# Patient Record
Sex: Male | Born: 1940 | Race: White | Hispanic: No | Marital: Married | Smoking: Former smoker
Health system: Southern US, Community
[De-identification: ages and names within clinical notes are randomized; demographics above are authoritative.]

## PROBLEM LIST (undated history)

## (undated) DIAGNOSIS — J3489 Other specified disorders of nose and nasal sinuses: Secondary | ICD-10-CM

## (undated) DIAGNOSIS — I639 Cerebral infarction, unspecified: Secondary | ICD-10-CM

## (undated) DIAGNOSIS — E119 Type 2 diabetes mellitus without complications: Secondary | ICD-10-CM

## (undated) DIAGNOSIS — H9190 Unspecified hearing loss, unspecified ear: Secondary | ICD-10-CM

## (undated) DIAGNOSIS — M75100 Unspecified rotator cuff tear or rupture of unspecified shoulder, not specified as traumatic: Secondary | ICD-10-CM

## (undated) DIAGNOSIS — I10 Essential (primary) hypertension: Secondary | ICD-10-CM

## (undated) DIAGNOSIS — I779 Disorder of arteries and arterioles, unspecified: Secondary | ICD-10-CM

## (undated) DIAGNOSIS — Z950 Presence of cardiac pacemaker: Secondary | ICD-10-CM

## (undated) DIAGNOSIS — D649 Anemia, unspecified: Secondary | ICD-10-CM

## (undated) DIAGNOSIS — H53453 Other localized visual field defect, bilateral: Secondary | ICD-10-CM

## (undated) DIAGNOSIS — I509 Heart failure, unspecified: Secondary | ICD-10-CM

## (undated) DIAGNOSIS — M1990 Unspecified osteoarthritis, unspecified site: Secondary | ICD-10-CM

## (undated) DIAGNOSIS — R351 Nocturia: Secondary | ICD-10-CM

## (undated) DIAGNOSIS — E785 Hyperlipidemia, unspecified: Secondary | ICD-10-CM

## (undated) DIAGNOSIS — I2510 Atherosclerotic heart disease of native coronary artery without angina pectoris: Secondary | ICD-10-CM

## (undated) DIAGNOSIS — I4821 Permanent atrial fibrillation: Secondary | ICD-10-CM

## (undated) DIAGNOSIS — E669 Obesity, unspecified: Secondary | ICD-10-CM

## (undated) DIAGNOSIS — I350 Nonrheumatic aortic (valve) stenosis: Secondary | ICD-10-CM

## (undated) DIAGNOSIS — I739 Peripheral vascular disease, unspecified: Secondary | ICD-10-CM

## (undated) DIAGNOSIS — R350 Frequency of micturition: Secondary | ICD-10-CM

## (undated) HISTORY — DX: Peripheral vascular disease, unspecified: I73.9

## (undated) HISTORY — DX: Unspecified rotator cuff tear or rupture of unspecified shoulder, not specified as traumatic: M75.100

## (undated) HISTORY — PX: LUMBAR DISC SURGERY: SHX700

## (undated) HISTORY — PX: OTHER SURGICAL HISTORY: SHX169

## (undated) HISTORY — DX: Disorder of arteries and arterioles, unspecified: I77.9

## (undated) HISTORY — DX: Cerebral infarction, unspecified: I63.9

## (undated) HISTORY — DX: Other localized visual field defect, bilateral: H53.453

## (undated) HISTORY — DX: Unspecified osteoarthritis, unspecified site: M19.90

## (undated) HISTORY — PX: CARPAL TUNNEL RELEASE: SHX101

## (undated) HISTORY — PX: BACK SURGERY: SHX140

## (undated) HISTORY — DX: Hyperlipidemia, unspecified: E78.5

## (undated) HISTORY — DX: Obesity, unspecified: E66.9

## (undated) HISTORY — DX: Unspecified hearing loss, unspecified ear: H91.90

## (undated) HISTORY — DX: Essential (primary) hypertension: I10

## (undated) HISTORY — DX: Atherosclerotic heart disease of native coronary artery without angina pectoris: I25.10

---

## 2003-03-25 HISTORY — PX: CORONARY ARTERY BYPASS GRAFT: SHX141

## 2005-11-29 ENCOUNTER — Inpatient Hospital Stay (HOSPITAL_BASED_OUTPATIENT_CLINIC_OR_DEPARTMENT_OTHER): Admission: RE | Admit: 2005-11-29 | Discharge: 2005-11-29 | Payer: Self-pay | Admitting: Cardiology

## 2005-11-29 HISTORY — PX: CARDIAC CATHETERIZATION: SHX172

## 2006-12-18 ENCOUNTER — Ambulatory Visit: Payer: Self-pay | Admitting: Vascular Surgery

## 2007-06-06 ENCOUNTER — Ambulatory Visit: Payer: Self-pay | Admitting: Vascular Surgery

## 2007-12-19 ENCOUNTER — Ambulatory Visit: Payer: Self-pay | Admitting: Vascular Surgery

## 2007-12-26 ENCOUNTER — Ambulatory Visit: Payer: Self-pay | Admitting: Vascular Surgery

## 2008-01-29 ENCOUNTER — Inpatient Hospital Stay (HOSPITAL_COMMUNITY): Admission: RE | Admit: 2008-01-29 | Discharge: 2008-01-30 | Payer: Self-pay | Admitting: Vascular Surgery

## 2008-01-29 ENCOUNTER — Ambulatory Visit: Payer: Self-pay | Admitting: Vascular Surgery

## 2008-01-29 ENCOUNTER — Encounter: Payer: Self-pay | Admitting: Vascular Surgery

## 2008-01-29 HISTORY — PX: CAROTID ENDARTERECTOMY: SUR193

## 2008-02-20 ENCOUNTER — Ambulatory Visit: Payer: Self-pay | Admitting: Vascular Surgery

## 2008-08-20 ENCOUNTER — Ambulatory Visit: Payer: Self-pay | Admitting: Vascular Surgery

## 2009-02-11 ENCOUNTER — Ambulatory Visit: Payer: Self-pay | Admitting: Vascular Surgery

## 2009-09-23 ENCOUNTER — Ambulatory Visit: Payer: Self-pay | Admitting: Vascular Surgery

## 2010-01-05 ENCOUNTER — Ambulatory Visit: Payer: Self-pay | Admitting: Cardiology

## 2010-01-24 DIAGNOSIS — M75100 Unspecified rotator cuff tear or rupture of unspecified shoulder, not specified as traumatic: Secondary | ICD-10-CM

## 2010-01-24 HISTORY — DX: Unspecified rotator cuff tear or rupture of unspecified shoulder, not specified as traumatic: M75.100

## 2010-03-18 ENCOUNTER — Other Ambulatory Visit (INDEPENDENT_AMBULATORY_CARE_PROVIDER_SITE_OTHER): Payer: Medicare Other

## 2010-03-18 ENCOUNTER — Ambulatory Visit (INDEPENDENT_AMBULATORY_CARE_PROVIDER_SITE_OTHER): Payer: Medicare Other | Admitting: Vascular Surgery

## 2010-03-18 DIAGNOSIS — Z48812 Encounter for surgical aftercare following surgery on the circulatory system: Secondary | ICD-10-CM

## 2010-03-18 DIAGNOSIS — I6529 Occlusion and stenosis of unspecified carotid artery: Secondary | ICD-10-CM

## 2010-03-19 NOTE — Assessment & Plan Note (Signed)
OFFICE VISIT  Evan [name], Evan [name] DOB:  09-03-1940                                       03/18/2010 CHART#:[id number]  The patient is a 70 year old male who returns for further followup after previous carotid endarterectomy.  His carotid was done in January of 2010.  He is currently on aspirin antiplatelet therapy.  CHRONIC MEDICAL PROBLEMS:  Also include elevated cholesterol, hypertension, diabetes and coronary artery disease.  He reports recently he has been having some polyuria and has been working on trying to get his diabetes under better control.  He denies any symptoms of TIA, amaurosis or stroke.  However, he states that sometimes when he is asked a question it takes him a few minutes to come up with an answer.  He denies any slurred speech.  PAST SURGICAL HISTORY:  Right carotid endarterectomy, coronary artery bypass grafting and back surgery.  SOCIAL HISTORY:  He is retired.  He is married.  He has 5 children.  He is a former smoker, quit in 1996.  He does not consume alcohol regularly.  FAMILY HISTORY:  His father had a heart attack.  REVIEW OF SYSTEMS:  GENERAL:  He is 6 feet 1 inch, 270 pounds. CARDIAC:  He has occasional chest pain, shortness of breath, palpitations.  MUSCULOSKELETAL:  He has multiple joint and arthritis pain. ENT:  He has had some decline in his hearing recently. URINARY:  He has some occasional polyuria and occasional frequency. Neurologic, pulmonary, vascular, hematologic, psychiatric, skin and GI review of systems are otherwise negative.  MEDICATIONS: 1. Include simvastatin 40 mg once a day. 2. Metoprolol 200 mg half tablet once a day. 3. Niaspan ER 500 mg q.h.s. 4. Quinapril 40 mg once a day. 5. Aspirin 81 mg once a day. 6. Replenix once daily. 7. Hydrochlorothiazide 12.5 once daily. 8. Claritin as needed. 9. Zicam as needed.  ALLERGIES:  He has no known drug allergies.  PHYSICAL EXAM:  Vital signs:  Blood  pressure is 146/76 in the right arm, 187/74 in the left arm, oxygen saturation is 98% on room air, heart rate 60 and regular.  HEENT:  Unremarkable.  Neck:  Has 2+ carotid pulses with a bruit on the left side.  Chest:  Clear to auscultation.  Cardiac: Regular rate and rhythm without murmur.  Abdomen:  Soft, nontender, nondistended, obese.  Musculoskeletal:  Showed no major joint deformities.  Neurologic:  Shows symmetric upper extremity and lower extremity motor strength which is 5/5.  Skin:  Has no open ulcers or rashes.  He had a carotid duplex exam performed today which shows a widely patent right carotid artery.  He has a 60%-80% left internal carotid artery. He has retrograde right vertebral artery flow.  He has antegrade left vertebral artery flow.  This suggests that he may have some element of right subclavian stenosis.  In summary, the patient has a moderate left carotid stenosis which again remains asymptomatic.  I believe we should continue to follow this with serial duplex exam.  He also has evidence of right subclavian stenosis which is also asymptomatic but probably he should always have the blood pressure measured in his left arm currently because this will be the most accurate reading.  Overall the patient is doing well.  Hopefully he will continue to not have progression of his peripheral arterial disease.  He will follow  up with [place] with a repeat carotid duplex exam in 6 months' time.    Evan [name]. Evan Matters, MD Electronically Signed  CEF/MEDQ  D:  03/18/2010  T:  03/19/2010  Job:  [id number]  cc:   Evan [name], M.D. Dr Evan [name]

## 2010-03-26 NOTE — Procedures (Unsigned)
CAROTID DUPLEX EXAM  INDICATION:  Carotid stenosis.  HISTORY: Diabetes:  No. Cardiac:  CABG. Hypertension:  Yes. Smoking:  Quit in 1996. Previous Surgery:  Right carotid endarterectomy, January 29, 2008. CV History:  Currently asymptomatic. Amaurosis Fugax No, Paresthesias No, Hemiparesis No.                                      RIGHT             LEFT Brachial systolic pressure:         125               142 Brachial Doppler waveforms:         Normal            Normal Vertebral direction of flow:        Retrograde        Antegrade DUPLEX VELOCITIES (cm/sec) CCA peak systolic                   101               101 ECA peak systolic                   118               205 ICA peak systolic                   85                236 ICA end diastolic                   30                72 PLAQUE MORPHOLOGY:                  Heterogenous      Calcific PLAQUE AMOUNT:                      Minimal           Moderate PLAQUE LOCATION:                    CCA               CCA, ICA, ECA  IMPRESSION: 1. Patent right carotid endarterectomy site with no evidence of     restenosis of the internal carotid artery. 2. Left internal carotid artery velocities suggest 60% to 79%     stenosis. 3. Retrograde right vertebral artery.  ___________________________________________ Janetta Hora Darrick Penna, MD  EM/MEDQ  D:  03/18/2010  T:  03/18/2010  Job:  [id number]

## 2010-05-10 LAB — CBC
Hemoglobin: 13.6 g/dL (ref 13.0–17.0)
MCHC: 32.6 g/dL (ref 30.0–36.0)
MCHC: 32.8 g/dL (ref 30.0–36.0)
MCV: 87.7 fL (ref 78.0–100.0)
MCV: 88.1 fL (ref 78.0–100.0)
Platelets: 178 10*3/uL (ref 150–400)
RBC: 3.84 MIL/uL — ABNORMAL LOW (ref 4.22–5.81)
RBC: 4.75 MIL/uL (ref 4.22–5.81)
WBC: 8.6 10*3/uL (ref 4.0–10.5)

## 2010-05-10 LAB — GLUCOSE, CAPILLARY
Glucose-Capillary: 141 mg/dL — ABNORMAL HIGH (ref 70–99)
Glucose-Capillary: 145 mg/dL — ABNORMAL HIGH (ref 70–99)

## 2010-05-10 LAB — BASIC METABOLIC PANEL
BUN: 17 mg/dL (ref 6–23)
CO2: 25 mEq/L (ref 19–32)
Calcium: 8.2 mg/dL — ABNORMAL LOW (ref 8.4–10.5)
Chloride: 102 mEq/L (ref 96–112)
Creatinine, Ser: 1.04 mg/dL (ref 0.4–1.5)
GFR calc Af Amer: >60 mL/min (ref >60)

## 2010-06-08 NOTE — Discharge Summary (Signed)
Evan [name], Evan [name] NO.:  [id number]   MEDICAL RECORD NO.:  [id number]          PATIENT TYPE:  INP   LOCATION:  3312                         FACILITY:  [facility]   PHYSICIAN:  Janetta Hora. Fields, MD  DATE OF BIRTH:  Jul 28, 1940   DATE OF ADMISSION:  01/29/2008  DATE OF DISCHARGE:  01/30/2008                               DISCHARGE SUMMARY   DISCHARGE DIAGNOSES:  1. Right internal carotid artery stenosis.  2. Hypertension.  3. Dyslipidemia.   PROCEDURE PERFORMED:  Right carotid endarterectomy with 10-French shunt  and Dacron patch angioplasty closure by Dr. Darrick Penna on January 29, 2008.   COMPLICATIONS:  None.   CONDITION AT DISCHARGE:  Stable improving.   DISCHARGE MEDICATIONS:  1. Simvastatin 40 mg p.o. daily.  2. Metoprolol 200 mg one-half tablet p.o. daily.  3. Niaspan ER 500 mg p.o. nightly.  4. Quinapril 40 mg p.o. daily.  5. Aspirin 81 mg p.o. daily.  6. Replenex one p.o. daily.  7. Hydrochlorothiazide 12.5 mg p.o. daily.  8. Claritin p.r.n.  9. Zicam p.r.n.  10.Zinc lozenges p.r.n.  11.He was given a prescription for Percocet 5/325 one p.o. q.4 h.      p.r.n., pain.   DISPOSITION:  He has been discharged home in stable condition with his  wounds healing well.  He has given careful instructions regarding the  care of his wounds.  He was given an appointment with Dr. Darrick Penna in the  next 2-3 weeks.  The office will make the arrangements for the visit.   BRIEF IDENTIFYING STATEMENT:  For complete details, please refer the  typed history and physical.   Briefly, this is a very pleasant 70 year old gentleman who was referred  to Dr. Darrick Penna for right internal carotid artery stenosis.  Dr. Darrick Penna  evaluated him and found him to be a suitable candidate for right carotid  endarterectomy.  He was informed of the risk and benefits of the  procedure and after careful consideration, elected to proceed with  surgery.   HOSPITAL COURSE:  Preoperative workup was  completed as an outpatient.  He was brought in through same-day surgery and underwent the  aforementioned right carotid endarterectomy.  For complete details,  please refer to the typed operative report.  The procedure was without  complications.  He was returned to the post-anesthesia care unit  extubated.  Following stabilization, he was transferred to a bed in a  surgical step-down unit.  He was observed  overnight.  The following morning, he was nonfocal.  He was desirous of  discharge.  His drainage output had diminished and negligible.  He did  have some elevated oxygen demand, which improved over the course of the  day and he was subsequently discharged home in stable condition.      Wilmon Arms, PA      Janetta Hora. Fields, MD  Electronically Signed    KEL/MEDQ  D:  01/30/2008  T:  01/30/2008  Job:  [id number]   cc:   Dr. Darrick Penna

## 2010-06-08 NOTE — Op Note (Signed)
Evan [name], Evan [name] NO.:  [id number]   MEDICAL RECORD NO.:  [id number]          PATIENT TYPE:  INP   LOCATION:  3312                         FACILITY:  [facility]   PHYSICIAN:  Janetta Hora. Fields, MD  DATE OF BIRTH:  08-12-1940   DATE OF PROCEDURE:  01/29/2008  DATE OF DISCHARGE:                               OPERATIVE REPORT   PROCEDURE:  Right carotid endarterectomy.   PREOPERATIVE DIAGNOSIS:  Right internal carotid artery stenosis.   POSTOPERATIVE DIAGNOSIS:  Right internal carotid artery stenosis.   ANESTHESIA:  General.   ASSISTANT:  Wilmon Arms, PA-C   FINDINGS:  1. Extensive atherosclerotic occlusive disease from right common      carotid artery up in the common carotid artery with 50% right      common carotid artery stenosis and greater than 80% right internal      carotid artery stenosis.  2. Dacron patch.  3. A 10-French shunt.   DESCRIPTION OF PROCEDURE:  After obtaining informed consent, the patient  was taken to the operating room.  The patient was placed in supine  position on the operating table.  After induction of general anesthesia  and endotracheal intubation, the patient's right neck and chest were  prepped and draped in usual sterile fashion.  An oblique incision was  made on the right side of the neck and carried down through subcutaneous  tissues down to the level of the platysma.  The platysma was incised  with full length of the incision.  Dissection was carried down along the  anterior border of the right sternocleidomastoid muscle.  This was  reflected laterally.  The dissection was then carried deeper down to the  level of the internal jugular vein.  This was dissected free along its  medial border and reflected laterally.  Common facial vein was  identified, dissected free circumferentially, and ligated and divided  between silk ties.  Common carotid artery was then identified and  dissected free circumferentially.  It was  slightly thickened in  character.  Dissection was carried up to the level of carotid  bifurcation.  There was thickened plaque at the bifurcation.  The  external carotid artery and superior thyroid artery was dissected free  circumferentially and vessel loops placed around these.  Dissection was  then carried up to level of the internal carotid artery.  This was  carried up to the level of hypoglossal nerve and the artery became  softer in character in this location.  Next, a vessel loop was placed  around the distal internal carotid artery.  A Rumel tourniquet was  placed around the proximal common carotid artery.  The patient was then  given 7000 units of intravenous heparin.  The patient was also given an  additional dose 5000 units of heparin during the case.  Next, the distal  internal carotid was controlled with a fine bulldog clamp.  The external  carotid artery and superior thyroid artery was controlled with vessel  loops.  Common carotid artery was occluded proximally with peripheral  DeBakey clamp.  However, at this point  there was still pulsatile flow in  the common carotid artery.  This was again palpated.  There was found to  be a plaque in the distal common carotid artery so the clamp was moved  down further on the common carotid artery after un-clamping everything  again.  Dissection was carried down below the level of the omohyoid  muscle and this was dissected free and split through its entirety.  Next, the internal carotid was then reoccluded with a fine bulldog  clamp.  The external carotid and superior thyroid arteries were  reoccluded with a vessel loop.  The common carotid artery was then  controlled with peripheral DeBakey clamp.  It was then occlusive at this  point.  Longitudinal opening was made in the common carotid artery.  There was 50% stenosis in the common carotid proximally 4 cm below the  carotid bifurcation.  The arteriotomy was extended superiorly up  through  the carotid bifurcation.  There was an 80% internal carotid artery  stenosis.  Dissection was carried through this plaque all the way up to  normal-appearing carotid artery in the distal most segment.  A 10-French  shunt was then brought up in the operative field and threaded in the  distal internal carotid artery and allowed to back bleed thoroughly.  It  was then threaded in the common carotid artery and secured with a Rumel  tourniquet.  Flow was then restored to the brain after approximately 6  minutes of ischemia time.  Next, dissection was begun in a suitable  plane in the common carotid artery.  Endarterectomy was carried down  below the carotid bifurcation for several centimeters.  A reasonable  proximal endpoint was obtained and all the exophytic plaque was removed.  The external carotid artery was endarterectomized by eversion technique.  The distal internal carotid was also endarterectomized and a good  endpoint was obtained.  Next, all loose debris was removed from the  carotid artery.  A long Dacron patch was then brought up on the  operative field.  This was then sewn on as patch angioplasty using a  running 6-0 Prolene suture.  The entire length of the patch was  approximately 7 cm to 8 cm in length.  Approximately 3 cm of this  extended up on the internal carotid artery and remainder of it was on  the common carotid artery.  Just prior to completion of anastomosis,  everything was fore bled, back bled, and thoroughly flushed.  Just  before completion, the shunt was reoccluded with a straight hemostat.  It was then pulled down out of the distal internal carotid artery and  allowed to back bleed thoroughly.  This was then pulled out of the  proximal common carotid artery and allowed to forward bleed thoroughly.  Everything was thoroughly irrigated with heparinized saline.  The  remainder of the anastomosis was then completed.  Flow was then first  restored at the  external carotid and superior thyroid arteries after  opening the common carotid artery.  Flow was then restored to the  internal carotid artery.  A few repair sutures were made near the distal  portion of the patch.  Hemostasis was then obtained.  The patient was  also given 50 mg of protamine.  Thrombin and Gelfoam was also applied.  Thrombin and Gelfoam was then all removed and the wound was hemostatic.  A 10 flat Jackson-Pratt drain was brought out through separate stab  incision and sutured to skin with nylon  suture.  The platysma was then  reapproximated using running 3-0 Vicryl suture.  The skin was closed  with 4-0 Vicryl subcuticular stitch.  The patient tolerated the  procedure well and there were no complications.  Sponge and needle  counts correct at the end of the case.  The patient was taken to  recovery room in stable condition.  Upper extremity and lower extremity  motor strength was 5/5 and symmetric at the end of the case.  The  patient's tongue was also midline.      Janetta Hora. Fields, MD  Electronically Signed     CEF/MEDQ  D:  01/29/2008  T:  01/30/2008  Job:  [id number]

## 2010-06-08 NOTE — Procedures (Signed)
CAROTID DUPLEX EXAM   INDICATION:  Follow up known carotid artery disease.   HISTORY:  Diabetes:  Yes.  Cardiac:  CABG.  Hypertension:  Yes.  Smoking:  No.  Previous Surgery:  CV History:  Amaurosis Fugax No, Paresthesias No, Hemiparesis No.                                       RIGHT             LEFT  Brachial systolic pressure:         150               150  Brachial Doppler waveforms:         Monophasic        Biphasic  Vertebral direction of flow:        Retrograde        Antegrade  DUPLEX VELOCITIES (cm/sec)  CCA peak systolic                   120               110  ECA peak systolic                   225               183  ICA peak systolic                   435               311  ICA end diastolic                   128               87  PLAQUE MORPHOLOGY:                  Calcified         Calcified  PLAQUE AMOUNT:                      Severe            Moderate-to-severe  PLAQUE LOCATION:                    ICA, ECA          ICA, ECA   IMPRESSION:  1. 80-99% stenosis noted in the right internal carotid artery.  2. 60-79% stenosis noted in the left internal carotid artery.  3. Antegrade left vertebral arteries.   ___________________________________________  Janetta Hora Fields, MD   MG/MEDQ  D:  12/19/2007  T:  12/19/2007  Job:  [id number]

## 2010-06-08 NOTE — Procedures (Signed)
CAROTID DUPLEX EXAM   INDICATION:  Followup known carotid artery disease.   HISTORY:  Diabetes:  Yes.  Cardiac:  CABG.  Hypertension:  Yes.  Smoking:  No.  Previous Surgery:  No.  CV History:  No.  Amaurosis Fugax:  No, Paresthesias:  No, Hemiparesis:  No                                       RIGHT             LEFT  Brachial systolic pressure:         140               150  Brachial Doppler waveforms:         Biphasic          Biphasic  Vertebral direction of flow:        Not well-visualized                 Antegrade  DUPLEX VELOCITIES (cm/sec)  CCA peak systolic                   115               100  ECA peak systolic                   288               162  ICA peak systolic                   328               254  ICA end diastolic                   95                70  PLAQUE MORPHOLOGY:                  Heterogenous      Heterogeneous  PLAQUE AMOUNT:                      Moderate to severe                  Moderate to severe  PLAQUE LOCATION:                    ICA and ECA       ICA and ECA   IMPRESSION:  60% to 79% stenosis noted in bilateral internal carotid  arteries.  Right is greater than left.  Antegrade bilateral vertebral  arteries.   ___________________________________________  Janetta Hora Fields, MD   MG/MEDQ  D:  12/18/2006  T:  12/19/2006  Job:  [id number]

## 2010-06-08 NOTE — Procedures (Signed)
CAROTID DUPLEX EXAM   INDICATION:  Follow up evaluation of known carotid artery disease   HISTORY:  Diabetes:  Yes  Cardiac:  CABG  Hypertension:  Yes  Smoking:  Quit 15 years  Previous Surgery:  Right carotid endarterectomy 01/29/2008 by Dr. Darrick Penna  CV History:  No  Amaurosis Fugax No, Paresthesias No, Hemiparesis No                                       RIGHT             LEFT  Brachial systolic pressure:         144               156  Brachial Doppler waveforms:         biphasic          biphasic  Vertebral direction of flow:        retrograde        antegrade  DUPLEX VELOCITIES (cm/sec)  CCA peak systolic                   107               96  ECA peak systolic                   180               130  ICA peak systolic                   77                215  ICA end diastolic                   25                74  PLAQUE MORPHOLOGY:                  none              calcified  PLAQUE AMOUNT:                      None              moderate to severe  PLAQUE LOCATION:                    none              ICA and ECA   IMPRESSION:  1. Patent right internal carotid artery with no evidence of      restenosis.  2. 60-79% left internal carotid artery stenosis.         ___________________________________________  Janetta Hora Fields, MD   AC/MEDQ  D:  08/20/2008  T:  08/21/2008  Job:  [id number]

## 2010-06-08 NOTE — Procedures (Signed)
CAROTID DUPLEX EXAM   INDICATION:  Follow-up evaluation of known carotid artery disease.   HISTORY:  Diabetes:  Yes.  Cardiac:  CABG.  Hypertension:  Yes.  Smoking:  Quit 50 years ago.  Previous Surgery:  Right carotid endarterectomy on 01/29/2008.  CV History:  Asymptomatic.                                       RIGHT             LEFT  Brachial systolic pressure:         130               155  Brachial Doppler waveforms:         Within normal limits                Within normal limits  Vertebral direction of flow:        Retrograde        Antegrade  DUPLEX VELOCITIES (cm/sec)  CCA peak systolic                   125               99  ECA peak systolic                   128               193  ICA peak systolic                   110               295  ICA end diastolic                   36                104  PLAQUE MORPHOLOGY:                  Heterogenous      Calcific  PLAQUE AMOUNT:                      Minimal           Moderate  PLAQUE LOCATION:                    CCA               ICA   IMPRESSION:  1. The right internal carotid artery appears with no restenosis status      post right carotid endarterectomy.  2. Left internal carotid artery suggestive of 60% to 79% stenosis.  3. Retrograde right vertebral artery.   ___________________________________________  Janetta Hora Fields, MD   EM/MEDQ  D:  09/23/2009  T:  09/23/2009  Job:  [id number]

## 2010-06-08 NOTE — Procedures (Signed)
CAROTID DUPLEX EXAM   INDICATION:  Follow-up evaluation of known carotid artery disease.   HISTORY:  Diabetes:  Yes.  Cardiac:  History of coronary artery bypass graft.  Hypertension:  Yes.  Smoking:  No.  Previous Surgery:  No.  CV History:  Patient reports no cerebrovascular symptoms at this time.  Previous duplex performed on 12/22/06 revealed 60-79% ICA stenosis  bilaterally.  Amaurosis Fugax No, Paresthesias No, Hemiparesis No                                       RIGHT             LEFT  Brachial systolic pressure:         118               140  Brachial Doppler waveforms:         Biphasic          Triphasic  Vertebral direction of flow:        Retrograde        Antegrade  DUPLEX VELOCITIES (cm/sec)  CCA peak systolic                   142               105  ECA peak systolic                   243               173  ICA peak systolic                   352               239  ICA end diastolic                   70                69  PLAQUE MORPHOLOGY:                  Mixed irregular   Mixed irregular  PLAQUE AMOUNT:                      Moderate-to-severe                  Moderate-to-severe  PLAQUE LOCATION:                    Proximal ICA, ECA Proximal ICA   IMPRESSION:  1. 60-79% internal carotid artery stenosis, right worse than left.  2. Right subclavian steal syndrome.  3. No significant change from previous study performed on 12/18/06.   ___________________________________________  Janetta Hora. Fields, MD   MC/MEDQ  D:  06/06/2007  T:  06/06/2007  Job:  [id number]

## 2010-06-08 NOTE — Assessment & Plan Note (Signed)
OFFICE VISIT   Evan [name], Evan [name]  DOB:  Mar 21, 1940                                       12/26/2007  CHART#:[id number]   The patient is a 70 year old male referred by Dr. [name] Swaziland for high-  grade asymptomatic carotid stenosis.  The patient denies any neurologic  symptoms.  He has had multiple carotid duplex exams in the past for  moderate carotid stenosis and has recently been developing more  progressive disease and has now fallen in the high-grade stenosis  category.  He denies any prior episodes of TIA or stroke.  His  atherosclerotic risk factors include hypertension, elevated cholesterol,  diabetes and coronary artery disease.  He previously had coronary artery  bypass grafting at [facility] in 2005 using left radial artery and bilateral  greater saphenous vein as conduit.   PAST MEDICAL HISTORY:  Otherwise is fairly unremarkable.   PAST SURGICAL HISTORY:  Is as listed above.   FAMILY HISTORY:  His father had a myocardial infarction at age 72.   SOCIAL HISTORY:  He is married.  He has five children and is a retired  tobacco former.  He is a former smoker but quit 10-12 years ago.  Unfortunately he still chews tobacco.  He is a Tax adviser of alcohol.   REVIEW OF SYSTEMS:  CONSTITUTIONAL:  He denies any recent weight loss or  gain.  He is obese.  Cardiac, pulmonary, GI, vascular, neurologic, orthopedic, psychiatric,  ENT and hematologic review of systems are all negative.  RENAL:  He has some urinary frequency and was recently started on Flomax  by Dr. Wanda Plump.   MEDICATIONS:  1. _________ complex.  2. Aspirin 81 mg once a day.  3. Niaspan ER 500 mg once a day.  4. Metoprolol ER 200 mg once a day.  5. Microzide 12.5 mg once a day.  6. Zocor 40 mg q.h.s.  7. Accupril 40 mg once a day.  8. Flomax once a day.   PHYSICAL EXAMINATION:  Vital signs:  Blood pressure is 162/74 in the  left arm and 149/73 in the right arm, heart rate is 66 and  regular.  HEENT:  Unremarkable.  Neck:  He has 2+ carotid pulses without bruit.  Chest:  Clear to auscultation.  Cardiac:  He has a 2/6 systolic murmur.  Abdomen:  Is obese, soft, nontender, nondistended.  No masses.  Vascular:  He has 2+ femoral, popliteal and dorsalis pedis pulses  bilaterally.  Neurological:  Exam shows symmetric upper extremity and  lower extremity motor strength which is 5/5.   Carotid duplex exam dated November 25 shows a high-grade right internal  carotid artery stenosis greater than 80% with a peak systolic velocity  of 435 cm/sec and end diastolic velocity of 128 cm/sec.  He has a  moderate left carotid stenosis which is 60-80%.  He has antegrade  vertebral flow bilaterally.  This has progressed from his previous  carotid duplex exam in May of 2009.  Anatomically speaking the carotid  bifurcation was at the mid hyaloid notch and there was normal carotid  past the level of stenosis.   In summary, the patient has a high-grade right internal carotid artery  stenosis and I believe he would benefit from carotid endarterectomy for  stroke prophylaxis.  Risks, benefits, possible complications and  procedure details were explained to the patient  and his wife today.  These were including but not limited to bleeding, infection, stroke risk  of 1-2%, cranial nerve injury of 5% which is usually transient in  nature, small risk of cardiac events.  We have scheduled him for his  right carotid endarterectomy during the first week of January as he  wanted to wait until after the holidays to have this done.  In the  meanwhile he will also be seen by Dr. Swaziland to review his cardiac  status to make sure that he needs no further testing prior to undergoing  carotid endarterectomy.   Janetta Hora. Fields, MD  Electronically Signed   CEF/MEDQ  D:  12/27/2007  T:  12/27/2007  Job:  [id number]   cc:   [name] M. Swaziland, M.D.  Conchita Paris

## 2010-06-08 NOTE — Procedures (Signed)
CAROTID DUPLEX EXAM   INDICATION:  Followup evaluation of known carotid artery disease.   HISTORY:  Diabetes:  Yes.  Cardiac:  CABG.  Hypertension:  Yes.  Smoking:  Quit 50 years ago.  Previous Surgery:  Right carotid endarterectomy 01/29/2008.  CV History:  No.  Amaurosis Fugax No, Paresthesias No, Hemiparesis No                                       RIGHT             LEFT  Brachial systolic pressure:         130               152  Brachial Doppler waveforms:         WNL               WNL  Vertebral direction of flow:        Retrograde        Antegrade  DUPLEX VELOCITIES (cm/sec)  CCA peak systolic                   120               121  ECA peak systolic                   172               191  ICA peak systolic                   104               235  ICA end diastolic                   34                67  PLAQUE MORPHOLOGY:                                    Heterogeneous  PLAQUE AMOUNT:                                        Moderate to severe  PLAQUE LOCATION:                                      ICA, ECA   IMPRESSION:  1. The right internal carotid artery appears with no restenosis status      post right carotid endarterectomy.  2. Left internal carotid artery suggests 60%-79% stenosis.  3. Retrograde flow in right vertebral.  4. Antegrade flow in left vertebral.   ___________________________________________  Janetta Hora. Fields, MD   CB/MEDQ  D:  02/11/2009  T:  02/11/2009  Job:  [id number]

## 2010-06-08 NOTE — Assessment & Plan Note (Signed)
OFFICE VISIT   Evan [name], Evan E.  DOB:  11-29-1940                                       02/20/2008  CHART#:[id number]   The patient returns for follow-up today.  He underwent a right carotid  endarterectomy in January 5.  He had an uneventful postoperative  recovery.  He has been home and continued to improve.  He has had no  neurologic symptoms.  He specifically denies any symptoms of TIA,  amaurosis or stroke.  His right neck incision is healing well.  He does  have some peripheral- incisional numbness but otherwise cranial nerve  testing is normal.  Tongue is midline.  Upper extremity and lower  extremity motor strength is 5/5 and symmetric bilaterally.   Blood pressure today was 159/71 in the left arm and 122/68 in the right  arm; pulse was 60 and regular.   Overall the patient seems to be recovering well from his carotid  endarterectomy.  He will continue his daily aspirin.  He will also  continue to manage his blood pressure and cholesterol for risk factor  modification and hopefully prevention of recurrent carotid disease.  He  will follow up with me and a carotid duplex exam in six months' time.   Janetta Hora. Fields, MD  Electronically Signed   CEF/MEDQ  D:  02/21/2008  T:  02/21/2008  Job:  [id number]   cc:   [name] M. Swaziland, M.D.  Conchita Paris

## 2010-06-11 NOTE — Cardiovascular Report (Signed)
[name], [name] NO.:  [id number]   MEDICAL RECORD NO.:  [id number]          PATIENT TYPE:  OIB   LOCATION:  1962                         FACILITY:  [facility]   PHYSICIAN:  [name] M. Swaziland, M.D.  DATE OF BIRTH:  12-20-1940   DATE OF PROCEDURE:  11/29/2005  DATE OF DISCHARGE:                              CARDIAC CATHETERIZATION   INDICATIONS FOR PROCEDURE:  Evan [name] is a 70 year old male with a known  history of coronary disease.  He had multiple previous catheter-based  interventions.  He subsequently underwent coronary bypass surgery in 2005,  at [facility].  A recent stress Cardiolite study showed evidence of anterolateral  ischemia.   PROCEDURE:  1. Left heart catheterization.  2. Coronary left ventricular angiography.  3. Saphenous vein graft angiography x2.  4. LIMA graft angiography.   ACCESS:  Via right femoral artery using standard Seldinger technique.   EQUIPMENT:  A 4-French 4-cm right and left Judkins catheters, 4-French  pigtail catheter, 4-French no toe catheter, 4-French IMA catheter, 4-French  arterial sheath.   CONTRAST:  Omnipaque 125 cc.   MEDICATIONS:  Local anesthesia 1% Xylocaine.   HEMODYNAMIC DATA:  1. Aortic pressures 161/69 with a mean of 103.  2. Left ventricle pressure is 161 with EDP of 20-mmHg.   ANGIOGRAPHIC DATA:  1. The left main coronary is heavily calcified.  It had only mild      irregularities less than 10%.  The left main coronary artery is very      short.  2. The left anterior descending artery is heavily calcified proximally.      It gives rise to high diagonal branch which is without significant      disease.  The LAD is diffusely disease proximally up to 80-90%.  This      is then occluded in the mid vessel.  3. The left circumflex coronary gives rise to a single marginal branch      which then bifurcates.  There is 70% stenosis in the mid circumflex.      It then continues as a small branch and AV groove.  The  first obtuse      marginal vessel has diffuse disease throughout the proximal vessel up      to 70-80%.  There is a smaller branch of the obtuse marginal vessel      which has diffuse 90% stenosis.  4. The right coronary arises and distributes normally.  It is diffusely      diseased.  There is a 90% stenosis in the mid vessel followed by a      diffuse segment of 60-7-% disease.  There appear to be stents in the      distal right coronary with moderate in-stent restenosis of 50-70%.  The      PDA and posterolateral branches fill via the vein graft.  5. There is a free radial graft to the first obtuse marginal vessel.  This      graft is widely patent with good runoff.  6. There is a saphenous vein graft that supplies the PDA.  This graft is      widely patent throughout with good runoff.  7. The left IMA graft to the LAD is widely patent with good distal runoff.   LEFT VENTRICULAR ANGIOGRAPHY:  Was performed in RAO view.  This demonstrates  normal left ventricular size and contractility with normal systolic  function.  Ejection fraction is estimated at 65%.  There is no mitral  regurgitation prolapse.   FINAL INTERPRETATION:  1. Severe three-vessel obstructive atherosclerotic coronary artery      disease.  2. All grafts were patent including saphenous vein graft to the prepped      and draped, radial artery graft to the obtuse marginal vessel and left      internal mammary artery graft to the left anterior descending artery.  3. Good left ventricular function.   PLAN:  Recommend continued medical therapy.           ______________________________  [name] M. Swaziland, M.D.     PMJ/MEDQ  D:  11/29/2005  T:  11/29/2005  Job:  [id number]

## 2010-06-11 NOTE — H&P (Signed)
NAMEALEKSEI, Evan [name] NO.:  [id number]   MEDICAL RECORD NO.:  [id number]           PATIENT TYPE:   LOCATION:                                 FACILITY:   PHYSICIAN:  [name] M. Swaziland, M.D.  DATE OF BIRTH:  05/17/1940   DATE OF ADMISSION:  11/29/2005  DATE OF DISCHARGE:                                HISTORY & PHYSICAL   HISTORY OF PRESENT ILLNESS:  Evan [name] is a 70 year old white male with  known history of coronary disease.  He was diagnosed initially in 1989 and  had multiple angioplasty performed over the years.  In March 2005 he had an  abnormal stress test and presented with increasing dyspnea.  He was told at  that time that angioplasty was not an option. He was referred for coronary  artery  bypass surgery which he had performed with Dr. Leanna Battles at [facility]. This  included three grafts with an LIMA graft to LAD and vein graft to the PDA.  The left radial artery graft of the first obtuse marginal vessel.  He had  did have to undergo repeat surgery for postoperative bleeding.  He also  developed postoperative atrial fibrillation. Since then he has resumed his  normal activities, has done very well.  He does a moderate amount of farm  work.  He does not do any regular exercise.  He states he is limited by his  activity due to pain in his legs.  He does have a history of  hypercholesterolemia and hypertension.  The patient subsequently underwent  stress Cardiolite study on 11/14/05.  This was an adenosine protocol.  This  demonstrated a moderate anterolateral defect consistent with ischemia.  He  had low normal left ventricular function with ejection fraction estimated at  51%.  There was septal hypokinesia.  Based on these findings it was  recommended he undergo cardiac catheterization to assess the adequacy of his  revascularization.   PAST MEDICAL HISTORY:  1. Coronary artery disease status post multiple coronary interventions.      Status post CABG March  2005.  2. Hyperlipidemia.  3. Obesity.  4. Hypertension.  5. Status post back surgery x2.  6. History of removal of several moles on his skin.   ALLERGIES:  He has no known allergies.   CURRENT MEDICATIONS:  1. Vytorin 10/40 mg per day.  2. Replenex once daily.  3. Quinapril 40 mg per day.  4. Toprol XL 100 mg per day.  5. Aspirin 81 mg per day.   SOCIAL HISTORY:  The patient has a 100 pack-year history of smoking.  He  quit in 1996.  He is married and has 5 children.  Denies alcohol use.  The  patient previously worked for [employer].   FAMILY HISTORY:  Father died age 63 of myocardial infarction.  Mother died  age 57 of unknown causes.  One brother died of heart attack.  One brother  died with Parkinson's disease and committed suicide.  One brother died of  cancer.  One brother died due to complications post gallbladder surgery.  He  has two sisters one of whom has a pacemaker. His review of systems is  otherwise unremarkable.  He does have known history of moderate bilateral  carotid disease.  He has had no TIA or CVA symptoms.   PHYSICAL EXAMINATION:  GENERAL:  The patient is a pleasant white male in no  distress.  VITALS:  Weight is 283. Blood pressure was 160/90, his pulse is 56 and  regular. Respirations were normal.  HEENT:  Exam is unremarkable.  NECK:  He has no JVD or bruits.  He does have bilateral carotid bruits.  He  also has bilateral subclavian bruits versus radiated murmur from the heart.  LUNGS:  Clear to auscultation and percussion.  CARDIAC:  Exam reveals regular rate and rhythm.  Normal S1-S2 with a grade 1-  2/6 systolic murmur heard best in the aortic area.  ABDOMEN:  Soft, nontender without masses or bruits.  EXTREMITIES:  Without edema.  Pulses were 2+ and symmetric.  NEUROLOGIC:  Exam is nonfocal.   LABORATORY DATA:  ECG at rest shows normal sinus rhythm, first-degree AV  block, left anterior fascicular block, a right bundle branch block.  Chest  x-  ray shows elevation of his left hemidiaphragm otherwise no active disease.  Coag's were normal.  Glucose 135, BUN 15, creatinine 1.1, sodium 140,  potassium 4.5, chloride 103, CO2 26. CBC is normal.   IMPRESSION:  1. Coronary artery disease status post CABG in March 2005.  Multiple prior      coronary interventions.  Now with abnormal Cardiolite study showing      evidence of anterolateral ischemia.  2. Hypercholesterolemia.  3. Hypertension.  4. Osteoarthritis.  5. Obesity.   PLAN:  The patient will undergo diagnostic cardiac catheterization with  further therapy pending these results.           ______________________________  [name] M. Swaziland, M.D.     PMJ/MEDQ  D:  11/28/2005  T:  11/29/2005  Job:  [id number]   cc:   Conchita Paris

## 2010-07-26 ENCOUNTER — Encounter: Payer: Self-pay | Admitting: Cardiology

## 2010-08-02 ENCOUNTER — Ambulatory Visit (INDEPENDENT_AMBULATORY_CARE_PROVIDER_SITE_OTHER): Payer: Medicare Other | Admitting: Cardiology

## 2010-08-02 ENCOUNTER — Encounter: Payer: Self-pay | Admitting: Cardiology

## 2010-08-02 DIAGNOSIS — I779 Disorder of arteries and arterioles, unspecified: Secondary | ICD-10-CM | POA: Insufficient documentation

## 2010-08-02 DIAGNOSIS — I2510 Atherosclerotic heart disease of native coronary artery without angina pectoris: Secondary | ICD-10-CM

## 2010-08-02 DIAGNOSIS — I10 Essential (primary) hypertension: Secondary | ICD-10-CM

## 2010-08-02 DIAGNOSIS — I160 Hypertensive urgency: Secondary | ICD-10-CM | POA: Insufficient documentation

## 2010-08-02 DIAGNOSIS — E669 Obesity, unspecified: Secondary | ICD-10-CM | POA: Insufficient documentation

## 2010-08-02 DIAGNOSIS — E785 Hyperlipidemia, unspecified: Secondary | ICD-10-CM

## 2010-08-02 NOTE — Assessment & Plan Note (Signed)
He remains asymptomatic at this point all medical therapy. I have encouraged him to lose weight and get more aerobic activity. I recommended he abstain from salt. He will continue on his current medications and we will follow up again in 6 months.

## 2010-08-02 NOTE — Assessment & Plan Note (Signed)
His lipids look excellent on combination of Zocor and niacin. He will continue.

## 2010-08-02 NOTE — Assessment & Plan Note (Signed)
I stressed the importance of sodium restriction to his treatment.

## 2010-08-02 NOTE — Progress Notes (Signed)
   Evan Evan [name] Date of Birth: 12-29-1940   History of Present Illness: Evan Evan [name] is seen today for followup. He states he has been doing very well. He denies any significant chest pain or shortness of breath. He admits that he doesn't get a lot of exercise. He does do some yard work for a couple of hours each day, however. He has been fairly liberal with his salt intake.  Current Outpatient Prescriptions on File Prior to Visit  Medication Sig Dispense Refill  . aspirin 81 MG tablet Take 81 mg by mouth daily.        . hydrochlorothiazide (,MICROZIDE/HYDRODIURIL,) 12.5 MG capsule Take 12.5 mg by mouth daily.        Marland Kitchen ibuprofen (ADVIL,MOTRIN) 200 MG tablet Take 200 mg by mouth every 6 (six) hours as needed.        . loratadine (CLARITIN) 10 MG tablet Take 10 mg by mouth as needed.        . metoprolol (TOPROL-XL) 100 MG 24 hr tablet Take 100 mg by mouth daily.        . niacin (NIASPAN) 500 MG CR tablet Take 500 mg by mouth at bedtime.        . quinapril (ACCUPRIL) 40 MG tablet Take 40 mg by mouth at bedtime.        . simvastatin (ZOCOR) 40 MG tablet Take 40 mg by mouth at bedtime.          No Known Allergies  Past Medical History  Diagnosis Date  . Coronary artery disease   . Hyperlipidemia   . Hypertension   . Obesity   . Carotid arterial disease   . Dyspnea     Past Surgical History  Procedure Date  . Carotid endarterectomy   . Back surgery     X2  . Mole surgery   . Coronary artery bypass graft 03/2003    X3. LIMA GRAFT TO THE LAD, SAPHENOUS VEIN GRAFT TO THE PA, AND A LEFT RADIAL GRAFT TO THEOBTUSE MARGINAL VESSEL  . Cardiac catheterization 11/29/2005    SEVERE THREE VESSEL OBSTRUCTIVE ATHERSCLEROTIC CAD. ALL GRAFTS WERE PATENT. EF 65%    History  Smoking status  . Former Smoker  . Quit date: 01/24/1994  Smokeless tobacco  . Not on file    History  Alcohol Use No    Family History  Problem Relation Age of Onset  . Heart attack Father   . Heart attack  Brother   . Parkinsonism Brother   . Cancer Brother   . Arrhythmia Brother     Review of Systems:   All other systems were reviewed and are negative.  Physical Exam: BP 116/70  Evan [name] 61  Ht 6' (1.829 m)  Wt 274 lb 9.6 oz (124.558 kg)  BMI 37.24 kg/m2 He is an obese white male in no acute distress. He is normocephalic, atraumatic. Pupils equal round react to light accommodation. Extraocular movements are full. Oropharynx is clear. Neck is without JVD, adenopathy, thyromegaly, or bruits. Lungs are clear. Cardiac exam reveals a grade 2/6 systolic ejection murmur the right upper sternal border radiating to the left sternal border. There is no diastolic murmur or S3. Abdomen is obese, soft, nontender. He has no edema. Pedal pulses are good. LABORATORY DATA: Date April 13, 2010 his A1c was 6.9%. Glucose was 164. All other chemistries and CBC were normal. Cholesterol 129, triglycerides 104, HDL 37, and LDL 70.8.  Assessment / Plan:

## 2010-08-02 NOTE — Patient Instructions (Signed)
Continue your current medications.  Avoid salt.  I will see you back in 6 months.

## 2010-09-16 ENCOUNTER — Other Ambulatory Visit (INDEPENDENT_AMBULATORY_CARE_PROVIDER_SITE_OTHER): Payer: Medicare Other

## 2010-09-16 DIAGNOSIS — Z48812 Encounter for surgical aftercare following surgery on the circulatory system: Secondary | ICD-10-CM

## 2010-09-16 DIAGNOSIS — I6529 Occlusion and stenosis of unspecified carotid artery: Secondary | ICD-10-CM

## 2010-09-24 NOTE — Procedures (Unsigned)
CAROTID DUPLEX EXAM  INDICATION:  Followup carotid stenosis.  HISTORY: Diabetes:  No Cardiac:  CABG Hypertension:  Yes Smoking:  Previous Previous Surgery:  Right carotid endarterectomy on 01/29/2008 CV History:  Asymptomatic Amaurosis Fugax No, Paresthesias No, Hemiparesis No                                      RIGHT             LEFT Brachial systolic pressure:         124               164 Brachial Doppler waveforms:         Biphasic          WNL Vertebral direction of flow:        Retrograde        Antegrade DUPLEX VELOCITIES (cm/sec) CCA peak systolic                   97                106 ECA peak systolic                   123               159 ICA peak systolic                   72                281 ICA end diastolic                   20                81 PLAQUE MORPHOLOGY:                  Heterogeneous     Calcified PLAQUE AMOUNT:                      Minimal           Moderate to severe PLAQUE LOCATION:                    CCA               CCA, ICA, ECA  IMPRESSION: 1. Bilateral common carotid artery disease present. 2. Right internal carotid artery patent, with history of     endarterectomy. 3. Left internal carotid artery stenosis in the 60% to 79% range. 4. Left external carotid artery stenosis present. 5. Left vertebral artery is retrograde with a difference in blood     pressure suggesting subclavian steal syndrome. 6. Essentially unchanged since study on 03/18/2010.  ___________________________________________ Janetta Hora. Fields, MD  SH/MEDQ  D:  09/16/2010  T:  09/16/2010  Job:  [id number]

## 2010-10-05 ENCOUNTER — Other Ambulatory Visit: Payer: Self-pay | Admitting: Lab

## 2010-10-05 DIAGNOSIS — I6529 Occlusion and stenosis of unspecified carotid artery: Secondary | ICD-10-CM

## 2010-10-05 DIAGNOSIS — Z48812 Encounter for surgical aftercare following surgery on the circulatory system: Secondary | ICD-10-CM

## 2010-10-29 LAB — COMPREHENSIVE METABOLIC PANEL
ALT: 21 U/L (ref 0–53)
AST: 28 U/L (ref 0–37)
Albumin: 4 g/dL (ref 3.5–5.2)
Alkaline Phosphatase: 60 U/L (ref 39–117)
Chloride: 100 mEq/L (ref 96–112)
GFR calc Af Amer: >60 mL/min (ref >60)
Potassium: 4.1 mEq/L (ref 3.5–5.1)
Sodium: 137 mEq/L (ref 135–145)
Total Bilirubin: 1.1 mg/dL (ref 0.3–1.2)
Total Protein: 6.7 g/dL (ref 6.0–8.3)

## 2010-10-29 LAB — CBC
HCT: 40.9 % (ref 39.0–52.0)
Platelets: 199 10*3/uL (ref 150–400)
RDW: 13.4 % (ref 11.5–15.5)
WBC: 7.5 10*3/uL (ref 4.0–10.5)

## 2010-10-29 LAB — TYPE AND SCREEN
ABO/RH(D): A POS
Antibody Screen: NEGATIVE

## 2010-10-29 LAB — URINALYSIS, ROUTINE W REFLEX MICROSCOPIC
Bilirubin Urine: NEGATIVE
Glucose, UA: 500 mg/dL — AB
Hgb urine dipstick: NEGATIVE
Specific Gravity, Urine: 1.018 (ref 1.005–1.030)
Urobilinogen, UA: 0.2 mg/dL (ref 0.0–1.0)
pH: 5.5 (ref 5.0–8.0)

## 2010-10-29 LAB — PROTIME-INR: INR: 1 (ref 0.00–1.49)

## 2011-02-01 ENCOUNTER — Ambulatory Visit (INDEPENDENT_AMBULATORY_CARE_PROVIDER_SITE_OTHER): Payer: Medicare Other | Admitting: Cardiology

## 2011-02-01 ENCOUNTER — Encounter: Payer: Self-pay | Admitting: Cardiology

## 2011-02-01 VITALS — BP 140/70 | HR 51 | Ht 72.0 in | Wt 245.6 lb

## 2011-02-01 DIAGNOSIS — I452 Bifascicular block: Secondary | ICD-10-CM | POA: Insufficient documentation

## 2011-02-01 DIAGNOSIS — I2510 Atherosclerotic heart disease of native coronary artery without angina pectoris: Secondary | ICD-10-CM

## 2011-02-01 DIAGNOSIS — I10 Essential (primary) hypertension: Secondary | ICD-10-CM

## 2011-02-01 DIAGNOSIS — E785 Hyperlipidemia, unspecified: Secondary | ICD-10-CM

## 2011-02-01 DIAGNOSIS — E119 Type 2 diabetes mellitus without complications: Secondary | ICD-10-CM | POA: Insufficient documentation

## 2011-02-01 NOTE — Progress Notes (Signed)
Domingo Pulse Date of Birth: Jun 17, 1940   History of Present Illness: Mr. Michelle is seen today for followup. He states he has been doing very well. He denies any significant chest pain or shortness of breath. Since his last visit he was diagnosed with diabetes and was started on metformin. He has been much more careful with his diet and has lost 29 pounds. He is feeling better now. He denies any palpitations. He has had no dizziness or lightheadedness.  Current Outpatient Prescriptions on File Prior to Visit  Medication Sig Dispense Refill  . aspirin 81 MG tablet Take 81 mg by mouth daily.        . hydrochlorothiazide (,MICROZIDE/HYDRODIURIL,) 12.5 MG capsule Take 12.5 mg by mouth daily.        Marland Kitchen ibuprofen (ADVIL,MOTRIN) 200 MG tablet Take 200 mg by mouth every 6 (six) hours as needed.        . loratadine (CLARITIN) 10 MG tablet Take 10 mg by mouth as needed.        . metFORMIN (GLUCOPHAGE) 500 MG tablet Take 500 mg by mouth daily.        . metoprolol (TOPROL-XL) 100 MG 24 hr tablet Take 100 mg by mouth daily.        . niacin (NIASPAN) 500 MG CR tablet Take 500 mg by mouth at bedtime.        . quinapril (ACCUPRIL) 40 MG tablet Take 40 mg by mouth at bedtime.        . simvastatin (ZOCOR) 40 MG tablet Take 40 mg by mouth at bedtime.          Not on File  Past Medical History  Diagnosis Date  . Coronary artery disease   . Hyperlipidemia   . Hypertension   . Obesity   . Carotid arterial disease   . Dyspnea   . Osteoarthritis   . Diabetes mellitus     Past Surgical History  Procedure Date  . Carotid endarterectomy   . Back surgery     X2  . Mole surgery   . Coronary artery bypass graft 03/2003    X3. LIMA GRAFT TO THE LAD, SAPHENOUS VEIN GRAFT TO THE PA, AND A LEFT RADIAL GRAFT TO THEOBTUSE MARGINAL VESSEL  . Cardiac catheterization 11/29/2005    SEVERE THREE VESSEL OBSTRUCTIVE ATHERSCLEROTIC CAD. ALL GRAFTS WERE PATENT. EF 65%    History  Smoking status  . Former Smoker    . Quit date: 01/24/1994  Smokeless tobacco  . Not on file    History  Alcohol Use No    Family History  Problem Relation Age of Onset  . Heart attack Father   . Heart attack Brother   . Parkinsonism Brother   . Cancer Brother   . Arrhythmia Brother     Review of Systems:   All other systems were reviewed and are negative.  Physical Exam: BP 140/70  Pulse 51  Ht 6' (1.829 m)  Wt 245 lb 9.6 oz (111.403 kg)  BMI 33.31 kg/m2 He is an obese white male in no acute distress. He is normocephalic, atraumatic. Pupils equal round react to light accommodation. Extraocular movements are full. Oropharynx is clear. Neck is without JVD, adenopathy, thyromegaly, or bruits. Lungs are clear. Cardiac exam reveals a grade 2/6 systolic ejection murmur the right upper sternal border radiating to the left sternal border. There is no diastolic murmur or S3. Abdomen is obese, soft, nontender. He has no edema. Pedal pulses are good.  LABORATORY DATA: ECG today demonstrates sinus bradycardia with a rate of 52 beats per minute. He has a first degree AV block with a PR interval of 318 ms. He has a right bundle branch block and left anterior fascicular block..  Assessment / Plan:

## 2011-02-01 NOTE — Assessment & Plan Note (Signed)
He has a chronic bifascicular block. He also has sinus bradycardia with a prolonged PR interval. I recommended reducing his metoprolol to 50 mg twice a day. If he were ever to develop symptoms or signs of heart block we would need to stop his beta blocker therapy.

## 2011-02-01 NOTE — Patient Instructions (Addendum)
Continue your current medications.   We will schedule you for a nuclear stress test. Don't take your metoprolol the day before or the day of your stress test.  Reduce your metoprolol to 50 mg daily.  I will see you again in 6 months.

## 2011-02-01 NOTE — Assessment & Plan Note (Signed)
His most recent lab work that I have from 04/05/2010 showed a total cholesterol 129, triglycerides 104, HDL 34, and LDL of 71. These results are acceptable he will continue on his current medications.

## 2011-02-01 NOTE — Assessment & Plan Note (Signed)
He has made a significant effort with his diet and has lost 29 pounds. Have congratulated him on his efforts. He still needs to lose more weight. Will continue with his metformin therapy.

## 2011-02-01 NOTE — Assessment & Plan Note (Signed)
He remains asymptomatic. His last cardiac catheterization in 2007 showed that all his grafts are patent. His last stress test in 2009 showed a moderate area of ischemia in the inferior lateral wall and a small area of ischemia in the anterior wall which was unchanged from 2007. We will update his stress test at this time. We will attempt to do a stress test and we'll hold his metoprolol for the procedure. In the past he had difficulty reaching his target heart rate in which case he may need a pharmacologic study.

## 2011-02-01 NOTE — Assessment & Plan Note (Signed)
Blood pressure is well controlled on current medication.

## 2011-02-07 ENCOUNTER — Ambulatory Visit (HOSPITAL_COMMUNITY): Payer: Medicare Other | Attending: Internal Medicine | Admitting: Radiology

## 2011-02-07 DIAGNOSIS — I779 Disorder of arteries and arterioles, unspecified: Secondary | ICD-10-CM | POA: Insufficient documentation

## 2011-02-07 DIAGNOSIS — E119 Type 2 diabetes mellitus without complications: Secondary | ICD-10-CM | POA: Insufficient documentation

## 2011-02-07 DIAGNOSIS — R5381 Other malaise: Secondary | ICD-10-CM | POA: Insufficient documentation

## 2011-02-07 DIAGNOSIS — E669 Obesity, unspecified: Secondary | ICD-10-CM | POA: Insufficient documentation

## 2011-02-07 DIAGNOSIS — I4510 Unspecified right bundle-branch block: Secondary | ICD-10-CM | POA: Insufficient documentation

## 2011-02-07 DIAGNOSIS — Z951 Presence of aortocoronary bypass graft: Secondary | ICD-10-CM | POA: Insufficient documentation

## 2011-02-07 DIAGNOSIS — I2510 Atherosclerotic heart disease of native coronary artery without angina pectoris: Secondary | ICD-10-CM | POA: Insufficient documentation

## 2011-02-07 DIAGNOSIS — Z87891 Personal history of nicotine dependence: Secondary | ICD-10-CM | POA: Insufficient documentation

## 2011-02-07 DIAGNOSIS — I10 Essential (primary) hypertension: Secondary | ICD-10-CM | POA: Insufficient documentation

## 2011-02-07 DIAGNOSIS — Z8249 Family history of ischemic heart disease and other diseases of the circulatory system: Secondary | ICD-10-CM | POA: Insufficient documentation

## 2011-02-07 DIAGNOSIS — R000 Tachycardia, unspecified: Secondary | ICD-10-CM | POA: Insufficient documentation

## 2011-02-07 DIAGNOSIS — E785 Hyperlipidemia, unspecified: Secondary | ICD-10-CM | POA: Insufficient documentation

## 2011-02-07 MED ORDER — TECHNETIUM TC 99M TETROFOSMIN IV KIT
33.0000 | PACK | Freq: Once | INTRAVENOUS | Status: AC | PRN
  Administered 2011-02-07: 33 via INTRAVENOUS

## 2011-02-07 MED ORDER — TECHNETIUM TC 99M TETROFOSMIN IV KIT
11.0000 | PACK | Freq: Once | INTRAVENOUS | Status: AC | PRN
  Administered 2011-02-07: 11 via INTRAVENOUS

## 2011-02-07 NOTE — Progress Notes (Signed)
[facility] SITE 3 NUCLEAR MED [address] [place] [address] [phone]  Cardiology Nuclear Med Study  Evan [name] is a 71 y.o. male [id number] 23-Apr-1940   Nuclear Med Background Indication for Stress Test:  Evaluation for Ischemia and Graft Patency History:'05 CABG, '07 Heart Catheterization:Patent grafts, EF=65%; '09 Myocardial Perfusion Study:Moderate infero-lateral and anterior ischemia, EF=58% Cardiac Risk Factors: Carotid Disease, Family History - CAD, History of Smoking, Hypertension, Lipids, NIDDM, Obesity and RBBB  Symptoms:  Fatigue and Rapid HR   Nuclear Pre-Procedure Caffeine/Decaff Intake:  None NPO After: 8:00pm   Lungs:  Clear. IV 0.9% NS with Angio Cath:  20g  IV Site: R Hand  IV Started by:  Cathlyn Parsons, RN  Chest Size (in):  50 Cup Size: n/a  Height: 6' (1.829 m)  Weight:  239 lb (108.41 kg)  BMI:  Body mass index is 32.41 kg/(m^2). Tech Comments:  Toprol held x 48 hours    Nuclear Med Study 1 or 2 day study: 1 day  Stress Test Type:  Stress  Reading MD: Dietrich Pates, MD  Order Authorizing Provider:  [name] Swaziland, MD  Resting Radionuclide: Technetium 43m Tetrofosmin  Resting Radionuclide Dose: 11.0 mCi   Stress Radionuclide:  Technetium 71m Tetrofosmin  Stress Radionuclide Dose: 33.0 mCi           Stress Protocol Rest HR: 65 Stress HR: 150  Rest BP: Sitting 182/82  Standing 179/82 Stress BP: 245/94  Exercise Time (min): 5:01 METS: 7.0   Predicted Max HR: 151 bpm % Max HR: 99.34 bpm Rate Pressure Product: 18841   Dose of Adenosine (mg):  n/a Dose of Lexiscan: n/a mg  Dose of Atropine (mg): n/a Dose of Dobutamine: n/a mcg/kg/min (at max HR)  Stress Test Technologist: Smiley Houseman, CMA-N  Nuclear Technologist:  Domenic Polite, CNMT     Rest Procedure:  Myocardial perfusion imaging was performed at rest 45 minutes following the intravenous administration of Technetium 59m Tetrofosmin.  Rest ECG:No Acute  changes.  Stress Procedure:  The patient exercised for 5:01 on the treadmill utilizing the Bruce protocol.  The patient stopped due to fatigue and denied any chest pain.  There were no diagnostic ST-T wave changes.  There were occasional PAC's, PVC's and one 3-beat run of V-tach.    He had a hypertensive response to exercise with a BP of >245/94, immediately post exercise.  Technetium 3m Tetrofosmin was injected at peak exercise and myocardial perfusion imaging was performed after a brief delay.  Stress ECG: No ST changes to suggest ischemia.  Occasional couplet post exercise.  QPS Raw Data Images:  Images were motion corrected.  Soft tissue (diaphragm, bowel activity) underlie heart. Stress Images:  Minimal thinning in the inferolateral base.  Otherwise normal perfusion. Rest Images:  Minimal improvement from the stress images does not appear to represent significant ischemia. Subtraction (SDS):  Mild inferolateral ischemia by quantitation. Transient Ischemic Dilatation (Normal <1.22):  1.00 Lung/Heart Ratio (Normal <0.45):  0.27  Quantitative Gated Spect Images QGS EDV:  121 ml QGS ESV:  65 ml QGS cine images:  Visual estimate suggest LVEF is greater.  Overall normal wall thickening. QGS EF: 46%  Impression Exercise Capacity:  Fair exercise capacity. BP Response:  Hypertensive blood pressure response. Clinical Symptoms:  No chest pain. ECG Impression:  Occasional couplet post exercise.  No ST changes to suggest ischemia. Comparison with Prior Nuclear Study  Reported anterior ainferolateral ischemia do not appear to be present.  Overall Impression:  Basal inferolateral thinning.  No significant ischemia or scar.  LVEF 46%.  Visual estimate LVEF appears higher. Dietrich Pates

## 2011-04-13 ENCOUNTER — Encounter: Payer: Self-pay | Admitting: Vascular Surgery

## 2011-04-14 ENCOUNTER — Ambulatory Visit (INDEPENDENT_AMBULATORY_CARE_PROVIDER_SITE_OTHER): Payer: Medicare Other | Admitting: Neurosurgery

## 2011-04-14 ENCOUNTER — Other Ambulatory Visit (INDEPENDENT_AMBULATORY_CARE_PROVIDER_SITE_OTHER): Payer: Medicare Other | Admitting: *Deleted

## 2011-04-14 ENCOUNTER — Encounter: Payer: Self-pay | Admitting: Neurosurgery

## 2011-04-14 VITALS — BP 144/70 | HR 55 | Resp 18 | Ht 72.0 in | Wt 235.0 lb

## 2011-04-14 DIAGNOSIS — M25519 Pain in unspecified shoulder: Secondary | ICD-10-CM | POA: Insufficient documentation

## 2011-04-14 DIAGNOSIS — I6529 Occlusion and stenosis of unspecified carotid artery: Secondary | ICD-10-CM

## 2011-04-14 DIAGNOSIS — Z48812 Encounter for surgical aftercare following surgery on the circulatory system: Secondary | ICD-10-CM

## 2011-04-14 NOTE — Progress Notes (Signed)
VASCULAR AND VEIN SURGERY CAROTID FOLLOW-UP  Date of Surgery: Jan 2010 Surgeon: Darrick Penna  HPI: KHY PITRE is a 71 y.o. male right handed who has known carotid disease. Patient is doing well. Pt. has had surgical intervention of right CEA .  Patient has Negative history of TIA or stroke symptom.  The patient denies amaurosis fugax or monocular blindness.  The patient  denies facial drooping.   Pt. denies headache Pt. denies hemiplegia.  The patient denies receptive or expressive aphasia.  Pt. denies weakness in BUE/BLE  Pt. States that the symptoms have unchanged  Non-Invasive Vascular Imaging CAROTID DUPLEX 04/14/2011  Right ICA 20 - 39 % stenosis Left ICA 60 - 79 % stenosis  These findings are unchanged on the right and slightly worse on the left from previous exam  Physical Exam: Filed Vitals:   04/14/11 1054 04/14/11 1055  BP: 111/65 144/70  Pulse: 53 55  Resp: 18   Height: 6' (1.829 m)   Weight: 235 lb (106.595 kg)   SpO2: 98%     Pt is A&O x 3 Gait is normal Negative Right carotid bruit/s, mild bruit on the left Neuro Exam: Speech is normal Negative weakness BUE/BLE Positive, Negative tongue deviation Negative facial droop  Plan: Follow-up in 6 months with Carotid Duplex scan and will be seen in my clinic for further recommendations.  Pt was given information regarding stroke symptoms and prevention X.  Lauree Chandler ANP C.  Clinic MD: Darrick Penna

## 2011-04-25 NOTE — Procedures (Unsigned)
CAROTID DUPLEX EXAM  INDICATION:  Followup carotid stenosis.  HISTORY: Diabetes:  No. Cardiac:  CABG. Hypertension:  Yes. Smoking:  Previous. Previous Surgery:  Right carotid endarterectomy 01/29/2008. CV History:  Asymptomatic. Amaurosis Fugax No, Paresthesias No, Hemiparesis No.                                      RIGHT             LEFT Brachial systolic pressure:         112               140 Brachial Doppler waveforms:         Monophasic        Triphasic Vertebral direction of flow:        Retrograde        Antegrade DUPLEX VELOCITIES (cm/sec) CCA peak systolic                   131               154 ECA peak systolic                   131               213 ICA peak systolic                   83                325 ICA end diastolic                   26                95 PLAQUE MORPHOLOGY:                  Mixed             Mixed PLAQUE AMOUNT:                      Minimal           Severe PLAQUE LOCATION:                    CCA               CCA, ICA and ECA  IMPRESSION: 1. Patent right carotid endarterectomy site with no evidence of     restenosis. 2. Known right retrograde vertebral artery flow. 3. 60-79% left ICA stenosis, upper end of range. 4. Left ECA stenosis. 5. Increase velocity of the left ICA since prior study of 09/16/2010.  ___________________________________________ Janetta Hora. Darrick Penna, M.D.  SS/MEDQ  D:  04/14/2011  T:  04/14/2011  Job:  [id number]

## 2011-10-19 ENCOUNTER — Encounter: Payer: Self-pay | Admitting: Vascular Surgery

## 2011-10-20 ENCOUNTER — Encounter: Payer: Self-pay | Admitting: Vascular Surgery

## 2011-10-20 ENCOUNTER — Ambulatory Visit (INDEPENDENT_AMBULATORY_CARE_PROVIDER_SITE_OTHER): Payer: Medicare Other | Admitting: Vascular Surgery

## 2011-10-20 ENCOUNTER — Other Ambulatory Visit (INDEPENDENT_AMBULATORY_CARE_PROVIDER_SITE_OTHER): Payer: Medicare Other | Admitting: *Deleted

## 2011-10-20 VITALS — BP 186/80 | HR 51 | Resp 16 | Ht 72.0 in | Wt 255.0 lb

## 2011-10-20 DIAGNOSIS — Z48812 Encounter for surgical aftercare following surgery on the circulatory system: Secondary | ICD-10-CM

## 2011-10-20 DIAGNOSIS — I6529 Occlusion and stenosis of unspecified carotid artery: Secondary | ICD-10-CM

## 2011-10-20 NOTE — Progress Notes (Signed)
Patient is a 71 year old male who previously underwent right carotid endarterectomy in January 2010. We have been following him for a moderate left carotid stenosis. He denies any symptoms of TIA amaurosis or stroke. He overall is staying very active. Other chronic medical problems include coronary artery disease, hyperlipidemia, hypertension, diabetes all of which are currently stable.  Past Medical History  Diagnosis Date  . Coronary artery disease   . Hyperlipidemia   . Hypertension   . Obesity   . Carotid arterial disease   . Dyspnea   . Diabetes mellitus   . Torn rotator cuff 2012     Right arm  . Osteoarthritis     Left wrist   Review of systems: He denies shortness of breath. He denies chest pain.  Physical exam:  Filed Vitals:   10/20/11 0947 10/20/11 0949  BP: 150/78 186/80  Pulse: 51 51  Resp: 16   Height: 6' (1.829 m)   Weight: 255 lb (115.667 kg)   SpO2: 96%    Chest: Clear to auscultation  Neck: No carotid bruit  Cardiac: Regular rate and rhythm without murmur  Abdomen: Soft nontender nondistended obese  Neuro: Symmetric upper extremity and lower extremity motor strength which is 5 over 5    Data: Patient had a carotid duplex exam today which I reviewed and interpreted. This showed a patent right carotid endarterectomy 60-80% left internal carotid artery stenosis which is unchanged from March. There was a 20 mm blood pressure gradient with the left being higher than the right.   Assessment: Stable left moderate carotid stenosis, asymptomatic; no recurrent stenosis right internal carotid artery; hypertension today with left arm pressure higher than right suggesting some mild right subclavian stenosis. He does not seem to have symptoms from this.  Plan: Followup carotid duplex scan in 6 months. Continue aspirin therapy. I told him that he needs to have his blood pressure measured in his left arm always since this is higher and the right arm probably is not  accurate. He understands that his blood pressure was high today and he says that he has not been he been correctly recently and attributed it to this. He will followup on his blood pressure with his primary care physician.  Fabienne Bruns, MD [facility] Office: [phone] Pager: [phone]

## 2011-11-07 LAB — IFOBT (OCCULT BLOOD): IFOBT: NEGATIVE

## 2012-02-22 ENCOUNTER — Ambulatory Visit: Payer: Medicare Other | Admitting: Cardiology

## 2012-02-22 ENCOUNTER — Encounter: Payer: Self-pay | Admitting: Cardiology

## 2012-02-22 ENCOUNTER — Ambulatory Visit (INDEPENDENT_AMBULATORY_CARE_PROVIDER_SITE_OTHER): Payer: Medicare Other | Admitting: Cardiology

## 2012-02-22 VITALS — BP 106/64 | HR 61 | Ht 72.0 in | Wt 264.0 lb

## 2012-02-22 DIAGNOSIS — I452 Bifascicular block: Secondary | ICD-10-CM

## 2012-02-22 DIAGNOSIS — I2510 Atherosclerotic heart disease of native coronary artery without angina pectoris: Secondary | ICD-10-CM

## 2012-02-22 DIAGNOSIS — E785 Hyperlipidemia, unspecified: Secondary | ICD-10-CM

## 2012-02-22 DIAGNOSIS — I10 Essential (primary) hypertension: Secondary | ICD-10-CM

## 2012-02-22 DIAGNOSIS — I779 Disorder of arteries and arterioles, unspecified: Secondary | ICD-10-CM

## 2012-02-22 NOTE — Progress Notes (Signed)
Evan [name] Date of Birth: 1940/02/09   History of Present Illness: Evan [name] is seen today for followup. He has a history of coronary disease and is status post coronary bypass surgery in 2005. He had a cardiac catheterization 2007 which demonstrated his grafts were patent. He had a nuclear stress test in January of last year which demonstrated no significant perfusion abnormalities. His ejection fraction was estimated 46% but visually it appeared normal. He does note occasional chest tightness. 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 If he doesn't eat first he doesn't get chest tightness. Since his last visit his HCTZ was increased for management of hypertension. He admits that he loves sweets and has been indiscreet with his diet. He has gained 18 pounds over the past year. He did have carotid Doppler studies in September which demonstrated a widely patent right carotid artery. He had a 60-79% stenosis of the left ICA.  Current Outpatient Prescriptions on File Prior to Visit  Medication Sig Dispense Refill  . aspirin 81 MG tablet Take 81 mg by mouth daily.        Marland Kitchen ibuprofen (ADVIL,MOTRIN) 200 MG tablet Take 200 mg by mouth every 6 (six) hours as needed.        . metFORMIN (GLUCOPHAGE) 500 MG tablet Take 500 mg by mouth daily.        . metoprolol (TOPROL-XL) 100 MG 24 hr tablet Take 50 mg by mouth daily.       . niacin (NIASPAN) 500 MG CR tablet Take 500 mg by mouth at bedtime.        . quinapril (ACCUPRIL) 40 MG tablet Take 40 mg by mouth at bedtime.        . simvastatin (ZOCOR) 40 MG tablet Take 40 mg by mouth at bedtime.        . Soft Lens Products (OPTI-FREE REPLENISH) SOLN by Does not apply route. Pt takes three tabs daily for knees        No Known Allergies  Past Medical History  Diagnosis Date  . Coronary artery disease   . Hyperlipidemia   . Hypertension   . Obesity   . Carotid arterial disease   . Dyspnea   . Diabetes  mellitus   . Torn rotator cuff 2012     Right arm  . Osteoarthritis     Left wrist    Past Surgical History  Procedure Date  . Carotid endarterectomy   . Back surgery     X2  . Mole surgery   . Coronary artery bypass graft 03/2003    X3. LIMA GRAFT TO THE LAD, SAPHENOUS VEIN GRAFT TO THE PA, AND A LEFT RADIAL GRAFT TO THEOBTUSE MARGINAL VESSEL  . Cardiac catheterization 11/29/2005    SEVERE THREE VESSEL OBSTRUCTIVE ATHERSCLEROTIC CAD. ALL GRAFTS WERE PATENT. EF 65%    History  Smoking status  . Former Smoker  . Quit date: 01/24/1994  Smokeless tobacco  . Not on file    History  Alcohol Use No    Family History  Problem Relation Age of Onset  . Heart attack Father   . Heart attack Brother   . Parkinsonism Brother   . Cancer Brother   . Arrhythmia Brother     Review of Systems:   All other systems were reviewed and are negative.  Physical Exam: BP 106/64  [name] 61  Ht 6' (1.829 m)  Wt 264 lb (119.75 kg)  BMI 35.80 kg/m2 He is an obese white male in no acute distress. He is normocephalic, atraumatic. Pupils equal round react to light accommodation. Extraocular movements are full. Oropharynx is clear. Neck is without JVD, adenopathy, thyromegaly, or bruits. Lungs are clear. Cardiac exam reveals a grade 2/6 systolic ejection murmur the right upper sternal border radiating to the left sternal border. There is no diastolic murmur or S3. Abdomen is obese, soft, nontender. He has no edema. Pedal pulses are good. LABORATORY DATA: ECG today demonstrates sinus bradycardia with a rate of 61 beats per minute. He has a first degree AV block with a PR interval of 320 ms. He has a right bundle branch block and left anterior fascicular block..  Cardiology Nuclear Med Study  Evan [name] is a 72 y.o. male  [id number]  March 30, 1940  Nuclear Med Background  Indication for Stress Test: Evaluation for Ischemia and Graft Patency  History:'05 CABG, '07 Heart Catheterization:Patent  grafts, EF=65%; '09 Myocardial Perfusion Study:Moderate infero-lateral and anterior ischemia, EF=58%  Cardiac Risk Factors: Carotid Disease, Family History - CAD, History of Smoking, Hypertension, Lipids, NIDDM, Obesity and RBBB  Symptoms: Fatigue and Rapid HR  Nuclear Pre-Procedure  Caffeine/Decaff Intake: None  NPO After: 8:00pm   Lungs: Clear.  IV 0.9% NS with Angio Cath: 20g   IV Site: R Hand  IV Started by: Cathlyn Parsons, RN   Chest Size (in): 50  Cup Size: n/a   Height: 6' (1.829 m)  Weight: 239 lb (108.41 kg)   BMI: Body mass index is 32.41 kg/(m^2).  Tech Comments: Toprol held x 48 hours   Nuclear Med Study  1 or 2 day study: 1 day  Stress Test Type: Stress   Reading MD: Dietrich Pates, MD  Order Authorizing Provider: [name] Swaziland, MD   Resting Radionuclide: Technetium 79m Tetrofosmin  Resting Radionuclide Dose: 11.0 mCi   Stress Radionuclide: Technetium 8m Tetrofosmin  Stress Radionuclide Dose: 33.0 mCi   Stress Protocol  Rest HR: 65  Stress HR: 150   Rest BP: Sitting 182/82 Standing 179/82  Stress BP: 245/94   Exercise Time (min): 5:01  METS: 7.0   Predicted Max HR: 151 bpm  % Max HR: 99.34 bpm  Rate Pressure Product: 78295  Dose of Adenosine (mg): n/a  Dose of Lexiscan: n/a mg   Dose of Atropine (mg): n/a  Dose of Dobutamine: n/a mcg/kg/min (at max HR)   Stress Test Technologist: Smiley Houseman, CMA-N  Nuclear Technologist: Domenic Polite, CNMT   Rest Procedure: Myocardial perfusion imaging was performed at rest 45 minutes following the intravenous administration of Technetium 80m Tetrofosmin.  Rest ECG:No Acute changes.  Stress Procedure: The patient exercised for 5:01 on the treadmill utilizing the Bruce protocol. The patient stopped due to fatigue and denied any chest pain. There were no diagnostic ST-T wave changes. There were occasional PAC's, PVC's and one 3-beat run of V-tach. He had a hypertensive response to exercise with a BP of >245/94, immediately post exercise.  Technetium 2m Tetrofosmin was injected at peak exercise and myocardial perfusion imaging was performed after a brief delay.  Stress ECG: No ST changes to suggest ischemia. Occasional couplet post exercise.  QPS  Raw Data Images: Images were motion corrected. Soft tissue (diaphragm, bowel activity) underlie heart.  Stress Images: Minimal thinning in the inferolateral base. Otherwise normal perfusion.  Rest Images: Minimal improvement from the stress images does not appear to represent significant ischemia.  Subtraction (SDS): Mild inferolateral ischemia by quantitation.  Transient Ischemic Dilatation (Normal <  1.22): 1.00  Lung/Heart Ratio (Normal <0.45): 0.27  Quantitative Gated Spect Images  QGS EDV: 121 ml  QGS ESV: 65 ml  QGS cine images: Visual estimate suggest LVEF is greater. Overall normal wall thickening.  QGS EF: 46%  Impression  Exercise Capacity: Fair exercise capacity.  BP Response: Hypertensive blood pressure response.  Clinical Symptoms: No chest pain.  ECG Impression: Occasional couplet post exercise. No ST changes to suggest ischemia.  Comparison with Prior Nuclear Study Reported anterior ainferolateral ischemia do not appear to be present.  Overall Impression: Basal inferolateral thinning. No significant ischemia or scar. LVEF 46%. Visual estimate LVEF appears higher.  Dietrich Pates  Assessment / Plan: 1. Coronary disease status post CABG in 2005. He has stable class I anginal symptoms. His Myoview study last year showed no significant ischemia. We'll continue with his medical therapy including aspirin, metoprolol, and ACE inhibitor. Continue statin therapy.  2. Hyperlipidemia, on simvastatin and niacin. I have requested his most recent lab work from his primary care.  3. Diabetes mellitus type 2. I stressed the importance of avoiding sweets and losing weight. He will continue metformin.  4. Hypertension well controlled today.  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 We'll continue his current dose of metoprolol. If he develops any increase AV block we may need to stop his metoprolol completely.  6. Carotid arterial disease test was recommended endarterectomy.

## 2012-02-22 NOTE — Patient Instructions (Signed)
You need to lose weight and cut out the sweets.  Stay as active as you can.  Stay on your current medication  I will see you again in 6 months.

## 2012-04-19 ENCOUNTER — Ambulatory Visit: Payer: Medicare Other | Admitting: Vascular Surgery

## 2012-04-19 ENCOUNTER — Other Ambulatory Visit: Payer: Medicare Other

## 2012-05-16 ENCOUNTER — Encounter: Payer: Self-pay | Admitting: Vascular Surgery

## 2012-05-17 ENCOUNTER — Other Ambulatory Visit (INDEPENDENT_AMBULATORY_CARE_PROVIDER_SITE_OTHER): Payer: Medicare Other | Admitting: Vascular Surgery

## 2012-05-17 ENCOUNTER — Telehealth: Payer: Self-pay

## 2012-05-17 ENCOUNTER — Ambulatory Visit (INDEPENDENT_AMBULATORY_CARE_PROVIDER_SITE_OTHER): Payer: Medicare Other | Admitting: Vascular Surgery

## 2012-05-17 ENCOUNTER — Other Ambulatory Visit: Payer: Self-pay

## 2012-05-17 DIAGNOSIS — I6529 Occlusion and stenosis of unspecified carotid artery: Secondary | ICD-10-CM

## 2012-05-17 DIAGNOSIS — Z48812 Encounter for surgical aftercare following surgery on the circulatory system: Secondary | ICD-10-CM

## 2012-05-17 NOTE — Telephone Encounter (Signed)
See note regarding cardiac clearance for Left CEA per Dr. [name] Swaziland.

## 2012-05-17 NOTE — Progress Notes (Signed)
[facility] [place] HISTORY AND PHYSICAL   History of Present Illness:  Patient is a 72 y.o. 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  He previously underwent right carotid endarterectomy January 2010. He has done well since then. He returns for routine carotid followup today. He denies any symptoms of TIA amaurosis or stroke. He does complain intermittently of some left calf pain when walking long distances. This resolves within a few minutes rest or ibuprofen. He denies rest pain in the feet. He has been getting dyspnea with exertion. He also states he gets sometimes short of breath after eating a large meal and then working. Other medical problems include coronary artery disease, hypertension, hyperlipidemia, obesity, diabetes, arthritis all of which are currently stable. He is on once daily aspirin. He is also on a statin.  Past Medical History  Diagnosis Date  . Coronary artery disease   . Hyperlipidemia   . Hypertension   . Obesity   . Carotid arterial disease   . Dyspnea   . Diabetes mellitus   . Torn rotator cuff 2012     Right arm  . Osteoarthritis     Left wrist    Past Surgical History  Procedure Laterality Date  . Carotid endarterectomy    . Back surgery      X2  . Mole surgery    . Coronary artery bypass graft  03/2003    X3. LIMA GRAFT TO THE LAD, SAPHENOUS VEIN GRAFT TO THE PA, AND A LEFT RADIAL GRAFT TO THEOBTUSE MARGINAL VESSEL  . Cardiac catheterization  11/29/2005    SEVERE THREE VESSEL OBSTRUCTIVE ATHERSCLEROTIC CAD. ALL GRAFTS WERE PATENT. EF 65%     Social History History  Substance Use Topics  . Smoking status: Former Smoker    Quit date: 01/24/1994  . Smokeless tobacco: Not on file  . Alcohol Use: No    Family History Family History  Problem Relation Age of Onset  . Heart attack Father   . Heart attack Brother   . Parkinsonism Brother   . Cancer Brother   . Arrhythmia Brother      Allergies  No Known Allergies   Current Outpatient Prescriptions  Medication Sig Dispense Refill  . aspirin 81 MG tablet Take 81 mg by mouth daily.        . hydrochlorothiazide (HYDRODIURIL) 25 MG tablet Take 25 mg by mouth daily.      Marland Kitchen ibuprofen (ADVIL,MOTRIN) 200 MG tablet Take 200 mg by mouth every 6 (six) hours as needed.        . metFORMIN (GLUCOPHAGE) 500 MG tablet Take 500 mg by mouth 2 (two) times daily with a meal.       . metoprolol (TOPROL-XL) 100 MG 24 hr tablet Take 50 mg by mouth daily.       . niacin (NIASPAN) 500 MG CR tablet Take 500 mg by mouth at bedtime.        . quinapril (ACCUPRIL) 40 MG tablet Take 40 mg by mouth at bedtime.        . simvastatin (ZOCOR) 40 MG tablet Take 40 mg by mouth at bedtime.        . Soft Lens Products (OPTI-FREE REPLENISH) SOLN by Does not apply route. Pt takes three tabs daily for knees       No current facility-administered medications for this visit.    ROS:   General:  No weight loss, Fever, chills  HEENT: No recent  headaches, no nasal bleeding, no visual changes, no sore throat  Neurologic: No dizziness, blackouts, seizures. No recent symptoms of stroke or mini- stroke. No recent episodes of slurred speech, or temporary blindness.  Cardiac: No recent episodes of chest pain/pressure, no shortness of breath at rest.  No shortness of breath with exertion.  Denies history of atrial fibrillation or irregular heartbeat  Vascular: No history of rest pain in feet.  No history of claudication.  No history of non-healing ulcer, No history of DVT   Pulmonary: No home oxygen, no productive cough, no hemoptysis,  No asthma or wheezing  Musculoskeletal:  [ ]  Arthritis, [ ]  Low back pain,  [ ]  Joint pain  Hematologic:No history of hypercoagulable state.  No history of easy bleeding.  No history of anemia  Gastrointestinal: No hematochezia or melena,  No gastroesophageal reflux, no trouble swallowing  Urinary: [ ]  chronic Kidney  disease, [ ]  on HD - [ ]  MWF or [ ]  TTHS, [ ]  Burning with urination, [ ]  Frequent urination, [ ]  Difficulty urinating;   Skin: No rashes  Psychological: No history of anxiety,  No history of depression   Physical Examination  Filed Vitals:   05/17/12 1151  BP: 170/60  Pulse: 55  Resp: 18  Height: 6' (1.829 m)  Weight: 267 lb (121.11 kg)  SpO2: 98%    Body mass index is 36.2 kg/(m^2).  General:  Alert and oriented, no acute distress HEENT: Normal Neck: Left-sided carotid bruit Pulmonary: Clear to auscultation bilaterally Cardiac: Regular Rate and Rhythm without murmur Abdomen: Soft, non-tender, non-distended, no mass Skin: No rash Extremity Pulses:  2+ radial, brachial, femoral, posterior tibial pulses bilaterally Musculoskeletal: No deformity or edema  Neurologic: Upper and lower extremity motor 5/5 and symmetric  DATA: Carotid duplex was performed in our office today. I reviewed and interpreted this study. This shows a high-grade left internal carotid artery stenosis with peak systolic velocity 377 cm/s and diastolic 127 also noted was a high carotid bifurcation. He was also noted to have a 50 millimeter gradient with his left arm pressure being significantly higher than his right 158 compared to 110 on the right.  For VQI Use Only   PRE-ADM LIVING: Home  AMB STATUS: Ambulatory  CAD Sx: dyspnea on exertion  PRIOR CHF: None  STRESS TEST: [ ]  No, [ ]  Normal, [ ]  + ischemia, [ ]  + MI, [ ]  Both , pending   ASSESSMENT: High-grade asymptomatic left internal carotid artery stenosis. The patient would benefit from left carotid endarterectomy for stroke prophylaxis. 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   PLAN:  We will schedule the patient to be evaluated by Dr. [name] Swaziland for preoperative cardiac risk stratification. He will continue his aspirin and cholesterol lowering agents. He is tentatively scheduled for left carotid endarterectomy on  May 5 pending the outcome of his cardiac evaluation. Risks benefits possible complications and procedure details were described to the patient and his wife today. These include but are not limited to bleeding infection stroke risk 1-2% cranial nerve injury 10-15%. I also discussed with the patient today he does have a high carotid bifurcation which may make some neuropraxia more likely.  Fabienne Bruns, MD [facility] [place] Office: [phone] Pager: [phone]

## 2012-05-17 NOTE — Telephone Encounter (Signed)
-----   Message ----- From: [name] M Swaziland, MD Sent: 05/17/2012 12:50 PM  To: Sherren Kerns, MD   I just saw him in January. His cardiac work up is up to date. I think you can proceed safely. Theron Arista   ----- Message ----- From: Sherren Kerns, MD Sent: 05/17/2012 12:35 PM  To: [name] M Swaziland, MD   Theron Arista, He is scheduled for left CEA on May 5. Our office was going to have you see him for cardiac risk stratification but if you think he needs no further testing we will just proceed. Thanks Leonette Most

## 2012-05-23 ENCOUNTER — Ambulatory Visit: Payer: Medicare Other | Admitting: Physician Assistant

## 2012-05-23 ENCOUNTER — Encounter (HOSPITAL_COMMUNITY): Payer: Self-pay | Admitting: Pharmacist

## 2012-05-23 NOTE — Pre-Procedure Instructions (Signed)
Evan [name]  05/23/2012   Your procedure is scheduled on:  Mon, May 5 @ 7:30 AM  Report to [facility] at 5:30 AM.  Call this number if you have problems the morning of surgery: [phone]   Remember:   Do not eat food or drink liquids after midnight.   Take these medicines the morning of surgery with A SIP OF WATER: Metoprolol(Toprol)               Stop taking your Ibuprofen.No Goody's,BC's,Aleve,Fish Oil,or any Herbal Medications   Do not wear jewelry  Do not wear lotions, powders, or colognes. You may wear deodorant.  Men may shave face and neck.  Do not bring valuables to the hospital.  Contacts, dentures or bridgework may not be worn into surgery.  Leave suitcase in the car. After surgery it may be brought to your room.  For patients admitted to the hospital, checkout time is 11:00 AM the day of  discharge.   Patients discharged the day of surgery will not be allowed to drive  home.    Special Instructions: Shower using CHG 2 nights before surgery and the night before surgery.  If you shower the day of surgery use CHG.  Use special wash - you have one bottle of CHG for all showers.  You should use approximately 1/3 of the bottle for each shower.   Please read over the following fact sheets that you were given: Pain Booklet, Coughing and Deep Breathing, Blood Transfusion Information, MRSA Information and Surgical Site Infection Prevention

## 2012-05-24 ENCOUNTER — Encounter (HOSPITAL_COMMUNITY)
Admission: RE | Admit: 2012-05-24 | Discharge: 2012-05-24 | Disposition: A | Discharge status: Home / Self Care | Payer: Medicare Other | Source: Ambulatory Visit | Attending: Vascular Surgery | Admitting: Vascular Surgery

## 2012-05-24 ENCOUNTER — Ambulatory Visit (HOSPITAL_COMMUNITY)
Admission: RE | Admit: 2012-05-24 | Discharge: 2012-05-24 | Disposition: A | Discharge status: Home / Self Care | Payer: Medicare Other | Source: Ambulatory Visit | Attending: Anesthesiology | Admitting: Anesthesiology

## 2012-05-24 ENCOUNTER — Encounter (HOSPITAL_COMMUNITY): Payer: Self-pay

## 2012-05-24 DIAGNOSIS — Z01818 Encounter for other preprocedural examination: Secondary | ICD-10-CM | POA: Insufficient documentation

## 2012-05-24 DIAGNOSIS — E669 Obesity, unspecified: Secondary | ICD-10-CM | POA: Insufficient documentation

## 2012-05-24 DIAGNOSIS — H9190 Unspecified hearing loss, unspecified ear: Secondary | ICD-10-CM | POA: Insufficient documentation

## 2012-05-24 DIAGNOSIS — E119 Type 2 diabetes mellitus without complications: Secondary | ICD-10-CM | POA: Insufficient documentation

## 2012-05-24 DIAGNOSIS — Z87891 Personal history of nicotine dependence: Secondary | ICD-10-CM | POA: Insufficient documentation

## 2012-05-24 DIAGNOSIS — M1990 Unspecified osteoarthritis, unspecified site: Secondary | ICD-10-CM | POA: Insufficient documentation

## 2012-05-24 DIAGNOSIS — Z01812 Encounter for preprocedural laboratory examination: Secondary | ICD-10-CM | POA: Insufficient documentation

## 2012-05-24 DIAGNOSIS — Z01810 Encounter for preprocedural cardiovascular examination: Secondary | ICD-10-CM | POA: Insufficient documentation

## 2012-05-24 DIAGNOSIS — I2510 Atherosclerotic heart disease of native coronary artery without angina pectoris: Secondary | ICD-10-CM | POA: Insufficient documentation

## 2012-05-24 DIAGNOSIS — Z951 Presence of aortocoronary bypass graft: Secondary | ICD-10-CM | POA: Insufficient documentation

## 2012-05-24 DIAGNOSIS — I10 Essential (primary) hypertension: Secondary | ICD-10-CM | POA: Insufficient documentation

## 2012-05-24 HISTORY — DX: Unspecified hearing loss, unspecified ear: H91.90

## 2012-05-24 LAB — CBC
MCH: 29.3 pg (ref 26.0–34.0)
Platelets: 160 10*3/uL (ref 150–400)
RBC: 4.44 MIL/uL (ref 4.22–5.81)
RDW: 13 % (ref 11.5–15.5)
WBC: 6.9 10*3/uL (ref 4.0–10.5)

## 2012-05-24 LAB — COMPREHENSIVE METABOLIC PANEL
ALT: 19 U/L (ref 0–53)
AST: 25 U/L (ref 0–37)
Albumin: 4.2 g/dL (ref 3.5–5.2)
CO2: 26 mEq/L (ref 19–32)
Calcium: 9.6 mg/dL (ref 8.4–10.5)
Creatinine, Ser: 0.91 mg/dL (ref 0.50–1.35)
GFR calc non Af Amer: 84 mL/min — ABNORMAL LOW (ref >90)
Sodium: 140 mEq/L (ref 135–145)

## 2012-05-24 LAB — URINALYSIS, ROUTINE W REFLEX MICROSCOPIC
Glucose, UA: 250 mg/dL — AB
Ketones, ur: NEGATIVE mg/dL
Leukocytes, UA: NEGATIVE
Nitrite: NEGATIVE
Protein, ur: NEGATIVE mg/dL
Urobilinogen, UA: 0.2 mg/dL (ref 0.0–1.0)

## 2012-05-24 LAB — SURGICAL PCR SCREEN
MRSA, PCR: NEGATIVE
Staphylococcus aureus: NEGATIVE

## 2012-05-24 LAB — PROTIME-INR: INR: 0.96 (ref 0.00–1.49)

## 2012-05-24 NOTE — Progress Notes (Signed)
PCP is Dr. Clydie Braun at [facility] in [place]. Cardiologist is Dr. [name] Swaziland at [facility]. Encounters in [facility].

## 2012-05-24 NOTE — Pre-Procedure Instructions (Signed)
GIFFORD BALLON  05/24/2012   Your procedure is scheduled on:  Mon, May 5 @ 7:30 AM  Report to [facility] at 5:30 AM.  Call this number if you have problems the morning of surgery: [phone]   Remember:   Do not eat food or drink liquids after midnight.   Take these medicines the morning of surgery with A SIP OF WATER: Metoprolol(Toprol)               Stop taking your Ibuprofen.No Goody's,BC's,Aleve,Fish Oil,or any Herbal Medications  Do not take any diabetic medications the morning of surgery    Do not wear jewelry  Do not wear lotions, powders, or colognes. You may wear deodorant.  Men may shave face and neck.  Do not bring valuables to the hospital.  Contacts, dentures or bridgework may not be worn into surgery.  Leave suitcase in the car. After surgery it may be brought to your room.  For patients admitted to the hospital, checkout time is 11:00 AM the day of discharge.   Patients discharged the day of surgery will not be allowed to drive home.    Special Instructions: Shower using CHG 2 nights before surgery and the night before surgery.  If you shower the day of surgery use CHG.  Use special wash - you have one bottle of CHG for all showers.  You should use approximately 1/3 of the bottle for each shower.   Please read over the following fact sheets that you were given: Pain Booklet, Coughing and Deep Breathing, Blood Transfusion Information, MRSA Information and Surgical Site Infection Prevention

## 2012-05-25 LAB — TYPE AND SCREEN
ABO/RH(D): A POS
Antibody Screen: NEGATIVE

## 2012-05-25 NOTE — Progress Notes (Signed)
Anesthesia Chart Review:  Patient is a 72 year old male scheduled for left CEA on 05/28/12 by Dr. Darrick Penna.  History includes former smoker, CAD s/p CABG '05 at [facility] (LIMA to LAD, free radial artery to OM1, SVG to PDA), obesity, HTN, DM2, OA, hard of hearing, prior back surgery, carotid artery stenosis s/p right CEA '10.  PCP is Dr. Clydie Braun at [place].  Cardiologist is Dr. [name] Swaziland, who is aware of planned procedure.  By telephone encounter from 05/17/12, "... I think you can proceed safely."  EKG on 02/22/12 SR with first degree AVB, right BBB, LAFB.  Nuclear stress test on 1//15/13 showed: Basal inferolateral thinning. No significant ischemia or scar. LVEF 46%. Visual estimate LVEF appears higher.  Cardiac cath on 11/29/05 showed patent bypass grafts, good Lv function.  Preoperative CXR and labs noted.  If no acute change in his status then anticipate that he can proceed as planned.  Velna Ochs [facility]Anesthesiology Phone [phone] 05/25/2012 9:43 AM

## 2012-05-25 NOTE — Progress Notes (Signed)
05/25/12 0950  OBSTRUCTIVE SLEEP APNEA  Score 4 or greater  Results sent to PCP  This patient screened at risk for sleep apnea using the STOP Bang assessment tool during a pre-surgical visit. A score of 4 or greater is at risk for sleep apnea.

## 2012-05-27 MED ORDER — DEXTROSE 5 % IV SOLN
1.5000 g | INTRAVENOUS | Status: AC
  Administered 2012-05-28: 1.5 g via INTRAVENOUS
  Filled 2012-05-27: qty 1.5

## 2012-05-28 ENCOUNTER — Telehealth: Payer: Self-pay | Admitting: *Deleted

## 2012-05-28 ENCOUNTER — Ambulatory Visit (HOSPITAL_COMMUNITY): Payer: Medicare Other | Admitting: Anesthesiology

## 2012-05-28 ENCOUNTER — Telehealth: Payer: Self-pay | Admitting: Vascular Surgery

## 2012-05-28 ENCOUNTER — Encounter (HOSPITAL_COMMUNITY): Payer: Self-pay | Admitting: *Deleted

## 2012-05-28 ENCOUNTER — Encounter (HOSPITAL_COMMUNITY): Payer: Self-pay | Admitting: Vascular Surgery

## 2012-05-28 ENCOUNTER — Inpatient Hospital Stay (HOSPITAL_COMMUNITY)
Admission: RE | Admit: 2012-05-28 | Discharge: 2012-05-29 | DRG: 039 | Disposition: A | Discharge status: Home / Self Care | Payer: Medicare Other | Source: Ambulatory Visit | Attending: Vascular Surgery | Admitting: Vascular Surgery

## 2012-05-28 ENCOUNTER — Encounter (HOSPITAL_COMMUNITY)
Admission: RE | Disposition: A | Discharge status: Home / Self Care | Payer: Self-pay | Source: Ambulatory Visit | Attending: Vascular Surgery

## 2012-05-28 DIAGNOSIS — Z6835 Body mass index (BMI) 35.0-35.9, adult: Secondary | ICD-10-CM

## 2012-05-28 DIAGNOSIS — H9190 Unspecified hearing loss, unspecified ear: Secondary | ICD-10-CM | POA: Diagnosis present

## 2012-05-28 DIAGNOSIS — Z87891 Personal history of nicotine dependence: Secondary | ICD-10-CM

## 2012-05-28 DIAGNOSIS — Z7982 Long term (current) use of aspirin: Secondary | ICD-10-CM

## 2012-05-28 DIAGNOSIS — I6529 Occlusion and stenosis of unspecified carotid artery: Secondary | ICD-10-CM

## 2012-05-28 DIAGNOSIS — M19039 Primary osteoarthritis, unspecified wrist: Secondary | ICD-10-CM | POA: Diagnosis present

## 2012-05-28 DIAGNOSIS — Z79899 Other long term (current) drug therapy: Secondary | ICD-10-CM

## 2012-05-28 DIAGNOSIS — E785 Hyperlipidemia, unspecified: Secondary | ICD-10-CM | POA: Diagnosis present

## 2012-05-28 DIAGNOSIS — E119 Type 2 diabetes mellitus without complications: Secondary | ICD-10-CM | POA: Diagnosis present

## 2012-05-28 DIAGNOSIS — I10 Essential (primary) hypertension: Secondary | ICD-10-CM | POA: Diagnosis present

## 2012-05-28 DIAGNOSIS — Z951 Presence of aortocoronary bypass graft: Secondary | ICD-10-CM

## 2012-05-28 DIAGNOSIS — Z8249 Family history of ischemic heart disease and other diseases of the circulatory system: Secondary | ICD-10-CM

## 2012-05-28 DIAGNOSIS — I452 Bifascicular block: Secondary | ICD-10-CM | POA: Diagnosis present

## 2012-05-28 DIAGNOSIS — I2510 Atherosclerotic heart disease of native coronary artery without angina pectoris: Secondary | ICD-10-CM | POA: Diagnosis present

## 2012-05-28 DIAGNOSIS — E669 Obesity, unspecified: Secondary | ICD-10-CM | POA: Diagnosis present

## 2012-05-28 HISTORY — PX: ENDARTERECTOMY: SHX5162

## 2012-05-28 HISTORY — PX: PATCH ANGIOPLASTY: SHX6230

## 2012-05-28 LAB — GLUCOSE, CAPILLARY
Glucose-Capillary: 141 mg/dL — ABNORMAL HIGH (ref 70–99)
Glucose-Capillary: 153 mg/dL — ABNORMAL HIGH (ref 70–99)
Glucose-Capillary: 182 mg/dL — ABNORMAL HIGH (ref 70–99)

## 2012-05-28 SURGERY — ENDARTERECTOMY, CAROTID
Anesthesia: General | Site: Neck | Laterality: Left | Wound class: Clean

## 2012-05-28 MED ORDER — ZOLPIDEM TARTRATE 5 MG PO TABS: 5.0000 mg | ORAL_TABLET | Freq: Every evening | ORAL | Status: DC | PRN

## 2012-05-28 MED ORDER — LIDOCAINE HCL (CARDIAC) 20 MG/ML IV SOLN
INTRAVENOUS | Status: DC | PRN
  Administered 2012-05-28: 70 mg via INTRAVENOUS

## 2012-05-28 MED ORDER — NEOSTIGMINE METHYLSULFATE 1 MG/ML IJ SOLN
INTRAMUSCULAR | Status: DC | PRN
  Administered 2012-05-28: 3 mg via INTRAVENOUS

## 2012-05-28 MED ORDER — LABETALOL HCL 5 MG/ML IV SOLN
INTRAVENOUS | Status: DC | PRN
  Administered 2012-05-28 (×4): 2.5 mg via INTRAVENOUS
  Administered 2012-05-28 (×2): 5 mg via INTRAVENOUS
  Administered 2012-05-28 (×2): 2.5 mg via INTRAVENOUS
  Administered 2012-05-28: 5 mg via INTRAVENOUS

## 2012-05-28 MED ORDER — CEFUROXIME SODIUM 1.5 G IJ SOLR
1.5000 g | Freq: Two times a day (BID) | INTRAMUSCULAR | Status: AC
  Administered 2012-05-28 – 2012-05-29 (×2): 1.5 g via INTRAVENOUS
  Filled 2012-05-28 (×2): qty 1.5

## 2012-05-28 MED ORDER — HEPARIN SODIUM (PORCINE) 1000 UNIT/ML IJ SOLN
INTRAMUSCULAR | Status: DC | PRN
  Administered 2012-05-28: 10000 [IU] via INTRAVENOUS
  Administered 2012-05-28: 5000 [IU] via INTRAVENOUS

## 2012-05-28 MED ORDER — CEFUROXIME SODIUM 1.5 G IJ SOLR
INTRAMUSCULAR | Status: AC
  Filled 2012-05-28: qty 1.5

## 2012-05-28 MED ORDER — HYDROMORPHONE HCL PF 1 MG/ML IJ SOLN
0.2500 mg | INTRAMUSCULAR | Status: DC | PRN
  Administered 2012-05-28: 0.5 mg via INTRAVENOUS

## 2012-05-28 MED ORDER — DEXTROSE 5 % IV SOLN
INTRAVENOUS | Status: AC
  Filled 2012-05-28: qty 50

## 2012-05-28 MED ORDER — ENOXAPARIN SODIUM 30 MG/0.3ML ~~LOC~~ SOLN: 30.0000 mg | SUBCUTANEOUS | Status: DC

## 2012-05-28 MED ORDER — OXYCODONE HCL 5 MG PO TABS
5.0000 mg | ORAL_TABLET | ORAL | Status: DC | PRN
  Administered 2012-05-28: 5 mg via ORAL
  Administered 2012-05-28: 10 mg via ORAL
  Filled 2012-05-28: qty 1

## 2012-05-28 MED ORDER — DOCUSATE SODIUM 100 MG PO CAPS
100.0000 mg | ORAL_CAPSULE | Freq: Every day | ORAL | Status: DC
  Filled 2012-05-28: qty 1

## 2012-05-28 MED ORDER — OXYCODONE HCL 5 MG PO TABS: 5.0000 mg | ORAL_TABLET | Freq: Four times a day (QID) | ORAL | Status: DC | PRN

## 2012-05-28 MED ORDER — HYDROMORPHONE HCL PF 1 MG/ML IJ SOLN
INTRAMUSCULAR | Status: AC
  Filled 2012-05-28: qty 1

## 2012-05-28 MED ORDER — HYDRALAZINE HCL 20 MG/ML IJ SOLN
10.0000 mg | INTRAMUSCULAR | Status: DC | PRN
  Administered 2012-05-28: 10 mg via INTRAVENOUS
  Filled 2012-05-28: qty 1

## 2012-05-28 MED ORDER — LACTATED RINGERS IV SOLN
INTRAVENOUS | Status: DC | PRN
  Administered 2012-05-28 (×3): via INTRAVENOUS

## 2012-05-28 MED ORDER — MORPHINE SULFATE 2 MG/ML IJ SOLN
2.0000 mg | INTRAMUSCULAR | Status: DC | PRN
  Administered 2012-05-28 (×2): 2 mg via INTRAVENOUS
  Filled 2012-05-28: qty 2
  Filled 2012-05-28: qty 1

## 2012-05-28 MED ORDER — GLYCOPYRROLATE 0.2 MG/ML IJ SOLN
INTRAMUSCULAR | Status: DC | PRN
  Administered 2012-05-28: 0.4 mg via INTRAVENOUS

## 2012-05-28 MED ORDER — THROMBIN 20000 UNITS EX SOLR
CUTANEOUS | Status: AC
  Filled 2012-05-28: qty 20000

## 2012-05-28 MED ORDER — DOPAMINE-DEXTROSE 3.2-5 MG/ML-% IV SOLN: 3.0000 ug/kg/min | INTRAVENOUS | Status: DC

## 2012-05-28 MED ORDER — METOPROLOL SUCCINATE ER 50 MG PO TB24
50.0000 mg | ORAL_TABLET | Freq: Every morning | ORAL | Status: DC
  Filled 2012-05-28: qty 1

## 2012-05-28 MED ORDER — ASPIRIN EC 81 MG PO TBEC
81.0000 mg | DELAYED_RELEASE_TABLET | Freq: Every morning | ORAL | Status: DC
Start: 2012-05-29 — End: 2012-05-29
  Filled 2012-05-28: qty 1

## 2012-05-28 MED ORDER — METFORMIN HCL 500 MG PO TABS
500.0000 mg | ORAL_TABLET | Freq: Every day | ORAL | Status: DC
  Administered 2012-05-29: 500 mg via ORAL
  Filled 2012-05-28 (×2): qty 1

## 2012-05-28 MED ORDER — ACETAMINOPHEN 650 MG RE SUPP: 325.0000 mg | RECTAL | Status: DC | PRN

## 2012-05-28 MED ORDER — LIDOCAINE HCL (PF) 1 % IJ SOLN
INTRAMUSCULAR | Status: AC
  Filled 2012-05-28: qty 30

## 2012-05-28 MED ORDER — INSULIN ASPART 100 UNIT/ML ~~LOC~~ SOLN
0.0000 [IU] | Freq: Three times a day (TID) | SUBCUTANEOUS | Status: DC
  Administered 2012-05-28 – 2012-05-29 (×2): 3 [IU] via SUBCUTANEOUS

## 2012-05-28 MED ORDER — LABETALOL HCL 5 MG/ML IV SOLN: 10.0000 mg | INTRAVENOUS | Status: DC | PRN

## 2012-05-28 MED ORDER — HYDROCHLOROTHIAZIDE 25 MG PO TABS
25.0000 mg | ORAL_TABLET | Freq: Every morning | ORAL | Status: DC
  Administered 2012-05-28: 25 mg via ORAL
  Filled 2012-05-28 (×2): qty 1

## 2012-05-28 MED ORDER — SIMVASTATIN 40 MG PO TABS
40.0000 mg | ORAL_TABLET | Freq: Every day | ORAL | Status: DC
  Administered 2012-05-28: 40 mg via ORAL
  Filled 2012-05-28 (×2): qty 1

## 2012-05-28 MED ORDER — PHENOL 1.4 % MT LIQD: 1.0000 | OROMUCOSAL | Status: DC | PRN

## 2012-05-28 MED ORDER — SODIUM CHLORIDE 0.9 % IV SOLN: 500.0000 mL | Freq: Once | INTRAVENOUS | Status: AC | PRN

## 2012-05-28 MED ORDER — SODIUM CHLORIDE 0.9 % IV SOLN
10.0000 mg | INTRAVENOUS | Status: DC | PRN
  Administered 2012-05-28: 40 ug/min via INTRAVENOUS

## 2012-05-28 MED ORDER — HYDRALAZINE HCL 20 MG/ML IJ SOLN
INTRAMUSCULAR | Status: AC
  Administered 2012-05-29: 10 mg via INTRAVENOUS
  Filled 2012-05-28: qty 1

## 2012-05-28 MED ORDER — OXYCODONE HCL 5 MG PO TABS
ORAL_TABLET | ORAL | Status: AC
  Filled 2012-05-28: qty 2

## 2012-05-28 MED ORDER — SODIUM CHLORIDE 0.9 % IV SOLN
INTRAVENOUS | Status: DC
  Administered 2012-05-28: 17:00:00 via INTRAVENOUS

## 2012-05-28 MED ORDER — DEXTROSE 5 % IV SOLN
INTRAVENOUS | Status: DC | PRN
  Administered 2012-05-28: 08:00:00 via INTRAVENOUS

## 2012-05-28 MED ORDER — ROCURONIUM BROMIDE 100 MG/10ML IV SOLN
INTRAVENOUS | Status: DC | PRN
  Administered 2012-05-28: 20 mg via INTRAVENOUS
  Administered 2012-05-28: 50 mg via INTRAVENOUS

## 2012-05-28 MED ORDER — METOPROLOL TARTRATE 1 MG/ML IV SOLN: 2.0000 mg | INTRAVENOUS | Status: DC | PRN

## 2012-05-28 MED ORDER — POTASSIUM CHLORIDE CRYS ER 20 MEQ PO TBCR: 20.0000 meq | EXTENDED_RELEASE_TABLET | Freq: Once | ORAL | Status: AC | PRN

## 2012-05-28 MED ORDER — NIACIN ER (ANTIHYPERLIPIDEMIC) 500 MG PO TBCR
500.0000 mg | EXTENDED_RELEASE_TABLET | Freq: Every day | ORAL | Status: DC
Start: 2012-05-28 — End: 2012-05-29
  Administered 2012-05-28: 500 mg via ORAL
  Filled 2012-05-28 (×2): qty 1

## 2012-05-28 MED ORDER — FENTANYL CITRATE 0.05 MG/ML IJ SOLN
INTRAMUSCULAR | Status: DC | PRN
  Administered 2012-05-28: 100 ug via INTRAVENOUS
  Administered 2012-05-28 (×3): 50 ug via INTRAVENOUS

## 2012-05-28 MED ORDER — ACETAMINOPHEN 325 MG PO TABS
325.0000 mg | ORAL_TABLET | ORAL | Status: DC | PRN
  Administered 2012-05-28: 325 mg via ORAL
  Filled 2012-05-28: qty 1

## 2012-05-28 MED ORDER — SODIUM CHLORIDE 0.9 % IR SOLN
Status: DC | PRN
  Administered 2012-05-28: 08:00:00

## 2012-05-28 MED ORDER — ONDANSETRON HCL 4 MG/2ML IJ SOLN
INTRAMUSCULAR | Status: DC | PRN
  Administered 2012-05-28: 4 mg via INTRAVENOUS

## 2012-05-28 MED ORDER — ARTIFICIAL TEARS OP OINT
TOPICAL_OINTMENT | OPHTHALMIC | Status: DC | PRN
  Administered 2012-05-28: 1 via OPHTHALMIC

## 2012-05-28 MED ORDER — SODIUM CHLORIDE 0.9 % IV SOLN: INTRAVENOUS | Status: DC

## 2012-05-28 MED ORDER — ALUM & MAG HYDROXIDE-SIMETH 200-200-20 MG/5ML PO SUSP
15.0000 mL | ORAL | Status: DC | PRN
  Administered 2012-05-29: 30 mL via ORAL

## 2012-05-28 MED ORDER — ONDANSETRON HCL 4 MG/2ML IJ SOLN: 4.0000 mg | Freq: Once | INTRAMUSCULAR | Status: DC | PRN

## 2012-05-28 MED ORDER — QUINAPRIL HCL 10 MG PO TABS
40.0000 mg | ORAL_TABLET | Freq: Every day | ORAL | Status: DC
  Administered 2012-05-28: 40 mg via ORAL
  Filled 2012-05-28 (×2): qty 4

## 2012-05-28 MED ORDER — PROPOFOL 10 MG/ML IV BOLUS
INTRAVENOUS | Status: DC | PRN
  Administered 2012-05-28: 20 mg via INTRAVENOUS
  Administered 2012-05-28: 90 mg via INTRAVENOUS
  Administered 2012-05-28: 20 mg via INTRAVENOUS
  Administered 2012-05-28: 200 mg via INTRAVENOUS
  Administered 2012-05-28: 30 mg via INTRAVENOUS
  Administered 2012-05-28 (×2): 20 mg via INTRAVENOUS

## 2012-05-28 MED ORDER — ONDANSETRON HCL 4 MG/2ML IJ SOLN
4.0000 mg | Freq: Four times a day (QID) | INTRAMUSCULAR | Status: DC | PRN
  Administered 2012-05-29: 4 mg via INTRAVENOUS
  Filled 2012-05-28: qty 2

## 2012-05-28 MED ORDER — PROTAMINE SULFATE 10 MG/ML IV SOLN
INTRAVENOUS | Status: DC | PRN
  Administered 2012-05-28 (×7): 10 mg via INTRAVENOUS

## 2012-05-28 MED ORDER — GUAIFENESIN-DM 100-10 MG/5ML PO SYRP: 15.0000 mL | ORAL_SOLUTION | ORAL | Status: DC | PRN

## 2012-05-28 MED ORDER — 0.9 % SODIUM CHLORIDE (POUR BTL) OPTIME
TOPICAL | Status: DC | PRN
  Administered 2012-05-28: 2000 mL

## 2012-05-28 MED ORDER — THROMBIN 20000 UNITS EX SOLR
CUTANEOUS | Status: DC | PRN
  Administered 2012-05-28: 11:00:00 via TOPICAL

## 2012-05-28 MED ORDER — PANTOPRAZOLE SODIUM 40 MG PO TBEC
40.0000 mg | DELAYED_RELEASE_TABLET | Freq: Every day | ORAL | Status: DC
  Administered 2012-05-28: 40 mg via ORAL
  Filled 2012-05-28 (×2): qty 1

## 2012-05-28 SURGICAL SUPPLY — 53 items
CANISTER SUCTION 2500CC (MISCELLANEOUS) ×2 IMPLANT
CANNULA VESSEL W/WING WO/VALVE (CANNULA) ×2 IMPLANT
CATH ROBINSON RED A/P 18FR (CATHETERS) ×2 IMPLANT
CLIP TI MEDIUM 24 (CLIP) ×2 IMPLANT
CLIP TI WIDE RED SMALL 24 (CLIP) ×2 IMPLANT
CLOTH BEACON ORANGE TIMEOUT ST (SAFETY) ×2 IMPLANT
COVER SURGICAL LIGHT HANDLE (MISCELLANEOUS) ×2 IMPLANT
CRADLE DONUT ADULT HEAD (MISCELLANEOUS) ×2 IMPLANT
DECANTER SPIKE VIAL GLASS SM (MISCELLANEOUS) IMPLANT
DERMABOND ADVANCED (GAUZE/BANDAGES/DRESSINGS) ×1
DERMABOND ADVANCED .7 DNX12 (GAUZE/BANDAGES/DRESSINGS) ×1 IMPLANT
DRAIN HEMOVAC 1/8 X 5 (WOUND CARE) IMPLANT
DRAPE WARM FLUID 44X44 (DRAPE) ×2 IMPLANT
ELECT REM PT RETURN 9FT ADLT (ELECTROSURGICAL) ×2
ELECTRODE REM PT RTRN 9FT ADLT (ELECTROSURGICAL) ×1 IMPLANT
EVACUATOR SILICONE 100CC (DRAIN) IMPLANT
GEL ULTRASOUND 20GR AQUASONIC (MISCELLANEOUS) IMPLANT
GLOVE BIO SURGEON STRL SZ7.5 (GLOVE) ×2 IMPLANT
GLOVE BIOGEL M 6.5 STRL (GLOVE) ×4 IMPLANT
GLOVE BIOGEL PI IND STRL 6.5 (GLOVE) ×4 IMPLANT
GLOVE BIOGEL PI IND STRL 7.5 (GLOVE) ×2 IMPLANT
GLOVE BIOGEL PI INDICATOR 6.5 (GLOVE) ×4
GLOVE BIOGEL PI INDICATOR 7.5 (GLOVE) ×2
GLOVE SURG SS PI 7.5 STRL IVOR (GLOVE) ×4 IMPLANT
GOWN PREVENTION PLUS XLARGE (GOWN DISPOSABLE) ×2 IMPLANT
GOWN STRL NON-REIN LRG LVL3 (GOWN DISPOSABLE) ×8 IMPLANT
KIT BASIN OR (CUSTOM PROCEDURE TRAY) ×2 IMPLANT
KIT ROOM TURNOVER OR (KITS) ×2 IMPLANT
LOOP VESSEL MINI RED (MISCELLANEOUS) IMPLANT
NEEDLE HYPO 25GX1X1/2 BEV (NEEDLE) IMPLANT
NS IRRIG 1000ML POUR BTL (IV SOLUTION) ×4 IMPLANT
PACK CAROTID (CUSTOM PROCEDURE TRAY) ×2 IMPLANT
PAD ARMBOARD 7.5X6 YLW CONV (MISCELLANEOUS) ×4 IMPLANT
PATCH HEMASHIELD 8X75 (Vascular Products) ×2 IMPLANT
SHUNT CAROTID BYPASS 10 (VASCULAR PRODUCTS) ×2 IMPLANT
SHUNT CAROTID BYPASS 12FRX15.5 (VASCULAR PRODUCTS) IMPLANT
SPECIMEN JAR SMALL (MISCELLANEOUS) IMPLANT
SPONGE SURGIFOAM ABS GEL 100 (HEMOSTASIS) IMPLANT
SUT ETHILON 3 0 PS 1 (SUTURE) IMPLANT
SUT PROLENE 6 0 CC (SUTURE) ×4 IMPLANT
SUT PROLENE 7 0 BV 1 (SUTURE) IMPLANT
SUT SILK 3 0 (SUTURE) ×1
SUT SILK 3 0 TIES 17X18 (SUTURE)
SUT SILK 3-0 18XBRD TIE 12 (SUTURE) ×1 IMPLANT
SUT SILK 3-0 18XBRD TIE BLK (SUTURE) IMPLANT
SUT VIC AB 3-0 SH 27 (SUTURE) ×2
SUT VIC AB 3-0 SH 27X BRD (SUTURE) ×1 IMPLANT
SUT VICRYL 4-0 PS2 18IN ABS (SUTURE) ×2 IMPLANT
SYR CONTROL 10ML LL (SYRINGE) IMPLANT
TOWEL OR 17X24 6PK STRL BLUE (TOWEL DISPOSABLE) ×4 IMPLANT
TOWEL OR 17X26 10 PK STRL BLUE (TOWEL DISPOSABLE) ×2 IMPLANT
TRAY FOLEY CATH 14FRSI W/METER (CATHETERS) ×2 IMPLANT
WATER STERILE IRR 1000ML POUR (IV SOLUTION) ×2 IMPLANT

## 2012-05-28 NOTE — Transfer of Care (Signed)
Immediate Anesthesia Transfer of Care Note  Patient: Evan [name]  Procedure(s) Performed: Procedure(s): ENDARTERECTOMY CAROTID (Left) WITH dACRON PATCH ANGIOPLASTY (Left)  Patient Location: PACU  Anesthesia Type:General  Level of Consciousness: awake, alert , pateint uncooperative and confused  Airway & Oxygen Therapy: Patient Spontanous Breathing and Patient connected to nasal cannula oxygen  Post-op Assessment: Report given to PACU RN, Post -op Vital signs reviewed and stable, Patient moving all extremities and focused on needing to "pee"  Post vital signs: Reviewed and stable  Complications: No apparent anesthesia complications

## 2012-05-28 NOTE — Anesthesia Preprocedure Evaluation (Addendum)
Anesthesia Evaluation  Patient identified by MRN, date of birth, ID band Patient awake    Reviewed: Allergy & Precautions, H&P , NPO status , Patient's Chart, lab work & pertinent test results, reviewed documented beta blocker date and time   Airway Mallampati: I TM Distance: >3 FB Neck ROM: Full    Dental  (+) Edentulous Lower, Edentulous Upper and Dental Advisory Given   Pulmonary          Cardiovascular hypertension, Pt. on home beta blockers + CAD, + CABG and + Peripheral Vascular Disease + dysrhythmias     Neuro/Psych    GI/Hepatic   Endo/Other  diabetes, Well Controlled, Type 2  Renal/GU      Musculoskeletal   Abdominal   Peds  Hematology   Anesthesia Other Findings   Reproductive/Obstetrics                          Anesthesia Physical Anesthesia Plan  ASA: III  Anesthesia Plan: General   Post-op Pain Management:    Induction: Intravenous  Airway Management Planned: Oral ETT  Additional Equipment: Arterial line  Intra-op Plan:   Post-operative Plan: Extubation in OR  Informed Consent: I have reviewed the patients History and Physical, chart, labs and discussed the procedure including the risks, benefits and alternatives for the proposed anesthesia with the patient or authorized representative who has indicated his/her understanding and acceptance.     Plan Discussed with: CRNA, Anesthesiologist and Surgeon  Anesthesia Plan Comments:         Anesthesia Quick Evaluation

## 2012-05-28 NOTE — Op Note (Signed)
Procedure Left carotid endarterectomy Preoperative diagnosis: High-grade asymptomatic left internal carotid artery stenosis  Postoperative diagnosis: Same  Anesthesia General  Asst.: Doreatha Massed, PAC  Operative findings: #1 greater than 80% left internal carotid artery stenosis with calcified plaque          #2 10 French shunt            #3 Dacron patch          #4 high carotid bifurcation  Operative details: After obtaining informed consent, the patient was taken to the operating room. The patient was placed in a supine position on the operating room table. After induction of general anesthesia and endotracheal intubation a Foley catheter was placed. Next the patient's entire neck and chest was prepped and draped in the usual sterile fashion. An oblique incision was made on the left aspect of the patient's neck anterior to the border the left sternocleidomastoid muscle. The incision was carried into the subcutaneous tissues and through the platysma. The sternocleidomastoid muscle was identified and reflected laterally. The omohyoid muscle was identified and this was divided with cautery. The common carotid artery was then found at the base of the incision this was dissected free circumferentially. The vagus nerve was identified and protected. Dissection was then carried up to the level carotid bifurcation. The Ansa cervicalis was identified and traced up to its insertion into the hypoglossal nerve.  The ansa was divided at this level to assist with exposure.  The hyperglossal nerve was identified and protected.  A vessel loop was placed around this for some retraction as the bifurcation was fairly high in a patient with a short neck.  A portion of the posterior belly of the digastric as well as the occipital branch of the external carotid were divided to provide additional exposure.   The internal carotid artery was dissected free circumferentially just below the level of the hypoglossal nerve and  it was soft in character at this location and above any palpable disease. A vessel loop was placed around this. Next the external carotid and superior thyroid arteries were dissected free circumferentially and vessel loops were placed around these. The patient was given 10000 units of intravenous heparin. After 2 minutes of circulation time and raising the mean arterial pressure to 90 mm mercury, the distal internal carotid artery was controlled with small bulldog clamp. The external carotid and superior thyroid arteries were controlled with vessel loops. The common carotid artery was controlled with a peripheral DeBakey clamp. A longitudinal opening was made in the common carotid artery just below the bifurcation. The arteriotomy was extended distally up into the internal carotid with Potts scissors. There was a large calcified plaque with greater than 80% stenosis at the carotid bifurcation. The arteriotomy was extended past the level of stenosis. Next a 10 [place] shunt was brought in the operative field was threaded into the distal internal carotid artery and allowed to backbleed thoroughly. There was brisk backbleeding from the internal carotid artery around the shunt requiring a distal Javid shunt clamp.  This shunt was then threaded into the common carotid artery and secured with a Rummel tourniquet. The proximal common carotid was fairly calcified and required a proximal shunt clamp to keep blood from leaking around the shunt.  The shunt was inspected and found to be free of air. Flow was then restored to the brain after approximately 6 minutes of ischemia time.   Attention was then turned to the common carotid artery once again. A suitable endarterectomy  plane was obtained and endarterectomy was begun in the common carotid artery and a good proximal endpoint was obtained. An eversion endarterectomy was performed on the external carotid artery and a good endpoint was obtained. The plaque was then elevated in  the internal carotid artery and a nice feathered distal endpoint was also obtained. The plaque was passed off the table. All loose debris was then removed from the carotid bed and everything was thoroughly irrigated with heparinized saline. A Dacron patch was then brought on to the operative field and this was sewn on as a patch angioplasty using a running 6-0 Prolene suture. Prior to completion of the anastomosis the shunt was reoccluded and brought down out of the distal internal carotid artery. The internal carotid artery was thoroughly backbled. This was then controlled again with small bulldog clamp. The shunt was then removed from the proximal common carotid artery and this is again secured with a peripheral DeBakey clamp after forward bleeding. The external carotid was also thoroughly backbled.  The remainder of the patch was completed and the anastomosis was secured. Flow was then restored first retrograde from the external carotid into the carotid bed then antegrade from the common carotid to the external carotid artery and after approximately 5 cardiac cycles to the internal carotid artery. Doppler was used to evaluate the external/internal and common carotid arteries and these all had good Doppler flow. Hemostasis was obtained with an additional repair stitch at the toe of the patch on the common carotid artery as well as thrombin and gelfoam. The patient had been given an additional 5000 units of heparin and this was reversed with 70 mg of protamine.  At conclusion of the case the thrombin and gelfoam were removed from the neck incision.  The platysma muscle was reapproximated using a running 3-0 Vicryl suture. The skin was closed with 4 0 Vicryl subcuticular stitch.  The patient was awakened in the operating room and was moving upper and lower extremities symmetrically and following commands.  The patient was stable on arrival to the PACU.  Fabienne Bruns, MD [facility]  [place] Office: [phone] Pager: [phone]

## 2012-05-28 NOTE — H&P (View-Only) (Signed)
[facility] [place] HISTORY AND PHYSICAL   History of Present Illness:  Patient is a 72 y.o. 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  He previously underwent right carotid endarterectomy January 2010. He has done well since then. He returns for routine carotid followup today. He denies any symptoms of TIA amaurosis or stroke. He does complain intermittently of some left calf pain when walking long distances. This resolves within a few minutes rest or ibuprofen. He denies rest pain in the feet. He has been getting dyspnea with exertion. He also states he gets sometimes short of breath after eating a large meal and then working. Other medical problems include coronary artery disease, hypertension, hyperlipidemia, obesity, diabetes, arthritis all of which are currently stable. He is on once daily aspirin. He is also on a statin.  Past Medical History  Diagnosis Date  . Coronary artery disease   . Hyperlipidemia   . Hypertension   . Obesity   . Carotid arterial disease   . Dyspnea   . Diabetes mellitus   . Torn rotator cuff 2012     Right arm  . Osteoarthritis     Left wrist    Past Surgical History  Procedure Laterality Date  . Carotid endarterectomy    . Back surgery      X2  . Mole surgery    . Coronary artery bypass graft  03/2003    X3. LIMA GRAFT TO THE LAD, SAPHENOUS VEIN GRAFT TO THE PA, AND A LEFT RADIAL GRAFT TO THEOBTUSE MARGINAL VESSEL  . Cardiac catheterization  11/29/2005    SEVERE THREE VESSEL OBSTRUCTIVE ATHERSCLEROTIC CAD. ALL GRAFTS WERE PATENT. EF 65%     Social History History  Substance Use Topics  . Smoking status: Former Smoker    Quit date: 01/24/1994  . Smokeless tobacco: Not on file  . Alcohol Use: No    Family History Family History  Problem Relation Age of Onset  . Heart attack Father   . Heart attack Brother   . Parkinsonism Brother   . Cancer Brother   . Arrhythmia Brother      Allergies  No Known Allergies   Current Outpatient Prescriptions  Medication Sig Dispense Refill  . aspirin 81 MG tablet Take 81 mg by mouth daily.        . hydrochlorothiazide (HYDRODIURIL) 25 MG tablet Take 25 mg by mouth daily.      . ibuprofen (ADVIL,MOTRIN) 200 MG tablet Take 200 mg by mouth every 6 (six) hours as needed.        . metFORMIN (GLUCOPHAGE) 500 MG tablet Take 500 mg by mouth 2 (two) times daily with a meal.       . metoprolol (TOPROL-XL) 100 MG 24 hr tablet Take 50 mg by mouth daily.       . niacin (NIASPAN) 500 MG CR tablet Take 500 mg by mouth at bedtime.        . quinapril (ACCUPRIL) 40 MG tablet Take 40 mg by mouth at bedtime.        . simvastatin (ZOCOR) 40 MG tablet Take 40 mg by mouth at bedtime.        . Soft Lens Products (OPTI-FREE REPLENISH) SOLN by Does not apply route. Pt takes three tabs daily for knees       No current facility-administered medications for this visit.    ROS:   General:  No weight loss, Fever, chills  HEENT: No recent   headaches, no nasal bleeding, no visual changes, no sore throat  Neurologic: No dizziness, blackouts, seizures. No recent symptoms of stroke or mini- stroke. No recent episodes of slurred speech, or temporary blindness.  Cardiac: No recent episodes of chest pain/pressure, no shortness of breath at rest.  No shortness of breath with exertion.  Denies history of atrial fibrillation or irregular heartbeat  Vascular: No history of rest pain in feet.  No history of claudication.  No history of non-healing ulcer, No history of DVT   Pulmonary: No home oxygen, no productive cough, no hemoptysis,  No asthma or wheezing  Musculoskeletal:  [ ] Arthritis, [ ] Low back pain,  [ ] Joint pain  Hematologic:No history of hypercoagulable state.  No history of easy bleeding.  No history of anemia  Gastrointestinal: No hematochezia or melena,  No gastroesophageal reflux, no trouble swallowing  Urinary: [ ] chronic Kidney  disease, [ ] on HD - [ ] MWF or [ ] TTHS, [ ] Burning with urination, [ ] Frequent urination, [ ] Difficulty urinating;   Skin: No rashes  Psychological: No history of anxiety,  No history of depression   Physical Examination  Filed Vitals:   05/17/12 1151  BP: 170/60  Pulse: 55  Resp: 18  Height: 6' (1.829 m)  Weight: 267 lb (121.11 kg)  SpO2: 98%    Body mass index is 36.2 kg/(m^2).  General:  Alert and oriented, no acute distress HEENT: Normal Neck: Left-sided carotid bruit Pulmonary: Clear to auscultation bilaterally Cardiac: Regular Rate and Rhythm without murmur Abdomen: Soft, non-tender, non-distended, no mass Skin: No rash Extremity Pulses:  2+ radial, brachial, femoral, posterior tibial pulses bilaterally Musculoskeletal: No deformity or edema  Neurologic: Upper and lower extremity motor 5/5 and symmetric  DATA: Carotid duplex was performed in our office today. I reviewed and interpreted this study. This shows a high-grade left internal carotid artery stenosis with peak systolic velocity 377 cm/s and diastolic 127 also noted was a high carotid bifurcation. He was also noted to have a 50 millimeter gradient with his left arm pressure being significantly higher than his right 158 compared to 110 on the right.  For VQI Use Only   PRE-ADM LIVING: Home  AMB STATUS: Ambulatory  CAD Sx: dyspnea on exertion  PRIOR CHF: None  STRESS TEST: [ ] No, [ ] Normal, [ ] + ischemia, [ ] + MI, [ ] Both , pending   ASSESSMENT: High-grade asymptomatic left internal carotid artery stenosis. The patient would benefit from left carotid endarterectomy for stroke prophylaxis. 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   PLAN:  We will schedule the patient to be evaluated by Dr. [name] Jordan for preoperative cardiac risk stratification. He will continue his aspirin and cholesterol lowering agents. He is tentatively scheduled for left carotid endarterectomy on  May 5 pending the outcome of his cardiac evaluation. Risks benefits possible complications and procedure details were described to the patient and his wife today. These include but are not limited to bleeding infection stroke risk 1-2% cranial nerve injury 10-15%. I also discussed with the patient today he does have a high carotid bifurcation which may make some neuropraxia more likely.  Linnie Mcglocklin, MD [facility] [place] Office: [phone] Pager: [phone]  

## 2012-05-28 NOTE — Anesthesia Postprocedure Evaluation (Signed)
  Anesthesia Post-op Note  Patient: Evan [name]  Procedure(s) Performed: Procedure(s): ENDARTERECTOMY CAROTID (Left) WITH dACRON PATCH ANGIOPLASTY (Left)  Patient Location: PACU  Anesthesia Type:General  Level of Consciousness: awake, oriented and patient cooperative  Airway and Oxygen Therapy: Patient Spontanous Breathing  Post-op Pain: mild  Post-op Assessment: Post-op Vital signs reviewed, Patient's Cardiovascular Status Stable, Respiratory Function Stable, Patent Airway, No signs of Nausea or vomiting and Pain level controlled  Post-op Vital Signs: stable  Complications: No apparent anesthesia complications

## 2012-05-28 NOTE — Telephone Encounter (Signed)
Returned call to wife, she had called office on Saturday and talked to the answering service 05/26/12 asking if patient was to discontinue his daily aspirin before scheduled surgery 05/28/12. I received the  answering service list on Monday morning and called the number which was the wife's cell #.  Patient was already in surgery as stated by wife.

## 2012-05-28 NOTE — Anesthesia Procedure Notes (Signed)
Procedure Name: Intubation Date/Time: 05/28/2012 7:44 AM Performed by: Marni Griffon Pre-anesthesia Checklist: Patient identified, Emergency Drugs available, Suction available and Patient being monitored Patient Re-evaluated:Patient Re-evaluated prior to inductionOxygen Delivery Method: Circle system utilized Preoxygenation: Pre-oxygenation with 100% oxygen Intubation Type: IV induction Ventilation: Mask ventilation without difficulty Laryngoscope Size: Mac and 4 Grade View: Grade I Tube type: Oral Tube size: 7.5 mm Number of attempts: 1 Placement Confirmation: ETT inserted through vocal cords under direct vision,  breath sounds checked- equal and bilateral and positive ETCO2 Secured at: 22 (cm at gums) cm Tube secured with: Tape Dental Injury: Teeth and Oropharynx as per pre-operative assessment

## 2012-05-28 NOTE — Interval H&P Note (Signed)
History and Physical Interval Note:  05/28/2012 7:29 AM  Domingo Pulse  has presented today for surgery, with the diagnosis of Left Internal Carotid Artery Stenosis   The various methods of treatment have been discussed with the patient and family. After consideration of risks, benefits and other options for treatment, the patient has consented to  Procedure(s): ENDARTERECTOMY CAROTID (Left) as a surgical intervention .  The patient's history has been reviewed, patient examined, no change in status, stable for surgery.  I have reviewed the patient's chart and labs.  Questions were answered to the patient's satisfaction.     Jerrie Schussler E

## 2012-05-28 NOTE — Telephone Encounter (Addendum)
Message copied by Rosalyn Charters on Mon May 28, 2012  4:01 PM ------      Message from: [place], [place] K      Created: Mon May 28, 2012  1:13 PM      Regarding: schedule                   ----- Message -----         From: Dara Lords, PA-C         Sent: 05/28/2012  11:20 AM           To: Sharee Pimple, CMA            S/p left CEA 05/28/12.  F/u with Dr. Darrick Penna in 2 weeks.            Thanks,      Samantha ------ notified pt. of fu appt. on 06-14-12 9:15 with dr.  Darrick Penna

## 2012-05-28 NOTE — Progress Notes (Signed)
Pt arrived from PACU, arousable, neuro intact, VSS. Will continue to monitor.

## 2012-05-28 NOTE — Progress Notes (Signed)
Utilization review completed.  

## 2012-05-28 NOTE — Preoperative (Signed)
Beta Blockers   Reason not to administer Beta Blockers:Toprol 0400 today

## 2012-05-29 ENCOUNTER — Telehealth: Payer: Self-pay | Admitting: Vascular Surgery

## 2012-05-29 LAB — BASIC METABOLIC PANEL
Calcium: 8.6 mg/dL (ref 8.4–10.5)
GFR calc Af Amer: >90 mL/min (ref >90)
GFR calc non Af Amer: 82 mL/min — ABNORMAL LOW (ref >90)
Potassium: 3.8 mEq/L (ref 3.5–5.1)
Sodium: 137 mEq/L (ref 135–145)

## 2012-05-29 LAB — CBC
Hemoglobin: 11.8 g/dL — ABNORMAL LOW (ref 13.0–17.0)
MCH: 29.5 pg (ref 26.0–34.0)
MCHC: 35.1 g/dL (ref 30.0–36.0)
Platelets: 165 10*3/uL (ref 150–400)
RDW: 13.1 % (ref 11.5–15.5)

## 2012-05-29 LAB — HEMOGLOBIN A1C
Hgb A1c MFr Bld: 6.2 % — ABNORMAL HIGH (ref <5.7)
Mean Plasma Glucose: 131 mg/dL — ABNORMAL HIGH (ref <117)

## 2012-05-29 NOTE — Progress Notes (Signed)
05/29/12 0017  Vitals  BP ! 174/67 mmHg (10 mg hydralizine given per md order)

## 2012-05-29 NOTE — Progress Notes (Signed)
Pt to be d/c home this am, VSS, voided. Reviewed instructions w/Pt and family, they indicated understanding.

## 2012-05-29 NOTE — Progress Notes (Addendum)
[facility] Progress Note  05/29/2012 7:31 AM POD 1  Subjective:  Pt states his throat is sore-feels like theres a flap in there. No trouble swallowing liquids.  Afebrile 150's-170s systolic HR 60's-80's regular 78% 2LO2NC  Filed Vitals:   05/29/12 0708  BP:   Pulse:   Temp: 98.7 F (37.1 C)  Resp:      Physical Exam: Neuro:  In tact. Incision:  C/d/i with some edema, but soft around incision.    CBC    Component Value Date/Time   WBC 10.8* 05/29/2012 0427   RBC 4.00* 05/29/2012 0427   HGB 11.8* 05/29/2012 0427   HCT 33.6* 05/29/2012 0427   PLT 165 05/29/2012 0427   MCV 84.0 05/29/2012 0427   MCH 29.5 05/29/2012 0427   MCHC 35.1 05/29/2012 0427   RDW 13.1 05/29/2012 0427    BMET    Component Value Date/Time   NA 137 05/29/2012 0427   K 3.8 05/29/2012 0427   CL 100 05/29/2012 0427   CO2 27 05/29/2012 0427   GLUCOSE 207* 05/29/2012 0427   BUN 12 05/29/2012 0427   CREATININE 0.97 05/29/2012 0427   CALCIUM 8.6 05/29/2012 0427   GFRNONAA 82* 05/29/2012 0427   GFRAA >90 05/29/2012 0427     Intake/Output Summary (Last 24 hours) at 05/29/12 0731 Last data filed at 05/29/12 4696  Gross per 24 hour  Intake   3170 ml  Output   2410 ml  Net    760 ml      Assessment/Plan:  This is a 72 y.o. male who is s/p High-grade asymptomatic left internal carotid artery stenosis   1 Day Post-Op  -pt is doing well this am. -pt neuro exam is in tact -pt has ambulated -Pt has not voided-will need to void before discharge. -pt has not had BM since Sunday-refuses dulcalox this am-states he will get something at the store when filling Rx.  Doreatha Massed, PA-C [facility] [phone]  Voice slightly muffled.  No hematoma.  Tongue midline.  No dysphagia. Neuro UE/LE 5/5 motor D/c home  Fabienne Bruns, MD [facility] [place] Office: [phone] Pager: [phone]

## 2012-05-29 NOTE — Discharge Summary (Signed)
[facility] Discharge Summary  Evan [name] 06/28/1940 72 y.o. male  [id number]  Admission Date: 05/28/2012  Discharge Date: 05/29/12  Physician: Sherren Kerns, MD  Admission Diagnosis: Left Internal Carotid Artery Stenosis    HPI:   This is a 72 y.o. 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 He previously underwent right carotid endarterectomy January 2010. He has done well since then. He returns for routine carotid followup today. He denies any symptoms of TIA amaurosis or stroke. He does complain intermittently of some left calf pain when walking long distances. This resolves within a few minutes rest or ibuprofen. He denies rest pain in the feet. He has been getting dyspnea with exertion. He also states he gets sometimes short of breath after eating a large meal and then working. Other medical problems include coronary artery disease, hypertension, hyperlipidemia, obesity, diabetes, arthritis all of which are currently stable. He is on once daily aspirin. He is also on a statin.   Hospital Course:  The patient was admitted to the hospital and taken to the operating room on 05/28/2012 and underwent left carotid endarterectomy.  The pt tolerated the procedure well and was transported to the PACU in good condition.   By POD 1, the pt neuro status is in tact.  His voice is slightly muffled, there is no hematoma, tongue is midline and there is no dysphagia.  He is discharged home.  The remainder of the hospital course consisted of increasing mobilization and increasing intake of solids without difficulty.    Recent Labs  05/29/12 0427  NA 137  K 3.8  CL 100  CO2 27  GLUCOSE 207*  BUN 12  CALCIUM 8.6    Recent Labs  05/29/12 0427  WBC 10.8*  HGB 11.8*  HCT 33.6*  PLT 165   No results found for this basename: INR,  in the last 72 hours  Discharge Instructions:   The patient is discharged to home with extensive instructions on  wound care and progressive ambulation.  They are instructed not to drive or perform any heavy lifting until returning to see the physician in his office.  Discharge Orders   Future Appointments Provider Department Dept Phone   06/14/2012 9:15 AM Sherren Kerns, MD [facility] [phone]   Future Orders Complete By Expires     CAROTID Sugery: Call MD for difficulty swallowing or speaking; weakness in arms or legs that is a new symtom; severe headache.  If you have increased swelling in the neck and/or  are having difficulty breathing, CALL 911  As directed     Call MD for:  redness, tenderness, or signs of infection (pain, swelling, bleeding, redness, odor or green/yellow discharge around incision site)  As directed     Call MD for:  severe or increased pain, loss or decreased feeling  in affected limb(s)  As directed     Call MD for:  temperature >100.5  As directed     Discharge wound care:  As directed     Comments:      Shower daily with soap and water starting 05/29/12    Driving Restrictions  As directed     Comments:      No driving for 2 weeks    Lifting restrictions  As directed     Comments:      No lifting for 4 weeks    Nursing communication  As directed     Scheduling  Instructions:      Please give paper Rx to patient at discharge.  It has been placed on the chart.    Resume previous diet  As directed        Discharge Diagnosis:  Left Internal Carotid Artery Stenosis   Secondary Diagnosis: Patient Active Problem List   Diagnosis Date Noted  . Occlusion and stenosis of carotid artery without mention of cerebral infarction 04/14/2011  . Shoulder pain 04/14/2011  . 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 Diabetes mellitus 02/01/2011  . Coronary artery disease   . Hyperlipidemia   . Hypertension   . Obesity   . Carotid arterial disease    Past Medical History  Diagnosis Date  . Coronary  artery disease   . Hyperlipidemia   . Hypertension   . Obesity   . Carotid arterial disease   . Diabetes mellitus   . Torn rotator cuff 2012     Right arm  . Osteoarthritis     Left wrist  . Heart murmur   . Dyspnea     with exertion  . HOH (hard of hearing)   . Frequency of urination   . Bruises easily       Medication List    TAKE these medications       aspirin EC 81 MG tablet  Take 81 mg by mouth every morning.     hydrochlorothiazide 25 MG tablet  Commonly known as:  HYDRODIURIL  Take 25 mg by mouth every morning.     ibuprofen 200 MG tablet  Commonly known as:  ADVIL,MOTRIN  Take 200-400 mg by mouth every 8 (eight) hours as needed for pain.     metFORMIN 500 MG tablet  Commonly known as:  GLUCOPHAGE  Take 500 mg by mouth daily with breakfast.     metoprolol succinate 50 MG 24 hr tablet  Commonly known as:  TOPROL-XL  Take 50 mg by mouth every morning. Take with or immediately following a meal.     niacin 500 MG CR tablet  Commonly known as:  NIASPAN  Take 500 mg by mouth at bedtime.     OVER THE COUNTER MEDICATION  Take 3 tablets by mouth daily with supper. Replenex for joint strength     oxyCODONE 5 MG immediate release tablet  Commonly known as:  ROXICODONE  Take 1 tablet (5 mg total) by mouth every 6 (six) hours as needed for pain.     quinapril 40 MG tablet  Commonly known as:  ACCUPRIL  Take 40 mg by mouth daily.     simvastatin 40 MG tablet  Commonly known as:  ZOCOR  Take 40 mg by mouth at bedtime.        Roxicodone #30 No Refill  Disposition: home   Patient's condition: is Good  Follow up: 1. Dr.  Darrick Penna in 2 weeks.   Doreatha Massed, PA-C [facility] [phone]  --- For [facility] use --- Instructions: Press F2 to tab through selections.  Delete question if not applicable.   Modified Rankin score at D/C (0-6): 0  IV medication needed for:  1. Hypertension: No 2. Hypotension: No  Post-op  Complications: No  1. Post-op CVA or TIA: No  If yes: Event classification (right eye, left eye, right cortical, left cortical, verterobasilar, other): n/a  If yes: Timing of event (intra-op, <6 hrs post-op, >=6 hrs post-op, unknown): n/a  2. CN injury: No  If yes:  CN n/a injuried   3. Myocardial infarction: No  If yes: Dx by (EKG or clinical, Troponin): n/a  4.  CHF: No  5.  Dysrhythmia (new): No  6. Wound infection: No  7. Reperfusion symptoms: No  8. Return to OR: No  If yes: return to OR for (bleeding, neurologic, other CEA incision, other): n/a  Discharge medications: Statin use:  Yes If No: [ ]  For Medical reasons, [ ]  Non-compliant, [ ]  Not-indicated ASA use:  Yes  If No: [ ]  For Medical reasons, [ ]  Non-compliant, [ ]  Not-indicated Beta blocker use:  Yes If No: [ ]  For Medical reasons, [ ]  Non-compliant, [ ]  Not-indicated ACE-Inhibitor use:  Yes If No: [ ]  For Medical reasons, [ ]  Non-compliant, [ ]  Not-indicated P2Y12 Antagonist use: no, [ ]  Plavix, [ ]  Plasugrel, [ ]  Ticlopinine, [ ]  Ticagrelor, [ ]  Other, [ ]  No for medical reason, [ ]  Non-compliant, [ ]  Not-indicated Anti-coagulant use:  no, [ ]  Warfarin, [ ]  Rivaroxaban, [ ]  Dabigatran, [ ]  Other, [ ]  No for medical reason, [ ]  Non-compliant, [ x] Not-indicated

## 2012-05-29 NOTE — Telephone Encounter (Addendum)
Message copied by Rosalyn Charters on Tue May 29, 2012 11:12 AM ------      Message from: Melene Plan      Created: Tue May 29, 2012  9:30 AM                   ----- Message -----         From: Sherren Kerns, MD         Sent: 05/29/2012   8:07 AM           To: Melene Plan, RN, [facility]            Pt going out of town soon.  Can you please schedule his post op visit for May 15            Fabienne Bruns, MD      [facility] [place]      Office: [phone]      Pager: [phone]       ------  notified pt. of change in fu appt. to 06-07-12 11:45

## 2012-05-30 ENCOUNTER — Encounter (HOSPITAL_COMMUNITY): Payer: Self-pay | Admitting: Vascular Surgery

## 2012-05-30 ENCOUNTER — Telehealth: Payer: Self-pay | Admitting: *Deleted

## 2012-05-30 NOTE — Telephone Encounter (Signed)
Wife called re: pain meds are not lasting at night and could pt take a 325mg  Tylenol intermittently.( S/P left carotid endarterectomy on 05-28-12 by Dr. Darrick Penna). I instructed her on the correct amounts to take in order to avoid kidney/liver problems. She says he will try to take the Tylenol during the day and the oxycodone at night. Also, patient is having severe flushing and "feels like his skin is burning" when he takes the Niacin. I told her to stop this medicine and discuss alternate with Dr. Darrick Penna at next weeks appt. (06-07-12) Wife states that patient has been afebrile and has no drainage or erythema around the incision. He is eating and swallowing with no difficulty, speech is clear.

## 2012-06-04 ENCOUNTER — Encounter: Payer: Self-pay | Admitting: Surgery

## 2012-06-04 ENCOUNTER — Ambulatory Visit (INDEPENDENT_AMBULATORY_CARE_PROVIDER_SITE_OTHER): Payer: Medicare Other | Admitting: Surgery

## 2012-06-04 DIAGNOSIS — I6529 Occlusion and stenosis of unspecified carotid artery: Secondary | ICD-10-CM

## 2012-06-04 NOTE — Progress Notes (Signed)
The patient is seen as an add on today. He is recently status post left carotid endarterectomy by Dr. Darrick Penna. He has been concerned because there was a small amount of bloody drainage on his pillow. In addition, this side is more so than when the other side was done, and there is more swelling. He reports no fevers or chills. He reports no neurologic changes.  On examination there is some fullness to the left neck however this is not very impressive. His incision is healing nicely. I see no evidence of infection.  I have reassured the patient that he is healing nicely. He will keep his regular scheduled appointment with Dr. Darrick Penna. I also told him that there is no role for antibiotics and the situation.

## 2012-06-06 ENCOUNTER — Encounter: Payer: Self-pay | Admitting: Vascular Surgery

## 2012-06-07 ENCOUNTER — Encounter: Payer: Self-pay | Admitting: Vascular Surgery

## 2012-06-07 ENCOUNTER — Ambulatory Visit (INDEPENDENT_AMBULATORY_CARE_PROVIDER_SITE_OTHER): Payer: Medicare Other | Admitting: Vascular Surgery

## 2012-06-07 DIAGNOSIS — Z48812 Encounter for surgical aftercare following surgery on the circulatory system: Secondary | ICD-10-CM

## 2012-06-07 DIAGNOSIS — I6529 Occlusion and stenosis of unspecified carotid artery: Secondary | ICD-10-CM

## 2012-06-07 NOTE — Progress Notes (Signed)
[facility] [place] HISTORY AND PHYSICAL    History of Present Illness:  Patient is a 72 y.o. year old male who presents for post-operative follow-up after left carotid endarterectomy.  Denies headaches, numbness, tingling or other neuro deficits.  No swallowing problems.  No incisional drainage.  He did have some swelling in the left side of his neck which is improving.   Physical Examination  Filed Vitals:   06/07/12 1146  BP: 154/75  Pulse: 63  Resp:     Body mass index is 35.12 kg/(m^2).  General:  Alert and oriented, no acute distress Neck: No bruit or JVD, healing left neck incision with some mild edema no focal hematoma Skin: No rash Neurologic: Upper and lower extremity motor 5/5 and symmetric  ASSESSMENT: Doing well status post left carotid endarterectomy   PLAN:  He will continue his aspirin and statin. He will followup in 6 months time with repeat carotid duplex.   Fabienne Bruns, MD [facility] [place] Office: [phone] Pager: [phone]

## 2012-06-07 NOTE — Addendum Note (Signed)
Addended by: Sharee Pimple on: 06/07/2012 02:20 PM   Modules accepted: Orders

## 2012-06-14 ENCOUNTER — Ambulatory Visit: Payer: Medicare Other | Admitting: Vascular Surgery

## 2012-08-20 ENCOUNTER — Ambulatory Visit (INDEPENDENT_AMBULATORY_CARE_PROVIDER_SITE_OTHER): Payer: Medicare Other | Admitting: Cardiology

## 2012-08-20 ENCOUNTER — Encounter: Payer: Self-pay | Admitting: Cardiology

## 2012-08-20 VITALS — BP 130/72 | HR 62 | Ht 72.0 in | Wt 261.8 lb

## 2012-08-20 DIAGNOSIS — E785 Hyperlipidemia, unspecified: Secondary | ICD-10-CM

## 2012-08-20 DIAGNOSIS — I452 Bifascicular block: Secondary | ICD-10-CM

## 2012-08-20 DIAGNOSIS — I2510 Atherosclerotic heart disease of native coronary artery without angina pectoris: Secondary | ICD-10-CM

## 2012-08-20 DIAGNOSIS — I10 Essential (primary) hypertension: Secondary | ICD-10-CM

## 2012-08-20 NOTE — Patient Instructions (Signed)
Stop taking Niacin  Continue your other therapy  We will schedule you for dopplers to check the circulation in your legs.

## 2012-08-20 NOTE — Progress Notes (Signed)
Evan Evan [name] Date of Birth: 02-09-1940   History of Present Illness: Evan Evan [name] is seen today for followup. He has a history of coronary disease and is status post coronary bypass surgery in 2005. He had a cardiac catheterization 2007 which demonstrated his grafts were patent. He had a nuclear stress test in January of 2013 which demonstrated no significant perfusion abnormalities. His ejection fraction was estimated 46% but visually it appeared normal. He underwent a left carotid endarterectomy in May. His postoperative course was unremarkable. He does complain of severe flushing on niacin associated with night sweats. He complains of a pain in his right leg extending from his groin down the lateral side of his leg to his foot. He complains that his right leg stays colder than the left. He has lost 3 pounds. He denies any chest discomfort. He does get some lower midabdominal discomfort if he walks after eating.  Current Outpatient Prescriptions on File Prior to Visit  Medication Sig Dispense Refill  . aspirin EC 81 MG tablet Take 81 mg by mouth every morning.      . hydrochlorothiazide (HYDRODIURIL) 25 MG tablet Take 25 mg by mouth every morning.       Marland Kitchen ibuprofen (ADVIL,MOTRIN) 200 MG tablet Take 200-400 mg by mouth every 8 (eight) hours as needed for pain.       . metFORMIN (GLUCOPHAGE) 500 MG tablet Take 500 mg by mouth daily with breakfast.       . metoprolol succinate (TOPROL-XL) 50 MG 24 hr tablet Take 50 mg by mouth every morning. Take with or immediately following a meal.      . OVER THE COUNTER MEDICATION Take 3 tablets by mouth daily with supper. Replenex for joint strength      . quinapril (ACCUPRIL) 40 MG tablet Take 40 mg by mouth daily.       . simvastatin (ZOCOR) 40 MG tablet Take 40 mg by mouth at bedtime.        No current facility-administered medications on file prior to visit.    Allergies  Allergen Reactions  . Niacin And Related Rash    Red face and burns.     Past Medical History  Diagnosis Date  . Coronary artery disease   . Hyperlipidemia   . Hypertension   . Obesity   . Carotid arterial disease   . Diabetes mellitus   . Torn rotator cuff 2012     Right arm  . Osteoarthritis     Left wrist  . Heart murmur   . Dyspnea     with exertion  . HOH (hard of hearing)   . Frequency of urination   . Bruises easily     Past Surgical History  Procedure Laterality Date  . Carotid endarterectomy    . Mole surgery    . Coronary artery bypass graft  03/2003    X3. LIMA GRAFT TO THE LAD, SAPHENOUS VEIN GRAFT TO THE PA, AND A LEFT RADIAL GRAFT TO THEOBTUSE MARGINAL VESSEL  . Cardiac catheterization  11/29/2005    SEVERE THREE VESSEL OBSTRUCTIVE ATHERSCLEROTIC CAD. ALL GRAFTS WERE PATENT. EF 65%  . Back surgery      X2  . Endarterectomy Left 05/28/2012    Procedure: ENDARTERECTOMY CAROTID;  Surgeon: Sherren Kerns, MD;  Location: [facility] OR;  Service: Vascular;  Laterality: Left;  . Patch angioplasty Left 05/28/2012    Procedure: WITH dACRON PATCH ANGIOPLASTY;  Surgeon: Sherren Kerns, MD;  Location: [facility] OR;  Service: Vascular;  Laterality: Left;    History  Smoking status  . Former Smoker  . Quit date: 01/24/1994  Smokeless tobacco  . Not on file    History  Alcohol Use No    Family History  Problem Relation Age of Onset  . Heart attack Father   . Heart attack Brother   . Parkinsonism Brother   . Cancer Brother   . Arrhythmia Brother     Review of Systems:   All other systems were reviewed and are negative.  Physical Exam: BP 130/72  Evan [name] 63  Ht 6' (1.829 m)  Wt 261 lb 12.8 oz (118.752 kg)  BMI 35.5 kg/m2  SpO2 96% He is an obese white male in no acute distress. He is normocephalic, atraumatic. Pupils equal round react to light accommodation. Extraocular movements are full. Oropharynx is clear. Neck is without JVD, adenopathy, thyromegaly, or bruits. Lungs are clear. Cardiac exam reveals a grade 2/6 systolic ejection  murmur the right upper sternal border radiating to the left sternal border. There is no diastolic murmur or S3. Abdomen is obese, soft, nontender. He has no masses or bruits. He has no edema. Femoral and pedal pulses are good.  LABORATORY DATA:    Assessment / Plan: 1. Coronary disease status post CABG in 2005. He has stable class I anginal symptoms. His Myoview study 2013 showed no significant ischemia. We'll continue with his medical therapy including aspirin, metoprolol, and ACE inhibitor. Continue statin therapy.  2. Hyperlipidemia, on simvastatin and niacin. Given the recent data on niacin I recommended he discontinue this medication.  3. Diabetes mellitus type 2. I am encouraged with his 3 pound weight loss. He will continue metformin.  4. Hypertension well controlled today.  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 We'll continue his current dose of metoprolol. If he develops any increase AV block we may need to stop his metoprolol completely.  6. Carotid arterial disease status post left endarterectomy.  7. Right leg pain. Based on his exam I think it is unlikely this is related to arterial insufficiency but we will obtain arterial Dopplers.

## 2012-09-12 ENCOUNTER — Encounter (INDEPENDENT_AMBULATORY_CARE_PROVIDER_SITE_OTHER): Payer: Medicare Other

## 2012-09-12 DIAGNOSIS — I452 Bifascicular block: Secondary | ICD-10-CM

## 2012-09-12 DIAGNOSIS — I739 Peripheral vascular disease, unspecified: Secondary | ICD-10-CM

## 2012-09-12 DIAGNOSIS — I10 Essential (primary) hypertension: Secondary | ICD-10-CM

## 2012-09-12 DIAGNOSIS — I2510 Atherosclerotic heart disease of native coronary artery without angina pectoris: Secondary | ICD-10-CM

## 2012-09-12 DIAGNOSIS — E785 Hyperlipidemia, unspecified: Secondary | ICD-10-CM

## 2012-11-27 ENCOUNTER — Encounter: Payer: Self-pay | Admitting: Cardiology

## 2012-12-13 ENCOUNTER — Ambulatory Visit: Payer: Medicare Other | Admitting: Family

## 2012-12-13 ENCOUNTER — Other Ambulatory Visit: Payer: Medicare Other

## 2012-12-13 ENCOUNTER — Other Ambulatory Visit (HOSPITAL_COMMUNITY): Payer: Medicare Other

## 2012-12-13 ENCOUNTER — Ambulatory Visit: Payer: Medicare Other | Admitting: Vascular Surgery

## 2013-01-09 ENCOUNTER — Encounter: Payer: Self-pay | Admitting: Family

## 2013-01-10 ENCOUNTER — Ambulatory Visit (HOSPITAL_COMMUNITY)
Admission: RE | Admit: 2013-01-10 | Discharge: 2013-01-10 | Disposition: A | Discharge status: Home / Self Care | Payer: Medicare Other | Source: Ambulatory Visit | Attending: Family | Admitting: Family

## 2013-01-10 ENCOUNTER — Encounter (INDEPENDENT_AMBULATORY_CARE_PROVIDER_SITE_OTHER): Payer: Self-pay

## 2013-01-10 ENCOUNTER — Encounter: Payer: Self-pay | Admitting: Family

## 2013-01-10 ENCOUNTER — Ambulatory Visit (INDEPENDENT_AMBULATORY_CARE_PROVIDER_SITE_OTHER): Payer: Medicare Other | Admitting: Family

## 2013-01-10 DIAGNOSIS — I6529 Occlusion and stenosis of unspecified carotid artery: Secondary | ICD-10-CM | POA: Insufficient documentation

## 2013-01-10 DIAGNOSIS — Z48812 Encounter for surgical aftercare following surgery on the circulatory system: Secondary | ICD-10-CM | POA: Insufficient documentation

## 2013-01-10 NOTE — Progress Notes (Signed)
Established Carotid Patient  History of Present Illness  Evan [name] is a 72 y.o. male patient of Dr. Darrick Penna who is status post left carotid endarterectomy on 05/28/12 and right CEA on 01/29/08. He returns today for follow up and carotid artery Duplex.  Patient has Negative history of TIA or stroke symptom.  The patient denies amaurosis fugax or monocular blindness.  The patient  denies facial drooping.  Pt. denies hemiplegia.  The patient denies receptive or expressive aphasia.  Pt. denies extremity weakness. He denies steal symptoms in either arm. He denies claudication symptoms in LE's, denies non-healing wounds.  States since the left CEA he "slobbers" on the right side of his mouth.   Patient denies New Medical or Surgical History.  Pt Diabetic: Yes, borderline Pt smoker: former smoker, quit in 1994  Pt meds include: Statin : No: he had to stop taking due to myalgias Betablocker: Yes ASA: Yes Other anticoagulants/antiplatelets: no   Past Medical History  Diagnosis Date  . Coronary artery disease   . Hyperlipidemia   . Hypertension   . Obesity   . Carotid arterial disease   . Diabetes mellitus   . Torn rotator cuff 2012     Right arm  . Osteoarthritis     Left wrist  . Heart murmur   . Dyspnea     with exertion  . HOH (hard of hearing)   . Frequency of urination   . Bruises easily     Social History History  Substance Use Topics  . Smoking status: Former Smoker    Quit date: 01/24/1994  . Smokeless tobacco: Not on file  . Alcohol Use: No    Family History Family History  Problem Relation Age of Onset  . Heart attack Father   . Heart attack Brother   . Parkinsonism Brother   . Cancer Brother   . Arrhythmia Brother     Surgical History Past Surgical History  Procedure Laterality Date  . Carotid endarterectomy    . Mole surgery    . Coronary artery bypass graft  03/2003    X3. LIMA GRAFT TO THE LAD, SAPHENOUS VEIN GRAFT TO THE PA, AND A LEFT  RADIAL GRAFT TO THEOBTUSE MARGINAL VESSEL  . Cardiac catheterization  11/29/2005    SEVERE THREE VESSEL OBSTRUCTIVE ATHERSCLEROTIC CAD. ALL GRAFTS WERE PATENT. EF 65%  . Back surgery      X2  . Endarterectomy Left 05/28/2012    Procedure: ENDARTERECTOMY CAROTID;  Surgeon: Sherren Kerns, MD;  Location: [facility] OR;  Service: Vascular;  Laterality: Left;  . Patch angioplasty Left 05/28/2012    Procedure: WITH dACRON PATCH ANGIOPLASTY;  Surgeon: Sherren Kerns, MD;  Location: [facility] OR;  Service: Vascular;  Laterality: Left;    Allergies  Allergen Reactions  . Niacin And Related Rash    Red face and burns.    Current Outpatient Prescriptions  Medication Sig Dispense Refill  . aspirin EC 81 MG tablet Take 81 mg by mouth every morning.      . hydrochlorothiazide (HYDRODIURIL) 25 MG tablet Take 25 mg by mouth every morning.       Marland Kitchen ibuprofen (ADVIL,MOTRIN) 200 MG tablet Take 200-400 mg by mouth every 8 (eight) hours as needed for pain.       . metFORMIN (GLUCOPHAGE) 500 MG tablet Take 500 mg by mouth daily with breakfast.       . metoprolol succinate (TOPROL-XL) 50 MG 24 hr tablet Take 50 mg by mouth every  morning. Take with or immediately following a meal.      . OVER THE COUNTER MEDICATION Take 3 tablets by mouth daily with supper. Replenex for joint strength      . quinapril (ACCUPRIL) 40 MG tablet Take 40 mg by mouth daily.       . simvastatin (ZOCOR) 40 MG tablet Take 40 mg by mouth at bedtime.        No current facility-administered medications for this visit.    Review of Systems : See HPI for pertinent positives and negatives.  Physical Examination  Filed Vitals:   01/10/13 1003  BP: 147/73  Pulse: 50  Resp: 14   Filed Weights   01/10/13 1003  Weight: 267 lb (121.11 kg)    General: WDWN male in NAD GAIT: normal Eyes: PERRLA Pulmonary:  CTAB, Negative  Rales, Negative rhonchi, & Negative wheezing.  Cardiac: regular Rhythm with occasional premature beats ,  Positive  Murmurs.  VASCULAR EXAM Carotid Bruits Left Right   Transmitted cardiac murmur transmitted cardiac murmur     Radial pulses: left is not palpable, right is 2+. Left brachial pulse is 3+.                                                                                                                            LE Pulses LEFT RIGHT       POPLITEAL  not palpable   not palpable    Gastrointestinal: soft, nontender, BS WNL, no r/g,  negative masses, large abdomen (central adiposity).  Musculoskeletal: Negative muscle atrophy/wasting. M/S 5/5 throughout, Extremities without ischemic changes.  Neurologic: A&O X 3; Appropriate Affect ; SENSATION ;normal;  Speech is normal CN 2-12 intact except is hard of hearing, Pain and light touch intact in extremities, Motor exam as listed above.   Non-Invasive Vascular Imaging CAROTID DUPLEX 01/10/2013   Patent bilateral ICA's. Right retrograde vertebral flow, left is antegrade.   Assessment: Evan [name] is a 72 y.o. male who presents with asymptomatic patent bilateral ICA's, status post bilateral CEA's. His most prominent risk factor for atherosclerosis is his obesity; a few strategies for weight loss discussed.  Plan: Follow-up in 6 months with Carotid Duplex scan.   I discussed in depth with the patient the nature of atherosclerosis, and emphasized the importance of maximal medical management including strict control of blood pressure, blood glucose, and lipid levels, obtaining regular exercise, and continued cessation of smoking.  The patient is aware that without maximal medical management the underlying atherosclerotic disease process will progress, limiting the benefit of any interventions. The patient was given information about stroke prevention and what symptoms should prompt the patient to seek immediate medical care. Thank you for allowing [place] to participate in this patient's care.  Charisse March, RN, MSN, FNP-C [facility] [place] Office: [phone]  Clinic Physician: Darrick Penna  01/10/2013 9:37 AM

## 2013-01-10 NOTE — Patient Instructions (Addendum)
Stroke Prevention Some medical conditions and behaviors are associated with an increased chance of having a stroke. You may prevent a stroke by making healthy choices and managing medical conditions. Reduce your risk of having a stroke by:  Staying physically active. Get at least 30 minutes of activity on most or all days.  Not smoking. It may also be helpful to avoid exposure to secondhand smoke.  Limiting alcohol use. Moderate alcohol use is considered to be:  No more than 2 drinks per day for men.  No more than 1 drink per day for nonpregnant women.  Eating healthy foods.  Include 5 or more servings of fruits and vegetables a day.  Certain diets may be prescribed to address high blood pressure, high cholesterol, diabetes, or obesity.  Managing your cholesterol levels.  A low-saturated fat, low-trans fat, low-cholesterol, and high-fiber diet may control cholesterol levels.  Take any prescribed medicines to control cholesterol as directed by your caregiver.  Managing your diabetes.  A controlled-carbohydrate, controlled-sugar diet is recommended to manage diabetes.  Take any prescribed medicines to control diabetes as directed by your caregiver.  Controlling your high blood pressure (hypertension).  A low-salt (sodium), low-saturated fat, low-trans fat, and low-cholesterol diet is recommended to manage high blood pressure.  Take any prescribed medicines to control hypertension as directed by your caregiver.  Maintaining a healthy weight.  A reduced-calorie, low-sodium, low-saturated fat, low-trans fat, low-cholesterol diet is recommended to manage weight.  Stopping drug abuse.  Avoiding birth control pills.  Talk to your caregiver about the risks of taking birth control pills if you are over 35 years old, smoke, get migraines, or have ever had a blood clot.  Getting evaluated for sleep disorders (sleep apnea).  Talk to your caregiver about getting a sleep evaluation  if you snore a lot or have excessive sleepiness.  Taking medicines as directed by your caregiver.  For some people, aspirin or blood thinners (anticoagulants) are helpful in reducing the risk of forming abnormal blood clots that can lead to stroke. If you have the irregular heart rhythm of atrial fibrillation, you should be on a blood thinner unless there is a good reason you cannot take them.  Understand all your medicine instructions. SEEK IMMEDIATE MEDICAL CARE IF:   You have sudden weakness or numbness of the face, arm, or leg, especially on one side of the body.  You have sudden confusion.  You have trouble speaking (aphasia) or understanding.  You have sudden trouble seeing in one or both eyes.  You have sudden trouble walking.  You have dizziness.  You have a loss of balance or coordination.  You have a sudden, severe headache with no known cause.  You have new chest pain or an irregular heartbeat. Any of these symptoms may represent a serious problem that is an emergency. Do not wait to see if the symptoms will go away. Get medical help right away. Call your local emergency services (911 in [place] Do not drive yourself to the hospital. Document Released: 02/18/2004 Document Revised: 04/04/2011 Document Reviewed: 07/13/2012 [facility] Patient Information 2014 [place], [place].   Obesity Obesity is defined as having too much total body fat and a body mass index (BMI) of 30 or more. BMI is an estimate of body fat and is calculated from your height and weight. Obesity happens when you consume more calories than you can burn by exercising or performing daily physical tasks. Prolonged obesity can cause major illnesses or emergencies, such as:   A  stroke.  Heart disease.  Diabetes.  Cancer.  Arthritis.  High blood pressure (hypertension).  High cholesterol.  Sleep apnea.  Erectile dysfunction.  Infertility problems. CAUSES   Regularly eating unhealthy  foods.  Physical inactivity.  Certain disorders, such as an underactive thyroid (hypothyroidism), Cushing's syndrome, and polycystic ovarian syndrome.  Certain medicines, such as steroids, some depression medicines, and antipsychotics.  Genetics.  Lack of sleep. DIAGNOSIS  A caregiver can diagnose obesity after calculating your BMI. Obesity will be diagnosed if your BMI is 30 or higher.  There are other methods of measuring obesity levels. Some other methods include measuring your skin fold thickness, your waist circumference, and comparing your hip circumference to your waist circumference. TREATMENT  A healthy treatment program includes some or all of the following:  Long-term dietary changes.  Exercise and physical activity.  Behavioral and lifestyle changes.  Medicine only under the supervision of your caregiver. Medicines may help, but only if they are used with diet and exercise programs. An unhealthy treatment program includes:  Fasting.  Fad diets.  Supplements and drugs. These choices do not succeed in long-term weight control.  HOME CARE INSTRUCTIONS   Exercise and perform physical activity as directed by your caregiver. To increase physical activity, try the following:  Use stairs instead of elevators.  Park farther away from store entrances.  Garden, bike, or walk instead of watching television or using the computer.  Eat healthy, low-calorie foods and drinks on a regular basis. Eat more fruits and vegetables. Use low-calorie cookbooks or take healthy cooking classes.  Limit fast food, sweets, and processed snack foods.  Eat smaller portions.  Keep a daily journal of everything you eat. There are many free websites to help you with this. It may be helpful to measure your foods so you can determine if you are eating the correct portion sizes.  Avoid drinking alcohol. Drink more water and drinks without calories.  Take vitamins and supplements only as  recommended by your caregiver.  Weight-loss support groups, Optometrist, counselors, and stress reduction education can also be very helpful. SEEK IMMEDIATE MEDICAL CARE IF:  You have chest pain or tightness.  You have trouble breathing or feel short of breath.  You have weakness or leg numbness.  You feel confused or have trouble talking.  You have sudden changes in your vision. MAKE SURE YOU:  Understand these instructions.  Will watch your condition.  Will get help right away if you are not doing well or get worse. Document Released: 02/18/2004 Document Revised: 07/12/2011 Document Reviewed: 02/16/2011 [facility] Patient Information 2014 [place], [place].

## 2013-02-20 ENCOUNTER — Ambulatory Visit (INDEPENDENT_AMBULATORY_CARE_PROVIDER_SITE_OTHER): Payer: Medicare Other | Admitting: Cardiology

## 2013-02-20 ENCOUNTER — Encounter: Payer: Self-pay | Admitting: Cardiology

## 2013-02-20 VITALS — BP 140/70 | HR 53 | Ht 72.0 in | Wt 267.8 lb

## 2013-02-20 DIAGNOSIS — I4891 Unspecified atrial fibrillation: Secondary | ICD-10-CM

## 2013-02-20 DIAGNOSIS — I10 Essential (primary) hypertension: Secondary | ICD-10-CM

## 2013-02-20 DIAGNOSIS — E785 Hyperlipidemia, unspecified: Secondary | ICD-10-CM

## 2013-02-20 DIAGNOSIS — I2510 Atherosclerotic heart disease of native coronary artery without angina pectoris: Secondary | ICD-10-CM

## 2013-02-20 LAB — CBC WITH DIFFERENTIAL/PLATELET
BASOS ABS: 0 10*3/uL (ref 0.0–0.1)
Basophils Relative: 0.4 % (ref 0.0–3.0)
EOS PCT: 1.7 % (ref 0.0–5.0)
Eosinophils Absolute: 0.1 10*3/uL (ref 0.0–0.7)
HCT: 41.3 % (ref 39.0–52.0)
Hemoglobin: 13.3 g/dL (ref 13.0–17.0)
Lymphocytes Relative: 29.7 % (ref 12.0–46.0)
Lymphs Abs: 1.9 10*3/uL (ref 0.7–4.0)
MCHC: 32.1 g/dL (ref 30.0–36.0)
MCV: 92.5 fl (ref 78.0–100.0)
MONO ABS: 0.6 10*3/uL (ref 0.1–1.0)
MONOS PCT: 9.2 % (ref 3.0–12.0)
NEUTROS PCT: 59 % (ref 43.0–77.0)
Neutro Abs: 3.8 10*3/uL (ref 1.4–7.7)
PLATELETS: 179 10*3/uL (ref 150.0–400.0)
RBC: 4.46 Mil/uL (ref 4.22–5.81)
RDW: 14.2 % (ref 11.5–14.6)
WBC: 6.4 10*3/uL (ref 4.5–10.5)

## 2013-02-20 LAB — HEPATIC FUNCTION PANEL
ALBUMIN: 4 g/dL (ref 3.5–5.2)
ALT: 21 U/L (ref 0–53)
AST: 25 U/L (ref 0–37)
Alkaline Phosphatase: 64 U/L (ref 39–117)
Bilirubin, Direct: 0 mg/dL (ref 0.0–0.3)
Total Bilirubin: 0.6 mg/dL (ref 0.3–1.2)
Total Protein: 6.9 g/dL (ref 6.0–8.3)

## 2013-02-20 LAB — BASIC METABOLIC PANEL
BUN: 17 mg/dL (ref 6–23)
CALCIUM: 9.2 mg/dL (ref 8.4–10.5)
CO2: 27 mEq/L (ref 19–32)
Chloride: 102 mEq/L (ref 96–112)
Creatinine, Ser: 1.2 mg/dL (ref 0.4–1.5)
GFR: 65.19 mL/min (ref >60.00)
Glucose, Bld: 116 mg/dL — ABNORMAL HIGH (ref 70–99)
Potassium: 4.2 mEq/L (ref 3.5–5.1)
SODIUM: 136 meq/L (ref 135–145)

## 2013-02-20 LAB — LIPID PANEL
Cholesterol: 176 mg/dL (ref 0–200)
HDL: 38.6 mg/dL — AB (ref >39.00)
LDL Cholesterol: 116 mg/dL — ABNORMAL HIGH (ref 0–99)
TRIGLYCERIDES: 105 mg/dL (ref 0.0–149.0)
Total CHOL/HDL Ratio: 5
VLDL: 21 mg/dL (ref 0.0–40.0)

## 2013-02-20 MED ORDER — RIVAROXABAN 20 MG PO TABS: 20.0000 mg | ORAL_TABLET | Freq: Every day | ORAL | Status: DC

## 2013-02-20 MED ORDER — METOPROLOL SUCCINATE ER 25 MG PO TB24: 25.0000 mg | ORAL_TABLET | Freq: Every day | ORAL | Status: DC

## 2013-02-20 NOTE — Patient Instructions (Signed)
Stop ASA  Start Xarelto 20 mg daily  Reduce Toprol XL to 25 mg daily  We will check blood work today and schedule you for an Echocardiogram  I will see you in one month.

## 2013-02-20 NOTE — Progress Notes (Signed)
Evan [name] Date of Birth: 1940-10-01   History of Present Illness: Evan [name] is seen today for followup. He has a history of coronary disease and is status post coronary bypass surgery in 2005. He had a cardiac catheterization 2007 which demonstrated his grafts were patent. He had a nuclear stress test in January of 2013 which demonstrated no significant perfusion abnormalities. His ejection fraction was estimated 46% but visually it appeared normal. He underwent a left carotid endarterectomy in May 2014. He is intolerant to statins due to severe myalgias and weakness. Also intolerant of niacin. Since stopping statin has noted a marked improvement in his strength and ability to walk. Does note occ. Palpitations mostly at night.  Current Outpatient Prescriptions on File Prior to Visit  Medication Sig Dispense Refill  . aspirin EC 81 MG tablet Take 81 mg by mouth every morning.      . hydrochlorothiazide (HYDRODIURIL) 25 MG tablet Take 25 mg by mouth every morning.       Marland Kitchen ibuprofen (ADVIL,MOTRIN) 200 MG tablet Take 200-400 mg by mouth every 8 (eight) hours as needed for pain.       . metFORMIN (GLUCOPHAGE) 500 MG tablet Take 500 mg by mouth daily with breakfast.       . OVER THE COUNTER MEDICATION Take 3 tablets by mouth daily with supper. Replenex for joint strength      . quinapril (ACCUPRIL) 40 MG tablet Take 40 mg by mouth daily.        No current facility-administered medications on file prior to visit.    Allergies  Allergen Reactions  . Niacin And Related Rash    Red face and burns.    Past Medical History  Diagnosis Date  . Coronary artery disease   . Hyperlipidemia   . Hypertension   . Obesity   . Carotid arterial disease   . Diabetes mellitus   . Torn rotator cuff 2012     Right arm  . Osteoarthritis     Left wrist  . Heart murmur   . Dyspnea     with exertion  . HOH (hard of hearing)   . Frequency of urination   . Bruises easily     Past Surgical History   Procedure Laterality Date  . Mole surgery    . Coronary artery bypass graft  03/2003    X3. LIMA GRAFT TO THE LAD, SAPHENOUS VEIN GRAFT TO THE PA, AND A LEFT RADIAL GRAFT TO THEOBTUSE MARGINAL VESSEL  . Cardiac catheterization  11/29/2005    SEVERE THREE VESSEL OBSTRUCTIVE ATHERSCLEROTIC CAD. ALL GRAFTS WERE PATENT. EF 65%  . Back surgery      X2  . Endarterectomy Left 05/28/2012    Procedure: ENDARTERECTOMY CAROTID;  Surgeon: Elam Dutch, MD;  Location: [place];  Service: Vascular;  Laterality: Left;  . Patch angioplasty Left 05/28/2012    Procedure: WITH dACRON PATCH ANGIOPLASTY;  Surgeon: Elam Dutch, MD;  Location: [facility] OR;  Service: Vascular;  Laterality: Left;  . Carotid endarterectomy Left 05-28-12    cea  . Carotid endarterectomy Right 01-29-08    cea    History  Smoking status  . Former Smoker  . Quit date: 01/24/1994  Smokeless tobacco  . Former User    History  Alcohol Use No    Family History  Problem Relation Age of Onset  . Heart attack Father   . Heart attack Brother   . Parkinsonism Brother   . Cancer Brother   .  Arrhythmia Brother     Review of Systems:  He complains of mid umbilical pain occ with exertion. No chest pain. No pain after eating. All other systems were reviewed and are negative.  Physical Exam: BP 140/70  Pulse 53  Ht 6' (1.829 m)  Wt 267 lb 12.8 oz (121.473 kg)  BMI 36.31 kg/m2 He is an obese white male in no acute distress. He is normocephalic, atraumatic. Pupils equal round react to light accommodation. Extraocular movements are full. Oropharynx is clear. Neck is without JVD, adenopathy, thyromegaly, or bruits. Lungs are clear. Cardiac exam reveals an IRR and grade 2/6 systolic ejection murmur the right upper sternal border radiating to the left sternal border. There is no diastolic murmur or S3. Abdomen is obese, soft, nontender. He has no masses or bruits. He has no edema. Femoral and pedal pulses are good.  LABORATORY DATA: Ecg  today shows Atrial fibrillation with rate 53 bpm. RBBB. Afib is new compared to 1/14.   Assessment / Plan: 1. Coronary disease status post CABG in 2005. He has stable class I anginal symptoms. His Myoview study 2013 showed no significant ischemia. We'll continue with his medical therapy including aspirin, metoprolol, and ACE inhibitor. Intolerant to statins.  2. Hyperlipidemia, off all therapy now. Will check fasting lab work today. May want to consider Zetia.  3. Diabetes mellitus type 2. He will continue metformin.  4. Hypertension well controlled today.  5. History of Trifascicular block. Patient is asymptomatic.   6. Carotid arterial disease status post left endarterectomy.  7. Atrial fibrillation- new onset. He has a high [place] score. Will start anticoagulation with Xarelto 20 mg daily. Stop ASA. Avoid NSAIDs. Will update Echo. Given slow rate will reduce metoprolol to 25 mg daily. Will follow up in one month and decide whether to consider DCCV.

## 2013-02-21 ENCOUNTER — Telehealth: Payer: Self-pay | Admitting: Cardiology

## 2013-02-21 NOTE — Telephone Encounter (Signed)
New problem   Pt's wife want to know if pt can take cold medicine and if so what kind. Please call pt's wife

## 2013-02-21 NOTE — Telephone Encounter (Signed)
Returned call to patient's wife she stated patient woke up with a runny nose wanting to know what he could take.Advised ok to take plain zyrtec or any plain antihistamine,no decongestant.Advised to see PCP if needed.

## 2013-02-25 ENCOUNTER — Other Ambulatory Visit: Payer: Self-pay

## 2013-02-25 ENCOUNTER — Telehealth: Payer: Self-pay | Admitting: Cardiology

## 2013-02-25 MED ORDER — EZETIMIBE 10 MG PO TABS: 10.0000 mg | ORAL_TABLET | Freq: Every day | ORAL | Status: DC

## 2013-02-25 NOTE — Telephone Encounter (Signed)
Returned call to patient's wife she stated husband has had 3 nose bleeds this morning and 1 nose bleed yesterday.Stated he has a cold.Stated he started on Xarelto 02/20/13 and is afraid to take anymore.Spoke to [place] he advised nose bleeds could be coming from his cold and not to blow his nose hard.Advised may hold Xarelto 2 to 3 days and then restart.Advised to call if continues to have nose bleeds.

## 2013-02-25 NOTE — Telephone Encounter (Signed)
Patient's wife is calling back she is concerned about husbands nose bleed. Please call and advise.

## 2013-02-25 NOTE — Telephone Encounter (Signed)
New Problem:  Pt's wife states her husband has had a nose bleed this morning and she would like to speak to a nurse about it. Pt is on xarelto. Mrs. Hornig would also like to speak to the nurse about lab results.

## 2013-02-27 ENCOUNTER — Ambulatory Visit (HOSPITAL_COMMUNITY): Payer: Medicare Other | Attending: Cardiology | Admitting: Radiology

## 2013-02-27 ENCOUNTER — Encounter: Payer: Self-pay | Admitting: Cardiology

## 2013-02-27 DIAGNOSIS — I2510 Atherosclerotic heart disease of native coronary artery without angina pectoris: Secondary | ICD-10-CM | POA: Insufficient documentation

## 2013-02-27 DIAGNOSIS — I10 Essential (primary) hypertension: Secondary | ICD-10-CM | POA: Insufficient documentation

## 2013-02-27 DIAGNOSIS — E785 Hyperlipidemia, unspecified: Secondary | ICD-10-CM | POA: Insufficient documentation

## 2013-02-27 DIAGNOSIS — I359 Nonrheumatic aortic valve disorder, unspecified: Secondary | ICD-10-CM | POA: Insufficient documentation

## 2013-02-27 DIAGNOSIS — I059 Rheumatic mitral valve disease, unspecified: Secondary | ICD-10-CM | POA: Insufficient documentation

## 2013-02-27 DIAGNOSIS — I4891 Unspecified atrial fibrillation: Secondary | ICD-10-CM | POA: Insufficient documentation

## 2013-02-27 DIAGNOSIS — Z951 Presence of aortocoronary bypass graft: Secondary | ICD-10-CM | POA: Insufficient documentation

## 2013-02-27 NOTE — Progress Notes (Signed)
Echocardiogram performed.  

## 2013-03-20 ENCOUNTER — Encounter: Payer: Self-pay | Admitting: Cardiology

## 2013-03-20 ENCOUNTER — Ambulatory Visit (INDEPENDENT_AMBULATORY_CARE_PROVIDER_SITE_OTHER): Payer: Medicare Other | Admitting: Cardiology

## 2013-03-20 VITALS — BP 158/74 | HR 56 | Ht 72.0 in | Wt 260.0 lb

## 2013-03-20 DIAGNOSIS — E785 Hyperlipidemia, unspecified: Secondary | ICD-10-CM

## 2013-03-20 DIAGNOSIS — I2510 Atherosclerotic heart disease of native coronary artery without angina pectoris: Secondary | ICD-10-CM

## 2013-03-20 DIAGNOSIS — I10 Essential (primary) hypertension: Secondary | ICD-10-CM

## 2013-03-20 DIAGNOSIS — I4891 Unspecified atrial fibrillation: Secondary | ICD-10-CM

## 2013-03-20 NOTE — Patient Instructions (Signed)
Stop taking Toprol ( metoprolol )  Continue your other therapy  I will see you in 3 months.

## 2013-03-20 NOTE — Progress Notes (Signed)
Evan [name] Date of Birth: 07/23/1940   History of Present Illness: Evan [name] is seen today for followup of newly diagnosed atrial fibrillation. He has a history of coronary disease and is status post coronary bypass surgery in 2005. He had a cardiac catheterization 2007 which demonstrated his grafts were patent. He had a nuclear stress test in January of 2013 which demonstrated no significant perfusion abnormalities. His ejection fraction was estimated 46% but visually it appeared normal. He underwent a left carotid endarterectomy in May 2014. He is intolerant to statins due to severe myalgias and weakness.  Since stopping statin has noted a marked improvement in his strength and ability to walk. Does note occ.On his visit in January he was noted to be in Atrial fibrillation. He is minimally symptomatic. Due to his slow HR metoprolol dose was reduced He was started on Xarelto for anticoagulation. He did develop a nosebleed after a cold and held Xarelto for 2 days but then resumed. He is feeling well today.  Current Outpatient Prescriptions on File Prior to Visit  Medication Sig Dispense Refill  . ezetimibe (ZETIA) 10 MG tablet Take 1 tablet (10 mg total) by mouth daily.  30 tablet  6  . hydrochlorothiazide (HYDRODIURIL) 25 MG tablet Take 25 mg by mouth every morning.       . metFORMIN (GLUCOPHAGE) 500 MG tablet Take 500 mg by mouth daily with breakfast.       . OVER THE COUNTER MEDICATION Take 3 tablets by mouth daily with supper. Replenex for joint strength      . quinapril (ACCUPRIL) 40 MG tablet Take 40 mg by mouth daily.       . Rivaroxaban (XARELTO) 20 MG TABS tablet Take 1 tablet (20 mg total) by mouth daily with supper.  30 tablet  6   No current facility-administered medications on file prior to visit.    Allergies  Allergen Reactions  . Niacin And Related Rash    Red face and burns.    Past Medical History  Diagnosis Date  . Coronary artery disease   . Hyperlipidemia   .  Hypertension   . Obesity   . Carotid arterial disease   . Diabetes mellitus   . Torn rotator cuff 2012     Right arm  . Osteoarthritis     Left wrist  . Heart murmur   . Dyspnea     with exertion  . HOH (hard of hearing)   . Frequency of urination   . Bruises easily     Past Surgical History  Procedure Laterality Date  . Mole surgery    . Coronary artery bypass graft  03/2003    X3. LIMA GRAFT TO THE LAD, SAPHENOUS VEIN GRAFT TO THE PA, AND A LEFT RADIAL GRAFT TO THEOBTUSE MARGINAL VESSEL  . Cardiac catheterization  11/29/2005    SEVERE THREE VESSEL OBSTRUCTIVE ATHERSCLEROTIC CAD. ALL GRAFTS WERE PATENT. EF 65%  . Back surgery      X2  . Endarterectomy Left 05/28/2012    Procedure: ENDARTERECTOMY CAROTID;  Surgeon: Elam Dutch, MD;  Location: [place];  Service: Vascular;  Laterality: Left;  . Patch angioplasty Left 05/28/2012    Procedure: WITH dACRON PATCH ANGIOPLASTY;  Surgeon: Elam Dutch, MD;  Location: [facility] OR;  Service: Vascular;  Laterality: Left;  . Carotid endarterectomy Left 05-28-12    cea  . Carotid endarterectomy Right 01-29-08    cea    History  Smoking status  .  Former Smoker  . Quit date: 01/24/1994  Smokeless tobacco  . Former User    History  Alcohol Use No    Family History  Problem Relation Age of Onset  . Heart attack Father   . Heart attack Brother   . Parkinsonism Brother   . Cancer Brother   . Arrhythmia Brother     Review of Systems:  He complains of mid umbilical pain occ with exertion. No chest pain. No pain after eating. All other systems were reviewed and are negative.  Physical Exam: BP 158/74  Pulse 56  Ht 6' (1.829 m)  Wt 260 lb (117.935 kg)  BMI 35.25 kg/m2 He is an obese white male in no acute distress. He is normocephalic, atraumatic. Pupils equal round react to light accommodation. Extraocular movements are full. Oropharynx is clear. Neck is without JVD, adenopathy, thyromegaly, or bruits. Lungs are clear. Cardiac exam  reveals an IRR and grade 2/6 systolic ejection murmur the right upper sternal border radiating to the left sternal border. There is no diastolic murmur or S3. Abdomen is obese, soft, nontender. He has no masses or bruits. He has no edema. Femoral and pedal pulses are good.  LABORATORY DATA: Echo:Study Conclusions  - Left ventricle: Systolic function was normal. The estimated ejection fraction was in the range of 50% to 55%. Regional wall motion abnormalities cannot be excluded. - Aortic valve: Cusp separation was reduced. There was moderate stenosis. Mean gradient: 85mm Hg (S). - Mitral valve: Moderately calcified annulus. - Left atrium: The atrium was moderately dilated.     Assessment / Plan: 1. Coronary disease status post CABG in 2005. He has stable class I anginal symptoms. His Myoview study 2013 showed no significant ischemia. We'll continue with his medical therapy including aspirin, metoprolol, and ACE inhibitor. Intolerant to statins.  2. Hyperlipidemia, intolerant to statins. Now on Zetia.  3. Diabetes mellitus type 2. He will continue metformin.  4. Hypertension well controlled today.  5. History of Trifascicular block. Patient is asymptomatic.   6. Carotid arterial disease status post left endarterectomy.  7. Atrial fibrillation- new onset. He has a high [place] score. Continue Xarelto 20 mg daily. Avoid NSAIDs. Will update Echo. With continued slow rate will stop metoprolol completely. Since he has no significant symptoms will treat with rate control and anticoagulation. With LA enlargement I don't think he would be able to maintain NSR without antiarrhythmic drug therapy and even so would have an increase risk of recurrence.

## 2013-03-25 ENCOUNTER — Telehealth: Payer: Self-pay | Admitting: Cardiology

## 2013-03-25 ENCOUNTER — Telehealth: Payer: Self-pay | Admitting: *Deleted

## 2013-03-25 NOTE — Telephone Encounter (Signed)
New message    Cannot get xarelto medication.  [facility] needs an authorization from the ins company.  He has a few pills left

## 2013-03-25 NOTE — Telephone Encounter (Signed)
[employer] approves xarelto 20 mg tablet  for 1 year from today, PA # [id number] ID # [id number] [facility] in [place] notified, they are the ones that initiated PA

## 2013-06-14 ENCOUNTER — Encounter (INDEPENDENT_AMBULATORY_CARE_PROVIDER_SITE_OTHER): Payer: Self-pay

## 2013-06-14 ENCOUNTER — Encounter: Payer: Self-pay | Admitting: Cardiology

## 2013-06-14 ENCOUNTER — Ambulatory Visit (INDEPENDENT_AMBULATORY_CARE_PROVIDER_SITE_OTHER): Payer: Medicare Other | Admitting: Cardiology

## 2013-06-14 VITALS — BP 154/56 | HR 52 | Ht 72.0 in | Wt 260.4 lb

## 2013-06-14 DIAGNOSIS — I4891 Unspecified atrial fibrillation: Secondary | ICD-10-CM

## 2013-06-14 DIAGNOSIS — I2510 Atherosclerotic heart disease of native coronary artery without angina pectoris: Secondary | ICD-10-CM

## 2013-06-14 DIAGNOSIS — I10 Essential (primary) hypertension: Secondary | ICD-10-CM

## 2013-06-14 DIAGNOSIS — E785 Hyperlipidemia, unspecified: Secondary | ICD-10-CM

## 2013-06-14 DIAGNOSIS — I452 Bifascicular block: Secondary | ICD-10-CM

## 2013-06-14 NOTE — Patient Instructions (Signed)
Continue your current therapy  I will see you in 6 months.   

## 2013-06-14 NOTE — Progress Notes (Signed)
Evan [name] Date of Birth: Jun 10, 1940   History of Present Illness: Evan [name] is seen today for followup of newly diagnosed atrial fibrillation. He has a history of coronary disease and is status post coronary bypass surgery in 2005. He had a cardiac catheterization 2007 which demonstrated his grafts were patent. He had a nuclear stress test in January of 2013 which demonstrated no significant perfusion abnormalities. His ejection fraction was estimated 46% but visually it appeared normal. He underwent a left carotid endarterectomy in May 2014. He is intolerant to statins due to severe myalgias and weakness.  He has permanent Atrial fibrillation. He is minimally symptomatic. Due to his slow HR metoprolol dose was stopped. He is on Xarelto for anticoagulation. He reports he is doing well. No chest pain. He does get SOB when he walks too fast or goes up hills. 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 He was found to be B12 deficient and since this was repleted his energy level has improved.  Current Outpatient Prescriptions on File Prior to Visit  Medication Sig Dispense Refill  . ezetimibe (ZETIA) 10 MG tablet Take 1 tablet (10 mg total) by mouth daily.  30 tablet  6  . hydrochlorothiazide (HYDRODIURIL) 25 MG tablet Take 25 mg by mouth every morning.       . metFORMIN (GLUCOPHAGE) 500 MG tablet Take 500 mg by mouth daily with breakfast.       . OVER THE COUNTER MEDICATION Take 3 tablets by mouth daily with supper. Replenex for joint strength      . quinapril (ACCUPRIL) 40 MG tablet Take 40 mg by mouth daily.       . Rivaroxaban (XARELTO) 20 MG TABS tablet Take 1 tablet (20 mg total) by mouth daily with supper.  30 tablet  6   No current facility-administered medications on file prior to visit.    Allergies  Allergen Reactions  . Niacin And Related Rash    Red face and burns.    Past Medical History  Diagnosis Date  . Coronary artery disease   . Hyperlipidemia    . Hypertension   . Obesity   . Carotid arterial disease   . Diabetes mellitus   . Torn rotator cuff 2012     Right arm  . Osteoarthritis     Left wrist  . Heart murmur   . Dyspnea     with exertion  . HOH (hard of hearing)   . Frequency of urination   . Bruises easily     Past Surgical History  Procedure Laterality Date  . Mole surgery    . Coronary artery bypass graft  03/2003    X3. LIMA GRAFT TO THE LAD, SAPHENOUS VEIN GRAFT TO THE PA, AND A LEFT RADIAL GRAFT TO THEOBTUSE MARGINAL VESSEL  . Cardiac catheterization  11/29/2005    SEVERE THREE VESSEL OBSTRUCTIVE ATHERSCLEROTIC CAD. ALL GRAFTS WERE PATENT. EF 65%  . Back surgery      X2  . Endarterectomy Left 05/28/2012    Procedure: ENDARTERECTOMY CAROTID;  Surgeon: Elam Dutch, MD;  Location: [place];  Service: Vascular;  Laterality: Left;  . Patch angioplasty Left 05/28/2012    Procedure: WITH dACRON PATCH ANGIOPLASTY;  Surgeon: Elam Dutch, MD;  Location: [facility] OR;  Service: Vascular;  Laterality: Left;  . Carotid endarterectomy Left 05-28-12    cea  . Carotid endarterectomy Right 01-29-08    cea    History  Smoking status  . Former Smoker  . Quit date: 01/24/1994  Smokeless tobacco  . Former User    History  Alcohol Use No    Family History  Problem Relation Age of Onset  . Heart attack Father   . Heart attack Brother   . Parkinsonism Brother   . Cancer Brother   . Arrhythmia Brother     Review of Systems: As noted in HPI. All other systems were reviewed and are negative.  Physical Exam: BP 154/56  Pulse 52  Ht 6' (1.829 m)  Wt 260 lb 6.4 oz (118.117 kg)  BMI 35.31 kg/m2 He is an obese white male in no acute distress. He is normocephalic, atraumatic. Pupils equal round react to light accommodation. Extraocular movements are full. Oropharynx is clear. Neck is without JVD, adenopathy, thyromegaly, or bruits. Lungs are clear. Cardiac exam reveals an IRR and grade 2/6 systolic ejection murmur the right  upper sternal border radiating to the left sternal border. There is no diastolic murmur or S3. Abdomen is obese, soft, nontender. He has no masses or bruits. He has no edema. Femoral and pedal pulses are good.  LABORATORY DATA: Ecg: 05/08/13; AFib, RBBB, LAFB. Rate 60 bpm.  Assessment / Plan: 1. Coronary disease status post CABG in 2005. He has stable class I anginal symptoms. His Myoview study 2013 showed no significant ischemia. We'll continue with his medical therapy including aspirin, metoprolol, and ACE inhibitor. Intolerant to statins.  2. Hyperlipidemia, intolerant to statins. Now on Zetia.  3. Diabetes mellitus type 2. He will continue metformin.  4. Hypertension well controlled today.  5. History of Trifascicular block. Patient is asymptomatic.   6. Carotid arterial disease status post left endarterectomy.  7. Atrial fibrillation- now permanent. He has a high [place] score. Continue Xarelto 20 mg daily. Avoid NSAIDs.  On no rate slowing medication due to bradycardia. Mild dyspnea c/w chronotropic incompetence. Will observe for now. If symptoms worsen he may need a pacemaker.

## 2013-09-12 ENCOUNTER — Other Ambulatory Visit: Payer: Self-pay | Admitting: *Deleted

## 2013-09-12 MED ORDER — EZETIMIBE 10 MG PO TABS: 10.0000 mg | ORAL_TABLET | Freq: Every day | ORAL | Status: DC

## 2013-10-17 ENCOUNTER — Telehealth: Payer: Self-pay | Admitting: Cardiology

## 2013-10-17 NOTE — Telephone Encounter (Signed)
Closed encounter °

## 2013-10-22 ENCOUNTER — Encounter: Payer: Self-pay | Admitting: Cardiology

## 2013-10-22 ENCOUNTER — Ambulatory Visit (INDEPENDENT_AMBULATORY_CARE_PROVIDER_SITE_OTHER): Payer: Medicare Other | Admitting: Cardiology

## 2013-10-22 VITALS — BP 144/74 | HR 54 | Ht 72.0 in | Wt 267.0 lb

## 2013-10-22 DIAGNOSIS — E785 Hyperlipidemia, unspecified: Secondary | ICD-10-CM

## 2013-10-22 DIAGNOSIS — I25119 Atherosclerotic heart disease of native coronary artery with unspecified angina pectoris: Secondary | ICD-10-CM

## 2013-10-22 DIAGNOSIS — I452 Bifascicular block: Secondary | ICD-10-CM

## 2013-10-22 DIAGNOSIS — I10 Essential (primary) hypertension: Secondary | ICD-10-CM

## 2013-10-22 DIAGNOSIS — I482 Chronic atrial fibrillation, unspecified: Secondary | ICD-10-CM

## 2013-10-22 DIAGNOSIS — I2510 Atherosclerotic heart disease of native coronary artery without angina pectoris: Secondary | ICD-10-CM

## 2013-10-22 DIAGNOSIS — I209 Angina pectoris, unspecified: Secondary | ICD-10-CM

## 2013-10-22 DIAGNOSIS — I4891 Unspecified atrial fibrillation: Secondary | ICD-10-CM

## 2013-10-22 NOTE — Patient Instructions (Signed)
Continue your current therapy  I will see you in 6 months.   

## 2013-10-24 NOTE — Progress Notes (Signed)
Evan [name] Date of Birth: 1940-12-10   History of Present Illness: Mr. Rindfleisch is seen today for followup. He has a history of coronary disease and is status post coronary bypass surgery in 2005. He had a cardiac catheterization 2007 which demonstrated his grafts were patent. He had a nuclear stress test in January of 2013 which demonstrated no significant perfusion abnormalities. His ejection fraction was estimated 46% but visually it appeared normal. He underwent a left carotid endarterectomy in May 2014. He is intolerant to statins due to severe myalgias and weakness.  He has permanent Atrial fibrillation. He is minimally symptomatic. Due to his slow HR metoprolol dose was stopped. He is on Xarelto for anticoagulation. He reports he is doing well. No chest pain. He does get SOB if he goes too fast. This has not changed.  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 He does report some episodes of hypoglycemia.   Current Outpatient Prescriptions on File Prior to Visit  Medication Sig Dispense Refill  . acetaminophen (TYLENOL) 500 MG tablet Take 500 mg by mouth every 6 (six) hours as needed.      . ezetimibe (ZETIA) 10 MG tablet Take 1 tablet (10 mg total) by mouth daily.  30 tablet  2  . hydrochlorothiazide (HYDRODIURIL) 25 MG tablet Take 25 mg by mouth every morning.       . metFORMIN (GLUCOPHAGE) 500 MG tablet Take 500 mg by mouth daily with breakfast.       . OVER THE COUNTER MEDICATION Take 3 tablets by mouth daily with supper. Replenex for joint strength      . quinapril (ACCUPRIL) 40 MG tablet Take 40 mg by mouth daily.       . Rivaroxaban (XARELTO) 20 MG TABS tablet Take 1 tablet (20 mg total) by mouth daily with supper.  30 tablet  6   No current facility-administered medications on file prior to visit.    Allergies  Allergen Reactions  . Niacin And Related Rash    Red face and burns.    Past Medical History  Diagnosis Date  . Coronary artery disease    . Hyperlipidemia   . Hypertension   . Obesity   . Carotid arterial disease   . Diabetes mellitus   . Torn rotator cuff 2012     Right arm  . Osteoarthritis     Left wrist  . Heart murmur   . Dyspnea     with exertion  . HOH (hard of hearing)   . Frequency of urination   . Bruises easily     Past Surgical History  Procedure Laterality Date  . Mole surgery    . Coronary artery bypass graft  03/2003    X3. LIMA GRAFT TO THE LAD, SAPHENOUS VEIN GRAFT TO THE PA, AND A LEFT RADIAL GRAFT TO THEOBTUSE MARGINAL VESSEL  . Cardiac catheterization  11/29/2005    SEVERE THREE VESSEL OBSTRUCTIVE ATHERSCLEROTIC CAD. ALL GRAFTS WERE PATENT. EF 65%  . Back surgery      X2  . Endarterectomy Left 05/28/2012    Procedure: ENDARTERECTOMY CAROTID;  Surgeon: Elam Dutch, MD;  Location: [place];  Service: Vascular;  Laterality: Left;  . Patch angioplasty Left 05/28/2012    Procedure: WITH dACRON PATCH ANGIOPLASTY;  Surgeon: Elam Dutch, MD;  Location: [facility] OR;  Service: Vascular;  Laterality: Left;  . Carotid endarterectomy Left 05-28-12    cea  . Carotid endarterectomy Right  01-29-08    cea    History  Smoking status  . Former Smoker  . Quit date: 01/24/1994  Smokeless tobacco  . Former User    History  Alcohol Use No    Family History  Problem Relation Age of Onset  . Heart attack Father   . Heart attack Brother   . Parkinsonism Brother   . Cancer Brother   . Arrhythmia Brother     Review of Systems: As noted in HPI. All other systems were reviewed and are negative.  Physical Exam: BP 144/74  Pulse 54  Ht 6' (1.829 m)  Wt 267 lb (121.11 kg)  BMI 36.20 kg/m2 He is an obese white male in no acute distress. He is normocephalic, atraumatic. Pupils equal round react to light accommodation. Extraocular movements are full. Oropharynx is clear. Neck is without JVD, adenopathy, thyromegaly, or bruits. Lungs are clear. Cardiac exam reveals an IRR and grade 2/6 systolic ejection  murmur the right upper sternal border radiating to the left sternal border. There is no diastolic murmur or S3. Abdomen is obese, soft, nontender. He has no masses or bruits. He has no edema. Femoral and pedal pulses are good.  LABORATORY DATA:   Assessment / Plan: 1. Coronary disease status post CABG in 2005. He has stable class I anginal symptoms. His Myoview study 2013 showed no significant ischemia. We'll continue with his medical therapy including aspirin, metoprolol, and ACE inhibitor. Intolerant to statins.  2. Hyperlipidemia, intolerant to statins. Now on Zetia.  3. Diabetes mellitus type 2. He will continue metformin. Discuss hypoglycemia with his primary care.  4. Hypertension fair control today.  5. History of Trifascicular block. Patient is asymptomatic. On no rate slowing meds.  6. Carotid arterial disease status post left endarterectomy.  7. Atrial fibrillation- now permanent.  Continue Xarelto 20 mg daily. Avoid NSAIDs.  On no rate slowing medication due to bradycardia. Mild dyspnea c/w chronotropic incompetence. Will observe for now.

## 2013-12-20 ENCOUNTER — Telehealth: Payer: Self-pay | Admitting: Cardiology

## 2013-12-20 NOTE — Telephone Encounter (Deleted)
Error

## 2013-12-20 NOTE — Telephone Encounter (Signed)
Calling for Xarelto Samples. Given 6 bottles, Xarelto 20mg , once daily by mouth

## 2014-01-10 ENCOUNTER — Encounter: Payer: Self-pay | Admitting: Family

## 2014-01-10 ENCOUNTER — Other Ambulatory Visit: Payer: Self-pay

## 2014-01-10 MED ORDER — EZETIMIBE 10 MG PO TABS: 10.0000 mg | ORAL_TABLET | Freq: Every day | ORAL | Status: DC

## 2014-01-13 ENCOUNTER — Ambulatory Visit (INDEPENDENT_AMBULATORY_CARE_PROVIDER_SITE_OTHER): Payer: Medicare Other | Admitting: Family

## 2014-01-13 ENCOUNTER — Ambulatory Visit (HOSPITAL_COMMUNITY)
Admission: RE | Admit: 2014-01-13 | Discharge: 2014-01-13 | Disposition: A | Discharge status: Home / Self Care | Payer: Medicare Other | Source: Ambulatory Visit | Attending: Family | Admitting: Family

## 2014-01-13 ENCOUNTER — Encounter: Payer: Self-pay | Admitting: Family

## 2014-01-13 VITALS — BP 117/70 | HR 61 | Resp 16 | Ht 72.0 in | Wt 263.0 lb

## 2014-01-13 DIAGNOSIS — Z9889 Other specified postprocedural states: Secondary | ICD-10-CM

## 2014-01-13 DIAGNOSIS — I6523 Occlusion and stenosis of bilateral carotid arteries: Secondary | ICD-10-CM | POA: Diagnosis present

## 2014-01-13 DIAGNOSIS — Z48812 Encounter for surgical aftercare following surgery on the circulatory system: Secondary | ICD-10-CM

## 2014-01-13 DIAGNOSIS — I2510 Atherosclerotic heart disease of native coronary artery without angina pectoris: Secondary | ICD-10-CM

## 2014-01-13 NOTE — Patient Instructions (Signed)
Stroke Prevention Some medical conditions and behaviors are associated with an increased chance of having a stroke. You may prevent a stroke by making healthy choices and managing medical conditions. HOW CAN I REDUCE MY RISK OF HAVING A STROKE?   Stay physically active. Get at least 30 minutes of activity on most or all days.  Do not smoke. It may also be helpful to avoid exposure to secondhand smoke.  Limit alcohol use. Moderate alcohol use is considered to be:  No more than 2 drinks per day for men.  No more than 1 drink per day for nonpregnant women.  Eat healthy foods. This involves:  Eating 5 or more servings of fruits and vegetables a day.  Making dietary changes that address high blood pressure (hypertension), high cholesterol, diabetes, or obesity.  Manage your cholesterol levels.  Making food choices that are high in fiber and low in saturated fat, trans fat, and cholesterol may control cholesterol levels.  Take any prescribed medicines to control cholesterol as directed by your health care provider.  Manage your diabetes.  Controlling your carbohydrate and sugar intake is recommended to manage diabetes.  Take any prescribed medicines to control diabetes as directed by your health care provider.  Control your hypertension.  Making food choices that are low in salt (sodium), saturated fat, trans fat, and cholesterol is recommended to manage hypertension.  Take any prescribed medicines to control hypertension as directed by your health care provider.  Maintain a healthy weight.  Reducing calorie intake and making food choices that are low in sodium, saturated fat, trans fat, and cholesterol are recommended to manage weight.  Stop drug abuse.  Avoid taking birth control pills.  Talk to your health care provider about the risks of taking birth control pills if you are over 35 years old, smoke, get migraines, or have ever had a blood clot.  Get evaluated for sleep  disorders (sleep apnea).  Talk to your health care provider about getting a sleep evaluation if you snore a lot or have excessive sleepiness.  Take medicines only as directed by your health care provider.  For some people, aspirin or blood thinners (anticoagulants) are helpful in reducing the risk of forming abnormal blood clots that can lead to stroke. If you have the irregular heart rhythm of atrial fibrillation, you should be on a blood thinner unless there is a good reason you cannot take them.  Understand all your medicine instructions.  Make sure that other conditions (such as anemia or atherosclerosis) are addressed. SEEK IMMEDIATE MEDICAL CARE IF:   You have sudden weakness or numbness of the face, arm, or leg, especially on one side of the body.  Your face or eyelid droops to one side.  You have sudden confusion.  You have trouble speaking (aphasia) or understanding.  You have sudden trouble seeing in one or both eyes.  You have sudden trouble walking.  You have dizziness.  You have a loss of balance or coordination.  You have a sudden, severe headache with no known cause.  You have new chest pain or an irregular heartbeat. Any of these symptoms may represent a serious problem that is an emergency. Do not wait to see if the symptoms will go away. Get medical help at once. Call your local emergency services (911 in [place] Do not drive yourself to the hospital. Document Released: 02/18/2004 Document Revised: 05/27/2013 Document Reviewed: 07/13/2012 [employer] Patient Information 2015 [employer]. This information is not intended to replace advice given   to you by your health care provider. Make sure you discuss any questions you have with your health care provider.  

## 2014-01-13 NOTE — Progress Notes (Signed)
Established Carotid Patient   History of Present Illness  Evan [name] is a 73 y.o. male patient of Dr. Oneida Alar who is status post left carotid endarterectomy on 05/28/12 and right CEA on 01/29/08. He returns today for follow up and carotid artery Duplex.  Patient has Negative history of TIA or stroke symptom. The patient denies amaurosis fugax or monocular blindness. The patient denies facial drooping. Pt. denies hemiplegia. The patient denies receptive or expressive aphasia. Pt. denies extremity weakness. He denies steal symptoms in either arm. He denies claudication symptoms in LE's, denies non-healing wounds. ABI's requested by Dr. [name] and performed August 2014 reviewed, were normal.  Patient reports [place] or Surgical History: new arrhythmia, started on Xaralto by Dr. [name].  Pt Diabetic: Yes,A1C May 2015 was 6.4, in control Pt smoker: former smoker, quit in 1994  Pt meds include: Statin : No: he had to stop taking due to myalgias Betablocker: Yes ASA: no, stopped after Jennye Moccasin started Other anticoagulants/antiplatelets: Jennye Moccasin  Past Medical History  Diagnosis Date  . Coronary artery disease   . Hyperlipidemia   . Hypertension   . Obesity   . Carotid arterial disease   . Diabetes mellitus   . Torn rotator cuff 2012     Right arm  . Osteoarthritis     Left wrist  . Heart murmur   . Dyspnea     with exertion  . HOH (hard of hearing)   . Frequency of urination   . Bruises easily     Social History History  Substance Use Topics  . Smoking status: Former Smoker    Quit date: 01/24/1994  . Smokeless tobacco: Former Systems developer  . Alcohol Use: No    Family History Family History  Problem Relation Age of Onset  . Heart attack Father   . Heart attack Brother   . Parkinsonism Brother   . Cancer Brother   . Arrhythmia Brother     Surgical History Past Surgical History  Procedure Laterality Date  . Mole surgery    . Coronary artery bypass graft   03/2003    X3. LIMA GRAFT TO THE LAD, SAPHENOUS VEIN GRAFT TO THE PA, AND A LEFT RADIAL GRAFT TO THEOBTUSE MARGINAL VESSEL  . Cardiac catheterization  11/29/2005    SEVERE THREE VESSEL OBSTRUCTIVE ATHERSCLEROTIC CAD. ALL GRAFTS WERE PATENT. EF 65%  . Back surgery      X2  . Endarterectomy Left 05/28/2012    Procedure: ENDARTERECTOMY CAROTID;  Surgeon: Elam Dutch, MD;  Location: [place];  Service: Vascular;  Laterality: Left;  . Patch angioplasty Left 05/28/2012    Procedure: WITH dACRON PATCH ANGIOPLASTY;  Surgeon: Elam Dutch, MD;  Location: [facility] OR;  Service: Vascular;  Laterality: Left;  . Carotid endarterectomy Left 05-28-12    cea  . Carotid endarterectomy Right 01-29-08    cea    Allergies  Allergen Reactions  . Niacin And Related Rash    Red face and burns.    Current Outpatient Prescriptions  Medication Sig Dispense Refill  . acetaminophen (TYLENOL) 500 MG tablet Take 500 mg by mouth every 6 (six) hours as needed.    . ezetimibe (ZETIA) 10 MG tablet Take 1 tablet (10 mg total) by mouth daily. 30 tablet 2  . hydrochlorothiazide (HYDRODIURIL) 25 MG tablet Take 25 mg by mouth every morning.     . loratadine (CLARITIN) 10 MG tablet Take by mouth as needed.    . metFORMIN (GLUCOPHAGE) 500 MG tablet Take  500 mg by mouth daily with breakfast.     . OVER THE COUNTER MEDICATION Take 3 tablets by mouth daily with supper. Replenex for joint strength    . quinapril (ACCUPRIL) 40 MG tablet Take 40 mg by mouth daily.     . Rivaroxaban (XARELTO) 20 MG TABS tablet Take 1 tablet (20 mg total) by mouth daily with supper. 30 tablet 6  . vitamin B-12 (CYANOCOBALAMIN) 1000 MCG tablet Take 1,000 mcg by mouth daily.     No current facility-administered medications for this visit.    Review of Systems : See HPI for pertinent positives and negatives.  Physical Examination  Filed Vitals:   01/13/14 1202 01/13/14 1206  BP: 122/67 117/70  Pulse: 58 61  Resp:  16  Height:  6' (1.829 m)   Weight:  263 lb (119.296 kg)  SpO2:  95%   Body mass index is 35.66 kg/(m^2).  General: WDWN obese male in NAD GAIT: normal Eyes: PERRLA Pulmonary: CTAB, Negative Rales, Negative rhonchi, & Negative wheezing.  Cardiac: Irregular Rhythm, positive murmur  VASCULAR EXAM Carotid Bruits Left Right   Transmitted cardiac murmur transmitted cardiac murmur    Radial pulses: left is not palpable, right is 2+. Left brachial pulse is 3+.      LE Pulses LEFT RIGHT   POPLITEAL not palpable  not palpable    Gastrointestinal: soft, nontender, BS WNL, no r/g, no palpable masses, large abdomen (central adiposity).  Musculoskeletal: Negative muscle atrophy/wasting. M/S 5/5 throughout, Extremities without ischemic changes.  Neurologic: A&O X 3; Appropriate Affect ; SENSATION ;normal;  Speech is normal CN 2-12 intact except is hard of hearing, Pain and light touch intact in extremities, Motor exam as listed above.   Non-Invasive Vascular Imaging CAROTID DUPLEX 01/13/2014   CEREBROVASCULAR DUPLEX EVALUATION    INDICATION: Carotid artery stenosis    PREVIOUS INTERVENTION(S): Right carotid endarterectomy 01/29/2008;Left carotid endarterectomy 05/28/2012    DUPLEX EXAM:     RIGHT  LEFT  Peak Systolic Velocities (cm/s) End Diastolic Velocities (cm/s) Plaque LOCATION Peak Systolic Velocities (cm/s) End Diastolic Velocities (cm/s) Plaque  177 15 HT CCA PROXIMAL 77 8   91 17  CCA MID 110 14   87 13  CCA DISTAL 142 18 HT  107 15  ECA 68 8   58 19  ICA PROXIMAL 100 19   84 22  ICA MID 94 24   105 35  ICA DISTAL 97 26     carotid endarterectomy ICA / CCA Ratio (PSV) Carotid endarterectomy  To-Fro Vertebral Flow Antegrade  841 Brachial Systolic Pressure (mmHg) 660  Biphasic Brachial Artery Waveforms triphasic    Plaque Morphology:  HM  = Homogeneous, HT = Heterogeneous, CP = Calcific Plaque, SP = Smooth Plaque, IP = Irregular Plaque  ADDITIONAL FINDINGS:     IMPRESSION: Right internal carotid artery is patent with history of carotid endarterectomy, no hyperplasia or hemodynamically significant plaque visualized. Left common carotid artery disease present of less than 50%. Left internal carotid artery is patent with history of carotid endarterectomy, no hyperplasia or hemodynamically significant plaque present. Abnormal blood pressure gradient present with to-fro flow present involving the right vertebral artery which is consistent with known right subclavian steal syndrome.    Compared to the previous exam:  Unchanged since study on 01/10/2013.      Assessment: Evan [name] is a 73 y.o. male who is status post left carotid endarterectomy on 05/28/12 and right CEA on 01/29/08. He presents with asymptomatic patent  bilateral ICA's with history of carotid endarterectomies, left common carotid artery disease present of less than 50%.  Abnormal blood pressure gradient present with to-fro flow present involving the right vertebral artery which is consistent with known right subclavian steal syndrome; however, he has no steal symptoms in either upper extremity. The  ICA stenosis is  Unchanged from previous exam on 01/10/2013.  Plan: Follow-up in 1 year with Carotid Duplex.   I discussed in depth with the patient the nature of atherosclerosis, and emphasized the importance of maximal medical management including strict control of blood pressure, blood glucose, and lipid levels, obtaining regular exercise, and continued cessation of smoking.  The patient is aware that without maximal medical management the underlying atherosclerotic disease process will progress, limiting the benefit of any interventions. The patient was given information about stroke prevention and what symptoms should prompt the patient to seek immediate medical  care. Thank you for allowing [place] to participate in this patient's care.  Clemon Chambers, RN, MSN, FNP-C [facility] Office: [phone]  Clinic Physician: Trula Slade  01/13/2014 12:08 PM    VASCULAR QUALITY INITIATIVE FOLLOW UP DATA:  Current smoker: [  ] yes  [ X ] no  Living status: [ X ]  Home  [  ] Nursing home  [  ] Homeless    MEDS:  ASA [  ] yes  Valu.Nieves  ] no- Valu.Nieves ] medical reason  [  ] non compliant  STATIN  [  ] yes  [ X ] no- [ X ] medical reason  [  ] non compliant  Beta blocker [ X ] yes  [  ] no- [  ] medical reason  [  ] non compliant  ACE inhibitor Valu.Nieves  ] yes  [  ] no- [  ] medical reason  [  ] non compliant  P2Y12 Antagonist [ X ] none  [  ] clopidogrel-Plavix  [  ] ticlopidine-Ticlid   [  ] prasugrel-Effient  [  ] ticagrelor- Brilinta    Anticoagulant [  ] None  [  ] warfarin  Valu.Nieves ] rivaroxaban-Xarelto [  ] dabigatran- Pradaxa  Neurologic event since D/C:  Valu.Nieves  ] no  [  ] yes: [  ] eye event  [  ] cortical event  [  ] VB event  [  ] non specific event  [  ] right  [  ] left  [  ] TIA  [  ] stroke  Date:   Modified Rankin Score: 0

## 2014-01-14 NOTE — Addendum Note (Signed)
Addended by: Dorthula Rue L on: 01/14/2014 04:22 PM   Modules accepted: Orders

## 2014-02-25 ENCOUNTER — Other Ambulatory Visit: Payer: Self-pay | Admitting: *Deleted

## 2014-02-25 MED ORDER — RIVAROXABAN 20 MG PO TABS: 20.0000 mg | ORAL_TABLET | Freq: Every day | ORAL | Status: DC

## 2014-04-17 ENCOUNTER — Ambulatory Visit (INDEPENDENT_AMBULATORY_CARE_PROVIDER_SITE_OTHER): Payer: Medicare Other | Admitting: Cardiology

## 2014-04-17 ENCOUNTER — Encounter: Payer: Self-pay | Admitting: Cardiology

## 2014-04-17 VITALS — BP 160/82 | HR 52 | Ht 72.0 in | Wt 259.5 lb

## 2014-04-17 DIAGNOSIS — I739 Peripheral vascular disease, unspecified: Secondary | ICD-10-CM

## 2014-04-17 DIAGNOSIS — I779 Disorder of arteries and arterioles, unspecified: Secondary | ICD-10-CM | POA: Diagnosis not present

## 2014-04-17 DIAGNOSIS — I482 Chronic atrial fibrillation, unspecified: Secondary | ICD-10-CM

## 2014-04-17 DIAGNOSIS — I10 Essential (primary) hypertension: Secondary | ICD-10-CM | POA: Diagnosis not present

## 2014-04-17 DIAGNOSIS — I25119 Atherosclerotic heart disease of native coronary artery with unspecified angina pectoris: Secondary | ICD-10-CM

## 2014-04-17 DIAGNOSIS — I452 Bifascicular block: Secondary | ICD-10-CM

## 2014-04-17 DIAGNOSIS — E785 Hyperlipidemia, unspecified: Secondary | ICD-10-CM

## 2014-04-17 NOTE — Progress Notes (Signed)
Evan [name] Date of Birth: Jan 23, 1941   History of Present Illness: Evan [name] is seen today for followup CAD. He has a history of coronary disease and is status post coronary bypass surgery in 2005. He had a cardiac catheterization 2007 which demonstrated his grafts were patent. He had a nuclear stress test in January of 2013 which demonstrated no significant perfusion abnormalities. His ejection fraction was estimated 46% but visually it appeared normal. He underwent a left carotid endarterectomy in May 2014. He is intolerant to statins due to severe myalgias and weakness.  He has permanent Atrial fibrillation. He is minimally symptomatic. Due to his slow HR metoprolol dose was stopped. He is on Xarelto for anticoagulation. On follow up today he reports he gets occasional pain in his mid abdomen that is associated with SOB. He has a lot of aches and pains in his joints. He does note more swelling in his feet when he eats salt. Sugars typically run 130. He had carotid dopplers in December that were good. He does complain of pain in his legs with walking but prior arterial dopplers were normal.   Current Outpatient Prescriptions on File Prior to Visit  Medication Sig Dispense Refill  . acetaminophen (TYLENOL) 500 MG tablet Take 500 mg by mouth every 6 (six) hours as needed.    . ezetimibe (ZETIA) 10 MG tablet Take 1 tablet (10 mg total) by mouth daily. 30 tablet 2  . hydrochlorothiazide (HYDRODIURIL) 25 MG tablet Take 25 mg by mouth every morning.     . loratadine (CLARITIN) 10 MG tablet Take by mouth as needed.    . metFORMIN (GLUCOPHAGE) 500 MG tablet Take 500 mg by mouth 2 (two) times daily with a meal.     . OVER THE COUNTER MEDICATION Take 3 tablets by mouth daily with supper. Replenex for joint strength    . quinapril (ACCUPRIL) 40 MG tablet Take 40 mg by mouth daily.     . rivaroxaban (XARELTO) 20 MG TABS tablet Take 1 tablet (20 mg total) by mouth daily with supper. 30 tablet 6  .  vitamin B-12 (CYANOCOBALAMIN) 1000 MCG tablet Take 1,000 mcg by mouth daily.     No current facility-administered medications on file prior to visit.    Allergies  Allergen Reactions  . Niacin And Related Rash    Red face and burns.    Past Medical History  Diagnosis Date  . Coronary artery disease   . Hyperlipidemia   . Hypertension   . Obesity   . Carotid arterial disease   . Diabetes mellitus   . Torn rotator cuff 2012     Right arm  . Osteoarthritis     Left wrist  . Heart murmur   . Dyspnea     with exertion  . HOH (hard of hearing)   . Frequency of urination   . Bruises easily     Past Surgical History  Procedure Laterality Date  . Mole surgery    . Coronary artery bypass graft  03/2003    X3. LIMA GRAFT TO THE LAD, SAPHENOUS VEIN GRAFT TO THE PA, AND A LEFT RADIAL GRAFT TO THEOBTUSE MARGINAL VESSEL  . Cardiac catheterization  11/29/2005    SEVERE THREE VESSEL OBSTRUCTIVE ATHERSCLEROTIC CAD. ALL GRAFTS WERE PATENT. EF 65%  . Back surgery      X2  . Endarterectomy Left 05/28/2012    Procedure: ENDARTERECTOMY CAROTID;  Surgeon: Elam Dutch, MD;  Location: [place];  Service: Vascular;  Laterality: Left;  . Patch angioplasty Left 05/28/2012    Procedure: WITH dACRON PATCH ANGIOPLASTY;  Surgeon: Elam Dutch, MD;  Location: [facility] OR;  Service: Vascular;  Laterality: Left;  . Carotid endarterectomy Left 05-28-12    cea  . Carotid endarterectomy Right 01-29-08    cea    History  Smoking status  . Former Smoker  . Quit date: 01/24/1994  Smokeless tobacco  . Former User    History  Alcohol Use No    Family History  Problem Relation Age of Onset  . Heart attack Father   . Heart attack Brother   . Parkinsonism Brother   . Cancer Brother   . Arrhythmia Brother     Review of Systems: As noted in HPI. All other systems were reviewed and are negative.  Physical Exam: BP 160/82 mmHg  Pulse 52  Ht 6' (1.829 m)  Wt 259 lb 8 oz (117.708 kg)  BMI 35.19  kg/m2 He is an obese white male in no acute distress. He is normocephalic, atraumatic. Pupils equal round react to light accommodation. Extraocular movements are full. Oropharynx is clear. Neck is without JVD, adenopathy, thyromegaly, or bruits. Lungs reveal scattered wheezes. Cardiac exam reveals an IRR and grade 2/6 systolic ejection murmur the right upper sternal border radiating to the left sternal border. There is no diastolic murmur or S3. Abdomen is obese, soft, nontender. He has no masses or bruits. He has no edema. Femoral and pedal pulses are good.  LABORATORY DATA: Ecg today shows afib with slow ventricular response. Rate 52. RBBB and LAFB.   Assessment / Plan: 1. Coronary disease status post CABG in 2005. He has stable class I anginal symptoms. His Myoview study 2013 showed no significant ischemia. We'll continue with his medical therapy including aspirin, metoprolol, and ACE inhibitor. Intolerant to statins.  2. Hyperlipidemia, intolerant to statins. Now on Zetia. Will request copy of most recent labs at [facility]. If lipid panel not done we will request.   3. Diabetes mellitus type 2. He will continue metformin. Managed by primary care.   4. Hypertension   5. History of Trifascicular block. Patient is asymptomatic. On no rate slowing meds.  6. Carotid arterial disease status post left endarterectomy.  7. Atrial fibrillation- now permanent with slow rate. No symptoms of fatigue, dizziness or syncope. No indication for pacemaker at this time. Will monitor.   Continue Xarelto 20 mg daily. Avoid NSAIDs.  On no rate slowing medication due to bradycardia.

## 2014-04-17 NOTE — Patient Instructions (Signed)
You need to lose weight and avoid sodium  We will get a copy of your last lab work.  I will see you in 6 months.

## 2014-05-06 ENCOUNTER — Other Ambulatory Visit: Payer: Self-pay

## 2014-05-06 MED ORDER — EZETIMIBE 10 MG PO TABS: 10.0000 mg | ORAL_TABLET | Freq: Every day | ORAL | Status: DC

## 2014-05-12 ENCOUNTER — Encounter: Payer: Self-pay | Admitting: Cardiology

## 2014-06-02 ENCOUNTER — Telehealth: Payer: Self-pay | Admitting: Cardiology

## 2014-06-02 NOTE — Telephone Encounter (Signed)
Calling to see if Evan [name] can get some samples of Xarelto or Zeita . Please call    Thanks

## 2014-06-02 NOTE — Telephone Encounter (Signed)
Pt. Called and informed that we did not have any samples

## 2014-06-13 ENCOUNTER — Telehealth: Payer: Self-pay | Admitting: Cardiology

## 2014-06-13 ENCOUNTER — Other Ambulatory Visit: Payer: Self-pay | Admitting: Cardiology

## 2014-06-13 NOTE — Telephone Encounter (Signed)
Returned call to patient's wife.Office out of zetia and xarelto 20 mg samples.

## 2014-06-13 NOTE — Telephone Encounter (Signed)
Pt's wife called in requesting some samples of Zetia and Xarelto. Please call back  Thanks

## 2014-06-13 NOTE — Telephone Encounter (Signed)
error 

## 2014-07-04 ENCOUNTER — Encounter: Payer: Self-pay | Admitting: Cardiology

## 2014-07-15 ENCOUNTER — Telehealth: Payer: Self-pay

## 2014-07-15 NOTE — Telephone Encounter (Signed)
Received a call from patient's wife xarelto 20 mg samples left at front desk of [facility] office.

## 2014-08-13 ENCOUNTER — Telehealth: Payer: Self-pay | Admitting: Cardiology

## 2014-08-13 NOTE — Telephone Encounter (Signed)
Patient calling the office for samples of medication:   1.  What medication and dosage are you requesting samples for? Zetia and Xarelto  2.  Are you currently out of this medication?about 2 pills-Xarelto  And Zetia-2 pills left 3. Are you requesting samples to get you through until a mail order prescription arrives?no

## 2014-08-14 NOTE — Telephone Encounter (Signed)
No samples on hand at for either medication at this time. Informed patient. Offered refill. Offered to call [employer] to check - she will call - number given to patient.  Routing note.

## 2014-08-14 NOTE — Telephone Encounter (Signed)
Left message on patient's home phone, that we have no Zetia or Xarelto in stock.

## 2014-08-14 NOTE — Telephone Encounter (Signed)
Follow Up        Pt's wife calling to find out if we have samples of Zorelto. Please call back and advise.

## 2014-08-22 ENCOUNTER — Telehealth: Payer: Self-pay | Admitting: Cardiology

## 2014-08-22 NOTE — Telephone Encounter (Signed)
Patient calling the office for samples of medication:   1.  What medication and dosage are you requesting samples for? Xarelto and Zetia-Please call today if possible  2.  Are you currently out of this medication? 2 days left  3. Are you requesting samples to get you through until a mail order prescription arrives?no

## 2014-08-22 NOTE — Telephone Encounter (Signed)
Medication samples have been provided to the patient.  Drug name: xarelto 67  Qty: 25  LOT: [id number]  Exp.Date: 11/18  Samples left at front desk for patient pick-up. Patient notified. Advised to give a few more days notice when he needs samples, should we be out of stock.  Informed him we have not had zetia samples for months - will be generic later in the year.  Patient states he has enough medication to get to Monday, when he will pick samples up.   Sheral Apley M 3:44 PM 08/22/2014

## 2014-09-19 ENCOUNTER — Telehealth: Payer: Self-pay | Admitting: Cardiology

## 2014-09-19 ENCOUNTER — Telehealth: Payer: Self-pay

## 2014-09-19 MED ORDER — EZETIMIBE 10 MG PO TABS: 10.0000 mg | ORAL_TABLET | Freq: Every day | ORAL | Status: DC

## 2014-09-19 MED ORDER — RIVAROXABAN 20 MG PO TABS: 20.0000 mg | ORAL_TABLET | Freq: Every day | ORAL | Status: DC

## 2014-09-19 NOTE — Telephone Encounter (Signed)
Advised we are out of [place] samples at the [facility] office location - recommended to check w/ [facility] (will route this msg to their refill pool as well).  Also sent in refill to local pharmacy per caller's request.

## 2014-09-19 NOTE — Telephone Encounter (Signed)
Samples of Xarelto 20mg  4 bottles provided per patient request. i mentioned Patient Assistance program, but patient's wife states he doesn't qualify because she still works.

## 2014-09-19 NOTE — Telephone Encounter (Signed)
Samples provided. 4 bottles only.

## 2014-09-19 NOTE — Telephone Encounter (Signed)
Mrs. Mcnatt states her husband needs samples of Zetia and Xarelto.  He is almost out of medication.

## 2014-09-19 NOTE — Telephone Encounter (Signed)
Mrs. Medlen states her husband needs samples of Zetia and Xarelto.  He is almost out of medication.

## 2014-10-24 ENCOUNTER — Encounter: Payer: Self-pay | Admitting: Cardiology

## 2014-10-24 ENCOUNTER — Ambulatory Visit (INDEPENDENT_AMBULATORY_CARE_PROVIDER_SITE_OTHER): Payer: Medicare Other | Admitting: Cardiology

## 2014-10-24 ENCOUNTER — Other Ambulatory Visit: Payer: Self-pay

## 2014-10-24 VITALS — BP 170/80 | HR 62 | Ht 72.0 in | Wt 270.2 lb

## 2014-10-24 DIAGNOSIS — I482 Chronic atrial fibrillation, unspecified: Secondary | ICD-10-CM

## 2014-10-24 DIAGNOSIS — I779 Disorder of arteries and arterioles, unspecified: Secondary | ICD-10-CM

## 2014-10-24 DIAGNOSIS — E785 Hyperlipidemia, unspecified: Secondary | ICD-10-CM | POA: Diagnosis not present

## 2014-10-24 DIAGNOSIS — Z23 Encounter for immunization: Secondary | ICD-10-CM

## 2014-10-24 DIAGNOSIS — I25119 Atherosclerotic heart disease of native coronary artery with unspecified angina pectoris: Secondary | ICD-10-CM | POA: Diagnosis not present

## 2014-10-24 DIAGNOSIS — I10 Essential (primary) hypertension: Secondary | ICD-10-CM

## 2014-10-24 DIAGNOSIS — R351 Nocturia: Secondary | ICD-10-CM

## 2014-10-24 DIAGNOSIS — I739 Peripheral vascular disease, unspecified: Secondary | ICD-10-CM

## 2014-10-24 MED ORDER — TAMSULOSIN HCL 0.4 MG PO CAPS: 0.4000 mg | ORAL_CAPSULE | Freq: Every day | ORAL | Status: DC

## 2014-10-24 NOTE — Telephone Encounter (Signed)
Samples of Xarelto 20mg  3 bottles provided to patient.

## 2014-10-24 NOTE — Progress Notes (Signed)
Evan [name] Date of Birth: 03/08/1940   History of Present Illness: Evan [name] is seen today for followup CAD. He has a history of coronary disease and is status post coronary bypass surgery in 2005. He had a cardiac catheterization 2007 which demonstrated his grafts were patent. He had a nuclear stress test in January of 2013 which demonstrated no significant perfusion abnormalities. His ejection fraction was estimated 46% but visually it appeared normal. He underwent a left carotid endarterectomy in May 2014. He is intolerant to statins due to severe myalgias and weakness.  He has permanent Atrial fibrillation. He is minimally symptomatic. Due to his slow HR metoprolol dose was stopped. He is on Xarelto for anticoagulation.  On follow up today he reports he has some fatigue and does get SOB with exertion. He has gained 11 lbs and admits that his diet has been poor. He does note more swelling in his feet when he eats salt. He also complains of frequent nocturia and incomplete bladder emptying.   Current Outpatient Prescriptions on File Prior to Visit  Medication Sig Dispense Refill  . acetaminophen (TYLENOL) 500 MG tablet Take 500 mg by mouth every 6 (six) hours as needed.    . ezetimibe (ZETIA) 10 MG tablet Take 1 tablet (10 mg total) by mouth daily. 30 tablet 6  . hydrochlorothiazide (HYDRODIURIL) 25 MG tablet Take 25 mg by mouth every morning.     . loratadine (CLARITIN) 10 MG tablet Take by mouth as needed.    . metFORMIN (GLUCOPHAGE) 500 MG tablet Take 500 mg by mouth 2 (two) times daily with a meal.     . OVER THE COUNTER MEDICATION Take 3 tablets by mouth daily with supper. Replenex for joint strength    . quinapril (ACCUPRIL) 40 MG tablet Take 40 mg by mouth daily.     . rivaroxaban (XARELTO) 20 MG TABS tablet Take 1 tablet (20 mg total) by mouth daily with supper. 30 tablet 6  . vitamin B-12 (CYANOCOBALAMIN) 1000 MCG tablet Take 1,000 mcg by mouth daily.     No current  facility-administered medications on file prior to visit.    Allergies  Allergen Reactions  . Niacin And Related Rash    Red face and burns.    Past Medical History  Diagnosis Date  . Coronary artery disease   . Hyperlipidemia   . Hypertension   . Obesity   . Carotid arterial disease   . Diabetes mellitus   . Torn rotator cuff 2012     Right arm  . Osteoarthritis     Left wrist  . Heart murmur   . Dyspnea     with exertion  . HOH (hard of hearing)   . Frequency of urination   . Bruises easily     Past Surgical History  Procedure Laterality Date  . Mole surgery    . Coronary artery bypass graft  03/2003    X3. LIMA GRAFT TO THE LAD, SAPHENOUS VEIN GRAFT TO THE PA, AND A LEFT RADIAL GRAFT TO THEOBTUSE MARGINAL VESSEL  . Cardiac catheterization  11/29/2005    SEVERE THREE VESSEL OBSTRUCTIVE ATHERSCLEROTIC CAD. ALL GRAFTS WERE PATENT. EF 65%  . Back surgery      X2  . Endarterectomy Left 05/28/2012    Procedure: ENDARTERECTOMY CAROTID;  Surgeon: Elam Dutch, MD;  Location: [facility];  Service: Vascular;  Laterality: Left;  . Patch angioplasty Left 05/28/2012    Procedure: WITH dACRON PATCH ANGIOPLASTY;  Surgeon:  Elam Dutch, MD;  Location[facility] OR;  Service: Vascular;  Laterality: Left;  . Carotid endarterectomy Left 05-28-12    cea  . Carotid endarterectomy Right 01-29-08    cea    History  Smoking status  . Former Smoker  . Quit date: 01/24/1994  Smokeless tobacco  . Former User    History  Alcohol Use No    Family History  Problem Relation Age of Onset  . Heart attack Father   . Heart attack Brother   . Parkinsonism Brother   . Cancer Brother   . Arrhythmia Brother     Review of Systems: As noted in HPI. All other systems were reviewed and are negative.  Physical Exam: BP 170/80 mmHg  Pulse 62  Ht 6' (1.829 m)  Wt 122.562 kg (270 lb 3.2 oz)  BMI 36.64 kg/m2 He is an obese white male in no acute distress. He is normocephalic, atraumatic. Pupils  equal round react to light accommodation. Extraocular movements are full. Oropharynx is clear. Neck is without JVD, adenopathy, thyromegaly, or bruits. Lungs reveal scattered wheezes. Cardiac exam reveals an IRR and grade 2/6 systolic ejection murmur the right upper sternal border radiating to the left sternal border. There is no diastolic murmur or S3. Abdomen is obese, soft, nontender. He has no masses or bruits. He has no edema. Femoral and pedal pulses are good.  LABORATORY DATA: Lab Results  Component Value Date   WBC 6.4 02/20/2013   HGB 13.3 02/20/2013   HCT 41.3 02/20/2013   PLT 179.0 02/20/2013   GLUCOSE 116* 02/20/2013   CHOL 176 02/20/2013   TRIG 105.0 02/20/2013   HDL 38.60* 02/20/2013   LDLCALC 116* 02/20/2013   ALT 21 02/20/2013   AST 25 02/20/2013   NA 136 02/20/2013   K 4.2 02/20/2013   CL 102 02/20/2013   CREATININE 1.2 02/20/2013   BUN 17 02/20/2013   CO2 27 02/20/2013   INR 0.96 05/24/2012   HGBA1C 6.2* 05/29/2012     Assessment / Plan: 1. Coronary disease status post CABG in 2005. He has stable class I-2 anginal symptoms. His Myoview study 2013 showed no significant ischemia. We'll continue with his medical therapy including aspirin, metoprolol, and ACE inhibitor. Intolerant to statins.  2. Hyperlipidemia, intolerant to statins. Now on Zetia.   3. Diabetes mellitus type 2. He will continue metformin. Managed by primary care.   4. Hypertension - poorly controlled today. Will work on lifestyle modification with weight loss and sodium restriction. If BP remains high will consider additional therapy but need to avoid rate slowing meds.   5. History of Trifascicular block. Patient is asymptomatic. On no rate slowing meds.  6. Carotid arterial disease status post left endarterectomy. Carotid dopplers in Dec 2015 showed less than 50% stenosis. 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  7. Atrial fibrillation- now permanent with slow rate. No symptoms of  dizziness  or syncope. No indication for pacemaker at this time. Will monitor.   Continue Xarelto 20 mg daily. Avoid NSAIDs.  On no rate slowing medication due to bradycardia.   8. Nocturia most likely due to BPH- will give a trial of Flomax 0.4 mg daily.

## 2014-10-24 NOTE — Patient Instructions (Signed)
Start Flomax 0.4 mg daily at supper.  Continue your current medication  You need to lose weight and restrict your salt intake.  I will see you in 6-8 months.

## 2014-10-29 ENCOUNTER — Telehealth: Payer: Self-pay

## 2014-10-29 NOTE — Telephone Encounter (Signed)
Returned call to patient's wife no answer.LMTC. 

## 2014-10-30 ENCOUNTER — Telehealth: Payer: Self-pay

## 2014-10-30 NOTE — Telephone Encounter (Signed)
Received a call from patient's wife requesting xarelto 20 mg samples.Samples left at front desk for pick up.

## 2014-11-06 ENCOUNTER — Other Ambulatory Visit: Payer: Self-pay

## 2014-12-01 ENCOUNTER — Telehealth: Payer: Self-pay | Admitting: Cardiology

## 2014-12-01 NOTE — Telephone Encounter (Signed)
Returned call to patient office out of xarelto 20 mg samples.

## 2014-12-01 NOTE — Telephone Encounter (Signed)
Please call her,she did not give any details.

## 2014-12-02 NOTE — Telephone Encounter (Signed)
Returned call to patient.Xarelto 20 mg samples will be left at [employer] office front desk.

## 2014-12-23 ENCOUNTER — Telehealth: Payer: Self-pay

## 2014-12-23 NOTE — Telephone Encounter (Signed)
Received a call from patient's wife.She stated husband has a cold with a productive cough.Stated he saw PCP yesterday and he started taking prednisone taper and antibiotics.Stated she wanted to make sure ok to take prednisone.Advised ok to take prednisone as prescribed.

## 2015-01-09 ENCOUNTER — Encounter: Payer: Self-pay | Admitting: Family

## 2015-01-12 ENCOUNTER — Telehealth: Payer: Self-pay | Admitting: Cardiology

## 2015-01-12 NOTE — Telephone Encounter (Signed)
Returned call to patient.Samples of Xarelto 20 mg left at [employer] office front desk.

## 2015-01-12 NOTE — Telephone Encounter (Signed)
Please call,concerning his blood thinner.

## 2015-01-14 ENCOUNTER — Encounter: Payer: Self-pay | Admitting: Family

## 2015-01-15 ENCOUNTER — Encounter (HOSPITAL_COMMUNITY): Payer: Medicare Other

## 2015-01-15 ENCOUNTER — Ambulatory Visit: Payer: Medicare Other | Admitting: Family

## 2015-01-20 ENCOUNTER — Other Ambulatory Visit: Payer: Self-pay | Admitting: Family

## 2015-01-20 ENCOUNTER — Encounter: Payer: Self-pay | Admitting: Family

## 2015-01-20 ENCOUNTER — Ambulatory Visit (INDEPENDENT_AMBULATORY_CARE_PROVIDER_SITE_OTHER): Payer: Medicare Other | Admitting: Family

## 2015-01-20 ENCOUNTER — Ambulatory Visit (HOSPITAL_COMMUNITY)
Admission: RE | Admit: 2015-01-20 | Discharge: 2015-01-20 | Disposition: A | Discharge status: Home / Self Care | Payer: Medicare Other | Source: Ambulatory Visit | Attending: Family | Admitting: Family

## 2015-01-20 VITALS — BP 127/69 | HR 64 | Temp 97.6°F | Resp 16 | Ht 72.0 in | Wt 264.0 lb

## 2015-01-20 DIAGNOSIS — I6523 Occlusion and stenosis of bilateral carotid arteries: Secondary | ICD-10-CM

## 2015-01-20 DIAGNOSIS — I10 Essential (primary) hypertension: Secondary | ICD-10-CM | POA: Insufficient documentation

## 2015-01-20 DIAGNOSIS — E785 Hyperlipidemia, unspecified: Secondary | ICD-10-CM | POA: Insufficient documentation

## 2015-01-20 DIAGNOSIS — Z48812 Encounter for surgical aftercare following surgery on the circulatory system: Secondary | ICD-10-CM

## 2015-01-20 DIAGNOSIS — E119 Type 2 diabetes mellitus without complications: Secondary | ICD-10-CM | POA: Diagnosis not present

## 2015-01-20 DIAGNOSIS — Z9889 Other specified postprocedural states: Secondary | ICD-10-CM | POA: Diagnosis not present

## 2015-01-20 NOTE — Patient Instructions (Signed)
Stroke Prevention Some medical conditions and behaviors are associated with an increased chance of having a stroke. You may prevent a stroke by making healthy choices and managing medical conditions. HOW CAN I REDUCE MY RISK OF HAVING A STROKE?   Stay physically active. Get at least 30 minutes of activity on most or all days.  Do not smoke. It may also be helpful to avoid exposure to secondhand smoke.  Limit alcohol use. Moderate alcohol use is considered to be:  No more than 2 drinks per day for men.  No more than 1 drink per day for nonpregnant women.  Eat healthy foods. This involves:  Eating 5 or more servings of fruits and vegetables a day.  Making dietary changes that address high blood pressure (hypertension), high cholesterol, diabetes, or obesity.  Manage your cholesterol levels.  Making food choices that are high in fiber and low in saturated fat, trans fat, and cholesterol may control cholesterol levels.  Take any prescribed medicines to control cholesterol as directed by your health care provider.  Manage your diabetes.  Controlling your carbohydrate and sugar intake is recommended to manage diabetes.  Take any prescribed medicines to control diabetes as directed by your health care provider.  Control your hypertension.  Making food choices that are low in salt (sodium), saturated fat, trans fat, and cholesterol is recommended to manage hypertension.  Ask your health care provider if you need treatment to lower your blood pressure. Take any prescribed medicines to control hypertension as directed by your health care provider.  If you are 18-39 years of age, have your blood pressure checked every 3-5 years. If you are 40 years of age or older, have your blood pressure checked every year.  Maintain a healthy weight.  Reducing calorie intake and making food choices that are low in sodium, saturated fat, trans fat, and cholesterol are recommended to manage  weight.  Stop drug abuse.  Avoid taking birth control pills.  Talk to your health care provider about the risks of taking birth control pills if you are over 35 years old, smoke, get migraines, or have ever had a blood clot.  Get evaluated for sleep disorders (sleep apnea).  Talk to your health care provider about getting a sleep evaluation if you snore a lot or have excessive sleepiness.  Take medicines only as directed by your health care provider.  For some people, aspirin or blood thinners (anticoagulants) are helpful in reducing the risk of forming abnormal blood clots that can lead to stroke. If you have the irregular heart rhythm of atrial fibrillation, you should be on a blood thinner unless there is a good reason you cannot take them.  Understand all your medicine instructions.  Make sure that other conditions (such as anemia or atherosclerosis) are addressed. SEEK IMMEDIATE MEDICAL CARE IF:   You have sudden weakness or numbness of the face, arm, or leg, especially on one side of the body.  Your face or eyelid droops to one side.  You have sudden confusion.  You have trouble speaking (aphasia) or understanding.  You have sudden trouble seeing in one or both eyes.  You have sudden trouble walking.  You have dizziness.  You have a loss of balance or coordination.  You have a sudden, severe headache with no known cause.  You have new chest pain or an irregular heartbeat. Any of these symptoms may represent a serious problem that is an emergency. Do not wait to see if the symptoms will   go away. Get medical help at once. Call your local emergency services (911 in [place] Do not drive yourself to the hospital.   This information is not intended to replace advice given to you by your health care provider. Make sure you discuss any questions you have with your health care provider.   Document Released: 02/18/2004 Document Revised: 01/31/2014 Document Reviewed:  07/13/2012 Elsevier Interactive Patient Education 2016 Elsevier Inc.  

## 2015-01-20 NOTE — Addendum Note (Signed)
Addended by: Dorthula Rue L on: 01/20/2015 02:07 PM   Modules accepted: Orders

## 2015-01-20 NOTE — Progress Notes (Signed)
Chief Complaint: Extracranial Carotid Artery Stenosis   History of Present Illness  Evan [name] is a 74 y.o. male patient of Dr. Oneida Alar who is status post left carotid endarterectomy on 05/28/12 and right CEA on 01/29/08. He returns today for follow up and carotid artery Duplex.  The patient denies any symptoms of stroke or TIA.Specifically he denies a hx of amaurosis fugax or monocular blindness. The patient denies facial drooping. Pt. denies hemiplegia. The patient denies receptive or expressive aphasia. Pt. denies extremity weakness. He denies steal symptoms in either arm. He denies claudication symptoms in LE's, denies non-healing wounds. ABI's requested by Dr. [name] and performed August 2014 reviewed, were normal.  Patient reports [place] or Surgical History: none new arrhythmia, started on Xaralto by Dr. [name].   Pt Diabetic: Yes, A1C June 2016 was 6.6, in control (review of records) Pt smoker: former smoker, quit in 1994  Pt meds include: Statin : No: he had to stop taking due to myalgias; he thinks this is what caused his left leg weakness; is not worsening since he stopped the statin. Betablocker: no, stopped due to bradycardia ASA: no, stopped after Jennye Moccasin started Other anticoagulants/antiplatelets: Jennye Moccasin    Past Medical History  Diagnosis Date  . Coronary artery disease   . Hyperlipidemia   . Hypertension   . Obesity   . Carotid arterial disease ([place])   . Diabetes mellitus   . Torn rotator cuff 2012     Right arm  . Osteoarthritis     Left wrist  . Heart murmur   . Dyspnea     with exertion  . HOH (hard of hearing)   . Frequency of urination   . Bruises easily     Social History Social History  Substance Use Topics  . Smoking status: Former Smoker    Quit date: 01/24/1994  . Smokeless tobacco: Former Systems developer  . Alcohol Use: No    Family History Family History  Problem Relation Age of Onset  . Heart attack Father   . Heart attack  Brother   . Parkinsonism Brother   . Cancer Brother   . Arrhythmia Brother     Surgical History Past Surgical History  Procedure Laterality Date  . Mole surgery    . Coronary artery bypass graft  03/2003    X3. LIMA GRAFT TO THE LAD, SAPHENOUS VEIN GRAFT TO THE PA, AND A LEFT RADIAL GRAFT TO THEOBTUSE MARGINAL VESSEL  . Cardiac catheterization  11/29/2005    SEVERE THREE VESSEL OBSTRUCTIVE ATHERSCLEROTIC CAD. ALL GRAFTS WERE PATENT. EF 65%  . Back surgery      X2  . Endarterectomy Left 05/28/2012    Procedure: ENDARTERECTOMY CAROTID;  Surgeon: Elam Dutch, MD;  Location: [facility];  Service: Vascular;  Laterality: Left;  . Patch angioplasty Left 05/28/2012    Procedure: WITH dACRON PATCH ANGIOPLASTY;  Surgeon: Elam Dutch, MD;  Location: [facility] OR;  Service: Vascular;  Laterality: Left;  . Carotid endarterectomy Left 05-28-12    cea  . Carotid endarterectomy Right 01-29-08    cea    Allergies  Allergen Reactions  . Niacin And Related Rash    Red face and burns.    Current Outpatient Prescriptions  Medication Sig Dispense Refill  . acetaminophen (TYLENOL) 500 MG tablet Take 500 mg by mouth every 6 (six) hours as needed.    . ezetimibe (ZETIA) 10 MG tablet Take 1 tablet (10 mg total) by mouth daily. 30 tablet 6  .  hydrochlorothiazide (HYDRODIURIL) 25 MG tablet Take 25 mg by mouth every morning.     . loratadine (CLARITIN) 10 MG tablet Take by mouth as needed.    . metFORMIN (GLUCOPHAGE) 500 MG tablet Take 500 mg by mouth 2 (two) times daily with a meal.     . OVER THE COUNTER MEDICATION Take 3 tablets by mouth daily with supper. Replenex for joint strength    . quinapril (ACCUPRIL) 40 MG tablet Take 40 mg by mouth daily.     . rivaroxaban (XARELTO) 20 MG TABS tablet Take 1 tablet (20 mg total) by mouth daily with supper. 30 tablet 6  . tamsulosin (FLOMAX) 0.4 MG CAPS capsule Take 1 capsule (0.4 mg total) by mouth daily after supper. 30 capsule 11  . vitamin B-12 (CYANOCOBALAMIN)  1000 MCG tablet Take 1,000 mcg by mouth daily.     No current facility-administered medications for this visit.    Review of Systems : See HPI for pertinent positives and negatives.  Physical Examination  Filed Vitals:   01/20/15 1106 01/20/15 1107  BP: 132/68 127/69  Pulse: 60 64  Temp:  97.6 F (36.4 C)  TempSrc:  Oral  Resp:  16  Height:  6' (1.829 m)  Weight:  264 lb (119.75 kg)  SpO2:  96%   Body mass index is 35.8 kg/(m^2).  General: WDWN obese male in NAD GAIT: normal Eyes: PERRLA Pulmonary: CTAB, no rales, rhonchi, or wheezing.  Cardiac: Irregular Rhythm, no detected murmur  VASCULAR EXAM Carotid Bruits Left Right   negative positive    Radial pulses: left is faintly palpable, right is 2+. Left brachial pulse is 3+.      LE Pulses LEFT RIGHT   POPLITEAL not palpable  not palpable    Gastrointestinal: soft, nontender, BS WNL, no r/g, no palpable masses, large abdomen (central adiposity).  Musculoskeletal: Negative muscle atrophy/wasting. M/S 5/5 throughout, Extremities without ischemic changes.  Neurologic: A&O X 3; Appropriate Affect, Speech is normal CN 2-12 intact except is hard of hearing, Pain and light touch intact in extremities, Motor exam as listed above.         Non-Invasive Vascular Imaging CAROTID DUPLEX 01/20/2015   CEREBROVASCULAR DUPLEX EVALUATION    INDICATION:     PREVIOUS INTERVENTION(S): Right carotid endarterectomy 01/29/2008; Left carotid endarterectomy 05/28/2012    DUPLEX EXAM:     RIGHT  LEFT  Peak Systolic Velocities (cm/s) End Diastolic Velocities (cm/s) Plaque LOCATION Peak Systolic Velocities (cm/s) End Diastolic Velocities (cm/s) Plaque  170 20  CCA PROXIMAL 117 14   105 13 HM CCA MID 95 18 HT  106 15  CCA DISTAL 186 25   108 9  ECA 96 11   69 15   ICA PROXIMAL 119 31   90 24  ICA MID 112 27   76 21  ICA DISTAL 80 26     Carotid endarterectomy ICA / CCA Ratio (PSV) Carotid endarterectomy  Bidirectional Vertebral Flow Antegrade  0000000 Brachial Systolic Pressure (mmHg) 123456  Biphasic Brachial Artery Waveforms Biphasic    Plaque Morphology:  HM = Homogeneous, HT = Heterogeneous, CP = Calcific Plaque, SP = Smooth Plaque, IP = Irregular Plaque     ADDITIONAL FINDINGS: Right subclavian artery PSV130cm/sec; Left subclavian artery PSV182cm/sec    IMPRESSION: Bilateral internal carotid arteries are patent with history of carotid endarterectomy, without hemodynamic changes present. Left mid common carotid artery disease present of less than 50%. Blood pressure gradient present with abnormal right vertebral artery flow  suggestive of subclavian steal syndrome.    Compared to the previous exam:  Essentially unchanged since previous study on 01/13/2014.      Assessment: Evan [name] is a 74 y.o. male who is status post left carotid endarterectomy on 05/28/12 and right CEA on 01/29/08. He has no hx of stroke or TIA. He has no steal sx's in either hand or arm.  ABI's in 2014 were normal.  Today's carotid duplex suggests no restenosis in the bilateral ICA's, both CEA sites. Essentially unchanged since previous study on 01/13/2014.   Fortunately his DM remains in good control and he stopped smoking in 1994. He takes Chief Strategy Officer for atrial fib.   Plan: Follow-up in 1 year with Carotid Duplex scan.   I discussed in depth with the patient the nature of atherosclerosis, and emphasized the importance of maximal medical management including strict control of blood pressure, blood glucose, and lipid levels, obtaining regular exercise, and continued cessation of smoking.  The patient is aware that without maximal medical management the underlying atherosclerotic disease process will progress, limiting the benefit of any interventions. The patient was  given information about stroke prevention and what symptoms should prompt the patient to seek immediate medical care. Thank you for allowing [place] to participate in this patient's care.  Clemon Chambers, RN, MSN, FNP-C [facility] Office: [phone]  Clinic Physician: Kellie Simmering  01/20/2015 11:22 AM

## 2015-01-23 ENCOUNTER — Other Ambulatory Visit: Payer: Self-pay | Admitting: Cardiology

## 2015-01-23 MED ORDER — TAMSULOSIN HCL 0.4 MG PO CAPS: 0.4000 mg | ORAL_CAPSULE | Freq: Every day | ORAL | Status: DC

## 2015-01-23 NOTE — Telephone Encounter (Signed)
Spoke to pt wife, informed her that Mr. Hardiman Rx was sent in to the pharmacy.

## 2015-01-23 NOTE — Telephone Encounter (Signed)
°*  STAT* If patient is at the pharmacy, call can be transferred to refill team.   1. Which medications need to be refilled? (please list name of each medication and dose if known)Tamsulosin HCL 2. Which pharmacy/location (including street and city if local pharmacy) is medication to be sent to? [facility]-[phone]  3. Do they need a 30 day or 90 day supply? 30 and refills

## 2015-02-03 ENCOUNTER — Telehealth: Payer: Self-pay | Admitting: Cardiology

## 2015-02-03 NOTE — Telephone Encounter (Signed)
Medication Samples have been provided to the patient.  Drug name: Xarelto  Qty: # 28 (4 bottles 7 each)  LOT: [id number]  Exp.Date: 08-19  The patient has been instructed regarding the correct time, dose, and frequency of taking this medication, including desired effects and most common side effects.   Left in bag  at front desk with patient name and DOB.  [name] 4:52 PM 02/03/2015

## 2015-02-03 NOTE — Telephone Encounter (Signed)
Patient calling the office for samples of medication:   1.  What medication and dosage are you requesting samples for? Xarelto  2.  Are you currently out of this medication?  2 left   

## 2015-02-11 MED ORDER — BENAZEPRIL HCL 40 MG PO TABS: 40.0000 mg | ORAL_TABLET | Freq: Every day | ORAL | Status: DC

## 2015-02-11 NOTE — Telephone Encounter (Signed)
Patient's wife walked in office.Patient's insurance no longer covers generic zetia.[facility] called patient's ID # [id number].Exception started to approve generic zetia.Reference # [id number].Will be notified by fax in 24 hrs if approved.Also insurance will no longer cover quinapril.Dr.Jordan advised he can take benazepril 40 mg daily which is covered.Advised to finish quinapril then start benazepril.

## 2015-02-13 ENCOUNTER — Telehealth: Payer: Self-pay

## 2015-02-13 NOTE — Telephone Encounter (Signed)
Received fax from [employer] notice of denial for generic zetia.

## 2015-02-19 ENCOUNTER — Telehealth: Payer: Self-pay | Admitting: Cardiology

## 2015-02-19 MED ORDER — EZETIMIBE 10 MG PO TABS: 10.0000 mg | ORAL_TABLET | Freq: Every day | ORAL | Status: DC

## 2015-02-19 NOTE — Telephone Encounter (Signed)
New message    Patient calling wife calling - company will not pay for genetic Zetia.  Wants to discuss

## 2015-02-19 NOTE — Telephone Encounter (Signed)
Returned call to patient's wife she stated insurance will cover brand name zetia.Refill sent to pharmacy.

## 2015-03-02 ENCOUNTER — Telehealth: Payer: Self-pay | Admitting: Cardiology

## 2015-03-02 NOTE — Telephone Encounter (Signed)
Will forward to [facility] LPN with Dr [name]

## 2015-03-02 NOTE — Telephone Encounter (Signed)
Returned call to patient's wife.She stated PCP called in Lisinopril after Benazepril was called in.Stated he will take Benazepril.She will notify PCP.Xarelto 20 mg samples left at [employer] office front desk.

## 2015-03-02 NOTE — Telephone Encounter (Signed)
New message    Wife calling   Two medication was called in Benazepril 40 mg  & Lisinopril  40 mg Which medication should he take.    Patient calling the office for samples of medication:   1.  What medication and dosage are you requesting samples for? xarelto 20 mg   2.  Are you currently out of this medication? Yes

## 2015-03-17 ENCOUNTER — Telehealth: Payer: Self-pay | Admitting: Cardiology

## 2015-03-17 NOTE — Telephone Encounter (Signed)
Returned call to patient's wife.She stated for the last 3 days husband's feet have been swollen.No sob.No weight gain.Stated he is on a low salt diet.Stated she would like him to see Dr.Jordan before 04/15/15.Appointment scheduled with Dr.Jordan 03/20/15 at 8:30 am.Message sent to [place] for review.

## 2015-03-17 NOTE — Telephone Encounter (Signed)
New message      Pt c/o swelling: STAT is pt has developed SOB within 24 hours  1. How long have you been experiencing swelling? 3-4 days 2. Where is the swelling located?  Both feet 3.  Are you currently taking a "fluid pill"? yes 4.  Are you currently SOB?  no  5.  Have you traveled recently? no

## 2015-03-17 NOTE — Telephone Encounter (Signed)
Not sure what is causing swelling. No history of CHF. Will assess at office visit.   [name] [name] MD, [facility]

## 2015-03-20 ENCOUNTER — Encounter: Payer: Self-pay | Admitting: Cardiology

## 2015-03-20 ENCOUNTER — Ambulatory Visit (INDEPENDENT_AMBULATORY_CARE_PROVIDER_SITE_OTHER): Payer: Medicare Other | Admitting: Cardiology

## 2015-03-20 ENCOUNTER — Ambulatory Visit: Payer: Medicare Other | Admitting: Cardiology

## 2015-03-20 ENCOUNTER — Ambulatory Visit (INDEPENDENT_AMBULATORY_CARE_PROVIDER_SITE_OTHER): Payer: Medicare Other

## 2015-03-20 VITALS — BP 132/64 | HR 54 | Ht 72.0 in | Wt 266.0 lb

## 2015-03-20 DIAGNOSIS — I482 Chronic atrial fibrillation, unspecified: Secondary | ICD-10-CM

## 2015-03-20 DIAGNOSIS — R001 Bradycardia, unspecified: Secondary | ICD-10-CM | POA: Insufficient documentation

## 2015-03-20 DIAGNOSIS — I25119 Atherosclerotic heart disease of native coronary artery with unspecified angina pectoris: Secondary | ICD-10-CM | POA: Diagnosis not present

## 2015-03-20 DIAGNOSIS — E785 Hyperlipidemia, unspecified: Secondary | ICD-10-CM

## 2015-03-20 DIAGNOSIS — I10 Essential (primary) hypertension: Secondary | ICD-10-CM

## 2015-03-20 DIAGNOSIS — R600 Localized edema: Secondary | ICD-10-CM

## 2015-03-20 NOTE — Addendum Note (Signed)
Addended by: Kathyrn Lass on: 03/20/2015 10:33 AM   Modules accepted: Orders

## 2015-03-20 NOTE — Progress Notes (Signed)
Evan [name] Date of Birth: 1940/05/07   History of Present Illness: [name] is seen today for evaluation of edema. He has a history of coronary disease and is status post coronary bypass surgery in 2005. He had a cardiac catheterization 2007 which demonstrated his grafts were patent. He had a nuclear stress test in January of 2013 which demonstrated no significant perfusion abnormalities. His ejection fraction was estimated 46% but visually it appeared normal. He underwent a left carotid endarterectomy in May 2014. He is intolerant to statins due to severe myalgias and weakness.  He has permanent Atrial fibrillation for the past 2 years. Due to his slow HR metoprolol dose was stopped. He is on Xarelto for anticoagulation. Recently he has noted more swelling in his feet and legs. He states he doesn't add salt to his food but still eats high sodium foods such as snack foods, [employer], and canned beans. He has also been sitting up more at night with feet dangling due to pain in left shoulder/arm. No dyspnea at rest. He does note he gets fatigued and SOB easily with activity and can't do as much as he used to .   Current Outpatient Prescriptions on File Prior to Visit  Medication Sig Dispense Refill  . benazepril (LOTENSIN) 40 MG tablet Take 1 tablet (40 mg total) by mouth daily. 30 tablet 6  . ezetimibe (ZETIA) 10 MG tablet Take 1 tablet (10 mg total) by mouth daily. 30 tablet 6  . hydrochlorothiazide (HYDRODIURIL) 25 MG tablet Take 25 mg by mouth every morning.     . loratadine (CLARITIN) 10 MG tablet Take by mouth as needed.    . metFORMIN (GLUCOPHAGE) 500 MG tablet Take 500 mg by mouth 2 (two) times daily with a meal.     . OVER THE COUNTER MEDICATION Take 3 tablets by mouth daily with supper. Replenex for joint strength    . rivaroxaban (XARELTO) 20 MG TABS tablet Take 1 tablet (20 mg total) by mouth daily with supper. 30 tablet 6  . tamsulosin (FLOMAX) 0.4 MG CAPS capsule Take 1 capsule  (0.4 mg total) by mouth daily after supper. 30 capsule 4  . vitamin B-12 (CYANOCOBALAMIN) 1000 MCG tablet Take 1,000 mcg by mouth daily.     No current facility-administered medications on file prior to visit.    Allergies  Allergen Reactions  . Niacin And Related Rash    Red face and burns.    Past Medical History  Diagnosis Date  . Coronary artery disease   . Hyperlipidemia   . Hypertension   . Obesity   . Carotid arterial disease ([place])   . Diabetes mellitus   . Torn rotator cuff 2012     Right arm  . Osteoarthritis     Left wrist  . Heart murmur   . Dyspnea     with exertion  . HOH (hard of hearing)   . Frequency of urination   . Bruises easily     Past Surgical History  Procedure Laterality Date  . Mole surgery    . Coronary artery bypass graft  03/2003    X3. LIMA GRAFT TO THE LAD, SAPHENOUS VEIN GRAFT TO THE PA, AND A LEFT RADIAL GRAFT TO THEOBTUSE MARGINAL VESSEL  . Cardiac catheterization  11/29/2005    SEVERE THREE VESSEL OBSTRUCTIVE ATHERSCLEROTIC CAD. ALL GRAFTS WERE PATENT. EF 65%  . Back surgery      X2  . Endarterectomy Left 05/28/2012    Procedure: ENDARTERECTOMY  CAROTID;  Surgeon: Elam Dutch, MD;  Location: [facility];  Service: Vascular;  Laterality: Left;  . Patch angioplasty Left 05/28/2012    Procedure: WITH dACRON PATCH ANGIOPLASTY;  Surgeon: Elam Dutch, MD;  Location: [facility] OR;  Service: Vascular;  Laterality: Left;  . Carotid endarterectomy Left 05-28-12    cea  . Carotid endarterectomy Right 01-29-08    cea    History  Smoking status  . Former Smoker  . Quit date: 01/24/1994  Smokeless tobacco  . Former User    History  Alcohol Use No    Family History  Problem Relation Age of Onset  . Heart attack Father   . Heart attack Brother   . Parkinsonism Brother   . Cancer Brother   . Arrhythmia Brother     Review of Systems: As noted in HPI. All other systems were reviewed and are negative.  Physical Exam: BP 132/64 mmHg  Pulse  54  Ht 6' (1.829 m)  Wt 120.657 kg (266 lb)  BMI 36.07 kg/m2 He is an obese white male in no acute distress. He is normocephalic, atraumatic. Pupils equal round react to light accommodation. Extraocular movements are full. Oropharynx is clear. Neck is without JVD, adenopathy, thyromegaly, or bruits. Lungs reveal scattered wheezes. Cardiac exam reveals an IRR and grade 2/6 systolic ejection murmur the right upper sternal border radiating to the left sternal border. There is no diastolic murmur or S3. Abdomen is obese, soft, nontender. He has no masses or bruits. He has no edema. Femoral and pedal pulses are good.  LABORATORY DATA: Lab Results  Component Value Date   WBC 6.4 02/20/2013   HGB 13.3 02/20/2013   HCT 41.3 02/20/2013   PLT 179.0 02/20/2013   GLUCOSE 116* 02/20/2013   CHOL 176 02/20/2013   TRIG 105.0 02/20/2013   HDL 38.60* 02/20/2013   LDLCALC 116* 02/20/2013   ALT 21 02/20/2013   AST 25 02/20/2013   NA 136 02/20/2013   K 4.2 02/20/2013   CL 102 02/20/2013   CREATININE 1.2 02/20/2013   BUN 17 02/20/2013   CO2 27 02/20/2013   INR 0.96 05/24/2012   HGBA1C 6.2* 05/29/2012   Ecg today shows AFib with slow ventricular response. Rate 54. LAFB and RBBB.  Assessment / Plan: 1. Coronary disease status post CABG in 2005. He has stable class I-2 anginal symptoms. His Myoview study 2013 showed no significant ischemia. We'll continue with his medical therapy including aspirin, metoprolol, and ACE inhibitor. Intolerant to statins.  2. Hyperlipidemia, intolerant to statins. Now on Zetia.   3. Diabetes mellitus type 2. He will continue metformin. Managed by primary care.   4. Hypertension - well controlled.   5. History of Trifascicular block.  On no rate slowing meds. Now with AFib and slow ventricular response. I am concerned that he has symptoms related to his bradycardia with chronotropic incompetence. Will have him wear a 24 hour Holter to evaluate further. May need to consider  for a pacemaker.   6. Carotid arterial disease status post left endarterectomy. Carotid dopplers in Dec 2015 showed less than 50% stenosis. 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  7. Atrial fibrillation- now permanent with slow rate.  Continue Xarelto 20 mg daily. Avoid NSAIDs.  On no rate slowing medication due to bradycardia.   8. Edema. None seen on exam today. I think this is mostly related to salt intake and keeping feet down more at night. Recommend sodium restriction and elevation of feet. If  problem persists consider support hose.

## 2015-03-20 NOTE — Patient Instructions (Signed)
We will have you wear a monitor to see how your heart rate does.'

## 2015-03-26 ENCOUNTER — Telehealth: Payer: Self-pay

## 2015-03-26 NOTE — Telephone Encounter (Signed)
Dr.Jordan reviewed monitor.Advised continue same medications.Keep previous appointment 04/15/15.

## 2015-03-26 NOTE — Telephone Encounter (Signed)
Call from [place] pt completed 24 Holter Monitor during time she had 54 pauses with the longest being 3.3 seconds on day 2. Pt report being faxed for MD review.

## 2015-03-30 ENCOUNTER — Telehealth: Payer: Self-pay | Admitting: Cardiology

## 2015-03-30 NOTE — Telephone Encounter (Signed)
He also wants to know if he needs to keep his appointment on 04-15-15?

## 2015-03-30 NOTE — Telephone Encounter (Signed)
Returned call to patient's wife.Monitor results still in process.Advised to keep appointment with Dr.Jordan 04/15/15 at 3:30 pm.

## 2015-03-30 NOTE — Telephone Encounter (Signed)
Pt would like his monitor results please. °

## 2015-04-10 ENCOUNTER — Telehealth: Payer: Self-pay | Admitting: Cardiology

## 2015-04-10 MED ORDER — RIVAROXABAN 20 MG PO TABS: 20.0000 mg | ORAL_TABLET | Freq: Every day | ORAL | Status: DC

## 2015-04-10 NOTE — Telephone Encounter (Signed)
Patient calling the office for samples of medication:   1.  What medication and dosage are you requesting samples for? Xarelto-wife says pt is on his way up here right now  2.  Are you currently out of this medication?no

## 2015-04-10 NOTE — Telephone Encounter (Signed)
Samples at desk for patient.

## 2015-04-15 ENCOUNTER — Ambulatory Visit: Payer: Medicare Other | Admitting: Cardiology

## 2015-04-22 ENCOUNTER — Telehealth: Payer: Self-pay | Admitting: Cardiology

## 2015-04-22 NOTE — Telephone Encounter (Signed)
Pt's wife called in for 2 reasons,   (1) She says that yesterday the pt scrapped his leg pretty bad and she says that he is borderline diabetic and she is concerned about what he should do to aid his wound.    (2)Patient calling the office for samples of medication:   1.  What medication and dosage are you requesting samples for? Xarelto 20mg   2.  Are you currently out of this medication? Yes

## 2015-04-22 NOTE — Telephone Encounter (Signed)
Spoke with pt he was splitting wood and it kept hitting him in leg but he is fine. Pt advised that if it is open wound to keep it clean and if it is really bad to call his PCP to have it evaluated. Pt verbalized understanding with no additional questions.  Pt also advised that samples are left up from for pick-up.

## 2015-06-08 ENCOUNTER — Telehealth: Payer: Self-pay | Admitting: Cardiology

## 2015-06-08 NOTE — Telephone Encounter (Signed)
Spoke with wife, ok per DPR.  Asked Erasmo Downer, PharmD, to review pt med list and if pt could take imodium. She advised this would be ok. Pt's wife verbalized understanding.

## 2015-06-08 NOTE — Telephone Encounter (Signed)
New message      Pt is out of town and has diarrhea.  Can he take imodium?

## 2015-06-23 ENCOUNTER — Telehealth: Payer: Self-pay | Admitting: Cardiology

## 2015-06-23 NOTE — Telephone Encounter (Signed)
Returned call to patient's wife Xarelto 20 mg samples left at [employer] [facility] [employer] office front desk.Follow up appointment scheduled with Dr.Jordan 10/22/15 at 9:15 am.

## 2015-06-23 NOTE — Telephone Encounter (Signed)
Patient calling the office for samples of medication:   1.  What medication and dosage are you requesting samples for? Xarelto  2.  Are you currently out of this medication? 3 pills left   

## 2015-07-23 ENCOUNTER — Telehealth: Payer: Self-pay | Admitting: Cardiology

## 2015-07-23 NOTE — Telephone Encounter (Signed)
Patient calling the office for samples of medication:   1.  What medication and dosage are you requesting samples for? Xarelto 2. Are you currently out of this medication? No,just a few left

## 2015-07-24 NOTE — Telephone Encounter (Signed)
Returned call to patient, pt having a bad connection with home pt unable to hear.Left message on wife's cell phone xarelto 20 mg samples left at front desk of [facility] office.

## 2015-07-25 DIAGNOSIS — I639 Cerebral infarction, unspecified: Secondary | ICD-10-CM | POA: Insufficient documentation

## 2015-07-25 HISTORY — DX: Cerebral infarction, unspecified: I63.9

## 2015-08-13 ENCOUNTER — Telehealth: Payer: Self-pay | Admitting: Cardiology

## 2015-08-13 NOTE — Telephone Encounter (Signed)
Returned call to patient's wife.She stated she wanted Dr.Jordan to know husband woke up this past Monday with pressure behind eyes,clear sinus drainage,chills,no fever.Stated he has been taking claritin and just switched to zrytec.Stated he continues to feel bad this morning.No cough.No sob.No chest pain.Advised to see PCP or go to a Urgent Care.

## 2015-08-13 NOTE — Telephone Encounter (Signed)
Please call,pt is not feeling well.

## 2015-08-24 ENCOUNTER — Emergency Department (HOSPITAL_COMMUNITY): Payer: Medicare Other

## 2015-08-24 ENCOUNTER — Inpatient Hospital Stay (HOSPITAL_COMMUNITY)
Admission: EM | Admit: 2015-08-24 | Discharge: 2015-08-26 | DRG: 066 | Disposition: A | Discharge status: Home / Self Care | Payer: Medicare Other | Attending: Internal Medicine | Admitting: Internal Medicine

## 2015-08-24 ENCOUNTER — Encounter (HOSPITAL_COMMUNITY): Payer: Self-pay | Admitting: Emergency Medicine

## 2015-08-24 DIAGNOSIS — E119 Type 2 diabetes mellitus without complications: Secondary | ICD-10-CM

## 2015-08-24 DIAGNOSIS — I160 Hypertensive urgency: Secondary | ICD-10-CM | POA: Diagnosis present

## 2015-08-24 DIAGNOSIS — R197 Diarrhea, unspecified: Secondary | ICD-10-CM | POA: Diagnosis present

## 2015-08-24 DIAGNOSIS — E785 Hyperlipidemia, unspecified: Secondary | ICD-10-CM | POA: Diagnosis present

## 2015-08-24 DIAGNOSIS — I63431 Cerebral infarction due to embolism of right posterior cerebral artery: Principal | ICD-10-CM

## 2015-08-24 DIAGNOSIS — I4891 Unspecified atrial fibrillation: Secondary | ICD-10-CM | POA: Diagnosis present

## 2015-08-24 DIAGNOSIS — I639 Cerebral infarction, unspecified: Secondary | ICD-10-CM

## 2015-08-24 DIAGNOSIS — I2510 Atherosclerotic heart disease of native coronary artery without angina pectoris: Secondary | ICD-10-CM | POA: Diagnosis present

## 2015-08-24 DIAGNOSIS — I482 Chronic atrial fibrillation: Secondary | ICD-10-CM | POA: Diagnosis not present

## 2015-08-24 DIAGNOSIS — N400 Enlarged prostate without lower urinary tract symptoms: Secondary | ICD-10-CM | POA: Diagnosis present

## 2015-08-24 DIAGNOSIS — H9190 Unspecified hearing loss, unspecified ear: Secondary | ICD-10-CM | POA: Diagnosis present

## 2015-08-24 DIAGNOSIS — M1990 Unspecified osteoarthritis, unspecified site: Secondary | ICD-10-CM | POA: Diagnosis present

## 2015-08-24 DIAGNOSIS — I63531 Cerebral infarction due to unspecified occlusion or stenosis of right posterior cerebral artery: Secondary | ICD-10-CM | POA: Diagnosis present

## 2015-08-24 DIAGNOSIS — Z6834 Body mass index (BMI) 34.0-34.9, adult: Secondary | ICD-10-CM

## 2015-08-24 DIAGNOSIS — E669 Obesity, unspecified: Secondary | ICD-10-CM | POA: Diagnosis present

## 2015-08-24 DIAGNOSIS — I10 Essential (primary) hypertension: Secondary | ICD-10-CM | POA: Diagnosis present

## 2015-08-24 DIAGNOSIS — Z951 Presence of aortocoronary bypass graft: Secondary | ICD-10-CM

## 2015-08-24 DIAGNOSIS — I739 Peripheral vascular disease, unspecified: Secondary | ICD-10-CM

## 2015-08-24 DIAGNOSIS — Z7901 Long term (current) use of anticoagulants: Secondary | ICD-10-CM

## 2015-08-24 DIAGNOSIS — Z87891 Personal history of nicotine dependence: Secondary | ICD-10-CM

## 2015-08-24 DIAGNOSIS — H538 Other visual disturbances: Secondary | ICD-10-CM | POA: Diagnosis not present

## 2015-08-24 DIAGNOSIS — I779 Disorder of arteries and arterioles, unspecified: Secondary | ICD-10-CM | POA: Diagnosis present

## 2015-08-24 LAB — CBC WITH DIFFERENTIAL/PLATELET
BASOS ABS: 0.1 10*3/uL (ref 0.0–0.1)
BASOS PCT: 1 %
EOS PCT: 3 %
Eosinophils Absolute: 0.2 10*3/uL (ref 0.0–0.7)
HEMATOCRIT: 43.8 % (ref 39.0–52.0)
HEMOGLOBIN: 14.5 g/dL (ref 13.0–17.0)
LYMPHS PCT: 29 %
Lymphs Abs: 2 10*3/uL (ref 0.7–4.0)
MCH: 31.5 pg (ref 26.0–34.0)
MCHC: 33.1 g/dL (ref 30.0–36.0)
MCV: 95.2 fL (ref 78.0–100.0)
MONOS PCT: 6 %
Monocytes Absolute: 0.4 10*3/uL (ref 0.1–1.0)
NEUTROS ABS: 4.1 10*3/uL (ref 1.7–7.7)
Neutrophils Relative %: 61 %
Platelets: 203 10*3/uL (ref 150–400)
RBC: 4.6 MIL/uL (ref 4.22–5.81)
RDW: 13.6 % (ref 11.5–15.5)
WBC: 6.8 10*3/uL (ref 4.0–10.5)

## 2015-08-24 LAB — COMPREHENSIVE METABOLIC PANEL
ALBUMIN: 4.3 g/dL (ref 3.5–5.0)
ALK PHOS: 76 U/L (ref 38–126)
ALT: 24 U/L (ref 17–63)
AST: 27 U/L (ref 15–41)
Anion gap: 10 (ref 5–15)
BUN: 16 mg/dL (ref 6–20)
CALCIUM: 9.8 mg/dL (ref 8.9–10.3)
CO2: 23 mmol/L (ref 22–32)
CREATININE: 1.05 mg/dL (ref 0.61–1.24)
Chloride: 105 mmol/L (ref 101–111)
GFR calc Af Amer: >60 mL/min (ref >60)
GFR calc non Af Amer: >60 mL/min (ref >60)
GLUCOSE: 122 mg/dL — AB (ref 65–99)
Potassium: 4 mmol/L (ref 3.5–5.1)
SODIUM: 138 mmol/L (ref 135–145)
Total Bilirubin: 0.4 mg/dL (ref 0.3–1.2)
Total Protein: 7.2 g/dL (ref 6.5–8.1)

## 2015-08-24 LAB — I-STAT TROPONIN, ED: Troponin i, poc: 0 ng/mL (ref 0.00–0.08)

## 2015-08-24 LAB — PROTIME-INR
INR: 1.2
Prothrombin Time: 15.3 seconds — ABNORMAL HIGH (ref 11.4–15.2)

## 2015-08-24 LAB — CBG MONITORING, ED
Glucose-Capillary: 128 mg/dL — ABNORMAL HIGH (ref 65–99)
Glucose-Capillary: 100 mg/dL — ABNORMAL HIGH (ref 65–99)

## 2015-08-24 LAB — GLUCOSE, CAPILLARY: Glucose-Capillary: 131 mg/dL — ABNORMAL HIGH (ref 65–99)

## 2015-08-24 MED ORDER — VITAMIN B-12 1000 MCG PO TABS
1000.0000 ug | ORAL_TABLET | Freq: Every day | ORAL | Status: DC
  Administered 2015-08-25 – 2015-08-26 (×2): 1000 ug via ORAL
  Filled 2015-08-24 (×2): qty 1

## 2015-08-24 MED ORDER — LABETALOL HCL 5 MG/ML IV SOLN
10.0000 mg | INTRAVENOUS | Status: DC | PRN
  Administered 2015-08-24: 10 mg via INTRAVENOUS
  Filled 2015-08-24: qty 4

## 2015-08-24 MED ORDER — ACETAMINOPHEN 325 MG PO TABS: 650.0000 mg | ORAL_TABLET | Freq: Four times a day (QID) | ORAL | Status: DC | PRN

## 2015-08-24 MED ORDER — SENNOSIDES-DOCUSATE SODIUM 8.6-50 MG PO TABS
1.0000 | ORAL_TABLET | Freq: Every evening | ORAL | Status: DC | PRN
  Filled 2015-08-24: qty 1

## 2015-08-24 MED ORDER — LABETALOL HCL 5 MG/ML IV SOLN: 10.0000 mg | INTRAVENOUS | Status: DC | PRN

## 2015-08-24 MED ORDER — INSULIN ASPART 100 UNIT/ML ~~LOC~~ SOLN
0.0000 [IU] | Freq: Every day | SUBCUTANEOUS | Status: DC
Start: 2015-08-24 — End: 2015-08-26

## 2015-08-24 MED ORDER — BENAZEPRIL HCL 10 MG PO TABS
40.0000 mg | ORAL_TABLET | Freq: Every day | ORAL | Status: DC
  Administered 2015-08-25 – 2015-08-26 (×2): 40 mg via ORAL
  Filled 2015-08-24 (×2): qty 4

## 2015-08-24 MED ORDER — HYDROCORTISONE 1 % EX CREA: TOPICAL_CREAM | Freq: Every day | CUTANEOUS | Status: DC | PRN

## 2015-08-24 MED ORDER — EZETIMIBE 10 MG PO TABS
10.0000 mg | ORAL_TABLET | Freq: Every evening | ORAL | Status: DC
  Administered 2015-08-24 – 2015-08-25 (×2): 10 mg via ORAL
  Filled 2015-08-24 (×3): qty 1

## 2015-08-24 MED ORDER — HYDROCHLOROTHIAZIDE 25 MG PO TABS
25.0000 mg | ORAL_TABLET | Freq: Every morning | ORAL | Status: DC
  Administered 2015-08-25 – 2015-08-26 (×2): 25 mg via ORAL
  Filled 2015-08-24 (×2): qty 1

## 2015-08-24 MED ORDER — SODIUM CHLORIDE 0.9 % IV SOLN
INTRAVENOUS | Status: DC
  Administered 2015-08-24: 1000 mL via INTRAVENOUS

## 2015-08-24 MED ORDER — LABETALOL HCL 5 MG/ML IV SOLN
10.0000 mg | Freq: Once | INTRAVENOUS | Status: AC
  Administered 2015-08-24: 10 mg via INTRAVENOUS
  Filled 2015-08-24: qty 4

## 2015-08-24 MED ORDER — INSULIN ASPART 100 UNIT/ML ~~LOC~~ SOLN
0.0000 [IU] | Freq: Three times a day (TID) | SUBCUTANEOUS | Status: DC
Start: 2015-08-25 — End: 2015-08-26
  Administered 2015-08-25 – 2015-08-26 (×3): 2 [IU] via SUBCUTANEOUS

## 2015-08-24 MED ORDER — TAMSULOSIN HCL 0.4 MG PO CAPS
0.4000 mg | ORAL_CAPSULE | Freq: Every day | ORAL | Status: DC
  Administered 2015-08-25: 0.4 mg via ORAL
  Filled 2015-08-24 (×2): qty 1

## 2015-08-24 MED ORDER — STROKE: EARLY STAGES OF RECOVERY BOOK
Freq: Once | Status: DC
  Filled 2015-08-24: qty 1

## 2015-08-24 MED ORDER — NICARDIPINE HCL IN NACL 20-0.86 MG/200ML-% IV SOLN: 3.0000 mg/h | Freq: Once | INTRAVENOUS | Status: DC

## 2015-08-24 MED ORDER — GADOBENATE DIMEGLUMINE 529 MG/ML IV SOLN
20.0000 mL | Freq: Once | INTRAVENOUS | Status: AC | PRN
  Administered 2015-08-24: 20 mL via INTRAVENOUS

## 2015-08-24 MED ORDER — CYANOCOBALAMIN ER 1000 MCG PO TBCR: 1.0000 | EXTENDED_RELEASE_TABLET | Freq: Every day | ORAL | Status: DC

## 2015-08-24 NOTE — ED Triage Notes (Signed)
Per pt wife- pt has had two weeks of sinus issues with blurred vision and sinus issues. Pt was seen by MD and was treated with penicillin and mucinex. Pt eyes were also having drainage. Pt also reports he had a headache two weeks ago when this started. No headache currently. Today pt was seen by his eye doctor who told him to come to the ED to rule out stroke. Pt also reports diarrhea. Pt reports he thought it could have been his diabetes causing the visual issues, so he started taking his metformin again yesterday without informing his PCP. Pt reports cbg was 119 earlier today.

## 2015-08-24 NOTE — Consult Note (Signed)
Neurology Consultation Reason for Consult: Hemorrhagic infarct Referring Physician: Rudean Haskell  CC: Vision problems  History is obtained from: Patient, wife  HPI: Evan [name] is a 75 y.o. male reports that he began having problems with his vision 2 weeks ago. States that he had a headache at that time. He went to see an eye doctor today he was concerned that he may have had a stroke and therefore referred him to the emergency department. In the ER, he had a head CT which demonstrates hemorrhagic infarction.  He does take rivaroxaban, but his last dose was more than 24 hours ago.  LKW: 2 weeks ago tpa given?: no, out of window, hemorrhagic conversion   ROS: A 14 point ROS was performed and is negative except as noted in the HPI.   Past Medical History:  Diagnosis Date  . Bruises easily   . 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 Coronary artery disease   . Diabetes mellitus   . Dyspnea    with exertion  . Frequency of urination   . Heart murmur   . HOH (hard of hearing)   . Hyperlipidemia   . Hypertension   . Obesity   . Osteoarthritis    Left wrist  . Torn rotator cuff 2012    Right arm     Family History  Problem Relation Age of Onset  . Heart attack Father   . Heart attack Brother   . Parkinsonism Brother   . Cancer Brother   . Arrhythmia Brother      Social History:  reports that he quit smoking about 21 years ago. He has quit using smokeless tobacco. He reports that he does not drink alcohol or use drugs.   Exam: Current vital signs: BP (!) 201/80   Pulse 65   Temp 97.5 F (36.4 C)   Resp 12   Ht 6' (1.829 m)   Wt 116.6 kg (257 lb)   SpO2 96%   BMI 34.86 kg/m  Vital signs in last 24 hours: Temp:  [97.5 F (36.4 C)] 97.5 F (36.4 C) (07/31 1736) Pulse Rate:  [43-67] 65 (07/31 1945) Resp:  [12-19] 12 (07/31 1945) BP: (176-215)/(70-90) 201/80 (07/31 1945) SpO2:  [96 %-100 %] 96 % (07/31 1945) Weight:  [116.6 kg (257 lb)] 116.6 kg (257 lb) (07/31  1736)   Physical Exam  Constitutional: Appears well-developed and well-nourished.  Psych: Affect appropriate to situation Eyes: No scleral injection HENT: No OP obstrucion Head: Normocephalic.  Cardiovascular: Normal rate and regular rhythm.  Respiratory: Effort normal and breath sounds normal to anterior ascultation GI: Soft.  No distension. There is no tenderness.  Skin: WDI  Neuro: Mental Status: Patient is awake, alert, oriented to person, place, month, and situation. He is unable to give year Patient is able to give a clear and coherent history. No signs of aphasia or neglect Cranial Nerves: II: Visual Fields are full. He has a left upper visual field cut Pupils are equal, round, and reactive to light.  He does not extinguish to double simultaneous stimulation to visual stimuli of the lower fields III,IV, VI: EOMI without ptosis or diploplia.  V: Facial sensation is symmetric to temperature VII: Facial movement is symmetric.  VIII: hearing is intact to voice X: Uvula elevates symmetrically XI: Shoulder shrug is symmetric. XII: tongue is midline without atrophy or fasciculations.  Motor: Tone is normal. Bulk is normal. 5/5 strength was present in all four extremities.  Sensory: Sensation is symmetric to  light touch and temperature in the arms and legs. He does not have any extinction to double simultaneous stimulation Cerebellar: FNF intact bilaterally  I have reviewed labs in epic and the results pertinent to this consultation are: CMP-unremarkable  I have reviewed the images obtained: MRI brain-hemorrhagic infarction on the right  Impression: 75 year old male who likely had an ischemic stroke 2 weeks ago with subsequent hemorrhagic inversion due to anticoagulation. Now that we are this far out, he will need blood pressure control and I would try to obtain a goal of less than [id number] systolic.   Recommendations: 1. HgbA1c, fasting lipid panel 2. CT angiogram of the head  and neck 3. Frequent neuro checks 4. Echocardiogram 5. Blood pressure control with goal systolic less than 99991111 6. Prophylactic therapy-none for now, pending repeat CT tomorrow 7. Risk factor modification 8. Telemetry monitoring 9. PT consult, OT consult, Speech consult 10. please page stroke NP  Or  PA  Or MD  from 8am -4 pm starting 8/01 as this patient will be followed by the stroke team at this point.   You can look them up on [email or web address]      Roland Rack, MD [facility] [phone]  If 7pm- 7am, please page neurology on call as listed in [place].

## 2015-08-24 NOTE — ED Provider Notes (Signed)
Evan [name] DEPT Provider Note   CSN: [id number] Arrival date & time: 08/24/15  1724  First Provider Contact:  First MD Initiated Contact with Patient 08/24/15 1825        History   Chief Complaint Chief Complaint  Patient presents with  . Stroke Symptoms  . Blurred Vision    HPI Evan [name] is a 75 y.o. male.  The history is provided by the patient. No language interpreter was used.   Evan [name] is a 75 y.o. male who presents to the Emergency Department complaining of blurred vision. He was sent to the emergency department for evaluation of stroke after seeing his eye doctor. He reports difficulty seeing since Monday and a light headache on Tuesday. He has no other complaints. He does take a blood thinner but he is not sure why. Symptoms are moderate and constant. He denies any numbness, weakness, chest pain or shortness of breath, nausea, vomiting.  Past Medical History:  Diagnosis Date  . Bruises easily   . 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 Coronary artery disease   . Diabetes mellitus   . Dyspnea    with exertion  . Frequency of urination   . Heart murmur   . HOH (hard of hearing)   . Hyperlipidemia   . Hypertension   . Obesity   . Osteoarthritis    Left wrist  . Torn rotator cuff 2012    Right arm    Patient Active Problem List   Diagnosis Date Noted  . CVA (cerebral infarction) 08/24/2015  . Diarrhea 08/24/2015  . Edema of leg 03/20/2015  . Bradycardia 03/20/2015  . Nocturia 10/24/2014  . Atrial fibrillation ([place]) 02/20/2013  . Aftercare following surgery of the circulatory system, [place] 06/07/2012  . Occlusion and stenosis of carotid artery without mention of cerebral infarction 04/14/2011  . Shoulder pain 04/14/2011  . 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 Diabetes mellitus ([place]) 02/01/2011  . Coronary artery disease   . Hyperlipidemia   . Hypertensive urgency   . Obesity   . Carotid arterial  disease ([place])     Past Surgical History:  Procedure Laterality Date  . BACK SURGERY     X2  . CARDIAC CATHETERIZATION  11/29/2005   SEVERE THREE VESSEL OBSTRUCTIVE ATHERSCLEROTIC CAD. ALL GRAFTS WERE PATENT. EF 65%  . CAROTID ENDARTERECTOMY Left 05-28-12   cea  . CAROTID ENDARTERECTOMY Right 01-29-08   cea  . CORONARY ARTERY BYPASS GRAFT  03/2003   X3. LIMA GRAFT TO THE LAD, SAPHENOUS VEIN GRAFT TO THE PA, AND A LEFT RADIAL GRAFT TO THEOBTUSE MARGINAL VESSEL  . ENDARTERECTOMY Left 05/28/2012   Procedure: ENDARTERECTOMY CAROTID;  Surgeon: Elam Dutch, MD;  Location: [facility];  Service: Vascular;  Laterality: Left;  . MOLE SURGERY    . PATCH ANGIOPLASTY Left 05/28/2012   Procedure: WITH dACRON PATCH ANGIOPLASTY;  Surgeon: Elam Dutch, MD;  Location: [facility] OR;  Service: Vascular;  Laterality: Left;       Home Medications    Prior to Admission medications   Medication Sig Start Date End Date Taking? Authorizing Provider  acetaminophen (TYLENOL) 500 MG tablet Take 500-1,000 mg by mouth every 6 (six) hours as needed for mild pain or moderate pain.   Yes Historical Provider, MD  BAYER CONTOUR NEXT TEST test strip As directed 07/06/15  Yes Historical Provider, MD  benazepril (LOTENSIN) 40 MG tablet Take 1 tablet (40 mg total) by mouth  daily. 02/11/15  Yes [name] M [name], MD  Cyanocobalamin (RA VITAMIN B-12 TR) 1000 MCG TBCR Take 1 tablet by mouth daily.   Yes Historical Provider, MD  ezetimibe (ZETIA) 10 MG tablet Take 1 tablet (10 mg total) by mouth daily. Patient taking differently: Take 10 mg by mouth every evening.  02/19/15  Yes [name] M [name], MD  hydrochlorothiazide (HYDRODIURIL) 25 MG tablet Take 25 mg by mouth every morning.    Yes Historical Provider, MD  hydrocortisone 2.5 % cream Apply topically. As directed 06/16/15 06/15/16 Yes Historical Provider, MD  LANCETS ULTRA FINE MISC As directed 07/06/15 07/05/16 Yes Historical Provider, MD  Dell Replenex tablets: Take  1 tablet by mouth every morning (joint health)   Yes Historical Provider, MD  rivaroxaban (XARELTO) 20 MG TABS tablet Take 1 tablet (20 mg total) by mouth daily with supper. 04/10/15  Yes [name] M [name], MD  tamsulosin (FLOMAX) 0.4 MG CAPS capsule Take 1 capsule (0.4 mg total) by mouth daily after supper. 01/23/15  Yes [name] M [name], MD  vitamin B-12 (CYANOCOBALAMIN) 1000 MCG tablet Take 1,000 mcg by mouth daily.    Historical Provider, MD    Family History Family History  Problem Relation Age of Onset  . Heart attack Father   . Heart attack Brother   . Parkinsonism Brother   . Cancer Brother   . Arrhythmia Brother     Social History Social History  Substance Use Topics  . Smoking status: Former Smoker    Quit date: 01/24/1994  . Smokeless tobacco: Former Systems developer  . Alcohol use No     Allergies   Niacin and related   Review of Systems Review of Systems  All other systems reviewed and are negative.    Physical Exam Updated Vital Signs BP 174/92   Pulse (!) 57   Temp 97.7 F (36.5 C)   Resp 18   Ht 6' (1.829 m)   Wt 257 lb (116.6 kg)   SpO2 96%   BMI 34.86 kg/m   Physical Exam  Constitutional: He appears well-developed and well-nourished.  HENT:  Head: Normocephalic and atraumatic.  Eyes: EOM are normal. Pupils are equal, round, and reactive to light.  Cardiovascular: Normal rate and regular rhythm.   No murmur heard. Pulmonary/Chest: Effort normal and breath sounds normal. No respiratory distress.  Abdominal: Soft. There is no tenderness. There is no rebound and no guarding.  Musculoskeletal: He exhibits no edema or tenderness.  Neurological: He is alert.  Disoriented to time. No facial asymmetry. Sensation to light touch intact in all 4 extremities. 5 out of 5 strength in all 4 extremities.  Skin: Skin is warm and dry.  Psychiatric: He has a normal mood and affect. His behavior is normal.  Nursing note and vitals reviewed.    ED Treatments / Results    Labs (all labs ordered are listed, but only abnormal results are displayed) Labs Reviewed  COMPREHENSIVE METABOLIC PANEL - Abnormal; Notable for the following:       Result Value   Glucose, Bld 122 (*)    All other components within normal limits  PROTIME-INR - Abnormal; Notable for the following:    Prothrombin Time 15.3 (*)    All other components within normal limits  GLUCOSE, CAPILLARY - Abnormal; Notable for the following:    Glucose-Capillary 131 (*)    All other components within normal limits  CBG MONITORING, ED - Abnormal; Notable for the following:    Glucose-Capillary  128 (*)    All other components within normal limits  CBG MONITORING, ED - Abnormal; Notable for the following:    Glucose-Capillary 100 (*)    All other components within normal limits  URINE CULTURE  CBC WITH DIFFERENTIAL/PLATELET  URINALYSIS, ROUTINE W REFLEX MICROSCOPIC (NOT AT [facility])  HEMOGLOBIN A1C  LIPID PANEL  I-STAT TROPOININ, ED    EKG  EKG Interpretation  Date/Time:  Monday August 24 2015 17:33:30 EDT Ventricular Rate:  62 PR Interval:    QRS Duration: 172 QT Interval:  448 QTC Calculation: [id number] R Axis:   -90 Text Interpretation  Atrial fibrillation Right bundle branch block Left anterior fascicular block  Bifascicular block  Abnormal ECG Confirmed by Hazle Coca [id number]) on 08/24/2015 6:34:46 PM       Radiology Ct Head Wo Contrast  Result Date: 08/24/2015 CLINICAL DATA:  Stroke symptoms.  Blurry vision. EXAM: CT HEAD WITHOUT CONTRAST TECHNIQUE: Contiguous axial images were obtained from the base of the skull through the vertex without intravenous contrast. COMPARISON:  None. FINDINGS: Ill-defined hypodensity in the right periventricular temporal occipital lobe with internal ill-defined hemorrhagic components measuring 3.2 x 1.5 x 1.6 cm. Edema and hemorrhage about the right tentorium without midline shift. There is mass effect on the occipital horn of the right lateral ventricle without  definite intraventricular hemorrhage component. No hydrocephalus, ventricles are normal for age. Basilar cisterns are patent. There is atherosclerosis of skullbase vasculature. The calvarium is intact. Included paranasal sinuses are well aerated. Scattered opacification of lower left mastoid air cells. Small calcified sebaceous cyst in the left frontal scalp. IMPRESSION: Hemorrhage with surrounding edema versus hemorrhagic mass involving the right occipital lobe. This causes mass effect on the right lateral ventricle but no midline shift. Recommend MRI without and with contrast for further characterization. Critical Value/emergent results were called by telephone at the time of interpretation on 08/24/2015 at 6:26 pm to Dr. Quintella Reichert , who verbally acknowledged these results. Electronically Signed   By: Jeb Levering M.D.   On: 08/24/2015 18:29   Mr Brain W Or Wo Contrast  Result Date: 08/24/2015 CLINICAL DATA:  Headaches and blurred vision over the last 2 weeks. Abnormal eye exam and head CT. EXAM: MRI HEAD WITHOUT AND WITH CONTRAST TECHNIQUE: Multiplanar, multiecho pulse sequences of the brain and surrounding structures were obtained without and with intravenous contrast. CONTRAST:  32mL MULTIHANCE GADOBENATE DIMEGLUMINE 529 MG/ML IV SOLN COMPARISON:  Head CT earlier same day. FINDINGS: There is subacute infarction in the right PCA territory affecting the posteromedial temporal lobe and occipital lobe, with considerable hemorrhagic transformation. Minor involvement of the lateral thalamus. This is consistent with the clinical history of 2 weeks duration of symptoms. No other acute infarction. The brainstem is normal. The cerebellum is normal. Cerebral hemispheres otherwise show mild chronic small-vessel ischemic change of the deep and subcortical white matter. No mass lesion. Mild edema in the region of the infarction with mild mass effect but no midline shift. No extra-axial collection. After contrast  administration, there is gyriform enhancement in the region of infarction, also consistent with the subacute time frame. No hydrocephalus. No extra-axial fluid collection. No evidence of neoplastic mass lesion. Pituitary gland is normal. Sinuses are clear. IMPRESSION: Subacute infarction in the right PCA territory with hemorrhagic transformation and mild edema. Findings are consistent with the clinical time frame of 2 weeks ago. Electronically Signed   By: Nelson Chimes M.D.   On: 08/24/2015 21:00    Procedures Procedures (including critical care  time)  Medications Ordered in ED Medications  labetalol (NORMODYNE,TRANDATE) injection 10 mg (10 mg Intravenous Given 08/24/15 2352)  acetaminophen (TYLENOL) tablet 650 mg (not administered)  hydrocortisone cream 1 % (not administered)  ezetimibe (ZETIA) tablet 10 mg (10 mg Oral Given 08/24/15 2356)  benazepril (LOTENSIN) tablet 40 mg (not administered)  tamsulosin (FLOMAX) capsule 0.4 mg (not administered)  vitamin B-12 (CYANOCOBALAMIN) tablet 1,000 mcg (not administered)  hydrochlorothiazide (HYDRODIURIL) tablet 25 mg (not administered)  insulin aspart (novoLOG) injection 0-9 Units (not administered)  insulin aspart (novoLOG) injection 0-5 Units (not administered)   stroke: mapping our early stages of recovery book (not administered)  0.9 %  sodium chloride infusion (1,000 mLs Intravenous New Bag/Given 08/24/15 2357)  senna-docusate (Senokot-S) tablet 1 tablet (not administered)  gadobenate dimeglumine (MULTIHANCE) injection 20 mL (20 mLs Intravenous Contrast Given 08/24/15 2053)  labetalol (NORMODYNE,TRANDATE) injection 10 mg (10 mg Intravenous Given 08/24/15 2142)     Initial Impression / Assessment and Plan / ED Course  I have reviewed the triage vital signs and the nursing notes.  Pertinent labs & imaging results that were available during my care of the patient were reviewed by me and considered in my medical decision making (see chart for  details).  Clinical Course   Pt with hx/o afib here with vision changes sent by Optho for eval of possible CVA.  CT scan with occipital hemorrhage.  MRI c/w infarct with hemorrhagic transformation.  Nicardipine drip ordered but not given due to patient being in the CT scanner.  On return from MRI pt with improved BP, given IV labetalol for hypertension.  Neurology consulted regarding CVA.  Hospitalist consulted for admission.     Final Clinical Impressions(s) / ED Diagnoses   Final diagnoses:  Stroke (cerebrum) ([place])  Stroke (cerebrum) [facility])    New Prescriptions Current Discharge Medication List       Quintella Reichert, MD 08/25/15 [phone]

## 2015-08-24 NOTE — ED Notes (Signed)
Attempted report 

## 2015-08-24 NOTE — ED Notes (Signed)
Pt returned from MRI  Dr. Leonel Ramsay at bedside.  Pt remains alert and oriented x's 3.  Skin warm and dry.  Wife at bedside.

## 2015-08-24 NOTE — ED Notes (Signed)
Dr. Porfirio Mylar in to assess pt.

## 2015-08-24 NOTE — H&P (Addendum)
History and Physical    Evan [name] [id number] DOB: 1941-01-06 DOA: 08/24/2015  Referring MD/NP/PA:   PCP: Leonel Ramsay, MD   Patient coming from:  The patient is coming from home.  At baseline, pt is independent for most of ADL.   Chief Complaint: Blurry vision  HPI: Evan [name] is a 75 y.o. male with medical history significant of CAD, s/p of CABG, carotid artery stenosis, s/p of left endarterectomy 2014, poor hearing, A fib on Xarelto, hypertension, hyperlipidemia, diabetes mellitus, who presents with blurry vision.  Patient reports that he has been having bilateral blurry vision and sinus issue in the past 2 weeks. He was treated with penicillin and Mucinex by PCP for sinus issue. He feels better, but continues to have blurry vision. Also has HA. He does not have hearing loss, unilateral weakness, numbness or tingling sensations in extremities. He went to see an eye doctor today and was sent to ED due to concerning for stroke.   Patient denies chest pain, shortness of breath, cough, fever or chills. Patient states that he used to take metformin for diabetes, but stopped it a long time ago due to GI upset and diarrhea. He states that he found his blood pressure was elevated recently, therefore started taking metformin yesterday. He developed diarrhea after started taking metformin. He has 2 or 3 loose bowel movements each day. He does not have nausea, vomiting or abdominal pain. Patient states that he has intermittent dysuria, burning on urination and urinary frequency recently.  ED Course: pt was found to have hypertensive urgency with Bp 215/90, which improved to 188/81 after 1 dose of labetalol 10 mg IV. Negative troponin, INR 1.20, WBC 6.8, temperature normal, bradycardia, electrolytes and renal function okay, pending UA and urine culture. CT demonstrates hemorrhagic infarction. MRI of brain showed subacute infarction in the right PCA territory with hemorrhagic  transformation and mild edema. Pt is placed on SDU for obs. Neurology, Dr. Leonel Ramsay was consulted.  Review of Systems:   General: no fevers, chills, no changes in body weight, has fatigue HEENT: no blurry vision, hearing changes or sore throat Pulm: no dyspnea, coughing, wheezing CV: no chest pain, no palpitations Abd: no nausea, vomiting, abdominal pain, has diarrhea, no nstipation GU: has dysuria, burning on urination, increased urinary frequency, no hematuria  Ext: no leg edema Neuro: no unilateral weakness, numbness, or tingling, or hearing loss. Has blurry vision. Skin: no rash MSK: No muscle spasm, no deformity, no limitation of range of movement in spin Heme: No easy bruising.  Travel history: No recent long distant travel.  Allergy:  Allergies  Allergen Reactions  . Niacin And Related Rash    Red face and burns.    Past Medical History:  Diagnosis Date  . Bruises easily   . Carotid arterial disease ([place])   . Coronary artery disease   . Diabetes mellitus   . Dyspnea    with exertion  . Frequency of urination   . Heart murmur   . HOH (hard of hearing)   . Hyperlipidemia   . Hypertension   . Obesity   . Osteoarthritis    Left wrist  . Torn rotator cuff 2012    Right arm    Past Surgical History:  Procedure Laterality Date  . BACK SURGERY     X2  . CARDIAC CATHETERIZATION  11/29/2005   SEVERE THREE VESSEL OBSTRUCTIVE ATHERSCLEROTIC CAD. ALL GRAFTS WERE PATENT. EF 65%  . CAROTID ENDARTERECTOMY Left 05-28-12  cea  . CAROTID ENDARTERECTOMY Right 01-29-08   cea  . CORONARY ARTERY BYPASS GRAFT  03/2003   X3. LIMA GRAFT TO THE LAD, SAPHENOUS VEIN GRAFT TO THE PA, AND A LEFT RADIAL GRAFT TO THEOBTUSE MARGINAL VESSEL  . ENDARTERECTOMY Left 05/28/2012   Procedure: ENDARTERECTOMY CAROTID;  Surgeon: Elam Dutch, MD;  Location: [facility];  Service: Vascular;  Laterality: Left;  . MOLE SURGERY    . PATCH ANGIOPLASTY Left 05/28/2012   Procedure: WITH dACRON PATCH  ANGIOPLASTY;  Surgeon: Elam Dutch, MD;  Location[facility];  Service: Vascular;  Laterality: Left;    Social History:  reports that he quit smoking about 21 years ago. He has quit using smokeless tobacco. He reports that he does not drink alcohol or use drugs.  Family History:  Family History  Problem Relation Age of Onset  . Heart attack Father   . Heart attack Brother   . Parkinsonism Brother   . Cancer Brother   . Arrhythmia Brother      Prior to Admission medications   Medication Sig Start Date End Date Taking? Authorizing Provider  acetaminophen (TYLENOL) 650 MG CR tablet Take 650 mg by mouth every 8 (eight) hours as needed for pain.    Historical Provider, MD  benazepril (LOTENSIN) 40 MG tablet Take 1 tablet (40 mg total) by mouth daily. 02/11/15   [name] M [name], MD  ezetimibe (ZETIA) 10 MG tablet Take 1 tablet (10 mg total) by mouth daily. 02/19/15   [name] M [name], MD  hydrochlorothiazide (HYDRODIURIL) 25 MG tablet Take 25 mg by mouth every morning.     Historical Provider, MD  loratadine (CLARITIN) 10 MG tablet Take by mouth as needed.    Historical Provider, MD  metFORMIN (GLUCOPHAGE) 500 MG tablet Take 500 mg by mouth 2 (two) times daily with a meal.     Historical Provider, MD  OVER THE COUNTER MEDICATION Take 3 tablets by mouth daily with supper. Replenex for joint strength    Historical Provider, MD  rivaroxaban (XARELTO) 20 MG TABS tablet Take 1 tablet (20 mg total) by mouth daily with supper. 04/10/15   [name] M [name], MD  tamsulosin (FLOMAX) 0.4 MG CAPS capsule Take 1 capsule (0.4 mg total) by mouth daily after supper. 01/23/15   [name] M [name], MD  vitamin B-12 (CYANOCOBALAMIN) 1000 MCG tablet Take 1,000 mcg by mouth daily.    Historical Provider, MD    Physical Exam: Vitals:   08/24/15 2200 08/24/15 2201 08/24/15 2215 08/24/15 2230  BP: 173/75  191/75 174/92  Pulse: 64  (!) 55 (!) 57  Resp: 18  18 18   Temp:  97.7 F (36.5 C)    SpO2: 97%  95% 96%  Weight:       Height:       General: Not in acute distress HEENT:       Eyes: PERRL, EOMI, no scleral icterus.       ENT: No discharge from the ears and nose, no pharynx injection, no tonsillar enlargement.        Neck: No JVD, no bruit, no mass felt. Heme: No neck lymph node enlargement. Cardiac: S1/S2, RRR, No murmurs, No gallops or rubs. Pulm: No rales, wheezing, rhonchi or rubs. Abd: Soft, nondistended, nontender, no rebound pain, no organomegaly, BS present. GU: No hematuria Ext: No pitting leg edema bilaterally. 2+DP/PT pulse bilaterally. Musculoskeletal: No joint deformities, No joint redness or warmth, no limitation of ROM in spin. Skin: No rashes.  Neuro: Alert, oriented X3, cranial nerves II-XII grossly intact, moves all extremities normally. Muscle strength 5/5 in all extremities, sensation to light touch intact. Brachial reflex 2+ bilaterally. Negative Babinski's sign.  Psych: Patient is not psychotic, no suicidal or hemocidal ideation.  Labs on Admission: I have personally reviewed following labs and imaging studies  CBC:  Recent Labs Lab 08/24/15 1740  WBC 6.8  NEUTROABS 4.1  HGB 14.5  HCT 43.8  MCV 95.2  PLT 123456   Basic Metabolic Panel:  Recent Labs Lab 08/24/15 1740  NA 138  K 4.0  CL 105  CO2 23  GLUCOSE 122*  BUN 16  CREATININE 1.05  CALCIUM 9.8   GFR: Estimated Creatinine Clearance: 81.4 mL/min (by C-G formula based on SCr of 1.05 mg/dL). Liver Function Tests:  Recent Labs Lab 08/24/15 1740  AST 27  ALT 24  ALKPHOS 76  BILITOT 0.4  PROT 7.2  ALBUMIN 4.3   No results for input(s): LIPASE, AMYLASE in the last 168 hours. No results for input(s): AMMONIA in the last 168 hours. Coagulation Profile:  Recent Labs Lab 08/24/15 1844  INR 1.20   Cardiac Enzymes: No results for input(s): CKTOTAL, CKMB, CKMBINDEX, TROPONINI in the last 168 hours. BNP (last 3 results) No results for input(s): PROBNP in the last 8760 hours. HbA1C: No results for  input(s): HGBA1C in the last 72 hours. CBG:  Recent Labs Lab 08/24/15 1739 08/24/15 1922  GLUCAP 128* 100*   Lipid Profile: No results for input(s): CHOL, HDL, LDLCALC, TRIG, CHOLHDL, LDLDIRECT in the last 72 hours. Thyroid Function Tests: No results for input(s): TSH, T4TOTAL, FREET4, T3FREE, THYROIDAB in the last 72 hours. Anemia Panel: No results for input(s): VITAMINB12, FOLATE, FERRITIN, TIBC, IRON, RETICCTPCT in the last 72 hours. Urine analysis:    Component Value Date/Time   COLORURINE YELLOW 05/24/2012 [place] 05/24/2012 1535   LABSPEC 1.016 05/24/2012 1535   PHURINE 5.0 05/24/2012 1535   GLUCOSEU 250 (A) 05/24/2012 1535   HGBUR NEGATIVE 05/24/2012 1535   BILIRUBINUR NEGATIVE 05/24/2012 1535   KETONESUR NEGATIVE 05/24/2012 1535   PROTEINUR NEGATIVE 05/24/2012 1535   UROBILINOGEN 0.2 05/24/2012 1535   NITRITE NEGATIVE 05/24/2012 1535   LEUKOCYTESUR NEGATIVE 05/24/2012 1535   Sepsis Labs: @LABRCNTIP (procalcitonin:4,lacticidven:4) )No results found for this or any previous visit (from the past 240 hour(s)).   Radiological Exams on Admission: Ct Head Wo Contrast  Result Date: 08/24/2015 CLINICAL DATA:  Stroke symptoms.  Blurry vision. EXAM: CT HEAD WITHOUT CONTRAST TECHNIQUE: Contiguous axial images were obtained from the base of the skull through the vertex without intravenous contrast. COMPARISON:  None. FINDINGS: Ill-defined hypodensity in the right periventricular temporal occipital lobe with internal ill-defined hemorrhagic components measuring 3.2 x 1.5 x 1.6 cm. Edema and hemorrhage about the right tentorium without midline shift. There is mass effect on the occipital horn of the right lateral ventricle without definite intraventricular hemorrhage component. No hydrocephalus, ventricles are normal for age. Basilar cisterns are patent. There is atherosclerosis of skullbase vasculature. The calvarium is intact. Included paranasal sinuses are well  aerated. Scattered opacification of lower left mastoid air cells. Small calcified sebaceous cyst in the left frontal scalp. IMPRESSION: Hemorrhage with surrounding edema versus hemorrhagic mass involving the right occipital lobe. This causes mass effect on the right lateral ventricle but no midline shift. Recommend MRI without and with contrast for further characterization. Critical Value/emergent results were called by telephone at the time of interpretation on 08/24/2015 at 6:26 pm to Dr.  ELIZABETH REES , who verbally acknowledged these results. Electronically Signed   By: Jeb Levering M.D.   On: 08/24/2015 18:29   Mr Brain W Or Wo Contrast  Result Date: 08/24/2015 CLINICAL DATA:  Headaches and blurred vision over the last 2 weeks. Abnormal eye exam and head CT. EXAM: MRI HEAD WITHOUT AND WITH CONTRAST TECHNIQUE: Multiplanar, multiecho pulse sequences of the brain and surrounding structures were obtained without and with intravenous contrast. CONTRAST:  40mL MULTIHANCE GADOBENATE DIMEGLUMINE 529 MG/ML IV SOLN COMPARISON:  Head CT earlier same day. FINDINGS: There is subacute infarction in the right PCA territory affecting the posteromedial temporal lobe and occipital lobe, with considerable hemorrhagic transformation. Minor involvement of the lateral thalamus. This is consistent with the clinical history of 2 weeks duration of symptoms. No other acute infarction. The brainstem is normal. The cerebellum is normal. Cerebral hemispheres otherwise show mild chronic small-vessel ischemic change of the deep and subcortical white matter. No mass lesion. Mild edema in the region of the infarction with mild mass effect but no midline shift. No extra-axial collection. After contrast administration, there is gyriform enhancement in the region of infarction, also consistent with the subacute time frame. No hydrocephalus. No extra-axial fluid collection. No evidence of neoplastic mass lesion. Pituitary gland is normal.  Sinuses are clear. IMPRESSION: Subacute infarction in the right PCA territory with hemorrhagic transformation and mild edema. Findings are consistent with the clinical time frame of 2 weeks ago. Electronically Signed   By: Nelson Chimes M.D.   On: 08/24/2015 21:00     EKG: Independently reviewed. Atrial fibrillation, QTC 454, bifascicular block   Assessment/Plan Principal Problem:   CVA (cerebral infarction) Active Problems:   Coronary artery disease   Hyperlipidemia   Hypertensive urgency   Obesity   Carotid arterial disease (H[facility])   Diabetes mellitus ([facility])   Atrial fibrillation ([facility])   Diarrhea   CVA (cerebral infarction): CT demonstrates hemorrhagic infarction. MRI of brain showed subacute infarction in the right PCA territory with hemorrhagic transformation and mild edema. Currently patient is hemodynamically stable. Mental status is normal. Neurology, Dr. Leonel Ramsay was consulted.  -Will place pt on SDU for obs - No tPA due to hemorrhagic transformation and out of window of treatment -Highly appreciate Dr. Cecil Cobbs consultation, with follow-up recommendations as follows:   1. HgbA1c, fasting lipid panel  2. CT angiogram of the head and neck  3. Frequent neuro checks  4. Echocardiogram  5. Blood pressure control with goal systolic less than [id number], prn labetalol IV  6. Prophylactic therapy-none for now, pending repeat CT tomorrow  7. Risk factor modification  8. Telemetry monitoring  9. PT consult, OT consult, Speech consult  CAD: s/p of CABG. No CP. -Continue Zetia  Hypertensive urgency: Bp 215/90, which improved to 188/81 after 1 dose of labetalol 10 mg IV. -continue home HCTZ, Lotensin  BPH: stable. 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 Patient just restarted Metformin, but seems to have caused his diarrhea.    -SSI -Check A1c -May need to switch to another oral med if A1c is not at goal.  Atrial Fibrillation: CHA2DS2-VASc Score is 6, needs oral anticoagulation. Patient was on Xarelto at home. Heart rate is well controlled, mild bradycardia currently. No on node blockers at home. -hold Xarelto due to hemorrhagic transformation of ischemic stroke -Telemetry  monitoring  Diarrhea:  Started after retaking metformin, most likely due to metformin GI side effects. -Hold metformin. If continues to have diarrhea, will check C. difficile pcr   DVT ppx: SCD Code Status: Full code Family Communication: Yes, patient's wife at bed side Disposition Plan:  Anticipate discharge back to previous home environment Consults called: Neurology, Dr. Leonel Ramsay  Admission status: SDU/obs  Date of Service 08/24/2015    Ivor Costa [facility] Pager [phone]  If 7PM-7AM, please contact night-coverage [email or web address] Password [facility] 08/24/2015, 10:38 PM

## 2015-08-24 NOTE — ED Notes (Signed)
Pt remains in MRI at this time  

## 2015-08-24 NOTE — ED Notes (Signed)
Pt remains in MRI  At this time

## 2015-08-25 ENCOUNTER — Encounter (HOSPITAL_COMMUNITY): Payer: Self-pay | Admitting: *Deleted

## 2015-08-25 ENCOUNTER — Observation Stay (HOSPITAL_BASED_OUTPATIENT_CLINIC_OR_DEPARTMENT_OTHER): Payer: Medicare Other

## 2015-08-25 ENCOUNTER — Observation Stay (HOSPITAL_COMMUNITY): Payer: Medicare Other

## 2015-08-25 DIAGNOSIS — I10 Essential (primary) hypertension: Secondary | ICD-10-CM | POA: Diagnosis present

## 2015-08-25 DIAGNOSIS — I2510 Atherosclerotic heart disease of native coronary artery without angina pectoris: Secondary | ICD-10-CM | POA: Diagnosis present

## 2015-08-25 DIAGNOSIS — Z951 Presence of aortocoronary bypass graft: Secondary | ICD-10-CM | POA: Diagnosis not present

## 2015-08-25 DIAGNOSIS — I63431 Cerebral infarction due to embolism of right posterior cerebral artery: Secondary | ICD-10-CM | POA: Diagnosis present

## 2015-08-25 DIAGNOSIS — E785 Hyperlipidemia, unspecified: Secondary | ICD-10-CM | POA: Diagnosis present

## 2015-08-25 DIAGNOSIS — Z7901 Long term (current) use of anticoagulants: Secondary | ICD-10-CM | POA: Diagnosis not present

## 2015-08-25 DIAGNOSIS — I482 Chronic atrial fibrillation: Secondary | ICD-10-CM | POA: Diagnosis not present

## 2015-08-25 DIAGNOSIS — E119 Type 2 diabetes mellitus without complications: Secondary | ICD-10-CM | POA: Diagnosis present

## 2015-08-25 DIAGNOSIS — N400 Enlarged prostate without lower urinary tract symptoms: Secondary | ICD-10-CM | POA: Diagnosis present

## 2015-08-25 DIAGNOSIS — I63531 Cerebral infarction due to unspecified occlusion or stenosis of right posterior cerebral artery: Secondary | ICD-10-CM

## 2015-08-25 DIAGNOSIS — I6789 Other cerebrovascular disease: Secondary | ICD-10-CM | POA: Diagnosis not present

## 2015-08-25 DIAGNOSIS — Z87891 Personal history of nicotine dependence: Secondary | ICD-10-CM | POA: Diagnosis not present

## 2015-08-25 DIAGNOSIS — R197 Diarrhea, unspecified: Secondary | ICD-10-CM | POA: Diagnosis not present

## 2015-08-25 DIAGNOSIS — I160 Hypertensive urgency: Secondary | ICD-10-CM

## 2015-08-25 DIAGNOSIS — H9190 Unspecified hearing loss, unspecified ear: Secondary | ICD-10-CM | POA: Diagnosis present

## 2015-08-25 DIAGNOSIS — I4891 Unspecified atrial fibrillation: Secondary | ICD-10-CM | POA: Diagnosis present

## 2015-08-25 DIAGNOSIS — E669 Obesity, unspecified: Secondary | ICD-10-CM | POA: Diagnosis present

## 2015-08-25 DIAGNOSIS — E118 Type 2 diabetes mellitus with unspecified complications: Secondary | ICD-10-CM | POA: Diagnosis not present

## 2015-08-25 DIAGNOSIS — H538 Other visual disturbances: Secondary | ICD-10-CM | POA: Diagnosis present

## 2015-08-25 DIAGNOSIS — Z6834 Body mass index (BMI) 34.0-34.9, adult: Secondary | ICD-10-CM | POA: Diagnosis not present

## 2015-08-25 DIAGNOSIS — M1990 Unspecified osteoarthritis, unspecified site: Secondary | ICD-10-CM | POA: Diagnosis present

## 2015-08-25 LAB — ECHOCARDIOGRAM COMPLETE
HEIGHTINCHES: 72 in
Weight: 3880.1 oz

## 2015-08-25 LAB — GLUCOSE, CAPILLARY
GLUCOSE-CAPILLARY: 117 mg/dL — AB (ref 65–99)
GLUCOSE-CAPILLARY: 151 mg/dL — AB (ref 65–99)
Glucose-Capillary: 153 mg/dL — ABNORMAL HIGH (ref 65–99)
Glucose-Capillary: 183 mg/dL — ABNORMAL HIGH (ref 65–99)

## 2015-08-25 LAB — MRSA PCR SCREENING: MRSA by PCR: NEGATIVE

## 2015-08-25 LAB — LIPID PANEL
Cholesterol: 149 mg/dL (ref 0–200)
HDL: 39 mg/dL — ABNORMAL LOW (ref >40)
LDL CALC: 95 mg/dL (ref 0–99)
TRIGLYCERIDES: 73 mg/dL (ref <150)
Total CHOL/HDL Ratio: 3.8 RATIO
VLDL: 15 mg/dL (ref 0–40)

## 2015-08-25 MED ORDER — AMLODIPINE BESYLATE 10 MG PO TABS
10.0000 mg | ORAL_TABLET | Freq: Every day | ORAL | Status: DC
  Administered 2015-08-25 – 2015-08-26 (×2): 10 mg via ORAL
  Filled 2015-08-25 (×2): qty 1

## 2015-08-25 MED ORDER — SIMVASTATIN 20 MG PO TABS
10.0000 mg | ORAL_TABLET | Freq: Every day | ORAL | Status: DC
  Administered 2015-08-25: 10 mg via ORAL
  Filled 2015-08-25: qty 1

## 2015-08-25 MED ORDER — ASPIRIN EC 325 MG PO TBEC
325.0000 mg | DELAYED_RELEASE_TABLET | Freq: Every day | ORAL | Status: DC
  Administered 2015-08-26: 325 mg via ORAL
  Filled 2015-08-25: qty 1

## 2015-08-25 MED ORDER — HYDRALAZINE HCL 20 MG/ML IJ SOLN: 5.0000 mg | INTRAMUSCULAR | Status: DC | PRN

## 2015-08-25 MED ORDER — IOPAMIDOL (ISOVUE-370) INJECTION 76%
INTRAVENOUS | Status: AC
  Administered 2015-08-25: 80 mL
  Filled 2015-08-25: qty 100

## 2015-08-25 MED ORDER — ASPIRIN EC 81 MG PO TBEC
81.0000 mg | DELAYED_RELEASE_TABLET | Freq: Every day | ORAL | Status: DC
  Administered 2015-08-25: 81 mg via ORAL
  Filled 2015-08-25: qty 1

## 2015-08-25 NOTE — Progress Notes (Signed)
OT Cancellation Note  Patient Details Name: Evan [name] MRN: [id number] DOB: 1941-01-24   Cancelled Treatment:    Reason Eval/Treat Not Completed: Patient at procedure or test/ unavailable. Pt in CT imaging. Will check back on pt later today for eval.  Almon Register [id number] 08/25/2015, 8:13 AM

## 2015-08-25 NOTE — Evaluation (Signed)
Occupational Therapy Evaluation Patient Details Name: Evan [name] MRN: [id number] DOB: Oct 20, 1940 Today's Date: 08/25/2015    History of Present Illness 75 y.o. male with medical history significant of CAD, s/p of CABG, carotid artery stenosis, s/p of left endarterectomy 2014, poor hearing, A fib on Xarelto, hypertension, hyperlipidemia, diabetes mellitus, who presents with blurry vision x 2 weeks. 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 Minor involvement of the lateral thalamus   Clinical Impression   This 75 yo male admitted and underwent above presents to acute OT with deficits below (see OT problem list) thus affecting his PLOF of up until 2 weeks ago of being totally independent including driving. He will benefit from acute OT with follow up OT as an outpatient.    Follow Up Recommendations  Outpatient OT;Other (comment) ([place] if they have OP OT that deals with visual field cuts (if not then will need [facility] outpatient OT))    Equipment Recommendations  None recommended by OT       Precautions / Restrictions Precautions Precautions: Fall Precaution Comments: Pt is mildly unsteady on his feet which is intensified by his need to frequently attempt to urinate and multiple lines Restrictions Weight Bearing Restrictions: No      Mobility Bed Mobility Overal bed mobility: Independent             General bed mobility comments: Pt OOB in the recliner chair.  Transfers Overall transfer level: Independent Equipment used: None                Balance Overall balance assessment: Independent (for sitting and static standing) Sitting-balance support: Feet supported;No upper extremity supported Sitting balance-Leahy Scale: Good     Standing balance support: No upper extremity supported Standing balance-Leahy Scale: Good                       ADL                                         General ADL Comments: S only due to left up field cut bilaterally. Made pt aware that he should not drive, that he needs to see an eye doctor that does a full visual field assessment and then take this to his primary care doctor to see if he clears him to drive.      Vision Vision Assessment?: Yes Eye Alignment: Within Functional Limits Ocular Range of Motion: Within Functional Limits Alignment/Gaze Preference: Within Defined Limits Tracking/Visual Pursuits: Decreased smoothness of eye movement to LEFT superior field Saccades: Additional eye shifts occurred during testing Visual Fields: Left superior homonymous quadranopsia Additional Comments: Decreased scanning in environment (left and right)          Pertinent Vitals/Pain Pain Assessment:  (some discomfort from a full bladder)     Hand Dominance Right   Extremity/Trunk Assessment Upper Extremity Assessment Upper Extremity Assessment: Defer to OT evaluation     Cervical / Trunk Assessment Cervical / Trunk Assessment: Normal   Communication Communication Communication: HOH   Cognition Arousal/Alertness: Awake/alert Behavior During Therapy: WFL for tasks assessed/performed Overall Cognitive Status: Within Functional Limits for tasks assessed  Home Living Family/patient expects to be discharged to:: Private residence Living Arrangements: Spouse/significant other Available Help at Discharge: Family;Available 24 hours/day Type of Home: House Home Access: Other (comment) (per daughter they avoid the stairs)           Bathroom Shower/Tub: Tub/shower unit;Curtain Shower/tub characteristics: Curtain       Home Equipment: Radio producer - single point (per daughter it is a walking "stick")          Prior Functioning/Environment Level of Independence: Independent             OT Diagnosis:  Disturbance of vision   OT Problem List: Impaired vision/perception   OT Treatment/Interventions:      OT Goals(Current goals can be found in the care plan section) Acute Rehab OT Goals Patient Stated Goal: to be able to go on vacation in [place] this weekend with family OT Goal Formulation: With patient/family Time For Goal Achievement: 09/01/15 Potential to Achieve Goals: Good  OT Frequency:                End of Session Equipment Utilized During Treatment:  (none) Nurse Communication: Other (comment) (pt having frequency and urgency with urination)  Activity Tolerance: Patient tolerated treatment well Patient left: in chair;with call bell/phone within reach;with family/visitor present   Time: 1110-1150 OT Time Calculation (min): 40 min Charges:  OT General Charges $OT Visit: 1 Procedure OT Evaluation $OT Eval Moderate Complexity: 1 Procedure OT Treatments $Self Care/Home Management : 8-22 mins $Therapeutic Activity: 8-22 mins G-Codes: OT G-codes **NOT FOR INPATIENT CLASS** Functional Assessment Tool Used: Clinical observation Functional Limitation: Self care Self Care Current Status [id number]): At least 1 percent but less than 20 percent impaired, limited or restricted Self Care Goal Status [id number]): At least 1 percent but less than 20 percent impaired, limited or restricted  Almon Register [id number] 08/25/2015, 1:38 PM

## 2015-08-25 NOTE — Progress Notes (Signed)
Echocardiogram 2D Echocardiogram has been performed.  Evan [name] 08/25/2015, 11:03 AM

## 2015-08-25 NOTE — Progress Notes (Signed)
PROGRESS NOTE    Evan [name]  [id number] DOB: [birth year] DOA: 08/24/2015 PCP: Leonel Ramsay, MD    Brief Narrative:  Patient is a 75 year old gentleman history of diabetes type 2, atrial fibrillation on chronic anticoagulation, hyperlipidemia, coronary artery disease status post CABG, carotid arterial disease status post endarterectomy presented from ophthalmologist office with blurry vision and consents for an acute CVA. MRI head consistent with an acute CVA with hemorrhagic conversion. Patient also noted to be in hypertensive urgency. Neurology following. Stroke workup underway.   Assessment & Plan:   Principal Problem:   CVA (cerebral infarction) Active Problems:   Coronary artery disease   Hyperlipidemia   Hypertensive urgency   Obesity   Carotid arterial disease (HCC)   Diabetes mellitus (HCC)   Atrial fibrillation ([place])   Diarrhea  #1 acute ischemic stroke with subsequent hemorrhagic conversion MRI head. Hemorrhagic conversion likely secondary to anticoagulation. Patient also noted to be in hypertensive urgency on admission blood pressure improved with goal systolic blood pressure for now less than 180. Patient states blurred vision improving. Stroke workup underway. CT angiogram head and neck done today 08/25/2015, no significant intracranial anterior circulation finding. Right vertebral artery occluded at the origin but reconstituted in the mid cervical region by collaterals with antegrade flow beyond that. Dominant left vertebral artery patent with 50% origin stenosis. Major posterior circulation branch vessels are patent including a right PCA but not as many branches visible on the right as the left consistent with PCA branch vessel embolic infarction. 2-D echo pending. Fasting lipid panel with a LDL of 95. Goal LDL less than 70. Will start a statin. Continue Zetia. Hemoglobin A1c pending. PT/OT/ST pending. No prophylactic anticoagulation due to hemorrhagic  conversion. Neurology following.  #2 hypertensive urgency Improved. Continue current regimen of benazepril, HCTZ.  #3 hyperlipidemia LDL of 95. Goal LDL less than 70. Continue Zetia. We'll status statin.  #4 diabetes mellitus 2 Hemoglobin A1c pending. Hemoglobin A1c was 6.2 on 06/08/2012. CBGs have ranged from 100-153. Continue sliding scale insulin. On discharge will recommend placing patient on glipizide is that of metformin secondary to GI side effects patient has endorsed.  #5 atrial fibrillation CHA2DS2VASC 6 Currently rate controlled. Patient was on Xarelto for anticoagulation prior to admission however due to hemorrhagic conversion Xarelto on hold. Will defer resumption of anticoagulation to neurology.  #6 diarrhea Likely GI side effects from metformin. Metformin on hold. On discharge may consider placing patient on another oral hypoglycemic such as glipizide.  #7 BPH UA with cultures and sensitivities pending. Continue Flomax.  #8 coronary artery disease status post CABG Patient currently asymptomatic. Continue Zetia, benazepril, HCTZ.     DVT prophylaxis: SCDs Code Status: Full Family Communication: Updated patient and family at bedside. Disposition Plan: Home pending stroke evaluation. Hopefully tomorrow.   Consultants:   Neurology: Dr. Leonel Ramsay 08/24/2015  Procedures:   CT head 08/24/2015  CT angiogram head and neck 08/25/2015  MRI brain 08/24/2015  Antimicrobials:   None   Subjective: Patient states blurry vision improved. No chest pain. No shortness of breath. Patient denies any further diarrhea. Patient complaining of gas. Patient asking when he can go home.  Objective: Vitals:   08/25/15 0400 08/25/15 0433 08/25/15 0500 08/25/15 0600  BP: 138/64 138/64 139/73 (!) 151/77  Pulse: (!) 58 (!) 53 (!) 57 (!) 57  Resp: 19 18 17 19   Temp:  98.3 F (36.8 C)    TempSrc:  Oral    SpO2: 93% 96% 95% 93%  Weight:  Height:        Intake/Output  Summary (Last 24 hours) at 08/25/15 0909 Last data filed at 08/25/15 0600  Gross per 24 hour  Intake           528.75 ml  Output              400 ml  Net           128.75 ml   Filed Weights   08/24/15 1736 08/24/15 2317  Weight: 116.6 kg (257 lb) 110 kg (242 lb 8.1 oz)    Examination:  General exam: Appears calm and comfortable  Respiratory system: Clear to auscultation. Respiratory effort normal. Cardiovascular system: S1 & S2 heard, RRR. No JVD, murmurs, rubs, gallops or clicks. No pedal edema. Gastrointestinal system: Abdomen is nondistended, soft and nontender. No organomegaly or masses felt. Normal bowel sounds heard. Central nervous system: Alert and oriented. No focal neurological deficits. Extremities: Symmetric 5 x 5 power. Skin: No rashes, lesions or ulcers Psychiatry: Judgement and insight appear normal. Mood & affect appropriate.     Data Reviewed: I have personally reviewed following labs and imaging studies  CBC:  Recent Labs Lab 08/24/15 1740  WBC 6.8  NEUTROABS 4.1  HGB 14.5  HCT 43.8  MCV 95.2  PLT 123456   Basic Metabolic Panel:  Recent Labs Lab 08/24/15 1740  NA 138  K 4.0  CL 105  CO2 23  GLUCOSE 122*  BUN 16  CREATININE 1.05  CALCIUM 9.8   GFR: Estimated Creatinine Clearance: 79.1 mL/min (by C-G formula based on SCr of 1.05 mg/dL). Liver Function Tests:  Recent Labs Lab 08/24/15 1740  AST 27  ALT 24  ALKPHOS 76  BILITOT 0.4  PROT 7.2  ALBUMIN 4.3   No results for input(s): LIPASE, AMYLASE in the last 168 hours. No results for input(s): AMMONIA in the last 168 hours. Coagulation Profile:  Recent Labs Lab 08/24/15 1844  INR 1.20   Cardiac Enzymes: No results for input(s): CKTOTAL, CKMB, CKMBINDEX, TROPONINI in the last 168 hours. BNP (last 3 results) No results for input(s): PROBNP in the last 8760 hours. HbA1C: No results for input(s): HGBA1C in the last 72 hours. CBG:  Recent Labs Lab 08/24/15 1739 08/24/15 1922  08/24/15 2329 08/25/15 0740  GLUCAP 128* 100* 131* 153*   Lipid Profile:  Recent Labs  08/25/15 0423  CHOL 149  HDL 39*  LDLCALC 95  TRIG 73  CHOLHDL 3.8   Thyroid Function Tests: No results for input(s): TSH, T4TOTAL, FREET4, T3FREE, THYROIDAB in the last 72 hours. Anemia Panel: No results for input(s): VITAMINB12, FOLATE, FERRITIN, TIBC, IRON, RETICCTPCT in the last 72 hours. Sepsis Labs: No results for input(s): PROCALCITON, LATICACIDVEN in the last 168 hours.  Recent Results (from the past 240 hour(s))  MRSA PCR Screening     Status: None   Collection Time: 08/25/15  1:19 AM  Result Value Ref Range Status   MRSA by PCR NEGATIVE NEGATIVE Final    Comment:        The GeneXpert MRSA Assay (FDA approved for NASAL specimens only), is one component of a comprehensive MRSA colonization surveillance program. It is not intended to diagnose MRSA infection nor to guide or monitor treatment for MRSA infections.          Radiology Studies: Ct Angio Head W Or Wo Contrast  Result Date: 08/25/2015 CLINICAL DATA:  Hemorrhagic subacute infarction in the right PCA territory. EXAM: CT ANGIOGRAPHY HEAD AND NECK TECHNIQUE: Multidetector  CT imaging of the head and neck was performed using the standard protocol during bolus administration of intravenous contrast. Multiplanar CT image reconstructions and MIPs were obtained to evaluate the vascular anatomy. Carotid stenosis measurements (when applicable) are obtained utilizing NASCET criteria, using the distal internal carotid diameter as the denominator. CONTRAST:  80 cc Isovue 370 COMPARISON:  CT and MRI yesterday FINDINGS: CTA NECK Aortic arch: Aortic atherosclerosis.  No aneurysm or dissection. Right carotid system: Right common carotid artery shows atherosclerotic plaque but is widely patent to the bifurcation region. There is atherosclerotic plaque at the ICA bulb region with minimal diameter of 5.5 mm. Compared to a more distal  cervical ICA diameter of 5 mm, there is no stenosis. Upper cervical ICA is tortuous. Left carotid system: Common carotid artery shows atherosclerotic plaque. There is narrowing 1.5 cm proximal to the bifurcation with a minimal diameter of 5 mm. Compared to an expected diameter of 9 mm, this indicates a 40% common carotid stenosis. There is atherosclerotic disease beyond that stenosis. There has been previous carotid endarterectomy with a widely patent carotid bifurcation region. Proximal ICA shows atherosclerosis but no stenosis. Upper cervical ICA is tortuous but widely patent. Vertebral arteries:The left vertebral artery is dominant. There is 50% stenosis at the left vertebral artery origin. Beyond that, the vessel shows some atherosclerotic plaque but no flow-limiting stenosis through the cervical region. The right vertebral artery is occluded proximally and reconstitutes in the midcervical region by cervical collaterals. This is a small vessel that does show antegrade flow beyond that point. Skeleton: Ordinary cervical spondylosis. Other neck: No mass or lymphadenopathy.  Lung apices are clear. CTA HEAD Anterior circulation: Both internal carotid arteries are patent through the siphon region. There is atherosclerotic calcification throughout both carotid siphon regions with stenosis in that segment estimated at 30-40% bilaterally. Supraclinoid internal carotid arteries are widely patent. Anterior and middle cerebral vessels are patent bilaterally without proximal stenosis, aneurysm or vascular malformation. Posterior circulation: Both vertebral arteries are patent at the foramen magnum level with the left being dominant. There is atherosclerotic calcification but no stenosis greater than 20%. Both vessels give supply to the basilar. No basilar stenosis. Posterior circulation branch vessels are all patent proximally, but there are some missing branch vessels of the right PCA compared to the left consistent with  embolic occlusion. Venous sinuses: Patent and normal Anatomic variants: None significant Delayed phase: Enhancement in the subacute right PCA infarction region as also demonstrated at MRI. IMPRESSION: Aortic atherosclerosis. Atherosclerosis diffusely. Atherosclerosis at the right carotid bifurcation but no stenosis. Atherosclerosis of the left common carotid artery with stenosis 1.5 cm proximal to the bifurcation of 40%. Previous carotid endarterectomy on the left is widely patent. Atherosclerosis of both carotid siphon regions with stenosis estimated at 30-40% in that region bilaterally. No significant intracranial anterior circulation finding. 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 Dominant left vertebral artery is patent, but with 50% origin stenosis. Major posterior circulation branch vessels are patent, including the right PCA, but there are not as many branches visible on the right as the left, consistent with PCA branch vessel embolic infarction. Electronically Signed   By: Nelson Chimes M.D.   On: 08/25/2015 08:45   Ct Head Wo Contrast  Result Date: 08/24/2015 CLINICAL DATA:  Stroke symptoms.  Blurry vision. EXAM: CT HEAD WITHOUT CONTRAST TECHNIQUE: Contiguous axial images were obtained from the base of the skull through the vertex  without intravenous contrast. COMPARISON:  None. FINDINGS: Ill-defined hypodensity in the right periventricular temporal occipital lobe with internal ill-defined hemorrhagic components measuring 3.2 x 1.5 x 1.6 cm. Edema and hemorrhage about the right tentorium without midline shift. There is mass effect on the occipital horn of the right lateral ventricle without definite intraventricular hemorrhage component. No hydrocephalus, ventricles are normal for age. Basilar cisterns are patent. There is atherosclerosis of skullbase vasculature. The calvarium is intact. Included paranasal  sinuses are well aerated. Scattered opacification of lower left mastoid air cells. Small calcified sebaceous cyst in the left frontal scalp. IMPRESSION: Hemorrhage with surrounding edema versus hemorrhagic mass involving the right occipital lobe. This causes mass effect on the right lateral ventricle but no midline shift. Recommend MRI without and with contrast for further characterization. Critical Value/emergent results were called by telephone at the time of interpretation on 08/24/2015 at 6:26 pm to Dr. Quintella Reichert , who verbally acknowledged these results. Electronically Signed   By: Jeb Levering M.D.   On: 08/24/2015 18:29   Ct Angio Neck W Or Wo Contrast  Result Date: 08/25/2015 CLINICAL DATA:  Hemorrhagic subacute infarction in the right PCA territory. EXAM: CT ANGIOGRAPHY HEAD AND NECK TECHNIQUE: Multidetector CT imaging of the head and neck was performed using the standard protocol during bolus administration of intravenous contrast. Multiplanar CT image reconstructions and MIPs were obtained to evaluate the vascular anatomy. Carotid stenosis measurements (when applicable) are obtained utilizing NASCET criteria, using the distal internal carotid diameter as the denominator. CONTRAST:  80 cc Isovue 370 COMPARISON:  CT and MRI yesterday FINDINGS: CTA NECK Aortic arch: Aortic atherosclerosis.  No aneurysm or dissection. Right carotid system: Right common carotid artery shows atherosclerotic plaque but is widely patent to the bifurcation region. There is atherosclerotic plaque at the ICA bulb region with minimal diameter of 5.5 mm. Compared to a more distal cervical ICA diameter of 5 mm, there is no stenosis. Upper cervical ICA is tortuous. Left carotid system: Common carotid artery shows atherosclerotic plaque. There is narrowing 1.5 cm proximal to the bifurcation with a minimal diameter of 5 mm. Compared to an expected diameter of 9 mm, this indicates a 40% common carotid stenosis. There is  atherosclerotic disease beyond that stenosis. There has been previous carotid endarterectomy with a widely patent carotid bifurcation region. Proximal ICA shows atherosclerosis but no stenosis. Upper cervical ICA is tortuous but widely patent. Vertebral arteries:The left vertebral artery is dominant. There is 50% stenosis at the left vertebral artery origin. Beyond that, the vessel shows some atherosclerotic plaque but no flow-limiting stenosis through the cervical region. The right vertebral artery is occluded proximally and reconstitutes in the midcervical region by cervical collaterals. This is a small vessel that does show antegrade flow beyond that point. Skeleton: Ordinary cervical spondylosis. Other neck: No mass or lymphadenopathy.  Lung apices are clear. CTA HEAD Anterior circulation: Both internal carotid arteries are patent through the siphon region. There is atherosclerotic calcification throughout both carotid siphon regions with stenosis in that segment estimated at 30-40% bilaterally. Supraclinoid internal carotid arteries are widely patent. Anterior and middle cerebral vessels are patent bilaterally without proximal stenosis, aneurysm or vascular malformation. Posterior circulation: Both vertebral arteries are patent at the foramen magnum level with the left being dominant. There is atherosclerotic calcification but no stenosis greater than 20%. Both vessels give supply to the basilar. No basilar stenosis. Posterior circulation branch vessels are all patent proximally, but there are some missing branch vessels of the right PCA  compared to the left consistent with embolic occlusion. Venous sinuses: Patent and normal Anatomic variants: None significant Delayed phase: Enhancement in the subacute right PCA infarction region as also demonstrated at MRI. IMPRESSION: Aortic atherosclerosis. Atherosclerosis diffusely. Atherosclerosis at the right carotid bifurcation but no stenosis. Atherosclerosis of the  left common carotid artery with stenosis 1.5 cm proximal to the bifurcation of 40%. Previous carotid endarterectomy on the left is widely patent. Atherosclerosis of both carotid siphon regions with stenosis estimated at 30-40% in that region bilaterally. No significant intracranial anterior circulation finding. 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 Dominant left vertebral artery is patent, but with 50% origin stenosis. Major posterior circulation branch vessels are patent, including the right PCA, but there are not as many branches visible on the right as the left, consistent with PCA branch vessel embolic infarction. Electronically Signed   By: Nelson Chimes M.D.   On: 08/25/2015 08:45   Mr Brain W Or Wo Contrast  Result Date: 08/24/2015 CLINICAL DATA:  Headaches and blurred vision over the last 2 weeks. Abnormal eye exam and head CT. EXAM: MRI HEAD WITHOUT AND WITH CONTRAST TECHNIQUE: Multiplanar, multiecho pulse sequences of the brain and surrounding structures were obtained without and with intravenous contrast. CONTRAST:  42mL MULTIHANCE GADOBENATE DIMEGLUMINE 529 MG/ML IV SOLN COMPARISON:  Head CT earlier same day. FINDINGS: There is subacute infarction in the right PCA territory affecting the posteromedial temporal lobe and occipital lobe, with considerable hemorrhagic transformation. Minor involvement of the lateral thalamus. This is consistent with the clinical history of 2 weeks duration of symptoms. No other acute infarction. The brainstem is normal. The cerebellum is normal. Cerebral hemispheres otherwise show mild chronic small-vessel ischemic change of the deep and subcortical white matter. No mass lesion. Mild edema in the region of the infarction with mild mass effect but no midline shift. No extra-axial collection. After contrast administration, there is gyriform enhancement in the region of  infarction, also consistent with the subacute time frame. No hydrocephalus. No extra-axial fluid collection. No evidence of neoplastic mass lesion. Pituitary gland is normal. Sinuses are clear. IMPRESSION: Subacute infarction in the right PCA territory with hemorrhagic transformation and mild edema. Findings are consistent with the clinical time frame of 2 weeks ago. Electronically Signed   By: Nelson Chimes M.D.   On: 08/24/2015 21:00        Scheduled Meds: .  stroke: mapping our early stages of recovery book   Does not apply Once  . benazepril  40 mg Oral Daily  . ezetimibe  10 mg Oral QPM  . hydrochlorothiazide  25 mg Oral q morning - 10a  . insulin aspart  0-5 Units Subcutaneous QHS  . insulin aspart  0-9 Units Subcutaneous TID WC  . tamsulosin  0.4 mg Oral QPC supper  . vitamin B-12  1,000 mcg Oral Daily   Continuous Infusions: . sodium chloride 1,000 mL (08/24/15 2357)     LOS: 0 days    Time spent: 59 mins    THOMPSON,DANIEL, MD [facility] Pager [phone]  If 7PM-7AM, please contact night-coverage [email or web address] Password [email or web address] 08/25/2015, 9:09 AM

## 2015-08-25 NOTE — Progress Notes (Addendum)
STROKE TEAM PROGRESS NOTE   HISTORY OF PRESENT ILLNESS (per record) Evan [name] is a 75 y.o. male reports that began having problems with his vision 2 weeks ago. States that he had a headache at that time. He went to see an eye doctor today he was concerned that he may have had a stroke and therefore referred him to the emergency department. In the ER, he had a head CT which demonstrates hemorrhagic infarction. He does take rivaroxaban, but his last dose was more than 24 hours ago. He was LKW: 2 weeks ago. Patient was not administered IV t-PA secondary to out of window, hemorrhagic conversion. He was admitted for further evaluation and treatment.   SUBJECTIVE (INTERVAL HISTORY) His family are at the bedside.  He is lying in the bed. he states he takes his xarelto every day - been on it over 1 year. Possibly missed one time, but not over twice. Overall he feels his condition is gradually improving. He reports recent stress due to sickness in his family. I explained diagnosis and planned treatment to pt, wife, daughter and family.    OBJECTIVE Temp:  [97.5 F (36.4 C)-98.3 F (36.8 C)] 98.3 F (36.8 C) (08/01 0433) Pulse Rate:  [43-73] 57 (08/01 0600) Cardiac Rhythm: Atrial fibrillation (08/01 0726) Resp:  [12-20] 19 (08/01 0600) BP: (138-215)/(56-104) 151/77 (08/01 0600) SpO2:  [93 %-100 %] 93 % (08/01 0600) Weight:  [110 kg (242 lb 8.1 oz)-116.6 kg (257 lb)] 110 kg (242 lb 8.1 oz) (07/31 2317)  CBC:  Recent Labs Lab 08/24/15 1740  WBC 6.8  NEUTROABS 4.1  HGB 14.5  HCT 43.8  MCV 95.2  PLT 123456    Basic Metabolic Panel:  Recent Labs Lab 08/24/15 1740  NA 138  K 4.0  CL 105  CO2 23  GLUCOSE 122*  BUN 16  CREATININE 1.05  CALCIUM 9.8    Lipid Panel:    Component Value Date/Time   CHOL 149 08/25/2015 0423   TRIG 73 08/25/2015 0423   HDL 39 (L) 08/25/2015 0423   CHOLHDL 3.8 08/25/2015 0423   VLDL 15 08/25/2015 0423   LDLCALC 95 08/25/2015 0423   HgbA1c:  Lab  Results  Component Value Date   HGBA1C 6.2 (H) 05/29/2012   Urine Drug Screen: No results found for: LABOPIA, COCAINSCRNUR, LABBENZ, AMPHETMU, THCU, LABBARB    IMAGING I have personally reviewed the radiological images below and agree with the radiology interpretations.  Ct Head Wo Contrast 08/24/2015 Hemorrhage with surrounding edema versus hemorrhagic mass involving the right occipital lobe. This causes mass effect on the right lateral ventricle but no midline shift.   Ct Angio Head & Neck W Or Wo Contrast 08/25/2015 Aortic atherosclerosis. Atherosclerosis diffusely. Atherosclerosis at the right carotid bifurcation but no stenosis. Atherosclerosis of the left common carotid artery with stenosis 1.5 cm proximal to the bifurcation of 40%. Previous carotid endarterectomy on the left is widely patent. Atherosclerosis of both carotid siphon regions with stenosis estimated at 30-40% in that region bilaterally. No significant intracranial anterior circulation finding. 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 Dominant left vertebral artery is patent, but with 50% origin stenosis. Major posterior circulation branch vessels are patent, including the right PCA, but there are not as many branches visible on the right as the left, consistent with PCA branch vessel embolic infarction.   Mr Jeri Cos Or Wo Contrast 08/24/2015 Subacute infarction in the  right PCA territory with hemorrhagic transformation and mild edema. Findings are consistent with the clinical time frame of 2 weeks ago.   TTE - pending   PHYSICAL EXAM  Temp:  [97.5 F (36.4 C)-98.3 F (36.8 C)] 98.3 F (36.8 C) (08/01 0433) Pulse Rate:  [43-73] 57 (08/01 0600) Resp:  [12-20] 19 (08/01 0600) BP: (138-215)/(56-104) 151/77 (08/01 0600) SpO2:  [93 %-100 %] 93 % (08/01 0600) Weight:  [242 lb 8.1 oz (110 kg)-257 lb (116.6 kg)] 242 lb 8.1 oz (110  kg) (07/31 2317)  General - Well nourished, well developed, in no apparent distress.  Ophthalmologic - Fundi not visualized due to eye movement.  Cardiovascular - irregularly irregular heart rate and rhythm.  Mental Status -  Level of arousal and orientation to time, place, and person were intact. Language including expression, naming, repetition, comprehension was assessed and found intact. Fund of Knowledge was assessed and was intact.  Cranial Nerves II - XII - II - left upper quadrantanopia  III, IV, VI - Extraocular movements intact. V - Facial sensation intact bilaterally. VII - Facial movement intact bilaterally. VIII - Hearing & vestibular intact bilaterally. X - Palate elevates symmetrically. XI - Chin turning & shoulder shrug intact bilaterally. XII - Tongue protrusion intact.  Motor Strength - The patient's strength was normal in all extremities and pronator drift was absent.  Bulk was normal and fasciculations were absent.   Motor Tone - Muscle tone was assessed at the neck and appendages and was normal.  Reflexes - The patient's reflexes were 1+ in all extremities and he had no pathological reflexes.  Sensory - Light touch, temperature/pinprick were assessed and were symmetrical.    Coordination - The patient had normal movements in the hands and feet with no ataxia or dysmetria.  Tremor was absent.  Gait and Station - deferred.   ASSESSMENT/PLAN [name] is a 75 y.o. male with history of CAD, s/p of CABG, carotid artery stenosis, s/p of left endarterectomy 2014, poor hearing, A fib on Xarelto, hypertension, hyperlipidemia, diabetes mellitus presenting with blurry vision. He did not receive IV t-PA due to out of the window, hemorrhagic converson.   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 L CEA patent. Bil 30-40% stenosis at ICA origin. R VA occluded at origin. L VA w/ 50% stenosis at origin.   2D Echo  pending   LDL 95  HgbA1c pending  SCDs ordered for VTE prophylaxis  Diet heart healthy/carb modified Room service appropriate? Yes; Fluid consistency: Thin  Xarelto (rivaroxaban) daily prior to admission, now on hold given hemorrhagic transformation. Recommend change to Eliquis (apixaban) daily bid. For now, start aspirin daily with change to eliquis in 1 week  Ongoing aggressive stroke risk factor management  Therapy recommendations:  pending   Disposition:  Pending. Anticipate return home  [facility] for transfer to the floor from stroke standpoint  Plan follow up with Dr. Chucky May for family vacation this week in [place], [place] from stroke standpoint  Atrial Fibrillation  Home anticoagulation:  Xarelto (rivaroxaban) daily   Start aspirin 325 mg daily at this time  Plan change to eliquis in about 1 week  Carotid stenosis  S/p left CEA in 2014  CTA head and neck showed L CEA patent. Bil 30-40% stenosis at ICA  origin.  Has follow up with [facility] for monitoring   Hypertensive Urgency  BP 215/90 on presentation in setting of neurologic symptoms  BP still elevated, 206/85 during the night  On labetalol and hydralazine PRN  BP goal < 160 due to hemorrhagic transformation  Monitor BP at home daily, document  On lotensin, HCTZ, add amlodipine  Hyperlipidemia  Home meds:  No statin, on zetia 10 mg daily, resumed in hospital  Intolerant to atorvastatin and simvastatin due to myalgias in his leg  LDL 95, goal < 70  Continue zetia at discharge  Diabetes  HgbA1c pending, goal < 7.0  CBG controlled  Other Stroke Risk Factors  Former smoker  Obesity, Body mass index is 32.89 kg/m., recommend weight loss, diet and exercise as appropriate   Coronary artery disease s/p CABG - follows with cardiology  Other Active  Problems  BPH  Diarrhea, felt to be secondary to metformin  Hospital day # 0  Radene Journey [facility] [place] for Pager information 08/25/2015 9:43 AM   I, the attending vascular neurologist, have personally obtained a history, examined the patient, evaluated laboratory data, individually viewed imaging studies and agree with radiology interpretations. I obtained additional history from pt's wife and daughter at bedside. Together with the NP/PA, we formulated the assessment and plan of care which reflects our mutual decision.  I have made any additions or clarifications directly to the above note and agree with the findings and plan as currently documented.   Right PCA infarct with hemorrhagic transformation, about 47 weeks old. Has afib on Xarelto which is on hold due to hemorrhage. Feel safe to restart AC in 3 weeks post hemorrhage. Recommend to start on eliquis in one week, bridging with ASA 325mg . Control BP, goal < 160.  Rosalin Hawking, MD PhD Stroke Neurology 08/25/2015 12:12 PM       To contact Stroke Continuity provider, please refer to [email or web address] After hours, contact General Neurology

## 2015-08-25 NOTE — Progress Notes (Signed)
SLP Cancellation Note  Patient Details Name: Evan [name] MRN: [id number] DOB: 1940/06/10   Cancelled treatment:       Reason Eval/Treat Not Completed: SLP screened, no needs identified, will sign off. Orders received for swallow evaluation. Upon chart review, note that he has passed the RN stroke swallow screen and has been initiated on regular textures and thin liquids. Per RN, pt ate a [employer] biscuit and a tomato this morning with no overt signs of difficulty. Will defer swallow evaluation at this time. Please re-order SLP if needed.   Germain Osgood 08/25/2015, 9:06 AM  Germain Osgood, M.A. CCC-SLP [phone]

## 2015-08-25 NOTE — Evaluation (Signed)
Physical Therapy Evaluation/Discharge Patient Details Name: Evan [name] MRN: [id number] DOB: 1940/05/04 Today's Date: 08/25/2015   History of Present Illness  75 y.o. male with medical history significant of CAD, s/p of CABG, carotid artery stenosis, s/p of left endarterectomy 2014, poor hearing, A fib on Xarelto, hypertension, hyperlipidemia, diabetes mellitus, who presents with blurry vision x 2 weeks. 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 Minor involvement of the lateral thalamus  Clinical Impression  Pt was able to get up and walk around the RN unit with only mild balance deficits.  This may be close to his baseline as he and his daughter report he uses a walking "stick" at home.  DGI was 20/24 and even with some small staggers in the hall he did not need external assist to correct.  PT to sign off as he has no acute or f/u PT needs at this time.    Follow Up Recommendations No PT follow up    Equipment Recommendations  None recommended by PT    Recommendations for Other Services   NA    Precautions / Restrictions Precautions Precautions: Fall Precaution Comments: Pt is mildly unsteady on his feet which is intensified by his need to frequently attempt to urinate and multiple lines Restrictions Weight Bearing Restrictions: No      Mobility  Bed Mobility              General bed mobility comments: Pt OOB in the recliner chair.  Transfers Overall transfer level: Independent Equipment used: None             General transfer comment: Sit to stand tansitions controlled with hands from low recliner chair.   Ambulation/Gait Ambulation/Gait assistance: Supervision Ambulation Distance (Feet): 500 Feet Assistive device: None Gait Pattern/deviations: Step-through pattern;Staggering left;Staggering right Gait velocity: decreased   General  Gait Details: very mildly staggering gait pattern while multi tasking.  Pt did not need external physical assist to correct.   Stairs Stairs: Yes Stairs assistance: Supervision Stair Management: One rail Right;Step to pattern;Forwards Number of Stairs: 3 General stair comments: Pt must have rail and he always leads with his right foot (not due to pain or weakness on the left, just out of habit).  Safe with railing and step to pattern.      Modified Rankin (Stroke Patients Only) Modified Rankin (Stroke Patients Only) Pre-Morbid Rankin Score: No symptoms Modified Rankin: Moderate disability     Balance  Sitting-balance support: Feet supported;No upper extremity supported Sitting balance-Leahy Scale: Good     Standing balance support: No upper extremity supported Standing balance-Leahy Scale: Good                   Standardized Balance Assessment Standardized Balance Assessment : Dynamic Gait Index   Dynamic Gait Index Level Surface: Normal Change in Gait Speed: Normal Gait with Horizontal Head Turns: Mild Impairment Gait with Vertical Head Turns: Normal Gait and Pivot Turn: Mild Impairment Step Over Obstacle: Mild Impairment Step Around Obstacles: Normal Steps: Mild Impairment Total Score: 20       Pertinent Vitals/Pain Pain Assessment:  (some discomfort from a full bladder)    Home Living Family/patient expects to be discharged to:: Private residence Living Arrangements: Spouse/significant other Available Help at Discharge: Family;Available 24 hours/day Type of Home: House Home Access: Other (comment) (per daughter they avoid the stairs)       Home Equipment: Kasandra Knudsen -  single point (per daughter it is a walking "stick")      Prior Function Level of Independence: Independent               Hand Dominance   Dominant Hand: Right    Extremity/Trunk Assessment   Upper Extremity Assessment: Defer to OT evaluation           Lower Extremity  Assessment: Overall WFL for tasks assessed (5/5 seated MMT, heel to shin WNL, no sensory deficits report)      Cervical / Trunk Assessment: Normal  Communication   Communication: HOH  Cognition Arousal/Alertness: Awake/alert Behavior During Therapy: WFL for tasks assessed/performed Overall Cognitive Status: Within Functional Limits for tasks assessed                      General Comments      Exercises        Assessment/Plan    PT Assessment Patent does not need any further PT services  PT Diagnosis Difficulty walking;Other (comment) (vision changes)   PT Problem List    PT Treatment Interventions     PT Goals (Current goals can be found in the Care Plan section) Acute Rehab PT Goals Patient Stated Goal: to be able to go on vacation in [place] this weekend with family PT Goal Formulation: All assessment and education complete, DC therapy     End of Session Equipment Utilized During Treatment: Gait belt Activity Tolerance: Patient tolerated treatment well Patient left: in chair;with call bell/phone within reach;with chair alarm set;with family/visitor present Nurse Communication: Mobility status    Functional Assessment Tool Used: assist level Functional Limitation: Mobility: Walking and moving around Mobility: Walking and Moving Around Current Status [phone]): At least 1 percent but less than 20 percent impaired, limited or restricted Mobility: Walking and Moving Around Goal Status [phone]): At least 1 percent but less than 20 percent impaired, limited or restricted Mobility: Walking and Moving Around Discharge Status [phone]): At least 1 percent but less than 20 percent impaired, limited or restricted    Time: 1203-1229 PT Time Calculation (min) (ACUTE ONLY): 26 min   Charges:   PT Evaluation $PT Eval Moderate Complexity: 1 Procedure PT Treatments $Gait Training: 8-22 mins   PT G Codes:   PT G-Codes **NOT FOR INPATIENT CLASS** Functional Assessment  Tool Used: assist level Functional Limitation: Mobility: Walking and moving around Mobility: Walking and Moving Around Current Status [id number]): At least 1 percent but less than 20 percent impaired, limited or restricted Mobility: Walking and Moving Around Goal Status [phone]): At least 1 percent but less than 20 percent impaired, limited or restricted Mobility: Walking and Moving Around Discharge Status [phone]): At least 1 percent but less than 20 percent impaired, limited or restricted    Annalaya Wile B. Dorie Ohms, PT, DPT [phone]   08/25/2015, 1:40 PM

## 2015-08-25 NOTE — Progress Notes (Signed)
Patient reports eating a [employer] egg biscuit and most of a tomato without difficulty.  Patient to bathroom x 2 this morning feeling like he needs to have a BM, but only passing gas.

## 2015-08-26 ENCOUNTER — Telehealth: Payer: Self-pay | Admitting: Cardiology

## 2015-08-26 LAB — CBC
HEMATOCRIT: 42.1 % (ref 39.0–52.0)
Hemoglobin: 13.5 g/dL (ref 13.0–17.0)
MCH: 30 pg (ref 26.0–34.0)
MCHC: 32.1 g/dL (ref 30.0–36.0)
MCV: 93.6 fL (ref 78.0–100.0)
PLATELETS: 192 10*3/uL (ref 150–400)
RBC: 4.5 MIL/uL (ref 4.22–5.81)
RDW: 13.5 % (ref 11.5–15.5)
WBC: 8.4 10*3/uL (ref 4.0–10.5)

## 2015-08-26 LAB — BASIC METABOLIC PANEL
Anion gap: 9 (ref 5–15)
BUN: 12 mg/dL (ref 6–20)
CALCIUM: 9.3 mg/dL (ref 8.9–10.3)
CO2: 27 mmol/L (ref 22–32)
CREATININE: 1.03 mg/dL (ref 0.61–1.24)
Chloride: 102 mmol/L (ref 101–111)
Glucose, Bld: 140 mg/dL — ABNORMAL HIGH (ref 65–99)
Potassium: 3.5 mmol/L (ref 3.5–5.1)
Sodium: 138 mmol/L (ref 135–145)

## 2015-08-26 LAB — HEMOGLOBIN A1C
Hgb A1c MFr Bld: 6.7 % — ABNORMAL HIGH (ref 4.8–5.6)
Mean Plasma Glucose: 146 mg/dL

## 2015-08-26 LAB — GLUCOSE, CAPILLARY: Glucose-Capillary: 156 mg/dL — ABNORMAL HIGH (ref 65–99)

## 2015-08-26 MED ORDER — APIXABAN 5 MG PO TABS: 5.0000 mg | ORAL_TABLET | Freq: Two times a day (BID) | ORAL | 0 refills | Status: DC

## 2015-08-26 MED ORDER — ROSUVASTATIN CALCIUM 20 MG PO TABS: 20.0000 mg | ORAL_TABLET | Freq: Every day | ORAL | 0 refills | Status: DC

## 2015-08-26 MED ORDER — ASPIRIN 325 MG PO TBEC: 325.0000 mg | DELAYED_RELEASE_TABLET | Freq: Every day | ORAL | 0 refills | Status: AC

## 2015-08-26 NOTE — Discharge Summary (Signed)
Physician Discharge Summary  RAYNARD BIEGER [id number] DOB: Aug 11, 1940 DOA: 08/24/2015  PCP: Leonel Ramsay, MD  Admit date: 08/24/2015 Discharge date: 08/26/2015  Recommendations for Outpatient Follow-up:  1. Prescribed rosuvastatin on discharge. Patient reports stated intolerance but has not tried rosuvastatin before. 2. Patient will continue aspirin for 7 days on discharge through 09/02/2015 and then apixaban 08/25/2015 and on.  Discharge Diagnoses:  Principal Problem:   CVA (cerebral infarction) Active Problems:   Coronary artery disease   Hyperlipidemia   Hypertensive urgency   Obesity   Carotid arterial disease (HCC)   Diabetes mellitus ([place])   Atrial fibrillation ([place])   Diarrhea    Discharge Condition: stable   Diet recommendation: as tolerated   History of present illness:   Per brief narrative 08/25/2015  "75 year old gentleman history of diabetes type 2, atrial fibrillation on chronic anticoagulation, hyperlipidemia, coronary artery disease status post CABG, carotid arterial disease status post endarterectomy presented from ophthalmologist office with blurry vision and consents for an acute CVA. MRI head consistent with an acute CVA with hemorrhagic conversion."   Hospital Course:  Stroke in the right PCA infarct embolic secondary to known atrial fibrillation with resultant field cut on Left upper quadrantanopia - MRI  Subacute R PCA infarct with hemorrhagic transformation  - CTA head & Neck  Diffuse atherosclerosis. L CEA patent. Bil 30-40% stenosis at ICA origin. R VA occluded at origin. L VA w/ 50% stenosis at origin.  2D Echo  .EF 50-55%. No source of embolus - LDL 95 - HgbA1c 6.7 - SCDs ordered for VTE prophylaxis - Aspirin for 7 days and eliquis after that per neurology   Atrial Fibrillation - CHADS vasc score 5 - Aspirin 325 through 8/9 and then apixaban 5 mg BID  - Rate controlled   Carotid stenosis - S/p left CEA in 2014 - CTA head and  neck showed L CEA patent. Bil 30-40% stenosis at ICA origin.                  Hypertensive Urgency - BP 215/90 on presentation in setting of neurologic symptoms - BP 124/70 this am - Continue home meds   Hyperlipidemia - Patient apparently intolerant to statin but has not tried rosuvastatin before so we'll try this but if patient does not tolerate that then he can just continue his home medication zetia - LDL 95, goal < 70  Diabetes mellitus, controlled without complications  - [id number] 6.7, goal < 7.0 - Diet controlled    Signed:  Leisa Lenz, MD  [facility] 08/26/2015, 10:48 AM  Pager #: [phone]  Time spent in minutes: less than 30 minutes  Discharge Exam: Vitals:   08/26/15 0700 08/26/15 0716  BP:  124/70  Pulse:  62  Resp:  18  Temp: 98.6 F (37 C)    Vitals:   08/25/15 2357 08/26/15 0245 08/26/15 0700 08/26/15 0716  BP: (!) 133/55 (!) 147/70  124/70  Pulse: 66 64  62  Resp: 19 (!) 22  18  Temp: 98.5 F (36.9 C) 97.9 F (36.6 C) 98.6 F (37 C)   TempSrc: Oral Oral Oral   SpO2: 96% 93%  96%  Weight:      Height:        General: Pt is alert, follows commands appropriately, not in acute distress Cardiovascular: Regular rate and rhythm, S1/S2 +, no murmurs Respiratory: Clear to auscultation bilaterally, no wheezing, no crackles, no rhonchi Abdominal: Soft, non tender, non distended, bowel sounds +,  no guarding Extremities: no edema, no cyanosis, pulses palpable bilaterally DP and PT Neuro: Grossly nonfocal  Discharge Instructions  Discharge Instructions    Ambulatory referral to Neurology    Complete by:  As directed   Requesting an appointment in 2 months with Dr. Erlinda Hong       Medication List    STOP taking these medications   BAYER CONTOUR NEXT TEST test strip Generic drug:  glucose blood   rivaroxaban 20 MG Tabs tablet Commonly known as:  XARELTO     TAKE these medications   acetaminophen 500 MG tablet Commonly known as:   TYLENOL Take 500-1,000 mg by mouth every 6 (six) hours as needed for mild pain or moderate pain.   apixaban 5 MG Tabs tablet Commonly known as:  ELIQUIS Take 1 tablet (5 mg total) by mouth 2 (two) times daily. Start taking on:  09/03/2015   aspirin 325 MG EC tablet Take 1 tablet (325 mg total) by mouth daily.   benazepril 40 MG tablet Commonly known as:  LOTENSIN Take 1 tablet (40 mg total) by mouth daily.   ezetimibe 10 MG tablet Commonly known as:  ZETIA Take 1 tablet (10 mg total) by mouth daily. What changed:  when to take this   hydrochlorothiazide 25 MG tablet Commonly known as:  HYDRODIURIL Take 25 mg by mouth every morning.   hydrocortisone 2.5 % cream Apply topically. As directed   LANCETS ULTRA FINE Misc As directed   OVER THE COUNTER MEDICATION Replenex tablets: Take 1 tablet by mouth every morning (joint health)   rosuvastatin 20 MG tablet Commonly known as:  CRESTOR Take 1 tablet (20 mg total) by mouth daily.   tamsulosin 0.4 MG Caps capsule Commonly known as:  FLOMAX Take 1 capsule (0.4 mg total) by mouth daily after supper.   RA VITAMIN B-12 TR 1000 MCG Tbcr Generic drug:  Cyanocobalamin Take 1 tablet by mouth daily.   vitamin B-12 1000 MCG tablet Commonly known as:  CYANOCOBALAMIN Take 1,000 mcg by mouth daily.      Follow-up Information    Xu,Jindong, MD Follow up in 2 month(s).   Specialty:  Neurology Why:  [facility], office will call you with appt date and time Contact information: [place] [id number] [phone]        Leonel Ramsay, MD. Schedule an appointment as soon as possible for a visit in 1 week(s).   Specialty:  Infectious Diseases Contact information: [place] [address] [phone]            The results of significant diagnostics from this hospitalization (including imaging, microbiology, ancillary and laboratory) are listed below for reference.     Significant Diagnostic Studies: Ct Angio Head W Or Wo Contrast  Result Date: 08/25/2015 CLINICAL DATA:  Hemorrhagic subacute infarction in the right PCA territory. EXAM: CT ANGIOGRAPHY HEAD AND NECK TECHNIQUE: Multidetector CT imaging of the head and neck was performed using the standard protocol during bolus administration of intravenous contrast. Multiplanar CT image reconstructions and MIPs were obtained to evaluate the vascular anatomy. Carotid stenosis measurements (when applicable) are obtained utilizing NASCET criteria, using the distal internal carotid diameter as the denominator. CONTRAST:  80 cc Isovue 370 COMPARISON:  CT and MRI yesterday FINDINGS: CTA NECK Aortic arch: Aortic atherosclerosis.  No aneurysm or dissection. Right carotid system: Right common carotid artery shows atherosclerotic plaque but is widely patent to the bifurcation region. There is atherosclerotic plaque at  the ICA bulb region with minimal diameter of 5.5 mm. Compared to a more distal cervical ICA diameter of 5 mm, there is no stenosis. Upper cervical ICA is tortuous. Left carotid system: Common carotid artery shows atherosclerotic plaque. There is narrowing 1.5 cm proximal to the bifurcation with a minimal diameter of 5 mm. Compared to an expected diameter of 9 mm, this indicates a 40% common carotid stenosis. There is atherosclerotic disease beyond that stenosis. There has been previous carotid endarterectomy with a widely patent carotid bifurcation region. Proximal ICA shows atherosclerosis but no stenosis. Upper cervical ICA is tortuous but widely patent. Vertebral arteries:The left vertebral artery is dominant. There is 50% stenosis at the left vertebral artery origin. Beyond that, the vessel shows some atherosclerotic plaque but no flow-limiting stenosis through the cervical region. The right vertebral artery is occluded proximally and reconstitutes in the midcervical region by cervical collaterals. This is a small  vessel that does show antegrade flow beyond that point. Skeleton: Ordinary cervical spondylosis. Other neck: No mass or lymphadenopathy.  Lung apices are clear. CTA HEAD Anterior circulation: Both internal carotid arteries are patent through the siphon region. There is atherosclerotic calcification throughout both carotid siphon regions with stenosis in that segment estimated at 30-40% bilaterally. Supraclinoid internal carotid arteries are widely patent. Anterior and middle cerebral vessels are patent bilaterally without proximal stenosis, aneurysm or vascular malformation. Posterior circulation: Both vertebral arteries are patent at the foramen magnum level with the left being dominant. There is atherosclerotic calcification but no stenosis greater than 20%. Both vessels give supply to the basilar. No basilar stenosis. Posterior circulation branch vessels are all patent proximally, but there are some missing branch vessels of the right PCA compared to the left consistent with embolic occlusion. Venous sinuses: Patent and normal Anatomic variants: None significant Delayed phase: Enhancement in the subacute right PCA infarction region as also demonstrated at MRI. IMPRESSION: Aortic atherosclerosis. Atherosclerosis diffusely. Atherosclerosis at the right carotid bifurcation but no stenosis. Atherosclerosis of the left common carotid artery with stenosis 1.5 cm proximal to the bifurcation of 40%. Previous carotid endarterectomy on the left is widely patent. Atherosclerosis of both carotid siphon regions with stenosis estimated at 30-40% in that region bilaterally. No significant intracranial anterior circulation finding. 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 Dominant left vertebral artery is patent, but with 50% origin stenosis. Major posterior circulation branch vessels are patent, including the right PCA, but there  are not as many branches visible on the right as the left, consistent with PCA branch vessel embolic infarction. Electronically Signed   By: Nelson Chimes M.D.   On: 08/25/2015 08:45   Ct Head Wo Contrast  Result Date: 08/24/2015 CLINICAL DATA:  Stroke symptoms.  Blurry vision. EXAM: CT HEAD WITHOUT CONTRAST TECHNIQUE: Contiguous axial images were obtained from the base of the skull through the vertex without intravenous contrast. COMPARISON:  None. FINDINGS: Ill-defined hypodensity in the right periventricular temporal occipital lobe with internal ill-defined hemorrhagic components measuring 3.2 x 1.5 x 1.6 cm. Edema and hemorrhage about the right tentorium without midline shift. There is mass effect on the occipital horn of the right lateral ventricle without definite intraventricular hemorrhage component. No hydrocephalus, ventricles are normal for age. Basilar cisterns are patent. There is atherosclerosis of skullbase vasculature. The calvarium is intact. Included paranasal sinuses are well aerated. Scattered opacification of lower left mastoid air cells. Small calcified sebaceous cyst in the left  frontal scalp. IMPRESSION: Hemorrhage with surrounding edema versus hemorrhagic mass involving the right occipital lobe. This causes mass effect on the right lateral ventricle but no midline shift. Recommend MRI without and with contrast for further characterization. Critical Value/emergent results were called by telephone at the time of interpretation on 08/24/2015 at 6:26 pm to Dr. Quintella Reichert , who verbally acknowledged these results. Electronically Signed   By: Jeb Levering M.D.   On: 08/24/2015 18:29   Ct Angio Neck W Or Wo Contrast  Result Date: 08/25/2015 CLINICAL DATA:  Hemorrhagic subacute infarction in the right PCA territory. EXAM: CT ANGIOGRAPHY HEAD AND NECK TECHNIQUE: Multidetector CT imaging of the head and neck was performed using the standard protocol during bolus administration of  intravenous contrast. Multiplanar CT image reconstructions and MIPs were obtained to evaluate the vascular anatomy. Carotid stenosis measurements (when applicable) are obtained utilizing NASCET criteria, using the distal internal carotid diameter as the denominator. CONTRAST:  80 cc Isovue 370 COMPARISON:  CT and MRI yesterday FINDINGS: CTA NECK Aortic arch: Aortic atherosclerosis.  No aneurysm or dissection. Right carotid system: Right common carotid artery shows atherosclerotic plaque but is widely patent to the bifurcation region. There is atherosclerotic plaque at the ICA bulb region with minimal diameter of 5.5 mm. Compared to a more distal cervical ICA diameter of 5 mm, there is no stenosis. Upper cervical ICA is tortuous. Left carotid system: Common carotid artery shows atherosclerotic plaque. There is narrowing 1.5 cm proximal to the bifurcation with a minimal diameter of 5 mm. Compared to an expected diameter of 9 mm, this indicates a 40% common carotid stenosis. There is atherosclerotic disease beyond that stenosis. There has been previous carotid endarterectomy with a widely patent carotid bifurcation region. Proximal ICA shows atherosclerosis but no stenosis. Upper cervical ICA is tortuous but widely patent. Vertebral arteries:The left vertebral artery is dominant. There is 50% stenosis at the left vertebral artery origin. Beyond that, the vessel shows some atherosclerotic plaque but no flow-limiting stenosis through the cervical region. The right vertebral artery is occluded proximally and reconstitutes in the midcervical region by cervical collaterals. This is a small vessel that does show antegrade flow beyond that point. Skeleton: Ordinary cervical spondylosis. Other neck: No mass or lymphadenopathy.  Lung apices are clear. CTA HEAD Anterior circulation: Both internal carotid arteries are patent through the siphon region. There is atherosclerotic calcification throughout both carotid siphon regions  with stenosis in that segment estimated at 30-40% bilaterally. Supraclinoid internal carotid arteries are widely patent. Anterior and middle cerebral vessels are patent bilaterally without proximal stenosis, aneurysm or vascular malformation. Posterior circulation: Both vertebral arteries are patent at the foramen magnum level with the left being dominant. There is atherosclerotic calcification but no stenosis greater than 20%. Both vessels give supply to the basilar. No basilar stenosis. Posterior circulation branch vessels are all patent proximally, but there are some missing branch vessels of the right PCA compared to the left consistent with embolic occlusion. Venous sinuses: Patent and normal Anatomic variants: None significant Delayed phase: Enhancement in the subacute right PCA infarction region as also demonstrated at MRI. IMPRESSION: Aortic atherosclerosis. Atherosclerosis diffusely. Atherosclerosis at the right carotid bifurcation but no stenosis. Atherosclerosis of the left common carotid artery with stenosis 1.5 cm proximal to the bifurcation of 40%. Previous carotid endarterectomy on the left is widely patent. Atherosclerosis of both carotid siphon regions with stenosis estimated at 30-40% in that region bilaterally. No significant intracranial anterior circulation finding. Right vertebral artery occluded at  the origin but reconstituted in the midcervical region by collaterals with antegrade flow beyond that. Dominant left vertebral artery is patent, but with 50% origin stenosis. Major posterior circulation branch vessels are patent, including the right PCA, but there are not as many branches visible on the right as the left, consistent with PCA branch vessel embolic infarction. Electronically Signed   By: Nelson Chimes M.D.   On: 08/25/2015 08:45   Mr Brain W Or Wo Contrast  Result Date: 08/24/2015 CLINICAL DATA:  Headaches and blurred vision over the last 2 weeks. Abnormal eye exam and head CT.  EXAM: MRI HEAD WITHOUT AND WITH CONTRAST TECHNIQUE: Multiplanar, multiecho pulse sequences of the brain and surrounding structures were obtained without and with intravenous contrast. CONTRAST:  48mL MULTIHANCE GADOBENATE DIMEGLUMINE 529 MG/ML IV SOLN COMPARISON:  Head CT earlier same day. FINDINGS: There is subacute infarction in the right PCA territory affecting the posteromedial temporal lobe and occipital lobe, with considerable hemorrhagic transformation. Minor involvement of the lateral thalamus. This is consistent with the clinical history of 2 weeks duration of symptoms. No other acute infarction. The brainstem is normal. The cerebellum is normal. Cerebral hemispheres otherwise show mild chronic small-vessel ischemic change of the deep and subcortical white matter. No mass lesion. Mild edema in the region of the infarction with mild mass effect but no midline shift. No extra-axial collection. After contrast administration, there is gyriform enhancement in the region of infarction, also consistent with the subacute time frame. No hydrocephalus. No extra-axial fluid collection. No evidence of neoplastic mass lesion. Pituitary gland is normal. Sinuses are clear. IMPRESSION: Subacute infarction in the right PCA territory with hemorrhagic transformation and mild edema. Findings are consistent with the clinical time frame of 2 weeks ago. Electronically Signed   By: Nelson Chimes M.D.   On: 08/24/2015 21:00    Microbiology: Recent Results (from the past 240 hour(s))  MRSA PCR Screening     Status: None   Collection Time: 08/25/15  1:19 AM  Result Value Ref Range Status   MRSA by PCR NEGATIVE NEGATIVE Final     Labs: Basic Metabolic Panel:  Recent Labs Lab 08/24/15 1740 08/26/15 0410  NA 138 138  K 4.0 3.5  CL 105 102  CO2 23 27  GLUCOSE 122* 140*  BUN 16 12  CREATININE 1.05 1.03  CALCIUM 9.8 9.3   Liver Function Tests:  Recent Labs Lab 08/24/15 1740  AST 27  ALT 24  ALKPHOS 76   BILITOT 0.4  PROT 7.2  ALBUMIN 4.3   No results for input(s): LIPASE, AMYLASE in the last 168 hours. No results for input(s): AMMONIA in the last 168 hours. CBC:  Recent Labs Lab 08/24/15 1740 08/26/15 0410  WBC 6.8 8.4  NEUTROABS 4.1  --   HGB 14.5 13.5  HCT 43.8 42.1  MCV 95.2 93.6  PLT 203 192   Cardiac Enzymes: No results for input(s): CKTOTAL, CKMB, CKMBINDEX, TROPONINI in the last 168 hours. BNP: BNP (last 3 results) No results for input(s): BNP in the last 8760 hours.  ProBNP (last 3 results) No results for input(s): PROBNP in the last 8760 hours.  CBG:  Recent Labs Lab 08/25/15 0740 08/25/15 1153 08/25/15 1718 08/25/15 2106 08/26/15 0804  GLUCAP 153* 183* 117* 151* 156*

## 2015-08-26 NOTE — Telephone Encounter (Signed)
New message      Pt is in the hosp now.  He had a stroke.  Talk to the nurse about his blood thinner---hosp doctor want to change his blood thinner.  Wife want to make sure Dr [name] is ok with this change.

## 2015-08-26 NOTE — Telephone Encounter (Signed)
I am aware and saw patient in hospital and talked to wife.  [name] [name] MD, [facility]

## 2015-08-26 NOTE — Discharge Instructions (Signed)
Rosuvastatin Tablets What is this medicine? ROSUVASTATIN (roe SOO va sta tin) is known as a HMG-CoA reductase inhibitor or 'statin'. It lowers cholesterol and triglycerides in the blood. This drug may also reduce the risk of heart attack, stroke, or other health problems in patients with risk factors for heart disease. Diet and lifestyle changes are often used with this drug. This medicine may be used for other purposes; ask your health care provider or pharmacist if you have questions. What should I tell my health care provider before I take this medicine? They need to know if you have any of these conditions: -frequently drink alcoholic beverages -kidney disease -liver disease -muscle aches or weakness -other medical condition -an unusual or allergic reaction to rosuvastatin, other medicines, foods, dyes, or preservatives -pregnant or trying to get pregnant -breast-feeding How should I use this medicine? Take this medicine by mouth with a glass of water. Follow the directions on the prescription label. Do not cut, crush or chew this medicine. You can take this medicine with or without food. Take your doses at regular intervals. Do not take your medicine more often than directed. Talk to your pediatrician regarding the use of this medicine in children. While this drug may be prescribed for children as young as 26 years old for selected conditions, precautions do apply. Overdosage: If you think you have taken too much of this medicine contact a poison control center or emergency room at once. NOTE: This medicine is only for you. Do not share this medicine with others. What if I miss a dose? If you miss a dose, take it as soon as you can. Do not take 2 doses within 12 hours of each other. If there are less than 12 hours until your next dose, take only that dose. Do not take double or extra doses. What may interact with this medicine? Do not take this medicine with any of the following  medications: -herbal medicines like red yeast rice This medicine may also interact with the following medications: -alcohol -antacids containing aluminum hydroxide or magnesium hydroxide -cyclosporine -other medicines for high cholesterol -some medicines for HIV infection -warfarin This list may not describe all possible interactions. Give your health care provider a list of all the medicines, herbs, non-prescription drugs, or dietary supplements you use. Also tell them if you smoke, drink alcohol, or use illegal drugs. Some items may interact with your medicine. What should I watch for while using this medicine? Visit your doctor or health care professional for regular check-ups. You may need regular tests to make sure your liver is working properly. Tell your doctor or health care professional right away if you get any unexplained muscle pain, tenderness, or weakness, especially if you also have a fever and tiredness. Your doctor or health care professional may tell you to stop taking this medicine if you develop muscle problems. If your muscle problems do not go away after stopping this medicine, contact your health care professional. This medicine may affect blood sugar levels. If you have diabetes, check with your doctor or health care professional before you change your diet or the dose of your diabetic medicine. Avoid taking antacids containing aluminum, calcium or magnesium within 2 hours of taking this medicine. This drug is only part of a total heart-health program. Your doctor or a dietician can suggest a low-cholesterol and low-fat diet to help. Avoid alcohol and smoking, and keep a proper exercise schedule. Do not use this drug if you are pregnant or breast-feeding.  Serious side effects to an unborn child or to an infant are possible. Talk to your doctor or pharmacist for more information. What side effects may I notice from receiving this medicine? Side effects that you should report  to your doctor or health care professional as soon as possible: -allergic reactions like skin rash, itching or hives, swelling of the face, lips, or tongue -dark urine -fever -joint pain -muscle cramps, pain -redness, blistering, peeling or loosening of the skin, including inside the mouth -trouble passing urine or change in the amount of urine -unusually weak or tired -yellowing of the eyes or skin Side effects that usually do not require medical attention (report to your doctor or health care professional if they continue or are bothersome): -constipation -heartburn -nausea -stomach gas, pain, upset This list may not describe all possible side effects. Call your doctor for medical advice about side effects. You may report side effects to FDA at 1-800-FDA-1088. Where should I keep my medicine? Keep out of the reach of children. Store at room temperature between 20 and 25 degrees C (68 and 77 degrees F). Keep container tightly closed (protect from moisture). Throw away any unused medicine after the expiration date. NOTE: This sheet is a summary. It may not cover all possible information. If you have questions about this medicine, talk to your doctor, pharmacist, or health care provider.    2016, Elsevier/Gold Standard. (2014-06-26 13:33:08) Apixaban oral tablets What is this medicine? APIXABAN (a PIX a ban) is an anticoagulant (blood thinner). It is used to lower the chance of stroke in people with a medical condition called atrial fibrillation. It is also used to treat or prevent blood clots in the lungs or in the veins. This medicine may be used for other purposes; ask your health care provider or pharmacist if you have questions. What should I tell my health care provider before I take this medicine? They need to know if you have any of these conditions: -bleeding disorders -bleeding in the brain -blood in your stools (black or tarry stools) or if you have blood in your  vomit -history of stomach bleeding -kidney disease -liver disease -mechanical heart valve -an unusual or allergic reaction to apixaban, other medicines, foods, dyes, or preservatives -pregnant or trying to get pregnant -breast-feeding How should I use this medicine? Take this medicine by mouth with a glass of water. Follow the directions on the prescription label. You can take it with or without food. If it upsets your stomach, take it with food. Take your medicine at regular intervals. Do not take it more often than directed. Do not stop taking except on your doctor's advice. Stopping this medicine may increase your risk of a blot clot. Be sure to refill your prescription before you run out of medicine. Talk to your pediatrician regarding the use of this medicine in children. Special care may be needed. Overdosage: If you think you have taken too much of this medicine contact a poison control center or emergency room at once. NOTE: This medicine is only for you. Do not share this medicine with others. What if I miss a dose? If you miss a dose, take it as soon as you can. If it is almost time for your next dose, take only that dose. Do not take double or extra doses. What may interact with this medicine? This medicine may interact with the following: -aspirin and aspirin-like medicines -certain medicines for fungal infections like ketoconazole and itraconazole -certain medicines for seizures like  carbamazepine and phenytoin -certain medicines that treat or prevent blood clots like warfarin, enoxaparin, and dalteparin -clarithromycin -NSAIDs, medicines for pain and inflammation, like ibuprofen or naproxen -rifampin -ritonavir -St. John's wort This list may not describe all possible interactions. Give your health care provider a list of all the medicines, herbs, non-prescription drugs, or dietary supplements you use. Also tell them if you smoke, drink alcohol, or use illegal drugs. Some items  may interact with your medicine. What should I watch for while using this medicine? Notify your doctor or health care professional and seek emergency treatment if you develop breathing problems; changes in vision; chest pain; severe, sudden headache; pain, swelling, warmth in the leg; trouble speaking; sudden numbness or weakness of the face, arm, or leg. These can be signs that your condition has gotten worse. If you are going to have surgery, tell your doctor or health care professional that you are taking this medicine. Tell your health care professional that you use this medicine before you have a spinal or epidural procedure. Sometimes people who take this medicine have bleeding problems around the spine when they have a spinal or epidural procedure. This bleeding is very rare. If you have a spinal or epidural procedure while on this medicine, call your health care professional immediately if you have back pain, numbness or tingling (especially in your legs and feet), muscle weakness, paralysis, or loss of bladder or bowel control. Avoid sports and activities that might cause injury while you are using this medicine. Severe falls or injuries can cause unseen bleeding. Be careful when using sharp tools or knives. Consider using an Copy. Take special care brushing or flossing your teeth. Report any injuries, bruising, or red spots on the skin to your doctor or health care professional. What side effects may I notice from receiving this medicine? Side effects that you should report to your doctor or health care professional as soon as possible: -allergic reactions like skin rash, itching or hives, swelling of the face, lips, or tongue -signs and symptoms of bleeding such as bloody or black, tarry stools; red or dark-brown urine; spitting up blood or brown material that looks like coffee grounds; red spots on the skin; unusual bruising or bleeding from the eye, gums, or nose This list may not  describe all possible side effects. Call your doctor for medical advice about side effects. You may report side effects to FDA at 1-800-FDA-1088. Where should I keep my medicine? Keep out of the reach of children. Store at room temperature between 20 and 25 degrees C (68 and 77 degrees F). Throw away any unused medicine after the expiration date. NOTE: This sheet is a summary. It may not cover all possible information. If you have questions about this medicine, talk to your doctor, pharmacist, or health care provider.    2016, Elsevier/Gold Standard. (2012-09-14 11:59:24)

## 2015-08-26 NOTE — Care Management Note (Addendum)
Case Management Note  Patient Details  Name: ATRAYU BRICK MRN: [id number] Date of Birth: 1940/12/24  Subjective/Objective:   Patient is for dc to home today, will start eliquis on   8/10 , NCM informed Dr. Erlinda Hong and Dr. Charlies Silvers Eliquis needs prior auth at [id number].  Co pay for Eliquis is $20 if he goes to [facility] which is preferred,if uses nonpreferred pharmacy cost will be $38. Received call from Dr. Charlies Silvers, she states that she called in pre auth for eliquis and auth number is [id number].                 Action/Plan:   Expected Discharge Date:  08/27/15               Expected Discharge Plan:  Home/Self Care  In-House Referral:     Discharge planning Services  CM Consult  Post Acute Care Choice:    Choice offered to:     DME Arranged:    DME Agency:     HH Arranged:    HH Agency:     Status of Service:  Completed, signed off  If discussed at [employer] of Stay Meetings, dates discussed:    Additional Comments:  Zenon Mayo, RN 08/26/2015, 12:04 PM

## 2015-08-26 NOTE — Progress Notes (Signed)
Occupational Therapy Treatment Patient Details Name: Evan [name] MRN: [id number] DOB: 1940-07-11 Today's Date: 08/26/2015    History of present illness 75 y.o. male with medical history significant of CAD, s/p of CABG, carotid artery stenosis, s/p of left endarterectomy 2014, poor hearing, A fib on Xarelto, hypertension, hyperlipidemia, diabetes mellitus, who presents with blurry vision x 2 weeks. 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 Minor involvement of the lateral thalamus   OT comments  This 75 yo male admitted and found to have above presents to acute OT with goal met in this environment with pt self compensating for field cuts. Wife and pt are aware of his left superior field cuts bilaterally and that pt appears to be compensating well for said deficits. They are both aware that I have advised pt not to drive (pt states that he does not feel good about driving in town, but feels that he has enough common sense to be able [place]--I explained to him that I do not agree, that he needs to cleared by MD to drive). I have recommend that he have a full visual field assessment by eye doctor--but to wait at least a couple of weeks before doing this due to pt feels like his vision has gotten some better and with his CVA being hemorrhagic his vision may get better. At this time acute OT is signing off with follow recommended as OPOT.  Follow Up Recommendations  Outpatient OT;Other (comment) (Pt prefers [facility]--he needs a script to take with him)    Equipment Recommendations  None recommended by OT       Precautions / Restrictions Precautions Precautions: Fall Precaution Comments: mildly unsteady on feet--uses a walking stick at home (which we did not use here) Restrictions Weight Bearing Restrictions: No        Mobility Bed Mobility               General bed mobility comments: Pt sitting EOB upon my arrival  Transfers Overall transfer level: Independent                              Vision                 Additional Comments: Pt continues with left superior homonymous quandranopsia. Worked with him on scanning skills tabletop and up on wall. In sitting he compensated well on his own with turning paper, leaning more to the left and using his finger to keep up with where he was (there was increased time noted over normal for each task, but he was able to do them). In standing he tended to self compensate as well (turning body and head more to the left to see items in his upper left visual field). He has noted he sees better when there is more light and I acknowledge that this would help him compensate as well as making sure that items that he is having trouble seeing be contrasted more with other items--he reports that he has a clock at home that is difficult to see due to colors and he compensates for this by shining a light on it and he then can see it better. Only in the line bisection task did he have an erratic scanning pattern; however he was able to bisect each line  in the middle with only missing on line (this was on his right not left).          Cognition   Behavior During Therapy: WFL for tasks assessed/performed Overall Cognitive Status: Within Functional Limits for tasks assessed                                    Pertinent Vitals/ Pain       Pain Assessment: No/denies pain            Progress Toward Goals  OT Goals(current goals can now be found in the care plan section)  Progress towards OT goals: Goals met/education completed, patient discharged from [facility] Discharge plan remains appropriate       End of Session Equipment Utilized During Treatment:  (none)   Activity Tolerance Patient tolerated treatment well   Patient Left  with family/visitor present (sitting EOB as he was when I entered)   Nurse Communication  (Pt's wife wanted to know what MD saw him this morning--RN was not aware and not note in chart that I saw at time that I was treating him)        Time: 6484-7207 OT Time Calculation (min): 65 min  Charges: OT General Charges $OT Visit: 1 Procedure OT Treatments $Therapeutic Activity: 53-67 mins  Almon Register [phone] 08/26/2015, 10:38 AM

## 2015-08-26 NOTE — Telephone Encounter (Signed)
Left message for wife, will make dr [name] aware. According to the notes in the chart, neurology is wanting to change pt from xarelto to eliquis.

## 2015-08-26 NOTE — Progress Notes (Signed)
STROKE TEAM PROGRESS NOTE   SUBJECTIVE (INTERVAL HISTORY) He is at the side of the bed, eating breakfast. Wife also present. She ran into Dr. [name] in the cafe and he came to visit pt this am. Dr. [name] agrees with current plan for anticoagulation. Patient had trouble urinating yesterday - BP went up with catheter insertion yesterday. Improved today.   OBJECTIVE Temp:  [97.9 F (36.6 C)-98.6 F (37 C)] 98.6 F (37 C) (08/02 0700) Pulse Rate:  [58-94] 62 (08/02 0716) Cardiac Rhythm: Atrial fibrillation (08/02 0716) Resp:  [17-22] 18 (08/02 0716) BP: (124-225)/(25-97) 124/70 (08/02 0716) SpO2:  [93 %-96 %] 96 % (08/02 0716)  CBC:   Recent Labs Lab 08/24/15 1740 08/26/15 0410  WBC 6.8 8.4  NEUTROABS 4.1  --   HGB 14.5 13.5  HCT 43.8 42.1  MCV 95.2 93.6  PLT 203 [id number]    Basic Metabolic Panel:   Recent Labs Lab 08/24/15 1740 08/26/15 0410  NA 138 138  K 4.0 3.5  CL 105 102  CO2 23 27  GLUCOSE 122* 140*  BUN 16 12  CREATININE 1.05 1.03  CALCIUM 9.8 9.3    Lipid Panel:     Component Value Date/Time   CHOL 149 08/25/2015 0423   TRIG 73 08/25/2015 0423   HDL 39 (L) 08/25/2015 0423   CHOLHDL 3.8 08/25/2015 0423   VLDL 15 08/25/2015 0423   LDLCALC 95 08/25/2015 0423   HgbA1c:  Lab Results  Component Value Date   HGBA1C 6.7 (H) 08/25/2015   Urine Drug Screen: No results found for: LABOPIA, COCAINSCRNUR, LABBENZ, AMPHETMU, THCU, LABBARB    IMAGING Dr. Erlinda Hong has personally reviewed the radiological images below and agree with the radiology interpretations.  Ct Head Wo Contrast 08/24/2015 Hemorrhage with surrounding edema versus hemorrhagic mass involving the right occipital lobe. This causes mass effect on the right lateral ventricle but no midline shift.   Ct Angio Head & Neck W Or Wo Contrast 08/25/2015 Aortic atherosclerosis. Atherosclerosis diffusely. Atherosclerosis at the right carotid bifurcation but no stenosis. Atherosclerosis of the left common carotid  artery with stenosis 1.5 cm proximal to the bifurcation of 40%. Previous carotid endarterectomy on the left is widely patent. Atherosclerosis of both carotid siphon regions with stenosis estimated at 30-40% in that region bilaterally. No significant intracranial anterior circulation finding. 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 Dominant left vertebral artery is patent, but with 50% origin stenosis. Major posterior circulation branch vessels are patent, including the right PCA, but there are not as many branches visible on the right as the left, consistent with PCA branch vessel embolic infarction.   Mr Jeri Cos Or Wo Contrast 08/24/2015 Subacute infarction in the right PCA territory with hemorrhagic transformation and mild edema. Findings are consistent with the clinical time frame of 2 weeks ago.   TTE  - Left ventricle: The cavity size was normal. Wall thickness was increased in a pattern of severe LVH. Systolic function was normal. The estimated ejection fraction was in the range of 50% to 55%. Wall motion was normal; there were no regional wall motion abnormalities. The study is not technically sufficient to allow evaluation of LV diastolic function. - Aortic valve: Moderately calcified. Moderate stenosis. Trivial regurgitation. Mean gradient (S): 26 mm Hg. Peak gradient (S): 50 mm Hg. Valve area (VTI): 0.93 cm^2. Valve area (Vmax): 1.01 cm^2. Valve area (Vmean): 0.96 cm^2. - Mitral valve: Calcified annulus. Mildly  thickened leaflets . There was trivial regurgitation. - Left atrium: Severely dilated. - Systemic veins: The IVC measures >2.1 cm, but collapses >50%, suggesting an elevated RA pressure of 8 mmHg. Impressions:   Compared to a prior study in 2015, there is now moderate to severe aortic stenosis - mean gradient has increased from 21 to 26 mmHg. A-fib is present in this study.   PHYSICAL  EXAM Temp:  [97.9 F (36.6 C)-98.6 F (37 C)] 98.6 F (37 C) (08/02 0700) Pulse Rate:  [58-94] 62 (08/02 0716) Resp:  [17-22] 18 (08/02 0716) BP: (124-225)/(25-97) 124/70 (08/02 0716) SpO2:  [93 %-96 %] 96 % (08/02 0716)  General - Well nourished, well developed, in no apparent distress.  Ophthalmologic - Fundi not visualized due to eye movement.  Cardiovascular - irregularly irregular heart rate and rhythm.  Mental Status -  Level of arousal and orientation to time, place, and person were intact. Language including expression, naming, repetition, comprehension was assessed and found intact. Fund of Knowledge was assessed and was intact.  Cranial Nerves II - XII - II - left upper quadrantanopia  III, IV, VI - Extraocular movements intact. V - Facial sensation intact bilaterally. VII - Facial movement intact bilaterally. VIII - Hearing & vestibular intact bilaterally. X - Palate elevates symmetrically. XI - Chin turning & shoulder shrug intact bilaterally. XII - Tongue protrusion intact.  Motor Strength - The patient's strength was normal in all extremities and pronator drift was absent.  Bulk was normal and fasciculations were absent.   Motor Tone - Muscle tone was assessed at the neck and appendages and was normal.  Reflexes - The patient's reflexes were 1+ in all extremities and he had no pathological reflexes.  Sensory - Light touch, temperature/pinprick were assessed and were symmetrical.    Coordination - The patient had normal movements in the hands and feet with no ataxia or dysmetria.  Tremor was absent.  Gait and Station - deferred.   ASSESSMENT/PLAN Evan [name] is a 75 y.o. male with history of CAD, s/p of CABG, carotid artery stenosis, s/p of left endarterectomy 2014, poor hearing, A fib on Xarelto, hypertension, hyperlipidemia, diabetes mellitus presenting with blurry vision. He did not receive IV t-PA due to out of the window, hemorrhagic converson.    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 L CEA patent. Bil 30-40% stenosis at ICA origin. R VA occluded at origin. L VA w/ 50% stenosis at origin.   2D Echo  .EF 50-55%. No source of embolus   LDL 95  HgbA1c 6.7  SCDs ordered for VTE prophylaxis Diet heart healthy/carb modified Room service appropriate? Yes; Fluid consistency: Thin  Xarelto (rivaroxaban) daily prior to admission, now on hold given hemorrhagic transformation. Recommend change to Eliquis (apixaban) daily bid. For now, start aspirin daily 325 with change to eliquis in 1 week.  Ongoing aggressive stroke risk factor management  Therapy recommendations:  No PT, OP OT   Disposition:   return home  Advised no driving for now given visual deficit  Plan follow up with Dr. Chucky May for family vacation this week in [place], [place] from stroke standpoint  Atrial Fibrillation  Home anticoagulation:  Xarelto (rivaroxaban) daily   Start aspirin 325 mg daily at this time  Plan change to eliquis in about  1 week  Dr. Erlinda Hong discussed with Dr. [name] who is ok with eliquis  Carotid stenosis  S/p left CEA in 2014  CTA head and neck showed L CEA patent. Bil 30-40% stenosis at ICA origin.  Has follow up with [facility] for monitoring   Hypertensive Urgency  BP 215/90 on presentation in setting of neurologic symptoms  BP stable today  On labetalol and hydralazine PRN  BP goal < 160 due to hemorrhagic transformation  Monitor BP at home daily, document  On lotensin, HCTZ, add amlodipine  Hyperlipidemia  Home meds:  No statin, on zetia 10 mg daily, resumed in hospital  Intolerant to atorvastatin and simvastatin due to myalgias in his leg  LDL 95, goal < 70  Continue zetia at discharge  Diabetes  HgbA1c 6.7, goal < 7.0  CBG  controlled  Other Stroke Risk Factors  Former smoker  Obesity, Body mass index is 32.89 kg/m., recommend weight loss, diet and exercise as appropriate   Coronary artery disease s/p CABG - follows with cardiology  Other Active Problems  BPH  Diarrhea, felt to be secondary to metformin  Hospital day # 1  Neurology will sign off. Please call with questions. Pt will follow up with Dr. Erlinda Hong at [facility] in about 2 months. Thanks for the consult.  Rosalin Hawking, MD PhD Stroke Neurology 08/27/2015 8:15 AM    To contact Stroke Continuity provider, please refer to [email or web address] After hours, contact General Neurology

## 2015-09-21 ENCOUNTER — Other Ambulatory Visit: Payer: Self-pay | Admitting: Cardiology

## 2015-09-29 ENCOUNTER — Other Ambulatory Visit: Payer: Self-pay | Admitting: Cardiology

## 2015-10-01 ENCOUNTER — Other Ambulatory Visit: Payer: Self-pay | Admitting: Cardiology

## 2015-10-07 ENCOUNTER — Encounter: Payer: Self-pay | Admitting: Cardiology

## 2015-10-14 ENCOUNTER — Encounter: Payer: Self-pay | Admitting: Neurology

## 2015-10-14 ENCOUNTER — Ambulatory Visit (INDEPENDENT_AMBULATORY_CARE_PROVIDER_SITE_OTHER): Payer: Medicare Other | Admitting: Neurology

## 2015-10-14 VITALS — BP 142/63 | HR 54 | Ht 72.0 in | Wt 257.6 lb

## 2015-10-14 DIAGNOSIS — E1159 Type 2 diabetes mellitus with other circulatory complications: Secondary | ICD-10-CM

## 2015-10-14 DIAGNOSIS — I10 Essential (primary) hypertension: Secondary | ICD-10-CM

## 2015-10-14 DIAGNOSIS — I639 Cerebral infarction, unspecified: Secondary | ICD-10-CM

## 2015-10-14 DIAGNOSIS — I63531 Cerebral infarction due to unspecified occlusion or stenosis of right posterior cerebral artery: Secondary | ICD-10-CM | POA: Diagnosis not present

## 2015-10-14 DIAGNOSIS — E785 Hyperlipidemia, unspecified: Secondary | ICD-10-CM

## 2015-10-14 DIAGNOSIS — I480 Paroxysmal atrial fibrillation: Secondary | ICD-10-CM

## 2015-10-14 DIAGNOSIS — E669 Obesity, unspecified: Secondary | ICD-10-CM

## 2015-10-14 DIAGNOSIS — I779 Disorder of arteries and arterioles, unspecified: Secondary | ICD-10-CM | POA: Diagnosis not present

## 2015-10-14 DIAGNOSIS — I739 Peripheral vascular disease, unspecified: Secondary | ICD-10-CM

## 2015-10-14 NOTE — Patient Instructions (Addendum)
-   continue eliquis and crestor for stroke prevention - follow up with Dr. [name] as scheduled. - Follow up with your primary care physician for stroke risk factor modification. Recommend maintain blood pressure goal <130/80, diabetes with hemoglobin A1c goal below 7.0% and lipids with LDL cholesterol goal below 70 mg/dL.  - check BP and glucose at home - recommend to follow up with eye doctor for glasses and visual field monitoring. - recommend no driving at this time and follow up with eye doctor - consider sleep study in the future.  - healthy diet and regular exercise - follow up in 3 months

## 2015-10-15 DIAGNOSIS — I10 Essential (primary) hypertension: Secondary | ICD-10-CM | POA: Insufficient documentation

## 2015-10-15 NOTE — Progress Notes (Signed)
STROKE NEUROLOGY FOLLOW UP NOTE  NAME: Evan [name] DOB: July 11, 1940  REASON FOR VISIT: stroke follow up HISTORY FROM: Patient and wife and chart  Today we had the pleasure of seeing Evan [name] in follow-up at our Neurology Clinic. Pt was accompanied by wife and daughter.   History Summary [name] is a 75 y.o. male with history of CAD, s/p of CABG, carotid artery stenosis, s/p of left CEA in 2014, poor hearing, A fibon Xarelto, hypertension, hyperlipidemia, diabetes mellitus was admitted on 08/24/2015 for blurry vision. 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 MRI showed subacute right PCA infarct with hemorrhagic transformation. CTA head and neck showed diffuse atherosclerosis without significant ICA stenosis. EF 50-55%. LDL 95 and A1c 6.7. He was started on aspirin initially due to hemorrhagic transformation, and then switched to Eliquis for stroke prevention.  Dr. [name], his cardiologist, also agreed with the plan. He has intolerance to Lipitor and Zocor in the past, he was continued on Zetia on discharge.   Interval History During the interval time, the patient has been do well. 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 Complaining of blurry vision which can be corrected by glasses. He is yet to see a ophthalmologist. Blood pressure at home 140-160, today in clinic 142/63. He is going to see Dr. [name] and PCP next week. He has symptoms and signs of OSA, however declined sleep study at this time. Started on Crestor 20 mg by PCP, so far tolerating well. Continued on eliquis without side effect.   REVIEW OF SYSTEMS: Full 14 system review of systems performed and notable only for those listed below and in HPI above, all others are negative:  Constitutional:  Excessive sweating Cardiovascular:  Ear/Nose/Throat:  Hearing loss Skin: Moles Eyes:  Loss of vision, blurry vision Respiratory:   Gastroitestinal:  Constipation and diarrhea Genitourinary: Frequency of  urination Hematology/Lymphatic:  Bruise/bleed easily Endocrine:  Musculoskeletal:  Joint pain, aching muscles, walking difficulty, neck stiffness Allergy/Immunology:   Neurological:   Psychiatric: Agitation, confusion, depression Sleep: Insomnia, daytime sleepiness  The following represents the patient's updated allergies and side effects list: Allergies  Allergen Reactions  . Niacin And Related Rash    Red face and burns.  . Metformin And Related Diarrhea  . Statins     Myalgias, weakness    The neurologically relevant items on the patient's problem list were reviewed on today's visit.  Neurologic Examination  A problem focused neurological exam (12 or more points of the single system neurologic examination, vital signs counts as 1 point, cranial nerves count for 8 points) was performed.  Blood pressure (!) 142/63, pulse (!) 54, height 6' (1.829 m), weight 257 lb 9.6 oz (116.8 kg).  General - Well nourished, well developed, in no apparent distress.  Ophthalmologic - Fundi not visualized due to eye movement.  Cardiovascular - Regular rate and rhythm with no murmur, not in A. fib.  Mental Status -  Level of arousal and orientation to time, place, and person were intact. Language including expression, naming, repetition, comprehension was assessed and found intact. Fund of Knowledge was assessed and was intact.  Cranial Nerves II - XII - II -  left upper quadrantanopia. III, IV, VI - Extraocular movements intact. V - Facial sensation intact bilaterally. VII - Facial movement intact bilaterally. VIII - Hearing & vestibular intact bilaterally. X - Palate elevates symmetrically. XI - Chin turning & shoulder shrug intact bilaterally. XII - Tongue protrusion intact.  Motor Strength - The  patient's strength was normal in all extremities and pronator drift was absent.  Bulk was normal and fasciculations were absent.   Motor Tone - Muscle tone was assessed at the neck and  appendages and was normal.  Reflexes - The patient's reflexes were 1+ in all extremities and he had no pathological reflexes.  Sensory - Light touch, temperature/pinprick were assessed and were normal.    Coordination - The patient had normal movements in the hands with no ataxia or dysmetria.  Tremor was absent.  Gait and Station - The patient's transfers, posture, gait, station, and turns were observed as normal.   Functional score  mRS = 2   0 - No symptoms.   1 - No significant disability. Able to carry out all usual activities, despite some symptoms.   2 - Slight disability. Able to look after own affairs without assistance, but unable to carry out all previous activities.   3 - Moderate disability. Requires some help, but able to walk unassisted.   4 - Moderately severe disability. Unable to attend to own bodily needs without assistance, and unable to walk unassisted.   5 - Severe disability. Requires constant nursing care and attention, bedridden, incontinent.   6 - Dead.   NIH Stroke Scale   Level Of Consciousness 0=Alert; keenly responsive 1=Not alert, but arousable by minor stimulation 2=Not alert, requires repeated stimulation 3=Responds only with reflex movements 0  LOC Questions to Month and Age 75=Answers both questions correctly 1=Answers one question correctly 2=Answers neither question correctly 0  LOC Commands      -Open/Close eyes     -Open/close grip 0=Performs both tasks correctly 1=Performs one task correctly 2=Performs neighter task correctly 0  Best Gaze 0=Normal 1=Partial gaze palsy 2=Forced deviation, or total gaze paresis 0  Visual 0=No visual loss 1=Partial hemianopia 2=Complete hemianopia 3=Bilateral hemianopia (blind including cortical blindness) 1  Facial Palsy 0=Normal symmetrical movement 1=Minor paralysis (asymmetry) 2=Partial paralysis (lower face) 3=Complete paralysis (upper and lower face) 0  Motor  0=No drift, limb holds  posture for full 10 seconds 1=Drift, limb holds posture, no drift to bed 2=Some antigravity effort, cannot maintain posture, drifts to bed 3=No effort against gravity, limb falls 4=No movement Right Arm 0     Leg 0    Left Arm 0     Leg 0  Limb Ataxia 0=Absent 1=Present in one limb 2=Present in two limbs 0  Sensory 0=Normal 1=Mild to moderate sensory loss 2=Severe to total sensory loss 0  Best Language 0=No aphasia, normal 1=Mild to moderate aphasia 2=Mute, global aphasia 3=Mute, global aphasia 0  Dysarthria 0=Normal 1=Mild to moderate 2=Severe, unintelligible or mute/anarthric 0  Extinction/Neglect 0=No abnormality 1=Extinction to bilateral simultaneous stimulation 2=Profound neglect 0  Total   1     Data reviewed: I personally reviewed the images and agree with the radiology interpretations.  Ct Head Wo Contrast 08/24/2015 Hemorrhage with surrounding edema versus hemorrhagic mass involving the right occipital lobe. This causes mass effect on the right lateral ventricle but no midline shift.   Ct Angio Head & Neck W Or Wo Contrast 08/25/2015 Aortic atherosclerosis. Atherosclerosis diffusely. Atherosclerosis at the right carotid bifurcation but no stenosis. Atherosclerosis of the left common carotid artery with stenosis 1.5 cm proximal to the bifurcation of 40%. Previous carotid endarterectomy on the left is widely patent. Atherosclerosis of both carotid siphon regions with stenosis estimated at 30-40% in that region bilaterally. No significant intracranial anterior circulation finding. 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 Dominant left vertebral artery is patent, but with 50% origin stenosis. Major posterior circulation branch vessels are patent, including the right PCA, but there are not as many branches visible on the right as the left, consistent with PCA branch vessel embolic  infarction.   Mr Jeri Cos Or Wo Contrast 08/24/2015 Subacute infarction in the right PCA territory with hemorrhagic transformation and mild edema. Findings are consistent with the clinical time frame of 2 weeks ago.   TTE  - Left ventricle: The cavity size was normal. Wall thickness wasincreased in a pattern of severe LVH. Systolic function wasnormal. The estimated ejection fraction was in the range of 50%to 55%. Wall motion was normal; there were no regional wallmotion abnormalities. The study is not technically sufficient toallow evaluation of LV diastolic function. - Aortic valve: Moderately calcified. Moderate stenosis. Trivialregurgitation. Mean gradient (S): 26 mm Hg. Peak gradient (S): 80mm Hg. Valve area (VTI): 0.93 cm^2. Valve area (Vmax): 1.01 cm^2.Valve area (Vmean): 0.96 cm^2. - Mitral valve: Calcified annulus. Mildly thickened leaflets . There was trivial regurgitation. - Left atrium: Severely dilated. - Systemic veins: The IVC measures >2.1 cm, but collapses >50%,suggesting an elevated RA pressure of 8 mmHg. Impressions:   Compared to a prior study in 2015, there is now moderate tosevere aortic stenosis - mean gradient has increased from 21 to26 mmHg. A-fib is present in this study.  Component     Latest Ref Rng & Units 08/25/2015  Cholesterol     0 - 200 mg/dL 149  Triglycerides     <150 mg/dL 73  HDL Cholesterol     >40 mg/dL 39 (L)  Total CHOL/HDL Ratio     RATIO 3.8  VLDL     0 - 40 mg/dL 15  LDL (calc)     0 - 99 mg/dL 95  Hemoglobin A1C     4.8 - 5.6 % 6.7 (H)  Mean Plasma Glucose     mg/dL 146    Assessment: As you may recall, he is a 75 y.o. Caucasian male with PMH of yesCAD, s/p of CABG, carotid artery stenosis, s/p of left CEA in 2014, poor hearing, A fibon Xarelto, hypertension, hyperlipidemia, diabetes mellitus was admitted on 08/24/2015 for blurry vision. 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 MRI showed subacute right PCA infarct with  hemorrhagic transformation. CTA head and neck showed diffuse atherosclerosis without significant ICA stenosis. EF 50-55%. LDL 95 and A1c 6.7. He was started on aspirin initially due to hemorrhagic transformation, and then switched to Eliquis for stroke prevention. He has symptoms and signs of OSA, however declined sleep study at this time. Started on Crestor 20 mg by PCP, so far tolerating well.   Plan:  - continue eliquis and crestor for stroke prevention - follow up with Dr. [name] as scheduled. - Follow up with your primary care physician for stroke risk factor modification. Recommend maintain blood pressure goal <130/80, diabetes with hemoglobin A1c goal below 7.0% and lipids with LDL cholesterol goal below 70 mg/dL.  - check BP and glucose at home - recommend to follow up with ophthalmology for glasses and visual field monitoring. - recommend no driving at this time and follow up with ophthalmology - consider sleep study in the future.  - healthy diet and regular exercise - follow up in 3 months  I spent more than 25 minutes of face to face time with the patient. Greater than 50%  of time was spent in counseling and coordination of care. We discussed OSA evaluation, follow up with eye doctor and compliant with medication.   No orders of the defined types were placed in this encounter.   No orders of the defined types were placed in this encounter.   Patient Instructions  - continue eliquis and crestor for stroke prevention - follow up with Dr. [name] as scheduled. - Follow up with your primary care physician for stroke risk factor modification. Recommend maintain blood pressure goal <130/80, diabetes with hemoglobin A1c goal below 7.0% and lipids with LDL cholesterol goal below 70 mg/dL.  - check BP and glucose at home - recommend to follow up with eye doctor for glasses and visual field monitoring. - recommend no driving at this time and follow up with eye doctor - consider sleep  study in the future.  - healthy diet and regular exercise - follow up in 3 months    Rosalin Hawking, MD PhD [facility] [address], [place], [place] [address] [phone]

## 2015-10-21 DIAGNOSIS — E119 Type 2 diabetes mellitus without complications: Secondary | ICD-10-CM | POA: Insufficient documentation

## 2015-10-21 NOTE — Progress Notes (Signed)
Evan [name] Date of Birth: 1940-03-14   History of Present Illness: Evan [name] is seen today for evaluation of edema. He has a history of coronary disease and is status post coronary bypass surgery in 2005. He had a cardiac catheterization 2007 which demonstrated his grafts were patent. He had a nuclear stress test in January of 2013 which demonstrated no significant perfusion abnormalities. His ejection fraction was estimated 46% but visually it appeared normal. He underwent a left carotid endarterectomy in May 2014. He is intolerant to statins due to severe myalgias and weakness.  He has permanent Atrial fibrillation for the past 2 years. Due to his slow HR metoprolol dose was stopped.  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 His anticoagulation was held and he was treated with ASA before later starting on Eliquis. (was on Xarelto before). His wife notes his BP has been running [id number] systolic. Was prescribed amlodipine 2.5 mg yesterday by Dr. Ola Spurr as well as glimiperide for DM with A1c of 7.3%. He is now on crestor for cholesterol. He does note some joint aches.   It was recommended that he have a sleep study following his CVA to rule out sleep apnea.  Current Outpatient Prescriptions on File Prior to Visit  Medication Sig Dispense Refill  . acetaminophen (TYLENOL) 500 MG tablet Take 500-1,000 mg by mouth every 6 (six) hours as needed for mild pain or moderate pain.    Marland Kitchen apixaban (ELIQUIS) 5 MG TABS tablet Take 1 tablet (5 mg total) by mouth 2 (two) times daily. 60 tablet 0  . benazepril (LOTENSIN) 40 MG tablet TAKE 1 TABLET(40 MG) BY MOUTH DAILY 30 tablet 1  . Cyanocobalamin (RA VITAMIN B-12 TR) 1000 MCG TBCR Take 2 tablets by mouth daily.     . hydrochlorothiazide (HYDRODIURIL) 25 MG tablet Take 25 mg by mouth every morning.     . hydrocortisone 2.5 % cream Apply topically. As directed    . LANCETS ULTRA FINE  MISC As directed    . OVER THE COUNTER MEDICATION Replenex tablets: Take 3 tablet by mouth every morning (joint health)    . rosuvastatin (CRESTOR) 20 MG tablet Take 1 tablet (20 mg total) by mouth daily. 30 tablet 0  . tamsulosin (FLOMAX) 0.4 MG CAPS capsule TAKE 1 CAPSULE BY MOUTH ONCE DAILY 90 capsule 3  . vitamin B-12 (CYANOCOBALAMIN) 1000 MCG tablet Take 1,000 mcg by mouth daily.    Marland Kitchen ZETIA 10 MG tablet Take 1 tablet (10 mg total) by mouth daily. 30 tablet 11   No current facility-administered medications on file prior to visit.     Allergies  Allergen Reactions  . Niacin And Related Rash    Red face and burns.  . Metformin And Related Diarrhea  . Statins     Myalgias, weakness    Past Medical History:  Diagnosis Date  . Bruises easily   . Carotid arterial disease ([place])   . Coronary artery disease   . Diabetes mellitus   . Dyspnea    with exertion  . Frequency of urination   . Heart murmur   . HOH (hard of hearing)   . Hyperlipidemia   . Hypertension   . Obesity   . Osteoarthritis    Left wrist  . Stroke ([place])   . Torn rotator cuff 2012    Right arm    Past Surgical History:  Procedure Laterality Date  .  BACK SURGERY     X2  . CARDIAC CATHETERIZATION  11/29/2005   SEVERE THREE VESSEL OBSTRUCTIVE ATHERSCLEROTIC CAD. ALL GRAFTS WERE PATENT. EF 65%  . CAROTID ENDARTERECTOMY Left 05-28-12   cea  . CAROTID ENDARTERECTOMY Right 01-29-08   cea  . CORONARY ARTERY BYPASS GRAFT  03/2003   X3. LIMA GRAFT TO THE LAD, SAPHENOUS VEIN GRAFT TO THE PA, AND A LEFT RADIAL GRAFT TO THEOBTUSE MARGINAL VESSEL  . ENDARTERECTOMY Left 05/28/2012   Procedure: ENDARTERECTOMY CAROTID;  Surgeon: Elam Dutch, MD;  Location: [facility];  Service: Vascular;  Laterality: Left;  . MOLE SURGERY    . PATCH ANGIOPLASTY Left 05/28/2012   Procedure: WITH dACRON PATCH ANGIOPLASTY;  Surgeon: Elam Dutch, MD;  Location: [facility];  Service: Vascular;  Laterality: Left;    History  Smoking Status  .  Former Smoker  . Quit date: 01/24/1994  Smokeless Tobacco  . Former User    History  Alcohol Use No    Family History  Problem Relation Age of Onset  . Heart attack Father   . Heart attack Brother   . Parkinsonism Brother   . Cancer Brother   . Arrhythmia Brother     Review of Systems: As noted in HPI. All other systems were reviewed and are negative.  Physical Exam: BP 139/66   Pulse (!) 52   Ht 6' (1.829 m)   Wt 258 lb 12.8 oz (117.4 kg)   SpO2 95%   BMI 35.10 kg/m  He is an obese white male in no acute distress. He is normocephalic, atraumatic. Pupils equal round react to light accommodation. Extraocular movements are full. Oropharynx is clear. Neck is without JVD, adenopathy, thyromegaly, or bruits. Lungs reveal scattered wheezes. Cardiac exam reveals an IRR and grade 2/6 systolic ejection murmur the right upper sternal border radiating to the left sternal border. There is no diastolic murmur or S3. Abdomen is obese, soft, nontender. He has no masses or bruits. He has no edema. Femoral and pedal pulses are good.  LABORATORY DATA: Lab Results  Component Value Date   WBC 8.4 08/26/2015   HGB 13.5 08/26/2015   HCT 42.1 08/26/2015   PLT 192 08/26/2015   GLUCOSE 140 (H) 08/26/2015   CHOL 149 08/25/2015   TRIG 73 08/25/2015   HDL 39 (L) 08/25/2015   LDLCALC 95 08/25/2015   ALT 24 08/24/2015   AST 27 08/24/2015   NA 138 08/26/2015   K 3.5 08/26/2015   CL 102 08/26/2015   CREATININE 1.03 08/26/2015   BUN 12 08/26/2015   CO2 27 08/26/2015   INR 1.20 08/24/2015   HGBA1C 6.7 (H) 08/25/2015   Ecg today shows AFib with slow ventricular response. Rate 54. LAFB and RBBB.  Echo 08/25/15: Study Conclusions  - Left ventricle: The cavity size was normal. Wall thickness was   increased in a pattern of severe LVH. Systolic function was   normal. The estimated ejection fraction was in the range of 50%   to 55%. Wall motion was normal; there were no regional wall   motion  abnormalities. The study is not technically sufficient to   allow evaluation of LV diastolic function. - Aortic valve: Moderately calcified. Moderate stenosis. Trivial   regurgitation. Mean gradient (S): 26 mm Hg. Peak gradient (S): 50   mm Hg. Valve area (VTI): 0.93 cm^2. Valve area (Vmax): 1.01 cm^2.   Valve area (Vmean): 0.96 cm^2. - Mitral valve: Calcified annulus. Mildly thickened leaflets .  There was trivial regurgitation. - Left atrium: Severely dilated. - Systemic veins: The IVC measures >2.1 cm, but collapses >50%,   suggesting an elevated RA pressure of 8 mmHg.  Impressions:  - Compared to a prior study in 2015, there is now moderate to   severe aortic stenosis - mean gradient has increased from 21 to   26 mmHg. A-fib is present in this study.  Assessment / Plan: 1. Coronary disease status post CABG in 2005. He has stable class I-2 anginal symptoms. His Myoview study 2013 showed no significant ischemia. We'll continue with his medical therapy including aspirin, metoprolol, and ACE inhibitor. Now on statin therapy.   2. Hyperlipidemia, intolerant to statins in the past. Now on Zetia and Crestor. Will repeat labs in 2 months. Monitor for worsening arthralgias.  3. Diabetes mellitus type 2. He will continue Amaryl.  Managed by primary care.   4. Hypertension - agree with addition of low dose amlodipine.   5. History of Trifascicular block.  On no rate slowing meds. Now with AFib and slow ventricular response. Holter monitor in Feb 2017 showed AFib with controlled rate. Some bradycardia while sleeping. Longest pause 3.3 seconds. No clear indication for pacemaker.   6. Carotid arterial disease status post left endarterectomy. Carotid dopplers in Dec 2015 showed less than 50% stenosis. 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 CTA of head and neck in August showed patent LCEA and occluded right vertebral.   7. Atrial fibrillation- now permanent with slow rate.  Continue Eliquis  5 mg bid. Avoid NSAIDs.  On no rate slowing medication due to bradycardia.   8. Edema. None seen on exam today. I think this is mostly related to salt intake and keeping feet down more at night. Recommend sodium restriction and elevation of feet.  9. 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 Now on Eliquis.   10. ? OSA. Will arrange for overnight oximetry.

## 2015-10-22 ENCOUNTER — Encounter: Payer: Self-pay | Admitting: Cardiology

## 2015-10-22 ENCOUNTER — Ambulatory Visit (INDEPENDENT_AMBULATORY_CARE_PROVIDER_SITE_OTHER): Payer: Medicare Other | Admitting: Cardiology

## 2015-10-22 VITALS — BP 139/66 | HR 52 | Ht 72.0 in | Wt 258.8 lb

## 2015-10-22 DIAGNOSIS — I739 Peripheral vascular disease, unspecified: Secondary | ICD-10-CM

## 2015-10-22 DIAGNOSIS — I639 Cerebral infarction, unspecified: Secondary | ICD-10-CM

## 2015-10-22 DIAGNOSIS — I25709 Atherosclerosis of coronary artery bypass graft(s), unspecified, with unspecified angina pectoris: Secondary | ICD-10-CM | POA: Diagnosis not present

## 2015-10-22 DIAGNOSIS — E785 Hyperlipidemia, unspecified: Secondary | ICD-10-CM

## 2015-10-22 DIAGNOSIS — I480 Paroxysmal atrial fibrillation: Secondary | ICD-10-CM

## 2015-10-22 DIAGNOSIS — I209 Angina pectoris, unspecified: Secondary | ICD-10-CM

## 2015-10-22 DIAGNOSIS — I452 Bifascicular block: Secondary | ICD-10-CM

## 2015-10-22 DIAGNOSIS — I779 Disorder of arteries and arterioles, unspecified: Secondary | ICD-10-CM

## 2015-10-22 DIAGNOSIS — I10 Essential (primary) hypertension: Secondary | ICD-10-CM

## 2015-10-22 DIAGNOSIS — I63531 Cerebral infarction due to unspecified occlusion or stenosis of right posterior cerebral artery: Secondary | ICD-10-CM

## 2015-10-22 NOTE — Addendum Note (Signed)
Addended by: Kathyrn Lass on: 10/22/2015 01:11 PM   Modules accepted: Orders

## 2015-10-22 NOTE — Patient Instructions (Addendum)
Continue your current therapy  We will check overnight oximetry  Go ahead and start amlodipine for BP  We will check blood work in 2 months.

## 2015-11-04 ENCOUNTER — Telehealth: Payer: Self-pay | Admitting: Cardiology

## 2015-11-04 DIAGNOSIS — E785 Hyperlipidemia, unspecified: Secondary | ICD-10-CM

## 2015-11-04 NOTE — Telephone Encounter (Signed)
Returned call to patient's wife.Advised I will mail lab orders to have fasting lab ( bmet,lipid,hepatic panels ) in 11/2015.Our scheduler already faxed order to [facility] to have overnight oximetry.She will call [facility] to find out when it will be done.

## 2015-11-04 NOTE — Telephone Encounter (Signed)
Pt' wife calling regarding home health care not getting in touch with patient yet, also wants to know when he is to get lab work? pls call wife Jinny Sanders

## 2015-11-06 ENCOUNTER — Telehealth: Payer: Self-pay | Admitting: Cardiology

## 2015-11-06 NOTE — Telephone Encounter (Signed)
[facility] on referral for the patient.  They have received the referral and will be calling the patient.

## 2015-11-12 ENCOUNTER — Telehealth: Payer: Self-pay | Admitting: Cardiology

## 2015-11-12 NOTE — Telephone Encounter (Signed)
Spoke to patient's wife she stated they still have not heard from [facility] about over night oximetry.[facility] called they will deliver overnight oximetry this week.

## 2015-11-12 NOTE — Telephone Encounter (Signed)
New message      Calling to let the nurse know the home health care people did not come today to bring something to monitor patient's oxygen level overnight

## 2015-11-12 NOTE — Telephone Encounter (Signed)
Returned call to patient's wife.Spoke to [employer].She advised drivers are still out making deliveries.Advised if patient is not at home they will leave overnight oximetry on front door with directions attached.

## 2015-11-16 ENCOUNTER — Telehealth: Payer: Self-pay | Admitting: Cardiology

## 2015-11-16 DIAGNOSIS — I25709 Atherosclerosis of coronary artery bypass graft(s), unspecified, with unspecified angina pectoris: Secondary | ICD-10-CM

## 2015-11-16 NOTE — Telephone Encounter (Signed)
New message ° ° ° °Pt verbalized that she is returning call for rn  °

## 2015-11-16 NOTE — Telephone Encounter (Signed)
Returned call to patient's wife.She stated husband wore over night oximetry this past Fri night 11/02/15.Advised I will call back after Dr.Jordan reviews report.

## 2015-11-17 ENCOUNTER — Telehealth: Payer: Self-pay | Admitting: Cardiology

## 2015-11-17 NOTE — Telephone Encounter (Signed)
Returned call to [place] with [facility].She needs a order for DME home O2.for the over night oximetry.They will send report to [place].

## 2015-11-17 NOTE — Telephone Encounter (Signed)
See previous 11/17/15 telephone message.

## 2015-11-17 NOTE — Telephone Encounter (Signed)
She wants to know if Dr [name] would put an addendum to 10-22-15 note? He need to say pt does not have OSA,ordering oxygen for CAD and Dyspnea on exertion.

## 2015-11-17 NOTE — Telephone Encounter (Signed)
Returned call to Evan [name] with [employer].She stated overnight oximetry done 11/13/15 revealed O2 desaturation at night.O2 sat dropped to 89 over 1 hour.Pt does require O2 2L/min at night.She request Dr.Jordan dictate a addendum stating pt does not have OSA and requires O2 at night for sob and cad so medicare will approve O2. Report given to medical records to be scanned in pts chart.Message sent to [place].

## 2015-11-17 NOTE — Telephone Encounter (Signed)
New Message  Mandy call from [facility] call requesting to speak with RN. Leafy Ro states she would like to get an order for pt to receive night time oxygen. Leafy Ro states she is on epic if it would be easier to just out the order in and she can receive it. Please call back to discuss

## 2015-11-19 ENCOUNTER — Other Ambulatory Visit: Payer: Self-pay

## 2015-11-19 DIAGNOSIS — R7981 Abnormal blood-gas level: Secondary | ICD-10-CM

## 2015-11-19 NOTE — Telephone Encounter (Signed)
Want to discuss the sleep study that her husband need to do.  Also wan to know if she can go with him we he have the study.

## 2015-11-19 NOTE — Telephone Encounter (Signed)
Returned call to patient's wife.She stated husband has decided to have sleep study.Stated he wants her to stay with him.Advised I will send message to our scheduler and she can ask if you can stay when she schedules sleep study.Advised scheduler will call back with sleep study appointment.

## 2015-11-20 ENCOUNTER — Ambulatory Visit (HOSPITAL_BASED_OUTPATIENT_CLINIC_OR_DEPARTMENT_OTHER): Payer: Medicare Other | Attending: Cardiology | Admitting: Cardiovascular Disease

## 2015-11-20 VITALS — Ht 72.0 in | Wt 250.0 lb

## 2015-11-20 DIAGNOSIS — I4891 Unspecified atrial fibrillation: Secondary | ICD-10-CM | POA: Insufficient documentation

## 2015-11-20 DIAGNOSIS — E669 Obesity, unspecified: Secondary | ICD-10-CM | POA: Insufficient documentation

## 2015-11-20 DIAGNOSIS — Z7901 Long term (current) use of anticoagulants: Secondary | ICD-10-CM | POA: Insufficient documentation

## 2015-11-20 DIAGNOSIS — G4733 Obstructive sleep apnea (adult) (pediatric): Secondary | ICD-10-CM | POA: Diagnosis not present

## 2015-11-20 DIAGNOSIS — G4730 Sleep apnea, unspecified: Secondary | ICD-10-CM | POA: Diagnosis not present

## 2015-11-20 DIAGNOSIS — Z79899 Other long term (current) drug therapy: Secondary | ICD-10-CM | POA: Diagnosis not present

## 2015-11-20 DIAGNOSIS — Z7984 Long term (current) use of oral hypoglycemic drugs: Secondary | ICD-10-CM | POA: Insufficient documentation

## 2015-11-20 DIAGNOSIS — G4710 Hypersomnia, unspecified: Secondary | ICD-10-CM | POA: Insufficient documentation

## 2015-11-20 DIAGNOSIS — Z6836 Body mass index (BMI) 36.0-36.9, adult: Secondary | ICD-10-CM | POA: Insufficient documentation

## 2015-11-20 DIAGNOSIS — R7981 Abnormal blood-gas level: Secondary | ICD-10-CM

## 2015-11-20 DIAGNOSIS — G4761 Periodic limb movement disorder: Secondary | ICD-10-CM | POA: Diagnosis not present

## 2015-11-20 DIAGNOSIS — G4719 Other hypersomnia: Secondary | ICD-10-CM

## 2015-11-25 ENCOUNTER — Telehealth: Payer: Self-pay | Admitting: Cardiology

## 2015-11-25 NOTE — Telephone Encounter (Signed)
Returned call, advised results still in process. She voiced understanding & thanks, aware we will follow up when study results complete.

## 2015-11-25 NOTE — Telephone Encounter (Signed)
New message    Pt wife calling for rn to go over sleep study results that was done on friday

## 2015-11-27 NOTE — Procedures (Signed)
Patient Name: Evan [name], Evan [name] Date: 11/20/2015 Gender: Male D.O.B: 02-11-1940 Age (years): 75 Referring Provider: [name] M [name] Height (inches): 19 Interpreting Physician: Shelva Majestic MD, ABSM Weight (lbs): 250 RPSGT: Baxter Flattery BMI: 36 MRN: [id number] Neck Size: 18.00  CLINICAL INFORMATION Sleep Study Type: NPSG Indication for sleep study: Fatigue, Obesity, OSA, Snoring, Witnessed Apneas Epworth Sleepiness Score: 19 consistent with severe daytime sleepiness  SLEEP STUDY TECHNIQUE As per the AASM Manual for the Scoring of Sleep and Associated Events v2.3 (April 2016) with a hypopnea requiring 4% desaturations. The channels recorded and monitored were frontal, central and occipital EEG, electrooculogram (EOG), submentalis EMG (chin), nasal and oral airflow, thoracic and abdominal wall motion, anterior tibialis EMG, snore microphone, electrocardiogram, and pulse oximetry.  MEDICATIONS acetaminophen (TYLENOL) 500 MG tablet amLODipine (NORVASC) 2.5 MG tablet apixaban (ELIQUIS) 5 MG TABS tablet benazepril (LOTENSIN) 40 MG tablet Cyanocobalamin (RA VITAMIN B-12 TR) 1000 MCG TBCR glimepiride (AMARYL) 1 MG tablet hydrochlorothiazide (HYDRODIURIL) 25 MG tablet hydrocortisone 2.5 % cream LANCETS ULTRA FINE MISC OVER THE COUNTER MEDICATION rosuvastatin (CRESTOR) 20 MG tablet tamsulosin (FLOMAX) 0.4 MG CAPS capsule vitamin B-12 (CYANOCOBALAMIN) 1000 MCG tablet ZETIA 10 MG tablet  SLEEP ARCHITECTURE The study was initiated at 10:14:38 PM and ended at 4:59:09 AM. Sleep onset time was 44.7 minutes and the sleep efficiency was 30.2%. The total sleep time was 122.0 minutes.  Wake after sleep onset (WASO) was 237.5 minutes. Stage REM latency was N/A minutes. The patient spent 13.52% of the night in stage N1 sleep, 86.48% in stage N2 sleep, 0.00% in stage N3 and 0.00% in REM. Alpha intrusion was absent. Supine sleep was 0.00%.  RESPIRATORY PARAMETERS The overall  apnea/hypopnea index (AHI) was 4.9 per hour. There were 1 total apneas, including 1 obstructive, 0 central and 0 mixed apneas. There were 9 hypopneas and 9 RERAs. The AHI during Stage REM sleep was N/A per hour. AHI while supine was N/A per hour. The mean oxygen saturation was 91.88%. The minimum SpO2 during sleep was 88.00%. Moderate snoring was noted during this study.  CARDIAC DATA The 2 lead EKG demonstrated sinus rhythm. The mean heart rate was 52.09 beats per minute. Other EKG findings include: None.  LEG MOVEMENT DATA The total PLMS were 41 with a resulting PLMS index of 20.16. Associated arousal with leg movement index was 0.5 .  IMPRESSIONS - Increased Upper Airway Resistance/Borderline OSA with AHI 4.9/h; however, there was absence of both REM and supine sleep and markedly reduced sleep efficiency at 30.2% - No significant central sleep apnea occurred during this study (CAI = 0.0/h). - Mild oxygen desaturation to a nadir of 88.00%. - The patient snored with Moderate snoring volume. - Underlying atrial fibrillation and paced rhythm - Mild periodic limb movements of sleep occurred during the study. No significant associated arousals.  DIAGNOSIS - Increased Upper Airway Resistance/Borderline sleep apnea - Excessive Daytime Sleepiness - Periodic Limb Movement Disorder  RECOMMENDATIONS - Due to the poor sleep efficiency (30.2%) and absence of both supine and REM sleep, although significant sleep apnea was not detected in this symptomatic patient with cardiovascular comorbidities, the true severity of sleep disordered breathing is likely underestimated on this study.   - Efforts should be made to optimize nasal and oropharyngeal patency. - If patient continues to be symptomatic, would recommend a follow-up study with a sleep aid prior to the study to improve sleep efficiency and improved diagnostic potential. - At present consider alternatives to snoring. -  Avoid alcohol,  sedatives  and other CNS depressants that may worsen sleep apnea and disrupt normal sleep architecture. - Sleep hygiene should be reviewed to assess factors that may improve sleep quality. - Weight management (BMI 36) and regular exercise should be initiated or continued if appropriate.  [Electronically signed] 11/27/2015 06:23 PM  Shelva Majestic MD, [facility], ABSM Diplomate, American Board of Sleep Medicine   NPI: [id number]  [place] PH: [phone]   FX: [phone] [place]

## 2015-12-01 ENCOUNTER — Encounter (HOSPITAL_BASED_OUTPATIENT_CLINIC_OR_DEPARTMENT_OTHER): Payer: Medicare Other

## 2015-12-02 ENCOUNTER — Other Ambulatory Visit: Payer: Self-pay | Admitting: Cardiology

## 2015-12-02 MED ORDER — BENAZEPRIL HCL 40 MG PO TABS: 40.0000 mg | ORAL_TABLET | Freq: Every day | ORAL | 0 refills | Status: DC

## 2015-12-02 NOTE — Telephone Encounter (Signed)
°*  STAT* If patient is at the pharmacy, call can be transferred to refill team.   1. Which medications need to be refilled? (please list name of each medication and dose if known) Pt is out of town-please call [facility] in [place] Benazepril  2. Which pharmacy/location (including street and city if local pharmacy) is medication to be sent to?[facility] in [place] 3. Do they need a 30 day or 90 day supply? [place]

## 2015-12-02 NOTE — Telephone Encounter (Signed)
Rx has been sent to the pharmacy electronically. Benazepril 40 mg #30 0 refills

## 2015-12-03 ENCOUNTER — Other Ambulatory Visit: Payer: Self-pay

## 2015-12-03 MED ORDER — BENAZEPRIL HCL 40 MG PO TABS: 40.0000 mg | ORAL_TABLET | Freq: Every day | ORAL | 3 refills | Status: DC

## 2015-12-10 ENCOUNTER — Telehealth: Payer: Self-pay | Admitting: Cardiology

## 2015-12-10 NOTE — Telephone Encounter (Signed)
New Message  Pt voiced wanting nurse to return her call.  Please f/u

## 2015-12-10 NOTE — Telephone Encounter (Signed)
Medication Samples have been provided to the patient.  Drug name: Eliquis 5mg Qty: 56LOT: [id number].Date: 2/20  The patient has been instructed regarding the correct time, dose, and frequency of taking this medication, including desired effects and most common side effects.

## 2015-12-12 LAB — BASIC METABOLIC PANEL
BUN: 22 mg/dL (ref 7–25)
CALCIUM: 9 mg/dL (ref 8.6–10.3)
CO2: 27 mmol/L (ref 20–31)
CREATININE: 0.96 mg/dL (ref 0.70–1.18)
Chloride: 104 mmol/L (ref 98–110)
GLUCOSE: 146 mg/dL — AB (ref 65–99)
Potassium: 4.5 mmol/L (ref 3.5–5.3)
Sodium: 140 mmol/L (ref 135–146)

## 2015-12-12 LAB — HEPATIC FUNCTION PANEL
ALBUMIN: 4.1 g/dL (ref 3.6–5.1)
ALT: 19 U/L (ref 9–46)
AST: 22 U/L (ref 10–35)
Alkaline Phosphatase: 79 U/L (ref 40–115)
BILIRUBIN TOTAL: 1.1 mg/dL (ref 0.2–1.2)
Bilirubin, Direct: 0.3 mg/dL — ABNORMAL HIGH (ref <=0.2)
Indirect Bilirubin: 0.8 mg/dL (ref 0.2–1.2)
TOTAL PROTEIN: 6.4 g/dL (ref 6.1–8.1)

## 2015-12-12 LAB — LIPID PANEL
CHOL/HDL RATIO: 2.3 ratio (ref <5.0)
Cholesterol: 100 mg/dL (ref <200)
HDL: 44 mg/dL (ref >40)
LDL Cholesterol: 44 mg/dL (ref <100)
TRIGLYCERIDES: 59 mg/dL (ref <150)
VLDL: 12 mg/dL (ref <30)

## 2015-12-30 ENCOUNTER — Encounter: Payer: Self-pay | Admitting: Family

## 2016-01-05 ENCOUNTER — Telehealth: Payer: Self-pay | Admitting: Internal Medicine

## 2016-01-05 NOTE — Telephone Encounter (Signed)
Pt took Rosuvastatin 20 mg x 2 (40 mg) instead of 20 mg qhs. Wife concerned. Provided reassurance.   Allyson Sabal, MD

## 2016-01-08 ENCOUNTER — Telehealth: Payer: Self-pay | Admitting: Cardiology

## 2016-01-08 ENCOUNTER — Encounter: Payer: Self-pay | Admitting: Family

## 2016-01-08 ENCOUNTER — Ambulatory Visit: Payer: Medicare Other | Admitting: Family

## 2016-01-08 ENCOUNTER — Encounter (HOSPITAL_COMMUNITY): Payer: Medicare Other

## 2016-01-08 NOTE — Telephone Encounter (Signed)
Patient calling the office for samples of medication:   1.  What medication and dosage are you requesting samples for? ELIQUIS 5MG  2.  Are you currently out of this medication?  He das two days left

## 2016-01-08 NOTE — Telephone Encounter (Signed)
Left message for pt that we do not have samples of eliquis and to try back next week

## 2016-01-11 ENCOUNTER — Encounter: Payer: Self-pay | Admitting: Family

## 2016-01-11 ENCOUNTER — Telehealth: Payer: Self-pay | Admitting: Cardiology

## 2016-01-11 ENCOUNTER — Ambulatory Visit (INDEPENDENT_AMBULATORY_CARE_PROVIDER_SITE_OTHER): Payer: Medicare Other | Admitting: Family

## 2016-01-11 ENCOUNTER — Ambulatory Visit (HOSPITAL_COMMUNITY)
Admission: RE | Admit: 2016-01-11 | Discharge: 2016-01-11 | Disposition: A | Discharge status: Home / Self Care | Payer: Medicare Other | Source: Ambulatory Visit | Attending: Family | Admitting: Family

## 2016-01-11 VITALS — BP 126/53 | HR 56 | Temp 97.3°F | Resp 18 | Ht 72.0 in | Wt 267.7 lb

## 2016-01-11 DIAGNOSIS — Z48812 Encounter for surgical aftercare following surgery on the circulatory system: Secondary | ICD-10-CM | POA: Diagnosis not present

## 2016-01-11 DIAGNOSIS — I6523 Occlusion and stenosis of bilateral carotid arteries: Secondary | ICD-10-CM

## 2016-01-11 DIAGNOSIS — I639 Cerebral infarction, unspecified: Secondary | ICD-10-CM

## 2016-01-11 DIAGNOSIS — I6522 Occlusion and stenosis of left carotid artery: Secondary | ICD-10-CM | POA: Diagnosis not present

## 2016-01-11 DIAGNOSIS — Z9889 Other specified postprocedural states: Secondary | ICD-10-CM | POA: Insufficient documentation

## 2016-01-11 NOTE — Telephone Encounter (Signed)
Patient's wife notified that no samples are available. She states they can get script from the pharmacy.

## 2016-01-11 NOTE — Telephone Encounter (Signed)
°  New Prob   Patient calling the office for samples of medication:   1.  What medication and dosage are you requesting samples for? Eliquis 5 mg  2.  Are you currently out of this medication? No. Has 1 more tab left.

## 2016-01-11 NOTE — Patient Instructions (Signed)
Stroke Prevention Some medical conditions and behaviors are associated with an increased chance of having a stroke. You may prevent a stroke by making healthy choices and managing medical conditions. How can I reduce my risk of having a stroke?  Stay physically active. Get at least 30 minutes of activity on most or all days.  Do not smoke. It may also be helpful to avoid exposure to secondhand smoke.  Limit alcohol use. Moderate alcohol use is considered to be:  No more than 2 drinks per day for men.  No more than 1 drink per day for nonpregnant women.  Eat healthy foods. This involves:  Eating 5 or more servings of fruits and vegetables a day.  Making dietary changes that address high blood pressure (hypertension), high cholesterol, diabetes, or obesity.  Manage your cholesterol levels.  Making food choices that are high in fiber and low in saturated fat, trans fat, and cholesterol may control cholesterol levels.  Take any prescribed medicines to control cholesterol as directed by your health care provider.  Manage your diabetes.  Controlling your carbohydrate and sugar intake is recommended to manage diabetes.  Take any prescribed medicines to control diabetes as directed by your health care provider.  Control your hypertension.  Making food choices that are low in salt (sodium), saturated fat, trans fat, and cholesterol is recommended to manage hypertension.  Ask your health care provider if you need treatment to lower your blood pressure. Take any prescribed medicines to control hypertension as directed by your health care provider.  If you are 18-39 years of age, have your blood pressure checked every 3-5 years. If you are 40 years of age or older, have your blood pressure checked every year.  Maintain a healthy weight.  Reducing calorie intake and making food choices that are low in sodium, saturated fat, trans fat, and cholesterol are recommended to manage  weight.  Stop drug abuse.  Avoid taking birth control pills.  Talk to your health care provider about the risks of taking birth control pills if you are over 35 years old, smoke, get migraines, or have ever had a blood clot.  Get evaluated for sleep disorders (sleep apnea).  Talk to your health care provider about getting a sleep evaluation if you snore a lot or have excessive sleepiness.  Take medicines only as directed by your health care provider.  For some people, aspirin or blood thinners (anticoagulants) are helpful in reducing the risk of forming abnormal blood clots that can lead to stroke. If you have the irregular heart rhythm of atrial fibrillation, you should be on a blood thinner unless there is a good reason you cannot take them.  Understand all your medicine instructions.  Make sure that other conditions (such as anemia or atherosclerosis) are addressed. Get help right away if:  You have sudden weakness or numbness of the face, arm, or leg, especially on one side of the body.  Your face or eyelid droops to one side.  You have sudden confusion.  You have trouble speaking (aphasia) or understanding.  You have sudden trouble seeing in one or both eyes.  You have sudden trouble walking.  You have dizziness.  You have a loss of balance or coordination.  You have a sudden, severe headache with no known cause.  You have new chest pain or an irregular heartbeat. Any of these symptoms may represent a serious problem that is an emergency. Do not wait to see if the symptoms will go away.   Get medical help at once. Call your local emergency services (911 in [place] Do not drive yourself to the hospital. This information is not intended to replace advice given to you by your health care provider. Make sure you discuss any questions you have with your health care provider. Document Released: 02/18/2004 Document Revised: 06/18/2015 Document Reviewed: 07/13/2012 Elsevier  Interactive Patient Education  2017 [employer].  

## 2016-01-11 NOTE — Progress Notes (Signed)
Chief Complaint: Follow up Extracranial Carotid Artery Stenosis   History of Present Illness  Evan [name] is a 75 y.o. male patient of Dr. Oneida Alar who is status post left carotid endarterectomy on 05/28/12 and right CEA on 01/29/08. He returns today for follow up and carotid artery Duplex.  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 His Xarleto for atrial fib was changed to Eliquis at that time according to pt's wife. He reports " I sometimes slobber out of the right side of my mouth", and has loss of bilateral peripheral vision since this stroke. He has no hemiparesis, no speech difficulties. He remains hard of hearing.   He denies steal symptoms in either arm. He denies claudication symptoms in LE's, denies non-healing wounds.  Wife states that his walking is limited by his dyspnea.   ABI's requested by Dr. [name] and performed August 2014 reviewed, were normal.    Pt Diabetic: Yes, A1C June 2016 was 6.6, in control (review of records) Pt smoker: former smoker, quit in 1994  Pt meds include: Statin: Crestor started August 2017, had myalgias with previous statins  Betablocker: no, stopped due to bradycardia ASA: no Other anticoagulants/antiplatelets: Xarelto changed to Eliquis after the hemorrhagic stroke on 08-24-15   Past Medical History:  Diagnosis Date  . Bruises easily   . Carotid arterial disease ([place])   . Coronary artery disease   . Diabetes mellitus   . Dyspnea    with exertion  . Frequency of urination   . Heart murmur   . HOH (hard of hearing)   . Hyperlipidemia   . Hypertension   . Obesity   . Osteoarthritis    Left wrist  . Peripheral vision loss, bilateral   . Stroke ([place]) 07/2015  . Torn rotator cuff 2012    Right arm    Social History Social History  Substance Use Topics  . Smoking status: Former Smoker    Quit date: 01/24/1994  . Smokeless tobacco: Former Systems developer  . Alcohol use No     Family History Family History  Problem Relation Age of Onset  . Heart attack Father   . Heart attack Brother   . Parkinsonism Brother   . Cancer Brother   . Arrhythmia Brother     Surgical History Past Surgical History:  Procedure Laterality Date  . BACK SURGERY     X2  . CARDIAC CATHETERIZATION  11/29/2005   SEVERE THREE VESSEL OBSTRUCTIVE ATHERSCLEROTIC CAD. ALL GRAFTS WERE PATENT. EF 65%  . CAROTID ENDARTERECTOMY Left 05-28-12   cea  . CAROTID ENDARTERECTOMY Right 01-29-08   cea  . CORONARY ARTERY BYPASS GRAFT  03/2003   X3. LIMA GRAFT TO THE LAD, SAPHENOUS VEIN GRAFT TO THE PA, AND A LEFT RADIAL GRAFT TO THEOBTUSE MARGINAL VESSEL  . ENDARTERECTOMY Left 05/28/2012   Procedure: ENDARTERECTOMY CAROTID;  Surgeon: Elam Dutch, MD;  Location: [facility];  Service: Vascular;  Laterality: Left;  . MOLE SURGERY    . PATCH ANGIOPLASTY Left 05/28/2012   Procedure: WITH dACRON PATCH ANGIOPLASTY;  Surgeon: Elam Dutch, MD;  Location: [facility];  Service: Vascular;  Laterality: Left;    Allergies  Allergen Reactions  . Niacin And Related Rash    Red face and burns.  . Metformin And Related Diarrhea  . Statins     Myalgias, weakness    Current Outpatient Prescriptions  Medication Sig Dispense Refill  . acetaminophen (  TYLENOL) 500 MG tablet Take 500-1,000 mg by mouth every 6 (six) hours as needed for mild pain or moderate pain.    Marland Kitchen amLODipine (NORVASC) 2.5 MG tablet Take 1 tablet by mouth daily.    Marland Kitchen apixaban (ELIQUIS) 5 MG TABS tablet Take 1 tablet (5 mg total) by mouth 2 (two) times daily. 60 tablet 0  . benazepril (LOTENSIN) 40 MG tablet Take 1 tablet (40 mg total) by mouth daily. 90 tablet 3  . Cyanocobalamin (RA VITAMIN B-12 TR) 1000 MCG TBCR Take 2 tablets by mouth daily.     Marland Kitchen glimepiride (AMARYL) 1 MG tablet Take 1 mg by mouth daily. Takes 2 mg tablet once daily.    . hydrochlorothiazide (HYDRODIURIL) 25 MG tablet Take 25 mg by mouth every morning.     . hydrocortisone 2.5  % cream Apply topically. As directed    . LANCETS ULTRA FINE MISC As directed    . OVER THE COUNTER MEDICATION Replenex tablets: Take 3 tablet by mouth every morning (joint health)    . rosuvastatin (CRESTOR) 20 MG tablet Take 1 tablet (20 mg total) by mouth daily. 30 tablet 0  . tamsulosin (FLOMAX) 0.4 MG CAPS capsule TAKE 1 CAPSULE BY MOUTH ONCE DAILY 90 capsule 3  . ZETIA 10 MG tablet Take 1 tablet (10 mg total) by mouth daily. 30 tablet 11  . vitamin B-12 (CYANOCOBALAMIN) 1000 MCG tablet Take 1,000 mcg by mouth daily.     No current facility-administered medications for this visit.     Review of Systems : See HPI for pertinent positives and negatives.  Physical Examination  Vitals:   01/11/16 1131 01/11/16 1136  BP: (!) 147/63 (!) 126/53  Pulse: (!) 56   Resp: 18   Temp: 97.3 F (36.3 C)   TempSrc: Oral   SpO2: 95%   Weight: 267 lb 11.2 oz (121.4 kg)   Height: 6' (1.829 m)    Body mass index is 36.31 kg/m.  General: WDWN obese male in NAD GAIT: normal Eyes: PERRLA Pulmonary: CTAB with distant breath sounds, respirations at rest are non labored, no rales, rhonchi, or wheezing.  Cardiac: Irregular rhythm with bradycardia, no detected murmur  VASCULAR EXAM Carotid Bruits Left Right   positive positive    Radial pulses: left is 1+ palpable, right is 2+. Left brachial pulse is 3+ palpable.      LE Pulses LEFT RIGHT   POPLITEAL not palpable  not palpable    Gastrointestinal: soft, nontender, BS WNL, no r/g, no palpable masses, large abdomen (central adiposity).  Musculoskeletal: No muscle atrophy/wasting. M/S 5/5 throughout, Extremities without ischemic changes.  Neurologic: A&O X 3; Appropriate Affect, Speech is normal, CN 2-12 intact except is hard of hearing and has mild right facial droop  only with smiling, Pain and light touch intact in extremities, Motor exam as listed above.    Assessment: Evan [name] is a 75 y.o. male who is status post left carotid endarterectomy on 05/28/12 and right CEA on 01/29/08. He has no hx of stroke or TIA until July of 2017 when he had a hemorrhagic stroke, he has loss of some bilateral peripheral vision from this. He has no steal sx's in either hand or arm.  ABI's in 2014 were normal.  Fortunately his DM remains in fairly good control according to his wife, and he stopped smoking in 1994. He takes Eliquis for atrial fib.  DATA  MRI OF BRAIN WITH CONTRAST, 08-24-15: Subacute infarction in the right PCA  territory with hemorrhagic transformation and mild edema. Findings are consistent with the clinical time frame of 2 weeks ago.  Today's carotid duplex suggests no restenosis in the bilateral ICA's (both CEA sites). Left mid CCA suggests <50% stenosis.  Right vertebral artery flow is bidirectional, right subclavian artery waveforms are biphasic. Left vertebral artery flow is antegrade, left subclavian artery waveforms are biphasic.   Essentially unchanged since previous study on 01/20/2015.   Plan: Follow-up in 1 year with Carotid Duplex scan.   I discussed in depth with the patient the nature of atherosclerosis, and emphasized the importance of maximal medical management including strict control of blood pressure, blood glucose, and lipid levels, obtaining regular exercise, and continued cessation of smoking.  The patient is aware that without maximal medical management the underlying atherosclerotic disease process will progress, limiting the benefit of any interventions. The patient was given information about stroke prevention and what symptoms should prompt the patient to seek immediate medical care. Thank you for allowing [place] to participate in this patient's care.  Clemon Chambers, RN, MSN, FNP-C [facility] Office: [phone]  Clinic Physician: Oneida Alar  01/11/16 11:40 AM

## 2016-01-12 NOTE — Addendum Note (Signed)
Addended by: Lianne Cure A on: 01/12/2016 02:12 PM   Modules accepted: Orders

## 2016-01-13 ENCOUNTER — Telehealth: Payer: Self-pay

## 2016-01-13 NOTE — Telephone Encounter (Signed)
Spoke to patient's wife Dr.Jordan reviewed 11/20/15 sleep study.Sleep study normal, no sleep apnea detected.

## 2016-01-18 NOTE — Progress Notes (Signed)
[name] Date of Birth: [birth year]   History of Present Illness: [name] is seen today for follow up CAD and Afib. He has a history of coronary disease and is status post coronary bypass surgery in 2005. He had a cardiac catheterization 2007 which demonstrated his grafts were patent. He had a nuclear stress test in January of 2013 which demonstrated no significant perfusion abnormalities. His ejection fraction was estimated 46% but visually it appeared normal. He underwent a left carotid endarterectomy in May 2014. He is intolerant to statins due to severe myalgias and weakness.  He has permanent Atrial fibrillation . Due to his slow HR metoprolol dose was stopped.  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 His anticoagulation was held and he was treated with ASA before later starting on Eliquis. (was on Xarelto before). His wife notes his BP has been running [id number] systolic. His BP still fluctuates but has been doing pretty good. When seen 10 days ago it was [id number] systolic. Reports sugar running about 134. glimiperide increased to 2 mg. He has returned to driving. Still has mild visual field cut.   It was recommended that he have a sleep study following his CVA to rule out sleep apnea. Overnight oximetry showed drop in sat to <89 for one hour. A formal sleep study was performed in October. It showed only borderline sleep apnea with minimal desaturation. Sleep efficiency was poor so sleep apnea may have been underestimated.   Current Outpatient Prescriptions on File Prior to Visit  Medication Sig Dispense Refill  . acetaminophen (TYLENOL) 500 MG tablet Take 500-1,000 mg by mouth every 6 (six) hours as needed for mild pain or moderate pain.    Marland Kitchen amLODipine (NORVASC) 2.5 MG tablet Take 1 tablet by mouth daily.    Marland Kitchen apixaban (ELIQUIS) 5 MG TABS tablet Take 1 tablet (5 mg total) by mouth 2 (two) times daily. 60 tablet 0  .  benazepril (LOTENSIN) 40 MG tablet Take 1 tablet (40 mg total) by mouth daily. 90 tablet 3  . Cyanocobalamin (RA VITAMIN B-12 TR) 1000 MCG TBCR Take 2 tablets by mouth daily.     Marland Kitchen glimepiride (AMARYL) 1 MG tablet Take 2 mg by mouth daily. Takes 2 mg tablet once daily.    . hydrochlorothiazide (HYDRODIURIL) 25 MG tablet Take 25 mg by mouth every morning.     . hydrocortisone 2.5 % cream Apply topically. As directed    . LANCETS ULTRA FINE MISC As directed    . OVER THE COUNTER MEDICATION Replenex tablets: Take 3 tablet by mouth every morning (joint health)    . rosuvastatin (CRESTOR) 20 MG tablet Take 1 tablet (20 mg total) by mouth daily. 30 tablet 0  . tamsulosin (FLOMAX) 0.4 MG CAPS capsule TAKE 1 CAPSULE BY MOUTH ONCE DAILY 90 capsule 3  . ZETIA 10 MG tablet Take 1 tablet (10 mg total) by mouth daily. 30 tablet 11   No current facility-administered medications on file prior to visit.     Allergies  Allergen Reactions  . Niacin And Related Rash    Red face and burns.  . Metformin And Related Diarrhea  . Statins     Myalgias, weakness    Past Medical History:  Diagnosis Date  . Bruises easily   . Carotid arterial disease ([place])   . Coronary artery disease   . Diabetes mellitus   . Dyspnea  with exertion  . Frequency of urination   . Heart murmur   . HOH (hard of hearing)   . Hyperlipidemia   . Hypertension   . Obesity   . Osteoarthritis    Left wrist  . Peripheral vision loss, bilateral   . Stroke ([place]) 07/2015  . Torn rotator cuff 2012    Right arm    Past Surgical History:  Procedure Laterality Date  . BACK SURGERY     X2  . CARDIAC CATHETERIZATION  11/29/2005   SEVERE THREE VESSEL OBSTRUCTIVE ATHERSCLEROTIC CAD. ALL GRAFTS WERE PATENT. EF 65%  . CAROTID ENDARTERECTOMY Left 05-28-12   cea  . CAROTID ENDARTERECTOMY Right 01-29-08   cea  . CORONARY ARTERY BYPASS GRAFT  03/2003   X3. LIMA GRAFT TO THE LAD, SAPHENOUS VEIN GRAFT TO THE PA, AND A LEFT RADIAL GRAFT TO  THEOBTUSE MARGINAL VESSEL  . ENDARTERECTOMY Left 05/28/2012   Procedure: ENDARTERECTOMY CAROTID;  Surgeon: Elam Dutch, MD;  Location: [facility];  Service: Vascular;  Laterality: Left;  . MOLE SURGERY    . PATCH ANGIOPLASTY Left 05/28/2012   Procedure: WITH dACRON PATCH ANGIOPLASTY;  Surgeon: Elam Dutch, MD;  Location: [facility];  Service: Vascular;  Laterality: Left;    History  Smoking Status  . Former Smoker  . Quit date: 01/24/1994  Smokeless Tobacco  . Former User    History  Alcohol Use No    Family History  Problem Relation Age of Onset  . Heart attack Father   . Heart attack Brother   . Parkinsonism Brother   . Cancer Brother   . Arrhythmia Brother     Review of Systems: As noted in HPI. All other systems were reviewed and are negative.  Physical Exam: BP (!) 158/60   Pulse (!) 54   Ht 6' (1.829 m)   Wt 265 lb (120.2 kg)   BMI 35.94 kg/m  He is an obese white male in no acute distress. He is normocephalic, atraumatic. Pupils equal round react to light accommodation. Extraocular movements are full. Oropharynx is clear. Neck is without JVD, adenopathy, thyromegaly, or bruits. Lungs reveal scattered wheezes. Cardiac exam reveals an IRR and grade 2/6 systolic ejection murmur the right upper sternal border radiating to the left sternal border. There is no diastolic murmur or S3. Abdomen is obese, soft, nontender. He has no masses or bruits. He has no edema. Femoral and pedal pulses are good.  LABORATORY DATA: Lab Results  Component Value Date   WBC 8.4 08/26/2015   HGB 13.5 08/26/2015   HCT 42.1 08/26/2015   PLT 192 08/26/2015   GLUCOSE 146 (H) 12/11/2015   CHOL 100 12/11/2015   TRIG 59 12/11/2015   HDL 44 12/11/2015   LDLCALC 44 12/11/2015   ALT 19 12/11/2015   AST 22 12/11/2015   NA 140 12/11/2015   K 4.5 12/11/2015   CL 104 12/11/2015   CREATININE 0.96 12/11/2015   BUN 22 12/11/2015   CO2 27 12/11/2015   INR 1.20 08/24/2015   HGBA1C 6.7 (H) 08/25/2015     Echo 08/25/15: Study Conclusions  - Left ventricle: The cavity size was normal. Wall thickness was   increased in a pattern of severe LVH. Systolic function was   normal. The estimated ejection fraction was in the range of 50%   to 55%. Wall motion was normal; there were no regional wall   motion abnormalities. The study is not technically sufficient to   allow evaluation of  LV diastolic function. - Aortic valve: Moderately calcified. Moderate stenosis. Trivial   regurgitation. Mean gradient (S): 26 mm Hg. Peak gradient (S): 50   mm Hg. Valve area (VTI): 0.93 cm^2. Valve area (Vmax): 1.01 cm^2.   Valve area (Vmean): 0.96 cm^2. - Mitral valve: Calcified annulus. Mildly thickened leaflets .   There was trivial regurgitation. - Left atrium: Severely dilated. - Systemic veins: The IVC measures >2.1 cm, but collapses >50%,   suggesting an elevated RA pressure of 8 mmHg.  Impressions:  - Compared to a prior study in 2015, there is now moderate to   severe aortic stenosis - mean gradient has increased from 21 to   26 mmHg. A-fib is present in this study.  Assessment / Plan: 1. Coronary disease status post CABG in 2005. He has stable class I anginal symptoms. His Myoview study 2013 showed no significant ischemia. We'll continue with his medical therapy including metoprolol, and ACE inhibitor. Now on statin therapy.   2. Hyperlipidemia, intolerant to statins in the past. Now on Zetia and Crestor. Excellent response. Has only minor arthralgias.  3. Diabetes mellitus type 2. On Amaryl.  Managed by primary care.   4. Hypertension - fair control. Follow trend.   5. History of Trifascicular block.  On no rate slowing meds. Now with AFib and slow ventricular response. Holter monitor in Feb 2017 showed AFib with controlled rate. Some bradycardia while sleeping. Longest pause 3.3 seconds. No clear indication for pacemaker.   6. Carotid arterial disease status post left endarterectomy.  Carotid dopplers one week ago showed patency. No change.  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 CTA of head and neck in August showed patent LCEA and occluded right vertebral.   7. Atrial fibrillation- now permanent with slow rate.  Continue Eliquis 5 mg bid. Avoid NSAIDs.  On no rate slowing medication due to bradycardia.   8.  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 Now on Eliquis.   9. No significant OSA seen on sleep study.   Follow up in 6 months.

## 2016-01-21 ENCOUNTER — Ambulatory Visit: Payer: Medicare Other | Admitting: Cardiology

## 2016-01-21 ENCOUNTER — Ambulatory Visit: Payer: Medicare Other | Admitting: Family

## 2016-01-21 ENCOUNTER — Encounter (HOSPITAL_COMMUNITY): Payer: Medicare Other

## 2016-01-22 ENCOUNTER — Ambulatory Visit (INDEPENDENT_AMBULATORY_CARE_PROVIDER_SITE_OTHER): Payer: Medicare Other | Admitting: Cardiology

## 2016-01-22 ENCOUNTER — Encounter: Payer: Self-pay | Admitting: Cardiology

## 2016-01-22 VITALS — BP 158/60 | HR 54 | Ht 72.0 in | Wt 265.0 lb

## 2016-01-22 DIAGNOSIS — I481 Persistent atrial fibrillation: Secondary | ICD-10-CM

## 2016-01-22 DIAGNOSIS — E785 Hyperlipidemia, unspecified: Secondary | ICD-10-CM | POA: Diagnosis not present

## 2016-01-22 DIAGNOSIS — I452 Bifascicular block: Secondary | ICD-10-CM

## 2016-01-22 DIAGNOSIS — I10 Essential (primary) hypertension: Secondary | ICD-10-CM

## 2016-01-22 DIAGNOSIS — I639 Cerebral infarction, unspecified: Secondary | ICD-10-CM | POA: Diagnosis not present

## 2016-01-22 DIAGNOSIS — I25709 Atherosclerosis of coronary artery bypass graft(s), unspecified, with unspecified angina pectoris: Secondary | ICD-10-CM | POA: Diagnosis not present

## 2016-01-22 DIAGNOSIS — I4819 Other persistent atrial fibrillation: Secondary | ICD-10-CM

## 2016-01-22 DIAGNOSIS — I209 Angina pectoris, unspecified: Secondary | ICD-10-CM | POA: Diagnosis not present

## 2016-01-22 NOTE — Patient Instructions (Signed)
Continue your current therapy  I will see you in 6 months.   

## 2016-02-02 ENCOUNTER — Ambulatory Visit: Payer: PRIVATE HEALTH INSURANCE | Admitting: Neurology

## 2016-02-08 ENCOUNTER — Telehealth: Payer: Self-pay | Admitting: Cardiology

## 2016-02-08 MED ORDER — EZETIMIBE 10 MG PO TABS: 10.0000 mg | ORAL_TABLET | Freq: Every day | ORAL | 11 refills | Status: DC

## 2016-02-08 NOTE — Telephone Encounter (Signed)
New message      Pt c/o medication issue:  1. Name of Medication: zetia 2. How are you currently taking this medication (dosage and times per day)? 10mg  3. Are you having a reaction (difficulty breathing--STAT)?  4. What is your medication issue?  presc is written for brand name.  Can pt go to the the generic?  Patient calling the office for samples of medication:   1.  What medication and dosage are you requesting samples for?  eliquis 5mg   2.  Are you currently out of this medication? Almost out

## 2016-02-08 NOTE — Telephone Encounter (Signed)
Spoke w caller, advised unable to provide Eliquis samples due to not being stocked - advised to check again if they wish at a later date. Zetia changed from DAW to permitting generic fill after discussed w pharmD. Caller aware. Sent to their preferred pharmacy.

## 2016-03-11 ENCOUNTER — Telehealth: Payer: Self-pay | Admitting: Cardiology

## 2016-03-11 NOTE — Telephone Encounter (Signed)
Spoke with pt wife, aware no samples available.

## 2016-03-11 NOTE — Telephone Encounter (Signed)
New Message: ° ° ° ° ° ° °Patient calling the office for samples of medication: ° ° °1.  What medication and dosage are you requesting samples for?apixaban (ELIQUIS) 5 MG TABS tablet ° °2.  Are you currently out of this medication? No ° ° °

## 2016-03-15 ENCOUNTER — Telehealth: Payer: Self-pay | Admitting: Cardiology

## 2016-03-15 NOTE — Telephone Encounter (Signed)
Medication samples have been provided to the patient.  Drug name: Eliquis 5mg   Qty: 21 LOT: [id number]  Exp.Date: 3/20  Samples left at front desk for patient pick-up. Patient notified.  Evan [name] 2:34 PM 03/15/2016

## 2016-03-15 NOTE — Telephone Encounter (Signed)
Patient calling the office for samples of medication:   1.  What medication and dosage are you requesting samples for? eliquis 5mg   2.  Are you currently out of this medication? 2days left

## 2016-03-23 ENCOUNTER — Telehealth: Payer: Self-pay | Admitting: Cardiology

## 2016-03-23 NOTE — Telephone Encounter (Signed)
New Message  Pt wife call requesting to speak with RN. Pt wife states pt hit his elbow and now it has swollen to the size of a lemon. Pt wife would like to know if the eliquis has anything to do with the swelling being so big. Please call back to discuss

## 2016-03-23 NOTE — Telephone Encounter (Signed)
Spoke w wife of patient, notes patient has an observable egg or lemon sized "knot" on elbow which popped up yesterday. No obvious bruising, states knot is painful, she wonders if he got it while chopping wood or hit it yesterday.  Advised to see PCP or ortho, to see if this is explainable as bursitis or other joint concern. Advised Eliquis probably not a factor in this case, but monitor for unusual bruising or increased swelling. Wife voiced understanding and thanks.

## 2016-04-06 ENCOUNTER — Telehealth: Payer: Self-pay | Admitting: Cardiology

## 2016-04-06 NOTE — Telephone Encounter (Signed)
New message   Patient calling the office for samples of medication:   1.  What medication and dosage are you requesting samples for?apixaban (ELIQUIS) 5 MG TABS tablet  2.  Are you currently out of this medication? Will be out tomorrow

## 2016-04-07 NOTE — Telephone Encounter (Signed)
Patient aware samples are at the front desk for pick up  

## 2016-05-11 ENCOUNTER — Ambulatory Visit: Payer: Medicare Other | Admitting: Neurology

## 2016-07-24 NOTE — Progress Notes (Signed)
Evan [name] Date of Birth: [birth year]   History of Present Illness: Evan [name] is seen today for follow up CAD and Afib. He has a history of coronary disease and is status post coronary bypass surgery in 2005. He had a cardiac catheterization 2007 which demonstrated his grafts were patent. He had a nuclear stress test in January of 2013 which demonstrated no significant perfusion abnormalities. His ejection fraction was estimated 46% but visually it appeared normal. He underwent a left carotid endarterectomy in May 2014. He is intolerant to statins due to severe myalgias and weakness.  He has permanent Atrial fibrillation . Due to his slow HR metoprolol dose was stopped.   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 His anticoagulation was held and he was treated with ASA before later starting on Eliquis. (was on Xarelto before).  It was recommended that he have a sleep study following his CVA to rule out sleep apnea. Overnight oximetry showed drop in sat to <89 for one hour. A formal sleep study was performed in October. It showed only borderline sleep apnea with minimal desaturation. Sleep efficiency was poor so sleep apnea may have been underestimated.   He was seen recently by GI for evaluation of heme positive stool. Colonoscopy recommended but patient initially refused. Hgb 11.7- down from 13.3. No obvious bleeding.   He denies any chest pain, dyspnea, edema, dizziness, or palpitations. Energy level OK.  Current Outpatient Prescriptions on File Prior to Visit  Medication Sig Dispense Refill  . acetaminophen (TYLENOL) 500 MG tablet Take 500-1,000 mg by mouth every 6 (six) hours as needed for mild pain or moderate pain.    Marland Kitchen amLODipine (NORVASC) 2.5 MG tablet Take 1 tablet by mouth daily.    Marland Kitchen apixaban (ELIQUIS) 5 MG TABS tablet Take 1 tablet (5 mg total) by mouth 2 (two) times daily. 60 tablet 0  . benazepril  (LOTENSIN) 40 MG tablet Take 1 tablet (40 mg total) by mouth daily. 90 tablet 3  . Cyanocobalamin (RA VITAMIN B-12 TR) 1000 MCG TBCR Take 1 tablet by mouth daily.     Marland Kitchen ezetimibe (ZETIA) 10 MG tablet Take 1 tablet (10 mg total) by mouth daily. 30 tablet 11  . hydrochlorothiazide (HYDRODIURIL) 25 MG tablet Take 25 mg by mouth every morning.     Marland Kitchen OVER THE COUNTER MEDICATION Replenex tablets: Take 3 tablet by mouth every morning (joint health)    . rosuvastatin (CRESTOR) 20 MG tablet Take 1 tablet (20 mg total) by mouth daily. 30 tablet 0  . tamsulosin (FLOMAX) 0.4 MG CAPS capsule TAKE 1 CAPSULE BY MOUTH ONCE DAILY 90 capsule 3   No current facility-administered medications on file prior to visit.     Allergies  Allergen Reactions  . Niacin And Related Rash    Red face and burns.  . Metformin And Related Diarrhea  . Statins     Myalgias, weakness    Past Medical History:  Diagnosis Date  . Bruises easily   . Carotid arterial disease ([place])   . Coronary artery disease   . Diabetes mellitus   . Dyspnea    with exertion  . Frequency of urination   . Heart murmur   . HOH (hard of hearing)   . Hyperlipidemia   . Hypertension   . Obesity   . Osteoarthritis    Left wrist  . Peripheral vision loss, bilateral   .  Stroke ([place]) 07/2015  . Torn rotator cuff 2012    Right arm    Past Surgical History:  Procedure Laterality Date  . BACK SURGERY     X2  . CARDIAC CATHETERIZATION  11/29/2005   SEVERE THREE VESSEL OBSTRUCTIVE ATHERSCLEROTIC CAD. ALL GRAFTS WERE PATENT. EF 65%  . CAROTID ENDARTERECTOMY Left 05-28-12   cea  . CAROTID ENDARTERECTOMY Right 01-29-08   cea  . CORONARY ARTERY BYPASS GRAFT  03/2003   X3. LIMA GRAFT TO THE LAD, SAPHENOUS VEIN GRAFT TO THE PA, AND A LEFT RADIAL GRAFT TO THEOBTUSE MARGINAL VESSEL  . ENDARTERECTOMY Left 05/28/2012   Procedure: ENDARTERECTOMY CAROTID;  Surgeon: Elam Dutch, MD;  Location: [facility];  Service: Vascular;  Laterality: Left;  . MOLE  SURGERY    . PATCH ANGIOPLASTY Left 05/28/2012   Procedure: WITH dACRON PATCH ANGIOPLASTY;  Surgeon: Elam Dutch, MD;  Location: [facility];  Service: Vascular;  Laterality: Left;    History  Smoking Status  . Former Smoker  . Quit date: 01/24/1994  Smokeless Tobacco  . Former User    History  Alcohol Use No    Family History  Problem Relation Age of Onset  . Heart attack Father   . Heart attack Brother   . Parkinsonism Brother   . Cancer Brother   . Arrhythmia Brother     Review of Systems: As noted in HPI. All other systems were reviewed and are negative.  Physical Exam: BP (!) 168/66   Pulse (!) 48   Ht 6' (1.829 m)   Wt 271 lb (122.9 kg)   BMI 36.75 kg/m  He is an obese white male in no acute distress. He is normocephalic, atraumatic. Pupils equal round react to light accommodation. Extraocular movements are full. Oropharynx is clear. Neck is without JVD, adenopathy, thyromegaly, or bruits. Lungs reveal scattered wheezes. Cardiac exam reveals an IRR and grade 2/6 systolic ejection murmur the right upper sternal border radiating to the left sternal border. There is no diastolic murmur or S3. Abdomen is obese, soft, nontender. He has no masses or bruits. He has no edema. Femoral and pedal pulses are good. Some bruising noted on forearms.  LABORATORY DATA: Lab Results  Component Value Date   WBC 8.4 08/26/2015   HGB 13.5 08/26/2015   HCT 42.1 08/26/2015   PLT 192 08/26/2015   GLUCOSE 146 (H) 12/11/2015   CHOL 100 12/11/2015   TRIG 59 12/11/2015   HDL 44 12/11/2015   LDLCALC 44 12/11/2015   ALT 19 12/11/2015   AST 22 12/11/2015   NA 140 12/11/2015   K 4.5 12/11/2015   CL 104 12/11/2015   CREATININE 0.96 12/11/2015   BUN 22 12/11/2015   CO2 27 12/11/2015   INR 1.20 08/24/2015   HGBA1C 6.7 (H) 08/25/2015   Ecg today shows Afib rate 48. LAFB, RBBB. I have personally reviewed and interpreted this study.   Echo 08/25/15: Study Conclusions  - Left ventricle: The  cavity size was normal. Wall thickness was   increased in a pattern of severe LVH. Systolic function was   normal. The estimated ejection fraction was in the range of 50%   to 55%. Wall motion was normal; there were no regional wall   motion abnormalities. The study is not technically sufficient to   allow evaluation of LV diastolic function. - Aortic valve: Moderately calcified. Moderate stenosis. Trivial   regurgitation. Mean gradient (S): 26 mm Hg. Peak gradient (S): 50   mm  Hg. Valve area (VTI): 0.93 cm^2. Valve area (Vmax): 1.01 cm^2.   Valve area (Vmean): 0.96 cm^2. - Mitral valve: Calcified annulus. Mildly thickened leaflets .   There was trivial regurgitation. - Left atrium: Severely dilated. - Systemic veins: The IVC measures >2.1 cm, but collapses >50%,   suggesting an elevated RA pressure of 8 mmHg.  Impressions:  - Compared to a prior study in 2015, there is now moderate to   severe aortic stenosis - mean gradient has increased from 21 to   26 mmHg. A-fib is present in this study.  Assessment / Plan: 1. Coronary disease status post CABG in 2005. He has stable class I anginal symptoms. His Myoview study 2013 showed no significant ischemia. We'll continue with his medical therapy including metoprolol, and ACE inhibitor,  statin therapy.   2. Hyperlipidemia, intolerant to statins in the past. Now on Zetia and Crestor. Excellent response. Has only minor arthralgias.  3. Diabetes mellitus type 2.  Managed by primary care.   4. Hypertension - wife reports BP at home has been good. Follow trend.   5. 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  On no rate slowing meds. Now with AFib and slow ventricular response. Holter monitor in Feb 2017 showed AFib with controlled rate. Some bradycardia while sleeping. Longest pause 3.3 seconds. No clear indication for pacemaker.   6. Carotid arterial disease status post left endarterectomy.  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 CTA of head and neck in August 2017 showed patent LCEA and occluded right vertebral.   7. Atrial fibrillation- now permanent with slow rate.  Continue Eliquis 5 mg bid. Avoid NSAIDs.  On no rate slowing medication due to bradycardia.   8.  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 Now on Eliquis.   9. No significant OSA seen on sleep study.   10. Heme positive stool with mild anemia. Discussed recommendations for colonoscopy. I think this is important since he is on chronic anticoagulation so bleeding risk is higher. I have encouraged him to proceed with colonoscopy and he is more agreeable to proceed. He will contact GI. Will need to hold Eliquis 48 hours prior to procedure- no bridging.   Follow up in 6 months.

## 2016-07-26 ENCOUNTER — Encounter: Payer: Self-pay | Admitting: Cardiology

## 2016-07-26 ENCOUNTER — Ambulatory Visit (INDEPENDENT_AMBULATORY_CARE_PROVIDER_SITE_OTHER): Payer: Medicare Other | Admitting: Cardiology

## 2016-07-26 VITALS — BP 168/66 | HR 48 | Ht 72.0 in | Wt 271.0 lb

## 2016-07-26 DIAGNOSIS — I6523 Occlusion and stenosis of bilateral carotid arteries: Secondary | ICD-10-CM | POA: Diagnosis not present

## 2016-07-26 DIAGNOSIS — I209 Angina pectoris, unspecified: Secondary | ICD-10-CM | POA: Diagnosis not present

## 2016-07-26 DIAGNOSIS — I481 Persistent atrial fibrillation: Secondary | ICD-10-CM

## 2016-07-26 DIAGNOSIS — I25709 Atherosclerosis of coronary artery bypass graft(s), unspecified, with unspecified angina pectoris: Secondary | ICD-10-CM

## 2016-07-26 DIAGNOSIS — I452 Bifascicular block: Secondary | ICD-10-CM

## 2016-07-26 DIAGNOSIS — I4819 Other persistent atrial fibrillation: Secondary | ICD-10-CM

## 2016-07-26 DIAGNOSIS — R195 Other fecal abnormalities: Secondary | ICD-10-CM | POA: Diagnosis not present

## 2016-07-26 NOTE — Patient Instructions (Addendum)
You are OK to have a colonoscopy from my standpoint. You would need to hold Eliquis for 48 hours prior  Continue your other therapy  I will see you in 6 months

## 2016-08-02 ENCOUNTER — Ambulatory Visit (INDEPENDENT_AMBULATORY_CARE_PROVIDER_SITE_OTHER): Payer: Medicare Other | Admitting: Neurology

## 2016-08-02 ENCOUNTER — Encounter: Payer: Self-pay | Admitting: Neurology

## 2016-08-02 VITALS — BP 145/67 | HR 64 | Wt 269.4 lb

## 2016-08-02 DIAGNOSIS — E1159 Type 2 diabetes mellitus with other circulatory complications: Secondary | ICD-10-CM

## 2016-08-02 DIAGNOSIS — I6523 Occlusion and stenosis of bilateral carotid arteries: Secondary | ICD-10-CM | POA: Diagnosis not present

## 2016-08-02 DIAGNOSIS — I480 Paroxysmal atrial fibrillation: Secondary | ICD-10-CM | POA: Diagnosis not present

## 2016-08-02 DIAGNOSIS — I10 Essential (primary) hypertension: Secondary | ICD-10-CM

## 2016-08-02 DIAGNOSIS — E785 Hyperlipidemia, unspecified: Secondary | ICD-10-CM | POA: Diagnosis not present

## 2016-08-02 DIAGNOSIS — I63531 Cerebral infarction due to unspecified occlusion or stenosis of right posterior cerebral artery: Secondary | ICD-10-CM

## 2016-08-02 NOTE — Patient Instructions (Addendum)
-   continue eliquis and crestor for stroke prevention - follow up with Dr. [name] as scheduled. - Follow up with your primary care physician for stroke risk factor modification. Recommend maintain blood pressure goal <130/80, diabetes with hemoglobin A1c goal below 7.0% and lipids with LDL cholesterol goal below 70 mg/dL.  - check BP and glucose at home - drive carefully.  - healthy diet and regular exercise - follow up as needed.

## 2016-08-02 NOTE — Progress Notes (Signed)
STROKE NEUROLOGY FOLLOW UP NOTE  NAME: Evan [name] DOB: 1940/08/19  REASON FOR VISIT: stroke follow up HISTORY FROM: Patient and wife and chart  Today we had the pleasure of seeing Evan [name] in follow-up at our Neurology Clinic. Pt was accompanied by wife and daughter.   History Summary Evan [name] is a 76 y.o. male with history of CAD, s/p of CABG, carotid artery stenosis, s/p of left CEA in 2014, poor hearing, A fibon Xarelto, hypertension, hyperlipidemia, diabetes mellitus was admitted on 08/24/2015 for blurry vision. 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 MRI showed subacute right PCA infarct with hemorrhagic transformation. CTA head and neck showed diffuse atherosclerosis without significant ICA stenosis. EF 50-55%. LDL 95 and A1c 6.7. He was started on aspirin initially due to hemorrhagic transformation, and then switched to Eliquis for stroke prevention.  Dr. [name], his cardiologist, also agreed with the plan. He has intolerance to Lipitor and Zocor in the past, he was continued on Zetia on discharge.   10/13/16 follow up - the patient has been do well. 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 Complaining of blurry vision which can be corrected by glasses. He is yet to see a ophthalmologist. Blood pressure at home 140-160, today in clinic 142/63. He is going to see Dr. [name] and PCP next week. He has symptoms and signs of OSA, however declined sleep study at this time. Started on Crestor 20 mg by PCP, so far tolerating well. Continued on eliquis without side effect.   Interval History During the interval time, pt has been doing well. Still on eliquis and crestor. BP stable at home 140/60. Glucose at home 150s and recent A1C 6.6. Today in clinic BP 105/67. No recurrent stroke like symptoms. Recently found to have anemia, and PCP has referred him to GI for colonoscopy. 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   REVIEW OF SYSTEMS: Full 14 system review of systems performed and notable only for those listed below and in HPI above, all others are negative:  Constitutional:   Cardiovascular: leg swelling Ear/Nose/Throat:  Hearing loss Skin: Moles Eyes:  Eye redness, light sensitivity, loss of vision Respiratory:  SOB Gastroitestinal:  Rectal bleeding? Genitourinary: Frequency of urination Hematology/Lymphatic:  Bruise/bleed easily, anemia Endocrine: heat intolerance Musculoskeletal:  Joint swelling, aching muscles, walking difficulty Allergy/Immunology:   Neurological:  Memory loss, weakness Psychiatric: confusion Sleep: frequent waking, daytime sleepiness  The following represents the patient's updated allergies and side effects list: Allergies  Allergen Reactions  . Niacin And Related Rash    Red face and burns.  . Metformin And Related Diarrhea  . Statins     Myalgias, weakness    The neurologically relevant items on the patient's problem list were reviewed on today's visit.  Neurologic Examination  A problem focused neurological exam (12 or more points of the single system neurologic examination, vital signs counts as 1 point, cranial nerves count for 8 points) was performed.  Blood pressure (!) 145/67, pulse 64, weight 269 lb 6.4 oz (122.2 kg).  General - Well nourished, well developed, in no apparent distress.  Ophthalmologic - Fundi not visualized due to eye movement.  Cardiovascular - Regular rate and rhythm with no murmur, not in A. fib.  Mental Status -  Level of arousal and orientation to time, place, and person were intact. Language including expression, naming, repetition, comprehension was assessed and found intact. Fund  of Knowledge was assessed and was intact.  Cranial Nerves II - XII - II -  left upper quadrantanopia. III, IV, VI - Extraocular movements intact. V - Facial sensation intact bilaterally. VII - Facial movement intact  bilaterally. VIII - Hearing & vestibular intact bilaterally. X - Palate elevates symmetrically. XI - Chin turning & shoulder shrug intact bilaterally. XII - Tongue protrusion intact.  Motor Strength - The patient's strength was normal in all extremities and pronator drift was absent.  Bulk was normal and fasciculations were absent.   Motor Tone - Muscle tone was assessed at the neck and appendages and was normal.  Reflexes - The patient's reflexes were 1+ in all extremities and he had no pathological reflexes.  Sensory - Light touch, temperature/pinprick were assessed and were normal.    Coordination - The patient had normal movements in the hands with no ataxia or dysmetria.  Tremor was absent.  Gait and Station - The patient's transfers, posture, gait, station, and turns were observed as normal.   Data reviewed: I personally reviewed the images and agree with the radiology interpretations.  Ct Head Wo Contrast 08/24/2015 Hemorrhage with surrounding edema versus hemorrhagic mass involving the right occipital lobe. This causes mass effect on the right lateral ventricle but no midline shift.   Ct Angio Head & Neck W Or Wo Contrast 08/25/2015 Aortic atherosclerosis. Atherosclerosis diffusely. Atherosclerosis at the right carotid bifurcation but no stenosis. Atherosclerosis of the left common carotid artery with stenosis 1.5 cm proximal to the bifurcation of 40%. Previous carotid endarterectomy on the left is widely patent. Atherosclerosis of both carotid siphon regions with stenosis estimated at 30-40% in that region bilaterally. No significant intracranial anterior circulation finding. 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 Dominant left vertebral artery is patent, but with 50% origin stenosis. Major posterior circulation branch vessels are patent, including the right PCA, but there are not as  many branches visible on the right as the left, consistent with PCA branch vessel embolic infarction.   Mr Jeri Cos Or Wo Contrast 08/24/2015 Subacute infarction in the right PCA territory with hemorrhagic transformation and mild edema. Findings are consistent with the clinical time frame of 2 weeks ago.   TTE  - Left ventricle: The cavity size was normal. Wall thickness wasincreased in a pattern of severe LVH. Systolic function wasnormal. The estimated ejection fraction was in the range of 50%to 55%. Wall motion was normal; there were no regional wallmotion abnormalities. The study is not technically sufficient toallow evaluation of LV diastolic function. - Aortic valve: Moderately calcified. Moderate stenosis. Trivialregurgitation. Mean gradient (S): 26 mm Hg. Peak gradient (S): 19mm Hg. Valve area (VTI): 0.93 cm^2. Valve area (Vmax): 1.01 cm^2.Valve area (Vmean): 0.96 cm^2. - Mitral valve: Calcified annulus. Mildly thickened leaflets . There was trivial regurgitation. - Left atrium: Severely dilated. - Systemic veins: The IVC measures >2.1 cm, but collapses >50%,suggesting an elevated RA pressure of 8 mmHg. Impressions:   Compared to a prior study in 2015, there is now moderate tosevere aortic stenosis - mean gradient has increased from 21 to26 mmHg. A-fib is present in this study.  Component     Latest Ref Rng & Units 08/25/2015  Cholesterol     0 - 200 mg/dL 149  Triglycerides     <150 mg/dL 73  HDL Cholesterol     >40 mg/dL 39 (L)  Total CHOL/HDL Ratio  RATIO 3.8  VLDL     0 - 40 mg/dL 15  LDL (calc)     0 - 99 mg/dL 95  Hemoglobin A1C     4.8 - 5.6 % 6.7 (H)  Mean Plasma Glucose     mg/dL 146    Assessment: As you may recall, he is a 76 y.o. Caucasian male with PMH of CAD, s/p of CABG, carotid artery stenosis, s/p of left CEA in 2014, poor hearing, A fibon Xarelto, hypertension, hyperlipidemia, diabetes mellitus was admitted on 08/24/2015 for blurry vision. He  was found to have left upper quadrantanopia. MRI showed subacute right PCA infarct with hemorrhagic transformation. CTA head and neck showed diffuse atherosclerosis without significant ICA stenosis. EF 50-55%. LDL 95 and A1c 6.7. He was started on aspirin initially due to hemorrhagic transformation, and then switched to Eliquis for stroke prevention. He has symptoms and signs of OSA, however declined sleep study at this time. Started on Crestor 20 mg by PCP, so far tolerating well. 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 Recently found to have anemia, and PCP has referred him to GI for colonoscopy.   Plan:  - continue eliquis and crestor for stroke prevention - follow up with Dr. [name] for PAF. - follow up with GI for anemia evaluation - Follow up with your primary care physician for stroke risk factor modification. Recommend maintain blood pressure goal <130/80, diabetes with hemoglobin A1c goal below 7.0% and lipids with LDL cholesterol goal below 70 mg/dL.  - check BP and glucose at home - drive carefully.  - healthy diet and regular exercise - follow up as needed.   I spent more than 25 minutes of face to face time with the patient. Greater than 50% of time was spent in counseling and coordination of care. We discussed driving precautions, follow up with GI and cardiology.   No orders of the defined types were placed in this encounter.   No orders of the defined types were placed in this encounter.   Patient Instructions  - continue eliquis and crestor for stroke prevention - follow up with Dr. [name] as scheduled. - Follow up with your primary care physician for stroke risk factor modification. Recommend maintain blood pressure goal <130/80, diabetes with hemoglobin A1c goal below 7.0% and lipids with LDL cholesterol goal below 70 mg/dL.  - check BP and glucose at home - drive carefully.  - healthy  diet and regular exercise - follow up as needed.    Rosalin Hawking, MD PhD [facility] [address], [place], [place] [address] [phone]

## 2016-10-14 ENCOUNTER — Telehealth: Payer: Self-pay | Admitting: Cardiology

## 2016-10-14 NOTE — Telephone Encounter (Signed)
Returned call to wife she stated husband has been sob for the past week.Stated no chest pain.Swelling in lower legs,but no worse.Appointment scheduled with Almyra Deforest PA 10/19/16 at 11:00 am.Stated he does not want appointment,but she will call me back to cancel if he want come in.

## 2016-10-14 NOTE — Telephone Encounter (Signed)
Wife would like for you to call her please.

## 2016-10-14 NOTE — Telephone Encounter (Signed)
Follow up     Pt wife is calling back for [facility]. She said that pt is not coming in.

## 2016-10-19 ENCOUNTER — Ambulatory Visit: Payer: Medicare Other | Admitting: Physician Assistant

## 2016-11-17 ENCOUNTER — Telehealth: Payer: Self-pay | Admitting: Cardiology

## 2016-11-17 NOTE — Telephone Encounter (Signed)
Patient calling the office for samples of medication:   1.  What medication and dosage are you requesting samples for? Eliquis  2.  Are you currently out of this medication?  In the donut hole   

## 2016-11-17 NOTE — Telephone Encounter (Signed)
Returned call to patient's wife.Eliquis 5 mg samples left a [facility] office front desk.

## 2016-12-12 ENCOUNTER — Encounter: Admission: RE | Payer: Self-pay | Source: Ambulatory Visit

## 2016-12-12 ENCOUNTER — Ambulatory Visit: Admission: RE | Admit: 2016-12-12 | Payer: Medicare Other | Source: Ambulatory Visit | Admitting: Gastroenterology

## 2016-12-12 SURGERY — COLONOSCOPY WITH PROPOFOL
Anesthesia: General

## 2016-12-21 ENCOUNTER — Telehealth: Payer: Self-pay | Admitting: Cardiology

## 2016-12-21 ENCOUNTER — Other Ambulatory Visit: Payer: Self-pay | Admitting: *Deleted

## 2016-12-21 NOTE — Telephone Encounter (Signed)
°  New Prob  Patient calling the office for samples of medication:   1.  What medication and dosage are you requesting samples for? Eliquis 5 mg  2.  Are you currently out of this medication? He will be out in a few days

## 2016-12-22 ENCOUNTER — Ambulatory Visit: Payer: Medicare Other | Admitting: Cardiology

## 2017-01-10 ENCOUNTER — Encounter: Payer: Self-pay | Admitting: Family

## 2017-01-10 ENCOUNTER — Ambulatory Visit (HOSPITAL_COMMUNITY)
Admission: RE | Admit: 2017-01-10 | Discharge: 2017-01-10 | Disposition: A | Discharge status: Home / Self Care | Payer: Medicare Other | Source: Ambulatory Visit | Attending: Family | Admitting: Family

## 2017-01-10 ENCOUNTER — Ambulatory Visit (INDEPENDENT_AMBULATORY_CARE_PROVIDER_SITE_OTHER): Payer: Medicare Other | Admitting: Family

## 2017-01-10 VITALS — BP 141/67 | HR 45 | Temp 98.1°F | Resp 17 | Wt 270.4 lb

## 2017-01-10 DIAGNOSIS — Z9889 Other specified postprocedural states: Secondary | ICD-10-CM | POA: Diagnosis present

## 2017-01-10 DIAGNOSIS — I6523 Occlusion and stenosis of bilateral carotid arteries: Secondary | ICD-10-CM | POA: Diagnosis present

## 2017-01-10 LAB — VAS US CAROTID
LEFT VERTEBRAL DIAS: 23 cm/s
LICADDIAS: -25 cm/s
LICAPDIAS: -22 cm/s
LICAPSYS: -90 cm/s
Left CCA dist dias: -19 cm/s
Left CCA dist sys: -97 cm/s
Left CCA prox dias: 18 cm/s
Left CCA prox sys: 107 cm/s
Left ICA dist sys: -100 cm/s
RIGHT CCA MID DIAS: 13 cm/s
RIGHT ECA DIAS: -10 cm/s
RIGHT VERTEBRAL DIAS: -9 cm/s
Right CCA prox dias: 18 cm/s
Right CCA prox sys: 177 cm/s
Right cca dist sys: -130 cm/s

## 2017-01-10 NOTE — Patient Instructions (Signed)

## 2017-01-10 NOTE — Progress Notes (Signed)
Chief Complaint: Follow up Extracranial Carotid Artery Stenosis   History of Present Illness  Evan [name] is a 76 y.o. male who is status post left carotid endarterectomy on 05/28/12 and right CEA on 01/29/08 by Dr. Oneida Alar. He returns today for follow up and carotid artery Duplex.  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 His Xarleto for atrial fib was changed to Eliquis at that time according to pt's wife. He reports " I sometimes slobber out of the right side of my mouth", and has loss of bilateral peripheral vision since this stroke. He has no hemiparesis, no speech difficulties. He remains hard of hearing.   He denies steal symptoms in either arm. He denies claudication symptoms with walking, denies non-healing wounds.  Wife states that his walking is limited by his dyspnea.   ABI's requested by Dr. [name] and performed August 2014 reviewed, were normal. Wife states pt may need a pacemaker.   Wife states he needs a colonoscopy as part of his evaluation of his anemia, but pt is putting this off.    Pt Diabetic: Yes, A1C  was 6.7 on 08-25-15, in control (review of records) Pt smoker: former smoker, quit in 1994  Pt meds include: Statin: Crestor started August 2017, had myalgias with previous statins  Betablocker: no, stopped due to bradycardia ASA: no Other anticoagulants/antiplatelets: Xarelto changed to Eliquis after the hemorrhagic stroke on 08-24-15   Past Medical History:  Diagnosis Date  . Bruises easily   . Carotid arterial disease ([place])   . Coronary artery disease   . Diabetes mellitus   . Dyspnea    with exertion  . Frequency of urination   . Heart murmur   . HOH (hard of hearing)   . Hyperlipidemia   . Hypertension   . Obesity   . Osteoarthritis    Left wrist  . Peripheral vision loss, bilateral   . Stroke ([place]) 07/2015  . Torn rotator cuff 2012    Right arm    Social History Social  History   Tobacco Use  . Smoking status: Former Smoker    Last attempt to quit: 01/24/1994    Years since quitting: 22.9  . Smokeless tobacco: Former Network engineer Use Topics  . Alcohol use: No  . Drug use: No    Family History Family History  Problem Relation Age of Onset  . Heart attack Father   . Heart attack Brother   . Parkinsonism Brother   . Cancer Brother   . Arrhythmia Brother     Surgical History Past Surgical History:  Procedure Laterality Date  . BACK SURGERY     X2  . CARDIAC CATHETERIZATION  11/29/2005   SEVERE THREE VESSEL OBSTRUCTIVE ATHERSCLEROTIC CAD. ALL GRAFTS WERE PATENT. EF 65%  . CAROTID ENDARTERECTOMY Left 05-28-12   cea  . CAROTID ENDARTERECTOMY Right 01-29-08   cea  . CORONARY ARTERY BYPASS GRAFT  03/2003   X3. LIMA GRAFT TO THE LAD, SAPHENOUS VEIN GRAFT TO THE PA, AND A LEFT RADIAL GRAFT TO THEOBTUSE MARGINAL VESSEL  . ENDARTERECTOMY Left 05/28/2012   Procedure: ENDARTERECTOMY CAROTID;  Surgeon: Elam Dutch, MD;  Location: [facility];  Service: Vascular;  Laterality: Left;  . MOLE SURGERY    . PATCH ANGIOPLASTY Left 05/28/2012   Procedure: WITH dACRON PATCH ANGIOPLASTY;  Surgeon: Elam Dutch, MD;  Location: [facility];  Service: Vascular;  Laterality: Left;  Allergies  Allergen Reactions  . Niacin And Related Rash    Red face and burns.  . Metformin And Related Diarrhea  . Statins     Myalgias, weakness    Current Outpatient Medications  Medication Sig Dispense Refill  . acetaminophen (TYLENOL) 500 MG tablet Take 650 mg by mouth every 6 (six) hours as needed for mild pain or moderate pain.     Marland Kitchen apixaban (ELIQUIS) 5 MG TABS tablet Take 1 tablet (5 mg total) by mouth 2 (two) times daily. 60 tablet 0  . benazepril (LOTENSIN) 40 MG tablet Take 1 tablet (40 mg total) by mouth daily. 90 tablet 3  . Cyanocobalamin (RA VITAMIN B-12 TR) 1000 MCG TBCR Take 1 tablet by mouth daily.     Marland Kitchen ezetimibe (ZETIA) 10 MG tablet Take 1 tablet (10 mg total) by  mouth daily. 30 tablet 11  . glimepiride (AMARYL) 2 MG tablet Take 2 mg by mouth daily with breakfast.    . hydrochlorothiazide (HYDRODIURIL) 25 MG tablet Take 25 mg by mouth every morning.     Marland Kitchen OVER THE COUNTER MEDICATION Replenex tablets: Take 3 tablet by mouth every morning (joint health)    . rosuvastatin (CRESTOR) 20 MG tablet Take 1 tablet (20 mg total) by mouth daily. 30 tablet 0  . tamsulosin (FLOMAX) 0.4 MG CAPS capsule TAKE 1 CAPSULE BY MOUTH ONCE DAILY 90 capsule 3   No current facility-administered medications for this visit.     Review of Systems : See HPI for pertinent positives and negatives.  Physical Examination  Vitals:   01/10/17 1110 01/10/17 1115  BP: (!) 154/74 (!) 141/67  Pulse: (!) 45   Resp: 17   Temp: 98.1 F (36.7 C)   TempSrc: Oral   SpO2: 97%   Weight: 270 lb 6.4 oz (122.7 kg)    Body mass index is 36.67 kg/m.  General: WDWN obese male in NAD GAIT:normal Eyes: PERRLA Pulmonary: CTAB with distant breath sounds, respirations at rest are non labored, no rales, rhonchi, or wheezing.  Cardiac: Irregular rhythm with bradycardia, + murmur  VASCULAR EXAM Carotid Bruits Left Right   transmitted cardiac murmur transmitted cardiac murmur    Radial pulses: left is 1+ palpable, right is 2+. Left brachial pulse is 3+ palpable.      LE Pulses LEFT RIGHT   POPLITEAL not palpable  not palpable   Bilateral PT pedal pulses are 2+palpable.   Gastrointestinal: soft, nontender, BS WNL, no r/g, no palpable masses, large abdomen (central adiposity).  Musculoskeletal: No muscle atrophy/wasting. M/S 5/5 throughout, Extremities without ischemic changes.  Skin: No rash, no cellulitis, no ulcers noted.   Neurologic: A&O X 3; appropriate affect,speech is loud, CN 2-12 intact except is hard of  hearing, Pain and light touch intact in extremities, Motor exam as listed above     Assessment: Evan [name] is a 76 y.o. male who status post left carotid endarterectomy on 05/28/12 and right CEA on 01/29/08.  He has no hx of stroke or TIA until July of 2017 when he had a hemorrhagic stroke, he has loss of some bilateral peripheral vision from this. He has no steal sx's in either hand or arm.  ABI's in 2014 were normal.  Fortunately his DM remains in fairly good control according to his wife, and he stopped smoking in 1994. He takes Eliquis for atrial fib.   DATA Carotid Duplex (01/10/17): Bilateral CEA sites with no restenosis.  Proximal right CCA velocities are elevated (177  cm/s) with stenotic subclavian velocities.  Left distal CCA velocities suggest <50% stenosis.  No significant change compared to the exam on 01-11-16.   MRI OF BRAIN WITH CONTRAST, 08-24-15: Subacute infarction in the right PCA territory with hemorrhagic transformation and mild edema. Findings are consistent with the clinical time frame of 2 weeks ago.    Plan: Follow-up in 18 months with Carotid Duplex scan.   I discussed in depth with the patient the nature of atherosclerosis, and emphasized the importance of maximal medical management including strict control of blood pressure, blood glucose, and lipid levels, obtaining regular exercise, and continued cessation of smoking.  The patient is aware that without maximal medical management the underlying atherosclerotic disease process will progress, limiting the benefit of any interventions. The patient was given information about stroke prevention and what symptoms should prompt the patient to seek immediate medical care. Thank you for allowing [place] to participate in this patient's care.  Clemon Chambers, RN, MSN, FNP-C [facility] Office: [phone]  Clinic Physician: Early  01/10/17 11:41 AM

## 2017-01-18 DIAGNOSIS — R011 Cardiac murmur, unspecified: Secondary | ICD-10-CM | POA: Insufficient documentation

## 2017-01-18 DIAGNOSIS — M1990 Unspecified osteoarthritis, unspecified site: Secondary | ICD-10-CM | POA: Insufficient documentation

## 2017-01-18 DIAGNOSIS — I10 Essential (primary) hypertension: Secondary | ICD-10-CM | POA: Insufficient documentation

## 2017-01-18 DIAGNOSIS — R238 Other skin changes: Secondary | ICD-10-CM | POA: Insufficient documentation

## 2017-01-18 DIAGNOSIS — H53453 Other localized visual field defect, bilateral: Secondary | ICD-10-CM | POA: Insufficient documentation

## 2017-01-18 DIAGNOSIS — R233 Spontaneous ecchymoses: Secondary | ICD-10-CM | POA: Insufficient documentation

## 2017-01-18 DIAGNOSIS — R0600 Dyspnea, unspecified: Secondary | ICD-10-CM | POA: Insufficient documentation

## 2017-01-18 DIAGNOSIS — H9190 Unspecified hearing loss, unspecified ear: Secondary | ICD-10-CM | POA: Insufficient documentation

## 2017-01-18 DIAGNOSIS — R350 Frequency of micturition: Secondary | ICD-10-CM | POA: Insufficient documentation

## 2017-02-02 NOTE — Progress Notes (Signed)
Evan [name] Date of Birth: April 23, 1940   History of Present Illness: Evan [name] is seen today for follow up CAD and Afib. He has a history of coronary disease and is status post coronary bypass surgery in 2005. He had a cardiac catheterization 2007 which demonstrated his grafts were patent. He had a nuclear stress test in January of 2013 which demonstrated no significant perfusion abnormalities. His ejection fraction was estimated 46% but visually it appeared normal. He underwent a left carotid endarterectomy in May 2014. He was intolerant to statins due to severe myalgias and weakness.  He has permanent Atrial fibrillation . Due to his slow HR metoprolol dose was stopped.   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 His anticoagulation was held and he was treated with ASA before later starting on Eliquis. (was on Xarelto before).  It was recommended that he have a sleep study following his CVA to rule out sleep apnea. Overnight oximetry showed drop in sat to <89 for one hour. A formal sleep study was performed in October. It showed only borderline sleep apnea with minimal desaturation. Sleep efficiency was poor so sleep apnea may have been underestimated.   He was seen  by GI for evaluation of heme positive stool. Colonoscopy recommended but patient  refused. Hgb 11.7- down from 13.3. No obvious bleeding.   He denies any chest pain,  edema, dizziness, or palpitations. He does complain of more SOB with exertion particularly in the morning. Feels energy level OK. Sometimes wakes at night with pain and a knot in his biceps area- can affect both sides.   Current Outpatient Medications on File Prior to Visit  Medication Sig Dispense Refill  . acetaminophen (TYLENOL) 500 MG tablet Take 650 mg by mouth every 6 (six) hours as needed for mild pain or moderate pain.     Marland Kitchen apixaban (ELIQUIS) 5 MG TABS tablet Take 1 tablet (5 mg total)  by mouth 2 (two) times daily. 60 tablet 0  . benazepril (LOTENSIN) 40 MG tablet Take 1 tablet (40 mg total) by mouth daily. 90 tablet 3  . Cyanocobalamin (RA VITAMIN B-12 TR) 1000 MCG TBCR Take 1 tablet by mouth daily.     . fluticasone (FLONASE) 50 MCG/ACT nasal spray Place 2 sprays into both nostrils daily.    Marland Kitchen glimepiride (AMARYL) 2 MG tablet Take 2 mg by mouth daily with breakfast.    . hydrochlorothiazide (HYDRODIURIL) 25 MG tablet Take 25 mg by mouth every morning.     Marland Kitchen OVER THE COUNTER MEDICATION Replenex tablets: Take 3 tablet by mouth every morning (joint health)    . rosuvastatin (CRESTOR) 20 MG tablet Take 1 tablet (20 mg total) by mouth daily. 30 tablet 0  . tamsulosin (FLOMAX) 0.4 MG CAPS capsule TAKE 1 CAPSULE BY MOUTH ONCE DAILY 90 capsule 3   No current facility-administered medications on file prior to visit.     Allergies  Allergen Reactions  . Niacin And Related Rash    Red face and burns.  . Metformin And Related Diarrhea  . Statins     Myalgias, weakness    Past Medical History:  Diagnosis Date  . Bruises easily   . Carotid arterial disease ([place])   . Coronary artery disease   . Diabetes mellitus   . Dyspnea    with exertion  . Frequency of urination   . Heart murmur   . HOH (hard  of hearing)   . Hyperlipidemia   . Hypertension   . Obesity   . Osteoarthritis    Left wrist  . Peripheral vision loss, bilateral   . Stroke ([place]) 07/2015  . Torn rotator cuff 2012    Right arm    Past Surgical History:  Procedure Laterality Date  . BACK SURGERY     X2  . CARDIAC CATHETERIZATION  11/29/2005   SEVERE THREE VESSEL OBSTRUCTIVE ATHERSCLEROTIC CAD. ALL GRAFTS WERE PATENT. EF 65%  . CAROTID ENDARTERECTOMY Left 05-28-12   cea  . CAROTID ENDARTERECTOMY Right 01-29-08   cea  . CORONARY ARTERY BYPASS GRAFT  03/2003   X3. LIMA GRAFT TO THE LAD, SAPHENOUS VEIN GRAFT TO THE PA, AND A LEFT RADIAL GRAFT TO THEOBTUSE MARGINAL VESSEL  . ENDARTERECTOMY Left 05/28/2012    Procedure: ENDARTERECTOMY CAROTID;  Surgeon: Elam Dutch, MD;  Location: [facility];  Service: Vascular;  Laterality: Left;  . MOLE SURGERY    . PATCH ANGIOPLASTY Left 05/28/2012   Procedure: WITH dACRON PATCH ANGIOPLASTY;  Surgeon: Elam Dutch, MD;  Location: [facility] OR;  Service: Vascular;  Laterality: Left;    Social History   Tobacco Use  Smoking Status Former Smoker  . Last attempt to quit: 01/24/1994  . Years since quitting: 23.0  Smokeless Tobacco Former Systems developer    Social History   Substance and Sexual Activity  Alcohol Use No    Family History  Problem Relation Age of Onset  . Heart attack Father   . Heart attack Brother   . Parkinsonism Brother   . Cancer Brother   . Arrhythmia Brother     Review of Systems: As noted in HPI. All other systems were reviewed and are negative.  Physical Exam: BP (!) 146/67   Pulse (!) 56   Ht 6' (1.829 m)   Wt 267 lb 12.8 oz (121.5 kg)   SpO2 95%   BMI 36.32 kg/m  GENERAL:  Well appearing, obese WM in NAD HEENT:  PERRL, EOMI, sclera are clear. Oropharynx is clear. NECK:  No jugular venous distention, carotid upstroke brisk and symmetric, no bruits, no thyromegaly or adenopathy LUNGS:  Clear to auscultation bilaterally CHEST:  Unremarkable HEART:  IRRR,  PMI not displaced or sustained,S1 and S2 within normal limits, no S3, no S4: no clicks, no rubs, gr 2/6 harsh systolic murmur RUSB>> LSB ABD:  Soft, nontender. BS +, no masses or bruits. No hepatomegaly, no splenomegaly EXT:  2 + pulses throughout, no edema, no cyanosis no clubbing SKIN:  Warm and dry.  No rashes NEURO:  Alert and oriented x 3. Cranial nerves II through XII intact. PSYCH:  Cognitively intact    LABORATORY DATA: Lab Results  Component Value Date   WBC 8.4 08/26/2015   HGB 13.5 08/26/2015   HCT 42.1 08/26/2015   PLT 192 08/26/2015   GLUCOSE 146 (H) 12/11/2015   CHOL 100 12/11/2015   TRIG 59 12/11/2015   HDL 44 12/11/2015   LDLCALC 44 12/11/2015   ALT 19  12/11/2015   AST 22 12/11/2015   NA 140 12/11/2015   K 4.5 12/11/2015   CL 104 12/11/2015   CREATININE 0.96 12/11/2015   BUN 22 12/11/2015   CO2 27 12/11/2015   INR 1.20 08/24/2015   HGBA1C 6.7 (H) 08/25/2015   Labs dated 10/19/16: glucose 122, otherwise CMET normal.  Dated 10/19/16: Hgb 11.5. A1c 7.2 Dated 06/03/16: cholesterol 97, triglycerides 52, HDL 40, LDL 47   Echo 08/25/15: Study  Conclusions  - Left ventricle: The cavity size was normal. Wall thickness was   increased in a pattern of severe LVH. Systolic function was   normal. The estimated ejection fraction was in the range of 50%   to 55%. Wall motion was normal; there were no regional wall   motion abnormalities. The study is not technically sufficient to   allow evaluation of LV diastolic function. - Aortic valve: Moderately calcified. Moderate stenosis. Trivial   regurgitation. Mean gradient (S): 26 mm Hg. Peak gradient (S): 50   mm Hg. Valve area (VTI): 0.93 cm^2. Valve area (Vmax): 1.01 cm^2.   Valve area (Vmean): 0.96 cm^2. - Mitral valve: Calcified annulus. Mildly thickened leaflets .   There was trivial regurgitation. - Left atrium: Severely dilated. - Systemic veins: The IVC measures >2.1 cm, but collapses >50%,   suggesting an elevated RA pressure of 8 mmHg.  Impressions:  - Compared to a prior study in 2015, there is now moderate to   severe aortic stenosis - mean gradient has increased from 21 to   26 mmHg. A-fib is present in this study.  Assessment / Plan: 1. Coronary disease status post CABG in 2005. He has stable class I anginal symptoms. No recent ischemia evaluation.  We'll continue with his medical therapy including metoprolol, and ACE inhibitor, statin therapy.   2. Hyperlipidemia, intolerant to statins in the past. Now on Zetia and Crestor.  Has only minor arthralgias. Will update labs today.  3. Diabetes mellitus type 2.  Managed by primary care.   4. Hypertension - BP OK.  Follow.  5.  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  On no rate slowing meds. Now with AFib and slow ventricular response. Holter monitor in Feb 2017 showed AFib with controlled rate. Some bradycardia while sleeping. Longest pause 3.3 seconds. I have recommended repeating 24 hours holter monitor.   6. Carotid arterial disease status post left endarterectomy.  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 CTA of head and neck in August 2017 showed patent LCEA and occluded right vertebral. Recent carotid dopplers in December 2018 showed patent CEA sites.   7. Atrial fibrillation- now permanent with slow rate.  Continue Eliquis 5 mg bid. Avoid NSAIDs.  On no rate slowing medication due to bradycardia.   8.  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 Now on Eliquis.   9. Mild OSA seen on sleep study.   10. Heme positive stool with mild anemia. Will update CBC today. If Hgb has dropped further he will definitely need colonoscopy.  Follow up in 6 months.

## 2017-02-07 ENCOUNTER — Encounter: Payer: Self-pay | Admitting: Cardiology

## 2017-02-07 ENCOUNTER — Ambulatory Visit (INDEPENDENT_AMBULATORY_CARE_PROVIDER_SITE_OTHER): Payer: Medicare Other | Admitting: Cardiology

## 2017-02-07 VITALS — BP 146/67 | HR 56 | Ht 72.0 in | Wt 267.8 lb

## 2017-02-07 DIAGNOSIS — I453 Trifascicular block: Secondary | ICD-10-CM | POA: Diagnosis not present

## 2017-02-07 DIAGNOSIS — I209 Angina pectoris, unspecified: Secondary | ICD-10-CM

## 2017-02-07 DIAGNOSIS — I481 Persistent atrial fibrillation: Secondary | ICD-10-CM

## 2017-02-07 DIAGNOSIS — I25709 Atherosclerosis of coronary artery bypass graft(s), unspecified, with unspecified angina pectoris: Secondary | ICD-10-CM

## 2017-02-07 DIAGNOSIS — E785 Hyperlipidemia, unspecified: Secondary | ICD-10-CM | POA: Diagnosis not present

## 2017-02-07 DIAGNOSIS — I6523 Occlusion and stenosis of bilateral carotid arteries: Secondary | ICD-10-CM | POA: Diagnosis not present

## 2017-02-07 DIAGNOSIS — I4819 Other persistent atrial fibrillation: Secondary | ICD-10-CM

## 2017-02-07 MED ORDER — EZETIMIBE 10 MG PO TABS: 10.0000 mg | ORAL_TABLET | Freq: Every day | ORAL | 11 refills | Status: DC

## 2017-02-07 NOTE — Patient Instructions (Signed)
We will check blood work today  We will have you wear a monitor for 24 hours.   Continue your current therapy

## 2017-02-08 LAB — CBC WITH DIFFERENTIAL/PLATELET
Basophils Absolute: 0 10*3/uL (ref 0.0–0.2)
Basos: 0 %
EOS (ABSOLUTE): 0.1 10*3/uL (ref 0.0–0.4)
Eos: 2 %
Hematocrit: 36.1 % — ABNORMAL LOW (ref 37.5–51.0)
Hemoglobin: 12.2 g/dL — ABNORMAL LOW (ref 13.0–17.7)
IMMATURE GRANULOCYTES: 0 %
Immature Grans (Abs): 0 10*3/uL (ref 0.0–0.1)
LYMPHS ABS: 1.4 10*3/uL (ref 0.7–3.1)
Lymphs: 28 %
MCH: 31.7 pg (ref 26.6–33.0)
MCHC: 33.8 g/dL (ref 31.5–35.7)
MCV: 94 fL (ref 79–97)
MONOS ABS: 0.3 10*3/uL (ref 0.1–0.9)
Monocytes: 7 %
NEUTROS ABS: 3.2 10*3/uL (ref 1.4–7.0)
NEUTROS PCT: 63 %
PLATELETS: 225 10*3/uL (ref 150–379)
RBC: 3.85 x10E6/uL — ABNORMAL LOW (ref 4.14–5.80)
RDW: 15.2 % (ref 12.3–15.4)
WBC: 5.2 10*3/uL (ref 3.4–10.8)

## 2017-02-08 LAB — HEPATIC FUNCTION PANEL
ALBUMIN: 4.7 g/dL (ref 3.5–4.8)
ALK PHOS: 96 IU/L (ref 39–117)
ALT: 22 IU/L (ref 0–44)
AST: 22 IU/L (ref 0–40)
BILIRUBIN TOTAL: 0.6 mg/dL (ref 0.0–1.2)
Bilirubin, Direct: 0.22 mg/dL (ref 0.00–0.40)
TOTAL PROTEIN: 7.4 g/dL (ref 6.0–8.5)

## 2017-02-08 LAB — LIPID PANEL W/O CHOL/HDL RATIO
Cholesterol, Total: 116 mg/dL (ref 100–199)
HDL: 48 mg/dL (ref >39)
LDL Calculated: 52 mg/dL (ref 0–99)
Triglycerides: 81 mg/dL (ref 0–149)
VLDL Cholesterol Cal: 16 mg/dL (ref 5–40)

## 2017-02-08 LAB — BASIC METABOLIC PANEL
BUN / CREAT RATIO: 17 (ref 10–24)
BUN: 20 mg/dL (ref 8–27)
CHLORIDE: 103 mmol/L (ref 96–106)
CO2: 21 mmol/L (ref 20–29)
Calcium: 9.3 mg/dL (ref 8.6–10.2)
Creatinine, Ser: 1.21 mg/dL (ref 0.76–1.27)
GFR calc non Af Amer: 58 mL/min/{1.73_m2} — ABNORMAL LOW (ref >59)
GFR, EST AFRICAN AMERICAN: 67 mL/min/{1.73_m2} (ref >59)
Glucose: 117 mg/dL — ABNORMAL HIGH (ref 65–99)
POTASSIUM: 4.8 mmol/L (ref 3.5–5.2)
SODIUM: 142 mmol/L (ref 134–144)

## 2017-02-08 LAB — TSH: TSH: 0.855 u[IU]/mL (ref 0.450–4.500)

## 2017-02-09 ENCOUNTER — Telehealth: Payer: Self-pay | Admitting: Cardiology

## 2017-02-09 NOTE — Telephone Encounter (Signed)
Mrs. Fangman is returning a call about Mr. Streater lab results . Please call

## 2017-02-09 NOTE — Telephone Encounter (Signed)
Advised wife, ok per DPR  

## 2017-02-09 NOTE — Telephone Encounter (Signed)
-----   Message from [name] M [name], MD sent at 02/08/2017  7:16 AM EST ----- Hgb has improved from 11.5>>12.2. Chemistries, TSH, and lipids all look great.  [name] [name] MD, [facility]

## 2017-02-15 ENCOUNTER — Ambulatory Visit (INDEPENDENT_AMBULATORY_CARE_PROVIDER_SITE_OTHER): Payer: Medicare Other

## 2017-02-15 DIAGNOSIS — E785 Hyperlipidemia, unspecified: Secondary | ICD-10-CM | POA: Diagnosis not present

## 2017-02-15 DIAGNOSIS — I6523 Occlusion and stenosis of bilateral carotid arteries: Secondary | ICD-10-CM | POA: Diagnosis not present

## 2017-02-15 DIAGNOSIS — I481 Persistent atrial fibrillation: Secondary | ICD-10-CM

## 2017-02-15 DIAGNOSIS — I453 Trifascicular block: Secondary | ICD-10-CM

## 2017-02-15 DIAGNOSIS — I25709 Atherosclerosis of coronary artery bypass graft(s), unspecified, with unspecified angina pectoris: Secondary | ICD-10-CM

## 2017-02-15 DIAGNOSIS — I4819 Other persistent atrial fibrillation: Secondary | ICD-10-CM

## 2017-02-16 ENCOUNTER — Telehealth: Payer: Self-pay | Admitting: Cardiology

## 2017-02-16 NOTE — Telephone Encounter (Signed)
Received monitor report which revealed 5 sec pause this morning at 12:04 am.Monitor report was shown to DOD Dr.Croitoru, he advised patient does meet the criteria for a pacemaker.Wife was called she stated patient has been feeling weak,does not feel like doing anything.He has not blacked out.Stated he gets up several times during night.Advised Dr.Jordan will review monitor in morning and I call back with his recommendations.Advised if patient feels bad tonight go on to ED.

## 2017-02-16 NOTE — Telephone Encounter (Signed)
New message  Evan [name] from [place] verbalized that she is calling for the RN  To give a critical report for pt

## 2017-02-16 NOTE — Telephone Encounter (Signed)
Received a call from [place] with [facility] calling to report patient's monitor revealed a 5 sec pause.She will fax report for Dr.Jordan to review.

## 2017-02-17 NOTE — Telephone Encounter (Signed)
Returned call to patient's wife.Dr.Jordan's recommendations given.Advised EP scheduler will call today with appointment.

## 2017-02-17 NOTE — Telephone Encounter (Signed)
Holter shows Afib with slow ventricular response. Longest pause 5.08 seconds. This is worse compared to 2017. He clearly needs a pacemaker. Recommend referral to EP ASAP.  Vipul Cafarelli [name] MD, [facility]

## 2017-02-21 ENCOUNTER — Ambulatory Visit (INDEPENDENT_AMBULATORY_CARE_PROVIDER_SITE_OTHER): Payer: Medicare Other | Admitting: Cardiology

## 2017-02-21 ENCOUNTER — Encounter: Payer: Self-pay | Admitting: Cardiology

## 2017-02-21 VITALS — BP 158/76 | HR 50 | Ht 72.0 in | Wt 268.0 lb

## 2017-02-21 DIAGNOSIS — I4819 Other persistent atrial fibrillation: Secondary | ICD-10-CM

## 2017-02-21 DIAGNOSIS — I10 Essential (primary) hypertension: Secondary | ICD-10-CM

## 2017-02-21 DIAGNOSIS — I25810 Atherosclerosis of coronary artery bypass graft(s) without angina pectoris: Secondary | ICD-10-CM

## 2017-02-21 DIAGNOSIS — I481 Persistent atrial fibrillation: Secondary | ICD-10-CM

## 2017-02-21 DIAGNOSIS — I350 Nonrheumatic aortic (valve) stenosis: Secondary | ICD-10-CM | POA: Diagnosis not present

## 2017-02-21 DIAGNOSIS — I482 Chronic atrial fibrillation, unspecified: Secondary | ICD-10-CM

## 2017-02-21 NOTE — Patient Instructions (Signed)
Medication Instructions:  Your physician recommends that you continue on your current medications as directed. Please refer to the Current Medication list given to you today.  * If you need a refill on your cardiac medications before your next appointment, please call your pharmacy. *  Labwork: None ordered  Testing/Procedures: Your physician has requested that you have an echocardiogram. Echocardiography is a painless test that uses sound waves to create images of your heart. It provides your doctor with information about the size and shape of your heart and how well your heart's chambers and valves are working. This procedure takes approximately one hour. There are no restrictions for this procedure.  Follow-Up: Follow up to be determined once echocardiogram has been completed and reviewed.  Thank you for choosing [facility]!!   Trinidad Curet, RN [phone]

## 2017-02-21 NOTE — Progress Notes (Signed)
Electrophysiology Office Note   Date:  02/21/2017   ID:  Sanjay, Broadfoot 12/22/1940, MRN [id number]  PCP:  Leonel Ramsay, MD  Cardiologist:  [name] Primary Electrophysiologist:  Constance Haw, MD    Chief Complaint  Patient presents with  . Advice Only    Persistent Afib/Pauses on monitor     History of Present Illness: PAXTYN BOYAR is a 77 y.o. male who is being seen today for the evaluation of atrial fibrillation at the request of [name] [name]. Presenting today for electrophysiology evaluation.  Is a history of coronary artery disease and atrial fibrillation.  He is status post coronary artery bypass in 2005.  He had a cath in 2007 with patent grafts.  He underwent left carotid endarterectomy May 2014.  He has been intolerant of statins due to myalgias and weakness.  He has permanent atrial fibrillation.  His heart rate has been slow in the past and thus metoprolol has been stopped.    Today, he denies symptoms of palpitations, chest pain, orthopnea, PND, lower extremity edema, claudication, dizziness, presyncope, syncope, bleeding, or neurologic sequela. The patient is tolerating medications without difficulties.  Symptoms are of dyspnea with exertion.  He can walk across a parking lot and gets quite short of breath.  He does not have symptoms of PND or orthopnea.   Past Medical History:  Diagnosis Date  . Bruises easily   . Carotid arterial disease ([place])   . Coronary artery disease   . Diabetes mellitus   . Dyspnea    with exertion  . Frequency of urination   . Heart murmur   . HOH (hard of hearing)   . Hyperlipidemia   . Hypertension   . Obesity   . Osteoarthritis    Left wrist  . Peripheral vision loss, bilateral   . Stroke ([place]) 07/2015  . Torn rotator cuff 2012    Right arm   Past Surgical History:  Procedure Laterality Date  . BACK SURGERY     X2  . CARDIAC CATHETERIZATION  11/29/2005   SEVERE THREE VESSEL OBSTRUCTIVE ATHERSCLEROTIC CAD.  ALL GRAFTS WERE PATENT. EF 65%  . CAROTID ENDARTERECTOMY Left 05-28-12   cea  . CAROTID ENDARTERECTOMY Right 01-29-08   cea  . CORONARY ARTERY BYPASS GRAFT  03/2003   X3. LIMA GRAFT TO THE LAD, SAPHENOUS VEIN GRAFT TO THE PA, AND A LEFT RADIAL GRAFT TO THEOBTUSE MARGINAL VESSEL  . ENDARTERECTOMY Left 05/28/2012   Procedure: ENDARTERECTOMY CAROTID;  Surgeon: Elam Dutch, MD;  Location: [place];  Service: Vascular;  Laterality: Left;  . MOLE SURGERY    . PATCH ANGIOPLASTY Left 05/28/2012   Procedure: WITH dACRON PATCH ANGIOPLASTY;  Surgeon: Elam Dutch, MD;  Location: [facility] OR;  Service: Vascular;  Laterality: Left;     Current Outpatient Medications  Medication Sig Dispense Refill  . acetaminophen (TYLENOL) 500 MG tablet Take 650 mg by mouth every 6 (six) hours as needed for mild pain or moderate pain.     Marland Kitchen apixaban (ELIQUIS) 5 MG TABS tablet Take 1 tablet (5 mg total) by mouth 2 (two) times daily. 60 tablet 0  . benazepril (LOTENSIN) 40 MG tablet Take 1 tablet (40 mg total) by mouth daily. 90 tablet 3  . Cyanocobalamin (RA VITAMIN B-12 TR) 1000 MCG TBCR Take 1 tablet by mouth daily.     Marland Kitchen ezetimibe (ZETIA) 10 MG tablet Take 1 tablet (10 mg total) by mouth daily. 30 tablet 11  .  fluticasone (FLONASE) 50 MCG/ACT nasal spray Place 2 sprays into both nostrils daily.    Marland Kitchen glimepiride (AMARYL) 2 MG tablet Take 2 mg by mouth daily with breakfast.    . hydrochlorothiazide (HYDRODIURIL) 25 MG tablet Take 25 mg by mouth every morning.     Marland Kitchen OVER THE COUNTER MEDICATION Replenex tablets: Take 3 tablet by mouth every morning (joint health)    . rosuvastatin (CRESTOR) 20 MG tablet Take 1 tablet (20 mg total) by mouth daily. 30 tablet 0  . tamsulosin (FLOMAX) 0.4 MG CAPS capsule TAKE 1 CAPSULE BY MOUTH ONCE DAILY 90 capsule 3   No current facility-administered medications for this visit.     Allergies:   Niacin and related; Metformin and related; and Statins   Social History:  The patient  reports  that he quit smoking about 23 years ago. He has quit using smokeless tobacco. He reports that he does not drink alcohol or use drugs.   Family History:  The patient's family history includes Arrhythmia in his brother; Cancer in his brother; Heart attack in his brother and father; Parkinsonism in his brother.    ROS:  Please see the history of present illness.   Otherwise, review of systems is positive for fatigue, leg swelling, leg pain, palpitations, hearing loss, visual disturbance, shortness of breath, anxiety, muscle pain, balance problems, dizziness, easy bruising.   All other systems are reviewed and negative.    PHYSICAL EXAM: VS:  BP (!) 158/76   Pulse (!) 50   Ht 6' (1.829 m)   Wt 268 lb (121.6 kg)   BMI 36.35 kg/m  , BMI Body mass index is 36.35 kg/m. GEN: Well nourished, well developed, in no acute distress  HEENT: normal  Neck: no JVD, carotid bruits, or masses Cardiac: iRRR; 2/6 systolic murmur at the base, no rubs, or gallops,no edema  Respiratory:  clear to auscultation bilaterally, normal work of breathing GI: soft, nontender, nondistended, + BS MS: no deformity or atrophy  Skin: warm and dry Neuro:  Strength and sensation are intact Psych: euthymic mood, full affect  EKG:  EKG is ordered today. Personal review of the ekg ordered shows fibrillation, right bundle branch block, left anterior fascicular block, rate 50  Recent Labs: 02/07/2017: ALT 22; BUN 20; Creatinine, Ser 1.21; Hemoglobin 12.2; Platelets 225; Potassium 4.8; Sodium 142; TSH 0.855    Lipid Panel     Component Value Date/Time   CHOL 116 02/07/2017 1516   TRIG 81 02/07/2017 1516   HDL 48 02/07/2017 1516   CHOLHDL 2.3 12/11/2015 0854   VLDL 12 12/11/2015 0854   LDLCALC 52 02/07/2017 1516     Wt Readings from Last 3 Encounters:  02/21/17 268 lb (121.6 kg)  02/07/17 267 lb 12.8 oz (121.5 kg)  01/10/17 270 lb 6.4 oz (122.7 kg)      Other studies Reviewed: Additional studies/ records that  were reviewed today include: TTE 08/25/15  Review of the above records today demonstrates:  - Left ventricle: The cavity size was normal. Wall thickness was   increased in a pattern of severe LVH. Systolic function was   normal. The estimated ejection fraction was in the range of 50%   to 55%. Wall motion was normal; there were no regional wall   motion abnormalities. The study is not technically sufficient to   allow evaluation of LV diastolic function. - Aortic valve: Moderately calcified. Moderate stenosis. Trivial   regurgitation. Mean gradient (S): 26 mm Hg. Peak gradient (S):  50   mm Hg. Valve area (VTI): 0.93 cm^2. Valve area (Vmax): 1.01 cm^2.   Valve area (Vmean): 0.96 cm^2. - Mitral valve: Calcified annulus. Mildly thickened leaflets .   There was trivial regurgitation. - Left atrium: Severely dilated. - Systemic veins: The IVC measures >2.1 cm, but collapses >50%,   suggesting an elevated RA pressure of 8 mmHg.  Holter 02/20/17 - personally reviewed  Atrial fibrillation with slow ventricular response  Frequent pauses- longest 5.08 seconds  Occasional PVCs  ASSESSMENT AND PLAN:  1.  Atrial fibrillation, permanent: Currently on Eliquis without rate controlling medications due to bradycardia.  I would imagine that he Valicia Rief need a pacemaker in the near future due to his significant conduction system disease and slow heart rates.  Risks and benefits were discussed.  Risks include bleeding, tamponade, infection, and pneumothorax.  The patient understands these risks and has agreed to the procedure.  Prior to pacemaker implantation, due to his shortness of breath, we Ginny Loomer get a repeat echocardiogram.  2.  Coronary artery disease: Status post CABG 2005.  Has class I angina.  3.  Moderate to severe aortic stenosis: Per echocardiogram in 2017.  He is having significant shortness of breath.  This is possibly due to aortic stenosis, which has not been evaluated in the last 2 years.  We  Jayliah Benett repeat a transthoracic echo prior to possible pacemaker implantation.  4.  Hypertension: Pressure is elevated today.  Has been closer to normal in the past.  We Dymond Spreen not adjust medications at this time prior to echocardiogram.  5.  CVA with hemorrhagic transformation: Currently on Eliquis  Current medicines are reviewed at length with the patient today.   The patient does not have concerns regarding his medicines.  The following changes were made today:  none  Labs/ tests ordered today include:  Orders Placed This Encounter  Procedures  . EKG 12-Lead  . ECHOCARDIOGRAM COMPLETE     Disposition:   FU with Siham Bucaro 3 months  Signed, Adalynne Steffensmeier Meredith Leeds, MD  02/21/2017 2:15 PM     [address] [place] [address] [phone] (office) [phone] (fax)

## 2017-03-01 ENCOUNTER — Ambulatory Visit (HOSPITAL_COMMUNITY): Payer: Medicare Other | Attending: Cardiology

## 2017-03-01 ENCOUNTER — Other Ambulatory Visit: Payer: Self-pay

## 2017-03-01 DIAGNOSIS — E785 Hyperlipidemia, unspecified: Secondary | ICD-10-CM | POA: Diagnosis not present

## 2017-03-01 DIAGNOSIS — E669 Obesity, unspecified: Secondary | ICD-10-CM | POA: Insufficient documentation

## 2017-03-01 DIAGNOSIS — I2510 Atherosclerotic heart disease of native coronary artery without angina pectoris: Secondary | ICD-10-CM | POA: Insufficient documentation

## 2017-03-01 DIAGNOSIS — I10 Essential (primary) hypertension: Secondary | ICD-10-CM | POA: Diagnosis not present

## 2017-03-01 DIAGNOSIS — Z6836 Body mass index (BMI) 36.0-36.9, adult: Secondary | ICD-10-CM | POA: Diagnosis not present

## 2017-03-01 DIAGNOSIS — E119 Type 2 diabetes mellitus without complications: Secondary | ICD-10-CM | POA: Diagnosis not present

## 2017-03-01 DIAGNOSIS — I482 Chronic atrial fibrillation, unspecified: Secondary | ICD-10-CM

## 2017-03-01 DIAGNOSIS — I350 Nonrheumatic aortic (valve) stenosis: Secondary | ICD-10-CM | POA: Diagnosis present

## 2017-03-01 DIAGNOSIS — Z8673 Personal history of transient ischemic attack (TIA), and cerebral infarction without residual deficits: Secondary | ICD-10-CM | POA: Diagnosis not present

## 2017-03-01 DIAGNOSIS — R0600 Dyspnea, unspecified: Secondary | ICD-10-CM | POA: Diagnosis not present

## 2017-03-01 MED ORDER — PERFLUTREN LIPID MICROSPHERE
1.0000 mL | INTRAVENOUS | Status: AC | PRN
  Administered 2017-03-01: 2 mL via INTRAVENOUS

## 2017-03-03 ENCOUNTER — Telehealth: Payer: Self-pay | Admitting: Cardiology

## 2017-03-03 ENCOUNTER — Encounter: Payer: Self-pay | Admitting: Physician Assistant

## 2017-03-03 NOTE — Telephone Encounter (Signed)
Echo completed on 03/01/17. Has been read. Pending MD review.   Routed to [facility] LPN

## 2017-03-03 NOTE — Telephone Encounter (Signed)
New Message    *STAT* If patient is at the pharmacy, call can be transferred to refill team.   1. Which medications need to be refilled? (please list name of each medication and dose if known) hydrochlorothiazide (HYDRODIURIL) 25 MG tablet 2. Which pharmacy/location (including street and city if local pharmacy) is medication to be sent to? [facility] [address] [facility] in [place]  3. Do they need a 30 day or 90 day supply? [place]

## 2017-03-03 NOTE — Telephone Encounter (Signed)
[place] patients wife is calling on behalf of spouse. Mak is hard of hearing so please speak with wife. She is inquiring about his echocardiogram results.

## 2017-03-03 NOTE — Telephone Encounter (Signed)
Returned call to patient's wife advised Echo results not available at present.Advised I will call with results when available.

## 2017-03-06 ENCOUNTER — Ambulatory Visit (INDEPENDENT_AMBULATORY_CARE_PROVIDER_SITE_OTHER): Payer: Medicare Other | Admitting: Cardiovascular Disease

## 2017-03-06 ENCOUNTER — Encounter: Payer: Self-pay | Admitting: Cardiovascular Disease

## 2017-03-06 ENCOUNTER — Other Ambulatory Visit: Payer: Self-pay | Admitting: *Deleted

## 2017-03-06 VITALS — BP 142/78 | HR 60 | Ht 72.0 in | Wt 273.2 lb

## 2017-03-06 DIAGNOSIS — I350 Nonrheumatic aortic (valve) stenosis: Secondary | ICD-10-CM

## 2017-03-06 DIAGNOSIS — I6523 Occlusion and stenosis of bilateral carotid arteries: Secondary | ICD-10-CM

## 2017-03-06 LAB — BASIC METABOLIC PANEL
BUN/Creatinine Ratio: 15 (ref 10–24)
BUN: 19 mg/dL (ref 8–27)
CHLORIDE: 102 mmol/L (ref 96–106)
CO2: 22 mmol/L (ref 20–29)
CREATININE: 1.24 mg/dL (ref 0.76–1.27)
Calcium: 9.4 mg/dL (ref 8.6–10.2)
GFR calc Af Amer: 65 mL/min/{1.73_m2} (ref >59)
GFR calc non Af Amer: 57 mL/min/{1.73_m2} — ABNORMAL LOW (ref >59)
GLUCOSE: 180 mg/dL — AB (ref 65–99)
POTASSIUM: 4.7 mmol/L (ref 3.5–5.2)
SODIUM: 137 mmol/L (ref 134–144)

## 2017-03-06 LAB — CBC
Hematocrit: 35.9 % — ABNORMAL LOW (ref 37.5–51.0)
Hemoglobin: 11.8 g/dL — ABNORMAL LOW (ref 13.0–17.7)
MCH: 31.2 pg (ref 26.6–33.0)
MCHC: 32.9 g/dL (ref 31.5–35.7)
MCV: 95 fL (ref 79–97)
PLATELETS: 194 10*3/uL (ref 150–379)
RBC: 3.78 x10E6/uL — AB (ref 4.14–5.80)
RDW: 14.9 % (ref 12.3–15.4)
WBC: 6.1 10*3/uL (ref 3.4–10.8)

## 2017-03-06 LAB — PROTIME-INR
INR: 1.1 (ref 0.8–1.2)
PROTHROMBIN TIME: 11.5 s (ref 9.1–12.0)

## 2017-03-06 MED ORDER — HYDROCHLOROTHIAZIDE 25 MG PO TABS: 25.0000 mg | ORAL_TABLET | Freq: Every morning | ORAL | 3 refills | Status: DC

## 2017-03-06 MED ORDER — HYDROCHLOROTHIAZIDE 25 MG PO TABS: 25.0000 mg | ORAL_TABLET | Freq: Every morning | ORAL | 1 refills | Status: DC

## 2017-03-06 NOTE — Addendum Note (Signed)
Addended by: Waylan Rocher on: 03/06/2017 11:02 AM   Modules accepted: Orders

## 2017-03-06 NOTE — Patient Instructions (Signed)
Medication Instructions:  Your physician recommends that you continue on your current medications as directed. Please refer to the Current Medication list given to you today.  Labwork: Your physician recommends that you have lab work today: BMP, CBC and PT/INR  Testing/Procedures: Your physician has requested that you have a cardiac catheterization. Cardiac catheterization is used to diagnose and/or treat various heart conditions. Doctors may recommend this procedure for a number of different reasons. The most common reason is to evaluate chest pain. Chest pain can be a symptom of coronary artery disease (CAD), and cardiac catheterization can show whether plaque is narrowing or blocking your heart's arteries. This procedure is also used to evaluate the valves, as well as measure the blood flow and oxygen levels in different parts of your heart. For further information please visit [email or web address]. Please follow instruction sheet, as given.  Your physician has recommended that you have a pacemaker inserted. A pacemaker is a small device that is placed under the skin of your chest or abdomen to help control abnormal heart rhythms. This device uses electrical pulses to prompt the heart to beat at a normal rate. Pacemakers are used to treat heart rhythms that are too slow. Wire (leads) are attached to the pacemaker that goes into the chambers of you heart. This is done in the hospital and usually requires and overnight stay. Please see the instruction sheet given to you today for more information.  Follow-Up: We will contact you by phone with additional appointments.   Any Other Special Instructions Will Be Listed Below (If Applicable).     If you need a refill on your cardiac medications before your next appointment, please call your pharmacy.

## 2017-03-06 NOTE — H&P (View-Only) (Signed)
Cardiology Office Note Date:  03/06/2017   ID:  Evan [name], DOB 1940-03-28, MRN [id number]  PCP:  Leonel Ramsay, MD  Cardiologist:  [name] [name], MD Electrophysiologist: Dr Curt Bears  Chief Complaint  Patient presents with  . Shortness of Breath   History of Present Illness: Evan [name] is a 77 y.o. male who presents for evaluation of severe aortic stenosis.  The patient has a history of coronary artery disease and under multivessel CABG in 2005.  He has permanent atrial fibrillation.  He has a history of stroke in 2017 with hemorrhagic transformation but has tolerated apixaban.  He also has a history of GI bleeding with heme positive stool.  His hemoglobin was only mildly reduced at 11.7 and the patient had GI evaluation but refused colonoscopy.  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  He has developed slow atrial fibrillation and has recently undergone EP evaluation by Dr. Curt Bears who is tentatively planning on permanent pacemaker implantation.  The patient also has carotid disease with a history of carotid endarterectomy and right subclavian stenosis which is been followed medically.  Over the last 6-12 months he has noticed progressive shortness of breath.  He denies chest pain or pressure, lightheadedness, or syncope.  He is short of breath with getting dressed.  He is short of breath with walking to feed his horses.  States he is not able to keep up with his wife at her walking pace over the last several months.  All of the symptoms are progressive over recent months.  Patient also has some limitation from right knee pain and bilateral upper arm pain that he relates to arthritis.   Past Medical History:  Diagnosis Date  . Carotid arterial disease ([place])   . Coronary artery disease   . Diabetes mellitus   . HOH (hard of hearing)   . Hyperlipidemia   . Hypertension   . Obesity   . Osteoarthritis    Left wrist    . Peripheral vision loss, bilateral   . Stroke ([place]) 07/2015  . Torn rotator cuff 2012    Right arm    Past Surgical History:  Procedure Laterality Date  . BACK SURGERY     X2  . CARDIAC CATHETERIZATION  11/29/2005   SEVERE THREE VESSEL OBSTRUCTIVE ATHERSCLEROTIC CAD. ALL GRAFTS WERE PATENT. EF 65%  . CAROTID ENDARTERECTOMY Left 05-28-12   cea  . CAROTID ENDARTERECTOMY Right 01-29-08   cea  . CORONARY ARTERY BYPASS GRAFT  03/2003   X3. LIMA GRAFT TO THE LAD, SAPHENOUS VEIN GRAFT TO THE PA, AND A LEFT RADIAL GRAFT TO THEOBTUSE MARGINAL VESSEL  . ENDARTERECTOMY Left 05/28/2012   Procedure: ENDARTERECTOMY CAROTID;  Surgeon: Elam Dutch, MD;  Location: [place];  Service: Vascular;  Laterality: Left;  . MOLE SURGERY    . PATCH ANGIOPLASTY Left 05/28/2012   Procedure: WITH dACRON PATCH ANGIOPLASTY;  Surgeon: Elam Dutch, MD;  [facility]: [facility] OR;  Service: Vascular;  Laterality: Left;    Current Outpatient Medications  Medication Sig Dispense Refill  . acetaminophen (TYLENOL) 500 MG tablet Take 650 mg by mouth every 6 (six) hours as needed for mild pain or moderate pain.     Marland Kitchen apixaban (ELIQUIS) 5 MG TABS tablet Take 1 tablet (5 mg total) by mouth 2 (two) times daily. 60 tablet 0  . benazepril (LOTENSIN) 40 MG tablet Take 1 tablet (40 mg total) by mouth daily. 90 tablet  3  . Cyanocobalamin (RA VITAMIN B-12 TR) 1000 MCG TBCR Take 1 tablet by mouth daily.     Marland Kitchen ezetimibe (ZETIA) 10 MG tablet Take 1 tablet (10 mg total) by mouth daily. 30 tablet 11  . fluticasone (FLONASE) 50 MCG/ACT nasal spray Place 2 sprays into both nostrils daily.    Marland Kitchen glimepiride (AMARYL) 2 MG tablet Take 2 mg by mouth daily with breakfast.    . hydrochlorothiazide (HYDRODIURIL) 25 MG tablet Take 25 mg by mouth every morning.     Marland Kitchen OVER THE COUNTER MEDICATION Replenex tablets: Take 3 tablet by mouth every morning (joint health)    . rosuvastatin (CRESTOR) 20 MG tablet Take 1 tablet (20 mg total) by mouth daily. 30  tablet 0  . tamsulosin (FLOMAX) 0.4 MG CAPS capsule TAKE 1 CAPSULE BY MOUTH ONCE DAILY 90 capsule 3   No current facility-administered medications for this visit.     Allergies:   Niacin and related; Metformin and related; and Statins   Social History:  The patient  reports that he quit smoking about 23 years ago. He has quit using smokeless tobacco. He reports that he does not drink alcohol or use drugs.   Family History:  The patient's family history includes Arrhythmia in his brother; Cancer in his brother; Heart attack in his brother and father; Parkinsonism in his brother.   ROS:  Please see the history of present illness.  Otherwise, review of systems is positive for arm pain, right knee pain, shortness of breath, hearing loss, depression, easy bruising, fatigue, anxiety, joint swelling, balance problems.  All other systems are reviewed and negative.   PHYSICAL EXAM: VS:  BP (!) 142/78   Pulse 60   Ht 6' (1.829 m)   Wt 273 lb 3.2 oz (123.9 kg)   BMI 37.05 kg/m  , BMI Body mass index is 37.05 kg/m. GEN: Well nourished, well developed, pleasant obese male in no acute distress  HEENT: normal  Neck: no JVD, no masses. BL carotid bruits Cardiac: irregular with 3/6 harsh late-peaking systolic murmur at the RUSB, no diastolic murmur, A2 is audible  Respiratory:  clear to auscultation bilaterally, normal work of breathing GI: soft, nontender, nondistended, + BS, obese MS: no deformity or atrophy  Ext: 1+ BL pretibial edema. The left radial is absent. Left ulnar pulse is 2+ Skin: warm and dry, no rash Neuro:  Strength and sensation are intact Psych: euthymic mood, full affect  EKG:  EKG is not ordered today.  Recent Labs: 02/07/2017: ALT 22; BUN 20; Creatinine, Ser 1.21; Hemoglobin 12.2; Platelets 225; Potassium 4.8; Sodium 142; TSH 0.855   Lipid Panel     Component Value Date/Time   CHOL 116 02/07/2017 1516   TRIG 81 02/07/2017 1516   HDL 48 02/07/2017 1516   CHOLHDL 2.3  12/11/2015 0854   VLDL 12 12/11/2015 0854   LDLCALC 52 02/07/2017 1516      Wt Readings from Last 3 Encounters:  03/06/17 273 lb 3.2 oz (123.9 kg)  02/21/17 268 lb (121.6 kg)  02/07/17 267 lb 12.8 oz (121.5 kg)     Cardiac Studies Reviewed: 2D Echo: Study Conclusions  - Left ventricle: The cavity size was normal. Wall thickness was   increased in a pattern of severe LVH. Systolic function was   normal. The estimated ejection fraction was in the range of 55%   to 60%. Indeterminant diastolic function (atrial fibrillation).   Wall motion was normal; there were no regional wall motion  abnormalities. - Aortic valve: Probably trileaflet; severely calcified leaflets.   There was severe stenosis. Mean gradient (S): 38 mm Hg. Valve   area (VTI): 0.64 cm^2. - Mitral valve: Moderately calcified annulus. There was trivial   regurgitation. - Left atrium: The atrium was severely dilated. - Right ventricle: The cavity size was normal. Systolic function   was mildly to moderately reduced. - Right atrium: The atrium was mildly dilated. - Pulmonary arteries: No complete TR doppler jet so unable to   estimate PA systolic pressure. - Systemic veins: IVC measured 2.3 cm with < 50% respirophasic   variation, suggesting RA pressure 15 mmHg.  Impressions:  - The patient was in atrial fibrillation. Normal LV size with   severe LV hypertrophy. EF 55-60%. Normal RV size with mild to   moderately decreased systolic function. Aortic stenosis appeared   severe. Mean gradient was not quite to severe range but visually   and by calculated AVA, there was severe aortic stenosis.  STS Risk Calculator: Procedure: Isolated AVR  Risk of Mortality: 3.269%  Renal Failure: 4.595%  Permanent Stroke: 2.221%  Prolonged Ventilation: 11.815%  DSW Infection: 0.274%  Reoperation: 3.125%  Morbidity or Mortality: 18.377%  Short Length of Stay: 28.968%  Long Length of Stay: 7.522%   ASSESSMENT AND  PLAN: 1. Severe, symptomatic aortic stenosis, Stage D1 2. Persistent atrial fibrillation with slow ventricular response 3. Chronic diastolic heart failure, NYHA 2-3 symptoms 4. CAD s/p CABG without angina 5. Obesity BMI 35-40  The patient's 2D echo is personally reviewed.  This demonstrates concentric LVH with low normal LV systolic function LVEF estimated at 55%.  The patient's aortic valve is severely calcified.  There is severely restricted leaflet mobility.  There appears to be mild aortic insufficiency.  The peak systolic velocity is 952 cm/s and the mean transaortic gradient is 38 mmHg.  The aortic valve dimensionless index is 0.2 and the stroke volume index is 25 indicating low stroke volume which can underestimate valve gradients. I suspect his progressive dyspnea is multifactorial and related to worsening aortic stenosis, atrial fibrillation, and obesity/deconditioning. His aortic stenosis has progressed and is now in the severe range, with a Class I indication for AVR. I have reviewed the natural history of aortic stenosis with the patient and his wife who is present today. We have discussed the limitations of medical therapy and the poor prognosis associated with symptomatic aortic stenosis. We have reviewed potential treatment options, including palliative medical therapy, conventional surgical aortic valve replacement, and transcatheter aortic valve replacement. We discussed treatment options in the context of the patient's specific comorbid medical conditions. Considering his STS PROM > 3% based on comorbid medical conditions of prior CABG, atrial fibrillation, and prior stroke, TAVR is a reasonable treatment option. He understands the need to undergo R/L heart catheterization as well as CTA studies prior to TAVR. In addition, with advanced conduction disease and plans for PPM placement, it would be appropriate to perform PPM placement prior to TAVR to avoid post-procedural complication and  urgent PPM placement post-operatively. Will try to coordinate his diagnostic R/L heart cath and PPM placement so that he only has to interrupt anticoagulation once in the context of atrial fibrillation and prior stroke.   I have reviewed the risks, indications, and alternatives to cardiac catheterization, possible angioplasty, and stenting with the patient. Risks include but are not limited to bleeding, infection, vascular injury, stroke, myocardial infection, arrhythmia, kidney injury, radiation-related injury in the case of prolonged fluoroscopy use, emergency cardiac  surgery, and death. The patient understands the risks of serious complication is 1-2 in 1216 with diagnostic cardiac cath and 1-2% or less with angioplasty/stenting. He's had left radial grafting and will need to undergo cath via a femoral approach with a patent LIMA graft.  Current medicines are reviewed with the patient today.  The patient does not have concerns regarding medicines.  Labs/ tests ordered today include:  No orders of the defined types were placed in this encounter.   Disposition:   FU pending test results  Signed, Sherren Mocha, MD  03/06/2017 8:49 AM    [facility] [place], [place], [place]  [address] Phone: [phone]; Fax: [phone]

## 2017-03-06 NOTE — Progress Notes (Signed)
Cardiology Office Note Date:  03/06/2017   ID:  Evan [name], DOB [birth year], MRN [id number]  PCP:  Leonel Ramsay, MD  Cardiologist:  [name] [name], MD Electrophysiologist: Dr Curt Bears  Chief Complaint  Patient presents with  . Shortness of Breath   History of Present Illness: Evan [name] is a 77 y.o. male who presents for evaluation of severe aortic stenosis.  The patient has a history of coronary artery disease and under multivessel CABG in 2005.  He has permanent atrial fibrillation.  He has a history of stroke in 2017 with hemorrhagic transformation but has tolerated apixaban.  He also has a history of GI bleeding with heme positive stool.  His hemoglobin was only mildly reduced at 11.7 and the patient had GI evaluation but refused colonoscopy.  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  He has developed slow atrial fibrillation and has recently undergone EP evaluation by Dr. Curt Bears who is tentatively planning on permanent pacemaker implantation.  The patient also has carotid disease with a history of carotid endarterectomy and right subclavian stenosis which is been followed medically.  Over the last 6-12 months he has noticed progressive shortness of breath.  He denies chest pain or pressure, lightheadedness, or syncope.  He is short of breath with getting dressed.  He is short of breath with walking to feed his horses.  States he is not able to keep up with his wife at her walking pace over the last several months.  All of the symptoms are progressive over recent months.  Patient also has some limitation from right knee pain and bilateral upper arm pain that he relates to arthritis.   Past Medical History:  Diagnosis Date  . Carotid arterial disease ([place])   . Coronary artery disease   . Diabetes mellitus   . HOH (hard of hearing)   . Hyperlipidemia   . Hypertension   . Obesity   . Osteoarthritis    Left wrist    . Peripheral vision loss, bilateral   . Stroke ([place]) 07/2015  . Torn rotator cuff 2012    Right arm    Past Surgical History:  Procedure Laterality Date  . BACK SURGERY     X2  . CARDIAC CATHETERIZATION  11/29/2005   SEVERE THREE VESSEL OBSTRUCTIVE ATHERSCLEROTIC CAD. ALL GRAFTS WERE PATENT. EF 65%  . CAROTID ENDARTERECTOMY Left 05-28-12   cea  . CAROTID ENDARTERECTOMY Right 01-29-08   cea  . CORONARY ARTERY BYPASS GRAFT  03/2003   X3. LIMA GRAFT TO THE LAD, SAPHENOUS VEIN GRAFT TO THE PA, AND A LEFT RADIAL GRAFT TO THEOBTUSE MARGINAL VESSEL  . ENDARTERECTOMY Left 05/28/2012   Procedure: ENDARTERECTOMY CAROTID;  Surgeon: Elam Dutch, MD;  Location: [facility];  Service: Vascular;  Laterality: Left;  . MOLE SURGERY    . PATCH ANGIOPLASTY Left 05/28/2012   Procedure: WITH dACRON PATCH ANGIOPLASTY;  Surgeon: Elam Dutch, MD;  Location: [facility];  Service: Vascular;  Laterality: Left;    Current Outpatient Medications  Medication Sig Dispense Refill  . acetaminophen (TYLENOL) 500 MG tablet Take 650 mg by mouth every 6 (six) hours as needed for mild pain or moderate pain.     Marland Kitchen apixaban (ELIQUIS) 5 MG TABS tablet Take 1 tablet (5 mg total) by mouth 2 (two) times daily. 60 tablet 0  . benazepril (LOTENSIN) 40 MG tablet Take 1 tablet (40 mg total) by mouth daily. 90 tablet  3  . Cyanocobalamin (RA VITAMIN B-12 TR) 1000 MCG TBCR Take 1 tablet by mouth daily.     Marland Kitchen ezetimibe (ZETIA) 10 MG tablet Take 1 tablet (10 mg total) by mouth daily. 30 tablet 11  . fluticasone (FLONASE) 50 MCG/ACT nasal spray Place 2 sprays into both nostrils daily.    Marland Kitchen glimepiride (AMARYL) 2 MG tablet Take 2 mg by mouth daily with breakfast.    . hydrochlorothiazide (HYDRODIURIL) 25 MG tablet Take 25 mg by mouth every morning.     Marland Kitchen OVER THE COUNTER MEDICATION Replenex tablets: Take 3 tablet by mouth every morning (joint health)    . rosuvastatin (CRESTOR) 20 MG tablet Take 1 tablet (20 mg total) by mouth daily. 30  tablet 0  . tamsulosin (FLOMAX) 0.4 MG CAPS capsule TAKE 1 CAPSULE BY MOUTH ONCE DAILY 90 capsule 3   No current facility-administered medications for this visit.     Allergies:   Niacin and related; Metformin and related; and Statins   Social History:  The patient  reports that he quit smoking about 23 years ago. He has quit using smokeless tobacco. He reports that he does not drink alcohol or use drugs.   Family History:  The patient's family history includes Arrhythmia in his brother; Cancer in his brother; Heart attack in his brother and father; Parkinsonism in his brother.   ROS:  Please see the history of present illness.  Otherwise, review of systems is positive for arm pain, right knee pain, shortness of breath, hearing loss, depression, easy bruising, fatigue, anxiety, joint swelling, balance problems.  All other systems are reviewed and negative.   PHYSICAL EXAM: VS:  BP (!) 142/78   Pulse 60   Ht 6' (1.829 m)   Wt 273 lb 3.2 oz (123.9 kg)   BMI 37.05 kg/m  , BMI Body mass index is 37.05 kg/m. GEN: Well nourished, well developed, pleasant obese male in no acute distress  HEENT: normal  Neck: no JVD, no masses. BL carotid bruits Cardiac: irregular with 3/6 harsh late-peaking systolic murmur at the RUSB, no diastolic murmur, A2 is audible  Respiratory:  clear to auscultation bilaterally, normal work of breathing GI: soft, nontender, nondistended, + BS, obese MS: no deformity or atrophy  Ext: 1+ BL pretibial edema. The left radial is absent. Left ulnar pulse is 2+ Skin: warm and dry, no rash Neuro:  Strength and sensation are intact Psych: euthymic mood, full affect  EKG:  EKG is not ordered today.  Recent Labs: 02/07/2017: ALT 22; BUN 20; Creatinine, Ser 1.21; Hemoglobin 12.2; Platelets 225; Potassium 4.8; Sodium 142; TSH 0.855   Lipid Panel     Component Value Date/Time   CHOL 116 02/07/2017 1516   TRIG 81 02/07/2017 1516   HDL 48 02/07/2017 1516   CHOLHDL 2.3  12/11/2015 0854   VLDL 12 12/11/2015 0854   LDLCALC 52 02/07/2017 1516      Wt Readings from Last 3 Encounters:  03/06/17 273 lb 3.2 oz (123.9 kg)  02/21/17 268 lb (121.6 kg)  02/07/17 267 lb 12.8 oz (121.5 kg)     Cardiac Studies Reviewed: 2D Echo: Study Conclusions  - Left ventricle: The cavity size was normal. Wall thickness was   increased in a pattern of severe LVH. Systolic function was   normal. The estimated ejection fraction was in the range of 55%   to 60%. Indeterminant diastolic function (atrial fibrillation).   Wall motion was normal; there were no regional wall motion  abnormalities. - Aortic valve: Probably trileaflet; severely calcified leaflets.   There was severe stenosis. Mean gradient (S): 38 mm Hg. Valve   area (VTI): 0.64 cm^2. - Mitral valve: Moderately calcified annulus. There was trivial   regurgitation. - Left atrium: The atrium was severely dilated. - Right ventricle: The cavity size was normal. Systolic function   was mildly to moderately reduced. - Right atrium: The atrium was mildly dilated. - Pulmonary arteries: No complete TR doppler jet so unable to   estimate PA systolic pressure. - Systemic veins: IVC measured 2.3 cm with < 50% respirophasic   variation, suggesting RA pressure 15 mmHg.  Impressions:  - The patient was in atrial fibrillation. Normal LV size with   severe LV hypertrophy. EF 55-60%. Normal RV size with mild to   moderately decreased systolic function. Aortic stenosis appeared   severe. Mean gradient was not quite to severe range but visually   and by calculated AVA, there was severe aortic stenosis.  STS Risk Calculator: Procedure: Isolated AVR  Risk of Mortality: 3.269%  Renal Failure: 4.595%  Permanent Stroke: 2.221%  Prolonged Ventilation: 11.815%  DSW Infection: 0.274%  Reoperation: 3.125%  Morbidity or Mortality: 18.377%  Short Length of Stay: 28.968%  Long Length of Stay: 7.522%   ASSESSMENT AND  PLAN: 1. Severe, symptomatic aortic stenosis, Stage D1 2. Persistent atrial fibrillation with slow ventricular response 3. Chronic diastolic heart failure, NYHA 2-3 symptoms 4. CAD s/p CABG without angina 5. Obesity BMI 35-40  The patient's 2D echo is personally reviewed.  This demonstrates concentric LVH with low normal LV systolic function LVEF estimated at 55%.  The patient's aortic valve is severely calcified.  There is severely restricted leaflet mobility.  There appears to be mild aortic insufficiency.  The peak systolic velocity is 329 cm/s and the mean transaortic gradient is 38 mmHg.  The aortic valve dimensionless index is 0.2 and the stroke volume index is 25 indicating low stroke volume which can underestimate valve gradients. I suspect his progressive dyspnea is multifactorial and related to worsening aortic stenosis, atrial fibrillation, and obesity/deconditioning. His aortic stenosis has progressed and is now in the severe range, with a Class I indication for AVR. I have reviewed the natural history of aortic stenosis with the patient and his wife who is present today. We have discussed the limitations of medical therapy and the poor prognosis associated with symptomatic aortic stenosis. We have reviewed potential treatment options, including palliative medical therapy, conventional surgical aortic valve replacement, and transcatheter aortic valve replacement. We discussed treatment options in the context of the patient's specific comorbid medical conditions. Considering his STS PROM > 3% based on comorbid medical conditions of prior CABG, atrial fibrillation, and prior stroke, TAVR is a reasonable treatment option. He understands the need to undergo R/L heart catheterization as well as CTA studies prior to TAVR. In addition, with advanced conduction disease and plans for PPM placement, it would be appropriate to perform PPM placement prior to TAVR to avoid post-procedural complication and  urgent PPM placement post-operatively. Will try to coordinate his diagnostic R/L heart cath and PPM placement so that he only has to interrupt anticoagulation once in the context of atrial fibrillation and prior stroke.   I have reviewed the risks, indications, and alternatives to cardiac catheterization, possible angioplasty, and stenting with the patient. Risks include but are not limited to bleeding, infection, vascular injury, stroke, myocardial infection, arrhythmia, kidney injury, radiation-related injury in the case of prolonged fluoroscopy use, emergency cardiac  surgery, and death. The patient understands the risks of serious complication is 1-2 in 3976 with diagnostic cardiac cath and 1-2% or less with angioplasty/stenting. He's had left radial grafting and will need to undergo cath via a femoral approach with a patent LIMA graft.  Current medicines are reviewed with the patient today.  The patient does not have concerns regarding medicines.  Labs/ tests ordered today include:  No orders of the defined types were placed in this encounter.   Disposition:   FU pending test results  Signed, Sherren Mocha, MD  03/06/2017 8:49 AM    [facility] [address], [place], [place]  [address] Phone: [phone]; Fax: [phone]

## 2017-03-07 ENCOUNTER — Other Ambulatory Visit: Payer: Self-pay

## 2017-03-07 DIAGNOSIS — I350 Nonrheumatic aortic (valve) stenosis: Secondary | ICD-10-CM

## 2017-03-07 DIAGNOSIS — R001 Bradycardia, unspecified: Secondary | ICD-10-CM

## 2017-03-13 ENCOUNTER — Other Ambulatory Visit: Payer: Self-pay

## 2017-03-13 ENCOUNTER — Telehealth: Payer: Self-pay | Admitting: Cardiovascular Disease

## 2017-03-13 DIAGNOSIS — I350 Nonrheumatic aortic (valve) stenosis: Secondary | ICD-10-CM

## 2017-03-13 NOTE — Telephone Encounter (Signed)
Records rec from [facility]. Faxed to [employer] [facility] [employer] @ [phone].

## 2017-03-13 NOTE — Progress Notes (Signed)
Pre TAVR orders placed.

## 2017-03-16 ENCOUNTER — Other Ambulatory Visit: Payer: Self-pay | Admitting: Cardiology

## 2017-03-16 DIAGNOSIS — I4821 Permanent atrial fibrillation: Secondary | ICD-10-CM

## 2017-03-20 ENCOUNTER — Other Ambulatory Visit: Payer: Medicare Other | Admitting: *Deleted

## 2017-03-20 DIAGNOSIS — R001 Bradycardia, unspecified: Secondary | ICD-10-CM

## 2017-03-20 DIAGNOSIS — I350 Nonrheumatic aortic (valve) stenosis: Secondary | ICD-10-CM

## 2017-03-20 LAB — CBC
Hematocrit: 35.5 % — ABNORMAL LOW (ref 37.5–51.0)
Hemoglobin: 12 g/dL — ABNORMAL LOW (ref 13.0–17.7)
MCH: 31.3 pg (ref 26.6–33.0)
MCHC: 33.8 g/dL (ref 31.5–35.7)
MCV: 93 fL (ref 79–97)
PLATELETS: 195 10*3/uL (ref 150–379)
RBC: 3.83 x10E6/uL — AB (ref 4.14–5.80)
RDW: 14.6 % (ref 12.3–15.4)
WBC: 5.2 10*3/uL (ref 3.4–10.8)

## 2017-03-20 LAB — BASIC METABOLIC PANEL
BUN / CREAT RATIO: 15 (ref 10–24)
BUN: 20 mg/dL (ref 8–27)
CHLORIDE: 100 mmol/L (ref 96–106)
CO2: 22 mmol/L (ref 20–29)
Calcium: 9.5 mg/dL (ref 8.6–10.2)
Creatinine, Ser: 1.32 mg/dL — ABNORMAL HIGH (ref 0.76–1.27)
GFR calc Af Amer: 61 mL/min/{1.73_m2} (ref >59)
GFR calc non Af Amer: 52 mL/min/{1.73_m2} — ABNORMAL LOW (ref >59)
GLUCOSE: 193 mg/dL — AB (ref 65–99)
POTASSIUM: 4.6 mmol/L (ref 3.5–5.2)
SODIUM: 137 mmol/L (ref 134–144)

## 2017-03-20 LAB — PROTIME-INR
INR: 1.1 (ref 0.8–1.2)
Prothrombin Time: 11.8 s (ref 9.1–12.0)

## 2017-03-21 ENCOUNTER — Telehealth: Payer: Self-pay | Admitting: *Deleted

## 2017-03-21 NOTE — Telephone Encounter (Signed)
Pt contacted pre-catheterization scheduled at [facility] for: Thursday February 29, 2019 7:30 AM Verified arrival time and place: [place] Entr[facility] A/[place] at: 5:30 AM  Nothing to eat or drink after midnight Wednesday night. Verified allergies listed in Epic  Hold: No Eliquis 03/21/17, 03/22/17, until after cath 03/23/17. No glimepiride AM 03/23/17 No HCTZ AM 03/23/17  Except hold medications AM meds can be  taken pre-cath with sip of water including: ASA 81 mg AM of cath  Confirmed patient has responsible person to drive home post procedure and observe patient for 24 hours: yes   NOTE: COPIED FROM DR COOPER'S 03/06/17 office note: He's had left radial grafting and will need to undergo cath via a femoral approach with a patent LIMA graft.

## 2017-03-23 ENCOUNTER — Encounter (HOSPITAL_COMMUNITY): Payer: Self-pay | Admitting: Nurse Practitioner

## 2017-03-23 ENCOUNTER — Encounter (HOSPITAL_COMMUNITY)
Admission: RE | Disposition: A | Discharge status: Home / Self Care | Payer: Self-pay | Source: Ambulatory Visit | Attending: Cardiology

## 2017-03-23 ENCOUNTER — Ambulatory Visit (HOSPITAL_COMMUNITY)
Admission: RE | Admit: 2017-03-23 | Discharge: 2017-03-24 | Disposition: A | Discharge status: Home / Self Care | Payer: Medicare Other | Source: Ambulatory Visit | Attending: Cardiology | Admitting: Cardiology

## 2017-03-23 ENCOUNTER — Other Ambulatory Visit: Payer: Self-pay

## 2017-03-23 DIAGNOSIS — Z7984 Long term (current) use of oral hypoglycemic drugs: Secondary | ICD-10-CM | POA: Diagnosis not present

## 2017-03-23 DIAGNOSIS — Z7951 Long term (current) use of inhaled steroids: Secondary | ICD-10-CM | POA: Insufficient documentation

## 2017-03-23 DIAGNOSIS — I110 Hypertensive heart disease with heart failure: Secondary | ICD-10-CM | POA: Diagnosis not present

## 2017-03-23 DIAGNOSIS — I453 Trifascicular block: Secondary | ICD-10-CM | POA: Diagnosis not present

## 2017-03-23 DIAGNOSIS — I4821 Permanent atrial fibrillation: Secondary | ICD-10-CM | POA: Diagnosis present

## 2017-03-23 DIAGNOSIS — Z950 Presence of cardiac pacemaker: Secondary | ICD-10-CM

## 2017-03-23 DIAGNOSIS — M1990 Unspecified osteoarthritis, unspecified site: Secondary | ICD-10-CM | POA: Insufficient documentation

## 2017-03-23 DIAGNOSIS — E119 Type 2 diabetes mellitus without complications: Secondary | ICD-10-CM | POA: Diagnosis not present

## 2017-03-23 DIAGNOSIS — I5032 Chronic diastolic (congestive) heart failure: Secondary | ICD-10-CM | POA: Diagnosis not present

## 2017-03-23 DIAGNOSIS — I2584 Coronary atherosclerosis due to calcified coronary lesion: Secondary | ICD-10-CM | POA: Insufficient documentation

## 2017-03-23 DIAGNOSIS — E669 Obesity, unspecified: Secondary | ICD-10-CM | POA: Diagnosis not present

## 2017-03-23 DIAGNOSIS — I350 Nonrheumatic aortic (valve) stenosis: Secondary | ICD-10-CM | POA: Diagnosis not present

## 2017-03-23 DIAGNOSIS — I482 Chronic atrial fibrillation: Secondary | ICD-10-CM | POA: Insufficient documentation

## 2017-03-23 DIAGNOSIS — Z8249 Family history of ischemic heart disease and other diseases of the circulatory system: Secondary | ICD-10-CM | POA: Insufficient documentation

## 2017-03-23 DIAGNOSIS — Z87891 Personal history of nicotine dependence: Secondary | ICD-10-CM | POA: Insufficient documentation

## 2017-03-23 DIAGNOSIS — I272 Pulmonary hypertension, unspecified: Secondary | ICD-10-CM | POA: Diagnosis not present

## 2017-03-23 DIAGNOSIS — Z6837 Body mass index (BMI) 37.0-37.9, adult: Secondary | ICD-10-CM | POA: Diagnosis not present

## 2017-03-23 DIAGNOSIS — Z951 Presence of aortocoronary bypass graft: Secondary | ICD-10-CM | POA: Diagnosis not present

## 2017-03-23 DIAGNOSIS — Z8673 Personal history of transient ischemic attack (TIA), and cerebral infarction without residual deficits: Secondary | ICD-10-CM | POA: Insufficient documentation

## 2017-03-23 DIAGNOSIS — Z7901 Long term (current) use of anticoagulants: Secondary | ICD-10-CM | POA: Insufficient documentation

## 2017-03-23 DIAGNOSIS — I4891 Unspecified atrial fibrillation: Secondary | ICD-10-CM | POA: Diagnosis not present

## 2017-03-23 DIAGNOSIS — I2582 Chronic total occlusion of coronary artery: Secondary | ICD-10-CM | POA: Insufficient documentation

## 2017-03-23 DIAGNOSIS — I2510 Atherosclerotic heart disease of native coronary artery without angina pectoris: Secondary | ICD-10-CM | POA: Insufficient documentation

## 2017-03-23 DIAGNOSIS — E785 Hyperlipidemia, unspecified: Secondary | ICD-10-CM | POA: Insufficient documentation

## 2017-03-23 DIAGNOSIS — Z95818 Presence of other cardiac implants and grafts: Secondary | ICD-10-CM

## 2017-03-23 HISTORY — PX: PACEMAKER IMPLANT: EP1218

## 2017-03-23 HISTORY — DX: Nocturia: R35.1

## 2017-03-23 HISTORY — DX: Anemia, unspecified: D64.9

## 2017-03-23 HISTORY — DX: Presence of cardiac pacemaker: Z95.0

## 2017-03-23 HISTORY — DX: Heart failure, unspecified: I50.9

## 2017-03-23 HISTORY — DX: Type 2 diabetes mellitus without complications: E11.9

## 2017-03-23 HISTORY — DX: Frequency of micturition: R35.0

## 2017-03-23 HISTORY — DX: Nonrheumatic aortic (valve) stenosis: I35.0

## 2017-03-23 HISTORY — DX: Permanent atrial fibrillation: I48.21

## 2017-03-23 HISTORY — PX: CARDIAC CATHETERIZATION: SHX172

## 2017-03-23 HISTORY — PX: INSERT / REPLACE / REMOVE PACEMAKER: SUR710

## 2017-03-23 HISTORY — DX: Other specified disorders of nose and nasal sinuses: J34.89

## 2017-03-23 HISTORY — PX: RIGHT/LEFT HEART CATH AND CORONARY/GRAFT ANGIOGRAPHY: CATH118267

## 2017-03-23 LAB — POCT I-STAT 3, VENOUS BLOOD GAS (G3P V)
ACID-BASE DEFICIT: 1 mmol/L (ref 0.0–2.0)
Bicarbonate: 24.7 mmol/L (ref 20.0–28.0)
O2 SAT: 69 %
TCO2: 26 mmol/L (ref 22–32)
pCO2, Ven: 46.7 mmHg (ref 44.0–60.0)
pH, Ven: 7.332 (ref 7.250–7.430)
pO2, Ven: 39 mmHg (ref 32.0–45.0)

## 2017-03-23 LAB — GLUCOSE, CAPILLARY
GLUCOSE-CAPILLARY: 145 mg/dL — AB (ref 65–99)
GLUCOSE-CAPILLARY: 147 mg/dL — AB (ref 65–99)
GLUCOSE-CAPILLARY: 191 mg/dL — AB (ref 65–99)
GLUCOSE-CAPILLARY: 147 mg/dL — AB (ref 65–99)

## 2017-03-23 LAB — POCT I-STAT 3, ART BLOOD GAS (G3+)
Acid-base deficit: 2 mmol/L (ref 0.0–2.0)
Bicarbonate: 23.7 mmol/L (ref 20.0–28.0)
O2 Saturation: 92 %
PH ART: 7.33 — AB (ref 7.350–7.450)
TCO2: 25 mmol/L (ref 22–32)
pCO2 arterial: 45 mmHg (ref 32.0–48.0)
pO2, Arterial: 67 mmHg — ABNORMAL LOW (ref 83.0–108.0)

## 2017-03-23 LAB — SURGICAL PCR SCREEN
MRSA, PCR: NEGATIVE
Staphylococcus aureus: NEGATIVE

## 2017-03-23 SURGERY — PACEMAKER IMPLANT
Anesthesia: LOCAL

## 2017-03-23 SURGERY — RIGHT/LEFT HEART CATH AND CORONARY/GRAFT ANGIOGRAPHY
Anesthesia: LOCAL

## 2017-03-23 MED ORDER — HEPARIN (PORCINE) IN NACL 2-0.9 UNIT/ML-% IJ SOLN
INTRAMUSCULAR | Status: AC
  Filled 2017-03-23: qty 500

## 2017-03-23 MED ORDER — SODIUM CHLORIDE 0.9 % IV SOLN: 250.0000 mL | INTRAVENOUS | Status: DC | PRN

## 2017-03-23 MED ORDER — HYDRALAZINE HCL 20 MG/ML IJ SOLN
INTRAMUSCULAR | Status: DC | PRN
  Administered 2017-03-23: 10 mg via INTRAVENOUS

## 2017-03-23 MED ORDER — FENTANYL CITRATE (PF) 100 MCG/2ML IJ SOLN
INTRAMUSCULAR | Status: DC | PRN
  Administered 2017-03-23: 25 ug via INTRAVENOUS

## 2017-03-23 MED ORDER — HEPARIN (PORCINE) IN NACL 2-0.9 UNIT/ML-% IJ SOLN
INTRAMUSCULAR | Status: AC
  Filled 2017-03-23: qty 1000

## 2017-03-23 MED ORDER — SODIUM CHLORIDE 0.9% FLUSH: 3.0000 mL | INTRAVENOUS | Status: DC | PRN

## 2017-03-23 MED ORDER — IOPAMIDOL (ISOVUE-370) INJECTION 76%
INTRAVENOUS | Status: DC | PRN
  Administered 2017-03-23: 110 mL via INTRA_ARTERIAL

## 2017-03-23 MED ORDER — IOPAMIDOL (ISOVUE-370) INJECTION 76%
INTRAVENOUS | Status: AC
  Filled 2017-03-23: qty 100

## 2017-03-23 MED ORDER — ONDANSETRON HCL 4 MG/2ML IJ SOLN: 4.0000 mg | Freq: Four times a day (QID) | INTRAMUSCULAR | Status: DC | PRN

## 2017-03-23 MED ORDER — FLUTICASONE PROPIONATE 50 MCG/ACT NA SUSP
2.0000 | Freq: Every day | NASAL | Status: DC
  Administered 2017-03-23 – 2017-03-24 (×2): 2 via NASAL
  Filled 2017-03-23: qty 16

## 2017-03-23 MED ORDER — HEPARIN (PORCINE) IN NACL 2-0.9 UNIT/ML-% IJ SOLN
INTRAMUSCULAR | Status: AC | PRN
  Administered 2017-03-23: 500 mL

## 2017-03-23 MED ORDER — ROSUVASTATIN CALCIUM 20 MG PO TABS
20.0000 mg | ORAL_TABLET | Freq: Every evening | ORAL | Status: DC
  Administered 2017-03-23: 20 mg via ORAL
  Filled 2017-03-23: qty 1

## 2017-03-23 MED ORDER — LIDOCAINE HCL (PF) 1 % IJ SOLN
INTRAMUSCULAR | Status: DC | PRN
  Administered 2017-03-23: 25 mL

## 2017-03-23 MED ORDER — HYDROCHLOROTHIAZIDE 25 MG PO TABS
25.0000 mg | ORAL_TABLET | Freq: Every morning | ORAL | Status: DC
  Administered 2017-03-23 – 2017-03-24 (×2): 25 mg via ORAL
  Filled 2017-03-23 (×2): qty 1

## 2017-03-23 MED ORDER — VITAMIN B-12 1000 MCG PO TABS
1000.0000 ug | ORAL_TABLET | Freq: Every day | ORAL | Status: DC
  Administered 2017-03-23 – 2017-03-24 (×2): 1000 ug via ORAL
  Filled 2017-03-23 (×2): qty 1

## 2017-03-23 MED ORDER — FENTANYL CITRATE (PF) 100 MCG/2ML IJ SOLN
INTRAMUSCULAR | Status: AC
  Filled 2017-03-23: qty 2

## 2017-03-23 MED ORDER — SODIUM CHLORIDE 0.9% FLUSH: 3.0000 mL | Freq: Two times a day (BID) | INTRAVENOUS | Status: DC

## 2017-03-23 MED ORDER — SODIUM CHLORIDE 0.9 % IV SOLN: INTRAVENOUS | Status: DC

## 2017-03-23 MED ORDER — ACETAMINOPHEN 325 MG PO TABS
650.0000 mg | ORAL_TABLET | ORAL | Status: DC | PRN
  Administered 2017-03-23 – 2017-03-24 (×3): 650 mg via ORAL
  Filled 2017-03-23 (×3): qty 2

## 2017-03-23 MED ORDER — GLIMEPIRIDE 1 MG PO TABS
2.0000 mg | ORAL_TABLET | Freq: Every day | ORAL | Status: DC
  Administered 2017-03-24: 2 mg via ORAL
  Filled 2017-03-23: qty 2

## 2017-03-23 MED ORDER — HEPARIN (PORCINE) IN NACL 2-0.9 UNIT/ML-% IJ SOLN
INTRAMUSCULAR | Status: AC | PRN
  Administered 2017-03-23: 500 mL
  Administered 2017-03-23: 500 mL via INTRA_ARTERIAL

## 2017-03-23 MED ORDER — MIDAZOLAM HCL 5 MG/5ML IJ SOLN
INTRAMUSCULAR | Status: AC
  Filled 2017-03-23: qty 5

## 2017-03-23 MED ORDER — SODIUM CHLORIDE 0.9 % IR SOLN: 80.0000 mg | Status: DC

## 2017-03-23 MED ORDER — MIDAZOLAM HCL 5 MG/5ML IJ SOLN
INTRAMUSCULAR | Status: DC | PRN
  Administered 2017-03-23 (×2): 1 mg via INTRAVENOUS

## 2017-03-23 MED ORDER — IOPAMIDOL (ISOVUE-370) INJECTION 76%
INTRAVENOUS | Status: AC
  Filled 2017-03-23: qty 150

## 2017-03-23 MED ORDER — TAMSULOSIN HCL 0.4 MG PO CAPS
0.4000 mg | ORAL_CAPSULE | Freq: Every day | ORAL | Status: DC
  Administered 2017-03-23 – 2017-03-24 (×2): 0.4 mg via ORAL
  Filled 2017-03-23 (×2): qty 1

## 2017-03-23 MED ORDER — FENTANYL CITRATE (PF) 100 MCG/2ML IJ SOLN
INTRAMUSCULAR | Status: DC | PRN
  Administered 2017-03-23 (×2): 25 ug via INTRAVENOUS

## 2017-03-23 MED ORDER — LIDOCAINE HCL (PF) 1 % IJ SOLN
INTRAMUSCULAR | Status: AC
  Filled 2017-03-23: qty 60

## 2017-03-23 MED ORDER — ASPIRIN 81 MG PO CHEW: 81.0000 mg | CHEWABLE_TABLET | ORAL | Status: DC

## 2017-03-23 MED ORDER — AMLODIPINE BESYLATE 2.5 MG PO TABS
2.5000 mg | ORAL_TABLET | Freq: Every day | ORAL | Status: DC
  Administered 2017-03-24: 2.5 mg via ORAL
  Filled 2017-03-23 (×2): qty 1

## 2017-03-23 MED ORDER — APIXABAN 5 MG PO TABS
5.0000 mg | ORAL_TABLET | Freq: Two times a day (BID) | ORAL | Status: DC
  Administered 2017-03-23 – 2017-03-24 (×3): 5 mg via ORAL
  Filled 2017-03-23 (×3): qty 1

## 2017-03-23 MED ORDER — SODIUM CHLORIDE 0.9% FLUSH
3.0000 mL | Freq: Two times a day (BID) | INTRAVENOUS | Status: DC
  Administered 2017-03-23 (×2): 3 mL via INTRAVENOUS

## 2017-03-23 MED ORDER — MIDAZOLAM HCL 2 MG/2ML IJ SOLN
INTRAMUSCULAR | Status: DC | PRN
  Administered 2017-03-23: 2 mg via INTRAVENOUS

## 2017-03-23 MED ORDER — MIDAZOLAM HCL 2 MG/2ML IJ SOLN
INTRAMUSCULAR | Status: AC
  Filled 2017-03-23: qty 2

## 2017-03-23 MED ORDER — SODIUM CHLORIDE 0.9 % WEIGHT BASED INFUSION: 1.0000 mL/kg/h | INTRAVENOUS | Status: DC

## 2017-03-23 MED ORDER — SODIUM CHLORIDE 0.9 % IR SOLN
Status: AC
  Filled 2017-03-23: qty 2

## 2017-03-23 MED ORDER — ACETAMINOPHEN 325 MG PO TABS: 325.0000 mg | ORAL_TABLET | ORAL | Status: DC | PRN

## 2017-03-23 MED ORDER — CEFAZOLIN SODIUM-DEXTROSE 1-4 GM/50ML-% IV SOLN
1.0000 g | Freq: Four times a day (QID) | INTRAVENOUS | Status: AC
  Administered 2017-03-23 – 2017-03-24 (×3): 1 g via INTRAVENOUS
  Filled 2017-03-23 (×3): qty 50

## 2017-03-23 MED ORDER — LIDOCAINE HCL (PF) 1 % IJ SOLN
INTRAMUSCULAR | Status: DC | PRN
  Administered 2017-03-23: 45 mL via SUBCUTANEOUS

## 2017-03-23 MED ORDER — SODIUM CHLORIDE 0.9 % WEIGHT BASED INFUSION
3.0000 mL/kg/h | INTRAVENOUS | Status: DC
  Administered 2017-03-23: 3 mL/kg/h via INTRAVENOUS

## 2017-03-23 MED ORDER — DEXTROSE 5 % IV SOLN
3.0000 g | INTRAVENOUS | Status: AC
  Administered 2017-03-23: 3 g via INTRAVENOUS
  Filled 2017-03-23: qty 3

## 2017-03-23 MED ORDER — BENAZEPRIL HCL 10 MG PO TABS
40.0000 mg | ORAL_TABLET | Freq: Every day | ORAL | Status: DC
  Administered 2017-03-24: 40 mg via ORAL
  Filled 2017-03-23 (×2): qty 4

## 2017-03-23 MED ORDER — MUPIROCIN 2 % EX OINT
TOPICAL_OINTMENT | CUTANEOUS | Status: AC
  Administered 2017-03-23: 1
  Filled 2017-03-23: qty 22

## 2017-03-23 SURGICAL SUPPLY — 7 items
CABLE SURGICAL S-101-97-12 (CABLE) ×2
HOVERMATT SINGLE USE (MISCELLANEOUS) ×2
LEAD TENDRIL MRI 58CM LPA1200M (Lead) ×2 IMPLANT
PACEMAKER ASSURITY SR-SF (Pacemaker) ×2 IMPLANT
PAD DEFIB LIFELINK (PAD) ×2
SHEATH CLASSIC 8F (SHEATH) ×2
TRAY PACEMAKER INSERTION (PACKS) ×2

## 2017-03-23 SURGICAL SUPPLY — 18 items
CATH EXPO 5F MPA-1 (CATHETERS) ×2 IMPLANT
CATH INFINITI 5 FR AL2 (CATHETERS) ×2 IMPLANT
CATH INFINITI 5 FR IM (CATHETERS) ×2 IMPLANT
CATH INFINITI 5FR AL1 (CATHETERS) ×2 IMPLANT
CATH INFINITI 5FR JL5 (CATHETERS) ×2 IMPLANT
CATH INFINITI 5FR MULTPACK ANG (CATHETERS) ×2 IMPLANT
CATH SWAN GANZ 7F STRAIGHT (CATHETERS) ×2 IMPLANT
COVER PRB 48X5XTLSCP FOLD TPE (BAG) ×1 IMPLANT
COVER PROBE 5X48 (BAG) ×1
KIT HEART LEFT (KITS) ×2 IMPLANT
PACK CARDIAC CATHETERIZATION (CUSTOM PROCEDURE TRAY) ×2 IMPLANT
PINNACLE LONG 5F 25CM (SHEATH) ×2
SHEATH INTROD PINNACLE 5F 25CM (SHEATH) ×1 IMPLANT
SHEATH PINNACLE 5F 10CM (SHEATH) ×2 IMPLANT
SHEATH PINNACLE 7F 10CM (SHEATH) ×2 IMPLANT
TRANSDUCER W/STOPCOCK (MISCELLANEOUS) ×2 IMPLANT
WIRE EMERALD 3MM-J .035X150CM (WIRE) ×2 IMPLANT
WIRE EMERALD ST .035X260CM (WIRE) ×2 IMPLANT

## 2017-03-23 NOTE — H&P (Signed)
Evan [name] has presented today for surgery, with the diagnosis of atrial fibrillation with slow rates.  The various methods of treatment have been discussed with the patient and family. After consideration of risks, benefits and other options for treatment, the patient has consented to  Procedure(s): pacemaker as a surgical intervention .  Risks include but not limited to bleeding, tamponade, infection, pneumothorax, among others. The patient's history has been reviewed, patient examined, no change in status, stable for surgery.  I have reviewed the patient's chart and labs.  Questions were answered to the patient's satisfaction.    Corrin Hingle Curt Bears, MD 03/23/2017 10:59 AM

## 2017-03-23 NOTE — Progress Notes (Incomplete)
Site area: Right groin a 5 french 23cm arterial and a 7 french venous sheath was removed  Site Prior to Removal:  Level 0  Pressure Applied For 30 MINUTES    Bedrest Beginning at 920am  Manual:   Yes.    Patient Status During Pull:  stable  Post Pull Groin Site:  Level 0  Post Pull Instructions Given:  Yes.    Post Pull Pulses Present:  Yes.    Dressing Applied:  Yes.    Comments:  VS remain stable during sheath pull

## 2017-03-23 NOTE — Discharge Summary (Addendum)
ELECTROPHYSIOLOGY PROCEDURE DISCHARGE SUMMARY    Patient ID: Evan [name],  MRN: [id number], DOB/AGE: [birth year] 77 y.o.  Admit date: 03/23/2017 Discharge date: 03/24/2017  Primary Care Physician: Leonel Ramsay, MD Primary Cardiologist: [name] Structural Heart: Burt Knack Electrophysiologist: Curt Bears  Primary Discharge Diagnosis:  Symptomatic bradycardia status post pacemaker implantation this admission Severe AS s/p L/RHC this admission  Secondary Discharge Diagnosis:  1.  Carotid artery disease 2.  CAD 3.  Diabetes 4.  Permanent atrial fibrillation 5.  Prior CVA 6.  HTN  Allergies  Allergen Reactions  . Niacin And Related Rash and Other (See Comments)    Red face and burns.  . Metformin And Related Diarrhea  . Statins Other (See Comments)    Myalgias, weakness     Procedures This Admission:  1.  Cardiac catheterization on 03/23/17 by Dr Burt Knack demonstrated severe native three-vessel coronary artery disease with total occlusion of the RCA, severe left main stenosis, total occlusion of the LAD with severe calcification, and severe stenosis of the circumflex and its branch vessels; status post aortocoronary bypass surgery with continued patency of the LIMA to LAD, continued patency of the free radial graft to OM with tight stenosis at the distal anastomosis involving a small caliber vessel, and continued patency of the saphenous vein graft to PDA without any significant disease; severe aortic valve stenosis with a mean gradient of 40 mmHg.  The calculated valve area is 1.74 cm likely related to high cardiac output; moderate pulmonary hypertension with a PA pressure of 72/25 with a mean of 38  2.  Implantation of a STJ single chamber PPM on 03/23/17 by Dr Curt Bears.  The patient received a STJ model number Assurity PPM with model number [id number] right ventricular lead. There were no immediate post procedure complications. 3.  CXR on 2/29/19 demonstrated no pneumothorax status  post device implantation.   Brief HPI: Evan [name] is a 77 y.o. male was referred to electrophysiology in the outpatient setting for consideration of PPM implantation.  Past medical history includes symptomatic bradycardia, permanent AF, and severe aortic stenosis.  The patient has had symptomatic bradycardia without reversible causes identified.  Risks, benefits, and alternatives to PPM implantation were reviewed with the patient who wished to proceed.   Hospital Course:  The patient was admitted and underwent diagnostic catheterization followed by implantation of a STJ single chamber PPM with details as outlined above.  He  was monitored on telemetry overnight which demonstrated AF with V pacing.  Left chest was without hematoma or ecchymosis.  The device was interrogated and found to be functioning normally.  CXR was obtained and demonstrated no pneumothorax status post device implantation.  Wound care, arm mobility, and restrictions were reviewed with the patient.  The patient was examined and considered stable for discharge to home.    Physical Exam: Vitals:   03/23/17 1317 03/23/17 1334 03/23/17 2031 03/24/17 0500  BP:  (!) 139/59 135/80 (!) 153/66  Pulse: (!) 0  (!) 55 (!) 58  Resp: (!) 0  20 18  Temp:  98.1 F (36.7 C) 98.8 F (37.1 C) (!) 97.5 F (36.4 C)  TempSrc:  Oral Oral Oral  SpO2: (!) 0% 96% 94% 95%  Weight:  261 lb 8 oz (118.6 kg)  258 lb 6.4 oz (117.2 kg)  Height:  6' (1.829 m)      GEN- The patient is well appearing, alert and oriented x 3 today.   HEENT: normocephalic, atraumatic; sclera clear, conjunctiva  pink; hearing intact; oropharynx clear; neck supple  Lungs- Clear to ausculation bilaterally, normal work of breathing.  No wheezes, rales, rhonchi Heart- Regular rate and rhythm (paced) GI- soft, non-tender, non-distended, bowel sounds present  Extremities- no clubbing, cyanosis, or edema  MS- no significant deformity or atrophy Skin- warm and dry, no rash or  lesion, left chest without hematoma/ecchymosis Psych- euthymic mood, full affect Neuro- strength and sensation are intact   Labs:   Lab Results  Component Value Date   WBC 5.2 03/20/2017   HGB 12.0 (L) 03/20/2017   HCT 35.5 (L) 03/20/2017   MCV 93 03/20/2017   PLT 195 03/20/2017    Recent Labs  Lab 03/20/17 0929  NA 137  K 4.6  CL 100  CO2 22  BUN 20  CREATININE 1.32*  CALCIUM 9.5  GLUCOSE 193*    Discharge Medications:  Allergies as of 03/24/2017      Reactions   Niacin And Related Rash, Other (See Comments)   Red face and burns.   Metformin And Related Diarrhea   Statins Other (See Comments)   Myalgias, weakness      Medication List    TAKE these medications   acetaminophen 650 MG CR tablet Commonly known as:  TYLENOL Take 1,300 mg by mouth every 8 (eight) hours as needed for pain.   amLODipine 2.5 MG tablet Commonly known as:  NORVASC Take 2.5 mg by mouth daily.   apixaban 5 MG Tabs tablet Commonly known as:  ELIQUIS Take 1 tablet (5 mg total) by mouth 2 (two) times daily.   benazepril 40 MG tablet Commonly known as:  LOTENSIN Take 1 tablet (40 mg total) by mouth daily.   ezetimibe 10 MG tablet Commonly known as:  ZETIA Take 1 tablet (10 mg total) by mouth daily. What changed:  when to take this   fluticasone 50 MCG/ACT nasal spray Commonly known as:  FLONASE Place 2 sprays into both nostrils daily.   glimepiride 2 MG tablet Commonly known as:  AMARYL Take 2 mg by mouth daily with breakfast.   hydrochlorothiazide 25 MG tablet Commonly known as:  HYDRODIURIL Take 1 tablet (25 mg total) by mouth every morning.   OVER THE COUNTER MEDICATION Replenex tablets: Take 3 tablet by mouth every morning (joint health)   RA VITAMIN B-12 TR 1000 MCG Tbcr Generic drug:  Cyanocobalamin Take 1,000 mcg by mouth daily.   REFRESH OP Place 2 drops into both eyes daily as needed (for dry eyes).   rosuvastatin 20 MG tablet Commonly known as:   CRESTOR Take 1 tablet (20 mg total) by mouth daily. What changed:  when to take this   tamsulosin 0.4 MG Caps capsule Commonly known as:  FLOMAX TAKE 1 CAPSULE BY MOUTH ONCE DAILY What changed:    how much to take  how to take this  when to take this       Disposition:  Discharge Instructions    Diet - low sodium heart healthy   Complete by:  As directed    Increase activity slowly   Complete by:  As directed      Follow-up Information    [facility] Follow up on 04/03/2017.   Specialty:  Cardiology Why:  at [facility] information: [address], [place]       Constance Haw, MD Follow up on 06/20/2017.   Specialty:  Cardiology Why:  at 2:30PM  Contact  information: [address] [place] [address] [phone]           Duration of Discharge Encounter: Greater than 30 minutes including physician time.  Signed, Chanetta Marshall, NP 03/24/2017 7:00 AM  I have seen and examined this patient with Chanetta Marshall.  Agree with above, note added to reflect my findings.  On exam, iRRR, no murmurs, lungs clear.  Patient admitted to the hospital for pacemaker implant for atrial fibrillation with slow rates.  Also had a left heart catheterization yesterday which showed stable but severe coronary disease.  We Zayd Bonet plan for further workup for aortic valve replacement.  Plan for discharge today with follow-up in device clinic.  Leidy Massar M. Kylynn Street MD 03/24/2017 9:40 AM

## 2017-03-23 NOTE — Discharge Instructions (Addendum)
Supplemental Discharge Instructions for  Pacemaker/Defibrillator Patients  Activity No heavy lifting or vigorous activity with your left/right arm for 6 to 8 weeks.  Do not raise your left/right arm above your head for one week.  Gradually raise your affected arm as drawn below.           __          03/26/17                      03/27/17                    03/28/17                    03/29/17  NO DRIVING    WOUND CARE - Keep the wound area clean and dry.  Do not get this area wet for one week. No showers for one week; you may shower on  03/29/17   . - The tape/steri-strips on your wound will fall off; do not pull them off.  No bandage is needed on the site.  DO  NOT apply any creams, oils, or ointments to the wound area. - If you notice any drainage or discharge from the wound, any swelling or bruising at the site, or you develop a fever > 101? F after you are discharged home, call the office at once.  Special Instructions - You are still able to use cellular telephones; use the ear opposite the side where you have your pacemaker/defibrillator.  Avoid carrying your cellular phone near your device. - When traveling through airports, show security personnel your identification card to avoid being screened in the metal detectors.  Ask the security personnel to use the hand wand. - Avoid arc welding equipment, MRI testing (magnetic resonance imaging), TENS units (transcutaneous nerve stimulators).  Call the office for questions about other devices. - Avoid electrical appliances that are in poor condition or are not properly grounded. - Microwave ovens are safe to be near or to operate.    ========================================================================================  Information on my medicine - ELIQUIS (apixaban)  Why was Eliquis prescribed for you? Eliquis was prescribed for you to reduce the risk of a blood clot forming that can cause a stroke if you have a medical condition  called atrial fibrillation (a type of irregular heartbeat).  What do You need to know about Eliquis ? Take your Eliquis TWICE DAILY - one tablet in the morning and one tablet in the evening with or without food. If you have difficulty swallowing the tablet whole please discuss with your pharmacist how to take the medication safely.  Take Eliquis exactly as prescribed by your doctor and DO NOT stop taking Eliquis without talking to the doctor who prescribed the medication.  Stopping may increase your risk of developing a stroke.  Refill your prescription before you run out.  After discharge, you should have regular check-up appointments with your healthcare provider that is prescribing your Eliquis.  In the future your dose may need to be changed if your kidney function or weight changes by a significant amount or as you get older.  What do you do if you miss a dose? If you miss a dose, take it as soon as you remember on the same day and resume taking twice daily.  Do not take more than one dose of ELIQUIS at the same time to make up a missed dose.  Important Safety Information A possible  side effect of Eliquis is bleeding. You should call your healthcare provider right away if you experience any of the following: ? Bleeding from an injury or your nose that does not stop. ? Unusual colored urine (red or dark brown) or unusual colored stools (red or black). ? Unusual bruising for unknown reasons. ? A serious fall or if you hit your head (even if there is no bleeding).  Some medicines may interact with Eliquis and might increase your risk of bleeding or clotting while on Eliquis. To help avoid this, consult your healthcare provider or pharmacist prior to using any new prescription or non-prescription medications, including herbals, vitamins, non-steroidal anti-inflammatory drugs (NSAIDs) and supplements.  This website has more information on Eliquis (apixaban):  http://www.eliquis.com/eliquis/home

## 2017-03-23 NOTE — Interval H&P Note (Signed)
History and Physical Interval Note:  03/23/2017 7:12 AM  Evan [name]  has presented today for surgery, with the diagnosis of Aortic Stenosis  The various methods of treatment have been discussed with the patient and family. After consideration of risks, benefits and other options for treatment, the patient has consented to  Procedure(s): RIGHT/LEFT HEART CATH AND CORONARY/GRAFT ANGIOGRAPHY (N/A) as a surgical intervention .  The patient's history has been reviewed, patient examined, no change in status, stable for surgery.  I have reviewed the patient's chart and labs.  Questions were answered to the patient's satisfaction.     Sherren Mocha

## 2017-03-24 ENCOUNTER — Ambulatory Visit (HOSPITAL_COMMUNITY): Payer: Medicare Other

## 2017-03-24 ENCOUNTER — Encounter (HOSPITAL_COMMUNITY): Payer: Self-pay | Admitting: Cardiology

## 2017-03-24 DIAGNOSIS — I482 Chronic atrial fibrillation: Secondary | ICD-10-CM | POA: Diagnosis not present

## 2017-03-24 DIAGNOSIS — I350 Nonrheumatic aortic (valve) stenosis: Secondary | ICD-10-CM | POA: Diagnosis not present

## 2017-03-24 DIAGNOSIS — I2510 Atherosclerotic heart disease of native coronary artery without angina pectoris: Secondary | ICD-10-CM | POA: Diagnosis not present

## 2017-03-24 DIAGNOSIS — I453 Trifascicular block: Secondary | ICD-10-CM | POA: Diagnosis not present

## 2017-03-24 DIAGNOSIS — I2584 Coronary atherosclerosis due to calcified coronary lesion: Secondary | ICD-10-CM | POA: Diagnosis not present

## 2017-03-24 DIAGNOSIS — I2582 Chronic total occlusion of coronary artery: Secondary | ICD-10-CM | POA: Diagnosis not present

## 2017-03-24 LAB — GLUCOSE, CAPILLARY: Glucose-Capillary: 166 mg/dL — ABNORMAL HIGH (ref 65–99)

## 2017-03-24 MED FILL — Heparin Sodium (Porcine) 2 Unit/ML in Sodium Chloride 0.9%: INTRAMUSCULAR | Qty: 1000 | Status: AC

## 2017-03-24 MED FILL — Gentamicin Sulfate Inj 40 MG/ML: INTRAMUSCULAR | Qty: 80 | Status: AC

## 2017-03-24 NOTE — Progress Notes (Signed)
Patient and wife received discharge information and acknowledged understanding of it. Patient received femoral site care education. Patient IV was removed.

## 2017-03-29 ENCOUNTER — Telehealth: Payer: Self-pay | Admitting: Cardiology

## 2017-03-29 MED ORDER — APIXABAN 5 MG PO TABS: 5.0000 mg | ORAL_TABLET | Freq: Two times a day (BID) | ORAL | 0 refills | Status: DC

## 2017-03-29 NOTE — Telephone Encounter (Signed)
New message ° °Patient calling the office for samples of medication: ° ° °1.  What medication and dosage are you requesting samples for? Eliquis ° °2.  Are you currently out of this medication? no ° ° °

## 2017-03-29 NOTE — Telephone Encounter (Signed)
SPOKE TO PATIENT . SAMPLES AVAILABLE FOR PICK

## 2017-03-30 ENCOUNTER — Ambulatory Visit (HOSPITAL_COMMUNITY): Payer: Medicare Other

## 2017-03-30 ENCOUNTER — Ambulatory Visit (HOSPITAL_COMMUNITY)
Admission: RE | Admit: 2017-03-30 | Discharge: 2017-03-30 | Disposition: A | Discharge status: Home / Self Care | Payer: Medicare Other | Source: Ambulatory Visit | Attending: Cardiovascular Disease | Admitting: Cardiovascular Disease

## 2017-03-30 DIAGNOSIS — I350 Nonrheumatic aortic (valve) stenosis: Secondary | ICD-10-CM | POA: Insufficient documentation

## 2017-03-30 DIAGNOSIS — I2510 Atherosclerotic heart disease of native coronary artery without angina pectoris: Secondary | ICD-10-CM | POA: Diagnosis not present

## 2017-03-30 DIAGNOSIS — I700 Atherosclerosis of aorta: Secondary | ICD-10-CM | POA: Diagnosis not present

## 2017-03-30 DIAGNOSIS — J988 Other specified respiratory disorders: Secondary | ICD-10-CM | POA: Diagnosis not present

## 2017-03-30 LAB — PULMONARY FUNCTION TEST
DL/VA % PRED: 60 %
DL/VA: 2.87 ml/min/mmHg/L
DLCO UNC: 14.47 ml/min/mmHg
DLCO unc % pred: 41 %
FEF 25-75 POST: 3.04 L/s
FEF 25-75 Pre: 1.29 L/sec
FEF2575-%Change-Post: 134 %
FEF2575-%PRED-POST: 128 %
FEF2575-%Pred-Pre: 54 %
FEV1-%Change-Post: 22 %
FEV1-%PRED-PRE: 51 %
FEV1-%Pred-Post: 63 %
FEV1-Post: 2.09 L
FEV1-Pre: 1.7 L
FEV1FVC-%Change-Post: 6 %
FEV1FVC-%PRED-PRE: 104 %
FEV6-%Change-Post: 17 %
FEV6-%Pred-Post: 60 %
FEV6-%Pred-Pre: 51 %
FEV6-Post: 2.58 L
FEV6-Pre: 2.19 L
FEV6FVC-%PRED-POST: 106 %
FEV6FVC-%Pred-Pre: 106 %
FVC-%Change-Post: 14 %
FVC-%PRED-POST: 56 %
FVC-%PRED-PRE: 49 %
FVC-POST: 2.58 L
FVC-PRE: 2.25 L
PRE FEV1/FVC RATIO: 76 %
Post FEV1/FVC ratio: 81 %
Post FEV6/FVC ratio: 100 %
Pre FEV6/FVC Ratio: 100 %
RV % pred: 238 %
RV: 6.4 L
TLC % pred: 126 %
TLC: 9.45 L

## 2017-03-30 MED ORDER — ALBUTEROL SULFATE (2.5 MG/3ML) 0.083% IN NEBU
2.5000 mg | INHALATION_SOLUTION | Freq: Once | RESPIRATORY_TRACT | Status: AC
  Administered 2017-03-30: 2.5 mg via RESPIRATORY_TRACT

## 2017-03-30 MED ORDER — IOPAMIDOL (ISOVUE-370) INJECTION 76%
INTRAVENOUS | Status: AC
  Administered 2017-03-30: 100 mL
  Filled 2017-03-30: qty 100

## 2017-04-03 ENCOUNTER — Ambulatory Visit (INDEPENDENT_AMBULATORY_CARE_PROVIDER_SITE_OTHER): Payer: Medicare Other | Admitting: *Deleted

## 2017-04-03 DIAGNOSIS — R001 Bradycardia, unspecified: Secondary | ICD-10-CM

## 2017-04-03 LAB — CUP PACEART INCLINIC DEVICE CHECK
Battery Voltage: 3.05 V
Brady Statistic RV Percent Paced: 92 %
Date Time Interrogation Session: 20190311142106
Implantable Lead Implant Date: 20190228
Implantable Lead Location: 753860
Lead Channel Pacing Threshold Amplitude: 0.75 V
Lead Channel Pacing Threshold Amplitude: 0.75 V
Lead Channel Pacing Threshold Pulse Width: 0.5 ms
Lead Channel Sensing Intrinsic Amplitude: 12 mV
Lead Channel Setting Pacing Amplitude: 0.875
MDC IDC MSMT BATTERY REMAINING LONGEVITY: 135 mo
MDC IDC MSMT LEADCHNL RV IMPEDANCE VALUE: 587.5 Ohm
MDC IDC MSMT LEADCHNL RV PACING THRESHOLD PULSEWIDTH: 0.5 ms
MDC IDC PG IMPLANT DT: 20190228
MDC IDC SET LEADCHNL RV PACING PULSEWIDTH: 0.5 ms
MDC IDC SET LEADCHNL RV SENSING SENSITIVITY: 2 mV
Pulse Gen Model: 1272
Pulse Gen Serial Number: 8971140

## 2017-04-03 NOTE — Progress Notes (Signed)
Wound check appointment. Steri-strips removed. Wound without redness or edema. Incision edges approximated, wound well healed. Normal device function. Threshold, sensing, and impedance consistent with implant measurements. Device programmed at with auto capture programmed on. Histogram distribution appropriate for patient and level of activity. No high ventricular rates noted. Patient educated about wound care, arm mobility, lifting restrictions. ROV 06/20/17

## 2017-04-10 ENCOUNTER — Institutional Professional Consult (permissible substitution) (INDEPENDENT_AMBULATORY_CARE_PROVIDER_SITE_OTHER): Payer: Medicare Other | Admitting: Surgery

## 2017-04-10 ENCOUNTER — Other Ambulatory Visit: Payer: Self-pay

## 2017-04-10 ENCOUNTER — Ambulatory Visit: Payer: Medicare Other | Attending: Cardiovascular Disease | Admitting: Physical Therapy

## 2017-04-10 ENCOUNTER — Encounter: Payer: Self-pay | Admitting: Surgery

## 2017-04-10 ENCOUNTER — Encounter: Payer: Self-pay | Admitting: Physical Therapy

## 2017-04-10 VITALS — BP 148/71 | HR 63 | Resp 20 | Ht 72.0 in | Wt 268.0 lb

## 2017-04-10 DIAGNOSIS — R2689 Other abnormalities of gait and mobility: Secondary | ICD-10-CM | POA: Insufficient documentation

## 2017-04-10 DIAGNOSIS — I6523 Occlusion and stenosis of bilateral carotid arteries: Secondary | ICD-10-CM | POA: Diagnosis not present

## 2017-04-10 DIAGNOSIS — R293 Abnormal posture: Secondary | ICD-10-CM | POA: Diagnosis present

## 2017-04-10 DIAGNOSIS — I350 Nonrheumatic aortic (valve) stenosis: Secondary | ICD-10-CM | POA: Diagnosis not present

## 2017-04-10 NOTE — Progress Notes (Signed)
Patient ID: Evan [name], male   DOB: 06/27/1940, 77 y.o.   MRN: [id number]  [name] CONSULTATION REPORT  Referring Provider is [name], [name] M, MD PCP is Leonel Ramsay, MD  Chief Complaint  Patient presents with  . Aortic Stenosis    1st TAVR consultation    HPI:  The patient is a 77 year old gentleman with a history of hypertension, hyperlipidemia, type 2 diabetes, permanent atrial fibrillation with a slow ventricular rate status post recent permanent pacemaker, carotid artery disease status post left carotid endarterectomy, history of stroke in 2017 with hemorrhagic transformation, history of GI bleeding, history of coronary artery disease status post coronary artery bypass graft surgery x3 at [facility] in 2005, and aortic stenosis.  He reports a 6-32-month history of progressive shortness of breath and exertional fatigue.  This was occurring with low level activity like getting dressed and walking short distances.  He had a 24-hour Holter monitor performed on 02/15/2017 which showed atrial fibrillation with a slow ventricular response and frequent pauses up to 5.08 seconds.  An echocardiogram on 03/01/2017 showed severe aortic stenosis with a mean gradient of 38 mmHg and a valve area of 0.64 cm.  The dimensionless index was 0.19.  Left ventricular ejection fraction was 55-60%.  Cardiac catheterization on 03/23/2017 showed severe native three-vessel coronary disease with total occlusion of the right coronary artery, severe left main coronary stenosis, and total occlusion of the LAD.  There was severe stenosis of the left circumflex and its branch vessels.  He had continued patency of the left internal mammary graft to the LAD, continued patency of the free radial artery graft to the obtuse marginal branch with a tight stenosis at the distal anastomosis involving a small caliber vessel.  There is continued patency of  the saphenous vein graft to the posterior descending artery.  The mean aortic valve gradient was 40 mmHg.  PA pressure was markedly elevated at 72/25 with a mean of 38.  He was evaluated for conduction disease with atrial fibrillation with a slow ventricular response and trifascicular block with a heart rate that he said was in the 40s-50s.  He underwent permanent dual-chamber pacemaker implantation by Dr. Curt Bears on 03/24/2017.  He said that he noticed significant improvement in his shortness of breath after pacemaker implantation and his heart rate has been consistently in the 60s.  He is here with his wife today who said that she feels like he has better since pacemaker implantation but is still limited by exertional shortness of breath and fatigue.  He denies any chest pain or pressure.  He denies dizziness and syncope.  He denies orthopnea and PND.  He has had mild edema in his lower legs and ankles.  Past Medical History:  Diagnosis Date  . Anemia   . Carotid arterial disease ([place])   . CHF (congestive heart failure) ([place])   . Coronary artery disease   . HOH (hard of hearing)   . Hyperlipidemia   . Hypertension   . Nocturia   . Obesity   . Osteoarthritis    "BUE; BLE; right knee" (03/23/2017)  . Peripheral vision loss, bilateral    S/P 07/2015  . Permanent atrial fibrillation ([place])   . 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 Severe aortic stenosis   . Sinus drainage   . Stroke [facility]) 07/2015   "peripheral vision still bad out of left eye" (03/23/2017)  . Torn rotator  cuff 2012   Right arm  . Type II diabetes mellitus ([place])   . Urinary frequency     Past Surgical History:  Procedure Laterality Date  . BACK SURGERY    . CARDIAC CATHETERIZATION  11/29/2005   SEVERE THREE VESSEL OBSTRUCTIVE ATHERSCLEROTIC CAD. ALL GRAFTS WERE PATENT. EF 65%  . CARDIAC CATHETERIZATION  03/23/2017  . CAROTID ENDARTERECTOMY Right 01-29-08   cea  . CARPAL TUNNEL RELEASE Right   . CORONARY  ARTERY BYPASS GRAFT  03/2003   X3. LIMA GRAFT TO THE LAD, SAPHENOUS VEIN GRAFT TO THE PA, AND A LEFT RADIAL GRAFT TO THEOBTUSE MARGINAL VESSEL  . ENDARTERECTOMY Left 05/28/2012   Procedure: ENDARTERECTOMY CAROTID;  Surgeon: Elam Dutch, MD;  Location: [place];  Service: Vascular;  Laterality: Left;  . INSERT / REPLACE / REMOVE PACEMAKER  03/23/2017  . [place]; 1984  . MOLE SURGERY     "? face/arms"  . PACEMAKER IMPLANT N/A 03/23/2017   Procedure: PACEMAKER IMPLANT;  Surgeon: Constance Haw, MD;  Location: [place] [facility];  Service: Cardiovascular;  Laterality: N/A;  . PATCH ANGIOPLASTY Left 05/28/2012   Procedure: WITH dACRON PATCH ANGIOPLASTY;  Surgeon: Elam Dutch, MD;  Location: [place];  Service: Vascular;  Laterality: Left;  . RIGHT/LEFT HEART CATH AND CORONARY/GRAFT ANGIOGRAPHY N/A 03/23/2017   Procedure: RIGHT/LEFT HEART CATH AND CORONARY/GRAFT ANGIOGRAPHY;  Surgeon: Sherren Mocha, MD;  Location: [place] [facility];  Service: Cardiovascular;  Laterality: N/A;    Family History  Problem Relation Age of Onset  . Heart attack Father   . Heart attack Brother   . Parkinsonism Brother   . Cancer Brother   . Arrhythmia Brother     Social History   Socioeconomic History  . Marital status: Married    Spouse name: Not on file  . Number of children: 5  . Years of education: Not on file  . Highest education level: Not on file  Social Needs  . Financial resource strain: Not on file  . Food insecurity - worry: Not on file  . Food insecurity - inability: Not on file  . Transportation needs - medical: Not on file  . Transportation needs - non-medical: Not on file  Occupational History  . Occupation: retired    Fish farm manager: RETIRED  Tobacco Use  . Smoking status: Former Smoker    Packs/day: 2.00    Years: 39.00    Pack years: 78.00    Types: Cigarettes    Last attempt to quit: 01/24/1994    Years since quitting: 23.2  . Smokeless tobacco: Never Used    Substance and Sexual Activity  . Alcohol use: No  . Drug use: No  . Sexual activity: No  Other Topics Concern  . Not on file  Social History Narrative  . Not on file    Current Outpatient Medications  Medication Sig Dispense Refill  . acetaminophen (TYLENOL) 650 MG CR tablet Take 1,300 mg by mouth every 8 (eight) hours as needed for pain.    Marland Kitchen amLODipine (NORVASC) 2.5 MG tablet Take 2.5 mg by mouth daily.    Marland Kitchen apixaban (ELIQUIS) 5 MG TABS tablet Take 1 tablet (5 mg total) by mouth 2 (two) times daily. 28 tablet 0  . benazepril (LOTENSIN) 40 MG tablet Take 1 tablet (40 mg total) by mouth daily. 90 tablet 3  . Cyanocobalamin (RA VITAMIN B-12 TR) 1000 MCG TBCR Take 1,000 mcg by mouth daily.     Marland Kitchen  ezetimibe (ZETIA) 10 MG tablet Take 1 tablet (10 mg total) by mouth daily. (Patient taking differently: Take 10 mg by mouth at bedtime. ) 30 tablet 11  . fluticasone (FLONASE) 50 MCG/ACT nasal spray Place 2 sprays into both nostrils daily.    Marland Kitchen glimepiride (AMARYL) 2 MG tablet Take 2 mg by mouth daily with breakfast.    . hydrochlorothiazide (HYDRODIURIL) 25 MG tablet Take 1 tablet (25 mg total) by mouth every morning. 90 tablet 1  . OVER THE COUNTER MEDICATION Replenex tablets: Take 3 tablet by mouth every morning (joint health)    . Polyvinyl Alcohol-Povidone (REFRESH OP) Place 2 drops into both eyes daily as needed (for dry eyes).     . rosuvastatin (CRESTOR) 20 MG tablet Take 1 tablet (20 mg total) by mouth daily. (Patient taking differently: Take 20 mg by mouth every evening. ) 30 tablet 0  . tamsulosin (FLOMAX) 0.4 MG CAPS capsule TAKE 1 CAPSULE BY MOUTH ONCE DAILY (Patient taking differently: TAKE 0.4 MG BY MOUTH ONCE DAILY IN THE EVENING) 90 capsule 3   No current facility-administered medications for this visit.     Allergies  Allergen Reactions  . Niacin And Related Rash and Other (See Comments)    Red face and burns.  . Metformin And Related Diarrhea  . Statins Other (See  Comments)    Myalgias, weakness      Review of Systems:   General:  normal appetite, decreased energy, no weight gain, no weight loss, no fever  Cardiac:  no chest pain with exertion, no chest pain at rest, + SOB with mild exertion, no resting SOB, no PND, no orthopnea, no palpitations, + arrhythmia, + atrial fibrillation, + LE edema, no dizzy spells, no syncope  Respiratory:  exertional shortness of breath, no home oxygen, no productive cough, no dry cough, no bronchitis, no wheezing, no hemoptysis, no asthma, no pain with inspiration or cough, no sleep apnea, no CPAP at night  GI:   no difficulty swallowing, no reflux, no frequent heartburn, no hiatal hernia, no abdominal pain, no constipation, no diarrhea, no hematochezia, no hematemesis, no melena  GU:   no dysuria,  + frequency, no urinary tract infection, no hematuria, no enlarged prostate, no kidney stones, no kidney disease  Vascular:  + pain suggestive of claudication, no pain in feet, no leg cramps, no varicose veins, no DVT, no non-healing foot ulcer  Neuro:   + stroke, no TIA's, no seizures, no headaches, no temporary blindness one eye,  no slurred speech, no peripheral neuropathy, no chronic pain, no instability of gait, no memory/cognitive dysfunction  Musculoskeletal: + arthritis, + joint swelling, + myalgias, no difficulty walking, normal mobility   Skin:   no rash, no itching, no skin infections, no pressure sores or ulcerations  Psych:   no anxiety, + depression, no nervousness, no unusual recent stress  Eyes:   no blurry vision, no floaters, + recent vision changes, + wears glasses or contacts  ENT:   + hearing loss, no loose or painful teeth, + dentures  Hematologic:  + easy bruising, no abnormal bleeding, no clotting disorder, no frequent epistaxis  Endocrine:  + diabetes, does  check CBG's at home      Physical Exam:   BP (!) 148/71 (BP Location: Left Arm, Patient Position: Sitting, Cuff Size: Large)   Pulse 63    Resp 20   Ht 6' (1.829 m)   Wt 268 lb (121.6 kg)   SpO2 93% Comment: RA  BMI 36.35 kg/m   General:  Obese but  well-appearing  HEENT:  Unremarkable, NCAT, PERLA, EOMI, oropharynx clear  Neck:   no JVD, no bruits, no adenopathy or thyromegaly  Chest:   clear to auscultation, symmetrical breath sounds, no wheezes, no rhonchi   CV:   RRR, grade III/VI crescendo/decrescendo murmur heard best at RSB,  no diastolic murmur  Abdomen:  soft, non-tender, no masses or organomegaly  Extremities:  warm, well-perfused, pulses diminished in feet, mild LE edema  Rectal/GU  Deferred  Neuro:   Grossly non-focal and symmetrical throughout  Skin:   Clean and dry, no rashes, no breakdown   Diagnostic Tests:       [facility]                        [address], [place] [address]                            [phone]  ------------------------------------------------------------------- Transthoracic Echocardiography  Patient:    Hjalmar, Ballengee MR #:       [id number] Study Date: 03/01/2017 Gender:     M Age:        29 Height:     182.9 cm Weight:     121.6 kg BSA:        2.53 m^2 Pt. Status: Room:   SONOGRAPHER  Diamond Nickel  ATTENDING    Loralie Champagne, M.D.  PERFORMING   [facility]  ORDERING     Will Camnitz, MD  [place], MD  cc:  ------------------------------------------------------------------- LV EF: 55% -   60%  ------------------------------------------------------------------- Indications:      I48.2 Chronic atrial fibrillation. I35.0 Non-rheumatic aortic valve stenosis.  ------------------------------------------------------------------- History:   PMH:  Obesity.  Dyspnea and murmur.  Coronary artery disease.  Stroke.  Risk factors:  Hypertension. Diabetes mellitus. Dyslipidemia.  ------------------------------------------------------------------- Study Conclusions  - Left ventricle: The  cavity size was normal. Wall thickness was   increased in a pattern of severe LVH. Systolic function was   normal. The estimated ejection fraction was in the range of 55%   to 60%. Indeterminant diastolic function (atrial fibrillation).   Wall motion was normal; there were no regional wall motion   abnormalities. - Aortic valve: Probably trileaflet; severely calcified leaflets.   There was severe stenosis. Mean gradient (S): 38 mm Hg. Valve   area (VTI): 0.64 cm^2. - Mitral valve: Moderately calcified annulus. There was trivial   regurgitation. - Left atrium: The atrium was severely dilated. - Right ventricle: The cavity size was normal. Systolic function   was mildly to moderately reduced. - Right atrium: The atrium was mildly dilated. - Pulmonary arteries: No complete TR doppler jet so unable to   estimate PA systolic pressure. - Systemic veins: IVC measured 2.3 cm with < 50% respirophasic   variation, suggesting RA pressure 15 mmHg.  Impressions:  - The patient was in atrial fibrillation. Normal LV size with   severe LV hypertrophy. EF 55-60%. Normal RV size with mild to   moderately decreased systolic function. Aortic stenosis appeared   severe. Mean gradient was not quite to severe range but visually   and by calculated AVA, there was severe aortic stenosis.  ------------------------------------------------------------------- Study data:  Comparison was made to the study of 08/25/2015.  Study status:  Routine.  Procedure:  The patient reported no pain pre or post test. Transthoracic echocardiography. Image quality was adequate. The study was technically difficult, as a result of poor sound wave transmission and body habitus. Intravenous contrast (Definity) was administered.  Study completion:  There were no complications.          Transthoracic echocardiography.  M-mode, complete 2D, spectral Doppler, and color Doppler.  Birthdate: Patient birthdate: 1940-10-28.  Age:   Patient is 77 yr old.  Sex: Gender: male.    BMI: 36.3 kg/m^2.  Blood pressure:     174/78 Patient status:  Outpatient.  Study date:  Study date: 03/01/2017. Study time: 10:50 AM.  Location:  [facility]  -------------------------------------------------------------------  ------------------------------------------------------------------- Left ventricle:  The cavity size was normal. Wall thickness was increased in a pattern of severe LVH. Systolic function was normal. The estimated ejection fraction was in the range of 55% to 60%. Indeterminant diastolic function (atrial fibrillation). Wall motion was normal; there were no regional wall motion abnormalities.  ------------------------------------------------------------------- Aortic valve:   Probably trileaflet; severely calcified leaflets. Doppler:   There was severe stenosis.   There was no regurgitation.    VTI ratio of LVOT to aortic valve: 0.2. Valve area (VTI): 0.64 cm^2. Indexed valve area (VTI): 0.25 cm^2/m^2. Peak velocity ratio of LVOT to aortic valve: 0.19. Valve area (Vmax): 0.6 cm^2. Indexed valve area (Vmax): 0.24 cm^2/m^2. Mean velocity ratio of LVOT to aortic valve: 0.2. Valve area (Vmean): 0.63 cm^2. Indexed valve area (Vmean): 0.25 cm^2/m^2.    Mean gradient (S): 38 mm Hg. Peak gradient (S): 73 mm Hg.  ------------------------------------------------------------------- Aorta:  Aortic root: The aortic root was normal in size. Ascending aorta: The ascending aorta was normal in size.  ------------------------------------------------------------------- Mitral valve:   Moderately calcified annulus.  Doppler:   There was no evidence for stenosis.   There was trivial regurgitation.  ------------------------------------------------------------------- Left atrium:  The atrium was severely dilated.  ------------------------------------------------------------------- Right ventricle:  The cavity size was  normal. Systolic function was mildly to moderately reduced.  ------------------------------------------------------------------- Pulmonic valve:    Structurally normal valve.   Cusp separation was normal.  Doppler:  Transvalvular velocity was within the normal range. There was no regurgitation.  ------------------------------------------------------------------- Tricuspid valve:   Doppler:  There was no significant regurgitation.  ------------------------------------------------------------------- Pulmonary artery:   No complete TR doppler jet so unable to estimate PA systolic pressure.  ------------------------------------------------------------------- Right atrium:  The atrium was mildly dilated.  ------------------------------------------------------------------- Pericardium:  There was no pericardial effusion.  ------------------------------------------------------------------- Systemic veins:  IVC measured 2.3 cm with < 50% respirophasic variation, suggesting RA pressure 15 mmHg.  ------------------------------------------------------------------- Measurements   Left ventricle                           Value          Reference  LV ID, ED, PLAX chordal                  45    mm       43 - 52  LV ID, ES, PLAX chordal                  34    mm       23 - 38  LV fx shortening, PLAX chordal   (L)     24    %        >=  29  LV PW thickness, ED                      17    mm       ----------  IVS/LV PW ratio, ED                      1.24           <=1.3  Stroke volume, 2D                        64    ml       ----------  Stroke volume/bsa, 2D                    25    ml/m^2   ----------    Ventricular septum                       Value          Reference  IVS thickness, ED                        21    mm       ----------    LVOT                                     Value          Reference  LVOT ID, S                               20    mm       ----------  LVOT area                                 3.14  cm^2     ----------  LVOT peak velocity, S                    81.5  cm/s     ----------  LVOT mean velocity, S                    54.8  cm/s     ----------  LVOT VTI, S                              20.3  cm       ----------    Aortic valve                             Value          Reference  Aortic valve peak velocity, S            427   cm/s     ----------  Aortic valve mean velocity, S            271   cm/s     ----------  Aortic valve VTI, S                      99.7  cm       ----------  Aortic mean gradient, S                  35    mm Hg    ----------  Aortic peak gradient, S                  73    mm Hg    ----------  VTI ratio, LVOT/AV                       0.2            ----------  Aortic valve area, VTI                   0.64  cm^2     ----------  Aortic valve area/bsa, VTI               0.25  cm^2/m^2 ----------  Velocity ratio, peak, LVOT/AV            0.19           ----------  Aortic valve area, peak velocity         0.6   cm^2     ----------  Aortic valve area/bsa, peak              0.24  cm^2/m^2 ----------  velocity  Velocity ratio, mean, LVOT/AV            0.2            ----------  Aortic valve area, mean velocity         0.63  cm^2     ----------  Aortic valve area/bsa, mean              0.25  cm^2/m^2 ----------  velocity    Aorta                                    Value          Reference  Aortic root ID, ED                       34    mm       ----------    Left atrium                              Value          Reference  LA ID, A-P, ES                           51    mm       ----------  LA ID/bsa, A-P                           2.02  cm/m^2   <=2.2  LA volume, S                             133   ml       ----------  LA volume/bsa, S                         52.6  ml/m^2   ----------  LA volume, ES,  1-p A4C                   144   ml       ----------  LA volume/bsa, ES, 1-p A4C               56.9  ml/m^2   ----------  LA  volume, ES, 1-p A2C                   119   ml       ----------  LA volume/bsa, ES, 1-p A2C               47.1  ml/m^2   ----------    Right atrium                             Value          Reference  RA ID, S-I, ES, A4C              (H)     56.6  mm       34 - 49  RA area, ES, A4C                 (H)     21.3  cm^2     8.3 - 19.5  RA volume, ES, A/L                       68.5  ml       ----------  RA volume/bsa, ES, A/L                   27.1  ml/m^2   ----------  Legend: (L)  and  (H)  mark values outside specified reference range.  ------------------------------------------------------------------- Prepared and Electronically Authenticated by  Loralie Champagne, M.D. 2019-02-06T15:51:49  Physicians   Panel Physicians Referring Physician Case Authorizing Physician  Sherren Mocha, MD (Primary)    Procedures   RIGHT/LEFT HEART CATH AND CORONARY/GRAFT ANGIOGRAPHY  Conclusion     Mid RCA lesion is 100% stenosed.  Ost LM to Dist LM lesion is 70% stenosed.  Prox Cx lesion is 90% stenosed.  Ost 2nd Mrg lesion is 100% stenosed.  Prox LAD-1 lesion is 90% stenosed.  Prox LAD-2 lesion is 100% stenosed.  LIMA and is normal in caliber.  The graft exhibits no disease.  SVG graft was visualized by angiography and is normal in caliber.  Left radial artery graft was visualized by angiography.   1.  Severe native three-vessel coronary artery disease with total occlusion of the RCA, severe left main stenosis, total occlusion of the LAD with severe calcification, and severe stenosis of the circumflex and its branch vessels.  2.  Status post aortocoronary bypass surgery with continued patency of the LIMA to LAD, continued patency of the free radial graft to OM with tight stenosis at the distal anastomosis involving a small caliber vessel, and continued patency of the saphenous vein graft to PDA without any significant disease.  3.  Severe aortic valve stenosis with a mean  gradient of 40 mmHg.  The calculated valve area is 1.74 cm likely related to high cardiac output  4.  Moderate pulmonary hypertension with a PA pressure of 72/25 with a mean of 38   Recommendations: The patient will undergo permanent pacemaker placement for symptomatic atrial fibrillation with slow ventricular rate.  He will continue to undergo evaluation  by the multidisciplinary heart team for consideration of treatment options for his severe aortic stenosis.   Indications   Severe aortic stenosis [I35.0 (ICD-10-CM)]  Procedural Details/Technique   Technical Details INDICATION: Severe aortic stenosis, chronic diastolic CHF  PROCEDURAL DETAILS: The right groin was prepped, draped, and anesthetized with 1% lidocaine. Using the modified Seldinger technique a 5 French sheath was placed in the right femoral artery and a 7 French sheath was placed in the right femoral vein. A Swan-Ganz catheter was used for the right heart catheterization. Standard protocol was followed for recording of right heart pressures and sampling of oxygen saturations. The Swan-Ganz catheter would not advance into the pulmonary capillary wedge position. Fick cardiac output was calculated. Standard Judkins catheters were used for selective coronary angiography. It was difficult to manipulate catheters because of iliac tortuosity. The 11 cm sheath was changed out for a 25 cm sheath. For the native coronaries, a JR4 and JL 5 catheter were used. For the bypass grafts, a JR4 was used for the free radial graft, a LIMA catheter was used for the LIMA graft, and a multipurpose catheter was used for the saphenous vein to PDA graft. An AL-1 catheter and straight tip wire were used to cross the aortic valve and recorded pullback gradient. There were no immediate procedural complications. The patient was transferred to the post catheterization recovery area for further monitoring.    Estimated blood loss <50 mL.  During this  procedure the patient was administered the following to achieve and maintain moderate conscious sedation: Versed 2 mg, Fentanyl 25 mcg, while the patient's heart rate, blood pressure, and oxygen saturation were continuously monitored. The period of conscious sedation was 46 minutes, of which I was present face-to-face 100% of this time.  Coronary Findings   Diagnostic  Dominance: Right  Left Main  Ost LM to Dist LM lesion 70% stenosed  Ost LM to Dist LM lesion is 70% stenosed. The lesion is severely calcified. The left mainstem is heavily calcified with diffuse 70% stenosis  Left Anterior Descending  Prox LAD-1 lesion 90% stenosed  Prox LAD-1 lesion is 90% stenosed. The lesion is calcified. The proximal LAD has 90% severely calcific stenosis. The first diagonal branch is patent. The LAD occludes prior to the second diagonal origin  Prox LAD-2 lesion 100% stenosed  Prox LAD-2 lesion is 100% stenosed. The lesion is severely calcified.  Left Circumflex  Prox Cx lesion 90% stenosed  Prox Cx lesion is 90% stenosed. The lesion is severely calcified. The circumflex is severely diseased. There is 90% complex calcified stenosis at the bifurcation of the AV circumflex and first obtuse marginal branch. The obtuse marginal branch is diffusely diseased and then divides into twin vessels.  Second Obtuse Marginal Branch  Ost 2nd Mrg lesion 100% stenosed  Ost 2nd Mrg lesion is 100% stenosed. The second OM branch is severely calcified. The vessel is subtotally occluded and fills late, dividing into twin vessels distally.  Right Coronary Artery  Mid RCA lesion 100% stenosed  Mid RCA lesion is 100% stenosed. The lesion is severely calcified. This is a severely diseased vessel throughout its proximal portion which then occludes in the mid vessel. The distal vessel fills from the saphenous vein graft.  Free LIMA Graft to Mid LAD  LIMA and is normal in caliber. The graft exhibits no disease. The LIMA to LAD is  widely patent without significant stenosis.  saphenous Graft to RPDA  SVG graft was visualized by angiography and is normal in  caliber. The saphenous vein graft to PDA is widely patent without significant stenosis. This fills the PDA and a large PLA branch and also supplies some collaterals to the left circumflex distribution.  Left Radial Artery Graft to 1st Mrg  Left radial artery graft was visualized by angiography. The free left radial graft to the obtuse marginal branch is small throughout. The graft has a tight stenosis at the distal coronary anastomotic site. The target vessel appears small.  Intervention   No interventions have been documented.  Coronary Diagrams   Diagnostic Diagram       Implants     No implant documentation for this case.  MERGE Images   Show images for CARDIAC CATHETERIZATION   Link to Procedure Log   Procedure Log    Hemo Data    Most Recent Value  Fick Cardiac Output 8.58 L/min  Fick Cardiac Output Index 3.55 (L/min)/BSA  Aortic Mean Gradient 40.2 mmHg  Aortic Peak Gradient 37 mmHg  Aortic Valve Area 1.74  Aortic Value Area Index 0.72 cm2/BSA  RA A Wave -99 mmHg  RA V Wave 14 mmHg  RA Mean 12 mmHg  RV Systolic Pressure 69 mmHg  RV Diastolic Pressure 4 mmHg  RV EDP 10 mmHg  PA Systolic Pressure 72 mmHg  PA Diastolic Pressure 25 mmHg  PA Mean 38 mmHg  AO Systolic Pressure 932 mmHg  AO Diastolic Pressure 69 mmHg  AO Mean 355 mmHg  LV Systolic Pressure 732 mmHg  LV Diastolic Pressure 13 mmHg  LV EDP 21 mmHg  Arterial Occlusion Pressure Extended Systolic Pressure 202 mmHg  Arterial Occlusion Pressure Extended Diastolic Pressure 73 mmHg  Arterial Occlusion Pressure Extended Mean Pressure 114 mmHg  Left Ventricular Apex Extended Systolic Pressure 542 mmHg  Left Ventricular Apex Extended Diastolic Pressure 12 mmHg  Left Ventricular Apex Extended EDP Pressure 20 mmHg  QP/QS 1  TPVR Index 10.73 HRUI  TSVR Index 29.35 HRUI  TPVR/TSVR  Ratio 0.37    ADDENDUM REPORT: 03/31/2017 13:54  CLINICAL DATA:  Aortic stenosis  EXAM: Cardiac TAVR CT  TECHNIQUE: The patient was scanned on a [employer] scanner. A 120 kV retrospective scan was triggered in the descending thoracic aorta at 111 HU's. Gantry rotation speed was 270 msecs and collimation was .9 mm. No beta blockade or nitro were given. The 3D data set was reconstructed in 5% intervals of the R-R cycle. Systolic and diastolic phases were analyzed on a dedicated work station using MPR, MIP and VRT modes. The patient received 80 cc of contrast.  FINDINGS: Aortic Valve: Tri leaflet and severely calcified with restricted motion  Aorta: Severe calcific aortic atherosclerosis  Sinotubular Junction: 30 mm  Ascending Thoracic Aorta: 34 mm  Aortic Arch: 31 mm  Descending Thoracic Aorta: 26 mm  Sinus of Valsalva Measurements:  Non-coronary: 30.8 mm  Right -coronary: 31.4 mm  Left -coronary: 31.4 mm  Coronary Artery Height above Annulus:  Left Main: 12.8 mm above annulus  Right Coronary: 15 mm above annulus  Virtual Basal Annulus Measurements:  Maximum/Minimum Diameter: 28.2 mm x 21.9 mm  Perimeter: 80 mm  Area: 476 mm2  Coronary Arteries: Sufficient height above annulus for deployment Patent SVG to RCA Patent free RIMA to OM and patent LIMA to LAD  Optimum Fluoroscopic Angle for Delivery: LAO 16 degrees Caudal 12 degrees  IMPRESSION: 1. Calcified tri leaflet AV with annulus 476 mm2 suitable for a 26 mm Sapien 3 valve  2. Severe calcific aortic atherosclerosis with bovine arch and  normal root diameter 3.4 mm  3.  Patent SVG to RCA, patent free RIMA to OM and patent LIMA to LAD  4.  Severe LAE with layering of contrast in LAA cannot r/o thrombus  5.  Pacing wires in RA/RV  6. Optimum angiographic angle for deployment LAO 16 degrees Caudal 12 degrees  7.  Coronary arteries suitable height above annulus for  deployment  Jenkins Rouge   Electronically Signed   By: Jenkins Rouge M.D.   On: 03/31/2017 13:54   Addended by Josue Hector, MD on 03/31/2017 1:56 PM    Study Result   EXAM: OVER-READ INTERPRETATION  CT CHEST  The following report is an over-read performed by radiologist Dr. Vinnie Langton of [facility], [place] on 03/30/2017. This over-read does not include interpretation of cardiac or coronary anatomy or pathology. The coronary calcium score/coronary CTA interpretation by the cardiologist is attached.  COMPARISON:  None.  FINDINGS: Extracardiac findings will be described separately under dictation for contemporaneously obtained CTA chest, abdomen and pelvis.  IMPRESSION: Please see separate dictation for contemporaneously obtained CTA chest, abdomen and pelvis dated 03/30/2017 for full description of relevant extracardiac findings.  Electronically Signed: By: Vinnie Langton M.D. On: 03/30/2017 14:31       CLINICAL DATA:  77 year old male with history of severe aortic stenosis. Preprocedural study prior to potential transcatheter aortic valve replacement (TAVR) procedure.  EXAM: CT ANGIOGRAPHY CHEST, ABDOMEN AND PELVIS  TECHNIQUE: Multidetector CT imaging through the chest, abdomen and pelvis was performed using the standard protocol during bolus administration of intravenous contrast. Multiplanar reconstructed images and MIPs were obtained and reviewed to evaluate the vascular anatomy.  CONTRAST:  80 mL of Isovue 370.  COMPARISON:  No priors.  FINDINGS: CTA CHEST FINDINGS  Cardiovascular: Heart size is mildly enlarged. There is no significant pericardial fluid, thickening or pericardial calcification. There is aortic atherosclerosis, as well as atherosclerosis of the great vessels of the mediastinum and the coronary arteries, including calcified atherosclerotic plaque in the left main, left anterior descending, left circumflex  and right coronary arteries. Status post median sternotomy for CABG including LIMA to the LAD. Severe dilatation of the pulmonic trunk (4.5 cm), concerning for pulmonary arterial hypertension.  Mediastinum/Lymph Nodes: No pathologically enlarged mediastinal or hilar lymph nodes. Esophagus is unremarkable in appearance. No axillary lymphadenopathy.  Lungs/Pleura: No suspicious appearing pulmonary nodules or masses. No acute consolidative airspace disease. No pleural effusions. Mild diffuse bronchial wall thickening with mild centrilobular and paraseptal emphysema.  Musculoskeletal/Soft Tissues: Median sternotomy wires. There are no aggressive appearing lytic or blastic lesions noted in the visualized portions of the skeleton.  CTA ABDOMEN AND PELVIS FINDINGS  Hepatobiliary: The liver has a shrunken appearance and nodular contour, suggestive of underlying cirrhosis. No definite cystic or solid hepatic lesions. No intra or extrahepatic biliary ductal dilatation. Gallbladder is normal in appearance.  Pancreas: No pancreatic mass. No pancreatic ductal dilatation. No pancreatic or peripancreatic fluid or inflammatory changes.  Spleen: Unremarkable.  Adrenals/Urinary Tract: 1.8 cm low-attenuation lesion in the lower pole of the right kidney is compatible with a simple cyst. Focal area of cortical thinning in the interpolar region of the right kidney, presumably scarring from prior infection or infarct. No suspicious renal lesions. Left kidney is normal in appearance. Bilateral adrenal glands are normal in appearance. No hydroureteronephrosis. Urinary bladder is normal in appearance.  Stomach/Bowel: Normal appearance of the stomach. No pathologic dilatation of small bowel or colon. Normal appendix.  Vascular/Lymphatic: Aortic atherosclerosis, with vascular findings  and measurements pertinent to potential TAVR procedure, as detailed below. Mild stenosis of the proximal  celiac axis. In the mid superior mesenteric artery (axial image 119 of series 8 and coronal image 75 of series 10) there is a moderate to severe stenosis. Inferior mesenteric artery appears widely patent without hemodynamically significant stenosis. Single right renal artery widely patent. Two left renal arteries, the largest of which demonstrates mild stenosis at the ostium. No lymphadenopathy noted in the abdomen or pelvis.  Reproductive: Prostate gland and seminal vesicles are unremarkable in appearance.  Other: No significant volume of ascites.  No pneumoperitoneum.  Musculoskeletal: There are no aggressive appearing lytic or blastic lesions noted in the visualized portions of the skeleton.  VASCULAR MEASUREMENTS PERTINENT TO TAVR:  AORTA:  Minimal Aortic Diameter-17 x 12 mm  Severity of Aortic Calcification-severe  RIGHT PELVIS:  Right Common Iliac Artery -  Minimal Diameter-9.5 x 8.5 mm  Tortuosity-mild  Calcification-moderate  Right External Iliac Artery -  Minimal Diameter-7.5 x 7.2 mm  Tortuosity-severe  Calcification-mild  Right Common Femoral Artery -  Minimal Diameter-7.9 x 5.5 mm  Tortuosity-mild  Calcification-moderate  LEFT PELVIS:  Left Common Iliac Artery -  Minimal Diameter-9.9 x 7.9 mm  Tortuosity-mild  Calcification-severe  Left External Iliac Artery -  Minimal Diameter-8.5 x 6.7 mm  Tortuosity-moderate to severe  Calcification-mild  Left Common Femoral Artery -  Minimal Diameter-nearly completely occluded and too small to accurately measure  Tortuosity-mild  Calcification-moderate  Review of the MIP images confirms the above findings.  IMPRESSION: 1. Vascular findings and measurements pertinent to potential TAVR procedure, as detailed above. 2. Severe thickening calcification of the aortic valve, compatible with the reported clinical history of severe aortic stenosis. 3. Aortic  atherosclerosis, in addition to left main and 3 vessel coronary artery disease. Status post median sternotomy for CABG including LIMA to the LAD. 4. Severe dilatation of the pulmonic trunk (4.5 cm in diameter), concerning for pulmonary arterial hypertension. 5. Morphologic changes in the liver indicative of mild cirrhosis. 6. 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 7. Additional incidental findings, as above.  Aortic Atherosclerosis (ICD10-I70.0).   Electronically Signed   By: Vinnie Langton M.D.   On: 03/30/2017 15:21   STS Risk Calculator: Procedure: Isolated AVR  Risk of Mortality: 3.269%  Renal Failure: 4.595%  Permanent Stroke: 2.221%  Prolonged Ventilation: 11.815%  DSW Infection: 0.274%  Reoperation: 3.125%  Morbidity or Mortality: 18.377%  Short Length of Stay: 28.968%  Long Length of Stay: 7.522%    Impression:  This 77 year old gentleman has stage D, severe, symptomatic aortic stenosis with New York Heart Association class II symptoms of exertional fatigue and shortness of breath consistent with chronic diastolic heart failure.  His shortness of breath has improved some following permanent pacemaker implantation for persistent atrial fibrillation with a slow ventricular response.  I have personally reviewed his echocardiogram, cardiac catheterization, and CTA studies.  His echocardiogram shows a trileaflet aortic valve that is severely calcified with restricted leaflet mobility.  The mean gradient is 38 mmHg with a dimensionless index of 0.19 consistent with severe aortic stenosis.  Cardiac catheterization shows severe diffuse multivessel coronary disease with patent bypass grafts.  The radial artery graft to the obtuse marginal branch is fairly small with a tight stenosis at the distal anastomosis but  the obtuse marginal was a  small vessel.  He has no anginal symptoms and I do not think there is any need for coronary revascularization.  I agree that aortic valve replacement is indicated in this patient for symptom relief and to prevent progressive left ventricular deterioration.  His operative risk for open surgical aortic valve replacement would be at least moderately elevated due to his age and reduced status as well as diabetes, stage II chronic kidney disease and obesity.  I think transcatheter aortic valve replacement would be the best option for him.  His gated cardiac CTA shows anatomy suitable for transcatheter aortic valve replacement using a 26 mm Sapien 3 valve.  There is severe left atrial enlargement with layering of contrast in the left atrial appendage and we cannot rule out left atrial appendage thrombus.  His abdominal and pelvic CTA shows significant aortoiliac and femoral atherosclerosis but I think there is adequate pelvic vasculature to allow right transfemoral insertion.  His left common femoral artery has significant calcific plaque and is nearly completely occluded by think we can probably access this vessel for pigtail catheter insertion above this area.  The patient and his wife were counseled at length regarding treatment alternatives for management of severe symptomatic aortic stenosis. The risks and benefits of surgical intervention has been discussed in detail. Long-term prognosis with medical therapy was discussed. Alternative approaches such as conventional surgical aortic valve replacement, transcatheter aortic valve replacement, and palliative medical therapy were compared and contrasted at length. This discussion was placed in the context of the patient's own specific clinical presentation and past medical history. All of their questions been addressed.   A discussion was held regarding what types of management strategies would be attempted intraoperatively in the event of  life-threatening complications, including whether or not the patient would be considered a candidate for the use of cardiopulmonary bypass and/or conversion to open sternotomy for attempted surgical intervention. The patient has been advised of a variety of complications that might develop including but not limited to risks of death, stroke, paravalvular leak, aortic dissection or other major vascular complications, aortic annulus rupture, device embolization, cardiac rupture or perforation, mitral regurgitation, acute myocardial infarction, arrhythmia, heart block or bradycardia requiring permanent pacemaker placement, congestive heart failure, respiratory failure, renal failure, pneumonia, infection, other late complications related to structural valve deterioration or migration, or other complications that might ultimately cause a temporary or permanent loss of functional independence or other long term morbidity. The patient provides full informed consent for the procedure as described and all questions were answered.    Plan:  The patient will have a second surgical evaluation by Dr. Roxy Manns and if felt to be an acceptable candidate will be scheduled for right transfemoral TAVR.   I spent 80 minutes performing this consultation and > 50% of this time was spent face to face counseling and coordinating the care of this patient's severe symptomatic aortic stenosis.   Gaye Pollack, MD 04/10/2017 1:07 PM

## 2017-04-10 NOTE — Patient Instructions (Signed)
Pre TAVR Physical Therapy evaluation has been rescheduled to today at 2:15 PM. This will be done at [facility] at [address], [place] [address].

## 2017-04-10 NOTE — Therapy (Signed)
[place], [place], [address] Phone: [phone]   Fax:  [phone]  Physical Therapy Evaluation  Patient Details  Name: Evan [name] MRN: [id number] Date of Birth: May 03, 1940 Referring Provider: Dr. Sherren Mocha   Encounter Date: 04/10/2017  PT End of Session - 04/10/17 1409    Visit Number  1    PT Start Time  1410    PT Stop Time  1502    PT Time Calculation (min)  52 min       Past Medical History:  Diagnosis Date  . Anemia   . Carotid arterial disease ([place])   . CHF (congestive heart failure) ([place])   . Coronary artery disease   . HOH (hard of hearing)   . Hyperlipidemia   . Hypertension   . Nocturia   . Obesity   . Osteoarthritis    "BUE; BLE; right knee" (03/23/2017)  . Peripheral vision loss, bilateral    S/P 07/2015  . Permanent atrial fibrillation ([place])   . 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 Severe aortic stenosis   . Sinus drainage   . Stroke [facility]) 07/2015   "peripheral vision still bad out of left eye" (03/23/2017)  . Torn rotator cuff 2012   Right arm  . Type II diabetes mellitus ([place])   . Urinary frequency     Past Surgical History:  Procedure Laterality Date  . BACK SURGERY    . CARDIAC CATHETERIZATION  11/29/2005   SEVERE THREE VESSEL OBSTRUCTIVE ATHERSCLEROTIC CAD. ALL GRAFTS WERE PATENT. EF 65%  . CARDIAC CATHETERIZATION  03/23/2017  . CAROTID ENDARTERECTOMY Right 01-29-08   cea  . CARPAL TUNNEL RELEASE Right   . CORONARY ARTERY BYPASS GRAFT  03/2003   X3. LIMA GRAFT TO THE LAD, SAPHENOUS VEIN GRAFT TO THE PA, AND A LEFT RADIAL GRAFT TO THEOBTUSE MARGINAL VESSEL  . ENDARTERECTOMY Left 05/28/2012   Procedure: ENDARTERECTOMY CAROTID;  Surgeon: Elam Dutch, MD;  Location: [place];  Service: Vascular;  Laterality: Left;  . INSERT / REPLACE / REMOVE PACEMAKER  03/23/2017  . [place]; 1984  . MOLE SURGERY     "? face/arms"  .  PACEMAKER IMPLANT N/A 03/23/2017   Procedure: PACEMAKER IMPLANT;  Surgeon: Constance Haw, MD;  Location: [facility];  Service: Cardiovascular;  Laterality: N/A;  . PATCH ANGIOPLASTY Left 05/28/2012   Procedure: WITH dACRON PATCH ANGIOPLASTY;  Surgeon: Elam Dutch, MD;  Location: [place];  Service: Vascular;  Laterality: Left;  . RIGHT/LEFT HEART CATH AND CORONARY/GRAFT ANGIOGRAPHY N/A 03/23/2017   Procedure: RIGHT/LEFT HEART CATH AND CORONARY/GRAFT ANGIOGRAPHY;  Surgeon: [place]ren Mocha, MD;  Location: [place] [facility];  Service: Cardiovascular;  Laterality: N/A;    There were no vitals filed for this visit.   Subjective Assessment - 04/10/17 1414    Subjective  Pt/wife report progressive shortness of breath over the past 6-12 months with getting dressed or walking outside the house. Also ackowledges some tightness more so in stomach when taking a shower.     Pertinent History  Pacemaker placed March 2019, FU with MD 3/28; CABG 2005, CVA 2017    Patient Stated Goals  to fix heart, improve ability to dress and walk    Currently in Pain?  No/denies         [facility] PT Assessment - 04/10/17 0001      Assessment   Medical Diagnosis  severe aortic stenosis  Referring Provider  Dr. Sherren Mocha    Onset Date/Surgical Date  -- approximately 6-12 months ago      Precautions   Precautions  ICD/Pacemaker      Restrictions   Weight Bearing Restrictions  No      Balance Screen   Has the patient fallen in the past 6 months  No    Has the patient had a decrease in activity level because of a fear of falling?   No    Is the patient reluctant to leave their home because of a fear of falling?   No      Home Film/video [employer] residence    Living Arrangements  Spouse/significant other    [place] to enter    Entrance Stairs-Number of Steps  3    Entrance Stairs-Rails  None    Home Layout  Two level;Able to live on main level with  bedroom/bathroom    Home Equipment  Shower seat      Prior Function   Level of Independence  Independent with community mobility without device uses walking stick for comfort      Posture/Postural Control   Posture/Postural Control  Postural limitations    Postural Limitations  Rounded Shoulders;Forward head moderate      ROM / Strength   AROM / PROM / Strength  AROM;Strength      AROM   Overall AROM Comments  L UE shoulder limited d/t pacemaker precautions, otherwise WNL      Strength   Overall Strength Comments  RUE shoulder 4-/5, elbow flex bil. 4/5, hip flexion 4-/5 througout, knee ext and ankle DF 4/5    Strength Assessment Site  Hand    Right/Left hand  Right;Left    Right Hand Grip (lbs)  76 R hand dominant    Left Hand Grip (lbs)  60      Ambulation/Gait   Gait Comments  Pt ambulates with mild unsteadiness, wide BOS. Gait distance limited by 67% for age/gender.       6 Minute walk- Post Test   BP (mmHg)  179/73 recovered to 154/70 after 5 minutes       [facility] Pre-Surgical Assessment - 04/10/17 0001    5 Meter Walk Test- trial 1  8 sec    5 Meter Walk Test- trial 2  8 sec.     5 Meter Walk Test- trial 3  8 sec.    5 meter walk test average  8 sec    4 Stage Balance Test tolerated for:   10 sec.    4 Stage Balance Test Position  2    Comment  able without UE support but unable for 5 reps    ADL/IADL Independent with:  Bathing;Dressing;Meal prep;Finances    ADL/IADL Needs Assistance with:  [place] work    ADL/IADL Fraility Index  Vulnerable    6 Minute Walk- Baseline  yes    BP (mmHg)  128/57    HR (bpm)  67    02 Sat (%RA)  93 %    Modified Borg Scale for Dyspnea  0- Nothing at all    Perceived Rate of Exertion (Borg)  6-    6 Minute Walk Post Test  yes    HR (bpm)  99    02 Sat (%RA)  89 % quickly recovers to 91% with rest    Modified Borg Scale for Dyspnea  2- Mild shortness of breath  Perceived Rate of Exertion (Borg)  13- Somewhat hard    Aerobic Endurance  Distance Walked  555           Objective measurements completed on examination: See above findings.              PT Education - 04/10/17 1437    Education provided  Yes    Education Details  fall risk    Person(s) Educated  Patient;Spouse    Methods  Explanation;Handout    Comprehension  Verbalized understanding                  Plan - 04/10/17 1454    Clinical Impression Statement  see below    PT Frequency  One time visit      Clinical Impression Statement: Pt is a 77 yo male presenting to OP PT for evaluation prior to possible TAVR surgery due to severe aortic stenosis. Pt reports onset of progressive shortness of breath with dressing and walking approximately 6-12 months ago. Pt presents with good ROM and fair strength, poor balance and is at high fall risk 4 stage balance test, slow walking speed and poor aerobic endurance per 6 minute walk test. Pt ambulated 370 feet in 3:00 before requesting a seated rest beak lasting 1:30. At time of rest, patient's HR was 97 bpm and O2 was 89 on room air. Pt ambulated a total of 555 feet in 6 minute walk. BP increased significantly (from 128/57 to 173/73 and recovered in 5:00 to 154/70) with 6 minute walk test. Based on the Short Physical Performance Battery, patient has a frailty rating of 4/12 with </= 5/12 considered frail.   Patient demonstrated the following deficits and impairments:     Visit Diagnosis: Other abnormalities of gait and mobility  Abnormal posture     Problem List Patient Active Problem List   Diagnosis Date Noted  . Severe aortic stenosis 03/23/2017  . Atrial fibrillation, permanent ([place]) 03/23/2017  . Peripheral vision loss, bilateral   . Osteoarthritis   . Hypertension   . HOH (hard of hearing)   . Heart murmur   . Frequency of urination   . Dyspnea   . Bruises easily   . Essential hypertension 10/15/2015  . 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 Diarrhea 08/24/2015  . Stroke ([place]) 07/25/2015  . Edema of leg 03/20/2015  . Bradycardia 03/20/2015  . Nocturia 10/24/2014  . Atrial fibrillation ([place]) 02/20/2013  . Aftercare following surgery of the circulatory system, [place] 06/07/2012  . Occlusion and stenosis of carotid artery without mention of cerebral infarction 04/14/2011  . Shoulder pain 04/14/2011  . 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 Diabetes mellitus ([place]) 02/01/2011  . Coronary artery disease   . Hyperlipidemia   . Hypertensive urgency   . Obesity   . Carotid arterial disease ([place])   . Torn rotator cuff 01/24/2010    NICOLETTA,DANA, PT 04/10/2017, 3:03 PM  [facility] [address] [place], [place], [address] Phone: [phone]   Fax:  [phone]  Name: Evan [name] MRN: [id number] Date of Birth: 01-24-1941

## 2017-04-12 ENCOUNTER — Encounter: Payer: Medicare Other | Admitting: Surgery

## 2017-04-12 ENCOUNTER — Ambulatory Visit: Payer: Medicare Other | Admitting: Physical Therapy

## 2017-04-13 ENCOUNTER — Other Ambulatory Visit: Payer: Self-pay

## 2017-04-17 ENCOUNTER — Other Ambulatory Visit: Payer: Self-pay

## 2017-04-17 ENCOUNTER — Institutional Professional Consult (permissible substitution) (INDEPENDENT_AMBULATORY_CARE_PROVIDER_SITE_OTHER): Payer: Medicare Other | Admitting: Thoracic Surgery (Cardiothoracic Vascular Surgery)

## 2017-04-17 ENCOUNTER — Ambulatory Visit
Admission: RE | Admit: 2017-04-17 | Discharge: 2017-04-17 | Disposition: A | Discharge status: Home / Self Care | Payer: Medicare Other | Source: Ambulatory Visit | Attending: Thoracic Surgery (Cardiothoracic Vascular Surgery) | Admitting: Thoracic Surgery (Cardiothoracic Vascular Surgery)

## 2017-04-17 ENCOUNTER — Encounter: Payer: Self-pay | Admitting: Thoracic Surgery (Cardiothoracic Vascular Surgery)

## 2017-04-17 ENCOUNTER — Other Ambulatory Visit: Payer: Self-pay | Admitting: *Deleted

## 2017-04-17 VITALS — BP 114/63 | HR 73 | Temp 97.2°F | Resp 20 | Ht 72.0 in | Wt 268.0 lb

## 2017-04-17 DIAGNOSIS — I350 Nonrheumatic aortic (valve) stenosis: Secondary | ICD-10-CM

## 2017-04-17 DIAGNOSIS — I6523 Occlusion and stenosis of bilateral carotid arteries: Secondary | ICD-10-CM | POA: Diagnosis not present

## 2017-04-17 DIAGNOSIS — J189 Pneumonia, unspecified organism: Secondary | ICD-10-CM

## 2017-04-17 DIAGNOSIS — J40 Bronchitis, not specified as acute or chronic: Secondary | ICD-10-CM | POA: Insufficient documentation

## 2017-04-17 MED ORDER — DOXYCYCLINE HYCLATE 100 MG PO CAPS: 100.0000 mg | ORAL_CAPSULE | Freq: Two times a day (BID) | ORAL | 0 refills | Status: DC

## 2017-04-17 NOTE — Progress Notes (Addendum)
HEART AND [place] SURGERY CONSULTATION REPORT  Referring Provider is [name], [name] M, MD PCP is Leonel Ramsay, MD  Chief Complaint  Patient presents with  . 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  The patient's cardiac history dates back to 2005 when he presented with symptomatic ischemic heart disease.  He underwent coronary artery bypass grafting at [facility].  He reportedly recovered reasonably well.  He developed atrial fibrillation which has been long-standing persistent and failed cardioversion.  He has also developed trifascicular block with bradycardia.  Echocardiograms performed over the last few years has developed progressive aortic stenosis.  He developed worsening symptoms of exertional shortness of breath without chest pain or chest tightness.  Follow-up echocardiogram performed March 01, 2017 revealed revealed normal left ventricular systolic function with ejection fraction estimated 55-60%.  Peak velocity across the aortic valve measured at size 4.3 m/s corresponding to mean transvalvular gradient estimated 35 mmHg.  The DVI was reported quite low at 0.19.  He was referred to Dr. Burt Knack and underwent left and right heart catheterization on March 23, 2017.  Findings at the time of catheterization revealed severe  native three-vessel coronary artery disease with chronic total occlusion of the right coronary artery and the left anterior descending coronary artery.  There was severe left main stenosis and high-grade stenosis of the left circumflex coronary artery.  There was continued patency of left internal mammary artery graft to the left anterior descending coronary artery and patency of a very small free radial graft to the obtuse marginal branch.  There was diffuse anastomotic stenosis of this graft involving a small caliber target vessel.  There was continued patency of the vein graft to the posterior descending coronary artery without any associated significant disease.  T catheterization confirmed the presence of severe aortic stenosis with mean transvalvular gradient measured 40 mmHg.  There was moderate to severe pulmonary hypertension with PA pressures measured 72/25.  He was also found to have symptomatic bradycardia and underwent permanent pacemaker placement March 23, 2017.  Symptoms of shortness of breath improved following pacemaker placement, but they have not resolved.  The patient underwent CT angiography and was referred for surgical consultation.  He has been evaluated previously by Dr. Cyndia Bent, and transcatheter aortic valve replacement has been planned.  The patient presents to the office today for second surgical opinion.  The patient is married and lives with his wife in [place] on a farm.  He has been retired from [employer] since 2001.  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  He describes stable symptoms of exertional shortness of breath that occur with moderate and low-level activity.  He denies any recent history of resting shortness of breath, PND, orthopnea, dizzy spells, or syncope.  He has never had any  chest pain or  chest tightness either with activity or at rest.  He has chronic swelling of both lower legs.  His mobility is somewhat limited and he admits to some problems with balance.  He uses a walking stick for stability when he is outdoors.  The patient states that several days ago he again to develop flulike illness notable for increased productive cough, generalized malaise, and low-grade fever to 100 F.  His appetite is reduced and he feels chronic.  He has not had dysuria.  Past Medical History:  Diagnosis Date  . Anemia   . Carotid arterial disease ([place])   . CHF (congestive heart failure) ([place])   . Coronary artery disease   . HOH (hard of hearing)   . Hyperlipidemia   . Hypertension   . Nocturia   . Obesity   . Osteoarthritis    "BUE; BLE; right knee" (03/23/2017)  . Peripheral vision loss, bilateral    S/P 07/2015  . Permanent atrial fibrillation ([place])   . 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 Severe aortic stenosis   . Sinus drainage   . Stroke [facility]) 07/2015   "peripheral vision still bad out of left eye" (03/23/2017)  . Torn rotator cuff 2012   Right arm  . Type II diabetes mellitus ([place])   . Urinary frequency     Past Surgical History:  Procedure Laterality Date  . BACK SURGERY    . CARDIAC CATHETERIZATION  11/29/2005   SEVERE THREE VESSEL OBSTRUCTIVE ATHERSCLEROTIC CAD. ALL GRAFTS WERE PATENT. EF 65%  . CARDIAC CATHETERIZATION  03/23/2017  . CAROTID ENDARTERECTOMY Right 01-29-08   cea  . CARPAL TUNNEL RELEASE Right   . CORONARY ARTERY BYPASS GRAFT  03/2003   X3. LIMA GRAFT TO THE LAD, SAPHENOUS VEIN GRAFT TO THE PA, AND A LEFT RADIAL GRAFT TO THEOBTUSE MARGINAL VESSEL  . ENDARTERECTOMY Left 05/28/2012   Procedure: ENDARTERECTOMY CAROTID;  Surgeon: Elam Dutch, MD;  Location: [facility];  Service: Vascular;  Laterality: Left;  . INSERT / REPLACE / REMOVE PACEMAKER  03/23/2017  . [place]; 1984  . MOLE SURGERY     "? face/arms"  . PACEMAKER  IMPLANT N/A 03/23/2017   Procedure: PACEMAKER IMPLANT;  Surgeon: Constance Haw, MD;  Location: [place] CV LAB;  Service: Cardiovascular;  Laterality: N/A;  . PATCH ANGIOPLASTY Left 05/28/2012   Procedure: WITH dACRON PATCH ANGIOPLASTY;  Surgeon: Elam Dutch, MD;  Location: [place];  Service: Vascular;  Laterality: Left;  . RIGHT/LEFT HEART CATH AND CORONARY/GRAFT ANGIOGRAPHY N/A 03/23/2017   Procedure: RIGHT/LEFT HEART CATH AND CORONARY/GRAFT ANGIOGRAPHY;  Surgeon: Sherren Mocha, MD;  Location: [place] [facility];  Service: Cardiovascular;  Laterality: N/A;    Family History  Problem Relation Age of Onset  . Heart attack Father   . Heart attack Brother   . Parkinsonism Brother   . Cancer Brother   . Arrhythmia Brother     Social History   Socioeconomic History  . Marital status: Married    Spouse name: Not on file  . Number of children: 5  . Years of education: Not on file  . Highest education level: Not on file  Occupational History  . Occupation: retired    Fish farm manager: RETIRED  Social Needs  . Financial resource strain: Not on file  . Food insecurity:    Worry: Not on file    Inability: Not on file  . Transportation needs:    Medical:  Not on file    Non-medical: Not on file  Tobacco Use  . Smoking status: Former Smoker    Packs/day: 2.00    Years: 39.00    Pack years: 78.00    Types: Cigarettes    Last attempt to quit: 01/24/1994    Years since quitting: 23.2  . Smokeless tobacco: Never Used  Substance and Sexual Activity  . Alcohol use: No  . Drug use: No  . Sexual activity: Never  Lifestyle  . Physical activity:    Days per week: Not on file    Minutes per session: Not on file  . Stress: Not on file  Relationships  . Social connections:    Talks on phone: Not on file    Gets together: Not on file    Attends religious service: Not on file    Active member of club or organization: Not on file    Attends meetings of clubs or organizations: Not on  file    Relationship status: Not on file  . Intimate partner violence:    Fear of current or ex partner: Not on file    Emotionally abused: Not on file    Physically abused: Not on file    Forced sexual activity: Not on file  Other Topics Concern  . Not on file  Social History Narrative  . Not on file    Current Outpatient Medications  Medication Sig Dispense Refill  . acetaminophen (TYLENOL) 650 MG CR tablet Take 1,300 mg by mouth every 8 (eight) hours as needed for pain.    Marland Kitchen amLODipine (NORVASC) 2.5 MG tablet Take 2.5 mg by mouth daily.    Marland Kitchen apixaban (ELIQUIS) 5 MG TABS tablet Take 1 tablet (5 mg total) by mouth 2 (two) times daily. 28 tablet 0  . benazepril (LOTENSIN) 40 MG tablet Take 1 tablet (40 mg total) by mouth daily. 90 tablet 3  . Camphor-Menthol-Methyl Sal (MUSCLE RUB ULTRA STRENGTH) 05-03-28 % CREA Apply 1 application topically 3 (three) times daily as needed (muscle pain).    . Cyanocobalamin (RA VITAMIN B-12 TR) 1000 MCG TBCR Take 1,000 mcg by mouth daily.     Marland Kitchen ezetimibe (ZETIA) 10 MG tablet Take 1 tablet (10 mg total) by mouth daily. (Patient taking differently: Take 10 mg by mouth every evening. ) 30 tablet 11  . fluticasone (FLONASE) 50 MCG/ACT nasal spray Place 2 sprays into both nostrils daily.    Marland Kitchen glimepiride (AMARYL) 2 MG tablet Take 2 mg by mouth daily with breakfast.    . guaiFENesin-codeine 100-10 MG/5ML syrup Take by mouth.    . hydrochlorothiazide (HYDRODIURIL) 25 MG tablet Take 1 tablet (25 mg total) by mouth every morning. 90 tablet 1  . OVER THE COUNTER MEDICATION Replenex tablets: Take 3 tablet by mouth every morning (joint health)    . Polyvinyl Alcohol-Povidone (REFRESH OP) Place 2 drops into both eyes daily as needed (for dry eyes).     . rosuvastatin (CRESTOR) 20 MG tablet Take 1 tablet (20 mg total) by mouth daily. (Patient taking differently: Take 20 mg by mouth every evening. ) 30 tablet 0  . tamsulosin (FLOMAX) 0.4 MG CAPS capsule TAKE 1 CAPSULE  BY MOUTH ONCE DAILY (Patient taking differently: TAKE 0.4 MG BY MOUTH ONCE DAILY IN THE EVENING) 90 capsule 3   No current facility-administered medications for this visit.     Allergies  Allergen Reactions  . Niacin And Related Rash and Other (See Comments)    Red face  and burns.  . Metformin And Related Diarrhea  . Statins Other (See Comments)    Myalgias, weakness      Review of Systems:   General:  normal appetite, + decreased energy, no weight gain, no weight loss, + low grade fever, + malaise  Cardiac:  no chest pain with exertion, no chest pain at rest, + SOB with exertion, no resting SOB, no PND, no orthopnea, no palpitations, + arrhythmia, + atrial fibrillation, + LE edema, no dizzy spells, no syncope  Respiratory:  + shortness of breath, no home oxygen, + productive cough, no dry cough, no bronchitis, no wheezing, no hemoptysis, no asthma, no pain with inspiration or cough, no sleep apnea, no CPAP at night  GI:   no difficulty swallowing, no reflux, no frequent heartburn, no hiatal hernia, no abdominal pain, no constipation, no diarrhea, no hematochezia, no hematemesis, no melena  GU:   no dysuria,  + frequency, no urinary tract infection, no hematuria, no enlarged prostate, no kidney stones, no kidney disease  Vascular:  + pain suggestive of claudication, + pain in feet, no leg cramps, no varicose veins, no DVT, no non-healing foot ulcer  Neuro:   + stroke, no TIA's, no seizures, no headaches, no temporary blindness one eye,  no slurred speech, no peripheral neuropathy, no chronic pain, mild instability of gait, no memory/cognitive dysfunction  Musculoskeletal: + arthritis, no joint swelling, no myalgias, minor difficulty walking, decreased mobility   Skin:   no rash, no itching, no skin infections, no pressure sores or ulcerations  Psych:   no anxiety, + depression, no nervousness, no unusual recent stress  Eyes:   no blurry vision, no floaters, + recent vision changes, +  wears glasses or contacts  ENT:   + hearing loss, no loose or painful teeth, + dentures  Hematologic:  + easy bruising, no abnormal bleeding, no clotting disorder, no frequent epistaxis  Endocrine:  + diabetes, does check CBG's at home           Physical Exam:   BP 114/63 (BP Location: Left Arm, Patient Position: Sitting, Cuff Size: Large)   Pulse 73   Temp (!) 97.2 F (36.2 C)   Resp 20   Ht 6' (1.829 m)   Wt 268 lb (121.6 kg)   SpO2 93% Comment: RA  BMI 36.35 kg/m   General:  Obese elderly male NAD    HEENT:  Unremarkable   Neck:   no JVD, no bruits, no adenopathy   Chest:   Coarse rhonchi both lung fields, breath sounds symmetrical, no wheezes  CV:   Irregular rate and rhythm, grade IV/VI crescendo/decrescendo murmur heard best at RSB,  no diastolic murmur  Abdomen:  soft, non-tender, no masses   Extremities:  warm, well-perfused, pulses not palpable, + mild LE edema  Rectal/GU  Deferred  Neuro:   Grossly non-focal and symmetrical throughout  Skin:   Clean and dry, no rashes, no breakdown   Diagnostic Tests:  Transthoracic Echocardiography  Patient: Kristi, Hyer MR #: [id number] Study Date: 03/01/2017 Gender: M Age: 66 Height: 182.9 cm Weight: 121.6 kg BSA: 2.53 m^2 Pt. Status: Room:  SONOGRAPHER Diamond Nickel ATTENDING Loralie Champagne, M.D. PERFORMING [facility], Outpatient ORDERING Will Camnitz, MD [place], MD  cc:  ------------------------------------------------------------------- LV EF: 55% - 60%  ------------------------------------------------------------------- Indications: I48.2 Chronic atrial fibrillation. I35.0 Non-rheumatic aortic valve stenosis.  ------------------------------------------------------------------- History: PMH: Obesity. Dyspnea and murmur. Coronary artery disease. Stroke. Risk factors: Hypertension. Diabetes  mellitus. Dyslipidemia.  -------------------------------------------------------------------  Study Conclusions  - Left ventricle: The cavity size was normal. Wall thickness was increased in a pattern of severe LVH. Systolic function was normal. The estimated ejection fraction was in the range of 55% to 60%. Indeterminant diastolic function (atrial fibrillation). Wall motion was normal; there were no regional wall motion abnormalities. - Aortic valve: Probably trileaflet; severely calcified leaflets. There was severe stenosis. Mean gradient (S): 38 mm Hg. Valve area (VTI): 0.64 cm^2. - Mitral valve: Moderately calcified annulus. There was trivial regurgitation. - Left atrium: The atrium was severely dilated. - Right ventricle: The cavity size was normal. Systolic function was mildly to moderately reduced. - Right atrium: The atrium was mildly dilated. - Pulmonary arteries: No complete TR doppler jet so unable to estimate PA systolic pressure. - Systemic veins: IVC measured 2.3 cm with < 50% respirophasic variation, suggesting RA pressure 15 mmHg.  Impressions:  - The patient was in atrial fibrillation. Normal LV size with severe LV hypertrophy. EF 55-60%. Normal RV size with mild to moderately decreased systolic function. Aortic stenosis appeared severe. Mean gradient was not quite to severe range but visually and by calculated AVA, there was severe aortic stenosis.  ------------------------------------------------------------------- Study data: Comparison was made to the study of 08/25/2015. Study status: Routine. Procedure: The patient reported no pain pre or post test. Transthoracic echocardiography. Image quality was adequate. The study was technically difficult, as a result of poor sound wave transmission and body habitus. Intravenous contrast (Definity) was administered. Study completion: There were no complications.  Transthoracic echocardiography. M-mode, complete 2D, spectral Doppler, and color Doppler. Birthdate: Patient birthdate: Oct 20, 1940. Age: Patient is 77 yr old. Sex: Gender: male. BMI: 36.3 kg/m^2. Blood pressure: 174/78 Patient status: Outpatient. Study date: Study date: 03/01/2017. Study time: 10:50 AM. Location: [facility]  -------------------------------------------------------------------  ------------------------------------------------------------------- Left ventricle: The cavity size was normal. Wall thickness was increased in a pattern of severe LVH. Systolic function was normal. The estimated ejection fraction was in the range of 55% to 60%. Indeterminant diastolic function (atrial fibrillation). Wall motion was normal; there were no regional wall motion abnormalities.  ------------------------------------------------------------------- Aortic valve: Probably trileaflet; severely calcified leaflets. Doppler: There was severe stenosis. There was no regurgitation. VTI ratio of LVOT to aortic valve: 0.2. Valve area (VTI): 0.64 cm^2. Indexed valve area (VTI): 0.25 cm^2/m^2. Peak velocity ratio of LVOT to aortic valve: 0.19. Valve area (Vmax): 0.6 cm^2. Indexed valve area (Vmax): 0.24 cm^2/m^2. Mean velocity ratio of LVOT to aortic valve: 0.2. Valve area (Vmean): 0.63 cm^2. Indexed valve area (Vmean): 0.25 cm^2/m^2. Mean gradient (S): 38 mm Hg. Peak gradient (S): 73 mm Hg.  ------------------------------------------------------------------- Aorta: Aortic root: The aortic root was normal in size. Ascending aorta: The ascending aorta was normal in size.  ------------------------------------------------------------------- Mitral valve: Moderately calcified annulus. Doppler: There was no evidence for stenosis. There was trivial regurgitation.  ------------------------------------------------------------------- Left  atrium: The atrium was severely dilated.  ------------------------------------------------------------------- Right ventricle: The cavity size was normal. Systolic function was mildly to moderately reduced.  ------------------------------------------------------------------- Pulmonic valve: Structurally normal valve. Cusp separation was normal. Doppler: Transvalvular velocity was within the normal range. There was no regurgitation.  ------------------------------------------------------------------- Tricuspid valve: Doppler: There was no significant regurgitation.  ------------------------------------------------------------------- Pulmonary artery: No complete TR doppler jet so unable to estimate PA systolic pressure.  ------------------------------------------------------------------- Right atrium: The atrium was mildly dilated.  ------------------------------------------------------------------- Pericardium: There was no pericardial effusion.  ------------------------------------------------------------------- Systemic veins: IVC measured 2.3 cm with <50% respirophasic variation, suggesting RA pressure 15 mmHg.  ------------------------------------------------------------------- Measurements  Left ventricle Value Reference LV ID, ED, PLAX chordal 45 mm 43 - 52 LV ID, ES, PLAX chordal 34 mm 23 - 38 LV fx shortening, PLAX chordal (L) 24 % >=29 LV PW thickness, ED 17 mm ---------- IVS/LV PW ratio, ED 1.24 <=1.3 Stroke volume, 2D 64 ml ---------- Stroke volume/bsa, 2D 25 ml/m^2 ----------  Ventricular septum Value Reference IVS thickness, ED 21 mm  ----------  LVOT Value Reference LVOT ID, S 20 mm ---------- LVOT area 3.14 cm^2 ---------- LVOT peak velocity, S 81.5 cm/s ---------- LVOT mean velocity, S 54.8 cm/s ---------- LVOT VTI, S 20.3 cm ----------  Aortic valve Value Reference Aortic valve peak velocity, S 427 cm/s ---------- Aortic valve mean velocity, S 271 cm/s ---------- Aortic valve VTI, S 99.7 cm ---------- Aortic mean gradient, S 35 mm Hg ---------- Aortic peak gradient, S 73 mm Hg ---------- VTI ratio, LVOT/AV 0.2 ---------- Aortic valve area, VTI 0.64 cm^2 ---------- Aortic valve area/bsa, VTI 0.25 cm^2/m^2 ---------- Velocity ratio, peak, LVOT/AV 0.19 ---------- Aortic valve area, peak velocity 0.6 cm^2 ---------- Aortic valve area/bsa, peak 0.24 cm^2/m^2 ---------- velocity Velocity ratio, mean, LVOT/AV 0.2 ---------- Aortic valve area, mean velocity 0.63 cm^2 ---------- Aortic valve area/bsa, mean 0.25 cm^2/m^2 ---------- velocity  Aorta Value Reference Aortic root ID, ED 34 mm ----------  Left atrium Value Reference LA ID, A-P, ES 51 mm ---------- LA ID/bsa, A-P 2.02 cm/m^2 <=2.2 LA volume, S 133 ml ---------- LA volume/bsa, S  52.6 ml/m^2 ---------- LA volume, ES, 1-p A4C 144 ml ---------- LA volume/bsa, ES, 1-p A4C 56.9 ml/m^2 ---------- LA volume, ES, 1-p A2C 119 ml ---------- LA volume/bsa, ES, 1-p A2C 47.1 ml/m^2 ----------  Right atrium Value Reference RA ID, S-I, ES, A4C (H) 56.6 mm 34 - 49 RA area, ES, A4C (H) 21.3 cm^2 8.3 - 19.5 RA volume, ES, A/L 68.5 ml ---------- RA volume/bsa, ES, A/L 27.1 ml/m^2 ----------  Legend: (L) and (H) mark values outside specified reference range.  ------------------------------------------------------------------- Prepared and Electronically Authenticated by  Loralie Champagne, M.D. [id number]:51:49   RIGHT/LEFT HEART CATH AND CORONARY/GRAFT ANGIOGRAPHY  Conclusion     Mid RCA lesion is 100% stenosed.  Ost LM to Dist LM lesion is 70% stenosed.  Prox Cx lesion is 90% stenosed.  Ost 2nd Mrg lesion is 100% stenosed.  Prox LAD-1 lesion is 90% stenosed.  Prox LAD-2 lesion is 100% stenosed.  LIMA and is normal in caliber.  The graft exhibits no disease.  SVG graft was visualized by angiography and is normal in caliber.  Left radial artery graft was visualized by angiography.  1. Severe native three-vessel coronary artery disease with total occlusion of the RCA, severe left main stenosis, total occlusion of the LAD with severe calcification, and severe stenosis of the circumflex and its branch vessels.  2. Status post aortocoronary bypass surgery with continued patency of the LIMA to LAD, continued patency of the free radial graft to OM with tight stenosis at the distal anastomosis involving a small caliber vessel, and continued patency of the saphenous vein graft to PDA without any  significant disease.  3. Severe aortic valve stenosis with a mean gradient of 40 mmHg. The calculated valve area is 1.74 cm likely related to high cardiac output  4. Moderate pulmonary hypertension with a PA pressure of 72/25 with a mean of 38   Recommendations: The patient will undergo permanent pacemaker placement for symptomatic atrial fibrillation with slow ventricular rate. He will continue to undergo evaluation by the multidisciplinary heart team  for consideration of treatment options for his severe aortic stenosis.   Indications   Severe aortic stenosis [I35.0 (ICD-10-CM)]  Procedural Details/Technique   Technical Details INDICATION: Severe aortic stenosis, chronic diastolic CHF  PROCEDURAL DETAILS: The right groin was prepped, draped, and anesthetized with 1% lidocaine. Using the modified Seldinger technique a 5 French sheath was placed in the right femoral artery and a 7 French sheath was placed in the right femoral vein. A Swan-Ganz catheter was used for the right heart catheterization. Standard protocol was followed for recording of right heart pressures and sampling of oxygen saturations. The Swan-Ganz catheter would not advance into the pulmonary capillary wedge position. Fick cardiac output was calculated. Standard Judkins catheters were used for selective coronary angiography. It was difficult to manipulate catheters because of iliac tortuosity. The 11 cm sheath was changed out for a 25 cm sheath. For the native coronaries, a JR4 and JL 5 catheter were used. For the bypass grafts, a JR4 was used for the free radial graft, a LIMA catheter was used for the LIMA graft, and a multipurpose catheter was used for the saphenous vein to PDA graft. An AL-1 catheter and straight tip wire were used to cross the aortic valve and recorded pullback gradient. There were no immediate procedural complications. The patient was transferred to the post catheterization recovery area for further  monitoring.    Estimated blood loss <50 mL.  During this procedure the patient was administered the following to achieve and maintain moderate conscious sedation: Versed 2 mg, Fentanyl 25 mcg, while the patient's heart rate, blood pressure, and oxygen saturation were continuously monitored. The period of conscious sedation was 46 minutes, of which I was present face-to-face 100% of this time.  Coronary Findings   Diagnostic  Dominance: Right  Left Main  Ost LM to Dist LM lesion 70% stenosed  Ost LM to Dist LM lesion is 70% stenosed. The lesion is severely calcified. The left mainstem is heavily calcified with diffuse 70% stenosis  Left Anterior Descending  Prox LAD-1 lesion 90% stenosed  Prox LAD-1 lesion is 90% stenosed. The lesion is calcified. The proximal LAD has 90% severely calcific stenosis. The first diagonal branch is patent. The LAD occludes prior to the second diagonal origin  Prox LAD-2 lesion 100% stenosed  Prox LAD-2 lesion is 100% stenosed. The lesion is severely calcified.  Left Circumflex  Prox Cx lesion 90% stenosed  Prox Cx lesion is 90% stenosed. The lesion is severely calcified. The circumflex is severely diseased. There is 90% complex calcified stenosis at the bifurcation of the AV circumflex and first obtuse marginal branch. The obtuse marginal branch is diffusely diseased and then divides into twin vessels.  Second Obtuse Marginal Branch  Ost 2nd Mrg lesion 100% stenosed  Ost 2nd Mrg lesion is 100% stenosed. The second OM branch is severely calcified. The vessel is subtotally occluded and fills late, dividing into twin vessels distally.  Right Coronary Artery  Mid RCA lesion 100% stenosed  Mid RCA lesion is 100% stenosed. The lesion is severely calcified. This is a severely diseased vessel throughout its proximal portion which then occludes in the mid vessel. The distal vessel fills from the saphenous vein graft.  Free LIMA Graft to Mid LAD  LIMA and is normal in  caliber. The graft exhibits no disease. The LIMA to LAD is widely patent without significant stenosis.  saphenous Graft to RPDA  SVG graft was visualized by angiography and is normal in caliber. The saphenous vein graft  to PDA is widely patent without significant stenosis. This fills the PDA and a large PLA branch and also supplies some collaterals to the left circumflex distribution.  Left Radial Artery Graft to 1st Mrg  Left radial artery graft was visualized by angiography. The free left radial graft to the obtuse marginal branch is small throughout. The graft has a tight stenosis at the distal coronary anastomotic site. The target vessel appears small.  Intervention   No interventions have been documented.  Coronary Diagrams   Diagnostic Diagram       Implants        No implant documentation for this case.  MERGE Images   Show images for CARDIAC CATHETERIZATION   Link to Procedure Log   Procedure Log    Hemo Data    Most Recent Value  Fick Cardiac Output 8.58 L/min  Fick Cardiac Output Index 3.55 (L/min)/BSA  Aortic Mean Gradient 40.2 mmHg  Aortic Peak Gradient 37 mmHg  Aortic Valve Area 1.74  Aortic Value Area Index 0.72 cm2/BSA  RA A Wave -99 mmHg  RA V Wave 14 mmHg  RA Mean 12 mmHg  RV Systolic Pressure 69 mmHg  RV Diastolic Pressure 4 mmHg  RV EDP 10 mmHg  PA Systolic Pressure 72 mmHg  PA Diastolic Pressure 25 mmHg  PA Mean 38 mmHg  AO Systolic Pressure 578 mmHg  AO Diastolic Pressure 69 mmHg  AO Mean 469 mmHg  LV Systolic Pressure 629 mmHg  LV Diastolic Pressure 13 mmHg  LV EDP 21 mmHg  Arterial Occlusion Pressure Extended Systolic Pressure 528 mmHg  Arterial Occlusion Pressure Extended Diastolic Pressure 73 mmHg  Arterial Occlusion Pressure Extended Mean Pressure 114 mmHg  Left Ventricular Apex Extended Systolic Pressure 413 mmHg  Left Ventricular Apex Extended Diastolic Pressure 12 mmHg  Left Ventricular Apex Extended EDP Pressure 20 mmHg   QP/QS 1  TPVR Index 10.73 HRUI  TSVR Index 29.35 HRUI  TPVR/TSVR Ratio 0.37     Cardiac TAVR CT  TECHNIQUE: The patient was scanned on a [employer] scanner. A 120 kV retrospective scan was triggered in the descending thoracic aorta at 111 HU's. Gantry rotation speed was 270 msecs and collimation was .9 mm. No beta blockade or nitro were given. The 3D data set was reconstructed in 5% intervals of the R-R cycle. Systolic and diastolic phases were analyzed on a dedicated work station using MPR, MIP and VRT modes. The patient received 80 cc of contrast.  FINDINGS: Aortic Valve: Tri leaflet and severely calcified with restricted motion  Aorta: Severe calcific aortic atherosclerosis  Sinotubular Junction: 30 mm  Ascending Thoracic Aorta: 34 mm  Aortic Arch: 31 mm  Descending Thoracic Aorta: 26 mm  Sinus of Valsalva Measurements:  Non-coronary: 30.8 mm  Right -coronary: 31.4 mm  Left -coronary: 31.4 mm  Coronary Artery Height above Annulus:  Left Main: 12.8 mm above annulus  Right Coronary: 15 mm above annulus  Virtual Basal Annulus Measurements:  Maximum/Minimum Diameter: 28.2 mm x 21.9 mm  Perimeter: 80 mm  Area: 476 mm2  Coronary Arteries: Sufficient height above annulus for deployment Patent SVG to RCA Patent free RIMA to OM and patent LIMA to LAD  Optimum Fluoroscopic Angle for Delivery: LAO 16 degrees Caudal 12 degrees  IMPRESSION: 1. Calcified tri leaflet AV with annulus 476 mm2 suitable for a 26 mm Sapien 3 valve  2. Severe calcific aortic atherosclerosis with bovine arch and normal root diameter 3.4 mm  3. Patent SVG to RCA, patent free  RIMA to OM and patent LIMA to LAD  4. Severe LAE with layering of contrast in LAA cannot r/o thrombus  5. Pacing wires in RA/RV  6. Optimum angiographic angle for deployment LAO 16 degrees Caudal 12 degrees  7. Coronary arteries suitable height above annulus for  deployment  Jenkins Rouge   Electronically Signed By: Jenkins Rouge M.D. On: 03/31/2017 13:54    CT ANGIOGRAPHY CHEST, ABDOMEN AND PELVIS  TECHNIQUE: Multidetector CT imaging through the chest, abdomen and pelvis was performed using the standard protocol during bolus administration of intravenous contrast. Multiplanar reconstructed images and MIPs were obtained and reviewed to evaluate the vascular anatomy.  CONTRAST: 80 mL of Isovue 370.  COMPARISON: No priors.  FINDINGS: CTA CHEST FINDINGS  Cardiovascular: Heart size is mildly enlarged. There is no significant pericardial fluid, thickening or pericardial calcification. There is aortic atherosclerosis, as well as atherosclerosis of the great vessels of the mediastinum and the coronary arteries, including calcified atherosclerotic plaque in the left main, left anterior descending, left circumflex and right coronary arteries. Status post median sternotomy for CABG including LIMA to the LAD. Severe dilatation of the pulmonic trunk (4.5 cm), concerning for pulmonary arterial hypertension.  Mediastinum/Lymph Nodes: No pathologically enlarged mediastinal or hilar lymph nodes. Esophagus is unremarkable in appearance. No axillary lymphadenopathy.  Lungs/Pleura: No suspicious appearing pulmonary nodules or masses. No acute consolidative airspace disease. No pleural effusions. Mild diffuse bronchial wall thickening with mild centrilobular and paraseptal emphysema.  Musculoskeletal/Soft Tissues: Median sternotomy wires. There are no aggressive appearing lytic or blastic lesions noted in the visualized portions of the skeleton.  CTA ABDOMEN AND PELVIS FINDINGS  Hepatobiliary: The liver has a shrunken appearance and nodular contour, suggestive of underlying cirrhosis. No definite cystic or solid hepatic lesions. No intra or extrahepatic biliary ductal dilatation. Gallbladder is normal in  appearance.  Pancreas: No pancreatic mass. No pancreatic ductal dilatation. No pancreatic or peripancreatic fluid or inflammatory changes.  Spleen: Unremarkable.  Adrenals/Urinary Tract: 1.8 cm low-attenuation lesion in the lower pole of the right kidney is compatible with a simple cyst. Focal area of cortical thinning in the interpolar region of the right kidney, presumably scarring from prior infection or infarct. No suspicious renal lesions. Left kidney is normal in appearance. Bilateral adrenal glands are normal in appearance. No hydroureteronephrosis. Urinary bladder is normal in appearance.  Stomach/Bowel: Normal appearance of the stomach. No pathologic dilatation of small bowel or colon. Normal appendix.  Vascular/Lymphatic: Aortic atherosclerosis, with vascular findings and measurements pertinent to potential TAVR procedure, as detailed below. Mild stenosis of the proximal celiac axis. In the mid superior mesenteric artery (axial image 119 of series 8 and coronal image 75 of series 10) there is a moderate to severe stenosis. Inferior mesenteric artery appears widely patent without hemodynamically significant stenosis. Single right renal artery widely patent. Two left renal arteries, the largest of which demonstrates mild stenosis at the ostium. No lymphadenopathy noted in the abdomen or pelvis.  Reproductive: Prostate gland and seminal vesicles are unremarkable in appearance.  Other: No significant volume of ascites. No pneumoperitoneum.  Musculoskeletal: There are no aggressive appearing lytic or blastic lesions noted in the visualized portions of the skeleton.  VASCULAR MEASUREMENTS PERTINENT TO TAVR:  AORTA:  Minimal Aortic Diameter-17 x 12 mm  Severity of Aortic Calcification-severe  RIGHT PELVIS:  Right Common Iliac Artery -  Minimal Diameter-9.5 x 8.5 mm  Tortuosity-mild  Calcification-moderate  Right External Iliac Artery  -  Minimal Diameter-7.5 x 7.2 mm  Tortuosity-severe  Calcification-mild  Right Common Femoral Artery -  Minimal Diameter-7.9 x 5.5 mm  Tortuosity-mild  Calcification-moderate  LEFT PELVIS:  Left Common Iliac Artery -  Minimal Diameter-9.9 x 7.9 mm  Tortuosity-mild  Calcification-severe  Left External Iliac Artery -  Minimal Diameter-8.5 x 6.7 mm  Tortuosity-moderate to severe  Calcification-mild  Left Common Femoral Artery -  Minimal Diameter-nearly completely occluded and too small to accurately measure  Tortuosity-mild  Calcification-moderate  Review of the MIP images confirms the above findings.  IMPRESSION: 1. Vascular findings and measurements pertinent to potential TAVR procedure, as detailed above. 2. Severe thickening calcification of the aortic valve, compatible with the reported clinical history of severe aortic stenosis. 3. Aortic atherosclerosis, in addition to left main and 3 vessel coronary artery disease. Status post median sternotomy for CABG including LIMA to the LAD. 4. Severe dilatation of the pulmonic trunk (4.5 cm in diameter), concerning for pulmonary arterial hypertension. 5. Morphologic changes in the liver indicative of mild cirrhosis. 6. 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 7. Additional incidental findings, as above.  Aortic Atherosclerosis (ICD10-I70.0).   Electronically Signed By: Vinnie Langton M.D. On: 03/30/2017 15:21   STS Risk Calculator  Procedure: AVR + CAB CALCULATE  Risk of Mortality:  10.364%   Renal Failure:  10.500%   Permanent Stroke:  3.988%   Prolonged Ventilation:  31.684%   DSW Infection:  0.354%   Reoperation:  4.381%   Morbidity or Mortality:  36.638%   Short Length of Stay:  10.040%   Long  Length of Stay:  23.184%      Impression:  Patient has stage D severe symptomatic aortic stenosis.  He describes stable symptoms of exertional shortness of breath, fatigue, and chronic lower extremity edema consistent with chronic diastolic congestive heart failure, New York Heart Association functional class IIb.  Symptoms have improved a little bit since recent permanent pacemaker implantation, but he remains significantly limited.  I personally reviewed the patient's recent transthoracic echocardiogram, diagnostic cardiac catheterization, and CT angiograms.  Echocardiogram confirms the presence of severe aortic stenosis.  The aortic valve is trileaflet with severe thickening, calcification, and restricted leaflet mobility involving all 3 leaflets of the aortic valve.  Peak velocity across the aortic valve measured 4.3 m/s corresponding to mean transvalvular gradient estimated 35 mmHg.  Left ventricular systolic function appears normal.  Diagnostic cardiac catheterization confirmed the presence of severe aortic stenosis and revealed severe native coronary artery disease.  There is continued patency of all the previously placed bypass grafts performed at the time of the patient's bypass surgery in 2005 although the radial artery graft is very small and has high-grade stenosis before a small distal target vessel.  I agree the patient would benefit from aortic valve replacement.  Risks associated with conventional surgery would be high and I would be very reluctant to consider this patient a candidate for conventional surgical aortic valve replacement with or without redo coronary artery bypass grafting.  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  I agree the  patient would best be treated with transcatheter aortic valve replacement.  Unfortunately, over the last few days the patient has developed acute respiratory illness potentially consistent with flulike syndrome although he has a productive cough and coarse rhonchi on exam consistent with possible acute tracheobronchitis.    Plan:  The patient and his wife were counseled at length regarding treatment alternatives for management of severe symptomatic aortic stenosis. Alternative approaches such as conventional aortic valve replacement, transcatheter aortic valve replacement, and continued medical therapy without intervention were compared and contrasted at length.  The risks associated with conventional surgical aortic valve replacement were discussed in detail, as were expectations for post-operative convalescence, and why I would be reluctant to consider this patient a candidate for conventional surgery.  Issues specific to transcatheter aortic valve replacement were discussed including questions about long term valve durability, the potential for paravalvular leak, possible increased risk of need for permanent pacemaker placement, and other technical complications related to the procedure itself.  Long-term prognosis with medical therapy was discussed. This discussion was placed in the context of the patient's own specific clinical presentation and past medical history.  All of their questions have been addressed.  The patient hopes to proceed with transcatheter aortic valve replacement in the near future.  However, he is concerned about his recent acute upper respiratory tract infection and understands that plans for surgery may need to be postponed if his symptoms do not improve fairly quickly.  The patient will undergo chest x-ray today to rule out  pneumonia and has been given a prescription for oral doxycycline.  If symptoms do not improve by the end of the week the patient will seek follow-up with his primary care physician and we will temporarily postpone plans for transcatheter aortic valve replacement.  On the other hand, if his symptoms resolved fairly quickly we will plan to proceed with transcatheter aortic valve replacement on April 25, 2017 as previously planned.  Following the decision to proceed with transcatheter aortic valve replacement, a discussion has been held regarding what types of management strategies would be attempted intraoperatively in the event of life-threatening complications, including whether or not the patient would be considered a candidate for the use of cardiopulmonary bypass and/or conversion to open sternotomy for attempted surgical intervention.  The patient has been advised of a variety of complications that might develop including but not limited to risks of death, stroke, paravalvular leak, aortic dissection or other major vascular complications, aortic annulus rupture, device embolization, cardiac rupture or perforation, mitral regurgitation, acute myocardial infarction, arrhythmia, heart block or bradycardia requiring permanent pacemaker placement, congestive heart failure, respiratory failure, renal failure, pneumonia, infection, other late complications related to structural valve deterioration or migration, or other complications that might ultimately cause a temporary or permanent loss of functional independence or other long term morbidity.  The patient provides full informed consent for the procedure as described and all questions were answered.   I spent in excess of 90 minutes during the conduct of this office consultation and >50% of this time involved direct face-to-face encounter with the patient for counseling and/or coordination of their care.     Valentina Gu. Roxy Manns, MD 04/17/2017 11:06  AM    Addendum:  CHEST - 2 VIEW  COMPARISON:  Chest x-ray 03/24/2017 and chest CT 03/30/2017  FINDINGS: Stable surgical changes from bypass surgery. The heart is enlarged but stable. The right ventricular pacer lead is stable. Mild tortuosity and calcification of the thoracic aorta is again demonstrated. The  lungs are clear of acute process. Mild vascular congestion without overt pulmonary edema.  IMPRESSION: Mild stable cardiac enlargement and mild central vascular congestion but no edema, infiltrates or effusions.   Electronically Signed   By: Marijo Sanes M.D.   On: 04/17/2017 12:16   Clarence H. Roxy Manns, MD 04/17/2017 6:36 PM

## 2017-04-17 NOTE — Patient Instructions (Signed)
Take Z-pack as directed  Contact your primary care physician if symptoms do not improve and resolve within 5-7 days

## 2017-04-19 ENCOUNTER — Telehealth: Payer: Self-pay

## 2017-04-19 NOTE — Telephone Encounter (Signed)
The pt's wife contacted me in regards to her husband still feeling ill with cough.  The pt has not had a fever since Saturday but he continues to cough and in general does not feel well.  The pt is taking doxycycline as prescribed but has told his wife it makes his stomach burn.  At this time she feels like the pt's surgery (TAVR) on 4/2 should be rescheduled to a later date.  I made her aware that I will make arrangements to get TAVR rescheduled to 4/16 and I will contact her with an update once procedure is rescheduled.  I did advise her to contact the pt's PCP for further evaluation of symptoms. Pt's wife agreed with plan.

## 2017-04-19 NOTE — Telephone Encounter (Signed)
thx Ander Purpura

## 2017-04-20 ENCOUNTER — Other Ambulatory Visit: Payer: Self-pay

## 2017-04-20 DIAGNOSIS — I350 Nonrheumatic aortic (valve) stenosis: Secondary | ICD-10-CM

## 2017-04-21 ENCOUNTER — Inpatient Hospital Stay (HOSPITAL_COMMUNITY): Admission: RE | Admit: 2017-04-21 | Payer: Medicare Other | Source: Ambulatory Visit

## 2017-04-28 ENCOUNTER — Telehealth: Payer: Self-pay | Admitting: Cardiology

## 2017-04-28 NOTE — Telephone Encounter (Signed)
Patient started Cufuroxime 250mg  tablets yesterday. He is to take this for 7 days. His TAVR is scheduled for 4/16. She is worried about the surgery b/c the cough/medication makes him so weak. Will route to Theodosia Quay, RN who has addressed similar concerns before/moved procedure date

## 2017-04-28 NOTE — Telephone Encounter (Signed)
New Message   Pt c/o medication issue:  1. Name of Medication: cufuroxime   2. How are you currently taking this medication (dosage and times per day)? 2 times a day  3. Are you having a reaction (difficulty breathing--STAT)?   4. What is your medication issue? Patient wife is calling on behalf of patient. Her concern is that the patient has a bad cough and drainage. She is unsure if whether or not they should keep the appointment for his upcoming procedure. Please call to discuss.

## 2017-05-01 NOTE — Telephone Encounter (Signed)
I spoke with the pt's wife and the pt was previously taking doxycycline and had to stop this medication due to stomach burning. The PCP started a 7 day course of Cefuroxime which the pt will complete on Wednesday. The pt continues to cough and have nasal drainage.  No fever.  The pt's weight has decreased since he was in the hospital.  The pt is currently scheduled for TAVR on 05/09/17.  I will discuss this pt's case with the Multidisciplinary team tomorrow and make the pt's wife aware of plan.

## 2017-05-02 ENCOUNTER — Telehealth: Payer: Self-pay

## 2017-05-02 NOTE — Telephone Encounter (Signed)
Pt is currently scheduled for TAVR on 05/09/17.  Pt's case was discussed with the Multidisciplinary team this morning and they felt like the pt should see PCP this week due to respiratory symptoms (cough and nasal drainage).  The pt will complete antibiotic course tomorrow and per pt's spouse the pt is feeling a little bit better today. TAVR team recommended that pt have Flu test to rule out and have CXR performed.  I have arranged an apt on 05/03/17 at 10:30 AM with Grayland Ormond PA.  Pt's wife aware. If CXR needs to be performed in [place] then the pt's wife will contact me to arrange.  Theodosia Quay RN-BSN Structural Heart Nurse Navigator [phone]

## 2017-05-04 NOTE — Progress Notes (Signed)
Pt was seen in PCP office yesterday due to persistent cough and congestion for the past 2 weeks. Flu test was negative and CXR was performed (scanned under media tab). CXR showed no radiographic evidence of acute cardiopulmonary disease. The pt did have wheezing and was given triamcinolone acetonide 60mg  injection.  Prescription was given to extend Ceftin Rx and also given Hycodan to use as needed. The pt was previously scheduled for TAVR on 4/2 but this was postponed due to symptoms.  The pt has not had improvement in symptoms at this time.  I reviewed this information with Dr Cyndia Bent and he advised that the pt should continue with plans for TAVR on 05/09/2017. I contacted the pt's wife and made her aware of this information.

## 2017-05-04 NOTE — Progress Notes (Addendum)
PCP: Adrian Prows, MD  Cardiologist: [name] [name], MD  EKG: obtained today per order-within 1 month of surgery  Stress test: 02/07/11 in [facility]  ECHO:03/01/17 in [facility]  Cardiac Cath: 03/23/17 in [facility]  Chest x-ray: obtained today per order-within 2 weeks of surgery  Patient instructed to hold Eliquis beginning 05/04/17 by his physician and he has stopped as instructed.  Called and spoke with Fredda Hammed (covering for [employer]) -[employer] representative.  A representative will come day of surgery (05/09/17) and should arrive at 930 am.

## 2017-05-04 NOTE — Pre-Procedure Instructions (Signed)
JAVION HOLMER  05/04/2017      [id number] - [place], [place] - [address] [place] [address] Phone: [phone] Fax: [phone]    Your procedure is scheduled on May 09, 2017.  Report to [facility] Admitting at 800 AM.  Call this number if you have problems the morning of surgery:  [phone]   Remember:  Do not eat food or drink liquids after midnight.  Take these medicines the morning of surgery with A SIP OF WATER none.  Last dose of Eliquis 05/04/17  Beginning now, STOP taking any  Aleve, Naproxen, Ibuprofen, Motrin, Advil, Goody's, BC's, all herbal medications, fish oil, and all vitamins  Continue all other medications as instructed by your physician except follow the above medication instructions before surgery   WHAT DO I DO ABOUT MY DIABETES MEDICATION?  Marland Kitchen Do not take oral diabetes medicines (pills) the morning of surgery glimepiride (amaryl).   Reviewed and Endorsed by [facility] Patient Education Committee, August 2015  How to Manage Your Diabetes Before and After Surgery  Why is it important to control my blood sugar before and after surgery? . Improving blood sugar levels before and after surgery helps healing and can limit problems. . A way of improving blood sugar control is eating a healthy diet by: o  Eating less sugar and carbohydrates o  Increasing activity/exercise o  Talking with your doctor about reaching your blood sugar goals . High blood sugars (greater than 180 mg/dL) can raise your risk of infections and slow your recovery, so you will need to focus on controlling your diabetes during the weeks before surgery. . Make sure that the doctor who takes care of your diabetes knows about your planned surgery including the date and location.  How do I manage my blood sugar before surgery? . Check your blood sugar at least 4 times a day, starting 2 days before surgery, to make sure  that the level is not too high or low. o Check your blood sugar the morning of your surgery when you wake up and every 2 hours until you get to the Short Stay unit. . If your blood sugar is less than 70 mg/dL, you will need to treat for low blood sugar: o Do not take insulin. o Treat a low blood sugar (less than 70 mg/dL) with  cup of clear juice (cranberry or apple), 4 glucose tablets, OR glucose gel. Recheck blood sugar in 15 minutes after treatment (to make sure it is greater than 70 mg/dL). If your blood sugar is not greater than 70 mg/dL on recheck, call [phone] o  for further instructions. . Report your blood sugar to the short stay nurse when you get to Short Stay.  . If you are admitted to the hospital after surgery: o Your blood sugar will be checked by the staff and you will probably be given insulin after surgery (instead of oral diabetes medicines) to make sure you have good blood sugar levels. o The goal for blood sugar control after surgery is 80-180 mg/dL.   Contacts, dentures or bridgework may not be worn into surgery.  Leave your suitcase in the car.  After surgery it may be brought to your room.  For patients admitted to the hospital, discharge time will be determined by your treatment team.  Patients discharged the day of surgery will not be allowed to drive home.    [place]- Preparing  For Surgery  Before surgery, you can play an important role. Because skin is not sterile, your skin needs to be as free of germs as possible. You can reduce the number of germs on your skin by washing with CHG (chlorahexidine gluconate) Soap before surgery.  CHG is an antiseptic cleaner which kills germs and bonds with the skin to continue killing germs even after washing.  Please do not use if you have an allergy to CHG or antibacterial soaps. If your skin becomes reddened/irritated stop using the CHG.  Do not shave (including legs and underarms) for at least 48 hours prior to  first CHG shower. It is OK to shave your face.  Please follow these instructions carefully.   1. Shower the NIGHT BEFORE SURGERY and the MORNING OF SURGERY with CHG.   2. If you chose to wash your hair, wash your hair first as usual with your normal shampoo.  3. After you shampoo, rinse your hair and body thoroughly to remove the shampoo.  4. Use CHG as you would any other liquid soap. You can apply CHG directly to the skin and wash gently with a scrungie or a clean washcloth.   5. Apply the CHG Soap to your body ONLY FROM THE NECK DOWN.  Do not use on open wounds or open sores. Avoid contact with your eyes, ears, mouth and genitals (private parts). Wash Face and genitals (private parts)  with your normal soap.  6. Wash thoroughly, paying special attention to the area where your surgery will be performed.  7. Thoroughly rinse your body with warm water from the neck down.  8. DO NOT shower/wash with your normal soap after using and rinsing off the CHG Soap.  9. Pat yourself dry with a CLEAN TOWEL.  10. Wear CLEAN PAJAMAS to bed the night before surgery, wear comfortable clothes the morning of surgery  11. Place CLEAN SHEETS on your bed the night of your first shower and DO NOT SLEEP WITH PETS.  Day of Surgery: Do not apply any deodorants/lotions. Please wear clean clothes to the hospital/surgery center.     Do not wear jewelry.h.  Do not wear lotions, powders, or colognes, or deodorant.  Men may shave face and neck.  Do not bring valuables to the hospital.  [facility] is not responsible for any belongings or valuables.  Please read over the following fact sheets that you were given. Pain Booklet, Coughing and Deep Breathing, MRSA Information and Surgical Site Infection Prevention

## 2017-05-05 ENCOUNTER — Encounter (HOSPITAL_COMMUNITY)
Admission: RE | Admit: 2017-05-05 | Discharge: 2017-05-05 | Disposition: A | Discharge status: Home / Self Care | Payer: Medicare Other | Source: Ambulatory Visit | Attending: Cardiovascular Disease | Admitting: Cardiovascular Disease

## 2017-05-05 ENCOUNTER — Ambulatory Visit (HOSPITAL_COMMUNITY)
Admission: RE | Admit: 2017-05-05 | Discharge: 2017-05-05 | Disposition: A | Discharge status: Home / Self Care | Payer: Medicare Other | Source: Ambulatory Visit | Attending: Cardiovascular Disease | Admitting: Cardiovascular Disease

## 2017-05-05 ENCOUNTER — Other Ambulatory Visit: Payer: Self-pay

## 2017-05-05 ENCOUNTER — Encounter (HOSPITAL_COMMUNITY): Payer: Self-pay

## 2017-05-05 DIAGNOSIS — Z01810 Encounter for preprocedural cardiovascular examination: Secondary | ICD-10-CM | POA: Insufficient documentation

## 2017-05-05 DIAGNOSIS — I509 Heart failure, unspecified: Secondary | ICD-10-CM | POA: Diagnosis not present

## 2017-05-05 DIAGNOSIS — I110 Hypertensive heart disease with heart failure: Secondary | ICD-10-CM | POA: Insufficient documentation

## 2017-05-05 DIAGNOSIS — R9431 Abnormal electrocardiogram [ECG] [EKG]: Secondary | ICD-10-CM | POA: Insufficient documentation

## 2017-05-05 DIAGNOSIS — I350 Nonrheumatic aortic (valve) stenosis: Secondary | ICD-10-CM

## 2017-05-05 DIAGNOSIS — I2510 Atherosclerotic heart disease of native coronary artery without angina pectoris: Secondary | ICD-10-CM | POA: Diagnosis not present

## 2017-05-05 DIAGNOSIS — Z01812 Encounter for preprocedural laboratory examination: Secondary | ICD-10-CM | POA: Diagnosis present

## 2017-05-05 DIAGNOSIS — Z8673 Personal history of transient ischemic attack (TIA), and cerebral infarction without residual deficits: Secondary | ICD-10-CM | POA: Diagnosis not present

## 2017-05-05 DIAGNOSIS — E119 Type 2 diabetes mellitus without complications: Secondary | ICD-10-CM | POA: Insufficient documentation

## 2017-05-05 DIAGNOSIS — I517 Cardiomegaly: Secondary | ICD-10-CM | POA: Insufficient documentation

## 2017-05-05 DIAGNOSIS — Z01818 Encounter for other preprocedural examination: Secondary | ICD-10-CM | POA: Diagnosis not present

## 2017-05-05 LAB — URINALYSIS, ROUTINE W REFLEX MICROSCOPIC
Bilirubin Urine: NEGATIVE
GLUCOSE, UA: NEGATIVE mg/dL
HGB URINE DIPSTICK: NEGATIVE
KETONES UR: NEGATIVE mg/dL
LEUKOCYTES UA: NEGATIVE
Nitrite: NEGATIVE
PH: 6 (ref 5.0–8.0)
Protein, ur: NEGATIVE mg/dL
Specific Gravity, Urine: 1.011 (ref 1.005–1.030)

## 2017-05-05 LAB — COMPREHENSIVE METABOLIC PANEL
ALBUMIN: 4.1 g/dL (ref 3.5–5.0)
ALT: 20 U/L (ref 17–63)
AST: 22 U/L (ref 15–41)
Alkaline Phosphatase: 68 U/L (ref 38–126)
Anion gap: 14 (ref 5–15)
BUN: 24 mg/dL — ABNORMAL HIGH (ref 6–20)
CHLORIDE: 103 mmol/L (ref 101–111)
CO2: 18 mmol/L — AB (ref 22–32)
Calcium: 9.5 mg/dL (ref 8.9–10.3)
Creatinine, Ser: 1.19 mg/dL (ref 0.61–1.24)
GFR calc Af Amer: >60 mL/min (ref >60)
GFR calc non Af Amer: 58 mL/min — ABNORMAL LOW (ref >60)
GLUCOSE: 140 mg/dL — AB (ref 65–99)
POTASSIUM: 4.5 mmol/L (ref 3.5–5.1)
SODIUM: 135 mmol/L (ref 135–145)
TOTAL PROTEIN: 7 g/dL (ref 6.5–8.1)
Total Bilirubin: 1.1 mg/dL (ref 0.3–1.2)

## 2017-05-05 LAB — CBC
HCT: 33.5 % — ABNORMAL LOW (ref 39.0–52.0)
Hemoglobin: 11.1 g/dL — ABNORMAL LOW (ref 13.0–17.0)
MCH: 31.6 pg (ref 26.0–34.0)
MCHC: 33.1 g/dL (ref 30.0–36.0)
MCV: 95.4 fL (ref 78.0–100.0)
PLATELETS: 231 10*3/uL (ref 150–400)
RBC: 3.51 MIL/uL — ABNORMAL LOW (ref 4.22–5.81)
RDW: 15 % (ref 11.5–15.5)
WBC: 6.2 10*3/uL (ref 4.0–10.5)

## 2017-05-05 LAB — BLOOD GAS, ARTERIAL
ACID-BASE DEFICIT: 0.5 mmol/L (ref 0.0–2.0)
Bicarbonate: 23.1 mmol/L (ref 20.0–28.0)
DRAWN BY: 470591
FIO2: 21
O2 SAT: 98.3 %
Patient temperature: 98.6
pCO2 arterial: 34.1 mmHg (ref 32.0–48.0)
pH, Arterial: 7.445 (ref 7.350–7.450)
pO2, Arterial: 102 mmHg (ref 83.0–108.0)

## 2017-05-05 LAB — SURGICAL PCR SCREEN
MRSA, PCR: NEGATIVE
STAPHYLOCOCCUS AUREUS: NEGATIVE

## 2017-05-05 LAB — TYPE AND SCREEN
ABO/RH(D): A POS
ANTIBODY SCREEN: NEGATIVE

## 2017-05-05 LAB — BRAIN NATRIURETIC PEPTIDE: B NATRIURETIC PEPTIDE 5: 280 pg/mL — AB (ref 0.0–100.0)

## 2017-05-05 LAB — HEMOGLOBIN A1C
Hgb A1c MFr Bld: 6.8 % — ABNORMAL HIGH (ref 4.8–5.6)
Mean Plasma Glucose: 148.46 mg/dL

## 2017-05-05 LAB — APTT: aPTT: 31 seconds (ref 24–36)

## 2017-05-05 LAB — PROTIME-INR
INR: 1.16
PROTHROMBIN TIME: 14.7 s (ref 11.4–15.2)

## 2017-05-05 LAB — GLUCOSE, CAPILLARY: GLUCOSE-CAPILLARY: 151 mg/dL — AB (ref 65–99)

## 2017-05-08 MED ORDER — EPINEPHRINE PF 1 MG/ML IJ SOLN
0.0000 ug/min | INTRAVENOUS | Status: DC
  Filled 2017-05-08: qty 4

## 2017-05-08 MED ORDER — SODIUM CHLORIDE 0.9 % IV SOLN
INTRAVENOUS | Status: DC
  Filled 2017-05-08: qty 1

## 2017-05-08 MED ORDER — MAGNESIUM SULFATE 50 % IJ SOLN
40.0000 meq | INTRAMUSCULAR | Status: DC
  Filled 2017-05-08: qty 9.85

## 2017-05-08 MED ORDER — DOPAMINE-DEXTROSE 3.2-5 MG/ML-% IV SOLN
0.0000 ug/kg/min | INTRAVENOUS | Status: DC
  Filled 2017-05-08: qty 250

## 2017-05-08 MED ORDER — NITROGLYCERIN IN D5W 200-5 MCG/ML-% IV SOLN
2.0000 ug/min | INTRAVENOUS | Status: DC
  Filled 2017-05-08: qty 250

## 2017-05-08 MED ORDER — DEXTROSE 5 % IV SOLN
0.0000 ug/min | INTRAVENOUS | Status: DC
  Filled 2017-05-08: qty 4

## 2017-05-08 MED ORDER — SODIUM CHLORIDE 0.9 % IV SOLN
30.0000 ug/min | INTRAVENOUS | Status: DC
  Filled 2017-05-08: qty 2

## 2017-05-08 MED ORDER — CEFUROXIME SODIUM 1.5 G IV SOLR
1.5000 g | INTRAVENOUS | Status: AC
  Administered 2017-05-09: 1.5 g via INTRAVENOUS
  Filled 2017-05-08: qty 1.5

## 2017-05-08 MED ORDER — SODIUM CHLORIDE 0.9 % IV SOLN
INTRAVENOUS | Status: DC
  Filled 2017-05-08: qty 30

## 2017-05-08 MED ORDER — POTASSIUM CHLORIDE 2 MEQ/ML IV SOLN
80.0000 meq | INTRAVENOUS | Status: DC
  Filled 2017-05-08: qty 40

## 2017-05-08 MED ORDER — VANCOMYCIN HCL 10 G IV SOLR
1500.0000 mg | INTRAVENOUS | Status: DC
  Filled 2017-05-08: qty 1500

## 2017-05-08 MED ORDER — DEXMEDETOMIDINE HCL IN NACL 400 MCG/100ML IV SOLN
0.1000 ug/kg/h | INTRAVENOUS | Status: DC
  Filled 2017-05-08: qty 100

## 2017-05-09 ENCOUNTER — Inpatient Hospital Stay (HOSPITAL_COMMUNITY): Payer: Medicare Other

## 2017-05-09 ENCOUNTER — Inpatient Hospital Stay (HOSPITAL_COMMUNITY): Payer: Medicare Other | Admitting: Emergency Medicine

## 2017-05-09 ENCOUNTER — Encounter (HOSPITAL_COMMUNITY): Payer: Self-pay | Admitting: *Deleted

## 2017-05-09 ENCOUNTER — Inpatient Hospital Stay (HOSPITAL_COMMUNITY)
Admission: RE | Admit: 2017-05-09 | Discharge: 2017-05-11 | DRG: 267 | Disposition: A | Discharge status: Home / Self Care | Payer: Medicare Other | Source: Ambulatory Visit | Attending: Surgery | Admitting: Surgery

## 2017-05-09 ENCOUNTER — Other Ambulatory Visit: Payer: Self-pay

## 2017-05-09 ENCOUNTER — Encounter (HOSPITAL_COMMUNITY)
Admission: RE | Disposition: A | Discharge status: Home / Self Care | Payer: Self-pay | Source: Ambulatory Visit | Attending: Surgery

## 2017-05-09 DIAGNOSIS — Z951 Presence of aortocoronary bypass graft: Secondary | ICD-10-CM

## 2017-05-09 DIAGNOSIS — I2510 Atherosclerotic heart disease of native coronary artery without angina pectoris: Secondary | ICD-10-CM | POA: Diagnosis present

## 2017-05-09 DIAGNOSIS — E785 Hyperlipidemia, unspecified: Secondary | ICD-10-CM | POA: Diagnosis present

## 2017-05-09 DIAGNOSIS — Z006 Encounter for examination for normal comparison and control in clinical research program: Secondary | ICD-10-CM | POA: Diagnosis not present

## 2017-05-09 DIAGNOSIS — H9190 Unspecified hearing loss, unspecified ear: Secondary | ICD-10-CM | POA: Diagnosis present

## 2017-05-09 DIAGNOSIS — I482 Chronic atrial fibrillation: Secondary | ICD-10-CM | POA: Diagnosis present

## 2017-05-09 DIAGNOSIS — K7460 Unspecified cirrhosis of liver: Secondary | ICD-10-CM | POA: Diagnosis present

## 2017-05-09 DIAGNOSIS — Z6836 Body mass index (BMI) 36.0-36.9, adult: Secondary | ICD-10-CM | POA: Diagnosis not present

## 2017-05-09 DIAGNOSIS — Z8249 Family history of ischemic heart disease and other diseases of the circulatory system: Secondary | ICD-10-CM

## 2017-05-09 DIAGNOSIS — E1122 Type 2 diabetes mellitus with diabetic chronic kidney disease: Secondary | ICD-10-CM | POA: Diagnosis present

## 2017-05-09 DIAGNOSIS — I700 Atherosclerosis of aorta: Secondary | ICD-10-CM | POA: Diagnosis present

## 2017-05-09 DIAGNOSIS — J449 Chronic obstructive pulmonary disease, unspecified: Secondary | ICD-10-CM | POA: Diagnosis present

## 2017-05-09 DIAGNOSIS — Z8673 Personal history of transient ischemic attack (TIA), and cerebral infarction without residual deficits: Secondary | ICD-10-CM

## 2017-05-09 DIAGNOSIS — I272 Pulmonary hypertension, unspecified: Secondary | ICD-10-CM | POA: Diagnosis present

## 2017-05-09 DIAGNOSIS — Z87891 Personal history of nicotine dependence: Secondary | ICD-10-CM

## 2017-05-09 DIAGNOSIS — Z954 Presence of other heart-valve replacement: Secondary | ICD-10-CM | POA: Diagnosis not present

## 2017-05-09 DIAGNOSIS — I130 Hypertensive heart and chronic kidney disease with heart failure and stage 1 through stage 4 chronic kidney disease, or unspecified chronic kidney disease: Secondary | ICD-10-CM | POA: Diagnosis present

## 2017-05-09 DIAGNOSIS — I350 Nonrheumatic aortic (valve) stenosis: Principal | ICD-10-CM

## 2017-05-09 DIAGNOSIS — Z950 Presence of cardiac pacemaker: Secondary | ICD-10-CM

## 2017-05-09 DIAGNOSIS — Z820 Family history of epilepsy and other diseases of the nervous system: Secondary | ICD-10-CM | POA: Diagnosis not present

## 2017-05-09 DIAGNOSIS — I70202 Unspecified atherosclerosis of native arteries of extremities, left leg: Secondary | ICD-10-CM | POA: Diagnosis present

## 2017-05-09 DIAGNOSIS — D649 Anemia, unspecified: Secondary | ICD-10-CM | POA: Diagnosis present

## 2017-05-09 DIAGNOSIS — Z952 Presence of prosthetic heart valve: Secondary | ICD-10-CM | POA: Diagnosis not present

## 2017-05-09 DIAGNOSIS — N182 Chronic kidney disease, stage 2 (mild): Secondary | ICD-10-CM | POA: Diagnosis present

## 2017-05-09 DIAGNOSIS — I5032 Chronic diastolic (congestive) heart failure: Secondary | ICD-10-CM | POA: Diagnosis present

## 2017-05-09 DIAGNOSIS — E669 Obesity, unspecified: Secondary | ICD-10-CM | POA: Diagnosis present

## 2017-05-09 HISTORY — PX: TEE WITHOUT CARDIOVERSION: SHX5443

## 2017-05-09 HISTORY — PX: TRANSCATHETER AORTIC VALVE REPLACEMENT, TRANSFEMORAL: SHX6400

## 2017-05-09 LAB — POCT I-STAT, CHEM 8
BUN: 20 mg/dL (ref 6–20)
BUN: 20 mg/dL (ref 6–20)
CHLORIDE: 103 mmol/L (ref 101–111)
CREATININE: 1 mg/dL (ref 0.61–1.24)
CREATININE: 0.9 mg/dL (ref 0.61–1.24)
Calcium, Ion: 1.28 mmol/L (ref 1.15–1.40)
Calcium, Ion: 1.26 mmol/L (ref 1.15–1.40)
Chloride: 104 mmol/L (ref 101–111)
Glucose, Bld: 171 mg/dL — ABNORMAL HIGH (ref 65–99)
Glucose, Bld: 179 mg/dL — ABNORMAL HIGH (ref 65–99)
HEMATOCRIT: 30 % — AB (ref 39.0–52.0)
HEMATOCRIT: 30 % — AB (ref 39.0–52.0)
HEMOGLOBIN: 10.2 g/dL — AB (ref 13.0–17.0)
Hemoglobin: 10.2 g/dL — ABNORMAL LOW (ref 13.0–17.0)
POTASSIUM: 4.7 mmol/L (ref 3.5–5.1)
POTASSIUM: 5.1 mmol/L (ref 3.5–5.1)
Sodium: 138 mmol/L (ref 135–145)
Sodium: 138 mmol/L (ref 135–145)
TCO2: 27 mmol/L (ref 22–32)
TCO2: 26 mmol/L (ref 22–32)

## 2017-05-09 LAB — POCT I-STAT 3, ART BLOOD GAS (G3+)
ACID-BASE DEFICIT: 1 mmol/L (ref 0.0–2.0)
Acid-base deficit: 1 mmol/L (ref 0.0–2.0)
BICARBONATE: 26.1 mmol/L (ref 20.0–28.0)
BICARBONATE: 24.7 mmol/L (ref 20.0–28.0)
O2 Saturation: 99 %
O2 Saturation: 99 %
PCO2 ART: 52 mmHg — AB (ref 32.0–48.0)
PH ART: 7.31 — AB (ref 7.350–7.450)
PH ART: 7.337 — AB (ref 7.350–7.450)
PO2 ART: 165 mmHg — AB (ref 83.0–108.0)
TCO2: 28 mmol/L (ref 22–32)
TCO2: 26 mmol/L (ref 22–32)
pCO2 arterial: 46 mmHg (ref 32.0–48.0)
pO2, Arterial: 158 mmHg — ABNORMAL HIGH (ref 83.0–108.0)

## 2017-05-09 LAB — POCT I-STAT 4, (NA,K, GLUC, HGB,HCT)
Glucose, Bld: 167 mg/dL — ABNORMAL HIGH (ref 65–99)
HCT: 30 % — ABNORMAL LOW (ref 39.0–52.0)
Hemoglobin: 10.2 g/dL — ABNORMAL LOW (ref 13.0–17.0)
Potassium: 5.4 mmol/L — ABNORMAL HIGH (ref 3.5–5.1)
Sodium: 138 mmol/L (ref 135–145)

## 2017-05-09 LAB — GLUCOSE, CAPILLARY
GLUCOSE-CAPILLARY: 127 mg/dL — AB (ref 65–99)
Glucose-Capillary: 115 mg/dL — ABNORMAL HIGH (ref 65–99)

## 2017-05-09 SURGERY — IMPLANTATION, AORTIC VALVE, TRANSCATHETER, FEMORAL APPROACH
Anesthesia: Monitor Anesthesia Care | Site: Chest

## 2017-05-09 MED ORDER — METOPROLOL TARTRATE 12.5 MG HALF TABLET
12.5000 mg | ORAL_TABLET | Freq: Two times a day (BID) | ORAL | Status: DC
  Administered 2017-05-09: 12.5 mg via ORAL
  Filled 2017-05-09: qty 1

## 2017-05-09 MED ORDER — SODIUM CHLORIDE 0.9 % IV SOLN: INTRAVENOUS | Status: DC

## 2017-05-09 MED ORDER — SODIUM CHLORIDE 0.9% FLUSH: 3.0000 mL | INTRAVENOUS | Status: DC | PRN

## 2017-05-09 MED ORDER — METOPROLOL TARTRATE 5 MG/5ML IV SOLN: 2.5000 mg | INTRAVENOUS | Status: DC | PRN

## 2017-05-09 MED ORDER — METOPROLOL TARTRATE 25 MG/10 ML ORAL SUSPENSION: 12.5000 mg | Freq: Two times a day (BID) | ORAL | Status: DC

## 2017-05-09 MED ORDER — LACTATED RINGERS IV SOLN: INTRAVENOUS | Status: DC

## 2017-05-09 MED ORDER — LACTATED RINGERS IV SOLN: 500.0000 mL | Freq: Once | INTRAVENOUS | Status: DC | PRN

## 2017-05-09 MED ORDER — 0.9 % SODIUM CHLORIDE (POUR BTL) OPTIME
TOPICAL | Status: DC | PRN
  Administered 2017-05-09: 1000 mL

## 2017-05-09 MED ORDER — ASPIRIN EC 81 MG PO TBEC
81.0000 mg | DELAYED_RELEASE_TABLET | Freq: Every day | ORAL | Status: DC
  Administered 2017-05-10 – 2017-05-11 (×2): 81 mg via ORAL
  Filled 2017-05-09 (×2): qty 1

## 2017-05-09 MED ORDER — POLYVINYL ALCOHOL 1.4 % OP SOLN
2.0000 [drp] | Freq: Two times a day (BID) | OPHTHALMIC | Status: DC | PRN
Start: 2017-05-09 — End: 2017-05-11
  Filled 2017-05-09: qty 15

## 2017-05-09 MED ORDER — FENTANYL CITRATE (PF) 100 MCG/2ML IJ SOLN
INTRAMUSCULAR | Status: DC | PRN
  Administered 2017-05-09 (×2): 50 ug via INTRAVENOUS

## 2017-05-09 MED ORDER — SODIUM CHLORIDE 0.9 % IV SOLN
INTRAVENOUS | Status: DC | PRN
  Administered 2017-05-09: 1500 mL

## 2017-05-09 MED ORDER — VANCOMYCIN HCL IN DEXTROSE 1-5 GM/200ML-% IV SOLN
1000.0000 mg | Freq: Once | INTRAVENOUS | Status: AC
  Administered 2017-05-09: 1000 mg via INTRAVENOUS
  Filled 2017-05-09: qty 200

## 2017-05-09 MED ORDER — CHLORHEXIDINE GLUCONATE 4 % EX LIQD: 60.0000 mL | Freq: Once | CUTANEOUS | Status: DC

## 2017-05-09 MED ORDER — CHLORHEXIDINE GLUCONATE 4 % EX LIQD: 30.0000 mL | CUTANEOUS | Status: DC

## 2017-05-09 MED ORDER — LIDOCAINE HCL 1 % IJ SOLN
INTRAMUSCULAR | Status: DC | PRN
  Administered 2017-05-09: 3 mL

## 2017-05-09 MED ORDER — IODIXANOL 320 MG/ML IV SOLN
INTRAVENOUS | Status: DC | PRN
  Administered 2017-05-09: 40.1 mL via INTRAVENOUS

## 2017-05-09 MED ORDER — CHLORHEXIDINE GLUCONATE 0.12 % MT SOLN
15.0000 mL | Freq: Once | OROMUCOSAL | Status: AC
  Administered 2017-05-09: 15 mL via OROMUCOSAL
  Filled 2017-05-09: qty 15

## 2017-05-09 MED ORDER — NITROGLYCERIN IN D5W 200-5 MCG/ML-% IV SOLN: 0.0000 ug/min | INTRAVENOUS | Status: DC

## 2017-05-09 MED ORDER — SODIUM CHLORIDE 0.9 % IV SOLN: 250.0000 mL | INTRAVENOUS | Status: DC | PRN

## 2017-05-09 MED ORDER — LIDOCAINE HCL (PF) 1 % IJ SOLN
INTRAMUSCULAR | Status: AC
  Filled 2017-05-09: qty 30

## 2017-05-09 MED ORDER — LACTATED RINGERS IV SOLN
INTRAVENOUS | Status: DC | PRN
  Administered 2017-05-09: 10:00:00 via INTRAVENOUS

## 2017-05-09 MED ORDER — FENTANYL CITRATE (PF) 100 MCG/2ML IJ SOLN
INTRAMUSCULAR | Status: AC
  Filled 2017-05-09: qty 2

## 2017-05-09 MED ORDER — INSULIN ASPART 100 UNIT/ML ~~LOC~~ SOLN
0.0000 [IU] | Freq: Three times a day (TID) | SUBCUTANEOUS | Status: DC
  Administered 2017-05-10: 8 [IU] via SUBCUTANEOUS
  Administered 2017-05-10: 2 [IU] via SUBCUTANEOUS

## 2017-05-09 MED ORDER — ROSUVASTATIN CALCIUM 10 MG PO TABS
20.0000 mg | ORAL_TABLET | Freq: Every evening | ORAL | Status: DC
  Administered 2017-05-10: 20 mg via ORAL
  Filled 2017-05-09: qty 2
  Filled 2017-05-09: qty 1

## 2017-05-09 MED ORDER — LACTATED RINGERS IV SOLN
INTRAVENOUS | Status: DC
  Administered 2017-05-09 (×2): via INTRAVENOUS

## 2017-05-09 MED ORDER — PROTAMINE SULFATE 10 MG/ML IV SOLN
INTRAVENOUS | Status: DC | PRN
  Administered 2017-05-09: 180 mg via INTRAVENOUS

## 2017-05-09 MED ORDER — SODIUM CHLORIDE 0.9% FLUSH
3.0000 mL | Freq: Two times a day (BID) | INTRAVENOUS | Status: DC
  Administered 2017-05-09: 3 mL via INTRAVENOUS

## 2017-05-09 MED ORDER — CAMPHOR-MENTHOL-METHYL SAL 4-10-30 % EX CREA: 1.0000 "application " | TOPICAL_CREAM | Freq: Three times a day (TID) | CUTANEOUS | Status: DC | PRN

## 2017-05-09 MED ORDER — FENTANYL CITRATE (PF) 250 MCG/5ML IJ SOLN
INTRAMUSCULAR | Status: AC
  Filled 2017-05-09: qty 5

## 2017-05-09 MED ORDER — HEPARIN SODIUM (PORCINE) 1000 UNIT/ML IJ SOLN
INTRAMUSCULAR | Status: DC | PRN
  Administered 2017-05-09: 18000 [IU] via INTRAVENOUS

## 2017-05-09 MED ORDER — OXYCODONE HCL 5 MG PO TABS: 5.0000 mg | ORAL_TABLET | ORAL | Status: DC | PRN

## 2017-05-09 MED ORDER — DEXMEDETOMIDINE HCL IN NACL 400 MCG/100ML IV SOLN
INTRAVENOUS | Status: DC | PRN
  Administered 2017-05-09: 1 ug/kg/h via INTRAVENOUS

## 2017-05-09 MED ORDER — MIDAZOLAM HCL 2 MG/2ML IJ SOLN: 2.0000 mg | INTRAMUSCULAR | Status: DC | PRN

## 2017-05-09 MED ORDER — AMLODIPINE BESYLATE 5 MG PO TABS
2.5000 mg | ORAL_TABLET | Freq: Every day | ORAL | Status: DC
  Administered 2017-05-10 – 2017-05-11 (×2): 2.5 mg via ORAL
  Filled 2017-05-09 (×2): qty 1

## 2017-05-09 MED ORDER — MIDAZOLAM HCL 2 MG/2ML IJ SOLN
1.0000 mg | Freq: Once | INTRAMUSCULAR | Status: AC
  Administered 2017-05-09: 1 mg via INTRAVENOUS

## 2017-05-09 MED ORDER — MORPHINE SULFATE (PF) 2 MG/ML IV SOLN: 2.0000 mg | INTRAVENOUS | Status: DC | PRN

## 2017-05-09 MED ORDER — TAMSULOSIN HCL 0.4 MG PO CAPS
0.4000 mg | ORAL_CAPSULE | Freq: Every day | ORAL | Status: DC
  Administered 2017-05-09 – 2017-05-10 (×2): 0.4 mg via ORAL
  Filled 2017-05-09 (×2): qty 1

## 2017-05-09 MED ORDER — PROPOFOL 500 MG/50ML IV EMUL
INTRAVENOUS | Status: DC | PRN
  Administered 2017-05-09: 25 ug/kg/min via INTRAVENOUS

## 2017-05-09 MED ORDER — SODIUM CHLORIDE 0.9 % IV SOLN
INTRAVENOUS | Status: AC
  Filled 2017-05-09 (×3): qty 1.2

## 2017-05-09 MED ORDER — MIDAZOLAM HCL 2 MG/2ML IJ SOLN
INTRAMUSCULAR | Status: AC
  Filled 2017-05-09: qty 2

## 2017-05-09 MED ORDER — NOREPINEPHRINE BITARTRATE 1 MG/ML IV SOLN
0.0000 ug/min | INTRAVENOUS | Status: DC
  Filled 2017-05-09: qty 4

## 2017-05-09 MED ORDER — TRAMADOL HCL 50 MG PO TABS
50.0000 mg | ORAL_TABLET | ORAL | Status: DC | PRN
  Administered 2017-05-09 – 2017-05-11 (×2): 100 mg via ORAL
  Filled 2017-05-09 (×2): qty 2

## 2017-05-09 MED ORDER — FLUTICASONE PROPIONATE 50 MCG/ACT NA SUSP
2.0000 | Freq: Every day | NASAL | Status: DC
  Administered 2017-05-09 – 2017-05-11 (×3): 2 via NASAL
  Filled 2017-05-09: qty 16

## 2017-05-09 MED ORDER — ONDANSETRON HCL 4 MG/2ML IJ SOLN: 4.0000 mg | Freq: Four times a day (QID) | INTRAMUSCULAR | Status: DC | PRN

## 2017-05-09 MED ORDER — ALBUMIN HUMAN 5 % IV SOLN: 250.0000 mL | INTRAVENOUS | Status: DC | PRN

## 2017-05-09 MED ORDER — CEFAZOLIN SODIUM-DEXTROSE 2-4 GM/100ML-% IV SOLN
2.0000 g | Freq: Three times a day (TID) | INTRAVENOUS | Status: DC
  Administered 2017-05-09 – 2017-05-10 (×3): 2 g via INTRAVENOUS
  Filled 2017-05-09 (×3): qty 100

## 2017-05-09 MED ORDER — PROPOFOL 10 MG/ML IV BOLUS
INTRAVENOUS | Status: DC | PRN
  Administered 2017-05-09: 20 mg via INTRAVENOUS

## 2017-05-09 MED ORDER — GUAIFENESIN-CODEINE 100-10 MG/5ML PO SOLN: 5.0000 mL | Freq: Four times a day (QID) | ORAL | Status: DC | PRN

## 2017-05-09 MED ORDER — FENTANYL CITRATE (PF) 100 MCG/2ML IJ SOLN
100.0000 ug | Freq: Once | INTRAMUSCULAR | Status: AC
  Administered 2017-05-09: 100 ug via INTRAVENOUS

## 2017-05-09 MED ORDER — EZETIMIBE 10 MG PO TABS
10.0000 mg | ORAL_TABLET | Freq: Every evening | ORAL | Status: DC
  Administered 2017-05-09 – 2017-05-10 (×2): 10 mg via ORAL
  Filled 2017-05-09 (×2): qty 1

## 2017-05-09 MED ORDER — VANCOMYCIN HCL 1000 MG IV SOLR
INTRAVENOUS | Status: DC | PRN
  Administered 2017-05-09: 1500 mg via INTRAVENOUS

## 2017-05-09 MED ORDER — PERFLUTREN LIPID MICROSPHERE
INTRAVENOUS | Status: AC
  Filled 2017-05-09: qty 10

## 2017-05-09 MED ORDER — SODIUM CHLORIDE 0.9 % IV SOLN
1.0000 mL/kg/h | INTRAVENOUS | Status: AC
  Administered 2017-05-09: 1 mL/kg/h via INTRAVENOUS

## 2017-05-09 MED ORDER — ONDANSETRON HCL 4 MG/2ML IJ SOLN
INTRAMUSCULAR | Status: DC | PRN
  Administered 2017-05-09: 4 mg via INTRAVENOUS

## 2017-05-09 MED ORDER — DEXMEDETOMIDINE HCL 200 MCG/2ML IV SOLN
INTRAVENOUS | Status: DC | PRN
  Administered 2017-05-09 (×2): 60 ug via INTRAVENOUS

## 2017-05-09 MED ORDER — DOXYCYCLINE HYCLATE 100 MG PO CAPS: 100.0000 mg | ORAL_CAPSULE | Freq: Two times a day (BID) | ORAL | Status: DC

## 2017-05-09 MED ORDER — PERFLUTREN LIPID MICROSPHERE
1.0000 mL | INTRAVENOUS | Status: AC | PRN
  Administered 2017-05-09: 4 mL via INTRAVENOUS
  Filled 2017-05-09: qty 10

## 2017-05-09 MED ORDER — PHENYLEPHRINE HCL 10 MG/ML IJ SOLN
INTRAMUSCULAR | Status: DC | PRN
  Administered 2017-05-09 (×2): 80 ug via INTRAVENOUS

## 2017-05-09 SURGICAL SUPPLY — 94 items
BAG DECANTER FOR FLEXI CONT (MISCELLANEOUS) ×4 IMPLANT
BAG SNAP BAND KOVER 36X36 (MISCELLANEOUS) ×4 IMPLANT
BLADE CLIPPER SURG (BLADE) IMPLANT
BLADE STERNUM SYSTEM 6 (BLADE) IMPLANT
CABLE ADAPT CONN TEMP 6FT (ADAPTER) ×8 IMPLANT
CANISTER SUCT 3000ML PPV (MISCELLANEOUS) ×4 IMPLANT
CANNULA FEM VENOUS REMOTE 22FR (CANNULA) IMPLANT
CANNULA OPTISITE PERFUSION 16F (CANNULA) IMPLANT
CANNULA OPTISITE PERFUSION 18F (CANNULA) IMPLANT
CATH DIAG EXPO 6F VENT PIG 145 (CATHETERS) ×8 IMPLANT
CATH EXPO 5FR AL1 (CATHETERS) IMPLANT
CATH FOLEY 16FR TEMP PROBE (CATHETERS) ×4 IMPLANT
CATH INFINITI 6F AL2 (CATHETERS) ×4 IMPLANT
CATH S G BIP PACING (SET/KITS/TRAYS/PACK) ×4 IMPLANT
CLIP VESOCCLUDE MED 24/CT (CLIP) IMPLANT
CLIP VESOCCLUDE SM WIDE 24/CT (CLIP) IMPLANT
CONT SPEC 4OZ CLIKSEAL STRL BL (MISCELLANEOUS) ×12 IMPLANT
COVER BACK TABLE 80X110 HD (DRAPES) ×4 IMPLANT
COVER DOME SNAP 22 D (MISCELLANEOUS) IMPLANT
CRADLE DONUT ADULT HEAD (MISCELLANEOUS) ×4 IMPLANT
DERMABOND ADVANCED (GAUZE/BANDAGES/DRESSINGS) ×2
DERMABOND ADVANCED .7 DNX12 (GAUZE/BANDAGES/DRESSINGS) ×2 IMPLANT
DEVICE CLOSURE PERCLS PRGLD 6F (VASCULAR PRODUCTS) ×4 IMPLANT
DRAPE INCISE IOBAN 66X45 STRL (DRAPES) IMPLANT
DRSG TEGADERM 4X4.75 (GAUZE/BANDAGES/DRESSINGS) ×4 IMPLANT
ELECT CAUTERY BLADE 6.4 (BLADE) IMPLANT
ELECT REM PT RETURN 9FT ADLT (ELECTROSURGICAL) ×8
ELECTRODE REM PT RTRN 9FT ADLT (ELECTROSURGICAL) ×4 IMPLANT
FELT TEFLON 6X6 (MISCELLANEOUS) IMPLANT
FEMORAL VENOUS CANN RAP (CANNULA) IMPLANT
GAUZE SPONGE 4X4 12PLY STRL (GAUZE/BANDAGES/DRESSINGS) ×4 IMPLANT
GAUZE SPONGE 4X4 12PLY STRL LF (GAUZE/BANDAGES/DRESSINGS) ×8 IMPLANT
GLOVE BIO SURGEON STRL SZ7.5 (GLOVE) IMPLANT
GLOVE BIO SURGEON STRL SZ8 (GLOVE) ×4 IMPLANT
GLOVE BIOGEL PI IND STRL 6 (GLOVE) ×2 IMPLANT
GLOVE BIOGEL PI IND STRL 6.5 (GLOVE) ×8 IMPLANT
GLOVE BIOGEL PI IND STRL 8 (GLOVE) ×4 IMPLANT
GLOVE BIOGEL PI INDICATOR 6 (GLOVE) ×2
GLOVE BIOGEL PI INDICATOR 6.5 (GLOVE) ×8
GLOVE BIOGEL PI INDICATOR 8 (GLOVE) ×4
GLOVE EUDERMIC 7 POWDERFREE (GLOVE) ×4 IMPLANT
GLOVE ORTHO TXT STRL SZ7.5 (GLOVE) IMPLANT
GOWN STRL REUS W/ TWL LRG LVL3 (GOWN DISPOSABLE) ×6 IMPLANT
GOWN STRL REUS W/ TWL XL LVL3 (GOWN DISPOSABLE) ×8 IMPLANT
GOWN STRL REUS W/TWL LRG LVL3 (GOWN DISPOSABLE) ×6
GOWN STRL REUS W/TWL XL LVL3 (GOWN DISPOSABLE) ×8
GUIDEWIRE SAF TJ AMPL .035X180 (WIRE) ×4 IMPLANT
GUIDEWIRE SAFE TJ AMPLATZ EXST (WIRE) ×4 IMPLANT
GUIDEWIRE STRAIGHT .035 260CM (WIRE) ×4 IMPLANT
INSERT FOGARTY SM (MISCELLANEOUS) IMPLANT
KIT BASIN OR (CUSTOM PROCEDURE TRAY) ×4 IMPLANT
KIT DILATOR VASC 18G NDL (KITS) IMPLANT
KIT HEART LEFT (KITS) ×4 IMPLANT
KIT SUCTION CATH 14FR (SUCTIONS) IMPLANT
KIT TURNOVER KIT B (KITS) ×4 IMPLANT
LOOP VESSEL MAXI BLUE (MISCELLANEOUS) IMPLANT
LOOP VESSEL MINI RED (MISCELLANEOUS) IMPLANT
NEEDLE PERC 18GX7CM (NEEDLE) ×4 IMPLANT
NS IRRIG 1000ML POUR BTL (IV SOLUTION) ×4 IMPLANT
PACK ENDOVASCULAR (PACKS) ×4 IMPLANT
PAD ARMBOARD 7.5X6 YLW CONV (MISCELLANEOUS) ×8 IMPLANT
PAD ELECT DEFIB RADIOL ZOLL (MISCELLANEOUS) ×4 IMPLANT
PENCIL BUTTON HOLSTER BLD 10FT (ELECTRODE) ×4 IMPLANT
PERCLOSE PROGLIDE 6F (VASCULAR PRODUCTS) ×8
SET MICROPUNCTURE 5F STIFF (MISCELLANEOUS) ×4 IMPLANT
SHEATH BRITE TIP 6FR 35CM (SHEATH) ×4 IMPLANT
SHEATH PINNACLE 6F 10CM (SHEATH) ×4 IMPLANT
SHEATH PINNACLE 8F 10CM (SHEATH) ×4 IMPLANT
SLEEVE REPOSITIONING LENGTH 30 (MISCELLANEOUS) ×4 IMPLANT
SPONGE LAP 4X18 X RAY DECT (DISPOSABLE) IMPLANT
STOPCOCK MORSE 400PSI 3WAY (MISCELLANEOUS) ×8 IMPLANT
SUT ETHIBOND X763 2 0 SH 1 (SUTURE) IMPLANT
SUT GORETEX CV 4 TH 22 36 (SUTURE) IMPLANT
SUT GORETEX CV4 TH-18 (SUTURE) IMPLANT
SUT MNCRL AB 3-0 PS2 18 (SUTURE) IMPLANT
SUT PROLENE 5 0 C 1 36 (SUTURE) IMPLANT
SUT PROLENE 6 0 C 1 30 (SUTURE) IMPLANT
SUT SILK  1 MH (SUTURE) ×2
SUT SILK 1 MH (SUTURE) ×2 IMPLANT
SUT VIC AB 2-0 CT1 27 (SUTURE)
SUT VIC AB 2-0 CT1 TAPERPNT 27 (SUTURE) IMPLANT
SUT VIC AB 2-0 CTX 36 (SUTURE) IMPLANT
SUT VIC AB 3-0 SH 8-18 (SUTURE) IMPLANT
SYR 50ML LL SCALE MARK (SYRINGE) ×4 IMPLANT
SYR BULB IRRIGATION 50ML (SYRINGE) IMPLANT
SYR CONTROL 10ML LL (SYRINGE) IMPLANT
TAPE CLOTH SURG 4X10 WHT LF (GAUZE/BANDAGES/DRESSINGS) ×4 IMPLANT
TOWEL GREEN STERILE (TOWEL DISPOSABLE) ×8 IMPLANT
TRANSDUCER W/STOPCOCK (MISCELLANEOUS) ×8 IMPLANT
TRAY FOLEY SLVR 14FR TEMP STAT (SET/KITS/TRAYS/PACK) IMPLANT
TUBE SUCT INTRACARD DLP 20F (MISCELLANEOUS) IMPLANT
VALVE HEART TRANSCATH SZ3 26MM (Prosthesis & Implant Heart) ×4 IMPLANT
WIRE .035 3MM-J 145CM (WIRE) ×4 IMPLANT
WIRE BENTSON .035X145CM (WIRE) ×4 IMPLANT

## 2017-05-09 NOTE — Progress Notes (Signed)
Order for sheath removal verified per post procedural orders. Procedure explained to patient and 38F artery access site and 38F venous site assessed: level 0, palpable dorsalis pedis. Two 6 French Sheaths removed and manual pressure applied for 20 minutes. Pre, peri, & post procedural vitals: HR 60, RR 16, O2 Sat 100%, BP 139/60, Pain level 0. Distal pulses remained intact after sheath removal. Access site level 0 and dressed with 4X4 gauze and tegaderm.  Heather, RN confirmed condition of site. Post procedural instructions discussed with attending nurse.

## 2017-05-09 NOTE — Progress Notes (Signed)
[facility]   MULTIDISCIPLINARY HEART VALVE TEAM   TAVR OPERATIVE NOTE  Date of Procedure:  05/09/2017  Preoperative Diagnosis: Severe Aortic Stenosis   Postoperative Diagnosis: Same   Procedure:   Transcatheter Aortic Valve Replacement - Percutaneous  Transfemoral Approach  Edwards Sapien 3 THV (size 26 mm, model # 9600TFX, serial # [id number])   Co-Surgeons:  Gaye Pollack, MD and Sherren Mocha, MD  Anesthesiologist:  Dr Tobias Alexander  Echocardiographer:  Dr Meda Coffee  Pre-operative Echo Findings:  Severe aortic stenosis  Mild left ventricular systolic dysfunction  Post-operative Echo Findings:  Trace paravalvular leak  unchanged left ventricular systolic function  BRIEF CLINICAL NOTE AND INDICATIONS FOR SURGERY  77 yo male with CAD, s/p CABG 2005, permanent atrial fibrillation, recent permanent pacemaker placement, and acute on chronic combined systolic and diastolic heart failure, presenting today for elective TAVR for treatment of severe, Stage D1, aortic stenosis. At the time of our evaluation this morning, the patient reports progressive shortness of breath, even worse over the past 48-72 hours.   During the course of the patient's preoperative work up they have been evaluated comprehensively by a multidisciplinary team of specialists coordinated through the [facility] in the [facility].  They have been demonstrated to suffer from symptomatic severe aortic stenosis as noted above. The patient has been counseled extensively as to the relative risks and benefits of all options for the treatment of severe aortic stenosis including long term medical therapy, conventional surgery for aortic valve replacement, and transcatheter aortic valve replacement.  The patient has been independently evaluated by two cardiac surgeons including Dr. Roxy Manns and Dr. Cyndia Bent, and they are felt to be at high risk for conventional surgical  aortic valve replacement based upon a predicted risk of mortality using the Society of Thoracic Surgeons risk calculator of 10.3%. Both surgeons indicated the patient would be a poor candidate for conventional surgery.   Based upon review of all of the patient's preoperative diagnostic tests they are felt to be candidate for transcatheter aortic valve replacement using the transfemoral approach as an alternative to high risk conventional surgery.    Following the decision to proceed with transcatheter aortic valve replacement, a discussion has been held regarding what types of management strategies would be attempted intraoperatively in the event of life-threatening complications, including whether or not the patient would be considered a candidate for the use of cardiopulmonary bypass and/or conversion to open sternotomy for attempted surgical intervention.  The patient has been advised of a variety of complications that might develop peculiar to this approach including but not limited to risks of death, stroke, paravalvular leak, aortic dissection or other major vascular complications, aortic annulus rupture, device embolization, cardiac rupture or perforation, acute myocardial infarction, arrhythmia, heart block or bradycardia requiring permanent pacemaker placement, congestive heart failure, respiratory failure, renal failure, pneumonia, infection, other late complications related to structural valve deterioration or migration, or other complications that might ultimately cause a temporary or permanent loss of functional independence or other long term morbidity.  The patient provides full informed consent for the procedure as described and all questions were answered preoperatively.  DETAILS OF THE OPERATIVE PROCEDURE  PREPARATION:   The patient is brought to the operating room on the above mentioned date and central monitoring was established by the anesthesia team including placement of a central  venous catheter and radial arterial line. The patient is placed in the supine position on the operating table.  Intravenous antibiotics are administered. The patient is monitored closely throughout the procedure under conscious sedation. A Foley catheter is placed.  Baseline transthoracic echocardiogram is performed. The patient's chest, abdomen, both groins, and both lower extremities are prepared and draped in a sterile manner. A time out procedure is performed.   PERIPHERAL ACCESS:   Using ultrasound guidance, femoral arterial and venous access is obtained with placement of 6 Fr sheaths on the left side.  A pigtail diagnostic catheter was passed through the femoral arterial sheath under fluoroscopic guidance into the aortic root.  A temporary transvenous pacemaker catheter was passed through the femoral venous sheath under fluoroscopic guidance into the right ventricle.  The pacemaker was tested to ensure stable lead placement and pacemaker capture. Aortic root angiography was performed in order to determine the optimal angiographic angle for valve deployment.  TRANSFEMORAL ACCESS:  A micropuncture technique is used to access the right femoral artery under fluoroscopic and ultrasound guidance.  2 Perclose devices are deployed at 10' and 2' positions to 'PreClose' the femoral artery. An 8 French sheath is placed and then an Amplatz Superstiff wire is advanced through the sheath. This is changed out for a 16 French transfemoral E-Sheath after progressively dilating over the Superstiff wire.  An AL-2 catheter was used to direct a straight-tip exchange length wire across the native aortic valve into the left ventricle. This was exchanged out for a pigtail catheter and position was confirmed in the LV apex. Simultaneous LV and Ao pressures were recorded.  The pigtail catheter was exchanged for an Amplatz Extra-stiff wire in the LV apex.    BALLOON AORTIC VALVULOPLASTY:  Not performed  TRANSCATHETER HEART  VALVE DEPLOYMENT:  An Edwards Sapien 3 transcatheter heart valve (size 26 mm, model #9600TFX, serial #[id number]) was prepared and crimped per manufacturer's guidelines, and the proper orientation of the valve is confirmed on the [employer] delivery system. The valve was advanced through the introducer sheath using normal technique until in an appropriate position in the abdominal aorta beyond the sheath tip. The balloon was then retracted and using the fine-tuning wheel was centered on the valve. The valve was then advanced across the aortic arch using appropriate flexion of the catheter. The valve was carefully positioned across the aortic valve annulus. The Commander catheter was retracted using normal technique. Once final position of the valve has been confirmed by angiographic assessment, the valve is deployed while temporarily holding ventilation and during rapid ventricular pacing to maintain systolic blood pressure < 50 mmHg and pulse pressure < 10 mmHg. The balloon inflation is held for >3 seconds after reaching full deployment volume. Once the balloon has fully deflated the balloon is retracted into the ascending aorta and valve function is assessed using echocardiography. There is felt to be trace paravalvular leak and no central aortic insufficiency.  The patient's hemodynamic recovery following valve deployment is good.  The deployment balloon and guidewire are both removed. Echo demostrated acceptable post-procedural gradients, stable mitral valve function, and trace aortic insufficiency.   PROCEDURE COMPLETION:  The sheath was removed and femoral artery closure is performed using the 2 previously deployed Perclose devices.  Protamine is administered once femoral arterial repair was complete. The site is clear with no evidence of bleeding or hematoma after the sutures are tightened. The temporary pacemaker, pigtail catheters and femoral sheaths were removed with manual pressure used for  hemostasis.   The patient tolerated the procedure well and is transported to the surgical intensive care in stable condition.  There were no immediate intraoperative complications. All sponge instrument and needle counts are verified correct at completion of the operation.   The patient received a total of 40 mL of intravenous contrast during the procedure.   Sherren Mocha, MD 05/09/2017 1:11 PM

## 2017-05-09 NOTE — Interval H&P Note (Signed)
History and Physical Interval Note:  05/09/2017 9:40 AM  Evan [name]  has presented today for surgery, with the diagnosis of severe aortic stenosis  The various methods of treatment have been discussed with the patient and family. After consideration of risks, benefits and other options for treatment, the patient has consented to  Procedure(s): TRANSCATHETER AORTIC VALVE REPLACEMENT, TRANSFEMORAL (N/A) TRANSESOPHAGEAL ECHOCARDIOGRAM (TEE) (N/A) as a surgical intervention .  The patient's history has been reviewed, patient examined, no change in status, stable for surgery.  I have reviewed the patient's chart and labs.  Questions were answered to the patient's satisfaction.     Gaye Pollack

## 2017-05-09 NOTE — Op Note (Signed)
[facility]   MULTIDISCIPLINARY HEART VALVE TEAM   TAVR OPERATIVE NOTE   Date of Procedure:  05/09/2017  Preoperative Diagnosis: Severe Aortic Stenosis   Postoperative Diagnosis: Same   Procedure:    Transcatheter Aortic Valve Replacement - Percutaneous Right Transfemoral Approach  Edwards Sapien 3 THV (size 26 mm, model # 9600TFX, serial # [id number])   Co-Surgeons:  Gaye Pollack, MD and Sherren Mocha, MD    Anesthesiologist:  Tamela Gammon, MD  Echocardiographer:  Ena Dawley, MD  Pre-operative Echo Findings:  Severe aortic stenosis  Mild left ventricular systolic  dysfunction  Post-operative Echo Findings:  Trace paravalvular leak  Mild left ventricular systolic dysfunction   BRIEF CLINICAL NOTE AND INDICATIONS FOR SURGERY  This 77 year old gentleman has stage D, severe, symptomatic aortic stenosis with New York Heart Association class II symptoms of exertional fatigue and shortness of breath consistent with chronic diastolic heart failure. His shortness of breath has improved some following permanent pacemaker implantation for persistent atrial fibrillation with a slow ventricular response. I have personally reviewed his echocardiogram, cardiac catheterization, and CTA studies. His echocardiogram shows a trileaflet aortic valve that is severely calcified with restricted leaflet mobility. The mean gradient is 38 mmHg with a dimensionless index of 0.19 consistent with severe aortic stenosis. Cardiac catheterization shows severe diffuse multivessel coronary disease with patent bypass grafts. The radial artery graft to the obtuse marginal branch is fairly small with a tight stenosis at the distal anastomosis but the obtuse marginal was a small vessel. He has no anginal symptoms and I do not think there is any need for coronary revascularization. I agree that aortic valve replacement is indicated in this patient for symptom relief and to prevent progressive  left ventricular deterioration. His operative risk for open surgical aortic valve replacement would be at least moderately elevated due to his age and reduced status as well as diabetes, stage II chronic kidney disease and obesity. I think transcatheter aortic valve replacement would be the best option for him. His gated cardiac CTA shows anatomy suitable for transcatheter aortic valve replacement using a 26 mm Sapien 3 valve. There is severe left atrial enlargement with layering of contrast in the left atrial appendage and we cannot rule out left atrial appendage thrombus. His abdominal and pelvic CTA shows significant aortoiliac and femoral atherosclerosis but I think there is adequate pelvic vasculature to allow right transfemoral insertion. His left common femoral artery has significant calcific plaque and is nearly completely occluded by think we can probably access this vessel for pigtail catheter insertion above this area.   The patient and his wife were counseled at length regarding treatment alternatives for management of severe symptomatic aortic stenosis. The risks and benefits of surgical intervention has been discussed in detail. Long-term prognosis with medical therapy was discussed. Alternative approaches such as conventional surgical aortic valve replacement, transcatheter aortic valve replacement, and palliative medical therapy were compared and contrasted at length. This discussion was placed in the context of the patient's own specific clinical presentation and past medical history. All of their questions been addressed.   A discussion was held regarding what types of management strategies would be attempted intraoperatively in the event of life-threatening complications, including whether or not the patient would be considered a candidate for the use of cardiopulmonary bypass and/or conversion to open sternotomy for attempted surgical intervention. The patient has been advised of a variety  of complications that might develop including but not limited to risks of death, stroke,  paravalvular leak, aortic dissection or other major vascular complications, aortic annulus rupture, device embolization, cardiac rupture or perforation, mitral regurgitation, acute myocardial infarction, arrhythmia, heart block or bradycardia requiring permanent pacemaker placement, congestive heart failure, respiratory failure, renal failure, pneumonia, infection, other late complications related to structural valve deterioration or migration, or other complications that might ultimately cause a temporary or permanent loss of functional independence or other long term morbidity. The patient provides full informed consent for the procedure as described and all questions were answered.     DETAILS OF THE OPERATIVE PROCEDURE  PREPARATION:    The patient is brought to the operating room on the above mentioned date and central monitoring was established by the anesthesia team including placement of a central venous line and radial arterial line. The patient is placed in the supine position on the operating table.  Intravenous antibiotics are administered. The patient is monitored closely throughout the procedure under conscious sedation. A Foley catheter is placed.  Baseline transthoracic echocardiogram was performed. The patient's chest, abdomen, both groins, and both lower extremities are prepared and draped in a sterile manner. A time out procedure is performed.   PERIPHERAL ACCESS:    Using the modified Seldinger technique, femoral arterial and venous access was obtained with placement of 6 Fr sheaths on the left side.  A pigtail diagnostic catheter was passed through the left arterial sheath under fluoroscopic guidance into the aortic root.  A temporary transvenous pacemaker catheter was passed through the left femoral venous sheath under fluoroscopic guidance into the right ventricle.  The pacemaker was tested  to ensure stable lead placement and pacemaker capture. Aortic root angiography was performed in order to determine the optimal angiographic angle for valve deployment.   TRANSFEMORAL ACCESS:   Percutaneous transfemoral access and sheath placement was performed  using ultrasound guidance.  The right common femoral artery was cannulated using a micropuncture needle and appropriate location was verified using hand injection angiogram.  A pair of Abbott Perclose percutaneous closure devices were placed and a 6 French sheath replaced into the femoral artery.  The patient was heparinized systemically and ACT verified > 250 seconds.    A 14 Fr transfemoral E-sheath was introduced into the right femoral artery after progressively dilating over an Amplatz superstiff wire. An AL-2 catheter was used to direct a straight-tip exchange length wire across the native aortic valve into the left ventricle. This was exchanged out for a pigtail catheter and position was confirmed in the LV apex. Simultaneous LV and Ao pressures were recorded.  The pigtail catheter was exchanged for an Amplatz Extra-stiff wire in the LV apex.  Echocardiography was utilized to confirm appropriate wire position and no sign of entanglement in the mitral subvalvular apparatus.   BALLOON AORTIC VALVULOPLASTY:   Not performed   TRANSCATHETER HEART VALVE DEPLOYMENT:   An Edwards Sapien 3 transcatheter heart valve (size 26 mm, model #9600TFX, serial #[id number]) was prepared and crimped per manufacturer's guidelines, and the proper orientation of the valve is confirmed on the [employer] delivery system. The valve was advanced through the introducer sheath using normal technique until in an appropriate position in the abdominal aorta beyond the sheath tip. The balloon was then retracted and using the fine-tuning wheel was centered on the valve. The valve was then advanced across the aortic arch using appropriate flexion of the catheter. The  valve was carefully positioned across the aortic valve annulus. The Commander catheter was retracted using normal technique. Once final position of the  valve has been confirmed by angiographic assessment, the valve is deployed while temporarily holding ventilation and during rapid ventricular pacing to maintain systolic blood pressure < 50 mmHg and pulse pressure < 10 mmHg. The balloon inflation is held for >3 seconds after reaching full deployment volume. Once the balloon has fully deflated the balloon is retracted into the ascending aorta and valve function is assessed using echocardiography. There is felt to be trivial paravalvular leak and no central aortic insufficiency.  The patient's hemodynamic recovery following valve deployment is good.  The deployment balloon and guidewire are both removed.    PROCEDURE COMPLETION:   The sheath was removed and femoral artery closure performed using the previously placed Perclose devices.  Protamine was administered once femoral arterial repair was complete. The temporary pacemaker, pigtail catheters and femoral sheaths were removed with manual pressure used for hemostasis.   The patient tolerated the procedure well and is transported to the surgical intensive care in stable condition. There were no immediate intraoperative complications. All sponge instrument and needle counts are verified correct at completion of the operation.   No blood products were administered during the operation.  The patient received a total of 40 mL of intravenous contrast during the procedure.   Gaye Pollack, MD 05/09/2017 5:12 PM

## 2017-05-09 NOTE — Progress Notes (Signed)
[facility] BRIEF SICU PROGRESS NOTE  Day of Surgery  S/P Procedure(s) (LRB): TRANSCATHETER AORTIC VALVE REPLACEMENT, TRANSFEMORAL using an Edwards 51mm Sapien 3 Aortic Valve (N/A) TRANSESOPHAGEAL ECHOCARDIOGRAM (TEE) (N/A)   Doing well Up in chair with no complaints Rhythm and BP stable Both groins okay  Plan: Continue routine post TAVR  Rexene Alberts, MD 05/09/2017 8:26 PM

## 2017-05-09 NOTE — Progress Notes (Signed)
  Echocardiogram 2D Echocardiogram has been performed.  Bobbye Charleston 05/09/2017, 12:30 PM

## 2017-05-09 NOTE — Transfer of Care (Signed)
Immediate Anesthesia Transfer of Care Note  Patient: Evan [name]  Procedure(s) Performed: TRANSCATHETER AORTIC VALVE REPLACEMENT, TRANSFEMORAL using an Edwards 32m Sapien 3 Aortic Valve (N/A Chest) TRANSESOPHAGEAL ECHOCARDIOGRAM (TEE) (N/A )  Patient Location: SICU  Anesthesia Type:MAC  Level of Consciousness: drowsy  Airway & Oxygen Therapy: Patient Spontanous Breathing and Patient connected to face mask oxygen  Post-op Assessment: Report given to RN, Post -op Vital signs reviewed and stable and Patient moving all extremities  Post vital signs: Reviewed and stable  Last Vitals:  Vitals Value Taken Time  BP 139/63 05/09/2017 12:50 PM  Temp    Pulse 65 05/09/2017 12:54 PM  Resp 21 05/09/2017 12:54 PM  SpO2 99 % 05/09/2017 12:54 PM  Vitals shown include unvalidated device data.  Last Pain:  Vitals:   05/09/17 0759  TempSrc: Oral  PainSc:       Patients Stated Pain Goal: 2 ([id number]  Complications: No apparent anesthesia complications

## 2017-05-09 NOTE — H&P (Signed)
[address]       [place] [address]             [phone]      Cardiothoracic Surgery Admission History and Physical   Referring Provider is [name], [name] M, MD  PCP is Leonel Ramsay, MD      Chief Complaint  Patient presents with  . Aortic Stenosis       HPI:   The patient is a 77 year old gentleman with a history of hypertension, hyperlipidemia, type 2 diabetes, permanent atrial fibrillation with a slow ventricular rate status post recent permanent pacemaker, carotid artery disease status post left carotid endarterectomy, history of stroke in 2017 with hemorrhagic transformation, history of GI bleeding, history of coronary artery disease status post coronary artery bypass graft surgery x3 at [facility] in 2005, and aortic stenosis. He reports a 6-58-month history of progressive shortness of breath and exertional fatigue. This was occurring with low level activity like getting dressed and walking short distances. He had a 24-hour Holter monitor performed on 02/15/2017 which showed atrial fibrillation with a slow ventricular response and frequent pauses up to 5.08 seconds. An echocardiogram on 03/01/2017 showed severe aortic stenosis with a mean gradient of 38 mmHg and a valve area of 0.64 cm. The dimensionless index was 0.19. Left ventricular ejection fraction was 55-60%. Cardiac catheterization on 03/23/2017 showed severe native three-vessel coronary disease with total occlusion of the right coronary artery, severe left main coronary stenosis, and total occlusion of the LAD. There was severe stenosis of the left circumflex and its branch vessels. He had continued patency of the left internal mammary graft to the LAD, continued patency of the free radial artery graft to the obtuse marginal branch with a tight stenosis at the distal anastomosis involving a small caliber vessel. There is continued patency of the saphenous vein graft to the posterior descending artery.  The mean aortic valve gradient was 40 mmHg. PA pressure was markedly elevated at 72/25 with a mean of 38. He was evaluated for conduction disease with atrial fibrillation with a slow ventricular response and trifascicular block with a heart rate that he said was in the 40s-50s. He underwent permanent dual-chamber pacemaker implantation by Dr. Curt Bears on 03/24/2017. He said that he noticed significant improvement in his shortness of breath after pacemaker implantation and his heart rate has been consistently in the 60s. He is here with his wife today who said that she feels like he has better since pacemaker implantation but is still limited by exertional shortness of breath and fatigue. He denies any chest pain or pressure. He denies dizziness and syncope. He denies orthopnea and PND. He has had mild edema in his lower legs and ankles.       Past Medical History:  Diagnosis Date  . Anemia   . Carotid arterial disease ([place])   . CHF (congestive heart failure) ([place])   . Coronary artery disease   . HOH (hard of hearing)   . Hyperlipidemia   . Hypertension   . Nocturia   . Obesity   . Osteoarthritis    "BUE; BLE; right knee" (03/23/2017)  . Peripheral vision loss, bilateral    S/P 07/2015  . Permanent atrial fibrillation ([place])   . 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 Severe aortic stenosis   . Sinus drainage   . Stroke [facility]) 07/2015   "peripheral vision still bad out of left eye" (03/23/2017)  . Torn rotator  cuff 2012   Right arm  . Type II diabetes mellitus ([place])   . Urinary frequency         Past Surgical History:  Procedure Laterality Date  . BACK SURGERY    . CARDIAC CATHETERIZATION  11/29/2005   SEVERE THREE VESSEL OBSTRUCTIVE ATHERSCLEROTIC CAD. ALL GRAFTS WERE PATENT. EF 65%  . CARDIAC CATHETERIZATION  03/23/2017  . CAROTID ENDARTERECTOMY Right 01-29-08   cea  . CARPAL TUNNEL RELEASE Right   . CORONARY ARTERY BYPASS GRAFT  03/2003   X3. LIMA GRAFT TO THE LAD,  SAPHENOUS VEIN GRAFT TO THE PA, AND A LEFT RADIAL GRAFT TO THEOBTUSE MARGINAL VESSEL  . ENDARTERECTOMY Left 05/28/2012   Procedure: ENDARTERECTOMY CAROTID; Surgeon: Elam Dutch, MD; Location: [facility]; Service: Vascular; Laterality: Left;  . INSERT / REPLACE / REMOVE PACEMAKER  03/23/2017  . [place]; 1984  . MOLE SURGERY     "? face/arms"  . PACEMAKER IMPLANT N/A 03/23/2017   Procedure: PACEMAKER IMPLANT; Surgeon: Constance Haw, MD; Location: [place] CV [facility]; Service: Cardiovascular; Laterality: N/A;  . PATCH ANGIOPLASTY Left 05/28/2012   Procedure: WITH dACRON PATCH ANGIOPLASTY; Surgeon: Elam Dutch, MD; Location: [place]; Service: Vascular; Laterality: Left;  . RIGHT/LEFT HEART CATH AND CORONARY/GRAFT ANGIOGRAPHY N/A 03/23/2017   Procedure: RIGHT/LEFT HEART CATH AND CORONARY/GRAFT ANGIOGRAPHY; Surgeon: Sherren Mocha, MD; Location: [place] [facility]; Service: Cardiovascular; Laterality: N/A;        Family History  Problem Relation Age of Onset  . Heart attack Father   . Heart attack Brother   . Parkinsonism Brother   . Cancer Brother   . Arrhythmia Brother    Social History        Socioeconomic History  . Marital status: Married    Spouse name: Not on file  . Number of children: 5  . Years of education: Not on file  . Highest education level: Not on file  Social Needs  . Financial resource strain: Not on file  . Food insecurity - worry: Not on file  . Food insecurity - inability: Not on file  . Transportation needs - medical: Not on file  . Transportation needs - non-medical: Not on file  Occupational History  . Occupation: retired    Fish farm manager: RETIRED  Tobacco Use  . Smoking status: Former Smoker    Packs/day: 2.00    Years: 39.00    Pack years: 78.00    Types: Cigarettes    Last attempt to quit: 01/24/1994    Years since quitting: 23.2  . Smokeless tobacco: Never Used  Substance and Sexual Activity  . Alcohol use: No  . Drug use: No    . Sexual activity: No  Other Topics Concern  . Not on file  Social History Narrative  . Not on file         Current Outpatient Medications  Medication Sig Dispense Refill  . acetaminophen (TYLENOL) 650 MG CR tablet Take 1,300 mg by mouth every 8 (eight) hours as needed for pain.    Marland Kitchen amLODipine (NORVASC) 2.5 MG tablet Take 2.5 mg by mouth daily.    Marland Kitchen apixaban (ELIQUIS) 5 MG TABS tablet Take 1 tablet (5 mg total) by mouth 2 (two) times daily. 28 tablet 0  . benazepril (LOTENSIN) 40 MG tablet Take 1 tablet (40 mg total) by mouth daily. 90 tablet 3  . Cyanocobalamin (RA VITAMIN B-12 TR) 1000 MCG TBCR Take 1,000 mcg by mouth daily.     Marland Kitchen  ezetimibe (ZETIA) 10 MG tablet Take 1 tablet (10 mg total) by mouth daily. (Patient taking differently: Take 10 mg by mouth at bedtime. ) 30 tablet 11  . fluticasone (FLONASE) 50 MCG/ACT nasal spray Place 2 sprays into both nostrils daily.    Marland Kitchen glimepiride (AMARYL) 2 MG tablet Take 2 mg by mouth daily with breakfast.    . hydrochlorothiazide (HYDRODIURIL) 25 MG tablet Take 1 tablet (25 mg total) by mouth every morning. 90 tablet 1  . OVER THE COUNTER MEDICATION Replenex tablets: Take 3 tablet by mouth every morning (joint health)    . Polyvinyl Alcohol-Povidone (REFRESH OP) Place 2 drops into both eyes daily as needed (for dry eyes).     . rosuvastatin (CRESTOR) 20 MG tablet Take 1 tablet (20 mg total) by mouth daily. (Patient taking differently: Take 20 mg by mouth every evening. ) 30 tablet 0  . tamsulosin (FLOMAX) 0.4 MG CAPS capsule TAKE 1 CAPSULE BY MOUTH ONCE DAILY (Patient taking differently: TAKE 0.4 MG BY MOUTH ONCE DAILY IN THE EVENING) 90 capsule 3   No current facility-administered medications for this visit.         Allergies  Allergen Reactions  . Niacin And Related Rash and Other (See Comments)    Red face and burns.  . Metformin And Related Diarrhea  . Statins Other (See Comments)    Myalgias, weakness   Review of Systems:   General:  normal appetite, decreased energy, no weight gain, no weight loss, no fever  Cardiac: no chest pain with exertion, no chest pain at rest, + SOB with mild exertion, no resting SOB, no PND, no orthopnea, no palpitations, + arrhythmia, + atrial fibrillation, + LE edema, no dizzy spells, no syncope  Respiratory: exertional shortness of breath, no home oxygen, no productive cough, no dry cough, no bronchitis, no wheezing, no hemoptysis, no asthma, no pain with inspiration or cough, no sleep apnea, no CPAP at night  GI: no difficulty swallowing, no reflux, no frequent heartburn, no hiatal hernia, no abdominal pain, no constipation, no diarrhea, no hematochezia, no hematemesis, no melena  GU: no dysuria, + frequency, no urinary tract infection, no hematuria, no enlarged prostate, no kidney stones, no kidney disease  Vascular: + pain suggestive of claudication, no pain in feet, no leg cramps, no varicose veins, no DVT, no non-healing foot ulcer  Neuro: + stroke, no TIA's, no seizures, no headaches, no temporary blindness one eye, no slurred speech, no peripheral neuropathy, no chronic pain, no instability of gait, no memory/cognitive dysfunction  Musculoskeletal: + arthritis, + joint swelling, + myalgias, no difficulty walking, normal mobility  Skin: no rash, no itching, no skin infections, no pressure sores or ulcerations  Psych: no anxiety, + depression, no nervousness, no unusual recent stress  Eyes: no blurry vision, no floaters, + recent vision changes, + wears glasses or contacts  ENT: + hearing loss, no loose or painful teeth, + dentures  Hematologic: + easy bruising, no abnormal bleeding, no clotting disorder, no frequent epistaxis  Endocrine: + diabetes, does check CBG's at home  Physical Exam:  BP (!) 148/71 (BP Location: Left Arm, Patient Position: Sitting, Cuff Size: Large)  Pulse 63  Resp 20  Ht 6' (1.829 m)  Wt 268 lb (121.6 kg)  SpO2 93% Comment: RA  BMI 36.35 kg/m  General: Obese but  well-appearing  HEENT: Unremarkable, NCAT, PERLA, EOMI, oropharynx clear  Neck: no JVD, no bruits, no adenopathy or thyromegaly  Chest: clear to auscultation, symmetrical breath  sounds, no wheezes, no rhonchi  CV: RRR, grade III/VI crescendo/decrescendo murmur heard best at RSB, no diastolic murmur  Abdomen: soft, non-tender, no masses or organomegaly  Extremities: warm, well-perfused, pulses diminished in feet, mild LE edema  Rectal/GU Deferred  Neuro: Grossly non-focal and symmetrical throughout  Skin: Clean and dry, no rashes, no breakdown    Diagnostic Tests:   [facility]  [address], [place] [address]  [phone]  -------------------------------------------------------------------  Transthoracic Echocardiography  Patient: Quintyn, Dombek  MR #: [id number]  Study Date: 03/01/2017  Gender: M  Age: 73  Height: 182.9 cm  Weight: 121.6 kg  BSA: 2.53 m^2  Pt. Status:  Room:  SONOGRAPHER Diamond Nickel  ATTENDING Loralie Champagne, M.D.  PERFORMING [facility], Outpatient  ORDERING Will Camnitz, MD  [place], MD  cc:  -------------------------------------------------------------------  LV EF: 55% - 60%  -------------------------------------------------------------------  Indications: I48.2 Chronic atrial fibrillation. I35.0  Non-rheumatic aortic valve stenosis.  -------------------------------------------------------------------  History: PMH: Obesity. Dyspnea and murmur. Coronary artery  disease. Stroke. Risk factors: Hypertension. Diabetes mellitus.  Dyslipidemia.  -------------------------------------------------------------------  Study Conclusions  - Left ventricle: The cavity size was normal. Wall thickness was  increased in a pattern of severe LVH. Systolic function was  normal. The estimated ejection fraction was in the range of 55%  to 60%. Indeterminant diastolic function (atrial fibrillation).  Wall motion was normal; there were  no regional wall motion  abnormalities.  - Aortic valve: Probably trileaflet; severely calcified leaflets.  There was severe stenosis. Mean gradient (S): 38 mm Hg. Valve  area (VTI): 0.64 cm^2.  - Mitral valve: Moderately calcified annulus. There was trivial  regurgitation.  - Left atrium: The atrium was severely dilated.  - Right ventricle: The cavity size was normal. Systolic function  was mildly to moderately reduced.  - Right atrium: The atrium was mildly dilated.  - Pulmonary arteries: No complete TR doppler jet so unable to  estimate PA systolic pressure.  - Systemic veins: IVC measured 2.3 cm with < 50% respirophasic  variation, suggesting RA pressure 15 mmHg.  Impressions:  - The patient was in atrial fibrillation. Normal LV size with  severe LV hypertrophy. EF 55-60%. Normal RV size with mild to  moderately decreased systolic function. Aortic stenosis appeared  severe. Mean gradient was not quite to severe range but visually  and by calculated AVA, there was severe aortic stenosis.  -------------------------------------------------------------------  Study data: Comparison was made to the study of 08/25/2015. Study  status: Routine. Procedure: The patient reported no pain pre or  post test. Transthoracic echocardiography. Image quality was  adequate. The study was technically difficult, as a result of poor  sound wave transmission and body habitus. Intravenous contrast  (Definity) was administered. Study completion: There were no  complications. Transthoracic echocardiography. M-mode,  complete 2D, spectral Doppler, and color Doppler. Birthdate:  Patient birthdate: 06-30-1940. Age: Patient is 77 yr old. Sex:  Gender: male. BMI: 36.3 kg/m^2. Blood pressure: 174/78  Patient status: Outpatient. Study date: Study date: 03/01/2017.  Study time: 10:50 AM. Location: [facility]  -------------------------------------------------------------------    -------------------------------------------------------------------  Left ventricle: The cavity size was normal. Wall thickness was  increased in a pattern of severe LVH. Systolic function was normal.  The estimated ejection fraction was in the range of 55% to 60%.  Indeterminant diastolic function (atrial fibrillation). Wall motion  was normal; there were no regional wall motion abnormalities.  -------------------------------------------------------------------  Aortic valve: Probably trileaflet; severely calcified  leaflets.  Doppler: There was severe stenosis. There was no regurgitation.  VTI ratio of LVOT to aortic valve: 0.2. Valve area (VTI): 0.64  cm^2. Indexed valve area (VTI): 0.25 cm^2/m^2. Peak velocity ratio  of LVOT to aortic valve: 0.19. Valve area (Vmax): 0.6 cm^2. Indexed  valve area (Vmax): 0.24 cm^2/m^2. Mean velocity ratio of LVOT to  aortic valve: 0.2. Valve area (Vmean): 0.63 cm^2. Indexed valve  area (Vmean): 0.25 cm^2/m^2. Mean gradient (S): 38 mm Hg. Peak  gradient (S): 73 mm Hg.  -------------------------------------------------------------------  Aorta: Aortic root: The aortic root was normal in size.  Ascending aorta: The ascending aorta was normal in size.  -------------------------------------------------------------------  Mitral valve: Moderately calcified annulus. Doppler: There was  no evidence for stenosis. There was trivial regurgitation.  -------------------------------------------------------------------  Left atrium: The atrium was severely dilated.  -------------------------------------------------------------------  Right ventricle: The cavity size was normal. Systolic function was  mildly to moderately reduced.  -------------------------------------------------------------------  Pulmonic valve: Structurally normal valve. Cusp separation was  normal. Doppler: Transvalvular velocity was within the normal  range. There was no regurgitation.   -------------------------------------------------------------------  Tricuspid valve: Doppler: There was no significant  regurgitation.  -------------------------------------------------------------------  Pulmonary artery: No complete TR doppler jet so unable to  estimate PA systolic pressure.  -------------------------------------------------------------------  Right atrium: The atrium was mildly dilated.  -------------------------------------------------------------------  Pericardium: There was no pericardial effusion.  -------------------------------------------------------------------  Systemic veins: IVC measured 2.3 cm with < 50% respirophasic  variation, suggesting RA pressure 15 mmHg.  -------------------------------------------------------------------  Measurements  Left ventricle Value Reference  LV ID, ED, PLAX chordal 45 mm 43 - 52  LV ID, ES, PLAX chordal 34 mm 23 - 38  LV fx shortening, PLAX chordal (L) 24 % >=29  LV PW thickness, ED 17 mm ----------  IVS/LV PW ratio, ED 1.24 <=1.3  Stroke volume, 2D 64 ml ----------  Stroke volume/bsa, 2D 25 ml/m^2 ----------  Ventricular septum Value Reference  IVS thickness, ED 21 mm ----------  LVOT Value Reference  LVOT ID, S 20 mm ----------  LVOT area 3.14 cm^2 ----------  LVOT peak velocity, S 81.5 cm/s ----------  LVOT mean velocity, S 54.8 cm/s ----------  LVOT VTI, S 20.3 cm ----------  Aortic valve Value Reference  Aortic valve peak velocity, S 427 cm/s ----------  Aortic valve mean velocity, S 271 cm/s ----------  Aortic valve VTI, S 99.7 cm ----------  Aortic mean gradient, S 35 mm Hg ----------  Aortic peak gradient, S 73 mm Hg ----------  VTI ratio, LVOT/AV 0.2 ----------  Aortic valve area, VTI 0.64 cm^2 ----------  Aortic valve area/bsa, VTI 0.25 cm^2/m^2 ----------  Velocity ratio, peak, LVOT/AV 0.19 ----------  Aortic valve area, peak velocity 0.6 cm^2 ----------  Aortic valve area/bsa, peak 0.24  cm^2/m^2 ----------  velocity  Velocity ratio, mean, LVOT/AV 0.2 ----------  Aortic valve area, mean velocity 0.63 cm^2 ----------  Aortic valve area/bsa, mean 0.25 cm^2/m^2 ----------  velocity  Aorta Value Reference  Aortic root ID, ED 34 mm ----------  Left atrium Value Reference  LA ID, A-P, ES 51 mm ----------  LA ID/bsa, A-P 2.02 cm/m^2 <=2.2  LA volume, S 133 ml ----------  LA volume/bsa, S 52.6 ml/m^2 ----------  LA volume, ES, 1-p A4C 144 ml ----------  LA volume/bsa, ES, 1-p A4C 56.9 ml/m^2 ----------  LA volume, ES, 1-p A2C 119 ml ----------  LA volume/bsa, ES, 1-p A2C 47.1 ml/m^2 ----------  Right atrium Value Reference  RA ID, S-I, ES, A4C (H) 56.6 mm 34 -  49  RA area, ES, A4C (H) 21.3 cm^2 8.3 - 19.5  RA volume, ES, A/L 68.5 ml ----------  RA volume/bsa, ES, A/L 27.1 ml/m^2 ----------  Legend:  (L) and (H) mark values outside specified reference range.  -------------------------------------------------------------------  Prepared and Electronically Authenticated by  Loralie Champagne, M.D.  2019-02-06T15:51:49     Physicians  Panel Physicians Referring Physician Case Authorizing Physician  Sherren Mocha, MD (Primary)    Procedures  RIGHT/LEFT HEART CATH AND CORONARY/GRAFT ANGIOGRAPHY  Conclusion  Mid RCA lesion is 100% stenosed.  Ost LM to Dist LM lesion is 70% stenosed.  Prox Cx lesion is 90% stenosed.  Ost 2nd Mrg lesion is 100% stenosed.  Prox LAD-1 lesion is 90% stenosed.  Prox LAD-2 lesion is 100% stenosed.  LIMA and is normal in caliber.  The graft exhibits no disease.  SVG graft was visualized by angiography and is normal in caliber.  Left radial artery graft was visualized by angiography. 1. Severe native three-vessel coronary artery disease with total occlusion of the RCA, severe left main stenosis, total occlusion of the LAD with severe calcification, and severe stenosis of the circumflex and its branch vessels.  2. Status post aortocoronary  bypass surgery with continued patency of the LIMA to LAD, continued patency of the free radial graft to OM with tight stenosis at the distal anastomosis involving a small caliber vessel, and continued patency of the saphenous vein graft to PDA without any significant disease.  3. Severe aortic valve stenosis with a mean gradient of 40 mmHg. The calculated valve area is 1.74 cm likely related to high cardiac output  4. Moderate pulmonary hypertension with a PA pressure of 72/25 with a mean of 38  Recommendations: The patient will undergo permanent pacemaker placement for symptomatic atrial fibrillation with slow ventricular rate. He will continue to undergo evaluation by the multidisciplinary heart team for consideration of treatment options for his severe aortic stenosis.   Indications  Severe aortic stenosis [I35.0 (ICD-10-CM)]  Procedural Details/Technique  Technical Details INDICATION: Severe aortic stenosis, chronic diastolic CHF  PROCEDURAL DETAILS: The right groin was prepped, draped, and anesthetized with 1% lidocaine. Using the modified Seldinger technique a 5 French sheath was placed in the right femoral artery and a 7 French sheath was placed in the right femoral vein. A Swan-Ganz catheter was used for the right heart catheterization. Standard protocol was followed for recording of right heart pressures and sampling of oxygen saturations. The Swan-Ganz catheter would not advance into the pulmonary capillary wedge position. Fick cardiac output was calculated. Standard Judkins catheters were used for selective coronary angiography. It was difficult to manipulate catheters because of iliac tortuosity. The 11 cm sheath was changed out for a 25 cm sheath. For the native coronaries, a JR4 and JL 5 catheter were used. For the bypass grafts, a JR4 was used for the free radial graft, a LIMA catheter was used for the LIMA graft, and a multipurpose catheter was used for the saphenous vein to PDA graft. An  AL-1 catheter and straight tip wire were used to cross the aortic valve and recorded pullback gradient. There were no immediate procedural complications. The patient was transferred to the post catheterization recovery area for further monitoring.    Estimated blood loss <50 mL.  During this procedure the patient was administered the following to achieve and maintain moderate conscious sedation: Versed 2 mg, Fentanyl 25 mcg, while the patient's heart rate, blood pressure, and oxygen saturation were continuously monitored. The period of  conscious sedation was 46 minutes, of which I was present face-to-face 100% of this time.  Coronary Findings  Diagnostic  Dominance: Right  Left Main  Ost LM to Dist LM lesion 70% stenosed  Ost LM to Dist LM lesion is 70% stenosed. The lesion is severely calcified. The left mainstem is heavily calcified with diffuse 70% stenosis  Left Anterior Descending  Prox LAD-1 lesion 90% stenosed  Prox LAD-1 lesion is 90% stenosed. The lesion is calcified. The proximal LAD has 90% severely calcific stenosis. The first diagonal branch is patent. The LAD occludes prior to the second diagonal origin  Prox LAD-2 lesion 100% stenosed  Prox LAD-2 lesion is 100% stenosed. The lesion is severely calcified.  Left Circumflex  Prox Cx lesion 90% stenosed  Prox Cx lesion is 90% stenosed. The lesion is severely calcified. The circumflex is severely diseased. There is 90% complex calcified stenosis at the bifurcation of the AV circumflex and first obtuse marginal branch. The obtuse marginal branch is diffusely diseased and then divides into twin vessels.  Second Obtuse Marginal Branch  Ost 2nd Mrg lesion 100% stenosed  Ost 2nd Mrg lesion is 100% stenosed. The second OM branch is severely calcified. The vessel is subtotally occluded and fills late, dividing into twin vessels distally.  Right Coronary Artery  Mid RCA lesion 100% stenosed  Mid RCA lesion is 100% stenosed. The lesion is  severely calcified. This is a severely diseased vessel throughout its proximal portion which then occludes in the mid vessel. The distal vessel fills from the saphenous vein graft.  Free LIMA Graft to Mid LAD  LIMA and is normal in caliber. The graft exhibits no disease. The LIMA to LAD is widely patent without significant stenosis.  saphenous Graft to RPDA  SVG graft was visualized by angiography and is normal in caliber. The saphenous vein graft to PDA is widely patent without significant stenosis. This fills the PDA and a large PLA branch and also supplies some collaterals to the left circumflex distribution.  Left Radial Artery Graft to 1st Mrg  Left radial artery graft was visualized by angiography. The free left radial graft to the obtuse marginal branch is small throughout. The graft has a tight stenosis at the distal coronary anastomotic site. The target vessel appears small.  Intervention  No interventions have been documented.  Coronary Diagrams  Diagnostic Diagram     Implants     No implant documentation for this case.  MERGE Images  Link to Procedure Log   Show images for CARDIAC CATHETERIZATION Procedure Log  Hemo Data   Most Recent Value  Fick Cardiac Output 8.58 L/min  Fick Cardiac Output Index 3.55 (L/min)/BSA  Aortic Mean Gradient 40.2 mmHg  Aortic Peak Gradient 37 mmHg  Aortic Valve Area 1.74  Aortic Value Area Index 0.72 cm2/BSA  RA A Wave -99 mmHg  RA V Wave 14 mmHg  RA Mean 12 mmHg  RV Systolic Pressure 69 mmHg  RV Diastolic Pressure 4 mmHg  RV EDP 10 mmHg  PA Systolic Pressure 72 mmHg  PA Diastolic Pressure 25 mmHg  PA Mean 38 mmHg  AO Systolic Pressure 580 mmHg  AO Diastolic Pressure 69 mmHg  AO Mean 998 mmHg  LV Systolic Pressure 338 mmHg  LV Diastolic Pressure 13 mmHg  LV EDP 21 mmHg  Arterial Occlusion Pressure Extended Systolic Pressure 250 mmHg  Arterial Occlusion Pressure Extended Diastolic Pressure 73 mmHg  Arterial Occlusion Pressure Extended  Mean Pressure 114 mmHg  Left Ventricular Apex  Extended Systolic Pressure 852 mmHg  Left Ventricular Apex Extended Diastolic Pressure 12 mmHg  Left Ventricular Apex Extended EDP Pressure 20 mmHg  QP/QS 1  TPVR Index 10.73 HRUI  TSVR Index 29.35 HRUI  TPVR/TSVR Ratio 0.37   ADDENDUM REPORT: 03/31/2017 13:54  CLINICAL DATA: Aortic stenosis  EXAM:  Cardiac TAVR CT  TECHNIQUE:  The patient was scanned on a [employer] scanner. A 120 kV  retrospective scan was triggered in the descending thoracic aorta at  111 HU's. Gantry rotation speed was 270 msecs and collimation was .9  mm. No beta blockade or nitro were given. The 3D data set was  reconstructed in 5% intervals of the R-R cycle. Systolic and  diastolic phases were analyzed on a dedicated work station using  MPR, MIP and VRT modes. The patient received 80 cc of contrast.  FINDINGS:  Aortic Valve: Tri leaflet and severely calcified with restricted  motion  Aorta: Severe calcific aortic atherosclerosis  Sinotubular Junction: 30 mm  Ascending Thoracic Aorta: 34 mm  Aortic Arch: 31 mm  Descending Thoracic Aorta: 26 mm  Sinus of Valsalva Measurements:  Non-coronary: 30.8 mm  Right -coronary: 31.4 mm  Left -coronary: 31.4 mm  Coronary Artery Height above Annulus:  Left Main: 12.8 mm above annulus  Right Coronary: 15 mm above annulus  Virtual Basal Annulus Measurements:  Maximum/Minimum Diameter: 28.2 mm x 21.9 mm  Perimeter: 80 mm  Area: 476 mm2  Coronary Arteries: Sufficient height above annulus for deployment  Patent SVG to RCA Patent free RIMA to OM and patent LIMA to LAD  Optimum Fluoroscopic Angle for Delivery: LAO 16 degrees Caudal 12  degrees  IMPRESSION:  1. Calcified tri leaflet AV with annulus 476 mm2 suitable for a 26  mm Sapien 3 valve  2. Severe calcific aortic atherosclerosis with bovine arch and  normal root diameter 3.4 mm  3. Patent SVG to RCA, patent free RIMA to OM and patent LIMA to LAD  4. Severe LAE  with layering of contrast in LAA cannot r/o thrombus  5. Pacing wires in RA/RV  6. Optimum angiographic angle for deployment LAO 16 degrees Caudal  12 degrees  7. Coronary arteries suitable height above annulus for deployment  Jenkins Rouge  Electronically Signed  By: Jenkins Rouge M.D.  On: 03/31/2017 13:54     Addended by Josue Hector, MD on 03/31/2017 1:56 PM  Study Result   EXAM:  OVER-READ INTERPRETATION CT CHEST  The following report is an over-read performed by radiologist Dr.  Vinnie Langton of [facility], [place] on 03/30/2017. This  over-read does not include interpretation of cardiac or coronary  anatomy or pathology. The coronary calcium score/coronary CTA  interpretation by the cardiologist is attached.  COMPARISON: None.  FINDINGS:  Extracardiac findings will be described separately under dictation  for contemporaneously obtained CTA chest, abdomen and pelvis.  IMPRESSION:  Please see separate dictation for contemporaneously obtained CTA  chest, abdomen and pelvis dated 03/30/2017 for full description of  relevant extracardiac findings.  Electronically Signed:  By: Vinnie Langton M.D.  On: 03/30/2017 14:31   CLINICAL DATA: 77 year old male with history of severe aortic  stenosis. Preprocedural study prior to potential transcatheter  aortic valve replacement (TAVR) procedure.  EXAM:  CT ANGIOGRAPHY CHEST, ABDOMEN AND PELVIS  TECHNIQUE:  Multidetector CT imaging through the chest, abdomen and pelvis was  performed using the standard protocol during bolus administration of  intravenous contrast. Multiplanar reconstructed images and MIPs were  obtained and reviewed to  evaluate the vascular anatomy.  CONTRAST: 80 mL of Isovue 370.  COMPARISON: No priors.  FINDINGS:  CTA CHEST FINDINGS  Cardiovascular: Heart size is mildly enlarged. There is no  significant pericardial fluid, thickening or pericardial  calcification. There is aortic atherosclerosis, as well  as  atherosclerosis of the great vessels of the mediastinum and the  coronary arteries, including calcified atherosclerotic plaque in the  left main, left anterior descending, left circumflex and right  coronary arteries. Status post median sternotomy for CABG including  LIMA to the LAD. Severe dilatation of the pulmonic trunk (4.5 cm),  concerning for pulmonary arterial hypertension.  Mediastinum/Lymph Nodes: No pathologically enlarged mediastinal or  hilar lymph nodes. Esophagus is unremarkable in appearance. No  axillary lymphadenopathy.  Lungs/Pleura: No suspicious appearing pulmonary nodules or masses.  No acute consolidative airspace disease. No pleural effusions. Mild  diffuse bronchial wall thickening with mild centrilobular and  paraseptal emphysema.  Musculoskeletal/Soft Tissues: Median sternotomy wires. There are no  aggressive appearing lytic or blastic lesions noted in the  visualized portions of the skeleton.  CTA ABDOMEN AND PELVIS FINDINGS  Hepatobiliary: The liver has a shrunken appearance and nodular  contour, suggestive of underlying cirrhosis. No definite cystic or  solid hepatic lesions. No intra or extrahepatic biliary ductal  dilatation. Gallbladder is normal in appearance.  Pancreas: No pancreatic mass. No pancreatic ductal dilatation. No  pancreatic or peripancreatic fluid or inflammatory changes.  Spleen: Unremarkable.  Adrenals/Urinary Tract: 1.8 cm low-attenuation lesion in the lower  pole of the right kidney is compatible with a simple cyst. Focal  area of cortical thinning in the interpolar region of the right  kidney, presumably scarring from prior infection or infarct. No  suspicious renal lesions. Left kidney is normal in appearance.  Bilateral adrenal glands are normal in appearance. No  hydroureteronephrosis. Urinary bladder is normal in appearance.  Stomach/Bowel: Normal appearance of the stomach. No pathologic  dilatation of small bowel or colon.  Normal appendix.  Vascular/Lymphatic: Aortic atherosclerosis, with vascular findings  and measurements pertinent to potential TAVR procedure, as detailed  below. Mild stenosis of the proximal celiac axis. In the mid  superior mesenteric artery (axial image 119 of series 8 and coronal  image 75 of series 10) there is a moderate to severe stenosis.  Inferior mesenteric artery appears widely patent without  hemodynamically significant stenosis. Single right renal artery  widely patent. Two left renal arteries, the largest of which  demonstrates mild stenosis at the ostium. No lymphadenopathy noted  in the abdomen or pelvis.  Reproductive: Prostate gland and seminal vesicles are unremarkable  in appearance.  Other: No significant volume of ascites. No pneumoperitoneum.  Musculoskeletal: There are no aggressive appearing lytic or blastic  lesions noted in the visualized portions of the skeleton.  VASCULAR MEASUREMENTS PERTINENT TO TAVR:  AORTA:  Minimal Aortic Diameter-17 x 12 mm  Severity of Aortic Calcification-severe  RIGHT PELVIS:  Right Common Iliac Artery -  Minimal Diameter-9.5 x 8.5 mm  Tortuosity-mild  Calcification-moderate  Right External Iliac Artery -  Minimal Diameter-7.5 x 7.2 mm  Tortuosity-severe  Calcification-mild  Right Common Femoral Artery -  Minimal Diameter-7.9 x 5.5 mm  Tortuosity-mild  Calcification-moderate  LEFT PELVIS:  Left Common Iliac Artery -  Minimal Diameter-9.9 x 7.9 mm  Tortuosity-mild  Calcification-severe  Left External Iliac Artery -  Minimal Diameter-8.5 x 6.7 mm  Tortuosity-moderate to severe  Calcification-mild  Left Common Femoral Artery -  Minimal Diameter-nearly completely occluded and too small to  accurately measure  Tortuosity-mild  Calcification-moderate  Review of the MIP images confirms the above findings.  IMPRESSION:  1. Vascular findings and measurements pertinent to potential TAVR  procedure, as detailed above.  2.  Severe thickening calcification of the aortic valve, compatible  with the reported clinical history of severe aortic stenosis.  3. Aortic atherosclerosis, in addition to left main and 3 vessel  coronary artery disease. Status post median sternotomy for CABG  including LIMA to the LAD.  4. Severe dilatation of the pulmonic trunk (4.5 cm in diameter),  concerning for pulmonary arterial hypertension.  5. Morphologic changes in the liver indicative of mild cirrhosis.  6. 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  7. Additional incidental findings, as above.  Aortic Atherosclerosis (ICD10-I70.0).  Electronically Signed  By: Vinnie Langton M.D.  On: 03/30/2017 15:21    STS Risk Calculator:   Procedure: Isolated AVR  Risk of Mortality: 3.269%  Renal Failure: 4.595%  Permanent Stroke: 2.221%  Prolonged Ventilation: 11.815%  DSW Infection: 0.274%  Reoperation: 3.125%  Morbidity or Mortality: 18.377%  Short Length of Stay: 28.968%  Long Length of Stay: 7.522%   Impression:   This 77 year old gentleman has stage D, severe, symptomatic aortic stenosis with New York Heart Association class II symptoms of exertional fatigue and shortness of breath consistent with chronic diastolic heart failure. His shortness of breath has improved some following permanent pacemaker implantation for persistent atrial fibrillation with a slow ventricular response. I have personally reviewed his echocardiogram, cardiac catheterization, and CTA studies. His echocardiogram shows a trileaflet aortic valve that is severely calcified with restricted leaflet mobility. The mean gradient is 38 mmHg with a dimensionless index of 0.19 consistent with severe aortic stenosis. Cardiac catheterization shows severe diffuse multivessel coronary disease with patent  bypass grafts. The radial artery graft to the obtuse marginal branch is fairly small with a tight stenosis at the distal anastomosis but the obtuse marginal was a small vessel. He has no anginal symptoms and I do not think there is any need for coronary revascularization. I agree that aortic valve replacement is indicated in this patient for symptom relief and to prevent progressive left ventricular deterioration. His operative risk for open surgical aortic valve replacement would be at least moderately elevated due to his age and reduced status as well as diabetes, stage II chronic kidney disease and obesity. I think transcatheter aortic valve replacement would be the best option for him. His gated cardiac CTA shows anatomy suitable for transcatheter aortic valve replacement using a 26 mm Sapien 3 valve. There is severe left atrial enlargement with layering of contrast in the left atrial appendage and we cannot rule out left atrial appendage thrombus. His abdominal and pelvic CTA shows significant aortoiliac and femoral atherosclerosis but I think there is adequate pelvic vasculature to allow right transfemoral insertion. His left common femoral artery has significant calcific plaque and is nearly completely occluded by think we can probably access this vessel for pigtail catheter insertion above this area.   The patient and his wife were counseled at length regarding treatment alternatives for management of severe symptomatic aortic stenosis. The risks and benefits of surgical intervention has been discussed in detail. Long-term prognosis with medical therapy was discussed. Alternative approaches such as conventional surgical aortic valve replacement, transcatheter aortic valve replacement, and palliative  medical therapy were compared and contrasted at length. This discussion was placed in the context of the patient's own specific clinical presentation and past medical history. All of their questions been  addressed.   A discussion was held regarding what types of management strategies would be attempted intraoperatively in the event of life-threatening complications, including whether or not the patient would be considered a candidate for the use of cardiopulmonary bypass and/or conversion to open sternotomy for attempted surgical intervention. The patient has been advised of a variety of complications that might develop including but not limited to risks of death, stroke, paravalvular leak, aortic dissection or other major vascular complications, aortic annulus rupture, device embolization, cardiac rupture or perforation, mitral regurgitation, acute myocardial infarction, arrhythmia, heart block or bradycardia requiring permanent pacemaker placement, congestive heart failure, respiratory failure, renal failure, pneumonia, infection, other late complications related to structural valve deterioration or migration, or other complications that might ultimately cause a temporary or permanent loss of functional independence or other long term morbidity. The patient provides full informed consent for the procedure as described and all questions were answered.    Plan:   Transfemoral TAVR  Gaye Pollack, MD

## 2017-05-09 NOTE — Anesthesia Postprocedure Evaluation (Signed)
Anesthesia Post Note  Patient: Evan [name]  Procedure(s) Performed: TRANSCATHETER AORTIC VALVE REPLACEMENT, TRANSFEMORAL using an Edwards 27m Sapien 3 Aortic Valve (N/A Chest) TRANSESOPHAGEAL ECHOCARDIOGRAM (TEE) (N/A )     Patient location during evaluation: SICU Anesthesia Type: MAC Level of consciousness: awake and alert Pain management: pain level controlled Vital Signs Assessment: post-procedure vital signs reviewed and stable Respiratory status: spontaneous breathing and respiratory function stable Cardiovascular status: stable Postop Assessment: no apparent nausea or vomiting Anesthetic complications: no    Last Vitals:  Vitals:   05/09/17 1157 05/09/17 1300  BP:  139/63  Pulse: 60   Resp:    Temp:  36.9 C  SpO2:      Last Pain:  Vitals:   05/09/17 1300  TempSrc: Axillary  PainSc: 0-No pain                 [name] DANIEL

## 2017-05-09 NOTE — Anesthesia Preprocedure Evaluation (Addendum)
Anesthesia Evaluation  Patient identified by MRN, date of birth, ID band Patient awake    Reviewed: Allergy & Precautions, NPO status , Patient's Chart, lab work & pertinent test results  History of Anesthesia Complications Negative for: history of anesthetic complications  Airway Mallampati: I  TM Distance: >3 FB Neck ROM: Full    Dental  (+) Edentulous Upper, Edentulous Lower, Dental Advisory Given   Pulmonary neg pulmonary ROS, former smoker,    Pulmonary exam normal        Cardiovascular hypertension, + CAD and +CHF  + pacemaker + Valvular Problems/Murmurs AS  Rhythm:Regular Rate:Normal + Systolic murmurs    Neuro/Psych CVA, Residual Symptoms negative psych ROS   GI/Hepatic negative GI ROS, Neg liver ROS,   Endo/Other  diabetes  Renal/GU negative Renal ROS  negative genitourinary   Musculoskeletal negative musculoskeletal ROS (+)   Abdominal   Peds negative pediatric ROS (+)  Hematology negative hematology ROS (+)   Anesthesia Other Findings   Reproductive/Obstetrics negative OB ROS                            Anesthesia Physical Anesthesia Plan  ASA: IV  Anesthesia Plan: MAC   Post-op Pain Management:    Induction: Intravenous  PONV Risk Score and Plan:   Airway Management Planned: Natural Airway and Simple Face Mask  Additional Equipment: Arterial line and CVP  Intra-op Plan:   Post-operative Plan:   Informed Consent: I have reviewed the patients History and Physical, chart, labs and discussed the procedure including the risks, benefits and alternatives for the proposed anesthesia with the patient or authorized representative who has indicated his/her understanding and acceptance.   Dental advisory given  Plan Discussed with: CRNA and Anesthesiologist  Anesthesia Plan Comments:        Anesthesia Quick Evaluation

## 2017-05-09 NOTE — Anesthesia Procedure Notes (Signed)
Procedure Name: MAC Date/Time: 05/09/2017 10:30 AM Performed by: Leonor Liv, CRNA Pre-anesthesia Checklist: Patient identified, Emergency Drugs available, Suction available, Patient being monitored and Timeout performed Oxygen Delivery Method: Simple face mask Placement Confirmation: positive ETCO2

## 2017-05-09 NOTE — Anesthesia Procedure Notes (Signed)
Arterial Line Insertion Start/End4/16/2019 8:55 AM, 05/09/2017 8:57 AM Performed by: Inda Coke, CRNA, CRNA  Patient location: Pre-op. Preanesthetic checklist: patient identified, IV checked, site marked, risks and benefits discussed, surgical consent, monitors and equipment checked, pre-op evaluation, timeout performed and anesthesia consent Lidocaine 1% used for infiltration Right, radial was placed Catheter size: 20 G Hand hygiene performed  and maximum sterile barriers used  Allen's test indicative of satisfactory collateral circulation Attempts: 1 Procedure performed without using ultrasound guided technique. Ultrasound Notes:anatomy identified Following insertion, dressing applied and Biopatch. Post procedure assessment: normal  Patient tolerated the procedure well with no immediate complications.

## 2017-05-10 ENCOUNTER — Encounter (HOSPITAL_COMMUNITY): Payer: Self-pay | Admitting: Cardiovascular Disease

## 2017-05-10 ENCOUNTER — Other Ambulatory Visit: Payer: Self-pay

## 2017-05-10 ENCOUNTER — Inpatient Hospital Stay (HOSPITAL_COMMUNITY): Payer: Medicare Other

## 2017-05-10 DIAGNOSIS — Z952 Presence of prosthetic heart valve: Secondary | ICD-10-CM

## 2017-05-10 DIAGNOSIS — I350 Nonrheumatic aortic (valve) stenosis: Secondary | ICD-10-CM

## 2017-05-10 DIAGNOSIS — Z954 Presence of other heart-valve replacement: Secondary | ICD-10-CM

## 2017-05-10 LAB — BASIC METABOLIC PANEL
Anion gap: 9 (ref 5–15)
BUN: 18 mg/dL (ref 6–20)
CO2: 24 mmol/L (ref 22–32)
Calcium: 8.9 mg/dL (ref 8.9–10.3)
Chloride: 104 mmol/L (ref 101–111)
Creatinine, Ser: 1.14 mg/dL (ref 0.61–1.24)
GFR calc Af Amer: >60 mL/min (ref >60)
GFR calc non Af Amer: >60 mL/min (ref >60)
GLUCOSE: 113 mg/dL — AB (ref 65–99)
POTASSIUM: 4.8 mmol/L (ref 3.5–5.1)
Sodium: 137 mmol/L (ref 135–145)

## 2017-05-10 LAB — GLUCOSE, CAPILLARY
GLUCOSE-CAPILLARY: 141 mg/dL — AB (ref 65–99)
GLUCOSE-CAPILLARY: 151 mg/dL — AB (ref 65–99)
GLUCOSE-CAPILLARY: 99 mg/dL (ref 65–99)
Glucose-Capillary: 253 mg/dL — ABNORMAL HIGH (ref 65–99)
Glucose-Capillary: 93 mg/dL (ref 65–99)

## 2017-05-10 LAB — CBC
HEMATOCRIT: 32.9 % — AB (ref 39.0–52.0)
HEMOGLOBIN: 10.8 g/dL — AB (ref 13.0–17.0)
MCH: 32 pg (ref 26.0–34.0)
MCHC: 32.8 g/dL (ref 30.0–36.0)
MCV: 97.3 fL (ref 78.0–100.0)
Platelets: 188 10*3/uL (ref 150–400)
RBC: 3.38 MIL/uL — AB (ref 4.22–5.81)
RDW: 15.1 % (ref 11.5–15.5)
WBC: 7.2 10*3/uL (ref 4.0–10.5)

## 2017-05-10 LAB — ECHOCARDIOGRAM LIMITED
Height: 72 in
WEIGHTICAEL: 4201.09 [oz_av]

## 2017-05-10 LAB — MAGNESIUM: MAGNESIUM: 2 mg/dL (ref 1.7–2.4)

## 2017-05-10 MED ORDER — BENAZEPRIL HCL 40 MG PO TABS
40.0000 mg | ORAL_TABLET | Freq: Every day | ORAL | Status: DC
  Administered 2017-05-10 – 2017-05-11 (×2): 40 mg via ORAL
  Filled 2017-05-10 (×2): qty 1

## 2017-05-10 MED ORDER — GLIMEPIRIDE 4 MG PO TABS
2.0000 mg | ORAL_TABLET | Freq: Every day | ORAL | Status: DC
  Administered 2017-05-10 – 2017-05-11 (×2): 2 mg via ORAL
  Filled 2017-05-10 (×2): qty 1

## 2017-05-10 MED ORDER — ALBUTEROL SULFATE (2.5 MG/3ML) 0.083% IN NEBU: 2.5000 mg | INHALATION_SOLUTION | Freq: Four times a day (QID) | RESPIRATORY_TRACT | Status: DC | PRN

## 2017-05-10 NOTE — Progress Notes (Signed)
1 Day Post-Op Procedure(s) (LRB): TRANSCATHETER AORTIC VALVE REPLACEMENT, TRANSFEMORAL using an Edwards 15mm Sapien 3 Aortic Valve (N/A) TRANSESOPHAGEAL ECHOCARDIOGRAM (TEE) (N/A) Subjective:  No shortness of breath. Some cough since he URI a couple weeks ago.  Objective: Vital signs in last 24 hours: Temp:  [97.5 F (36.4 C)-98.4 F (36.9 C)] 97.9 F (36.6 C) (04/17 0405) Pulse Rate:  [59-68] 68 (04/17 0700) Cardiac Rhythm: Atrial paced;Normal sinus rhythm (04/17 0400) Resp:  [16-21] 19 (04/17 0700) BP: (131-190)/(45-80) 168/45 (04/17 0700) SpO2:  [93 %-100 %] 96 % (04/17 0700) Arterial Line BP: (112-149)/(50-63) 112/50 (04/16 1800) Weight:  [119.1 kg (262 lb 9.1 oz)-119.3 kg (263 lb)] 119.1 kg (262 lb 9.1 oz) (04/17 0405)  Hemodynamic parameters for last 24 hours:    Intake/Output from previous day: 04/16 0701 - 04/17 0700 In: 2602.1 [I.V.:2302.1; IV Piggyback:300] Out: 1610 [Urine:1560; Blood:50] Intake/Output this shift: No intake/output data recorded.  General appearance: alert and cooperative Neurologic: intact Heart: regularly irregular rhythm Lungs: clear to auscultation bilaterally Extremities: extremities normal, atraumatic, no cyanosis or edema Wound: groin sites ok  Lab Results: Recent Labs    05/09/17 1301 05/10/17 0430  WBC  --  7.2  HGB 10.2* 10.8*  HCT 30.0* 32.9*  PLT  --  188   BMET:  Recent Labs    05/09/17 1154 05/09/17 1301 05/10/17 0430  NA 138 138 137  K 5.1 5.4* 4.8  CL 103  --  104  CO2  --   --  24  GLUCOSE 179* 167* 113*  BUN 20  --  18  CREATININE 0.90  --  1.14  CALCIUM  --   --  8.9    PT/INR: No results for input(s): LABPROT, INR in the last 72 hours. ABG    Component Value Date/Time   PHART 7.337 (L) 05/09/2017 1312   HCO3 24.7 05/09/2017 1312   TCO2 26 05/09/2017 1312   ACIDBASEDEF 1.0 05/09/2017 1312   O2SAT 99.0 05/09/2017 1312   CBG (last 3)  Recent Labs    05/09/17 0757 05/09/17 1658 05/10/17 0036   GLUCAP 127* 115* 151*   CXR: clear  ECG: ventricular pacing this am  Assessment/Plan: S/P Procedure(s) (LRB): TRANSCATHETER AORTIC VALVE REPLACEMENT, TRANSFEMORAL using an Edwards 49mm Sapien 3 Aortic Valve (N/A) TRANSESOPHAGEAL ECHOCARDIOGRAM (TEE) (N/A)  POD 1  He has been hemodynamically stable but hypertensive overnight. Will resume his previous meds this morning. He was not on a beta blocker due to bradycardia and need for PPM. Resume HCTZ at discharge.  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 Will start on albuterol inhaler prn.  2D echo today.  Plan transfer to 4E today and probably home tomorrow.  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 Will resume Eliquis at discharge.   LOS: 1 day    Evan [name] 05/10/2017

## 2017-05-10 NOTE — Progress Notes (Signed)
Received patient from 2 H via w/c. Spouse at bedside. Placed on  Telemetry . No c/o pain.  Per wife patient maybe discharged tomorrow.

## 2017-05-10 NOTE — Anesthesia Procedure Notes (Signed)
Central Venous Catheter Insertion Performed by: Duane Boston, MD, anesthesiologist Start/End4/16/2019 9:37 AM, 05/09/2017 9:47 PM Patient location: Pre-op. Preanesthetic checklist: patient identified, IV checked, site marked, risks and benefits discussed, surgical consent, monitors and equipment checked, pre-op evaluation, timeout performed and anesthesia consent Position: Trendelenburg Lidocaine 1% used for infiltration and patient sedated Hand hygiene performed , maximum sterile barriers used  and Seldinger technique used Catheter size: 8 Fr Total catheter length 16. Central line was placed.Double lumen Procedure performed using ultrasound guided technique. Ultrasound Notes:image(s) printed for medical record Attempts: 1 Following insertion, dressing applied, line sutured and Biopatch. Post procedure assessment: blood return through all ports, free fluid flow and no air  Patient tolerated the procedure well with no immediate complications.

## 2017-05-10 NOTE — Addendum Note (Signed)
Addendum  created 05/10/17 2142 by Duane Boston, MD   Child order released for a procedure order, Intraprocedure Blocks edited, Order Canceled from Note, Sign clinical note

## 2017-05-10 NOTE — Progress Notes (Signed)
  Echocardiogram 2D Echocardiogram has been performed.  Evan [name] 05/10/2017, 12:02 PM

## 2017-05-11 LAB — GLUCOSE, CAPILLARY: Glucose-Capillary: 111 mg/dL — ABNORMAL HIGH (ref 65–99)

## 2017-05-11 MED ORDER — ASPIRIN 81 MG PO TBEC: 81.0000 mg | DELAYED_RELEASE_TABLET | Freq: Every day | ORAL | Status: DC

## 2017-05-11 NOTE — Progress Notes (Signed)
CARDIAC REHAB PHASE I   PRE:  Rate/Rhythm: 67 pacing    BP: sitting 137/59    SaO2: 90 RA  MODE:  Ambulation: 100 ft, then 340 ft   POST:  Rate/Rhythm: 87 pacing    BP: sitting 159/56     SaO2: 93 2L  SATURATION QUALIFICATIONS: (This note is used to comply with regulatory documentation for home oxygen)  Patient Saturations on Room Air at Rest = 90%  Patient Saturations on Room Air while Ambulating = 83%  Patient Saturations on 2 Liters of oxygen while Ambulating = 93%  Please briefly explain why patient needs home oxygen: Pt SAO2 only 90 RA on EOB. Decreased quickly with walking, 83 RA after 50 ft. Returned to room, applied 2L. Up to 96 2L then pt walked again. He was able to maintain in the 90s, no c/o. Fairly steady. He has cane at home for open spaces. Ed completed with pt and wife. He is interested in [place] and will send referral to [place]. Encouraged IS use, able to do 1700 ml. [place], ACSM 05/11/2017 9:50 AM

## 2017-05-11 NOTE — Progress Notes (Addendum)
[address]       [place] [address]             [phone]      2 Days Post-Op Procedure(s) (LRB): TRANSCATHETER AORTIC VALVE REPLACEMENT, TRANSFEMORAL using an Edwards 90mm Sapien 3 Aortic Valve (N/A) TRANSESOPHAGEAL ECHOCARDIOGRAM (TEE) (N/A) Subjective: Feels fine  Objective: Vital signs in last 24 hours: Temp:  [98.1 F (36.7 C)-98.6 F (37 C)] 98.6 F (37 C) (04/18 0516) Pulse Rate:  [58-63] 58 (04/18 0516) Cardiac Rhythm: Ventricular paced (04/18 0700) Resp:  [15-20] 18 (04/18 0516) BP: (136-154)/(50-57) 136/51 (04/18 0516) SpO2:  [94 %-98 %] 97 % (04/18 0516)  Hemodynamic parameters for last 24 hours:    Intake/Output from previous day: 04/17 0701 - 04/18 0700 In: 240 [P.O.:240] Out: 100 [Urine:100] Intake/Output this shift: No intake/output data recorded.  General appearance: alert, cooperative and no distress Heart: regular rate and rhythm and soft systolic murmur Lungs: clear to auscultation bilaterally Abdomen: benign Extremities: no edema Wound: incis healing well  Lab Results: Recent Labs    05/09/17 1301 05/10/17 0430  WBC  --  7.2  HGB 10.2* 10.8*  HCT 30.0* 32.9*  PLT  --  188   BMET:  Recent Labs    05/09/17 1154 05/09/17 1301 05/10/17 0430  NA 138 138 137  K 5.1 5.4* 4.8  CL 103  --  104  CO2  --   --  24  GLUCOSE 179* 167* 113*  BUN 20  --  18  CREATININE 0.90  --  1.14  CALCIUM  --   --  8.9    PT/INR: No results for input(s): LABPROT, INR in the last 72 hours. ABG    Component Value Date/Time   PHART 7.337 (L) 05/09/2017 1312   HCO3 24.7 05/09/2017 1312   TCO2 26 05/09/2017 1312   ACIDBASEDEF 1.0 05/09/2017 1312   O2SAT 99.0 05/09/2017 1312   CBG (last 3)  Recent Labs    05/10/17 1716 05/10/17 2116 05/11/17 0603  GLUCAP 93 99 111*    Meds Scheduled Meds: . amLODipine  2.5 mg Oral Daily  . aspirin EC  81 mg Oral Daily  . benazepril  40 mg Oral Daily  . ezetimibe  10 mg Oral QPM    . fluticasone  2 spray Each Nare Daily  . glimepiride  2 mg Oral Q breakfast  . insulin aspart  0-15 Units Subcutaneous TID WC  . rosuvastatin  20 mg Oral QPM  . tamsulosin  0.4 mg Oral QPC supper   Continuous Infusions: PRN Meds:.albuterol, guaiFENesin-codeine, ondansetron (ZOFRAN) IV, oxyCODONE, polyvinyl alcohol, traMADol  Xrays Dg Chest Port 1 View  Result Date: 05/10/2017 CLINICAL DATA:  TAVR yesterday. EXAM: PORTABLE CHEST 1 VIEW COMPARISON:  One-view chest x-ray 05/09/2017 FINDINGS: Right IJ line is stable. Pacing wires stable. The heart is enlarged. Previous CABG is noted. Aortic valve replacement is noted. There is no significant edema or effusion. IMPRESSION: 1. Status post TAVR. 2. No acute abnormality. Electronically Signed   By: San Morelle M.D.   On: 05/10/2017 08:48   Dg Chest Port 1 View  Result Date: 05/09/2017 CLINICAL DATA:  Status post TAVR EXAM: PORTABLE CHEST 1 VIEW COMPARISON:  05/05/2017 FINDINGS: Right jugular central line is noted as well as postoperative changes. TAVR stent is noted in satisfactory position. Cardiac shadow remains enlarged. The lungs are clear bilaterally. Pacing device is again noted. No bony abnormality is seen. IMPRESSION: Status post  TAVR.  No acute abnormality is noted. Electronically Signed   By: Inez Catalina M.D.   On: 05/09/2017 15:08    Assessment/Plan: S/P Procedure(s) (LRB): TRANSCATHETER AORTIC VALVE REPLACEMENT, TRANSFEMORAL using an Edwards 38mm Sapien 3 Aortic Valve (N/A) TRANSESOPHAGEAL ECHOCARDIOGRAM (TEE) (N/A)  1 doing well 2 V paced, hemodyn stable 3 no new labs 4 echo looks good 5 stable for discharge  LOS: 2 days    Evan [name] 05/11/2017    Chart reviewed, patient examined, agree with above. Echo shows low aortic valve gradient and no paravalvular leak.  Sats decreased to 83% with ambulation on RA but 90% at rest. Arranging home oxygen. He is going home today when oxygen arrives.

## 2017-05-11 NOTE — Discharge Instructions (Signed)
ACTIVITY AND EXERCISE °• Daily activity and exercise are an important part °of your recovery. People recover at different rates °depending on their general health and type of °valve procedure. °• Most people require six to 10 weeks to feel °recovered. °• No lifting, pushing, pulling more than 10 pounds °(examples to avoid: groceries, vacuuming, °gardening, golfing): °- For one week with a procedure through the groin. °- For six weeks for procedures through the chest °wall. °- For three months for procedures through the °breast-bone. °• After the initial healing process of the access site, °we recommend cardiac rehabilitation for all TAVR °patients. Cardiac rehabilitation will help you: °- Rebuild stamina, strength and balance. °- Learn how to participate in activities safely, as well °as help you regain confidence to do so. °- Return to activities of daily living and leisure. °• Discuss attending cardiac rehabilitation at your °follow-up appointment  ° °DRIVING °• Do not drive for four weeks after the date of your °procedure. °• If you have been told by your doctor in the past °that you may not drive, you must talk with him/her °before you begin driving again. °• When you resume driving, you must have someone °with you. ° °HYGIENE °If you had a femoral (leg) procedure, you may take a shower when you return home. After the shower, pat the °site dry. Do NOT use powder, oils or lotions in your groin area until the site has completely healed. °• If you had a chest procedure, you may shower when you return home unless specifically instructed not to by °your discharging practitioner. °- DO NOT scrub incision; pat dry with a towel °- DO NOT apply any lotions, oils, powders to the incision °- No tub baths / swimming for at least six weeks. ° °SITE CARE °• You likely will have small openings in both groins °from catheters used during the procedure. If you °had a transfemoral procedure, one groin will have a °larger opening  and may be bruised or tender. °• If you had a chest procedure, you will have either a °small incision in your upper sternum (breast-bone) °or between your ribs on your left side. °- Chest wall site: The surgical incision should be °kept dry (no lotions / oils / powders) and open °to air. If you experience irritation from clothing °rubbing on the incision, a light gauze dressing °may be applied. °- Inspect your incision daily; notify your °physician if there is increased redness, swelling °or drainage from the incision. °- If the incision is located on your breast-bone °you must avoid lifting objects heavier than a °gallon of milk (eight pounds) and stretching / °twisting / pulling with your arms for at least °three months to ensure strong bone healing. ° ° °CONTACT [phone] °• Check your sites daily. Contact our office if you have °any of the following problems: °- Redness and warmth that does not go away °- Yellow or green drainage from the wound °- Fever and chills °- Increasing numbness in your legs °- Worsening pain at the site °• If you had a leg/groin procedure, it is normal to °have bruising or a soft lump at the site. It is not °normal if the lump suddenly becomes larger or °more firm. This may mean you are bleeding. If this °happens: °- Lie down °- Have someone press down hard, just above °the hole in your skin where the procedure was °performed for 15 minutes. If after holding on the °site, the lump does not become larger or harder, °they   are performing this correctly. °- If the bleeding has stopped after 15 minutes, rest °and stay laying down for at least two hours. °- If the bleeding continues, call 911 for an °ambulance. Do NOT drive yourself or have °someone else drive you. °

## 2017-05-11 NOTE — Discharge Summary (Addendum)
Physician Discharge Summary  Patient ID: Evan [name] MRN: [id number] DOB/AGE: [birth year] 77 y.o.  Admit date: 05/09/2017 Discharge date: 05/11/2017  Admission Diagnoses: Severe aortic stenosis  Discharge Diagnoses:  Active Problems:   Severe aortic stenosis   S/P TAVR (transcatheter aortic valve replacement)   Patient Active Problem List   Diagnosis Date Noted  . S/P TAVR (transcatheter aortic valve replacement)   . Bronchitis 04/17/2017  . Severe aortic stenosis 03/23/2017  . Atrial fibrillation, permanent ([place]) 03/23/2017  . Peripheral vision loss, bilateral   . Osteoarthritis   . Hypertension   . HOH (hard of hearing)   . Heart murmur   . Frequency of urination   . Dyspnea   . Bruises easily   . Essential hypertension 10/15/2015  . 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 Diarrhea 08/24/2015  . Stroke ([place]) 07/25/2015  . Edema of leg 03/20/2015  . Bradycardia 03/20/2015  . Nocturia 10/24/2014  . Atrial fibrillation ([place]) 02/20/2013  . Aftercare following surgery of the circulatory system, [place] 06/07/2012  . Occlusion and stenosis of carotid artery without mention of cerebral infarction 04/14/2011  . Shoulder pain 04/14/2011  . 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 Diabetes mellitus ([place]) 02/01/2011  . Coronary artery disease   . Hyperlipidemia   . Hypertensive urgency   . Obesity   . Carotid arterial disease ([place])   . Torn rotator cuff 01/24/2010   HPI:at time of consultation This 77 year old gentleman has stage D, severe, symptomatic aortic stenosis with New York Heart Association class II symptoms of exertional fatigue and shortness of breath consistent with chronic diastolic heart failure. His shortness of breath has improved some following permanent pacemaker implantation for persistent atrial fibrillation with a slow ventricular response. I have personally reviewed  his echocardiogram, cardiac catheterization, and CTA studies. His echocardiogram shows a trileaflet aortic valve that is severely calcified with restricted leaflet mobility. The mean gradient is 38 mmHg with a dimensionless index of 0.19 consistent with severe aortic stenosis. Cardiac catheterization shows severe diffuse multivessel coronary disease with patent bypass grafts. The radial artery graft to the obtuse marginal branch is fairly small with a tight stenosis at the distal anastomosis but the obtuse marginal was a small vessel. He has no anginal symptoms and I do not think there is any need for coronary revascularization. I agree that aortic valve replacement is indicated in this patient for symptom relief and to prevent progressive left ventricular deterioration. His operative risk for open surgical aortic valve replacement would be at least moderately elevated due to his age and reduced status as well as diabetes, stage II chronic kidney disease and obesity. I think transcatheter aortic valve replacement would be the best option for him. His gated cardiac CTA shows anatomy suitable for transcatheter aortic valve replacement using a 26 mm Sapien 3 valve. There is severe left atrial enlargement with layering of contrast in the left atrial appendage and we cannot rule out left atrial appendage thrombus. His abdominal and pelvic CTA shows significant aortoiliac and femoral atherosclerosis but I think there is adequate pelvic vasculature to allow right transfemoral insertion. His left common femoral artery has significant calcific plaque and is nearly completely occluded by think we can probably access this vessel for pigtail catheter insertion above this area.   The patient and his wife were counseled at length regarding treatment alternatives for management of severe symptomatic aortic stenosis. The risks and  benefits of surgical intervention has been discussed in detail. Long-term prognosis with medical  therapy was discussed. Alternative approaches such as conventional surgical aortic valve replacement, transcatheter aortic valve replacement, and palliative medical therapy were compared and contrasted at length. This discussion was placed in the context of the patient's own specific clinical presentation and past medical history. All of their questions been addressed.   A discussion was held regarding what types of management strategies would be attempted intraoperatively in the event of life-threatening complications, including whether or not the patient would be considered a candidate for the use of cardiopulmonary bypass and/or conversion to open sternotomy for attempted surgical intervention. The patient has been advised of a variety of complications that might develop including but not limited to risks of death, stroke, paravalvular leak, aortic dissection or other major vascular complications, aortic annulus rupture, device embolization, cardiac rupture or perforation, mitral regurgitation, acute myocardial infarction, arrhythmia, heart block or bradycardia requiring permanent pacemaker placement, congestive heart failure, respiratory failure, renal failure, pneumonia, infection, other late complications related to structural valve deterioration or migration, or other complications that might ultimately cause a temporary or permanent loss of functional independence or other long term morbidity. The patient provides full informed consent for the procedure as described and all questions were answered.   The patient was admitted electively for the procedure.  Discharged Condition: good  Hospital Course: The patient was admitted electively and on 05/09/2017 taken to the operating room where he underwent the below described procedure.  He tolerated it well and was taken to the surgical intensive care unit in stable condition.  Postoperative hospital course:  The patient is done quite well.   Postoperative echocardiogram has been reviewed and the valve is found to be in excellent position.  He has remained hemodynamically stable and is being chronically V paced.  He is tolerating gradually increasing activities using standard protocols.  His groin sites showed no evidence of hematoma.  He has been weaning from oxygen but does have desats on room air so we will arrange short term home O2.   Preoperative medications have been resumed.  Blood sugars have been under adequate control.  Hemoglobin and hematocrit are 10.8 and 32.9 respectively.  He did have a preoperative anemia.  These values are stable.  Renal function is also stable with most recent BUN 18 and creatinine 1.14.  At time of discharge the patient is felt to be quite stable.  Consults: cardiology  Significant Diagnostic Studies: Routine postoperative echocardiogram  Treatments: surgery:   TAVR OPERATIVE NOTE   Date of Procedure:                05/09/2017  Preoperative Diagnosis:      Severe Aortic Stenosis   Postoperative Diagnosis:    Same   Procedure:        Transcatheter Aortic Valve Replacement - Percutaneous Right Transfemoral Approach             Edwards Sapien 3 THV (size 26 mm, model # 9600TFX, serial # [id number])              Co-Surgeons:                        Gaye Pollack, MD and Sherren Mocha, MD    Anesthesiologist:                  Tamela Gammon, MD  Echocardiographer:  Ena Dawley, MD  Pre-operative Echo Findings: ? Severe aortic stenosis ? Mild left ventricular systolic  dysfunction  Post-operative Echo Findings: ? Trace paravalvular leak ? Mild left ventricular systolic dysfunction   Discharge Exam: Blood pressure (!) 136/51, pulse (!) 58, temperature 98.6 F (37 C), temperature source Oral, resp. rate 18, height 6' (1.829 m), weight 119.1 kg (262 lb 9.1 oz), SpO2 97 %.   General appearance: alert, cooperative and no distress Heart: regular rate and rhythm  and soft systolic murmur Lungs: clear to auscultation bilaterally Abdomen: benign Extremities: no edema Wound: incis healing well    Disposition: Discharge disposition: 01-Home or Self Care       Discharge Instructions    Discharge patient   Complete by:  As directed    Discharge disposition:  01-Home or Self Care   Discharge patient date:  05/11/2017     Allergies as of 05/11/2017      Reactions   Niacin And Related Rash, Other (See Comments)   Red face and burns.   Metformin And Related Diarrhea   Statins Other (See Comments)   Myalgias, weakness      Medication List    STOP taking these medications   doxycycline 100 MG capsule Commonly known as:  VIBRAMYCIN     TAKE these medications   acetaminophen 650 MG CR tablet Commonly known as:  TYLENOL Take 1,300 mg by mouth every 8 (eight) hours as needed for pain.   amLODipine 2.5 MG tablet Commonly known as:  NORVASC Take 2.5 mg by mouth daily.   apixaban 5 MG Tabs tablet Commonly known as:  ELIQUIS Take 1 tablet (5 mg total) by mouth 2 (two) times daily.   aspirin 81 MG EC tablet Take 1 tablet (81 mg total) by mouth daily.   benazepril 40 MG tablet Commonly known as:  LOTENSIN Take 1 tablet (40 mg total) by mouth daily.   ezetimibe 10 MG tablet Commonly known as:  ZETIA Take 1 tablet (10 mg total) by mouth daily. What changed:  when to take this   fluticasone 50 MCG/ACT nasal spray Commonly known as:  FLONASE Place 2 sprays into both nostrils daily.   glimepiride 2 MG tablet Commonly known as:  AMARYL Take 2 mg by mouth daily with breakfast.   guaiFENesin-codeine 100-10 MG/5ML syrup Take by mouth.   hydrochlorothiazide 25 MG tablet Commonly known as:  HYDRODIURIL Take 1 tablet (25 mg total) by mouth every morning.   MUSCLE RUB ULTRA STRENGTH 05-03-28 % [place]a Generic drug:  Camphor-Menthol-Methyl Sal Apply 1 application topically 3 (three) times daily as needed (muscle pain).   OVER THE  COUNTER MEDICATION Replenex tablets: Take 3 tablet by mouth every morning (joint health)   RA VITAMIN B-12 TR 1000 MCG Tbcr Generic drug:  Cyanocobalamin Take 1,000 mcg by mouth daily.   REFRESH OP Place 2 drops into both eyes daily as needed (for dry eyes).   rosuvastatin 20 MG tablet Commonly known as:  CRESTOR Take 1 tablet (20 mg total) by mouth daily. What changed:  when to take this   tamsulosin 0.4 MG Caps capsule Commonly known as:  FLOMAX TAKE 1 CAPSULE BY MOUTH ONCE DAILY What changed:    how much to take  how to take this  when to take this        Signed: John Giovanni 05/11/2017, 8:59 AM

## 2017-05-11 NOTE — Plan of Care (Signed)
  Problem: Education: Goal: Knowledge of General Education information will improve Outcome: Progressing   Problem: Clinical Measurements: Goal: Ability to maintain clinical measurements within normal limits will improve Outcome: Progressing Goal: Diagnostic test results will improve Outcome: Progressing

## 2017-05-11 NOTE — Care Management Note (Signed)
Case Management Note Marvetta Gibbons RN, BSN Unit 4E-Case Manager [phone]  Patient Details  Name: Evan [name] MRN: [id number] Date of Birth: July 24, 1940  Subjective/Objective:   Pt admitted s/p TAVR                 Action/Plan: PTA pt lived at home with wife- per cardiac rehab pt will need home 02- have spoken with [place]- order has been placed- notified Butch Penny with [facility] for home 02 needs- once approved - portable tank to be delivered to room for discharge- Pt to return home with wife- no further CM needs noted for transition home. Bedside RN- [place] aware of pending home 02 approval.   Expected Discharge Date:  05/11/17               Expected Discharge Plan:  Home/Self Care  In-House Referral:  NA  Discharge planning Services  CM Consult  Post Acute Care Choice:  Durable Medical Equipment Choice offered to:  Patient  DME Arranged:  Oxygen DME Agency:  [place] Arranged:  NA [place] Agency:  NA  Status of Service:  Completed, signed off  If discussed at [place] of Stay Meetings, dates discussed:    Discharge Disposition: home/self care   Additional Comments:  Dawayne Patricia, RN 05/11/2017, 11:15 AM

## 2017-05-12 ENCOUNTER — Telehealth: Payer: Self-pay

## 2017-05-12 MED FILL — Magnesium Sulfate Inj 50%: INTRAMUSCULAR | Qty: 10 | Status: AC

## 2017-05-12 MED FILL — Heparin Sodium (Porcine) Inj 1000 Unit/ML: INTRAMUSCULAR | Qty: 30 | Status: AC

## 2017-05-12 MED FILL — Potassium Chloride Inj 2 mEq/ML: INTRAVENOUS | Qty: 40 | Status: AC

## 2017-05-12 NOTE — Telephone Encounter (Signed)
Patient contacted regarding discharge from [facility] on 05/11/2017.  Patient understands to follow up with provider Almyra Deforest PA on 05/19/2017 at 10:00 AM at [facility] office. Patient understands discharge instructions? yes Patient understands medications and regiment? yes Patient understands to bring all medications to this visit? yes  I spoke with the pt's wife and she said the pt has a history of sleep walking and last night he had a spell and this scared her.  When the pt has had previous episodes he did not have knowledge of where he was located and this usually does not last long. Last night he had a similar episode but his unawareness and disorientation lasted longer than normal.  She attempted to call 911 but the pt refused.  The pt soon returned to baseline and was able to rest at home. Today the pt is doing okay per wife.  She will continue to monitor him for any episodes of confusion and contact the office if this continues or call 911.  She also restarted the pt's Eliquis last night and wanted to know when the pt should take ASA 81mg .  I advised her that the pt can take his ASA with his morning medications.   All questions answered at this time and the pt's wife is aware of additional appointments arranged on 06/14/17 for TAVR follow-up.  I did make her aware that I am out of the office until 4/29.  She will contact [facility] office with any additional questions or concerns.

## 2017-05-16 ENCOUNTER — Telehealth: Payer: Self-pay

## 2017-05-16 NOTE — Telephone Encounter (Signed)
Per DPR form, left detailed message stating Cardiac Rehab may not have called yet because the patient has not been evaluated post-procedure. Instructed them to give it a few more days after OV 4/26 and to call if they do not hear anything.

## 2017-05-18 ENCOUNTER — Telehealth: Payer: Self-pay | Admitting: Cardiology

## 2017-05-18 NOTE — Telephone Encounter (Signed)
Patient calling the office for samples of medication:   1.  What medication and dosage are you requesting samples for? Equilis 5mg   2.  Are you currently out of this medication? No

## 2017-05-18 NOTE — Telephone Encounter (Signed)
Returned call to Pt's wife and informed the medication is available for pick up at the front desk. Eliquis 5 mg Qty: 2 boxes Lot # [id number] Exp: 3/21

## 2017-05-19 ENCOUNTER — Ambulatory Visit (INDEPENDENT_AMBULATORY_CARE_PROVIDER_SITE_OTHER): Payer: Medicare Other | Admitting: Physician Assistant

## 2017-05-19 ENCOUNTER — Encounter: Payer: Self-pay | Admitting: Physician Assistant

## 2017-05-19 VITALS — BP 162/78 | HR 61 | Ht 72.0 in | Wt 263.3 lb

## 2017-05-19 DIAGNOSIS — I25810 Atherosclerosis of coronary artery bypass graft(s) without angina pectoris: Secondary | ICD-10-CM | POA: Diagnosis not present

## 2017-05-19 DIAGNOSIS — I779 Disorder of arteries and arterioles, unspecified: Secondary | ICD-10-CM | POA: Diagnosis not present

## 2017-05-19 DIAGNOSIS — Z950 Presence of cardiac pacemaker: Secondary | ICD-10-CM

## 2017-05-19 DIAGNOSIS — I482 Chronic atrial fibrillation: Secondary | ICD-10-CM

## 2017-05-19 DIAGNOSIS — I10 Essential (primary) hypertension: Secondary | ICD-10-CM

## 2017-05-19 DIAGNOSIS — I6523 Occlusion and stenosis of bilateral carotid arteries: Secondary | ICD-10-CM

## 2017-05-19 DIAGNOSIS — E785 Hyperlipidemia, unspecified: Secondary | ICD-10-CM

## 2017-05-19 DIAGNOSIS — I739 Peripheral vascular disease, unspecified: Secondary | ICD-10-CM

## 2017-05-19 DIAGNOSIS — I4821 Permanent atrial fibrillation: Secondary | ICD-10-CM

## 2017-05-19 DIAGNOSIS — Z952 Presence of prosthetic heart valve: Secondary | ICD-10-CM

## 2017-05-19 MED ORDER — AMLODIPINE BESYLATE 5 MG PO TABS: 5.0000 mg | ORAL_TABLET | Freq: Every day | ORAL | 3 refills | Status: DC

## 2017-05-19 NOTE — Patient Instructions (Addendum)
Almyra Deforest, [place] has recommended making the following medication changes: 1. INCREASE Amlodipine to 5 mg daily  Please keep your upcoming appointments in May for your echocardiogram, with Nell Range PA, and with Dr Curt Bears.  Isaac Laud recommends that you schedule a follow-up appointment in August with Dr [name].  If you need a refill on your cardiac medications before your next appointment, please call your pharmacy.

## 2017-05-19 NOTE — Progress Notes (Signed)
Cardiology Office Note    Date:  05/21/2017   ID:  Evan [name], DOB 1940/11/19, MRN [id number]  PCP:  Leonel Ramsay, MD  Cardiologist: Dr. [name] Electrophysiologist Dr. Curt Bears PV specialist: Dr. Burt Knack  Chief Complaint  Patient presents with  . Follow-up    sent o2 home from hospital and they do not feel its needed, pt denies chest pains; pt does have swelling in feet, and SOB when walking    History of Present Illness:  Evan [name] is a 77 y.o. male with past medical history of CAD s/p CABG 2005, hypertension, hyperlipidemia, carotid artery disease, permanent atrial fibrillation, severe aortic stenosis s/p TAVR, DM 2, and pacemaker.  Cardiac catheterization in 2007 demonstrated patent grafts.  Myoview in 2013 showed no significant abnormality.  He had left carotid enterectomy in May 2014.  He is also intolerant to statins due to severe myalgia and weakness.  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  Sleep study in October 2017 only showed borderline sleep apnea with minimal desaturation.  Patient was last seen by Dr. [name] on 02/07/2017, he was doing well at the time.  However follow-up echocardiogram obtained on 03/01/2017 showed the patient's aortic stenosis has significantly progressed since 2017.  Dr. Curt Bears recommended permanent pacemaker placement due to severe conduction disease, he eventually had St Jude MRI compatible dual-chamber pacemaker implanted on 03/23/2017.  He was evaluated by Dr. Burt Knack for TAVR and eventually underwent the procedure with Dr. Cyndia Bent and Dr. Burt Knack with placement of Edwards Sapien 3 THV size 26 mm bioprosthetic valve.  Post-procedure, patient recovered quickly.  Patient presents today for cardiology office evaluation.  He still has some baseline dyspnea on exertion, however quickly  improving.  He does not have any lower extremity edema, orthopnea or PND.  On reassessment, my manual blood pressure reading was 154/56.  I will increase his amlodipine to 5 mg daily.  He was discharged home recently on home oxygen, apparently his O2 saturation dropped down to 83% with ambulation on room air.  He has not been using his home oxygen.  I ambulated the patient in the office today, oxygen saturation was 96% at rest with a heart rate of 60, after walking about 40 to 50 yards, his O2 saturation was hovering between 91 to 94% with heart rate of 88.  He no longer needs home oxygen.  He has upcoming visit with Nell Range, PA-C and repeat echocardiogram on the same day.  He will also follow-up with Dr. Curt Bears in May for his [facility] pacemaker.  He can follow-up with Dr. [name] in August.   Past Medical History:  Diagnosis Date  . Anemia   . Carotid arterial disease ([place])   . CHF (congestive heart failure) ([place])   . Coronary artery disease   . HOH (hard of hearing)   . Hyperlipidemia   . Hypertension   . Nocturia   . Obesity   . Osteoarthritis    "BUE; BLE; right knee" (03/23/2017)  . Peripheral vision loss, bilateral    S/P 07/2015  . Permanent atrial fibrillation ([place])   . 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 Severe aortic stenosis   . Sinus drainage   . Stroke [facility]) 07/2015   "peripheral vision still bad out of left eye" (03/23/2017)  . Torn rotator cuff 2012  Right arm  . Type II diabetes mellitus ([place])   . Urinary frequency     Past Surgical History:  Procedure Laterality Date  . BACK SURGERY    . CARDIAC CATHETERIZATION  11/29/2005   SEVERE THREE VESSEL OBSTRUCTIVE ATHERSCLEROTIC CAD. ALL GRAFTS WERE PATENT. EF 65%  . CARDIAC CATHETERIZATION  03/23/2017  . CAROTID ENDARTERECTOMY Right 01-29-08   cea  . CARPAL TUNNEL RELEASE Right   . CORONARY ARTERY BYPASS GRAFT  03/2003   X3. LIMA GRAFT TO THE LAD, SAPHENOUS VEIN GRAFT TO THE PA, AND A LEFT RADIAL  GRAFT TO THEOBTUSE MARGINAL VESSEL  . ENDARTERECTOMY Left 05/28/2012   Procedure: ENDARTERECTOMY CAROTID;  Surgeon: Elam Dutch, MD;  Location: [place];  Service: Vascular;  Laterality: Left;  . INSERT / REPLACE / REMOVE PACEMAKER  03/23/2017  . [place]; 1984  . MOLE SURGERY     "? face/arms"  . PACEMAKER IMPLANT N/A 03/23/2017   Procedure: PACEMAKER IMPLANT;  Surgeon: Constance Haw, MD;  Location: [place] CV LAB;  Service: Cardiovascular;  Laterality: N/A;  . PATCH ANGIOPLASTY Left 05/28/2012   Procedure: WITH dACRON PATCH ANGIOPLASTY;  Surgeon: Elam Dutch, MD;  Location: [place];  Service: Vascular;  Laterality: Left;  . RIGHT/LEFT HEART CATH AND CORONARY/GRAFT ANGIOGRAPHY N/A 03/23/2017   Procedure: RIGHT/LEFT HEART CATH AND CORONARY/GRAFT ANGIOGRAPHY;  Surgeon: Sherren Mocha, MD;  Location: [place] [facility];  Service: Cardiovascular;  Laterality: N/A;  . TEE WITHOUT CARDIOVERSION N/A 05/09/2017   Procedure: TRANSESOPHAGEAL ECHOCARDIOGRAM (TEE);  Surgeon: Sherren Mocha, MD;  Location: [place];  Service: Open Heart Surgery;  Laterality: N/A;  . TRANSCATHETER AORTIC VALVE REPLACEMENT, TRANSFEMORAL N/A 05/09/2017   Procedure: TRANSCATHETER AORTIC VALVE REPLACEMENT, TRANSFEMORAL using an Edwards 49mm Sapien 3 Aortic Valve;  Surgeon: Sherren Mocha, MD;  Location: [place];  Service: Open Heart Surgery;  Laterality: N/A;    Current Medications: Outpatient Medications Prior to Visit  Medication Sig Dispense Refill  . acetaminophen (TYLENOL) 650 MG CR tablet Take 1,300 mg by mouth every 8 (eight) hours as needed for pain.    Marland Kitchen apixaban (ELIQUIS) 5 MG TABS tablet Take 1 tablet (5 mg total) by mouth 2 (two) times daily. 28 tablet 0  . aspirin EC 81 MG EC tablet Take 1 tablet (81 mg total) by mouth daily.    . benazepril (LOTENSIN) 40 MG tablet Take 1 tablet (40 mg total) by mouth daily. 90 tablet 3  . Camphor-Menthol-Methyl Sal (MUSCLE RUB ULTRA STRENGTH) 05-03-28 %  CREA Apply 1 application topically 3 (three) times daily as needed (muscle pain).    . Cyanocobalamin (RA VITAMIN B-12 TR) 1000 MCG TBCR Take 1,000 mcg by mouth daily.     Marland Kitchen ezetimibe (ZETIA) 10 MG tablet Take 1 tablet (10 mg total) by mouth daily. (Patient taking differently: Take 10 mg by mouth every evening. ) 30 tablet 11  . fluticasone (FLONASE) 50 MCG/ACT nasal spray Place 2 sprays into both nostrils daily.    Marland Kitchen glimepiride (AMARYL) 2 MG tablet Take 2 mg by mouth daily with breakfast.    . guaiFENesin-codeine 100-10 MG/5ML syrup Take by mouth.    . hydrochlorothiazide (HYDRODIURIL) 25 MG tablet Take 1 tablet (25 mg total) by mouth every morning. 90 tablet 1  . OVER THE COUNTER MEDICATION Replenex tablets: Take 3 tablet by mouth every morning (joint health)    . Polyvinyl Alcohol-Povidone (REFRESH OP) Place 2 drops into both eyes daily as needed (for dry  eyes).     . rosuvastatin (CRESTOR) 20 MG tablet Take 1 tablet (20 mg total) by mouth daily. (Patient taking differently: Take 20 mg by mouth every evening. ) 30 tablet 0  . tamsulosin (FLOMAX) 0.4 MG CAPS capsule TAKE 1 CAPSULE BY MOUTH ONCE DAILY (Patient taking differently: TAKE 0.4 MG BY MOUTH ONCE DAILY IN THE EVENING) 90 capsule 3  . amLODipine (NORVASC) 2.5 MG tablet Take 2.5 mg by mouth daily.     No facility-administered medications prior to visit.      Allergies:   Niacin and related; Metformin and related; and Statins   Social History   Socioeconomic History  . Marital status: Married    Spouse name: Not on file  . Number of children: 5  . Years of education: Not on file  . Highest education level: Not on file  Occupational History  . Occupation: retired    Fish farm manager: RETIRED  Social Needs  . Financial resource strain: Not on file  . Food insecurity:    Worry: Not on file    Inability: Not on file  . Transportation needs:    Medical: Not on file    Non-medical: Not on file  Tobacco Use  . Smoking status: Former  Smoker    Packs/day: 2.00    Years: 39.00    Pack years: 78.00    Types: Cigarettes    Last attempt to quit: 01/24/1994    Years since quitting: 23.3  . Smokeless tobacco: Never Used  Substance and Sexual Activity  . Alcohol use: No  . Drug use: No  . Sexual activity: Never  Lifestyle  . Physical activity:    Days per week: Not on file    Minutes per session: Not on file  . Stress: Not on file  Relationships  . Social connections:    Talks on phone: Not on file    Gets together: Not on file    Attends religious service: Not on file    Active member of club or organization: Not on file    Attends meetings of clubs or organizations: Not on file    Relationship status: Not on file  Other Topics Concern  . Not on file  Social History Narrative  . Not on file     Family History:  The patient's family history includes Arrhythmia in his brother; Cancer in his brother; Heart attack in his brother and father; Parkinsonism in his brother.   ROS:   Please see the history of present illness.    ROS All other systems reviewed and are negative.   PHYSICAL EXAM:   VS:  BP (!) 162/78 (BP Location: Left Arm)   Pulse 61   Ht 6' (1.829 m)   Wt 263 lb 4.8 oz (119.4 kg)   BMI 35.71 kg/m    GEN: Well nourished, well developed, in no acute distress  HEENT: normal  Neck: no JVD, carotid bruits, or masses Cardiac: RRR; no murmurs, rubs, or gallops,no edema  +pacemaker present Respiratory:  clear to auscultation bilaterally, normal work of breathing GI: soft, nontender, nondistended, + BS MS: no deformity or atrophy  Skin: warm and dry, no rash Neuro:  Alert and Oriented x 3, Strength and sensation are intact Psych: euthymic mood, full affect  Wt Readings from Last 3 Encounters:  05/19/17 263 lb 4.8 oz (119.4 kg)  05/10/17 262 lb 9.1 oz (119.1 kg)  04/17/17 268 lb (121.6 kg)      Studies/Labs Reviewed:  EKG:  EKG is not ordered today.   Recent Labs: 02/07/2017: TSH  0.855 05/05/2017: ALT 20; B Natriuretic Peptide 280.0 05/10/2017: BUN 18; Creatinine, Ser 1.14; Hemoglobin 10.8; Magnesium 2.0; Platelets 188; Potassium 4.8; Sodium 137   Lipid Panel    Component Value Date/Time   CHOL 116 02/07/2017 1516   TRIG 81 02/07/2017 1516   HDL 48 02/07/2017 1516   CHOLHDL 2.3 12/11/2015 0854   VLDL 12 12/11/2015 0854   LDLCALC 52 02/07/2017 1516    Additional studies/ records that were reviewed today include:   Cath 03/23/2017 Conclusion     Mid RCA lesion is 100% stenosed.  Ost LM to Dist LM lesion is 70% stenosed.  Prox Cx lesion is 90% stenosed.  Ost 2nd Mrg lesion is 100% stenosed.  Prox LAD-1 lesion is 90% stenosed.  Prox LAD-2 lesion is 100% stenosed.  LIMA and is normal in caliber.  The graft exhibits no disease.  SVG graft was visualized by angiography and is normal in caliber.  Left radial artery graft was visualized by angiography.   1.  Severe native three-vessel coronary artery disease with total occlusion of the RCA, severe left main stenosis, total occlusion of the LAD with severe calcification, and severe stenosis of the circumflex and its branch vessels.  2.  Status post aortocoronary bypass surgery with continued patency of the LIMA to LAD, continued patency of the free radial graft to OM with tight stenosis at the distal anastomosis involving a small caliber vessel, and continued patency of the saphenous vein graft to PDA without any significant disease.  3.  Severe aortic valve stenosis with a mean gradient of 40 mmHg.  The calculated valve area is 1.74 cm likely related to high cardiac output  4.  Moderate pulmonary hypertension with a PA pressure of 72/25 with a mean of 38   Recommendations: The patient will undergo permanent pacemaker placement for symptomatic atrial fibrillation with slow ventricular rate.  He will continue to undergo evaluation by the multidisciplinary heart team for consideration of treatment  options for his severe aortic stenosis.      Limited echo 05/10/2017 LV EF: 60% -   65% Study Conclusions  - Left ventricle: The cavity size was normal. Systolic function was   normal. The estimated ejection fraction was in the range of 60%   to 65%. Wall motion was normal; there were no regional wall   motion abnormalities. - Ventricular septum: The contour showed diastolic flattening. - Aortic valve: A TAVR bioprosthesis was present. There was no   regurgitation. Peak velocity (S): 207 cm/s. Mean gradient (S): 7   mm Hg. Valve area (VTI): 1.65 cm^2. Valve area (Vmax): 1.44 cm^2.   Valve area (Vmean): 1.77 cm^2. - Mitral valve: Moderately calcified annulus. - Pulmonary arteries: Systolic pressure was moderately to severely   increased. PA peak pressure: 65 mm Hg (S).    ASSESSMENT:    1. Coronary artery disease involving coronary bypass graft of native heart without angina pectoris   2. Essential hypertension   3. Hyperlipidemia, unspecified hyperlipidemia type   4. Carotid artery disease, unspecified laterality, unspecified type ([place])   5. Permanent atrial fibrillation ([place])   6. S/P TAVR (transcatheter aortic valve replacement)   7. Pacemaker      PLAN:  In order of problems listed above:  1. CAD s/p CABG: Patient underwent a cardiac catheterization in February 2019, medical therapy was recommended.  On aspirin.   2. Severe aortic stenosis s/p TAVR: Continue  to have some degree of shortness of breath, however improving.  He has follow-up with Nell Range of valvular clinic and has repeat echocardiogram the same day.  He was recently discharged on home oxygen, as documented in the HPI, he is ambulatory O2 saturation is actually normal without oxygen.  He no longer need home oxygen.  3. History of permanent atrial fibrillation with slow ventricular rate: S/p pacemaker.  On Eliquis 5 mg twice daily.  4. Hypertension: Pressure mildly elevated, increase amlodipine to 5  mg daily  5. Hyperlipidemia: intolerant to statins  6. Carotid artery disease: History of left carotid endarterectomy in 2014.  Stable on last ultrasound 01/10/2017.    Medication Adjustments/Labs and Tests Ordered: Current medicines are reviewed at length with the patient today.  Concerns regarding medicines are outlined above.  Medication changes, Labs and Tests ordered today are listed in the Patient Instructions below. Patient Instructions  Almyra Deforest, PA has recommended making the following medication changes: 1. INCREASE Amlodipine to 5 mg daily  Please keep your upcoming appointments in May for your echocardiogram, with Nell Range PA, and with Dr Curt Bears.  Isaac Laud recommends that you schedule a follow-up appointment in August with Dr [name].  If you need a refill on your cardiac medications before your next appointment, please call your pharmacy.    Hilbert Corrigan, [place]  05/21/2017 11:53 AM    [address], [place], [place]  [address] Phone: [phone]; Fax: [phone]

## 2017-05-21 ENCOUNTER — Encounter: Payer: Self-pay | Admitting: Physician Assistant

## 2017-05-24 ENCOUNTER — Encounter: Payer: Self-pay | Admitting: *Deleted

## 2017-05-26 ENCOUNTER — Encounter: Payer: Self-pay | Admitting: *Deleted

## 2017-05-29 ENCOUNTER — Encounter: Payer: Medicare Other | Attending: Cardiology | Admitting: *Deleted

## 2017-05-29 ENCOUNTER — Encounter: Payer: Self-pay | Admitting: *Deleted

## 2017-05-29 VITALS — Ht 72.0 in | Wt 259.0 lb

## 2017-05-29 DIAGNOSIS — E119 Type 2 diabetes mellitus without complications: Secondary | ICD-10-CM | POA: Diagnosis not present

## 2017-05-29 DIAGNOSIS — Z952 Presence of prosthetic heart valve: Secondary | ICD-10-CM

## 2017-05-29 DIAGNOSIS — Z79899 Other long term (current) drug therapy: Secondary | ICD-10-CM | POA: Diagnosis not present

## 2017-05-29 DIAGNOSIS — Z87891 Personal history of nicotine dependence: Secondary | ICD-10-CM | POA: Insufficient documentation

## 2017-05-29 DIAGNOSIS — I110 Hypertensive heart disease with heart failure: Secondary | ICD-10-CM | POA: Diagnosis not present

## 2017-05-29 DIAGNOSIS — M1711 Unilateral primary osteoarthritis, right knee: Secondary | ICD-10-CM | POA: Insufficient documentation

## 2017-05-29 DIAGNOSIS — Z7982 Long term (current) use of aspirin: Secondary | ICD-10-CM | POA: Insufficient documentation

## 2017-05-29 DIAGNOSIS — Z8673 Personal history of transient ischemic attack (TIA), and cerebral infarction without residual deficits: Secondary | ICD-10-CM | POA: Insufficient documentation

## 2017-05-29 DIAGNOSIS — E785 Hyperlipidemia, unspecified: Secondary | ICD-10-CM | POA: Diagnosis not present

## 2017-05-29 DIAGNOSIS — I509 Heart failure, unspecified: Secondary | ICD-10-CM | POA: Diagnosis not present

## 2017-05-29 NOTE — Progress Notes (Signed)
Cardiac Individual Treatment Plan  Patient Details  Name: Evan [name] MRN: [id number] Date of Birth: [birth year] Referring Provider:     Cardiac Rehab from 05/29/2017 in [facility] Cardiac and Pulmonary Rehab  Referring Provider  [name], [name] MD      Initial Encounter Date:    Cardiac Rehab from 05/29/2017 in [facility] Cardiac and Pulmonary Rehab  Date  05/29/17  Referring Provider  [name], [name] MD      Visit Diagnosis: S/P TAVR (transcatheter aortic valve replacement)  Patient's Home Medications on Admission:  Current Outpatient Medications:  .  acetaminophen (TYLENOL) 650 MG CR tablet, Take 1,300 mg by mouth every 8 (eight) hours as needed for pain., Disp: , Rfl:  .  amLODipine (NORVASC) 5 MG tablet, Take 1 tablet (5 mg total) by mouth daily., Disp: 90 tablet, Rfl: 3 .  apixaban (ELIQUIS) 5 MG TABS tablet, Take 1 tablet (5 mg total) by mouth 2 (two) times daily., Disp: 28 tablet, Rfl: 0 .  aspirin EC 81 MG EC tablet, Take 1 tablet (81 mg total) by mouth daily., Disp: , Rfl:  .  benazepril (LOTENSIN) 40 MG tablet, Take 1 tablet (40 mg total) by mouth daily., Disp: 90 tablet, Rfl: 3 .  Cyanocobalamin (RA VITAMIN B-12 TR) 1000 MCG TBCR, Take 1,000 mcg by mouth daily. , Disp: , Rfl:  .  ezetimibe (ZETIA) 10 MG tablet, Take 1 tablet (10 mg total) by mouth daily. (Patient taking differently: Take 10 mg by mouth every evening. ), Disp: 30 tablet, Rfl: 11 .  fluticasone (FLONASE) 50 MCG/ACT nasal spray, Place 2 sprays into both nostrils daily., Disp: , Rfl:  .  glimepiride (AMARYL) 2 MG tablet, Take 2 mg by mouth daily with breakfast., Disp: , Rfl:  .  hydrochlorothiazide (HYDRODIURIL) 25 MG tablet, Take 1 tablet (25 mg total) by mouth every morning., Disp: 90 tablet, Rfl: 1 .  OVER THE COUNTER MEDICATION, Replenex tablets: Take 3 tablet by mouth every morning (joint health), Disp: , Rfl:  .  Polyvinyl Alcohol-Povidone (REFRESH OP), Place 2 drops into both eyes daily as needed (for dry eyes). , Disp:  , Rfl:  .  rosuvastatin (CRESTOR) 20 MG tablet, Take 1 tablet (20 mg total) by mouth daily. (Patient taking differently: Take 20 mg by mouth every evening. ), Disp: 30 tablet, Rfl: 0 .  tamsulosin (FLOMAX) 0.4 MG CAPS capsule, TAKE 1 CAPSULE BY MOUTH ONCE DAILY (Patient taking differently: TAKE 0.4 MG BY MOUTH ONCE DAILY IN THE EVENING), Disp: 90 capsule, Rfl: 3 .  Camphor-Menthol-Methyl Sal (MUSCLE RUB ULTRA STRENGTH) 05-03-28 % CREA, Apply 1 application topically 3 (three) times daily as needed (muscle pain)., Disp: , Rfl:  .  guaiFENesin-codeine 100-10 MG/5ML syrup, Take by mouth., Disp: , Rfl:   Past Medical History: Past Medical History:  Diagnosis Date  . Anemia   . Carotid arterial disease ([place])   . CHF (congestive heart failure) ([place])   . Coronary artery disease   . HOH (hard of hearing)   . Hyperlipidemia   . Hypertension   . Nocturia   . Obesity   . Osteoarthritis    "BUE; BLE; right knee" (03/23/2017)  . Peripheral vision loss, bilateral    S/P 07/2015  . Permanent atrial fibrillation ([place])   . 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 Severe aortic stenosis   . Sinus drainage   . Stroke [facility]) 07/2015   "peripheral vision still bad out of left eye" (03/23/2017)  . Torn rotator cuff  2012   Right arm  . Type II diabetes mellitus ([place])   . Urinary frequency     Tobacco Use: Social History   Tobacco Use  Smoking Status Former Smoker  . Packs/day: 2.00  . Years: 39.00  . Pack years: 78.00  . Types: Cigarettes  . Last attempt to quit: 01/24/1994  . Years since quitting: 23.3  Smokeless Tobacco Never Used    Labs: Recent Chemical engineer    Labs for ITP Cardiac and Pulmonary Rehab Latest Ref Rng & Units 05/05/2017 05/09/2017 05/09/2017 05/09/2017 05/09/2017   Cholestrol 100 - 199 mg/dL - - - - -   LDLCALC 0 - 99 mg/dL - - - - -   HDL >39 mg/dL - - - - -   Trlycerides 0 - 149 mg/dL - - - - -   Hemoglobin A1c 4.8 - 5.6 % 6.8(H) - - - -   PHART 7.350 -  7.450 7.445 - 7.310(L) - 7.337(L)   PCO2ART 32.0 - 48.0 mmHg 34.1 - 52.0(H) - 46.0   HCO3 20.0 - 28.0 mmol/L 23.1 - 26.1 - 24.7   TCO2 22 - 32 mmol/L - _0 ACIDBASEDEF 0.0 - 2.0 mmol/L 0.5 - 1.0 - 1.0   O2SAT % 98.3 - 99.0 - 99.0       Exercise Target Goals: Date: 05/29/17  Exercise Program Goal: Individual exercise prescription set using results from initial 6 min walk test and THRR while considering  patient's activity barriers and safety.   Exercise Prescription Goal: Initial exercise prescription builds to 30-45 minutes a day of aerobic activity, 2-3 days per week.  Home exercise guidelines will be given to patient during program as part of exercise prescription that the participant will acknowledge.  Activity Barriers & Risk Stratification: Activity Barriers & Cardiac Risk Stratification - 05/29/17 1416      Activity Barriers & Cardiac Risk Stratification   Activity Barriers  Joint Problems;Muscular Weakness;Shortness of Breath;Other (comment);Deconditioning;Back Problems;Arthritis;Balance Concerns    Comments  both arms- issues lifting and griping , arthritis in both knees (left side a little worse)    Cardiac Risk Stratification  Moderate       6 Minute Walk: 6 Minute Walk    Row Name 05/29/17 1431         6 Minute Walk   Phase  Initial     Distance  980 feet     Walk Time  6 minutes     # of Rest Breaks  0     MPH  1.86     METS  1.8     RPE  13     Perceived Dyspnea   3     VO2 Peak  6.32     Symptoms  Yes (comment)     Comments  hip/leg pain 5/10, SOB     Resting HR  64 bpm     Resting BP  128/62     Resting Oxygen Saturation   97 %     Exercise Oxygen Saturation  during 6 min walk  96 %     Max Ex. HR  89 bpm     Max Ex. BP  148/74     2 Minute Post BP  132/60        Oxygen Initial Assessment:   Oxygen Re-Evaluation:   Oxygen Discharge (Final Oxygen Re-Evaluation):   Initial Exercise Prescription: Initial Exercise Prescription -  05/29/17 1400  Date of Initial Exercise RX and Referring Provider   Date  05/29/17    Referring Provider  [name], [name] MD      Treadmill   MPH  1.1    Grade  0    Minutes  15    METs  1.84      Recumbant Bike   Level  1    RPM  40    Minutes  15    METs  1.8      T5 Nustep   Level  1    SPM  80    Minutes  15    METs  1.8      Prescription Details   Frequency (times per week)  2    Duration  Progress to 30 minutes of continuous aerobic without signs/symptoms of physical distress      Intensity   THRR 40-80% of Max Heartrate  98-131    Ratings of Perceived Exertion  11-13    Perceived Dyspnea  0-4      Progression   Progression  Continue to progress workloads to maintain intensity without signs/symptoms of physical distress.      Resistance Training   Training Prescription  Yes    Weight  3 lbs    Reps  10-15       Perform Capillary Blood Glucose checks as needed.  Exercise Prescription Changes: Exercise Prescription Changes    Row Name 05/29/17 1400             Response to Exercise   Blood Pressure (Admit)  128/62       Blood Pressure (Exercise)  148/74       Blood Pressure (Exit)  132/60       Heart Rate (Admit)  64 bpm       Heart Rate (Exercise)  89 bpm       Heart Rate (Exit)  53 bpm       Oxygen Saturation (Admit)  97 %       Oxygen Saturation (Exercise)  96 %       Rating of Perceived Exertion (Exercise)  13       Perceived Dyspnea (Exercise)  3       Symptoms  hip/leg pain 5/10, SOB       Comments  walk test results          Exercise Comments:   Exercise Goals and Review: Exercise Goals    Row Name 05/29/17 1443             Exercise Goals   Increase Physical Activity  Yes       Intervention  Provide advice, education, support and counseling about physical activity/exercise needs.;Develop an individualized exercise prescription for aerobic and resistive training based on initial evaluation findings, risk stratification,  comorbidities and participant's personal goals.       Expected Outcomes  Short Term: Attend rehab on a regular basis to increase amount of physical activity.;Long Term: Exercising regularly at least 3-5 days a week.;Long Term: Add in home exercise to make exercise part of routine and to increase amount of physical activity.       Increase Strength and Stamina  Yes       Intervention  Provide advice, education, support and counseling about physical activity/exercise needs.;Develop an individualized exercise prescription for aerobic and resistive training based on initial evaluation findings, risk stratification, comorbidities and participant's personal goals.       Expected Outcomes  Short Term: Increase  workloads from initial exercise prescription for resistance, speed, and METs.;Short Term: Perform resistance training exercises routinely during rehab and add in resistance training at home;Long Term: Improve cardiorespiratory fitness, muscular endurance and strength as measured by increased METs and functional capacity (6MWT)       Able to understand and use rate of perceived exertion (RPE) scale  Yes       Intervention  Provide education and explanation on how to use RPE scale       Expected Outcomes  Short Term: Able to use RPE daily in rehab to express subjective intensity level;Long Term:  Able to use RPE to guide intensity level when exercising independently       Knowledge and understanding of Target Heart Rate Range (THRR)  Yes       Intervention  Provide education and explanation of THRR including how the numbers were predicted and where they are located for reference       Expected Outcomes  Short Term: Able to state/look up THRR;Short Term: Able to use daily as guideline for intensity in rehab;Long Term: Able to use THRR to govern intensity when exercising independently       Able to check pulse independently  Yes       Intervention  Provide education and demonstration on how to check pulse in  carotid and radial arteries.;Review the importance of being able to check your own pulse for safety during independent exercise       Expected Outcomes  Short Term: Able to explain why pulse checking is important during independent exercise;Long Term: Able to check pulse independently and accurately       Understanding of Exercise Prescription  Yes       Intervention  Provide education, explanation, and written materials on patient's individual exercise prescription       Expected Outcomes  Short Term: Able to explain program exercise prescription;Long Term: Able to explain home exercise prescription to exercise independently          Exercise Goals Re-Evaluation :   Discharge Exercise Prescription (Final Exercise Prescription Changes): Exercise Prescription Changes - 05/29/17 1400      Response to Exercise   Blood Pressure (Admit)  128/62    Blood Pressure (Exercise)  148/74    Blood Pressure (Exit)  132/60    Heart Rate (Admit)  64 bpm    Heart Rate (Exercise)  89 bpm    Heart Rate (Exit)  53 bpm    Oxygen Saturation (Admit)  97 %    Oxygen Saturation (Exercise)  96 %    Rating of Perceived Exertion (Exercise)  13    Perceived Dyspnea (Exercise)  3    Symptoms  hip/leg pain 5/10, SOB    Comments  walk test results       Nutrition:  Target Goals: Understanding of nutrition guidelines, daily intake of sodium <1527m, cholesterol <2016m calories 30% from fat and 7% or less from saturated fats, daily to have 5 or more servings of fruits and vegetables.  Biometrics: Pre Biometrics - 05/29/17 1514      Pre Biometrics   Height  6' (1.829 m)    Weight  259 lb (117.5 kg)    Waist Circumference  45.25 inches    Hip Circumference  46 inches    Waist to Hip Ratio  0.98 %    BMI (Calculated)  35.12    Single Leg Stand  2.51 seconds        Nutrition Therapy Plan  and Nutrition Goals: Nutrition Therapy & Goals - 05/29/17 1406      Intervention Plan   Intervention  Prescribe,  educate and counsel regarding individualized specific dietary modifications aiming towards targeted core components such as weight, hypertension, lipid management, diabetes, heart failure and other comorbidities.;Nutrition handout(s) given to patient.    Expected Outcomes  Short Term Goal: Understand basic principles of dietary content, such as calories, fat, sodium, cholesterol and nutrients.;Short Term Goal: A plan has been developed with personal nutrition goals set during dietitian appointment.;Long Term Goal: Adherence to prescribed nutrition plan.       Nutrition Assessments: Nutrition Assessments - 05/29/17 1406      MEDFICTS Scores   Pre Score  91       Nutrition Goals Re-Evaluation:   Nutrition Goals Discharge (Final Nutrition Goals Re-Evaluation):   Psychosocial: Target Goals: Acknowledge presence or absence of significant depression and/or stress, maximize coping skills, provide positive support system. Participant is able to verbalize types and ability to use techniques and skills needed for reducing stress and depression.   Initial Review & Psychosocial Screening: Initial Psych Review & Screening - 05/29/17 1406      Initial Review   Current issues with  Current Sleep Concerns;Current Stress Concerns    Source of Stress Concerns  Unable to perform yard/household activities;Unable to participate in former interests or hobbies      [place]?  Yes spouse      Barriers   Psychosocial barriers to participate in program  There are no identifiable barriers or psychosocial needs.;The patient should benefit from training in stress management and relaxation.      Screening Interventions   Interventions  Encouraged to exercise;Provide feedback about the scores to participant;Program counselor consult;To provide support and resources with identified psychosocial needs    Expected Outcomes  Short Term goal: Utilizing psychosocial counselor, staff and  physician to assist with identification of specific Stressors or current issues interfering with healing process. Setting desired goal for each stressor or current issue identified.;Long Term Goal: Stressors or current issues are controlled or eliminated.;Short Term goal: Identification and review with participant of any Quality of Life or Depression concerns found by scoring the questionnaire.;Long Term goal: The participant improves quality of Life and PHQ9 Scores as seen by post scores and/or verbalization of changes       Quality of Life Scores:  Quality of Life - 05/29/17 1042      Quality of Life Scores   Health/Function Pre  22.65 %    Socioeconomic Pre  27.83 %    Psych/Spiritual Pre  30 %    Family Pre  28.5 %    GLOBAL Pre  26.18 %      Scores of 19 and below usually indicate a poorer quality of life in these areas.  A difference of  2-3 points is a clinically meaningful difference.  A difference of 2-3 points in the total score of the Quality of Life Index has been associated with significant improvement in overall quality of life, self-image, physical symptoms, and general health in studies assessing change in quality of life.  PHQ-9: Recent Review Flowsheet Data    Depression screen [facility] 2/9 05/29/2017   Decreased Interest 1   Down, Depressed, Hopeless 0   PHQ - 2 Score 1   Altered sleeping 2   Tired, decreased energy 1   Change in appetite 0   Feeling bad or failure about yourself  0  Trouble concentrating 2   Moving slowly or fidgety/restless 0   Suicidal thoughts 0   PHQ-9 Score 6   Difficult doing work/chores Somewhat difficult     Interpretation of Total Score  Total Score Depression Severity:  1-4 = Minimal depression, 5-9 = Mild depression, 10-14 = Moderate depression, 15-19 = Moderately severe depression, 20-27 = Severe depression   Psychosocial Evaluation and Intervention:   Psychosocial Re-Evaluation:   Psychosocial Discharge (Final Psychosocial  Re-Evaluation):   Vocational Rehabilitation: Provide vocational rehab assistance to qualifying candidates.   Vocational Rehab Evaluation & Intervention: Vocational Rehab - 05/29/17 1415      Initial Vocational Rehab Evaluation & Intervention   Assessment shows need for Vocational Rehabilitation  No       Education: Education Goals: Education classes will be provided on a variety of topics geared toward better understanding of heart health and risk factor modification. Participant will state understanding/return demonstration of topics presented as noted by education test scores.  Learning Barriers/Preferences: Learning Barriers/Preferences - 05/29/17 1407      Learning Barriers/Preferences   Learning Barriers  Hearing;Sight    Learning Preferences  Individual Instruction;Skilled Demonstration;Video       Education Topics:  AED/CPR: - Group verbal and written instruction with the use of models to demonstrate the basic use of the AED with the basic ABC's of resuscitation.   General Nutrition Guidelines/Fats and Fiber: -Group instruction provided by verbal, written material, models and posters to present the general guidelines for heart healthy nutrition. Gives an explanation and review of dietary fats and fiber.   Controlling Sodium/Reading Food Labels: -Group verbal and written material supporting the discussion of sodium use in heart healthy nutrition. Review and explanation with models, verbal and written materials for utilization of the food label.   Exercise Physiology & General Exercise Guidelines: - Group verbal and written instruction with models to review the exercise physiology of the cardiovascular system and associated critical values. Provides general exercise guidelines with specific guidelines to those with heart or lung disease.    Aerobic Exercise & Resistance Training: - Gives group verbal and written instruction on the various components of exercise.  Focuses on aerobic and resistive training programs and the benefits of this training and how to safely progress through these programs..   Flexibility, Balance, Mind/Body Relaxation: Provides group verbal/written instruction on the benefits of flexibility and balance training, including mind/body exercise modes such as yoga, pilates and tai chi.  Demonstration and skill practice provided.   Stress and Anxiety: - Provides group verbal and written instruction about the health risks of elevated stress and causes of high stress.  Discuss the correlation between heart/lung disease and anxiety and treatment options. Review healthy ways to manage with stress and anxiety.   Depression: - Provides group verbal and written instruction on the correlation between heart/lung disease and depressed mood, treatment options, and the stigmas associated with seeking treatment.   Anatomy & Physiology of the Heart: - Group verbal and written instruction and models provide basic cardiac anatomy and physiology, with the coronary electrical and arterial systems. Review of Valvular disease and Heart Failure   Cardiac Procedures: - Group verbal and written instruction to review commonly prescribed medications for heart disease. Reviews the medication, class of the drug, and side effects. Includes the steps to properly store meds and maintain the prescription regimen. (beta blockers and nitrates)   Cardiac Medications I: - Group verbal and written instruction to review commonly prescribed medications for heart disease. Reviews the  medication, class of the drug, and side effects. Includes the steps to properly store meds and maintain the prescription regimen.   Cardiac Medications II: -Group verbal and written instruction to review commonly prescribed medications for heart disease. Reviews the medication, class of the drug, and side effects. (all other drug classes)    Go Sex-Intimacy & Heart Disease, Get SMART  - Goal Setting: - Group verbal and written instruction through game format to discuss heart disease and the return to sexual intimacy. Provides group verbal and written material to discuss and apply goal setting through the application of the S.M.A.R.T. Method.   Other Matters of the Heart: - Provides group verbal, written materials and models to describe Stable Angina and Peripheral Artery. Includes description of the disease process and treatment options available to the cardiac patient.   Exercise & Equipment Safety: - Individual verbal instruction and demonstration of equipment use and safety with use of the equipment.   Cardiac Rehab from 05/29/2017 in [facility] Cardiac and Pulmonary Rehab  Date  05/29/17  Educator  [facility]  Instruction Review Code  1- Verbalizes Understanding      Infection Prevention: - Provides verbal and written material to individual with discussion of infection control including proper hand washing and proper equipment cleaning during exercise session.   Cardiac Rehab from 05/29/2017 in [facility] Cardiac and Pulmonary Rehab  Date  05/29/17  Educator  [facility]  Instruction Review Code  1- Verbalizes Understanding      Falls Prevention: - Provides verbal and written material to individual with discussion of falls prevention and safety.   Cardiac Rehab from 05/29/2017 in [facility] Cardiac and Pulmonary Rehab  Date  05/29/17  Educator  [facility]  Instruction Review Code  1- Verbalizes Understanding      Diabetes: - Individual verbal and written instruction to review signs/symptoms of diabetes, desired ranges of glucose level fasting, after meals and with exercise. Acknowledge that pre and post exercise glucose checks will be done for 3 sessions at entry of program.   Cardiac Rehab from 05/29/2017 in [facility] Cardiac and Pulmonary Rehab  Date  05/29/17  Educator  [facility]  Instruction Review Code  1- Verbalizes Understanding      Know Your Numbers and Risk Factors: -Group verbal and written  instruction about important numbers in your health.  Discussion of what are risk factors and how they play a role in the disease process.  Review of Cholesterol, Blood Pressure, Diabetes, and BMI and the role they play in your overall health.   Sleep Hygiene: -Provides group verbal and written instruction about how sleep can affect your health.  Define sleep hygiene, discuss sleep cycles and impact of sleep habits. Review good sleep hygiene tips.    Other: -Provides group and verbal instruction on various topics (see comments)   Knowledge Questionnaire Score: Knowledge Questionnaire Score - 05/29/17 1041      Knowledge Questionnaire Score   Pre Score  19/28 results and test reviewed with pt and spouse today       Core Components/Risk Factors/Patient Goals at Admission: Personal Goals and Risk Factors at Admission - 05/29/17 1402      Core Components/Risk Factors/Patient Goals on Admission    Weight Management  Yes;Weight Loss;Obesity    Intervention  Weight Management: Develop a combined nutrition and exercise program designed to reach desired caloric intake, while maintaining appropriate intake of nutrient and fiber, sodium and fats, and appropriate energy expenditure required for the weight goal.;Weight Management: Provide education and appropriate resources  to help participant work on and attain dietary goals.;Weight Management/Obesity: Establish reasonable short term and long term weight goals.;Obesity: Provide education and appropriate resources to help participant work on and attain dietary goals.    Admit Weight  259 lb (117.5 kg)    Goal Weight: Short Term  254 lb (115.2 kg)    Goal Weight: Long Term  230 lb (104.3 kg)    Expected Outcomes  Short Term: Continue to assess and modify interventions until short term weight is achieved;Long Term: Adherence to nutrition and physical activity/exercise program aimed toward attainment of established weight goal;Weight Loss: Understanding  of general recommendations for a balanced deficit meal plan, which promotes 1-2 lb weight loss per week and includes a negative energy balance of [phone] kcal/d;Understanding recommendations for meals to include 15-35% energy as protein, 25-35% energy from fat, 35-60% energy from carbohydrates, less than 261m of dietary cholesterol, 20-35 gm of total fiber daily;Understanding of distribution of calorie intake throughout the day with the consumption of 4-5 meals/snacks    Diabetes  Yes    Intervention  Provide education about signs/symptoms and action to take for hypo/hyperglycemia.;Provide education about proper nutrition, including hydration, and aerobic/resistive exercise prescription along with prescribed medications to achieve blood glucose in normal ranges: Fasting glucose 65-99 mg/dL    Expected Outcomes  Short Term: Participant verbalizes understanding of the signs/symptoms and immediate care of hyper/hypoglycemia, proper foot care and importance of medication, aerobic/resistive exercise and nutrition plan for blood glucose control.;Long Term: Attainment of HbA1C < 7%.    Hypertension  Yes    Intervention  Provide education on lifestyle modifcations including regular physical activity/exercise, weight management, moderate sodium restriction and increased consumption of fresh fruit, vegetables, and low fat dairy, alcohol moderation, and smoking cessation.;Monitor prescription use compliance.    Expected Outcomes  Short Term: Continued assessment and intervention until BP is < 140/955mHG in hypertensive participants. < 130/8066mG in hypertensive participants with diabetes, heart failure or chronic kidney disease.;Long Term: Maintenance of blood pressure at goal levels.    Lipids  Yes    Intervention  Provide education and support for participant on nutrition & aerobic/resistive exercise along with prescribed medications to achieve LDL <74m81mDL >40mg2m Expected Outcomes  Short Term: Participant  states understanding of desired cholesterol values and is compliant with medications prescribed. Participant is following exercise prescription and nutrition guidelines.;Long Term: Cholesterol controlled with medications as prescribed, with individualized exercise RX and with personalized nutrition plan. Value goals: LDL < 74mg,44m > 40 mg.       Core Components/Risk Factors/Patient Goals Review:    Core Components/Risk Factors/Patient Goals at Discharge (Final Review):    ITP Comments: ITP Comments    Row Name 05/29/17 1358           ITP Comments  Med Review completed. Initial ITP created. [facility]          Comments: Initial ITP

## 2017-05-29 NOTE — Patient Instructions (Signed)
Patient Instructions  Patient Details  Name: Evan [name] MRN: [id number] Date of Birth: 10/13/1940 Referring Provider:  [name], [name] M, MD  Below are your personal goals for exercise, nutrition, and risk factors. Our goal is to help you stay on track towards obtaining and maintaining these goals. We will be discussing your progress on these goals with you throughout the program.  Initial Exercise Prescription: Initial Exercise Prescription - 05/29/17 1400      Date of Initial Exercise RX and Referring Provider   Date  05/29/17    Referring Provider  [name], [name] MD      Treadmill   MPH  1.1    Grade  0    Minutes  15    METs  1.84      Recumbant Bike   Level  1    RPM  40    Minutes  15    METs  1.8      T5 Nustep   Level  1    SPM  80    Minutes  15    METs  1.8      Prescription Details   Frequency (times per week)  2    Duration  Progress to 30 minutes of continuous aerobic without signs/symptoms of physical distress      Intensity   THRR 40-80% of Max Heartrate  98-131    Ratings of Perceived Exertion  11-13    Perceived Dyspnea  0-4      Progression   Progression  Continue to progress workloads to maintain intensity without signs/symptoms of physical distress.      Resistance Training   Training Prescription  Yes    Weight  3 lbs    Reps  10-15       Exercise Goals: Frequency: Be able to perform aerobic exercise two to three times per week in program working toward 2-5 days per week of home exercise.  Intensity: Work with a perceived exertion of 11 (fairly light) - 15 (hard) while following your exercise prescription.  We will make changes to your prescription with you as you progress through the program.   Duration: Be able to do 30 to 45 minutes of continuous aerobic exercise in addition to a 5 minute warm-up and a 5 minute cool-down routine.   Nutrition Goals: Your personal nutrition goals will be established when you do your nutrition analysis  with the dietician.  The following are general nutrition guidelines to follow: Cholesterol < 200mg /day Sodium < 1500mg /day Fiber: Men over 50 yrs - 30 grams per day  Personal Goals: Personal Goals and Risk Factors at Admission - 05/29/17 1402      Core Components/Risk Factors/Patient Goals on Admission    Weight Management  Yes;Weight Loss;Obesity    Intervention  Weight Management: Develop a combined nutrition and exercise program designed to reach desired caloric intake, while maintaining appropriate intake of nutrient and fiber, sodium and fats, and appropriate energy expenditure required for the weight goal.;Weight Management: Provide education and appropriate resources to help participant work on and attain dietary goals.;Weight Management/Obesity: Establish reasonable short term and long term weight goals.;Obesity: Provide education and appropriate resources to help participant work on and attain dietary goals.    Admit Weight  259 lb (117.5 kg)    Goal Weight: Short Term  254 lb (115.2 kg)    Goal Weight: Long Term  230 lb (104.3 kg)    Expected Outcomes  Short Term: Continue to assess and modify  interventions until short term weight is achieved;Long Term: Adherence to nutrition and physical activity/exercise program aimed toward attainment of established weight goal;Weight Loss: Understanding of general recommendations for a balanced deficit meal plan, which promotes 1-2 lb weight loss per week and includes a negative energy balance of [phone] kcal/d;Understanding recommendations for meals to include 15-35% energy as protein, 25-35% energy from fat, 35-60% energy from carbohydrates, less than 200mg  of dietary cholesterol, 20-35 gm of total fiber daily;Understanding of distribution of calorie intake throughout the day with the consumption of 4-5 meals/snacks    Diabetes  Yes    Intervention  Provide education about signs/symptoms and action to take for hypo/hyperglycemia.;Provide education  about proper nutrition, including hydration, and aerobic/resistive exercise prescription along with prescribed medications to achieve blood glucose in normal ranges: Fasting glucose 65-99 mg/dL    Expected Outcomes  Short Term: Participant verbalizes understanding of the signs/symptoms and immediate care of hyper/hypoglycemia, proper foot care and importance of medication, aerobic/resistive exercise and nutrition plan for blood glucose control.;Long Term: Attainment of HbA1C < 7%.    Hypertension  Yes    Intervention  Provide education on lifestyle modifcations including regular physical activity/exercise, weight management, moderate sodium restriction and increased consumption of fresh fruit, vegetables, and low fat dairy, alcohol moderation, and smoking cessation.;Monitor prescription use compliance.    Expected Outcomes  Short Term: Continued assessment and intervention until BP is < 140/66mm HG in hypertensive participants. < 130/12mm HG in hypertensive participants with diabetes, heart failure or chronic kidney disease.;Long Term: Maintenance of blood pressure at goal levels.    Lipids  Yes    Intervention  Provide education and support for participant on nutrition & aerobic/resistive exercise along with prescribed medications to achieve LDL 70mg , HDL >40mg .    Expected Outcomes  Short Term: Participant states understanding of desired cholesterol values and is compliant with medications prescribed. Participant is following exercise prescription and nutrition guidelines.;Long Term: Cholesterol controlled with medications as prescribed, with individualized exercise RX and with personalized nutrition plan. Value goals: LDL < 70mg , HDL > 40 mg.       Tobacco Use Initial Evaluation: Social History   Tobacco Use  Smoking Status Former Smoker  . Packs/day: 2.00  . Years: 39.00  . Pack years: 78.00  . Types: Cigarettes  . Last attempt to quit: 01/24/1994  . Years since quitting: 23.3  Smokeless  Tobacco Never Used    Exercise Goals and Review: Exercise Goals    Row Name 05/29/17 1443             Exercise Goals   Increase Physical Activity  Yes       Intervention  Provide advice, education, support and counseling about physical activity/exercise needs.;Develop an individualized exercise prescription for aerobic and resistive training based on initial evaluation findings, risk stratification, comorbidities and participant's personal goals.       Expected Outcomes  Short Term: Attend rehab on a regular basis to increase amount of physical activity.;Long Term: Exercising regularly at least 3-5 days a week.;Long Term: Add in home exercise to make exercise part of routine and to increase amount of physical activity.       Increase Strength and Stamina  Yes       Intervention  Provide advice, education, support and counseling about physical activity/exercise needs.;Develop an individualized exercise prescription for aerobic and resistive training based on initial evaluation findings, risk stratification, comorbidities and participant's personal goals.       Expected Outcomes  Short Term: Increase  workloads from initial exercise prescription for resistance, speed, and METs.;Short Term: Perform resistance training exercises routinely during rehab and add in resistance training at home;Long Term: Improve cardiorespiratory fitness, muscular endurance and strength as measured by increased METs and functional capacity (6MWT)       Able to understand and use rate of perceived exertion (RPE) scale  Yes       Intervention  Provide education and explanation on how to use RPE scale       Expected Outcomes  Short Term: Able to use RPE daily in rehab to express subjective intensity level;Long Term:  Able to use RPE to guide intensity level when exercising independently       Knowledge and understanding of Target Heart Rate Range (THRR)  Yes       Intervention  Provide education and explanation of THRR  including how the numbers were predicted and where they are located for reference       Expected Outcomes  Short Term: Able to state/look up THRR;Short Term: Able to use daily as guideline for intensity in rehab;Long Term: Able to use THRR to govern intensity when exercising independently       Able to check pulse independently  Yes       Intervention  Provide education and demonstration on how to check pulse in carotid and radial arteries.;Review the importance of being able to check your own pulse for safety during independent exercise       Expected Outcomes  Short Term: Able to explain why pulse checking is important during independent exercise;Long Term: Able to check pulse independently and accurately       Understanding of Exercise Prescription  Yes       Intervention  Provide education, explanation, and written materials on patient's individual exercise prescription       Expected Outcomes  Short Term: Able to explain program exercise prescription;Long Term: Able to explain home exercise prescription to exercise independently          Copy of goals given to participant.

## 2017-06-01 ENCOUNTER — Encounter: Payer: Medicare Other | Admitting: *Deleted

## 2017-06-01 DIAGNOSIS — Z952 Presence of prosthetic heart valve: Secondary | ICD-10-CM

## 2017-06-01 LAB — GLUCOSE, CAPILLARY
GLUCOSE-CAPILLARY: 125 mg/dL — AB (ref 65–99)
Glucose-Capillary: 185 mg/dL — ABNORMAL HIGH (ref 65–99)

## 2017-06-01 NOTE — Progress Notes (Signed)
Daily Session Note  Patient Details  Name: Evan [name] MRN: [id number] Date of Birth: 1940/04/09 Referring Provider:     Cardiac Rehab from 05/29/2017 in [facility] Cardiac and Pulmonary Rehab  Referring Provider  [name], [name] MD      Encounter Date: 06/01/2017  Check In: Session Check In - 06/01/17 0819      Check-In   Location  [facility]-Cardiac & Pulmonary Rehab    Staff Present  Alberteen Sam, MA, RCEP, CCRP, Exercise Physiologist;Amanda Oletta Darter, BA, ACSM CEP, Exercise Physiologist;Carroll Enterkin, RN, BSN    Supervising physician immediately available to respond to emergencies  See telemetry face sheet for immediately available ER MD    Fall or balance concerns reported     No    Warm-up and Cool-down  Performed on first and last piece of equipment    Resistance Training Performed  Yes    VAD Patient?  No      Pain Assessment   Currently in Pain?  No/denies          Social History   Tobacco Use  Smoking Status Former Smoker  . Packs/day: 2.00  . Years: 39.00  . Pack years: 78.00  . Types: Cigarettes  . Last attempt to quit: 01/24/1994  . Years since quitting: 23.3  Smokeless Tobacco Never Used    Goals Met:  Exercise tolerated well Personal goals reviewed No report of cardiac concerns or symptoms Strength training completed today  Goals Unmet:  Not Applicable  Comments: First full day of exercise!  Patient was oriented to gym and equipment including functions, settings, policies, and procedures.  Patient's individual exercise prescription and treatment plan were reviewed.  All starting workloads were established based on the results of the 6 minute walk test done at initial orientation visit.  The plan for exercise progression was also introduced and progression will be customized based on patient's performance and goals.    Dr. Emily Filbert is Medical Director for [facility] and [facility].

## 2017-06-06 DIAGNOSIS — Z952 Presence of prosthetic heart valve: Secondary | ICD-10-CM

## 2017-06-06 LAB — GLUCOSE, CAPILLARY
GLUCOSE-CAPILLARY: 165 mg/dL — AB (ref 65–99)
Glucose-Capillary: 205 mg/dL — ABNORMAL HIGH (ref 65–99)

## 2017-06-06 NOTE — Progress Notes (Signed)
Daily Session Note  Patient Details  Name: Evan [name] MRN: [id number] Date of Birth: 1940-01-26 Referring Provider:     Cardiac Rehab from 05/29/2017 in [facility] Cardiac and Pulmonary Rehab  Referring Provider  [name], [name] MD      Encounter Date: 06/06/2017  Check In: Session Check In - 06/06/17 0907      Check-In   Location  [facility]-Cardiac & Pulmonary Rehab    Staff Present  Heath Lark, RN, BSN, CCRP;Jessica [place], MA, RCEP, CCRP, Exercise Physiologist;Amanda Oletta Darter, [place], ACSM CEP, Exercise Physiologist    Supervising physician immediately available to respond to emergencies  See telemetry face sheet for immediately available ER MD    Medication changes reported      No    Fall or balance concerns reported     No    Warm-up and Cool-down  Performed on first and last piece of equipment    Resistance Training Performed  Yes    VAD Patient?  No      Pain Assessment   Currently in Pain?  No/denies    Multiple Pain Sites  No          Social History   Tobacco Use  Smoking Status Former Smoker  . Packs/day: 2.00  . Years: 39.00  . Pack years: 78.00  . Types: Cigarettes  . Last attempt to quit: 01/24/1994  . Years since quitting: 23.3  Smokeless Tobacco Never Used    Goals Met:  Independence with exercise equipment Exercise tolerated well No report of cardiac concerns or symptoms Strength training completed today  Goals Unmet:  Not Applicable  Comments: Pt able to follow exercise prescription today without complaint.  Will continue to monitor for progression.    Dr. Emily Filbert is Medical Director for [facility] and [facility].

## 2017-06-08 ENCOUNTER — Telehealth: Payer: Self-pay | Admitting: Cardiology

## 2017-06-08 DIAGNOSIS — Z952 Presence of prosthetic heart valve: Secondary | ICD-10-CM | POA: Diagnosis not present

## 2017-06-08 LAB — GLUCOSE, CAPILLARY
GLUCOSE-CAPILLARY: 209 mg/dL — AB (ref 65–99)
GLUCOSE-CAPILLARY: 149 mg/dL — AB (ref 65–99)

## 2017-06-08 NOTE — Telephone Encounter (Signed)
Patient calling the office for samples of medication: ° ° °1.  What medication and dosage are you requesting samples for?   Eliquis 5 mg  °2.  Are you currently out of this medication? No  ° ° °

## 2017-06-08 NOTE — Progress Notes (Signed)
Daily Session Note  Patient Details  Name: Evan [name] MRN: [id number] Date of Birth: [birth year] Referring Provider:     Cardiac Rehab from 05/29/2017 in [facility] Cardiac and Pulmonary Rehab  Referring Provider  [name], [name] MD      Encounter Date: 06/08/2017  Check In: Session Check In - 06/08/17 0859      Check-In   Location  [facility]-Cardiac & Pulmonary Rehab    Staff Present  Gerlene Burdock, RN, BSN;Jessica Luan Pulling, MA, RCEP, CCRP, Exercise Physiologist;Amanda Oletta Darter, [place], ACSM CEP, Exercise Physiologist    Supervising physician immediately available to respond to emergencies  See telemetry face sheet for immediately available ER MD    Medication changes reported      No    Fall or balance concerns reported     No    Warm-up and Cool-down  Performed on first and last piece of equipment    Resistance Training Performed  Yes    VAD Patient?  No      Pain Assessment   Currently in Pain?  No/denies    Multiple Pain Sites  No          Social History   Tobacco Use  Smoking Status Former Smoker  . Packs/day: 2.00  . Years: 39.00  . Pack years: 78.00  . Types: Cigarettes  . Last attempt to quit: 01/24/1994  . Years since quitting: 23.3  Smokeless Tobacco Never Used    Goals Met:  Independence with exercise equipment Exercise tolerated well No report of cardiac concerns or symptoms Strength training completed today  Goals Unmet:  Not Applicable  Comments: Pt able to follow exercise prescription today without complaint.  Will continue to monitor for progression.    Dr. Emily Filbert is Medical Director for [facility].

## 2017-06-08 NOTE — Telephone Encounter (Signed)
Returned call to pt and informed samples are available for pick up at front desk.   Eliquis 5 mg Qty: 2 box Lot# [id number] Exp: 6/21

## 2017-06-13 DIAGNOSIS — Z952 Presence of prosthetic heart valve: Secondary | ICD-10-CM

## 2017-06-13 NOTE — Progress Notes (Signed)
[facility]                                       Cardiology Office Note    Date:  06/14/2017   ID:  Evan [name], DOB 06-27-1940, MRN [id number]  PCP:  Leonel Ramsay, MD  Cardiologist:  Dr. [name] / Dr. Curt Bears (EP) / Dr. Burt Knack & Dr. Cyndia Bent (TAVR)  CC: 1 month follow up s/p TAVR  History of Present Illness:  Evan [name] is a 77 y.o. 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   The patient has a history of coronary artery disease and multivessel CABG in 2005.  He has permanent atrial fibrillation. He had a history of stroke in 2017 with hemorrhagic transformation but has tolerated apixaban. He has a history of GI bleeding with heme positive stool. The patient had GI evaluation but refused colonoscopy. The patient also has carotid disease with a history of carotid endarterectomy and right subclavian stenosis which is been followed medically.   He reported a 6-12 month history of progressive shortness of breath and exertional fatigue occurring with low level activity like getting dressed and walking short distances. He had a 24-hour Holter monitor performed on 02/15/2017 which showed atrial fibrillation with a slow ventricular response and frequent pauses up to 5.08 seconds. An echocardiogram on 03/01/2017 showed severe aortic stenosis with a mean gradient of 38 mmHg and a valve area of 0.64 cm. DVI was 0.19. Left ventricular ejection fraction was 55-60%.  Cardiac catheterization on 03/23/2017 showed severe native three-vessel coronary disease with total occlusion of the right coronary artery, severe left main coronary stenosis, and total occlusion of the LAD.  There was severe stenosis of the  left circumflex and its branch vessels.  He had continued patency of the left internal mammary graft to the LAD, continued patency of the free radial artery graft to the obtuse marginal branch with a tight stenosis at the distal anastomosis involving a small caliber vessel. There is continued patency of the saphenous vein graft to the posterior descending artery.  The mean aortic valve gradient was 40 mmHg. He had a St. Jude PPM placed on 03/23/17 after his heart cath. He continued to have exertional SOB and was referred for TAVR.   He underwent successful TAVR with a 26 mm Edwards Sapien 3 THV on 05/09/17. Post operative echo showed a normally functioning TAVR valve with mean gradient of 7 mm Hg and no PVL. He was discharged on ASA and Eliquis.   Today he presents to clinic for follow up.  He has been doing much better since his surgery. He still gets short of breath with walking out to the car, but overall doing better. He is participating in cardiac rehab and it causes him to ache at night. No CP or SOB. No LE edema, orthopnea or PND. No dizziness or syncope. No blood in stool or urine. No palpitations.    Past Medical History:  Diagnosis Date  . Anemia   . Carotid arterial disease ([place])   . CHF (congestive heart failure) ([place])   . Coronary artery disease   .  HOH (hard of hearing)   . Hyperlipidemia   . Hypertension   . Nocturia   . Obesity   . Osteoarthritis    "BUE; BLE; right knee" (03/23/2017)  . Peripheral vision loss, bilateral    S/P 07/2015  . Permanent atrial fibrillation ([place])   . 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 Severe aortic stenosis   . Sinus drainage   . Stroke [facility]) 07/2015   "peripheral vision still bad out of left eye" (03/23/2017)  . Torn rotator cuff 2012   Right arm  . Type II diabetes mellitus ([place])   . Urinary frequency     Past Surgical History:  Procedure Laterality Date  . BACK SURGERY    . CARDIAC CATHETERIZATION  11/29/2005   SEVERE  THREE VESSEL OBSTRUCTIVE ATHERSCLEROTIC CAD. ALL GRAFTS WERE PATENT. EF 65%  . CARDIAC CATHETERIZATION  03/23/2017  . CAROTID ENDARTERECTOMY Right 01-29-08   cea  . CARPAL TUNNEL RELEASE Right   . CORONARY ARTERY BYPASS GRAFT  03/2003   X3. LIMA GRAFT TO THE LAD, SAPHENOUS VEIN GRAFT TO THE PA, AND A LEFT RADIAL GRAFT TO THEOBTUSE MARGINAL VESSEL  . ENDARTERECTOMY Left 05/28/2012   Procedure: ENDARTERECTOMY CAROTID;  Surgeon: Elam Dutch, MD;  Location: [facility];  Service: Vascular;  Laterality: Left;  . INSERT / REPLACE / REMOVE PACEMAKER  03/23/2017  . [place]; 1984  . MOLE SURGERY     "? face/arms"  . PACEMAKER IMPLANT N/A 03/23/2017   Procedure: PACEMAKER IMPLANT;  Surgeon: Constance Haw, MD;  Location: [facility];  Service: Cardiovascular;  Laterality: N/A;  . PATCH ANGIOPLASTY Left 05/28/2012   Procedure: WITH dACRON PATCH ANGIOPLASTY;  Surgeon: Elam Dutch, MD;  Location: [facility];  Service: Vascular;  Laterality: Left;  . RIGHT/LEFT HEART CATH AND CORONARY/GRAFT ANGIOGRAPHY N/A 03/23/2017   Procedure: RIGHT/LEFT HEART CATH AND CORONARY/GRAFT ANGIOGRAPHY;  Surgeon: Sherren Mocha, MD;  Location: [facility];  Service: Cardiovascular;  Laterality: N/A;  . TEE WITHOUT CARDIOVERSION N/A 05/09/2017   Procedure: TRANSESOPHAGEAL ECHOCARDIOGRAM (TEE);  Surgeon: Sherren Mocha, MD;  Location: [facility];  Service: Open Heart Surgery;  Laterality: N/A;  . TRANSCATHETER AORTIC VALVE REPLACEMENT, TRANSFEMORAL N/A 05/09/2017   Procedure: TRANSCATHETER AORTIC VALVE REPLACEMENT, TRANSFEMORAL using an Edwards 73mm Sapien 3 Aortic Valve;  Surgeon: Sherren Mocha, MD;  Location: [facility];  Service: Open Heart Surgery;  Laterality: N/A;    Current Medications: Outpatient Medications Prior to Visit  Medication Sig Dispense Refill  . acetaminophen (TYLENOL) 650 MG CR tablet Take 1,300 mg by mouth every 8 (eight) hours as needed for pain.    Marland Kitchen amLODipine (NORVASC) 5 MG  tablet Take 1 tablet (5 mg total) by mouth daily. 90 tablet 3  . apixaban (ELIQUIS) 5 MG TABS tablet Take 1 tablet (5 mg total) by mouth 2 (two) times daily. 28 tablet 0  . aspirin EC 81 MG EC tablet Take 1 tablet (81 mg total) by mouth daily.    . benazepril (LOTENSIN) 40 MG tablet Take 1 tablet (40 mg total) by mouth daily. 90 tablet 3  . Camphor-Menthol-Methyl Sal (MUSCLE RUB ULTRA STRENGTH) 05-03-28 % CREA Apply 1 application topically 3 (three) times daily as needed (muscle pain).    . Cyanocobalamin (RA VITAMIN B-12 TR) 1000 MCG TBCR Take 1,000 mcg by mouth daily.     Marland Kitchen ezetimibe (ZETIA) 10 MG tablet Take 1 tablet (10 mg total) by mouth daily. (Patient taking differently:  Take 10 mg by mouth every evening. ) 30 tablet 11  . fluticasone (FLONASE) 50 MCG/ACT nasal spray Place 2 sprays into both nostrils daily.    Marland Kitchen glimepiride (AMARYL) 2 MG tablet Take 2 mg by mouth daily with breakfast.    . guaiFENesin-codeine 100-10 MG/5ML syrup Take 5 mLs by mouth every 6 (six) hours as needed for cough.     . hydrochlorothiazide (HYDRODIURIL) 25 MG tablet Take 1 tablet (25 mg total) by mouth every morning. 90 tablet 1  . OVER THE COUNTER MEDICATION Replenex tablets: Take 3 tablet by mouth every morning (joint health)    . Polyvinyl Alcohol-Povidone (REFRESH OP) Place 2 drops into both eyes daily as needed (for dry eyes).     . rosuvastatin (CRESTOR) 20 MG tablet Take 1 tablet (20 mg total) by mouth daily. (Patient taking differently: Take 20 mg by mouth every evening. ) 30 tablet 0  . tamsulosin (FLOMAX) 0.4 MG CAPS capsule TAKE 1 CAPSULE BY MOUTH ONCE DAILY (Patient taking differently: TAKE 0.4 MG BY MOUTH ONCE DAILY IN THE EVENING) 90 capsule 3   No facility-administered medications prior to visit.      Allergies:   Niacin and related; Metformin and related; and Statins   Social History   Socioeconomic History  . Marital status: Married    Spouse name: Not on file  . Number of children: 5  .  Years of education: Not on file  . Highest education level: Not on file  Occupational History  . Occupation: retired    Fish farm manager: RETIRED  Social Needs  . Financial resource strain: Not on file  . Food insecurity:    Worry: Not on file    Inability: Not on file  . Transportation needs:    Medical: Not on file    Non-medical: Not on file  Tobacco Use  . Smoking status: Former Smoker    Packs/day: 2.00    Years: 39.00    Pack years: 78.00    Types: Cigarettes    Last attempt to quit: 01/24/1994    Years since quitting: 23.4  . Smokeless tobacco: Never Used  Substance and Sexual Activity  . Alcohol use: No  . Drug use: No  . Sexual activity: Never  Lifestyle  . Physical activity:    Days per week: Not on file    Minutes per session: Not on file  . Stress: Not on file  Relationships  . Social connections:    Talks on phone: Not on file    Gets together: Not on file    Attends religious service: Not on file    Active member of club or organization: Not on file    Attends meetings of clubs or organizations: Not on file    Relationship status: Not on file  Other Topics Concern  . Not on file  Social History Narrative  . Not on file     Family History:  The patient's family history includes Arrhythmia in his brother; Cancer in his brother; Heart attack in his brother and father; Parkinsonism in his brother.      ROS:   Please see the history of present illness.    ROS All other systems reviewed and are negative.   PHYSICAL EXAM:   VS:  BP (!) 148/74   Pulse 63   Ht 6' (1.829 m)   Wt 261 lb (118.4 kg)   BMI 35.40 kg/m    GEN: Well nourished, well developed, in no acute distress  HEENT: normal  Neck: no JVD, carotid bruits, or masses Cardiac: irreg irreg; no murmurs, rubs, or gallops,no edema  Respiratory:  clear to auscultation bilaterally, normal work of breathing GI: soft, nontender, nondistended, + BS MS: no deformity or atrophy  Skin: warm and dry, no  rash Neuro:  Alert and Oriented x 3, Strength and sensation are intact Psych: euthymic mood, full affect   Wt Readings from Last 3 Encounters:  06/14/17 261 lb (118.4 kg)  05/29/17 259 lb (117.5 kg)  05/19/17 263 lb 4.8 oz (119.4 kg)      Studies/Labs Reviewed:   EKG:  EKG is NOT ordered today.   Recent Labs: 02/07/2017: TSH 0.855 05/05/2017: ALT 20; B Natriuretic Peptide 280.0 05/10/2017: BUN 18; Creatinine, Ser 1.14; Hemoglobin 10.8; Magnesium 2.0; Platelets 188; Potassium 4.8; Sodium 137   Lipid Panel    Component Value Date/Time   CHOL 116 02/07/2017 1516   TRIG 81 02/07/2017 1516   HDL 48 02/07/2017 1516   CHOLHDL 2.3 12/11/2015 0854   VLDL 12 12/11/2015 0854   LDLCALC 52 02/07/2017 1516    Additional studies/ records that were reviewed today include:  TAVR OPERATIVE NOTE   Date of Procedure:                05/09/2017  Preoperative Diagnosis:      Severe Aortic Stenosis   Postoperative Diagnosis:    Same   Procedure:        Transcatheter Aortic Valve Replacement - Percutaneous Right Transfemoral Approach             Edwards Sapien 3 THV (size 26 mm, model # 9600TFX, serial # [id number])              Co-Surgeons:                        Gaye Pollack, MD and Sherren Mocha, MD  Pre-operative Echo Findings: ? Severe aortic stenosis ? Mild left ventricular systolic  dysfunction  Post-operative Echo Findings: ? Trace paravalvular leak ? Mild left ventricular systolic dysfunction  ________________  Post operative echo 05/10/17 Study Conclusions - Left ventricle: The cavity size was normal. Systolic function was   normal. The estimated ejection fraction was in the range of 60%   to 65%. Wall motion was normal; there were no regional wall   motion abnormalities. - Ventricular septum: The contour showed diastolic flattening. - Aortic valve: A TAVR bioprosthesis was present. There was no   regurgitation. Peak velocity (S): 207 cm/s. Mean gradient (S):  7   mm Hg. Valve area (VTI): 1.65 cm^2. Valve area (Vmax): 1.44 cm^2.   Valve area (Vmean): 1.77 cm^2. - Mitral valve: Moderately calcified annulus. - Pulmonary arteries: Systolic pressure was moderately to severely   increased. PA peak pressure: 65 mm Hg (S).  ________________  2D ECHO 06/14/17 (30 days post op) Study Conclusions - Left ventricle: The cavity size was normal. Wall thickness was   increased in a pattern of moderate LVH. Indeterminant diastolic   function. The estimated ejection fraction was 55%. Although no   diagnostic regional wall motion abnormality was identified, this   possibility cannot be completely excluded on the basis of this   study. - Aortic valve: Bioprosthetic aortic valve s/p TAVR. There was no   stenosis. There was no regurgitation. Mean gradient (S): 12 mm   Hg. - Mitral valve: Moderately calcified annulus. - Left atrium: The atrium was severely dilated. - Right ventricle:  The cavity size was mildly dilated. Pacer wire   or catheter noted in right ventricle. Systolic function was   normal. - Right atrium: The atrium was mildly dilated. - Tricuspid valve: Peak RV-RA gradient (S): 36 mm Hg. - Pulmonary arteries: PA peak pressure: 39 mm Hg (S). - Inferior vena cava: The vessel was normal in size. The   respirophasic diameter changes were in the normal range (>= 50%),   consistent with normal central venous pressure Impressions: - Normal LV size with moderate LV hypertrophy. EF 55%. Mildly   dilated RV with normal systolic function. Bioprosthetic aortic   valve s/p TAVR, no significant stenosis or regurgitation. Mild   pulmonary hypertension.   ASSESSMENT & PLAN:   Severe AS s/p TAVR: 2D ECHO today shows EF 55% with a normally functioning TAVR valve, mean gradient 12 mmHg and no PVL.  He has NHYA class II symptoms. He is working with cardiac rehab to improve stamina. Continue ASA and Eliquis. He can stop ASA after 6 months of therapy. SBE  prophylaxis discussed. He has full dentures and does not get routine dental work. I will see him back in 1 year with an echo.   HTN: BP elevated but 138/60 on my personal recheck. No changes made.  CAD/PAD: continue medical therapy.   Permanent atrial fibrillation: rate controlled. Continue Eliquis   Bradycardia s/p PPM: he has follow up with Dr. Curt Bears next week.  Medication Adjustments/Labs and Tests Ordered: Current medicines are reviewed at length with the patient today.  Concerns regarding medicines are outlined above.  Medication changes, Labs and Tests ordered today are listed in the Patient Instructions below. Patient Instructions  Medication Instructions:  You may STOP ASPIRIN November 09, 2017.  Labwork: None  Testing/Procedures: Your provider has requested that you have an echocardiogram in one year. You will be called to arrange this appointment.  Follow-Up: Please keep all your follow-up appointments.   Your provider recommends that you schedule a follow-up appointment in 1 YEAR with Nell Range, PA. You will be called to arrange this appointment.  Any Other Special Instructions Will Be Listed Below (If Applicable).     If you need a refill on your cardiac medications before your next appointment, please call your pharmacy.      Signed, Angelena Form, PA-C  06/14/2017 3:23 PM    [facility] [address], [place], [place]  [address] Phone: [phone]; Fax: [phone]

## 2017-06-13 NOTE — Progress Notes (Signed)
Daily Session Note  Patient Details  Name: Evan [name] MRN: [id number] Date of Birth: 02-15-1940 Referring Provider:     Cardiac Rehab from 05/29/2017 in [facility]  Referring Provider  [name], [name] MD      Encounter Date: 06/13/2017  Check In: Session Check In - 06/13/17 0829      Check-In   Location  [facility]    Staff Present  Heath Lark, RN, BSN, CCRP;Jessica Luan Pulling, MA, RCEP, CCRP, Exercise Physiologist;Normon Pettijohn Oletta Darter, [place], ACSM CEP, Exercise Physiologist    Supervising physician immediately available to respond to emergencies  See telemetry face sheet for immediately available ER MD    Medication changes reported      No    Fall or balance concerns reported     No    Warm-up and Cool-down  Performed on first and last piece of equipment    Resistance Training Performed  Yes    VAD Patient?  No      Pain Assessment   Currently in Pain?  No/denies    Multiple Pain Sites  No          Social History   Tobacco Use  Smoking Status Former Smoker  . Packs/day: 2.00  . Years: 39.00  . Pack years: 78.00  . Types: Cigarettes  . Last attempt to quit: 01/24/1994  . Years since quitting: 23.4  Smokeless Tobacco Never Used    Goals Met:  [place] with exercise equipment Exercise tolerated well No report of cardiac concerns or symptoms Strength training completed today  Goals Unmet:  Not Applicable  Comments: Pt able to follow exercise prescription today without complaint.  Will continue to monitor for progression.    Dr. Emily Filbert is Medical Director for [facility] and [facility].

## 2017-06-14 ENCOUNTER — Ambulatory Visit (INDEPENDENT_AMBULATORY_CARE_PROVIDER_SITE_OTHER): Payer: Medicare Other | Admitting: Physician Assistant

## 2017-06-14 ENCOUNTER — Encounter: Payer: Self-pay | Admitting: *Deleted

## 2017-06-14 ENCOUNTER — Ambulatory Visit (HOSPITAL_COMMUNITY): Payer: Medicare Other | Attending: Cardiology

## 2017-06-14 ENCOUNTER — Other Ambulatory Visit: Payer: Self-pay

## 2017-06-14 ENCOUNTER — Encounter: Payer: Self-pay | Admitting: Physician Assistant

## 2017-06-14 VITALS — BP 148/74 | HR 63 | Ht 72.0 in | Wt 261.0 lb

## 2017-06-14 DIAGNOSIS — Z8673 Personal history of transient ischemic attack (TIA), and cerebral infarction without residual deficits: Secondary | ICD-10-CM | POA: Diagnosis not present

## 2017-06-14 DIAGNOSIS — I4891 Unspecified atrial fibrillation: Secondary | ICD-10-CM | POA: Insufficient documentation

## 2017-06-14 DIAGNOSIS — Z951 Presence of aortocoronary bypass graft: Secondary | ICD-10-CM | POA: Insufficient documentation

## 2017-06-14 DIAGNOSIS — I25810 Atherosclerosis of coronary artery bypass graft(s) without angina pectoris: Secondary | ICD-10-CM

## 2017-06-14 DIAGNOSIS — I2510 Atherosclerotic heart disease of native coronary artery without angina pectoris: Secondary | ICD-10-CM | POA: Insufficient documentation

## 2017-06-14 DIAGNOSIS — I6523 Occlusion and stenosis of bilateral carotid arteries: Secondary | ICD-10-CM

## 2017-06-14 DIAGNOSIS — Z950 Presence of cardiac pacemaker: Secondary | ICD-10-CM

## 2017-06-14 DIAGNOSIS — I350 Nonrheumatic aortic (valve) stenosis: Secondary | ICD-10-CM

## 2017-06-14 DIAGNOSIS — I509 Heart failure, unspecified: Secondary | ICD-10-CM | POA: Diagnosis not present

## 2017-06-14 DIAGNOSIS — I110 Hypertensive heart disease with heart failure: Secondary | ICD-10-CM | POA: Insufficient documentation

## 2017-06-14 DIAGNOSIS — I059 Rheumatic mitral valve disease, unspecified: Secondary | ICD-10-CM | POA: Insufficient documentation

## 2017-06-14 DIAGNOSIS — E785 Hyperlipidemia, unspecified: Secondary | ICD-10-CM | POA: Diagnosis not present

## 2017-06-14 DIAGNOSIS — I272 Pulmonary hypertension, unspecified: Secondary | ICD-10-CM | POA: Insufficient documentation

## 2017-06-14 DIAGNOSIS — I482 Chronic atrial fibrillation: Secondary | ICD-10-CM | POA: Diagnosis not present

## 2017-06-14 DIAGNOSIS — Z953 Presence of xenogenic heart valve: Secondary | ICD-10-CM | POA: Diagnosis not present

## 2017-06-14 DIAGNOSIS — Z952 Presence of prosthetic heart valve: Secondary | ICD-10-CM

## 2017-06-14 DIAGNOSIS — I10 Essential (primary) hypertension: Secondary | ICD-10-CM | POA: Diagnosis not present

## 2017-06-14 DIAGNOSIS — E119 Type 2 diabetes mellitus without complications: Secondary | ICD-10-CM | POA: Diagnosis not present

## 2017-06-14 DIAGNOSIS — I4821 Permanent atrial fibrillation: Secondary | ICD-10-CM

## 2017-06-14 NOTE — Patient Instructions (Signed)
Medication Instructions:  You may STOP ASPIRIN November 09, 2017.  Labwork: None  Testing/Procedures: Your provider has requested that you have an echocardiogram in one year. You will be called to arrange this appointment.  Follow-Up: Please keep all your follow-up appointments.   Your provider recommends that you schedule a follow-up appointment in 1 YEAR with Nell Range, PA. You will be called to arrange this appointment.  Any Other Special Instructions Will Be Listed Below (If Applicable).     If you need a refill on your cardiac medications before your next appointment, please call your pharmacy.

## 2017-06-14 NOTE — Progress Notes (Signed)
Cardiac Individual Treatment Plan  Patient Details  Name: Evan [name] MRN: [id number] Date of Birth: 03-23-1940 Referring Provider:     Cardiac Rehab from 05/29/2017 in [facility] Cardiac and Pulmonary Rehab  Referring Provider  [name], [name] MD      Initial Encounter Date:    Cardiac Rehab from 05/29/2017 in [facility] Cardiac and Pulmonary Rehab  Date  05/29/17  Referring Provider  [name], [name] MD      Visit Diagnosis: S/P TAVR (transcatheter aortic valve replacement)  Patient's Home Medications on Admission:  Current Outpatient Medications:  .  acetaminophen (TYLENOL) 650 MG CR tablet, Take 1,300 mg by mouth every 8 (eight) hours as needed for pain., Disp: , Rfl:  .  amLODipine (NORVASC) 5 MG tablet, Take 1 tablet (5 mg total) by mouth daily., Disp: 90 tablet, Rfl: 3 .  apixaban (ELIQUIS) 5 MG TABS tablet, Take 1 tablet (5 mg total) by mouth 2 (two) times daily., Disp: 28 tablet, Rfl: 0 .  aspirin EC 81 MG EC tablet, Take 1 tablet (81 mg total) by mouth daily., Disp: , Rfl:  .  benazepril (LOTENSIN) 40 MG tablet, Take 1 tablet (40 mg total) by mouth daily., Disp: 90 tablet, Rfl: 3 .  Camphor-Menthol-Methyl Sal (MUSCLE RUB ULTRA STRENGTH) 05-03-28 % CREA, Apply 1 application topically 3 (three) times daily as needed (muscle pain)., Disp: , Rfl:  .  Cyanocobalamin (RA VITAMIN B-12 TR) 1000 MCG TBCR, Take 1,000 mcg by mouth daily. , Disp: , Rfl:  .  ezetimibe (ZETIA) 10 MG tablet, Take 1 tablet (10 mg total) by mouth daily. (Patient taking differently: Take 10 mg by mouth every evening. ), Disp: 30 tablet, Rfl: 11 .  fluticasone (FLONASE) 50 MCG/ACT nasal spray, Place 2 sprays into both nostrils daily., Disp: , Rfl:  .  glimepiride (AMARYL) 2 MG tablet, Take 2 mg by mouth daily with breakfast., Disp: , Rfl:  .  guaiFENesin-codeine 100-10 MG/5ML syrup, Take by mouth., Disp: , Rfl:  .  hydrochlorothiazide (HYDRODIURIL) 25 MG tablet, Take 1 tablet (25 mg total) by mouth every morning., Disp: 90  tablet, Rfl: 1 .  OVER THE COUNTER MEDICATION, Replenex tablets: Take 3 tablet by mouth every morning (joint health), Disp: , Rfl:  .  Polyvinyl Alcohol-Povidone (REFRESH OP), Place 2 drops into both eyes daily as needed (for dry eyes). , Disp: , Rfl:  .  rosuvastatin (CRESTOR) 20 MG tablet, Take 1 tablet (20 mg total) by mouth daily. (Patient taking differently: Take 20 mg by mouth every evening. ), Disp: 30 tablet, Rfl: 0 .  tamsulosin (FLOMAX) 0.4 MG CAPS capsule, TAKE 1 CAPSULE BY MOUTH ONCE DAILY (Patient taking differently: TAKE 0.4 MG BY MOUTH ONCE DAILY IN THE EVENING), Disp: 90 capsule, Rfl: 3  Past Medical History: Past Medical History:  Diagnosis Date  . Anemia   . Carotid arterial disease ([place])   . CHF (congestive heart failure) ([place])   . Coronary artery disease   . HOH (hard of hearing)   . Hyperlipidemia   . Hypertension   . Nocturia   . Obesity   . Osteoarthritis    "BUE; BLE; right knee" (03/23/2017)  . Peripheral vision loss, bilateral    S/P 07/2015  . Permanent atrial fibrillation ([place])   . 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 Severe aortic stenosis   . Sinus drainage   . Stroke [facility]) 07/2015   "peripheral vision still bad out of left eye" (03/23/2017)  . Torn rotator cuff  2012   Right arm  . Type II diabetes mellitus ([place])   . Urinary frequency     Tobacco Use: Social History   Tobacco Use  Smoking Status Former Smoker  . Packs/day: 2.00  . Years: 39.00  . Pack years: 78.00  . Types: Cigarettes  . Last attempt to quit: 01/24/1994  . Years since quitting: 23.4  Smokeless Tobacco Never Used    Labs: Recent Chemical engineer    Labs for ITP Cardiac and Pulmonary Rehab Latest Ref Rng & Units 05/05/2017 05/09/2017 05/09/2017 05/09/2017 05/09/2017   Cholestrol 100 - 199 mg/dL - - - - -   LDLCALC 0 - 99 mg/dL - - - - -   HDL >39 mg/dL - - - - -   Trlycerides 0 - 149 mg/dL - - - - -   Hemoglobin A1c 4.8 - 5.6 % 6.8(H) - - - -   PHART  7.350 - 7.450 7.445 - 7.310(L) - 7.337(L)   PCO2ART 32.0 - 48.0 mmHg 34.1 - 52.0(H) - 46.0   HCO3 20.0 - 28.0 mmol/L 23.1 - 26.1 - 24.7   TCO2 22 - 32 mmol/L - '27 28 26 26   ' ACIDBASEDEF 0.0 - 2.0 mmol/L 0.5 - 1.0 - 1.0   O2SAT % 98.3 - 99.0 - 99.0       Exercise Target Goals:    Exercise Program Goal: Individual exercise prescription set using results from initial 6 min walk test and THRR while considering  patient's activity barriers and safety.   Exercise Prescription Goal: Initial exercise prescription builds to 30-45 minutes a day of aerobic activity, 2-3 days per week.  Home exercise guidelines will be given to patient during program as part of exercise prescription that the participant will acknowledge.  Activity Barriers & Risk Stratification: Activity Barriers & Cardiac Risk Stratification - 05/29/17 1416      Activity Barriers & Cardiac Risk Stratification   Activity Barriers  Joint Problems;Muscular Weakness;Shortness of Breath;Other (comment);Deconditioning;Back Problems;Arthritis;Balance Concerns    Comments  both arms- issues lifting and griping , arthritis in both knees (left side a little worse)    Cardiac Risk Stratification  Moderate       6 Minute Walk: 6 Minute Walk    Row Name 05/29/17 1431         6 Minute Walk   Phase  Initial     Distance  980 feet     Walk Time  6 minutes     # of Rest Breaks  0     MPH  1.86     METS  1.8     RPE  13     Perceived Dyspnea   3     VO2 Peak  6.32     Symptoms  Yes (comment)     Comments  hip/leg pain 5/10, SOB     Resting HR  64 bpm     Resting BP  128/62     Resting Oxygen Saturation   97 %     Exercise Oxygen Saturation  during 6 min walk  96 %     Max Ex. HR  89 bpm     Max Ex. BP  148/74     2 Minute Post BP  132/60        Oxygen Initial Assessment:   Oxygen Re-Evaluation:   Oxygen Discharge (Final Oxygen Re-Evaluation):   Initial Exercise Prescription: Initial Exercise Prescription -  05/29/17 1400  Date of Initial Exercise RX and Referring Provider   Date  05/29/17    Referring Provider  [name], [name] MD      Treadmill   MPH  1.1    Grade  0    Minutes  15    METs  1.84      Recumbant Bike   Level  1    RPM  40    Minutes  15    METs  1.8      T5 Nustep   Level  1    SPM  80    Minutes  15    METs  1.8      Prescription Details   Frequency (times per week)  2    Duration  Progress to 30 minutes of continuous aerobic without signs/symptoms of physical distress      Intensity   THRR 40-80% of Max Heartrate  98-131    Ratings of Perceived Exertion  11-13    Perceived Dyspnea  0-4      Progression   Progression  Continue to progress workloads to maintain intensity without signs/symptoms of physical distress.      Resistance Training   Training Prescription  Yes    Weight  3 lbs    Reps  10-15       Perform Capillary Blood Glucose checks as needed.  Exercise Prescription Changes: Exercise Prescription Changes    Row Name 05/29/17 1400 06/06/17 1200           Response to Exercise   Blood Pressure (Admit)  128/62  128/78      Blood Pressure (Exercise)  148/74  168/80      Blood Pressure (Exit)  132/60  124/70      Heart Rate (Admit)  64 bpm  75 bpm      Heart Rate (Exercise)  89 bpm  96 bpm      Heart Rate (Exit)  53 bpm  67 bpm      Oxygen Saturation (Admit)  97 %  -      Oxygen Saturation (Exercise)  96 %  -      Rating of Perceived Exertion (Exercise)  13  15      Perceived Dyspnea (Exercise)  3  -      Symptoms  hip/leg pain 5/10, SOB  none      Comments  walk test results  second full day of exercise      Duration  -  Continue with 30 min of aerobic exercise without signs/symptoms of physical distress.      Intensity  -  THRR unchanged        Progression   Progression  -  Continue to progress workloads to maintain intensity without signs/symptoms of physical distress.      Average METs  -  1.82        Resistance Training    Training Prescription  -  Yes      Weight  -  3 lbs      Reps  -  10-15        Interval Training   Interval Training  -  No        Treadmill   MPH  -  1.1      Grade  -  0      Minutes  -  15      METs  -  1.84        T5 Nustep  Level  -  1      Minutes  -  15      METs  -  1.8         Exercise Comments: Exercise Comments    Row Name 06/01/17 0821           Exercise Comments  First full day of exercise!  Patient was oriented to gym and equipment including functions, settings, policies, and procedures.  Patient's individual exercise prescription and treatment plan were reviewed.  All starting workloads were established based on the results of the 6 minute walk test done at initial orientation visit.  The plan for exercise progression was also introduced and progression will be customized based on patient's performance and goals.          Exercise Goals and Review: Exercise Goals    Row Name 05/29/17 1443             Exercise Goals   Increase Physical Activity  Yes       Intervention  Provide advice, education, support and counseling about physical activity/exercise needs.;Develop an individualized exercise prescription for aerobic and resistive training based on initial evaluation findings, risk stratification, comorbidities and participant's personal goals.       Expected Outcomes  Short Term: Attend rehab on a regular basis to increase amount of physical activity.;Long Term: Exercising regularly at least 3-5 days a week.;Long Term: Add in home exercise to make exercise part of routine and to increase amount of physical activity.       Increase Strength and Stamina  Yes       Intervention  Provide advice, education, support and counseling about physical activity/exercise needs.;Develop an individualized exercise prescription for aerobic and resistive training based on initial evaluation findings, risk stratification, comorbidities and participant's personal goals.        Expected Outcomes  Short Term: Increase workloads from initial exercise prescription for resistance, speed, and METs.;Short Term: Perform resistance training exercises routinely during rehab and add in resistance training at home;Long Term: Improve cardiorespiratory fitness, muscular endurance and strength as measured by increased METs and functional capacity (6MWT)       Able to understand and use rate of perceived exertion (RPE) scale  Yes       Intervention  Provide education and explanation on how to use RPE scale       Expected Outcomes  Short Term: Able to use RPE daily in rehab to express subjective intensity level;Long Term:  Able to use RPE to guide intensity level when exercising independently       Knowledge and understanding of Target Heart Rate Range (THRR)  Yes       Intervention  Provide education and explanation of THRR including how the numbers were predicted and where they are located for reference       Expected Outcomes  Short Term: Able to state/look up THRR;Short Term: Able to use daily as guideline for intensity in rehab;Long Term: Able to use THRR to govern intensity when exercising independently       Able to check pulse independently  Yes       Intervention  Provide education and demonstration on how to check pulse in carotid and radial arteries.;Review the importance of being able to check your own pulse for safety during independent exercise       Expected Outcomes  Short Term: Able to explain why pulse checking is important during independent exercise;Long Term: Able to check pulse independently and accurately  Understanding of Exercise Prescription  Yes       Intervention  Provide education, explanation, and written materials on patient's individual exercise prescription       Expected Outcomes  Short Term: Able to explain program exercise prescription;Long Term: Able to explain home exercise prescription to exercise independently          Exercise Goals  Re-Evaluation : Exercise Goals Re-Evaluation    Row Name 06/01/17 6568 06/06/17 1134           Exercise Goal Re-Evaluation   Exercise Goals Review  Knowledge and understanding of Target Heart Rate Range (THRR);Able to understand and use rate of perceived exertion (RPE) scale;Understanding of Exercise Prescription;Increase Physical Activity  Increase Physical Activity;Increase Strength and Stamina;Understanding of Exercise Prescription      Comments  Reviewed RPE scale, THR and program prescription with pt today.  Pt voiced understanding and was given a copy of goals to take home.   Dsean is off to a good start in rehab. He has completed two full days of exercise.  We will continue to monitor his progression.       Expected Outcomes  Short: Use RPE daily to regulate intensity.  Long: Follow program prescription in THR.  Short: Continue to attend regularly.  Long: Continue to follow program prescription.          Discharge Exercise Prescription (Final Exercise Prescription Changes): Exercise Prescription Changes - 06/06/17 1200      Response to Exercise   Blood Pressure (Admit)  128/78    Blood Pressure (Exercise)  168/80    Blood Pressure (Exit)  124/70    Heart Rate (Admit)  75 bpm    Heart Rate (Exercise)  96 bpm    Heart Rate (Exit)  67 bpm    Rating of Perceived Exertion (Exercise)  15    Symptoms  none    Comments  second full day of exercise    Duration  Continue with 30 min of aerobic exercise without signs/symptoms of physical distress.    Intensity  THRR unchanged      Progression   Progression  Continue to progress workloads to maintain intensity without signs/symptoms of physical distress.    Average METs  1.82      Resistance Training   Training Prescription  Yes    Weight  3 lbs    Reps  10-15      Interval Training   Interval Training  No      Treadmill   MPH  1.1    Grade  0    Minutes  15    METs  1.84      T5 Nustep   Level  1    Minutes  15    METs   1.8       Nutrition:  Target Goals: Understanding of nutrition guidelines, daily intake of sodium <1530m, cholesterol <2072m calories 30% from fat and 7% or less from saturated fats, daily to have 5 or more servings of fruits and vegetables.  Biometrics: Pre Biometrics - 05/29/17 1514      Pre Biometrics   Height  6' (1.829 m)    Weight  259 lb (117.5 kg)    Waist Circumference  45.25 inches    Hip Circumference  46 inches    Waist to Hip Ratio  0.98 %    BMI (Calculated)  35.12    Single Leg Stand  2.51 seconds        Nutrition  Therapy Plan and Nutrition Goals: Nutrition Therapy & Goals - 05/29/17 1406      Intervention Plan   Intervention  Prescribe, educate and counsel regarding individualized specific dietary modifications aiming towards targeted core components such as weight, hypertension, lipid management, diabetes, heart failure and other comorbidities.;Nutrition handout(s) given to patient.    Expected Outcomes  Short Term Goal: Understand basic principles of dietary content, such as calories, fat, sodium, cholesterol and nutrients.;Short Term Goal: A plan has been developed with personal nutrition goals set during dietitian appointment.;Long Term Goal: Adherence to prescribed nutrition plan.       Nutrition Assessments: Nutrition Assessments - 05/29/17 1406      MEDFICTS Scores   Pre Score  91       Nutrition Goals Re-Evaluation:   Nutrition Goals Discharge (Final Nutrition Goals Re-Evaluation):   Psychosocial: Target Goals: Acknowledge presence or absence of significant depression and/or stress, maximize coping skills, provide positive support system. Participant is able to verbalize types and ability to use techniques and skills needed for reducing stress and depression.   Initial Review & Psychosocial Screening: Initial Psych Review & Screening - 05/29/17 1406      Initial Review   Current issues with  Current Sleep Concerns;Current Stress Concerns     Source of Stress Concerns  Unable to perform yard/household activities;Unable to participate in former interests or hobbies      [place]?  Yes spouse      Barriers   Psychosocial barriers to participate in program  There are no identifiable barriers or psychosocial needs.;The patient should benefit from training in stress management and relaxation.      Screening Interventions   Interventions  Encouraged to exercise;Provide feedback about the scores to participant;Program counselor consult;To provide support and resources with identified psychosocial needs    Expected Outcomes  Short Term goal: Utilizing psychosocial counselor, staff and physician to assist with identification of specific Stressors or current issues interfering with healing process. Setting desired goal for each stressor or current issue identified.;Long Term Goal: Stressors or current issues are controlled or eliminated.;Short Term goal: Identification and review with participant of any Quality of Life or Depression concerns found by scoring the questionnaire.;Long Term goal: The participant improves quality of Life and PHQ9 Scores as seen by post scores and/or verbalization of changes       Quality of Life Scores:  Quality of Life - 05/29/17 1042      Quality of Life Scores   Health/Function Pre  22.65 %    Socioeconomic Pre  27.83 %    Psych/Spiritual Pre  30 %    Family Pre  28.5 %    GLOBAL Pre  26.18 %      Scores of 19 and below usually indicate a poorer quality of life in these areas.  A difference of  2-3 points is a clinically meaningful difference.  A difference of 2-3 points in the total score of the Quality of Life Index has been associated with significant improvement in overall quality of life, self-image, physical symptoms, and general health in studies assessing change in quality of life.  PHQ-9: Recent Review Flowsheet Data    Depression screen [facility] 2/9 05/29/2017    Decreased Interest 1   Down, Depressed, Hopeless 0   PHQ - 2 Score 1   Altered sleeping 2   Tired, decreased energy 1   Change in appetite 0   Feeling bad or failure about yourself  0   Trouble concentrating 2   Moving slowly or fidgety/restless 0   Suicidal thoughts 0   PHQ-9 Score 6   Difficult doing work/chores Somewhat difficult     Interpretation of Total Score  Total Score Depression Severity:  1-4 = Minimal depression, 5-9 = Mild depression, 10-14 = Moderate depression, 15-19 = Moderately severe depression, 20-27 = Severe depression   Psychosocial Evaluation and Intervention: Psychosocial Evaluation - 06/01/17 0915      Psychosocial Evaluation & Interventions   Interventions  Encouraged to exercise with the program and follow exercise prescription    Comments  Counselor met with Mr. Albus Pretlow) and his spouse Burman Nieves today for Jeremi's initial psychosocial evaluation.  He is an almost 77 year old who had a pacemaker put in in February and several weeks ago had a valve replaced.  He has a history of heart disease with a CABGx3 in 2005.  Tesean has a strong support system with a spouse of 53 years; (5) adult children and their families who all live locally; and he is actively involved in his local church.  He has a hearing problem so his spouse was very helpful in this assessment with communications.  Chia reports sleeping well mostly but he has some shoulder pain that interrupts his sleep occasionally.  He has a good appetite.  Gennie denies a history of depression or anxiety and states he is generally in a positive mood most of the time - his spouse agreed.  Nykeem has minimal stress in his life other than his health; which limits him from doing many things he enjoys.  He was noticeably sad at times today as he reported he got news that one of his good friends had died this morning.  Counselor provided support for Geran and his spouse.  Walker has goals to get back to his normal  activities and be stronger and feel better while in this program.  He was in a rehab program after his open heart surgery in 2005 and reports it was helpful and a positive experience.  Staff will follow with Rosaria Ferries throughout the course of this program.      Expected Outcomes  Short:  Osamah will exercise to feel stronger and return to some of his normal activities such as driving and yard work.   Long:  Daishaun will exercise consistently to increase his stamina overall.      Continue Psychosocial Services   Follow up required by staff       Psychosocial Re-Evaluation:   Psychosocial Discharge (Final Psychosocial Re-Evaluation):   Vocational Rehabilitation: Provide vocational rehab assistance to qualifying candidates.   Vocational Rehab Evaluation & Intervention: Vocational Rehab - 05/29/17 1415      Initial Vocational Rehab Evaluation & Intervention   Assessment shows need for Vocational Rehabilitation  No       Education: Education Goals: Education classes will be provided on a variety of topics geared toward better understanding of heart health and risk factor modification. Participant will state understanding/return demonstration of topics presented as noted by education test scores.  Learning Barriers/Preferences: Learning Barriers/Preferences - 05/29/17 1407      Learning Barriers/Preferences   Learning Barriers  Hearing;Sight    Learning Preferences  Individual Instruction;Skilled Demonstration;Video       Education Topics:  AED/CPR: - Group verbal and written instruction with the use of models to demonstrate the basic use of the AED with the basic ABC's of resuscitation.   General Nutrition Guidelines/Fats and Fiber: -Group  instruction provided by verbal, written material, models and posters to present the general guidelines for heart healthy nutrition. Gives an explanation and review of dietary fats and fiber.   Cardiac Rehab from 06/13/2017 in [facility] Cardiac and  Pulmonary Rehab  Date  06/13/17  Educator  PI  Instruction Review Code  1- Verbalizes Understanding      Controlling Sodium/Reading Food Labels: -Group verbal and written material supporting the discussion of sodium use in heart healthy nutrition. Review and explanation with models, verbal and written materials for utilization of the food label.   Exercise Physiology & General Exercise Guidelines: - Group verbal and written instruction with models to review the exercise physiology of the cardiovascular system and associated critical values. Provides general exercise guidelines with specific guidelines to those with heart or lung disease.    Aerobic Exercise & Resistance Training: - Gives group verbal and written instruction on the various components of exercise. Focuses on aerobic and resistive training programs and the benefits of this training and how to safely progress through these programs..   Flexibility, Balance, Mind/Body Relaxation: Provides group verbal/written instruction on the benefits of flexibility and balance training, including mind/body exercise modes such as yoga, pilates and tai chi.  Demonstration and skill practice provided.   Stress and Anxiety: - Provides group verbal and written instruction about the health risks of elevated stress and causes of high stress.  Discuss the correlation between heart/lung disease and anxiety and treatment options. Review healthy ways to manage with stress and anxiety.   Depression: - Provides group verbal and written instruction on the correlation between heart/lung disease and depressed mood, treatment options, and the stigmas associated with seeking treatment.   Anatomy & Physiology of the Heart: - Group verbal and written instruction and models provide basic cardiac anatomy and physiology, with the coronary electrical and arterial systems. Review of Valvular disease and Heart Failure   Cardiac Procedures: - Group verbal and  written instruction to review commonly prescribed medications for heart disease. Reviews the medication, class of the drug, and side effects. Includes the steps to properly store meds and maintain the prescription regimen. (beta blockers and nitrates)   Cardiac Rehab from 06/13/2017 in [facility] Cardiac and Pulmonary Rehab  Date  06/08/17  Educator  CE  Instruction Review Code  1- Verbalizes Understanding      Cardiac Medications I: - Group verbal and written instruction to review commonly prescribed medications for heart disease. Reviews the medication, class of the drug, and side effects. Includes the steps to properly store meds and maintain the prescription regimen.   Cardiac Medications II: -Group verbal and written instruction to review commonly prescribed medications for heart disease. Reviews the medication, class of the drug, and side effects. (all other drug classes)    Go Sex-Intimacy & Heart Disease, Get SMART - Goal Setting: - Group verbal and written instruction through game format to discuss heart disease and the return to sexual intimacy. Provides group verbal and written material to discuss and apply goal setting through the application of the S.M.A.R.T. Method.   Cardiac Rehab from 06/13/2017 in [facility] Cardiac and Pulmonary Rehab  Date  06/08/17  Educator  CE  Instruction Review Code  1- Verbalizes Understanding      Other Matters of the Heart: - Provides group verbal, written materials and models to describe Stable Angina and Peripheral Artery. Includes description of the disease process and treatment options available to the cardiac patient.   Exercise & Equipment Safety: - Individual  verbal instruction and demonstration of equipment use and safety with use of the equipment.   Cardiac Rehab from 06/13/2017 in [facility] Cardiac and Pulmonary Rehab  Date  05/29/17  Educator  [facility]  Instruction Review Code  1- Verbalizes Understanding      Infection Prevention: - Provides  verbal and written material to individual with discussion of infection control including proper hand washing and proper equipment cleaning during exercise session.   Cardiac Rehab from 06/13/2017 in [facility] Cardiac and Pulmonary Rehab  Date  05/29/17  Educator  [facility]  Instruction Review Code  1- Verbalizes Understanding      Falls Prevention: - Provides verbal and written material to individual with discussion of falls prevention and safety.   Cardiac Rehab from 06/13/2017 in [facility] Cardiac and Pulmonary Rehab  Date  05/29/17  Educator  [facility]  Instruction Review Code  1- Verbalizes Understanding      Diabetes: - Individual verbal and written instruction to review signs/symptoms of diabetes, desired ranges of glucose level fasting, after meals and with exercise. Acknowledge that pre and post exercise glucose checks will be done for 3 sessions at entry of program.   Cardiac Rehab from 06/13/2017 in [facility] Cardiac and Pulmonary Rehab  Date  05/29/17  Educator  [facility]  Instruction Review Code  1- Verbalizes Understanding      Know Your Numbers and Risk Factors: -Group verbal and written instruction about important numbers in your health.  Discussion of what are risk factors and how they play a role in the disease process.  Review of Cholesterol, Blood Pressure, Diabetes, and BMI and the role they play in your overall health.   Sleep Hygiene: -Provides group verbal and written instruction about how sleep can affect your health.  Define sleep hygiene, discuss sleep cycles and impact of sleep habits. Review good sleep hygiene tips.    Cardiac Rehab from 06/13/2017 in [facility] Cardiac and Pulmonary Rehab  Date  06/06/17  Educator  [facility]  Instruction Review Code  1- Verbalizes Understanding      Other: -Provides group and verbal instruction on various topics (see comments)   Knowledge Questionnaire Score: Knowledge Questionnaire Score - 05/29/17 1041      Knowledge Questionnaire Score   Pre Score  19/28  results and test reviewed with pt and spouse today       Core Components/Risk Factors/Patient Goals at Admission: Personal Goals and Risk Factors at Admission - 05/29/17 1402      Core Components/Risk Factors/Patient Goals on Admission    Weight Management  Yes;Weight Loss;Obesity    Intervention  Weight Management: Develop a combined nutrition and exercise program designed to reach desired caloric intake, while maintaining appropriate intake of nutrient and fiber, sodium and fats, and appropriate energy expenditure required for the weight goal.;Weight Management: Provide education and appropriate resources to help participant work on and attain dietary goals.;Weight Management/Obesity: Establish reasonable short term and long term weight goals.;Obesity: Provide education and appropriate resources to help participant work on and attain dietary goals.    Admit Weight  259 lb (117.5 kg)    Goal Weight: Short Term  254 lb (115.2 kg)    Goal Weight: Long Term  230 lb (104.3 kg)    Expected Outcomes  Short Term: Continue to assess and modify interventions until short term weight is achieved;Long Term: Adherence to nutrition and physical activity/exercise program aimed toward attainment of established weight goal;Weight Loss: Understanding of general recommendations for a balanced deficit meal plan, which promotes 1-2  lb weight loss per week and includes a negative energy balance of [id number] kcal/d;Understanding recommendations for meals to include 15-35% energy as protein, 25-35% energy from fat, 35-60% energy from carbohydrates, less than 263m of dietary cholesterol, 20-35 gm of total fiber daily;Understanding of distribution of calorie intake throughout the day with the consumption of 4-5 meals/snacks    Diabetes  Yes    Intervention  Provide education about signs/symptoms and action to take for hypo/hyperglycemia.;Provide education about proper nutrition, including hydration, and aerobic/resistive  exercise prescription along with prescribed medications to achieve blood glucose in normal ranges: Fasting glucose 65-99 mg/dL    Expected Outcomes  Short Term: Participant verbalizes understanding of the signs/symptoms and immediate care of hyper/hypoglycemia, proper foot care and importance of medication, aerobic/resistive exercise and nutrition plan for blood glucose control.;Long Term: Attainment of HbA1C < 7%.    Hypertension  Yes    Intervention  Provide education on lifestyle modifcations including regular physical activity/exercise, weight management, moderate sodium restriction and increased consumption of fresh fruit, vegetables, and low fat dairy, alcohol moderation, and smoking cessation.;Monitor prescription use compliance.    Expected Outcomes  Short Term: Continued assessment and intervention until BP is < 140/93mHG in hypertensive participants. < 130/801mG in hypertensive participants with diabetes, heart failure or chronic kidney disease.;Long Term: Maintenance of blood pressure at goal levels.    Lipids  Yes    Intervention  Provide education and support for participant on nutrition & aerobic/resistive exercise along with prescribed medications to achieve LDL <14m51mDL >40mg6m Expected Outcomes  Short Term: Participant states understanding of desired cholesterol values and is compliant with medications prescribed. Participant is following exercise prescription and nutrition guidelines.;Long Term: Cholesterol controlled with medications as prescribed, with individualized exercise RX and with personalized nutrition plan. Value goals: LDL < 14mg,27m > 40 mg.       Core Components/Risk Factors/Patient Goals Review:    Core Components/Risk Factors/Patient Goals at Discharge (Final Review):    ITP Comments: ITP Comments    Row Name 05/29/17 1358 06/14/17 0558         ITP Comments  Med Review completed. Initial ITP created. [facility]  30  day review. Continue with ITP unless directed changes per Medical Director   New to program         Comments:

## 2017-06-15 ENCOUNTER — Other Ambulatory Visit (HOSPITAL_COMMUNITY): Payer: Medicare Other

## 2017-06-15 ENCOUNTER — Ambulatory Visit: Payer: Medicare Other | Admitting: Physician Assistant

## 2017-06-15 ENCOUNTER — Encounter: Payer: Medicare Other | Admitting: *Deleted

## 2017-06-15 DIAGNOSIS — Z952 Presence of prosthetic heart valve: Secondary | ICD-10-CM | POA: Diagnosis not present

## 2017-06-15 NOTE — Progress Notes (Signed)
Daily Session Note  Patient Details  Name: Evan [name] MRN: [id number] Date of Birth: 12/06/1940 Referring Provider:     Cardiac Rehab from 05/29/2017 in [facility] Cardiac and Pulmonary Rehab  Referring Provider  [name], [name] MD      Encounter Date: 06/15/2017  Check In: Session Check In - 06/15/17 0829      Check-In   Location  [facility]-Cardiac & Pulmonary Rehab    Staff Present  Renita Papa, RN BSN;Sebert Stollings Luan Pulling, MA, RCEP, CCRP, Exercise Physiologist;Carroll Enterkin, RN, BSN    Supervising physician immediately available to respond to emergencies  See telemetry face sheet for immediately available ER MD    Medication changes reported      No    Fall or balance concerns reported     No    Warm-up and Cool-down  Performed on first and last piece of equipment    Resistance Training Performed  Yes    VAD Patient?  No      Pain Assessment   Currently in Pain?  No/denies          Social History   Tobacco Use  Smoking Status Former Smoker  . Packs/day: 2.00  . Years: 39.00  . Pack years: 78.00  . Types: Cigarettes  . Last attempt to quit: 01/24/1994  . Years since quitting: 23.4  Smokeless Tobacco Never Used    Goals Met:  Independence with exercise equipment Exercise tolerated well No report of cardiac concerns or symptoms Strength training completed today  Goals Unmet:  Not Applicable  Comments: Pt able to follow exercise prescription today without complaint.  Will continue to monitor for progression. Reviewed home exercise with pt today.  [place]  Reviewed THR, pulse, RPE, sign and symptoms, NTG use, and when to call 911 or MD.  Also discussed weather considerations and indoor options.  Pt voiced understanding.   Dr. Emily Filbert is Medical Director for [place] and [facility].

## 2017-06-20 ENCOUNTER — Ambulatory Visit (INDEPENDENT_AMBULATORY_CARE_PROVIDER_SITE_OTHER): Payer: Medicare Other | Admitting: Cardiology

## 2017-06-20 ENCOUNTER — Encounter: Payer: Self-pay | Admitting: Cardiology

## 2017-06-20 VITALS — BP 130/64 | HR 63 | Ht 72.0 in | Wt 265.0 lb

## 2017-06-20 DIAGNOSIS — I10 Essential (primary) hypertension: Secondary | ICD-10-CM

## 2017-06-20 DIAGNOSIS — I4821 Permanent atrial fibrillation: Secondary | ICD-10-CM

## 2017-06-20 DIAGNOSIS — I6523 Occlusion and stenosis of bilateral carotid arteries: Secondary | ICD-10-CM

## 2017-06-20 DIAGNOSIS — I25810 Atherosclerosis of coronary artery bypass graft(s) without angina pectoris: Secondary | ICD-10-CM

## 2017-06-20 DIAGNOSIS — Z952 Presence of prosthetic heart valve: Secondary | ICD-10-CM

## 2017-06-20 DIAGNOSIS — I482 Chronic atrial fibrillation: Secondary | ICD-10-CM | POA: Diagnosis not present

## 2017-06-20 NOTE — Progress Notes (Signed)
Electrophysiology Office Note   Date:  06/20/2017   ID:  Evan [name] [birth year], MRN [id number]  PCP:  Leonel Ramsay, MD  Cardiologist:  [name] Primary Electrophysiologist:  Constance Haw, MD    Chief Complaint  Patient presents with  . Pacemaker Check    Sinus node dysfunction/91 days post implant     History of Present Illness: NICHOALS [name] is a 77 y.o. male who is being seen today for the evaluation of atrial fibrillation at the request of Evan [name]. Presenting today for electrophysiology evaluation.  Is a history of coronary artery disease and atrial fibrillation.  He is status post coronary artery bypass in 2005.  He had a cath in 2007 with patent grafts.  He underwent left carotid endarterectomy May 2014.  He has been intolerant of statins due to myalgias and weakness.  He has permanent atrial fibrillation.  His heart rate has been slow in the past and thus metoprolol has been stopped. He had a St. Jude pacemaker implanted 03/23/17.  Today, denies symptoms of palpitations, chest pain, shortness of breath, orthopnea, PND, lower extremity edema, claudication, dizziness, presyncope, syncope, bleeding, or neurologic sequela. The patient is tolerating medications without difficulties.  Improved since his pacemaker was implanted.  He has more energy.  He does say that he has been working with cardiac rehab which has been bothering his knees.   Past Medical History:  Diagnosis Date  . Anemia   . Carotid arterial disease ([place])   . CHF (congestive heart failure) ([place])   . Coronary artery disease   . HOH (hard of hearing)   . Hyperlipidemia   . Hypertension   . Nocturia   . Obesity   . Osteoarthritis    "BUE; BLE; right knee" (03/23/2017)  . Peripheral vision loss, bilateral    S/P 07/2015  . Permanent atrial fibrillation ([place])   . 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 Severe aortic stenosis   . Sinus drainage   . Stroke [facility]) 07/2015   "peripheral vision still bad out of left eye" (03/23/2017)  . Torn rotator cuff 2012   Right arm  . Type II diabetes mellitus ([place])   . Urinary frequency    Past Surgical History:  Procedure Laterality Date  . BACK SURGERY    . CARDIAC CATHETERIZATION  11/29/2005   SEVERE THREE VESSEL OBSTRUCTIVE ATHERSCLEROTIC CAD. ALL GRAFTS WERE PATENT. EF 65%  . CARDIAC CATHETERIZATION  03/23/2017  . CAROTID ENDARTERECTOMY Right 01-29-08   cea  . CARPAL TUNNEL RELEASE Right   . CORONARY ARTERY BYPASS GRAFT  03/2003   X3. LIMA GRAFT TO THE LAD, SAPHENOUS VEIN GRAFT TO THE PA, AND A LEFT RADIAL GRAFT TO THEOBTUSE MARGINAL VESSEL  . ENDARTERECTOMY Left 05/28/2012   Procedure: ENDARTERECTOMY CAROTID;  Surgeon: Elam Dutch, MD;  Location: [place];  Service: Vascular;  Laterality: Left;  . INSERT / REPLACE / REMOVE PACEMAKER  03/23/2017  . [place]; 1984  . MOLE SURGERY     "? face/arms"  . PACEMAKER IMPLANT N/A 03/23/2017   Procedure: PACEMAKER IMPLANT;  Surgeon: Constance Haw, MD;  Location: [place] CV LAB;  Service: Cardiovascular;  Laterality: N/A;  . PATCH ANGIOPLASTY Left 05/28/2012   Procedure: WITH dACRON PATCH ANGIOPLASTY;  Surgeon: Elam Dutch, MD;  Location: [place];  Service: Vascular;  Laterality: Left;  . RIGHT/LEFT HEART CATH AND CORONARY/GRAFT ANGIOGRAPHY N/A 03/23/2017   Procedure: RIGHT/LEFT HEART CATH AND  CORONARY/GRAFT ANGIOGRAPHY;  Surgeon: Sherren Mocha, MD;  Location: [place] CV LAB;  Service: Cardiovascular;  Laterality: N/A;  . TEE WITHOUT CARDIOVERSION N/A 05/09/2017   Procedure: TRANSESOPHAGEAL ECHOCARDIOGRAM (TEE);  Surgeon: Sherren Mocha, MD;  Location: [place];  Service: Open Heart Surgery;  Laterality: N/A;  . TRANSCATHETER AORTIC VALVE REPLACEMENT, TRANSFEMORAL N/A 05/09/2017   Procedure: TRANSCATHETER AORTIC VALVE REPLACEMENT, TRANSFEMORAL using an Edwards 44mm Sapien 3 Aortic Valve;  Surgeon: Sherren Mocha, MD;  Location: [place];  Service:  Open Heart Surgery;  Laterality: N/A;     Current Outpatient Medications  Medication Sig Dispense Refill  . acetaminophen (TYLENOL) 650 MG CR tablet Take 1,300 mg by mouth every 8 (eight) hours as needed for pain.     Marland Kitchen amLODipine (NORVASC) 5 MG tablet Take 1 tablet (5 mg total) by mouth daily. 90 tablet 3  . apixaban (ELIQUIS) 5 MG TABS tablet Take 1 tablet (5 mg total) by mouth 2 (two) times daily. 28 tablet 0  . aspirin EC 81 MG EC tablet Take 1 tablet (81 mg total) by mouth daily.    . benazepril (LOTENSIN) 40 MG tablet Take 1 tablet (40 mg total) by mouth daily. 90 tablet 3  . Camphor-Menthol-Methyl Sal (MUSCLE RUB ULTRA STRENGTH) 05-03-28 % CREA Apply 1 application topically 3 (three) times daily as needed (muscle pain).    . Cyanocobalamin (RA VITAMIN B-12 TR) 1000 MCG TBCR Take 1,000 mcg by mouth daily.     Marland Kitchen ezetimibe (ZETIA) 10 MG tablet Take 1 tablet (10 mg total) by mouth daily. (Patient taking differently: Take 10 mg by mouth every evening. ) 30 tablet 11  . fluticasone (FLONASE) 50 MCG/ACT nasal spray Place 2 sprays into both nostrils daily.    Marland Kitchen glimepiride (AMARYL) 2 MG tablet Take 2 mg by mouth daily with breakfast.    . guaiFENesin-codeine 100-10 MG/5ML syrup Take 5 mLs by mouth every 6 (six) hours as needed for cough.     . hydrochlorothiazide (HYDRODIURIL) 25 MG tablet Take 1 tablet (25 mg total) by mouth every morning. 90 tablet 1  . OVER THE COUNTER MEDICATION Replenex tablets: Take 3 tablet by mouth every morning (joint health)    . Polyvinyl Alcohol-Povidone (REFRESH OP) Place 2 drops into both eyes daily as needed (for dry eyes).     . rosuvastatin (CRESTOR) 20 MG tablet Take 1 tablet (20 mg total) by mouth daily. (Patient taking differently: Take 20 mg by mouth every evening. ) 30 tablet 0  . tamsulosin (FLOMAX) 0.4 MG CAPS capsule TAKE 1 CAPSULE BY MOUTH ONCE DAILY (Patient taking differently: TAKE 0.4 MG BY MOUTH ONCE DAILY IN THE EVENING) 90 capsule 3   No current  facility-administered medications for this visit.     Allergies:   Niacin and related; Metformin and related; and Statins   Social History:  The patient  reports that he quit smoking about 23 years ago. His smoking use included cigarettes. He has a 78.00 pack-year smoking history. He has never used smokeless tobacco. He reports that he does not drink alcohol or use drugs.   Family History:  The patient's family history includes Arrhythmia in his brother; Cancer in his brother; Heart attack in his brother and father; Parkinsonism in his brother.    ROS:  Please see the history of present illness.   Otherwise, review of systems is positive for neck pain, hearing loss, visual disturbance, dyspnea on exertion, muscle pain, joint swelling, rash, dizziness.   All other systems  are reviewed and negative.   PHYSICAL EXAM: VS:  BP 130/64   Pulse 63   Ht 6' (1.829 m)   Wt 265 lb (120.2 kg)   SpO2 95%   BMI 35.94 kg/m  , BMI Body mass index is 35.94 kg/m. GEN: Well nourished, well developed, in no acute distress  HEENT: normal  Neck: no JVD, carotid bruits, or masses Cardiac: RRR; no murmurs, rubs, or gallops,no edema  Respiratory:  clear to auscultation bilaterally, normal work of breathing GI: soft, nontender, nondistended, + BS MS: no deformity or atrophy  Skin: warm and dry, device site well healed Neuro:  Strength and sensation are intact Psych: euthymic mood, full affect  EKG:  EKG is ordered today. Personal review of the ekg ordered shows atrial fibrillation, ventricular paced  Personal review of the device interrogation today. Results in [place]: 02/07/2017: TSH 0.855 05/05/2017: ALT 20; B Natriuretic Peptide 280.0 05/10/2017: BUN 18; Creatinine, Ser 1.14; Hemoglobin 10.8; Magnesium 2.0; Platelets 188; Potassium 4.8; Sodium 137    Lipid Panel     Component Value Date/Time   CHOL 116 02/07/2017 1516   TRIG 81 02/07/2017 1516   HDL 48 02/07/2017 1516   CHOLHDL  2.3 12/11/2015 0854   VLDL 12 12/11/2015 0854   LDLCALC 52 02/07/2017 1516     Wt Readings from Last 3 Encounters:  06/20/17 265 lb (120.2 kg)  06/14/17 261 lb (118.4 kg)  05/29/17 259 lb (117.5 kg)      Other studies Reviewed: Additional studies/ records that were reviewed today include: TTE 08/25/15  Review of the above records today demonstrates:  - Left ventricle: The cavity size was normal. Wall thickness was   increased in a pattern of severe LVH. Systolic function was   normal. The estimated ejection fraction was in the range of 50%   to 55%. Wall motion was normal; there were no regional wall   motion abnormalities. The study is not technically sufficient to   allow evaluation of LV diastolic function. - Aortic valve: Moderately calcified. Moderate stenosis. Trivial   regurgitation. Mean gradient (S): 26 mm Hg. Peak gradient (S): 50   mm Hg. Valve area (VTI): 0.93 cm^2. Valve area (Vmax): 1.01 cm^2.   Valve area (Vmean): 0.96 cm^2. - Mitral valve: Calcified annulus. Mildly thickened leaflets .   There was trivial regurgitation. - Left atrium: Severely dilated. - Systemic veins: The IVC measures >2.1 cm, but collapses >50%,   suggesting an elevated RA pressure of 8 mmHg.  Holter 02/20/17 - personally reviewed  Atrial fibrillation with slow ventricular response  Frequent pauses- longest 5.08 seconds  Occasional PVCs  ASSESSMENT AND PLAN:  1.  Atrial fibrillation, permanent: currently on Eliquis s/p St. Jude pacemaker implanted 03/23/17 due to significant bradycardia.  Device functioning appropriately.  No changes.  This patients CHA2DS2-VASc Score and unadjusted Ischemic Stroke Rate (% per year) is equal to 9.7 % stroke rate/year from a score of 6  Above score calculated as 1 point each if present [CHF, HTN, DM, Vascular=MI/PAD/Aortic Plaque, Age if 65-74, or Male] Above score calculated as 2 points each if present [Age > 75, or Stroke/TIA/TE]   2.  Coronary  artery disease: This post CABG in 2015.  No current chest pain.  3.  Moderate to severe aortic stenosis: Per echo in 2017.  Is now status post TAVR 05/09/2017.  4.  Hypertension: Blood pressure well controlled today.  No changes.  5.  CVA with hemorrhagic transformation: Continue  Eliquis  Current medicines are reviewed at length with the patient today.   The patient does not have concerns regarding his medicines.  The following changes were made today: None  Labs/ tests ordered today include:  Orders Placed This Encounter  Procedures  . EKG 12-Lead     Disposition:   FU with Beckham Capistran 12 months  Signed, Edynn Gillock Meredith Leeds, MD  06/20/2017 2:57 PM     [address] [place] [address] [phone] (office) [phone] (fax)

## 2017-06-20 NOTE — Patient Instructions (Signed)
Medication Instructions:  Your physician recommends that you continue on your current medications as directed. Please refer to the Current Medication list given to you today.  *If you need a refill on your cardiac medications before your next appointment, please call your pharmacy*  Labwork: None ordered  Testing/Procedures: None ordered  Follow-Up: Remote monitoring is used to monitor your Pacemaker or ICD from home. This monitoring reduces the number of office visits required to check your device to one time per year. It allows [place] to keep an eye on the functioning of your device to ensure it is working properly. You are scheduled for a device check from home on 09/19/2017. You may send your transmission at any time that day. If you have a wireless device, the transmission will be sent automatically. After your physician reviews your transmission, you will receive a postcard with your next transmission date.  Your physician wants you to follow-up in: 1 year with Dr. Curt Bears.  You will receive a reminder letter in the mail two months in advance. If you don't receive a letter, please call our office to schedule the follow-up appointment.  Thank you for choosing [facility]!!   Trinidad Curet, RN [phone]

## 2017-06-22 DIAGNOSIS — Z952 Presence of prosthetic heart valve: Secondary | ICD-10-CM | POA: Diagnosis not present

## 2017-06-22 NOTE — Progress Notes (Signed)
Daily Session Note  Patient Details  Name: Evan [name] MRN: [id number] Date of Birth: 1940/05/23 Referring Provider:     Cardiac Rehab from 05/29/2017 in [facility] Cardiac and Pulmonary Rehab  Referring Provider  [name], [name] MD      Encounter Date: 06/22/2017  Check In: Session Check In - 06/22/17 0846      Check-In   Location  [facility]-Cardiac & Pulmonary Rehab    Staff Present  Gerlene Burdock, RN, BSN;Jessica Luan Pulling, MA, RCEP, CCRP, Exercise Physiologist;Amanda Oletta Darter, [place], ACSM CEP, Exercise Physiologist    Supervising physician immediately available to respond to emergencies  See telemetry face sheet for immediately available ER MD    Medication changes reported      No    Fall or balance concerns reported     No    Warm-up and Cool-down  Performed on first and last piece of equipment    Resistance Training Performed  Yes    VAD Patient?  No      Pain Assessment   Currently in Pain?  No/denies    Multiple Pain Sites  No          Social History   Tobacco Use  Smoking Status Former Smoker  . Packs/day: 2.00  . Years: 39.00  . Pack years: 78.00  . Types: Cigarettes  . Last attempt to quit: 01/24/1994  . Years since quitting: 23.4  Smokeless Tobacco Never Used    Goals Met:  Independence with exercise equipment Exercise tolerated well No report of cardiac concerns or symptoms Strength training completed today  Goals Unmet:  Not Applicable  Comments: Pt able to follow exercise prescription today without complaint.  Will continue to monitor for progression.    Dr. Emily Filbert is Medical Director for [place] and [facility].

## 2017-06-27 ENCOUNTER — Encounter: Payer: Medicare Other | Attending: Cardiology

## 2017-06-27 DIAGNOSIS — Z79899 Other long term (current) drug therapy: Secondary | ICD-10-CM | POA: Insufficient documentation

## 2017-06-27 DIAGNOSIS — Z8673 Personal history of transient ischemic attack (TIA), and cerebral infarction without residual deficits: Secondary | ICD-10-CM | POA: Insufficient documentation

## 2017-06-27 DIAGNOSIS — I509 Heart failure, unspecified: Secondary | ICD-10-CM | POA: Insufficient documentation

## 2017-06-27 DIAGNOSIS — Z7982 Long term (current) use of aspirin: Secondary | ICD-10-CM | POA: Insufficient documentation

## 2017-06-27 DIAGNOSIS — I110 Hypertensive heart disease with heart failure: Secondary | ICD-10-CM | POA: Insufficient documentation

## 2017-06-27 DIAGNOSIS — Z952 Presence of prosthetic heart valve: Secondary | ICD-10-CM | POA: Insufficient documentation

## 2017-06-27 DIAGNOSIS — E785 Hyperlipidemia, unspecified: Secondary | ICD-10-CM | POA: Insufficient documentation

## 2017-06-27 DIAGNOSIS — Z87891 Personal history of nicotine dependence: Secondary | ICD-10-CM | POA: Insufficient documentation

## 2017-06-27 DIAGNOSIS — M1711 Unilateral primary osteoarthritis, right knee: Secondary | ICD-10-CM | POA: Diagnosis not present

## 2017-06-27 DIAGNOSIS — E119 Type 2 diabetes mellitus without complications: Secondary | ICD-10-CM | POA: Insufficient documentation

## 2017-06-27 NOTE — Progress Notes (Signed)
Daily Session Note  Patient Details  Name: Evan [name] MRN: [id number] Date of Birth: 1940-12-22 Referring Provider:     Cardiac Rehab from 05/29/2017 in [facility] Cardiac and Pulmonary Rehab  Referring Provider  [name], [name] MD      Encounter Date: 06/27/2017  Check In: Session Check In - 06/27/17 0831      Check-In   Location  [facility]-Cardiac & Pulmonary Rehab    Staff Present  Justin Mend RCP,RRT,BSRT;Heath Lark, RN, BSN, Lance Sell, BA, ACSM CEP, Exercise Physiologist    Supervising physician immediately available to respond to emergencies  See telemetry face sheet for immediately available ER MD    Medication changes reported      No    Fall or balance concerns reported     No    Tobacco Cessation  No Change    Warm-up and Cool-down  Performed on first and last piece of equipment    Resistance Training Performed  Yes    VAD Patient?  No      Pain Assessment   Currently in Pain?  No/denies          Social History   Tobacco Use  Smoking Status Former Smoker  . Packs/day: 2.00  . Years: 39.00  . Pack years: 78.00  . Types: Cigarettes  . Last attempt to quit: 01/24/1994  . Years since quitting: 23.4  Smokeless Tobacco Never Used    Goals Met:  Independence with exercise equipment Exercise tolerated well No report of cardiac concerns or symptoms Strength training completed today  Goals Unmet:  Not Applicable  Comments: Pt able to follow exercise prescription today without complaint.  Will continue to monitor for progression.   Dr. Emily Filbert is Medical Director for [place] and [facility].

## 2017-06-28 ENCOUNTER — Encounter: Payer: Self-pay | Admitting: Thoracic Surgery (Cardiothoracic Vascular Surgery)

## 2017-06-29 ENCOUNTER — Encounter: Payer: Self-pay | Admitting: Thoracic Surgery (Cardiothoracic Vascular Surgery)

## 2017-06-29 DIAGNOSIS — Z952 Presence of prosthetic heart valve: Secondary | ICD-10-CM

## 2017-06-29 NOTE — Progress Notes (Signed)
Daily Session Note  Patient Details  Name: Evan [name] MRN: [id number] Date of Birth: 1940-12-20 Referring Provider:     Cardiac Rehab from 05/29/2017 in [facility] Cardiac and Pulmonary Rehab  Referring Provider  [name], [name] MD      Encounter Date: 06/29/2017  Check In: Session Check In - 06/29/17 0958      Check-In   Location  [facility]-Cardiac & Pulmonary Rehab    Staff Present  Joellyn Rued, BS, PEC;Nana Addai, RN BSN;Susanne Bice, RN, BSN, CCRP;Amanda Sommer, BA, ACSM CEP, Exercise Physiologist    Supervising physician immediately available to respond to emergencies  See telemetry face sheet for immediately available ER MD    Medication changes reported      No    Fall or balance concerns reported     No    Warm-up and Cool-down  Performed on first and last piece of equipment    Resistance Training Performed  Yes    VAD Patient?  No      Pain Assessment   Currently in Pain?  No/denies    Multiple Pain Sites  No          Social History   Tobacco Use  Smoking Status Former Smoker  . Packs/day: 2.00  . Years: 39.00  . Pack years: 78.00  . Types: Cigarettes  . Last attempt to quit: 01/24/1994  . Years since quitting: 23.4  Smokeless Tobacco Never Used    Goals Met:  Independence with exercise equipment Exercise tolerated well No report of cardiac concerns or symptoms Strength training completed today  Goals Unmet:  Not Applicable  Comments: Pt able to follow exercise prescription today without complaint.  Will continue to monitor for progression.    Dr. Emily Filbert is Medical Director for [place] and [facility].

## 2017-07-04 DIAGNOSIS — Z952 Presence of prosthetic heart valve: Secondary | ICD-10-CM

## 2017-07-04 NOTE — Progress Notes (Signed)
Daily Session Note  Patient Details  Name: Evan [name] MRN: [id number] Date of Birth: 10-24-1940 Referring Provider:     Cardiac Rehab from 05/29/2017 in [facility] Cardiac and Pulmonary Rehab  Referring Provider  [name], [name] MD      Encounter Date: 07/04/2017  Check In: Session Check In - 07/04/17 0913      Check-In   Location  [facility]-Cardiac & Pulmonary Rehab    Staff Present  Joellyn Rued, BS, PEC;Susanne Bice, RN, BSN, CCRP;Jessica Gary, MA, RCEP, CCRP, Exercise Physiologist;Novak Stgermaine Oletta Darter, [place], ACSM CEP, Exercise Physiologist    Supervising physician immediately available to respond to emergencies  See telemetry face sheet for immediately available ER MD    Medication changes reported      No    Fall or balance concerns reported     No    Warm-up and Cool-down  Performed on first and last piece of equipment    Resistance Training Performed  Yes    VAD Patient?  No      Pain Assessment   Currently in Pain?  No/denies    Multiple Pain Sites  No          Social History   Tobacco Use  Smoking Status Former Smoker  . Packs/day: 2.00  . Years: 39.00  . Pack years: 78.00  . Types: Cigarettes  . Last attempt to quit: 01/24/1994  . Years since quitting: 23.4  Smokeless Tobacco Never Used    Goals Met:  Independence with exercise equipment Exercise tolerated well No report of cardiac concerns or symptoms  Goals Unmet:  Not Applicable  Comments: Pt able to follow exercise prescription today without complaint.  Will continue to monitor for progression.    Dr. Emily Filbert is Medical Director for [place] and [facility].

## 2017-07-06 DIAGNOSIS — Z952 Presence of prosthetic heart valve: Secondary | ICD-10-CM

## 2017-07-06 NOTE — Progress Notes (Signed)
Daily Session Note  Patient Details  Name: Evan [name] MRN: [id number] Date of Birth: [birth year] Referring Provider:     Cardiac Rehab from 05/29/2017 in [facility] Cardiac and Pulmonary Rehab  Referring Provider  [name], [name] MD      Encounter Date: 07/06/2017  Check In: Session Check In - 07/06/17 0842      Check-In   Location  [facility]-Cardiac & Pulmonary Rehab    Staff Present  Gerlene Burdock, RN, BSN;Jessica Luan Pulling, MA, RCEP, CCRP, Exercise Physiologist;Sian Rockers Oletta Darter, [place], ACSM CEP, Exercise Physiologist    Supervising physician immediately available to respond to emergencies  See telemetry face sheet for immediately available ER MD    Medication changes reported      No    Fall or balance concerns reported     No    Warm-up and Cool-down  Performed on first and last piece of equipment    Resistance Training Performed  Yes    VAD Patient?  No      Pain Assessment   Currently in Pain?  No/denies    Multiple Pain Sites  No          Social History   Tobacco Use  Smoking Status Former Smoker  . Packs/day: 2.00  . Years: 39.00  . Pack years: 78.00  . Types: Cigarettes  . Last attempt to quit: 01/24/1994  . Years since quitting: 23.4  Smokeless Tobacco Never Used    Goals Met:  Independence with exercise equipment Exercise tolerated well No report of cardiac concerns or symptoms Strength training completed today  Goals Unmet:  Not Applicable  Comments: Pt able to follow exercise prescription today without complaint.  Will continue to monitor for progression.    Dr. Emily Filbert is Medical Director for [place] and [facility].

## 2017-07-11 DIAGNOSIS — Z952 Presence of prosthetic heart valve: Secondary | ICD-10-CM | POA: Diagnosis not present

## 2017-07-11 NOTE — Progress Notes (Signed)
Daily Session Note  Patient Details  Name: Evan [name] MRN: [id number] Date of Birth: August 20, 1940 Referring Provider:     Cardiac Rehab from 05/29/2017 in [facility] Cardiac and Pulmonary Rehab  Referring Provider  [name], [name] MD      Encounter Date: 07/11/2017  Check In: Session Check In - 07/11/17 0829      Check-In   Location  [facility]-Cardiac & Pulmonary Rehab    Staff Present  Heath Lark, RN, BSN, CCRP;Jessica Luan Pulling, MA, RCEP, CCRP, Exercise Physiologist;Daquarius Dubeau Oletta Darter, [place], ACSM CEP, Exercise Physiologist    Supervising physician immediately available to respond to emergencies  See telemetry face sheet for immediately available ER MD    Medication changes reported      No    Fall or balance concerns reported     No    Warm-up and Cool-down  Performed on first and last piece of equipment    Resistance Training Performed  Yes    VAD Patient?  No      Pain Assessment   Currently in Pain?  No/denies    Multiple Pain Sites  No          Social History   Tobacco Use  Smoking Status Former Smoker  . Packs/day: 2.00  . Years: 39.00  . Pack years: 78.00  . Types: Cigarettes  . Last attempt to quit: 01/24/1994  . Years since quitting: 23.4  Smokeless Tobacco Never Used    Goals Met:  Independence with exercise equipment Exercise tolerated well No report of cardiac concerns or symptoms Strength training completed today  Goals Unmet:  Not Applicable  Comments: Pt able to follow exercise prescription today without complaint.  Will continue to monitor for progression.    Dr. Emily Filbert is Medical Director for [place] and [facility].

## 2017-07-12 ENCOUNTER — Encounter: Payer: Self-pay | Admitting: *Deleted

## 2017-07-12 DIAGNOSIS — Z952 Presence of prosthetic heart valve: Secondary | ICD-10-CM

## 2017-07-12 NOTE — Progress Notes (Signed)
Cardiac Individual Treatment Plan  Patient Details  Name: Evan [name] MRN: [id number] Date of Birth: Jan 01, 1941 Referring Provider:     Cardiac Rehab from 05/29/2017 in [facility] Cardiac and Pulmonary Rehab  Referring Provider  [name], [name] MD      Initial Encounter Date:    Cardiac Rehab from 05/29/2017 in [facility] Cardiac and Pulmonary Rehab  Date  05/29/17  Referring Provider  [name], [name] MD      Visit Diagnosis: S/P TAVR (transcatheter aortic valve replacement)  Patient's Home Medications on Admission:  Current Outpatient Medications:  .  acetaminophen (TYLENOL) 650 MG CR tablet, Take 1,300 mg by mouth every 8 (eight) hours as needed for pain. , Disp: , Rfl:  .  amLODipine (NORVASC) 5 MG tablet, Take 1 tablet (5 mg total) by mouth daily., Disp: 90 tablet, Rfl: 3 .  apixaban (ELIQUIS) 5 MG TABS tablet, Take 1 tablet (5 mg total) by mouth 2 (two) times daily., Disp: 28 tablet, Rfl: 0 .  aspirin EC 81 MG EC tablet, Take 1 tablet (81 mg total) by mouth daily., Disp: , Rfl:  .  benazepril (LOTENSIN) 40 MG tablet, Take 1 tablet (40 mg total) by mouth daily., Disp: 90 tablet, Rfl: 3 .  Camphor-Menthol-Methyl Sal (MUSCLE RUB ULTRA STRENGTH) 05-03-28 % CREA, Apply 1 application topically 3 (three) times daily as needed (muscle pain)., Disp: , Rfl:  .  Cyanocobalamin (RA VITAMIN B-12 TR) 1000 MCG TBCR, Take 1,000 mcg by mouth daily. , Disp: , Rfl:  .  ezetimibe (ZETIA) 10 MG tablet, Take 1 tablet (10 mg total) by mouth daily. (Patient taking differently: Take 10 mg by mouth every evening. ), Disp: 30 tablet, Rfl: 11 .  fluticasone (FLONASE) 50 MCG/ACT nasal spray, Place 2 sprays into both nostrils daily., Disp: , Rfl:  .  glimepiride (AMARYL) 2 MG tablet, Take 2 mg by mouth daily with breakfast., Disp: , Rfl:  .  guaiFENesin-codeine 100-10 MG/5ML syrup, Take 5 mLs by mouth every 6 (six) hours as needed for cough. , Disp: , Rfl:  .  hydrochlorothiazide (HYDRODIURIL) 25 MG tablet, Take 1 tablet (25  mg total) by mouth every morning., Disp: 90 tablet, Rfl: 1 .  OVER THE COUNTER MEDICATION, Replenex tablets: Take 3 tablet by mouth every morning (joint health), Disp: , Rfl:  .  Polyvinyl Alcohol-Povidone (REFRESH OP), Place 2 drops into both eyes daily as needed (for dry eyes). , Disp: , Rfl:  .  rosuvastatin (CRESTOR) 20 MG tablet, Take 1 tablet (20 mg total) by mouth daily. (Patient taking differently: Take 20 mg by mouth every evening. ), Disp: 30 tablet, Rfl: 0 .  tamsulosin (FLOMAX) 0.4 MG CAPS capsule, TAKE 1 CAPSULE BY MOUTH ONCE DAILY (Patient taking differently: TAKE 0.4 MG BY MOUTH ONCE DAILY IN THE EVENING), Disp: 90 capsule, Rfl: 3  Past Medical History: Past Medical History:  Diagnosis Date  . Anemia   . Carotid arterial disease ([place])   . CHF (congestive heart failure) ([place])   . Coronary artery disease   . HOH (hard of hearing)   . Hyperlipidemia   . Hypertension   . Nocturia   . Obesity   . Osteoarthritis    "BUE; BLE; right knee" (03/23/2017)  . Peripheral vision loss, bilateral    S/P 07/2015  . Permanent atrial fibrillation ([place])   . 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 Severe aortic stenosis   . Sinus drainage   . Stroke [facility]) 07/2015   "peripheral vision  still bad out of left eye" (03/23/2017)  . Torn rotator cuff 2012   Right arm  . Type II diabetes mellitus ([place])   . Urinary frequency     Tobacco Use: Social History   Tobacco Use  Smoking Status Former Smoker  . Packs/day: 2.00  . Years: 39.00  . Pack years: 78.00  . Types: Cigarettes  . Last attempt to quit: 01/24/1994  . Years since quitting: 23.4  Smokeless Tobacco Never Used    Labs: Recent Chemical engineer    Labs for ITP Cardiac and Pulmonary Rehab Latest Ref Rng & Units 05/05/2017 05/09/2017 05/09/2017 05/09/2017 05/09/2017   Cholestrol 100 - 199 mg/dL - - - - -   LDLCALC 0 - 99 mg/dL - - - - -   HDL >39 mg/dL - - - - -   Trlycerides 0 - 149 mg/dL - - - - -   Hemoglobin  A1c 4.8 - 5.6 % 6.8(H) - - - -   PHART 7.350 - 7.450 7.445 - 7.310(L) - 7.337(L)   PCO2ART 32.0 - 48.0 mmHg 34.1 - 52.0(H) - 46.0   HCO3 20.0 - 28.0 mmol/L 23.1 - 26.1 - 24.7   TCO2 22 - 32 mmol/L - '27 28 26 26   ' ACIDBASEDEF 0.0 - 2.0 mmol/L 0.5 - 1.0 - 1.0   O2SAT % 98.3 - 99.0 - 99.0       Exercise Target Goals:    Exercise Program Goal: Individual exercise prescription set using results from initial 6 min walk test and THRR while considering  patient's activity barriers and safety.   Exercise Prescription Goal: Initial exercise prescription builds to 30-45 minutes a day of aerobic activity, 2-3 days per week.  Home exercise guidelines will be given to patient during program as part of exercise prescription that the participant will acknowledge.  Activity Barriers & Risk Stratification: Activity Barriers & Cardiac Risk Stratification - 05/29/17 1416      Activity Barriers & Cardiac Risk Stratification   Activity Barriers  Joint Problems;Muscular Weakness;Shortness of Breath;Other (comment);Deconditioning;Back Problems;Arthritis;Balance Concerns    Comments  both arms- issues lifting and griping , arthritis in both knees (left side a little worse)    Cardiac Risk Stratification  Moderate       6 Minute Walk: 6 Minute Walk    Row Name 05/29/17 1431         6 Minute Walk   Phase  Initial     Distance  980 feet     Walk Time  6 minutes     # of Rest Breaks  0     MPH  1.86     METS  1.8     RPE  13     Perceived Dyspnea   3     VO2 Peak  6.32     Symptoms  Yes (comment)     Comments  hip/leg pain 5/10, SOB     Resting HR  64 bpm     Resting BP  128/62     Resting Oxygen Saturation   97 %     Exercise Oxygen Saturation  during 6 min walk  96 %     Max Ex. HR  89 bpm     Max Ex. BP  148/74     2 Minute Post BP  132/60        Oxygen Initial Assessment:   Oxygen Re-Evaluation:   Oxygen Discharge (Final Oxygen Re-Evaluation):   Initial Exercise  Prescription: Initial Exercise Prescription - 05/29/17 1400      Date of Initial Exercise RX and Referring Provider   Date  05/29/17    Referring Provider  [name], [name] MD      Treadmill   MPH  1.1    Grade  0    Minutes  15    METs  1.84      Recumbant Bike   Level  1    RPM  40    Minutes  15    METs  1.8      T5 Nustep   Level  1    SPM  80    Minutes  15    METs  1.8      Prescription Details   Frequency (times per week)  2    Duration  Progress to 30 minutes of continuous aerobic without signs/symptoms of physical distress      Intensity   THRR 40-80% of Max Heartrate  98-131    Ratings of Perceived Exertion  11-13    Perceived Dyspnea  0-4      Progression   Progression  Continue to progress workloads to maintain intensity without signs/symptoms of physical distress.      Resistance Training   Training Prescription  Yes    Weight  3 lbs    Reps  10-15       Perform Capillary Blood Glucose checks as needed.  Exercise Prescription Changes: Exercise Prescription Changes    Row Name 05/29/17 1400 06/06/17 1200 06/15/17 1000 06/20/17 1400 07/04/17 1400     Response to Exercise   Blood Pressure (Admit)  128/62  128/78  -  168/72  132/74   Blood Pressure (Exercise)  148/74  168/80  -  132/70  132/70   Blood Pressure (Exit)  132/60  124/70  -  136/66  124/74   Heart Rate (Admit)  64 bpm  75 bpm  -  63 bpm  59 bpm   Heart Rate (Exercise)  89 bpm  96 bpm  -  89 bpm  83 bpm   Heart Rate (Exit)  53 bpm  67 bpm  -  62 bpm  68 bpm   Oxygen Saturation (Admit)  97 %  -  -  -  -   Oxygen Saturation (Exercise)  96 %  -  -  -  -   Rating of Perceived Exertion (Exercise)  13  15  -  15  10   Perceived Dyspnea (Exercise)  3  -  -  -  -   Symptoms  hip/leg pain 5/10, SOB  none  -  none  none   Comments  walk test results  second full day of exercise  -  -  -   Duration  -  Continue with 30 min of aerobic exercise without signs/symptoms of physical distress.  -   Continue with 30 min of aerobic exercise without signs/symptoms of physical distress.  Continue with 30 min of aerobic exercise without signs/symptoms of physical distress.   Intensity  -  THRR unchanged  -  THRR unchanged  THRR unchanged     Progression   Progression  -  Continue to progress workloads to maintain intensity without signs/symptoms of physical distress.  -  Continue to progress workloads to maintain intensity without signs/symptoms of physical distress.  Continue to progress workloads to maintain intensity without signs/symptoms of physical distress.   Average METs  -  1.82  -  1.83  1.88     Resistance Training   Training Prescription  -  Yes  -  Yes  Yes   Weight  -  3 lbs  -  3 lbs  3 lbs   Reps  -  10-15  -  10-15  10-15     Interval Training   Interval Training  -  No  -  No  No     Treadmill   MPH  -  1.1  -  1  1.5   Grade  -  0  -  0  0   Minutes  -  15  -  1  15   METs  -  1.84  -  1.7  2.15     Recumbant Bike   Level  -  -  -  1  3   Minutes  -  -  -  15  15   METs  -  -  -  2  1.7     T5 Nustep   Level  -  1  -  2  2   Minutes  -  15  -  15  15   METs  -  1.8  -  1.8  1.8     Home Exercise Plan   Plans to continue exercise at  -  -  Home (comment) walking  Home (comment) walking  Home (comment) walking   Frequency  -  -  Add 3 additional days to program exercise sessions.  Add 3 additional days to program exercise sessions.  Add 3 additional days to program exercise sessions.   Initial Home Exercises Provided  -  -  06/15/17  06/15/17  06/15/17      Exercise Comments: Exercise Comments    Row Name 06/01/17 [phone]           Exercise Comments  First full day of exercise!  Patient was oriented to gym and equipment including functions, settings, policies, and procedures.  Patient's individual exercise prescription and treatment plan were reviewed.  All starting workloads were established based on the results of the 6 minute walk test done at initial  orientation visit.  The plan for exercise progression was also introduced and progression will be customized based on patient's performance and goals.          Exercise Goals and Review: Exercise Goals    Row Name 05/29/17 1443             Exercise Goals   Increase Physical Activity  Yes       Intervention  Provide advice, education, support and counseling about physical activity/exercise needs.;Develop an individualized exercise prescription for aerobic and resistive training based on initial evaluation findings, risk stratification, comorbidities and participant's personal goals.       Expected Outcomes  Short Term: Attend rehab on a regular basis to increase amount of physical activity.;Long Term: Exercising regularly at least 3-5 days a week.;Long Term: Add in home exercise to make exercise part of routine and to increase amount of physical activity.       Increase Strength and Stamina  Yes       Intervention  Provide advice, education, support and counseling about physical activity/exercise needs.;Develop an individualized exercise prescription for aerobic and resistive training based on initial evaluation findings, risk stratification, comorbidities and participant's personal goals.       Expected Outcomes  Short Term: Increase workloads from initial exercise prescription for  resistance, speed, and METs.;Short Term: Perform resistance training exercises routinely during rehab and add in resistance training at home;Long Term: Improve cardiorespiratory fitness, muscular endurance and strength as measured by increased METs and functional capacity (6MWT)       Able to understand and use rate of perceived exertion (RPE) scale  Yes       Intervention  Provide education and explanation on how to use RPE scale       Expected Outcomes  Short Term: Able to use RPE daily in rehab to express subjective intensity level;Long Term:  Able to use RPE to guide intensity level when exercising independently        Knowledge and understanding of Target Heart Rate Range (THRR)  Yes       Intervention  Provide education and explanation of THRR including how the numbers were predicted and where they are located for reference       Expected Outcomes  Short Term: Able to state/look up THRR;Short Term: Able to use daily as guideline for intensity in rehab;Long Term: Able to use THRR to govern intensity when exercising independently       Able to check pulse independently  Yes       Intervention  Provide education and demonstration on how to check pulse in carotid and radial arteries.;Review the importance of being able to check your own pulse for safety during independent exercise       Expected Outcomes  Short Term: Able to explain why pulse checking is important during independent exercise;Long Term: Able to check pulse independently and accurately       Understanding of Exercise Prescription  Yes       Intervention  Provide education, explanation, and written materials on patient's individual exercise prescription       Expected Outcomes  Short Term: Able to explain program exercise prescription;Long Term: Able to explain home exercise prescription to exercise independently          Exercise Goals Re-Evaluation : Exercise Goals Re-Evaluation    Row Name 06/01/17 8338 06/06/17 1134 06/15/17 1004 06/20/17 1437 07/04/17 1429     Exercise Goal Re-Evaluation   Exercise Goals Review  Knowledge and understanding of Target Heart Rate Range (THRR);Able to understand and use rate of perceived exertion (RPE) scale;Understanding of Exercise Prescription;Increase Physical Activity  Increase Physical Activity;Increase Strength and Stamina;Understanding of Exercise Prescription  Increase Physical Activity;Able to understand and use rate of perceived exertion (RPE) scale;Knowledge and understanding of Target Heart Rate Range (THRR);Understanding of Exercise Prescription  Increase Physical Activity;Understanding of Exercise  Prescription;Increase Strength and Stamina  Increase Physical Activity;Understanding of Exercise Prescription;Increase Strength and Stamina   Comments  Reviewed RPE scale, THR and program prescription with pt today.  Pt voiced understanding and was given a copy of goals to take home.   Evan [name] is off to a good start in rehab. He has completed two full days of exercise.  We will continue to monitor his progression.   Reviewed home exercise with pt today.  [place]  Reviewed THR, pulse, RPE, sign and symptoms, NTG use, and when to call 911 or MD.  Also discussed weather considerations and indoor options.  Pt voiced understanding.  Reviewed home exercise with pt today.  [place]  Reviewed THR, pulse, RPE, sign and symptoms, NTG use, and when to call 911 or MD.  Also discussed weather considerations and  indoor options.  Pt voiced understanding.  Evan [name] has been doing well in rehab.  He is still doing just above baseline workloads and they are finally starting to get easier for him.  We will begin to move up his workloads more.  Today, we increase his recumbent bike to level 3. We will continue to monitor his progress.    Expected Outcomes  Short: Use RPE daily to regulate intensity.  Long: Follow program prescription in THR.  Short: Continue to attend regularly.  Long: Continue to follow program prescription.   Short: Start to add in at least 30 min of walking on off days.  Long: Continue to increase physical activity.   Short: Continue to add walking in on off days.  Long: Continue to increase strength and stamina.   Short: Increase more workloads.  Long: Continue to work on increasing home exercise.       Discharge Exercise Prescription (Final Exercise Prescription Changes): Exercise Prescription Changes - 07/04/17 1400      Response to Exercise   Blood Pressure (Admit)  132/74    Blood Pressure (Exercise)  132/70    Blood  Pressure (Exit)  124/74    Heart Rate (Admit)  59 bpm    Heart Rate (Exercise)  83 bpm    Heart Rate (Exit)  68 bpm    Rating of Perceived Exertion (Exercise)  10    Symptoms  none    Duration  Continue with 30 min of aerobic exercise without signs/symptoms of physical distress.    Intensity  THRR unchanged      Progression   Progression  Continue to progress workloads to maintain intensity without signs/symptoms of physical distress.    Average METs  1.88      Resistance Training   Training Prescription  Yes    Weight  3 lbs    Reps  10-15      Interval Training   Interval Training  No      Treadmill   MPH  1.5    Grade  0    Minutes  15    METs  2.15      Recumbant Bike   Level  3    Minutes  15    METs  1.7      T5 Nustep   Level  2    Minutes  15    METs  1.8      Home Exercise Plan   Plans to continue exercise at  Home (comment) walking    Frequency  Add 3 additional days to program exercise sessions.    Initial Home Exercises Provided  06/15/17       Nutrition:  Target Goals: Understanding of nutrition guidelines, daily intake of sodium <1551m, cholesterol <2039m calories 30% from fat and 7% or less from saturated fats, daily to have 5 or more servings of fruits and vegetables.  Biometrics: Pre Biometrics - 05/29/17 1514      Pre Biometrics   Height  6' (1.829 m)    Weight  259 lb (117.5 kg)    Waist Circumference  45.25 inches    Hip Circumference  46 inches    Waist to Hip Ratio  0.98 %    BMI (Calculated)  35.12    Single Leg Stand  2.51 seconds        Nutrition Therapy Plan and Nutrition Goals: Nutrition Therapy & Goals - 06/29/17 0839      Intervention Plan   Intervention  --  Will set appt with RD Lattie Haw       Nutrition Assessments: Nutrition Assessments - 05/29/17 1406      MEDFICTS Scores   Pre Score  91       Nutrition Goals Re-Evaluation:   Nutrition Goals Discharge (Final Nutrition Goals  Re-Evaluation):   Psychosocial: Target Goals: Acknowledge presence or absence of significant depression and/or stress, maximize coping skills, provide positive support system. Participant is able to verbalize types and ability to use techniques and skills needed for reducing stress and depression.   Initial Review & Psychosocial Screening: Initial Psych Review & Screening - 05/29/17 1406      Initial Review   Current issues with  Current Sleep Concerns;Current Stress Concerns    Source of Stress Concerns  Unable to perform yard/household activities;Unable to participate in former interests or hobbies      Stephen?  Yes spouse      Barriers   Psychosocial barriers to participate in program  There are no identifiable barriers or psychosocial needs.;The patient should benefit from training in stress management and relaxation.      Screening Interventions   Interventions  Encouraged to exercise;Provide feedback about the scores to participant;Program counselor consult;To provide support and resources with identified psychosocial needs    Expected Outcomes  Short Term goal: Utilizing psychosocial counselor, staff and physician to assist with identification of specific Stressors or current issues interfering with healing process. Setting desired goal for each stressor or current issue identified.;Long Term Goal: Stressors or current issues are controlled or eliminated.;Short Term goal: Identification and review with participant of any Quality of Life or Depression concerns found by scoring the questionnaire.;Long Term goal: The participant improves quality of Life and PHQ9 Scores as seen by post scores and/or verbalization of changes       Quality of Life Scores:  Quality of Life - 05/29/17 1042      Quality of Life Scores   Health/Function Pre  22.65 %    Socioeconomic Pre  27.83 %    Psych/Spiritual Pre  30 %    Family Pre  28.5 %    GLOBAL Pre  26.18 %       Scores of 19 and below usually indicate a poorer quality of life in these areas.  A difference of  2-3 points is a clinically meaningful difference.  A difference of 2-3 points in the total score of the Quality of Life Index has been associated with significant improvement in overall quality of life, self-image, physical symptoms, and general health in studies assessing change in quality of life.  PHQ-9: Recent Review Flowsheet Data    Depression screen [facility] 2/9 05/29/2017   Decreased Interest 1   Down, Depressed, Hopeless 0   PHQ - 2 Score 1   Altered sleeping 2   Tired, decreased energy 1   Change in appetite 0   Feeling bad or failure about yourself  0   Trouble concentrating 2   Moving slowly or fidgety/restless 0   Suicidal thoughts 0   PHQ-9 Score 6   Difficult doing work/chores Somewhat difficult     Interpretation of Total Score  Total Score Depression Severity:  1-4 = Minimal depression, 5-9 = Mild depression, 10-14 = Moderate depression, 15-19 = Moderately severe depression, 20-27 = Severe depression   Psychosocial Evaluation and Intervention: Psychosocial Evaluation - 06/29/17 0102      Psychosocial Evaluation & Interventions   Interventions  Encouraged to exercise  with the program and follow exercise prescription       Psychosocial Re-Evaluation: Psychosocial Re-Evaluation    [place] Name 06/29/17 [phone]             Psychosocial Re-Evaluation   Current issues with  None Identified       Comments  Evan [name] continues to work with his exercise prescription, feeling somewhat stronger, not where he was last time he did the program.         Expected Outcomes  continues to attend class to build stamina and energy       Interventions  Relaxation education;Encouraged to attend Cardiac Rehabilitation for the exercise       Continue Psychosocial Services   Follow up required by staff          Psychosocial Discharge (Final Psychosocial Re-Evaluation): Psychosocial  Re-Evaluation - 06/29/17 2119      Psychosocial Re-Evaluation   Current issues with  None Identified    Comments  Evan [name] continues to work with his exercise prescription, feeling somewhat stronger, not where he was last time he did the program.      Expected Outcomes  continues to attend class to build stamina and energy    Interventions  Relaxation education;Encouraged to attend Cardiac Rehabilitation for the exercise    Continue Psychosocial Services   Follow up required by staff       Vocational Rehabilitation: Provide vocational rehab assistance to qualifying candidates.   Vocational Rehab Evaluation & Intervention: Vocational Rehab - 05/29/17 1415      Initial Vocational Rehab Evaluation & Intervention   Assessment shows need for Vocational Rehabilitation  No       Education: Education Goals: Education classes will be provided on a variety of topics geared toward better understanding of heart health and risk factor modification. Participant will state understanding/return demonstration of topics presented as noted by education test scores.  Learning Barriers/Preferences: Learning Barriers/Preferences - 05/29/17 1407      Learning Barriers/Preferences   Learning Barriers  Hearing;Sight    Learning Preferences  Individual Instruction;Skilled Demonstration;Video       Education Topics:  AED/CPR: - Group verbal and written instruction with the use of models to demonstrate the basic use of the AED with the basic ABC's of resuscitation.   Cardiac Rehab from 07/11/2017 in [facility] Cardiac and Pulmonary Rehab  Date  06/15/17  Educator  CE  Instruction Review Code  1- Verbalizes Understanding      General Nutrition Guidelines/Fats and Fiber: -Group instruction provided by verbal, written material, models and posters to present the general guidelines for heart healthy nutrition. Gives an explanation and review of dietary fats and fiber.   Cardiac Rehab from 07/11/2017 in [facility]  Cardiac and Pulmonary Rehab  Date  06/13/17  Educator  PI  Instruction Review Code  1- Verbalizes Understanding      Controlling Sodium/Reading Food Labels: -Group verbal and written material supporting the discussion of sodium use in heart healthy nutrition. Review and explanation with models, verbal and written materials for utilization of the food label.   Exercise Physiology & General Exercise Guidelines: - Group verbal and written instruction with models to review the exercise physiology of the cardiovascular system and associated critical values. Provides general exercise guidelines with specific guidelines to those with heart or lung disease.    Cardiac Rehab from 07/11/2017 in [facility] Cardiac and Pulmonary Rehab  Date  06/27/17  Educator  AS  Instruction Review Code  1- Verbalizes Understanding  Aerobic Exercise & Resistance Training: - Gives group verbal and written instruction on the various components of exercise. Focuses on aerobic and resistive training programs and the benefits of this training and how to safely progress through these programs..   Cardiac Rehab from 07/11/2017 in [facility] Cardiac and Pulmonary Rehab  Date  07/11/17  Educator  [facility]  Instruction Review Code  1- Verbalizes Understanding      Flexibility, Balance, Mind/Body Relaxation: Provides group verbal/written instruction on the benefits of flexibility and balance training, including mind/body exercise modes such as yoga, pilates and tai chi.  Demonstration and skill practice provided.   Stress and Anxiety: - Provides group verbal and written instruction about the health risks of elevated stress and causes of high stress.  Discuss the correlation between heart/lung disease and anxiety and treatment options. Review healthy ways to manage with stress and anxiety.   Depression: - Provides group verbal and written instruction on the correlation between heart/lung disease and depressed mood, treatment  options, and the stigmas associated with seeking treatment.   Cardiac Rehab from 07/11/2017 in [facility] Cardiac and Pulmonary Rehab  Date  07/04/17  Educator  [facility]  Instruction Review Code  1- Verbalizes Understanding      Anatomy & Physiology of the Heart: - Group verbal and written instruction and models provide basic cardiac anatomy and physiology, with the coronary electrical and arterial systems. Review of Valvular disease and Heart Failure   Cardiac Procedures: - Group verbal and written instruction to review commonly prescribed medications for heart disease. Reviews the medication, class of the drug, and side effects. Includes the steps to properly store meds and maintain the prescription regimen. (beta blockers and nitrates)   Cardiac Rehab from 07/11/2017 in [facility] Cardiac and Pulmonary Rehab  Date  06/08/17  Educator  CE  Instruction Review Code  1- Verbalizes Understanding      Cardiac Medications I: - Group verbal and written instruction to review commonly prescribed medications for heart disease. Reviews the medication, class of the drug, and side effects. Includes the steps to properly store meds and maintain the prescription regimen.   Cardiac Medications II: -Group verbal and written instruction to review commonly prescribed medications for heart disease. Reviews the medication, class of the drug, and side effects. (all other drug classes)   Cardiac Rehab from 07/11/2017 in [facility] Cardiac and Pulmonary Rehab  Date  07/06/17  Educator  CE  Instruction Review Code  1- Verbalizes Understanding       Go Sex-Intimacy & Heart Disease, Get SMART - Goal Setting: - Group verbal and written instruction through game format to discuss heart disease and the return to sexual intimacy. Provides group verbal and written material to discuss and apply goal setting through the application of the S.M.A.R.T. Method.   Cardiac Rehab from 07/11/2017 in [facility] Cardiac and Pulmonary Rehab  Date   06/08/17  Educator  CE  Instruction Review Code  1- Verbalizes Understanding      Other Matters of the Heart: - Provides group verbal, written materials and models to describe Stable Angina and Peripheral Artery. Includes description of the disease process and treatment options available to the cardiac patient.   Exercise & Equipment Safety: - Individual verbal instruction and demonstration of equipment use and safety with use of the equipment.   Cardiac Rehab from 07/11/2017 in [facility] Cardiac and Pulmonary Rehab  Date  05/29/17  Educator  [facility]  Instruction Review Code  1- Verbalizes Understanding      Infection  Prevention: - Provides verbal and written material to individual with discussion of infection control including proper hand washing and proper equipment cleaning during exercise session.   Cardiac Rehab from 07/11/2017 in [facility] Cardiac and Pulmonary Rehab  Date  05/29/17  Educator  [facility]  Instruction Review Code  1- Verbalizes Understanding      Falls Prevention: - Provides verbal and written material to individual with discussion of falls prevention and safety.   Cardiac Rehab from 07/11/2017 in [facility] Cardiac and Pulmonary Rehab  Date  05/29/17  Educator  [facility]  Instruction Review Code  1- Verbalizes Understanding      Diabetes: - Individual verbal and written instruction to review signs/symptoms of diabetes, desired ranges of glucose level fasting, after meals and with exercise. Acknowledge that pre and post exercise glucose checks will be done for 3 sessions at entry of program.   Cardiac Rehab from 07/11/2017 in [facility] Cardiac and Pulmonary Rehab  Date  05/29/17  Educator  [facility]  Instruction Review Code  1- Verbalizes Understanding      Know Your Numbers and Risk Factors: -Group verbal and written instruction about important numbers in your health.  Discussion of what are risk factors and how they play a role in the disease process.  Review of Cholesterol, Blood Pressure,  Diabetes, and BMI and the role they play in your overall health.   Cardiac Rehab from 07/11/2017 in [facility] Cardiac and Pulmonary Rehab  Date  07/06/17  Educator  CE  Instruction Review Code  1- Verbalizes Understanding      Sleep Hygiene: -Provides group verbal and written instruction about how sleep can affect your health.  Define sleep hygiene, discuss sleep cycles and impact of sleep habits. Review good sleep hygiene tips.    Cardiac Rehab from 07/11/2017 in [facility] Cardiac and Pulmonary Rehab  Date  06/06/17  Educator  [facility]  Instruction Review Code  1- Verbalizes Understanding      Other: -Provides group and verbal instruction on various topics (see comments)   Knowledge Questionnaire Score: Knowledge Questionnaire Score - 05/29/17 1041      Knowledge Questionnaire Score   Pre Score  19/28 results and test reviewed with pt and spouse today       Core Components/Risk Factors/Patient Goals at Admission: Personal Goals and Risk Factors at Admission - 05/29/17 1402      Core Components/Risk Factors/Patient Goals on Admission    Weight Management  Yes;Weight Loss;Obesity    Intervention  Weight Management: Develop a combined nutrition and exercise program designed to reach desired caloric intake, while maintaining appropriate intake of nutrient and fiber, sodium and fats, and appropriate energy expenditure required for the weight goal.;Weight Management: Provide education and appropriate resources to help participant work on and attain dietary goals.;Weight Management/Obesity: Establish reasonable short term and long term weight goals.;Obesity: Provide education and appropriate resources to help participant work on and attain dietary goals.    Admit Weight  259 lb (117.5 kg)    Goal Weight: Short Term  254 lb (115.2 kg)    Goal Weight: Long Term  230 lb (104.3 kg)    Expected Outcomes  Short Term: Continue to assess and modify interventions until short term weight is achieved;Long Term:  Adherence to nutrition and physical activity/exercise program aimed toward attainment of established weight goal;Weight Loss: Understanding of general recommendations for a balanced deficit meal plan, which promotes 1-2 lb weight loss per week and includes a negative energy balance of [phone] kcal/d;Understanding recommendations for meals  to include 15-35% energy as protein, 25-35% energy from fat, 35-60% energy from carbohydrates, less than 245m of dietary cholesterol, 20-35 gm of total fiber daily;Understanding of distribution of calorie intake throughout the day with the consumption of 4-5 meals/snacks    Diabetes  Yes    Intervention  Provide education about signs/symptoms and action to take for hypo/hyperglycemia.;Provide education about proper nutrition, including hydration, and aerobic/resistive exercise prescription along with prescribed medications to achieve blood glucose in normal ranges: Fasting glucose 65-99 mg/dL    Expected Outcomes  Short Term: Participant verbalizes understanding of the signs/symptoms and immediate care of hyper/hypoglycemia, proper foot care and importance of medication, aerobic/resistive exercise and nutrition plan for blood glucose control.;Long Term: Attainment of HbA1C < 7%.    Hypertension  Yes    Intervention  Provide education on lifestyle modifcations including regular physical activity/exercise, weight management, moderate sodium restriction and increased consumption of fresh fruit, vegetables, and low fat dairy, alcohol moderation, and smoking cessation.;Monitor prescription use compliance.    Expected Outcomes  Short Term: Continued assessment and intervention until BP is < 140/94mHG in hypertensive participants. < 130/8037mG in hypertensive participants with diabetes, heart failure or chronic kidney disease.;Long Term: Maintenance of blood pressure at goal levels.    Lipids  Yes    Intervention  Provide education and support for participant on nutrition &  aerobic/resistive exercise along with prescribed medications to achieve LDL <67m60mDL >40mg37m Expected Outcomes  Short Term: Participant states understanding of desired cholesterol values and is compliant with medications prescribed. Participant is following exercise prescription and nutrition guidelines.;Long Term: Cholesterol controlled with medications as prescribed, with individualized exercise RX and with personalized nutrition plan. Value goals: LDL < 67mg,52m > 40 mg.       Core Components/Risk Factors/Patient Goals Review:  Goals and Risk Factor Review    Row Name 06/29/17 0837             Core Components/Risk Factors/Patient Goals Review   Personal Goals Review  Weight Management/Obesity;Lipids;Diabetes;Hypertension       Review  Evan [name] will set appt with the RD. He is interested in making sure he is eating the rights foods to control his risk factors. especially his "sugar".  Last HBG A1c 6.8%.  He states he is compliant with all meds and is attending the program.  He wanted to stop, but he is planning to hang in to the end of the program.        Expected Outcomes  ST and LT: Adherence to medication and following exercise and nutrition guidelines for best risk factors control          Core Components/Risk Factors/Patient Goals at Discharge (Final Review):  Goals and Risk Factor Review - 06/29/17 0837      Core Components/Risk Factors/Patient Goals Review   Personal Goals Review  Weight Management/Obesity;Lipids;Diabetes;Hypertension    Review  Evan [name] will set appt with the RD. He is interested in making sure he is eating the rights foods to control his risk factors. especially his "sugar".  Last HBG A1c 6.8%.  He states he is compliant with all meds and is attending the program.  He wanted to stop, but he is planning to hang in to the end of the program.     Expected Outcomes  ST and LT: Adherence to medication and following exercise and nutrition guidelines for best risk  factors control       ITP Comments: ITP Comments  [place] Name 05/29/17 1358 06/14/17 0558 07/12/17 0605       ITP Comments  Med Review completed. Initial ITP created. Diagnosis can be found in [facility] Encounter 05/09/17  30 day review. Continue with ITP unless directed changes per Medical Director   New to program  30 day review. Continue with ITP unless directed changes per Medical Director review        Comments:

## 2017-07-13 ENCOUNTER — Encounter: Payer: Medicare Other | Admitting: *Deleted

## 2017-07-13 DIAGNOSIS — Z952 Presence of prosthetic heart valve: Secondary | ICD-10-CM | POA: Diagnosis not present

## 2017-07-13 NOTE — Progress Notes (Signed)
Daily Session Note  Patient Details  Name: Evan [name] MRN: [id number] Date of Birth: Aug 14, 1940 Referring Provider:     Cardiac Rehab from 05/29/2017 in [facility] Cardiac and Pulmonary Rehab  Referring Provider  [name], [name] MD      Encounter Date: 07/13/2017  Check In: Session Check In - 07/13/17 0930      Check-In   Location  [facility]-Cardiac & Pulmonary Rehab    Staff Present  Alberteen Sam, MA, RCEP, CCRP, Exercise Physiologist;Amanda Oletta Darter, BA, ACSM CEP, Exercise Physiologist;Carroll Enterkin, RN, BSN    Supervising physician immediately available to respond to emergencies  See telemetry face sheet for immediately available ER MD    Medication changes reported      No    Fall or balance concerns reported     No    Warm-up and Cool-down  Performed on first and last piece of equipment    Resistance Training Performed  Yes    VAD Patient?  No      Pain Assessment   Currently in Pain?  No/denies          Social History   Tobacco Use  Smoking Status Former Smoker  . Packs/day: 2.00  . Years: 39.00  . Pack years: 78.00  . Types: Cigarettes  . Last attempt to quit: 01/24/1994  . Years since quitting: 23.4  Smokeless Tobacco Never Used    Goals Met:  Independence with exercise equipment Exercise tolerated well No report of cardiac concerns or symptoms Strength training completed today  Goals Unmet:  Not Applicable  Comments: Pt able to follow exercise prescription today without complaint.  Will continue to monitor for progression.    Dr. Emily [name] is Medical Director for [place] and [facility].

## 2017-07-17 ENCOUNTER — Encounter: Payer: Self-pay | Admitting: Dietician

## 2017-07-18 DIAGNOSIS — Z952 Presence of prosthetic heart valve: Secondary | ICD-10-CM | POA: Diagnosis not present

## 2017-07-18 NOTE — Progress Notes (Signed)
Daily Session Note  Patient Details  Name: WENDAL WILKIE MRN: [id number] Date of Birth: [birth year] Referring Provider:     Cardiac Rehab from 05/29/2017 in [facility] Cardiac and Pulmonary Rehab  Referring Provider  [name], [name] MD      Encounter Date: 07/18/2017  Check In: Session Check In - 07/18/17 0842      Check-In   Location  [facility]-Cardiac & Pulmonary Rehab    Staff Present  Heath Lark, RN, BSN, CCRP;Jessica Luan Pulling, MA, RCEP, CCRP, Exercise Physiologist;Keerthi Hazell Oletta Darter, [place], ACSM CEP, Exercise Physiologist    Supervising physician immediately available to respond to emergencies  See telemetry face sheet for immediately available ER MD    Medication changes reported      No    Fall or balance concerns reported     No    Warm-up and Cool-down  Performed on first and last piece of equipment    Resistance Training Performed  Yes    VAD Patient?  No    PAD/SET Patient?  No      Pain Assessment   Currently in Pain?  No/denies    Multiple Pain Sites  No          Social History   Tobacco Use  Smoking Status Former Smoker  . Packs/day: 2.00  . Years: 39.00  . Pack years: 78.00  . Types: Cigarettes  . Last attempt to quit: 01/24/1994  . Years since quitting: 23.4  Smokeless Tobacco Never Used    Goals Met:  Independence with exercise equipment Exercise tolerated well No report of cardiac concerns or symptoms Strength training completed today  Goals Unmet:  Not Applicable  Comments: Pt able to follow exercise prescription today without complaint.  Will continue to monitor for progression.    Dr. Emily Filbert is Medical Director for [place] and [facility].

## 2017-07-20 DIAGNOSIS — Z952 Presence of prosthetic heart valve: Secondary | ICD-10-CM

## 2017-07-20 NOTE — Progress Notes (Signed)
Daily Session Note  Patient Details  Name: Evan [name] MRN: [id number] Date of Birth: 09/30/1940 Referring Provider:     Cardiac Rehab from 05/29/2017 in [facility] Cardiac and Pulmonary Rehab  Referring Provider  [name], [name] MD      Encounter Date: 07/20/2017  Check In: Session Check In - 07/20/17 0818      Check-In   Location  [facility]-Cardiac & Pulmonary Rehab    Staff Present  Justin Mend RCP,RRT,BSRT;Carroll Enterkin, RN, Levie Heritage, MA, RCEP, CCRP, Exercise Physiologist    Supervising physician immediately available to respond to emergencies  See telemetry face sheet for immediately available ER MD    Medication changes reported      No    Fall or balance concerns reported     No    Tobacco Cessation  No Change    Warm-up and Cool-down  Performed on first and last piece of equipment    Resistance Training Performed  Yes    VAD Patient?  No    PAD/SET Patient?  No      Pain Assessment   Currently in Pain?  No/denies        Exercise Prescription Changes - 07/19/17 1500      Response to Exercise   Blood Pressure (Admit)  122/70    Blood Pressure (Exercise)  146/82    Blood Pressure (Exit)  122/60    Heart Rate (Admit)  60 bpm    Heart Rate (Exercise)  91 bpm    Heart Rate (Exit)  62 bpm    Rating of Perceived Exertion (Exercise)  13    Symptoms  none    Duration  Continue with 30 min of aerobic exercise without signs/symptoms of physical distress.    Intensity  THRR unchanged      Progression   Progression  Continue to progress workloads to maintain intensity without signs/symptoms of physical distress.    Average METs  1.85      Resistance Training   Training Prescription  Yes    Weight  3 lbs    Reps  10-15      Interval Training   Interval Training  No      Treadmill   MPH  1.5    Grade  0    Minutes  15    METs  2.15      Recumbant Bike   Level  3    Minutes  15    METs  1.6      T5 Nustep   Level  2    Minutes  15    METs  1.8      Home Exercise Plan   Plans to continue exercise at  Home (comment) walking    Frequency  Add 3 additional days to program exercise sessions.    Initial Home Exercises Provided  06/15/17       Social History   Tobacco Use  Smoking Status Former Smoker  . Packs/day: 2.00  . Years: 39.00  . Pack years: 78.00  . Types: Cigarettes  . Last attempt to quit: 01/24/1994  . Years since quitting: 23.5  Smokeless Tobacco Never Used    Goals Met:  Independence with exercise equipment Exercise tolerated well No report of cardiac concerns or symptoms Strength training completed today  Goals Unmet:  Not Applicable  Comments: Pt able to follow exercise prescription today without complaint.  Will continue to monitor for progression. [place] Name 05/29/17  1431 07/20/17 1038       6 Minute Walk   Phase  Initial  Discharge    Distance  980 feet  980 feet    Distance % Change  -  0 %    Distance Feet Change  -  0 ft    Walk Time  6 minutes  6 minutes    # of Rest Breaks  0  0    MPH  1.86  1.59    METS  1.8  1.86    RPE  13  15    Perceived Dyspnea   3  3    VO2 Peak  6.32  5.57    Symptoms  Yes (comment)  Yes (comment)    Comments  hip/leg pain 5/10, SOB  SOB    Resting HR  64 bpm  63 bpm    Resting BP  128/62  130/60    Resting Oxygen Saturation   97 %  -    Exercise Oxygen Saturation  during 6 min walk  96 %  97 %    Max Ex. HR  89 bpm  96 bpm    Max Ex. BP  148/74  142/66    2 Minute Post BP  132/60  -        Dr. Emily Filbert is Medical Director for [place] and [facility].

## 2017-07-25 ENCOUNTER — Encounter: Payer: Medicare Other | Attending: Cardiology | Admitting: *Deleted

## 2017-07-25 DIAGNOSIS — Z87891 Personal history of nicotine dependence: Secondary | ICD-10-CM | POA: Insufficient documentation

## 2017-07-25 DIAGNOSIS — I509 Heart failure, unspecified: Secondary | ICD-10-CM | POA: Insufficient documentation

## 2017-07-25 DIAGNOSIS — E119 Type 2 diabetes mellitus without complications: Secondary | ICD-10-CM | POA: Insufficient documentation

## 2017-07-25 DIAGNOSIS — Z7982 Long term (current) use of aspirin: Secondary | ICD-10-CM | POA: Diagnosis not present

## 2017-07-25 DIAGNOSIS — Z952 Presence of prosthetic heart valve: Secondary | ICD-10-CM | POA: Diagnosis not present

## 2017-07-25 DIAGNOSIS — M1711 Unilateral primary osteoarthritis, right knee: Secondary | ICD-10-CM | POA: Insufficient documentation

## 2017-07-25 DIAGNOSIS — E785 Hyperlipidemia, unspecified: Secondary | ICD-10-CM | POA: Diagnosis not present

## 2017-07-25 DIAGNOSIS — Z8673 Personal history of transient ischemic attack (TIA), and cerebral infarction without residual deficits: Secondary | ICD-10-CM | POA: Diagnosis not present

## 2017-07-25 DIAGNOSIS — I110 Hypertensive heart disease with heart failure: Secondary | ICD-10-CM | POA: Insufficient documentation

## 2017-07-25 DIAGNOSIS — Z79899 Other long term (current) drug therapy: Secondary | ICD-10-CM | POA: Insufficient documentation

## 2017-07-25 NOTE — Progress Notes (Signed)
Daily Session Note  Patient Details  Name: Evan [name] MRN: [id number] Date of Birth: 09-21-1940 Referring Provider:     Cardiac Rehab from 05/29/2017 in [facility] Cardiac and Pulmonary Rehab  Referring Provider  [name], [name] MD      Encounter Date: 07/25/2017  Check In: Session Check In - 07/25/17 0919      Check-In   Location  [facility]-Cardiac & Pulmonary Rehab    Staff Present  Joellyn Rued, BS, PEC;Carroll Enterkin, RN, BSN;Susanne Bice, RN, BSN, CCRP;Adore Kithcart [place], [place], [place], [employer], Exercise Physiologist    Supervising physician immediately available to respond to emergencies  See telemetry face sheet for immediately available ER MD    Medication changes reported      No    Fall or balance concerns reported     No    Warm-up and Cool-down  Performed on first and last piece of equipment    Resistance Training Performed  Yes    VAD Patient?  No    PAD/SET Patient?  No      Pain Assessment   Currently in Pain?  No/denies          Social History   Tobacco Use  Smoking Status Former Smoker  . Packs/day: 2.00  . Years: 39.00  . Pack years: 78.00  . Types: Cigarettes  . Last attempt to quit: 01/24/1994  . Years since quitting: 23.5  Smokeless Tobacco Never Used    Goals Met:  Independence with exercise equipment Exercise tolerated well No report of cardiac concerns or symptoms Strength training completed today  Goals Unmet:  Not Applicable  Comments: Pt able to follow exercise prescription today without complaint.  Will continue to monitor for progression.    Dr. Emily Filbert is Medical Director for [place] and [facility].

## 2017-08-01 DIAGNOSIS — Z952 Presence of prosthetic heart valve: Secondary | ICD-10-CM | POA: Diagnosis not present

## 2017-08-01 NOTE — Progress Notes (Signed)
Daily Session Note  Patient Details  Name: Evan [name] MRN: [id number] Date of Birth: [birth year] Referring Provider:     Cardiac Rehab from 05/29/2017 in [facility] Cardiac and Pulmonary Rehab  Referring Provider  [name], [name] MD      Encounter Date: 08/01/2017  Check In: Session Check In - 08/01/17 0824      Check-In   Location  [facility]-Cardiac & Pulmonary Rehab    Staff Present  Heath Lark, RN, BSN, CCRP;Joseph Hood RCP,RRT,BSRT;Mandi [place], BS, [place];Nada Maclachlan, BA, ACSM CEP, Exercise Physiologist    Supervising physician immediately available to respond to emergencies  See telemetry face sheet for immediately available ER MD    Medication changes reported      No    Fall or balance concerns reported     No    Warm-up and Cool-down  Performed on first and last piece of equipment    Resistance Training Performed  Yes    VAD Patient?  No    PAD/SET Patient?  No      Pain Assessment   Currently in Pain?  No/denies    Multiple Pain Sites  No          Social History   Tobacco Use  Smoking Status Former Smoker  . Packs/day: 2.00  . Years: 39.00  . Pack years: 78.00  . Types: Cigarettes  . Last attempt to quit: 01/24/1994  . Years since quitting: 23.5  Smokeless Tobacco Never Used    Goals Met:  Independence with exercise equipment Exercise tolerated well No report of cardiac concerns or symptoms Strength training completed today  Goals Unmet:  Not Applicable  Comments: Pt able to follow exercise prescription today without complaint.  Will continue to monitor for progression.    Dr. Emily Filbert is Medical Director for [place] and [facility].

## 2017-08-03 DIAGNOSIS — Z952 Presence of prosthetic heart valve: Secondary | ICD-10-CM | POA: Diagnosis not present

## 2017-08-03 NOTE — Progress Notes (Signed)
Daily Session Note  Patient Details  Name: Evan [name] MRN: [id number] Date of Birth: 02-Nov-1940 Referring Provider:     Cardiac Rehab from 05/29/2017 in [facility] Cardiac and Pulmonary Rehab  Referring Provider  [name], [name] MD      Encounter Date: 08/03/2017  Check In: Session Check In - 08/03/17 0942      Check-In   Location  [facility]-Cardiac & Pulmonary Rehab    Staff Present  Constance Goltz, RN BSN;Susanne Bice, RN, BSN, Lance Sell, BA, ACSM CEP, Exercise Physiologist    Supervising physician immediately available to respond to emergencies  See telemetry face sheet for immediately available ER MD    Medication changes reported      No    Fall or balance concerns reported     No    Warm-up and Cool-down  Performed on first and last piece of equipment    Resistance Training Performed  Yes    VAD Patient?  No    PAD/SET Patient?  No      Pain Assessment   Currently in Pain?  No/denies    Multiple Pain Sites  No          Social History   Tobacco Use  Smoking Status Former Smoker  . Packs/day: 2.00  . Years: 39.00  . Pack years: 78.00  . Types: Cigarettes  . Last attempt to quit: 01/24/1994  . Years since quitting: 23.5  Smokeless Tobacco Never Used    Goals Met:  Independence with exercise equipment Exercise tolerated well No report of cardiac concerns or symptoms Strength training completed today  Goals Unmet:  Not Applicable  Comments: Pt able to follow exercise prescription today without complaint.  Will continue to monitor for progression.    Dr. Emily Filbert is Medical Director for [place] and [facility].

## 2017-08-08 DIAGNOSIS — Z952 Presence of prosthetic heart valve: Secondary | ICD-10-CM

## 2017-08-08 LAB — GLUCOSE, CAPILLARY: Glucose-Capillary: 156 mg/dL — ABNORMAL HIGH (ref 70–99)

## 2017-08-08 NOTE — Progress Notes (Signed)
Daily Session Note  Patient Details  Name: Evan [name] MRN: [id number] Date of Birth: [birth year] Referring Provider:     Cardiac Rehab from 05/29/2017 in [facility]  Referring Provider  [name], [name] MD      Encounter Date: 08/08/2017  Check In: Session Check In - 08/08/17 0847      Check-In   Location  [facility]-Cardiac [facility] Pulmonary Rehab    Staff Present  Evan Lark, RN, BSN, CCRP;Evan [place], MA, RCEP, CCRP, Exercise Physiologist;Evan [name], BS, PEC;Evan [name], BA, ACSM CEP, Exercise Physiologist    Supervising physician immediately available to respond to emergencies  See telemetry face sheet for immediately available ER MD    Medication changes reported      No    Fall or balance concerns reported     No    Warm-up and Cool-down  Performed on first and last piece of equipment    Resistance Training Performed  Yes    VAD Patient?  No    PAD/SET Patient?  No      Pain Assessment   Currently in Pain?  No/denies    Multiple Pain Sites  No          Social History   Tobacco Use  Smoking Status Former Smoker  . Packs/day: 2.00  . Years: 39.00  . Pack years: 78.00  . Types: Cigarettes  . Last attempt to quit: 01/24/1994  . Years since quitting: 23.5  Smokeless Tobacco Never Used    Goals Met:  Independence with exercise equipment Exercise tolerated well No report of cardiac concerns or symptoms Strength training completed today  Goals Unmet:  Not Applicable  Comments:  Evan [name] graduated today from  rehab with 36 sessions completed.  Details of the patient's exercise prescription and what He needs to do in order to continue the prescription and progress were discussed with patient.  Patient was given a copy of prescription and goals.  Patient verbalized understanding.  Evan [name] plans to continue to exercise by walking.    Dr. Emily [name] is Medical Director for [facility].

## 2017-08-08 NOTE — Patient Instructions (Signed)
Discharge Patient Instructions  Patient Details  Name: Evan [name] MRN: [id number] Date of Birth: 05-May-1940 Referring Provider:  Leonel Ramsay, MD   Number of Visits: 36  Reason for Discharge:  Patient reached a stable level of exercise. Patient independent in their exercise. Patient has met program and personal goals.  Smoking History:  Social History   Tobacco Use  Smoking Status Former Smoker  . Packs/day: 2.00  . Years: 39.00  . Pack years: 78.00  . Types: Cigarettes  . Last attempt to quit: 01/24/1994  . Years since quitting: 23.5  Smokeless Tobacco Never Used    Diagnosis:  No diagnosis found.  Initial Exercise Prescription: Initial Exercise Prescription - 05/29/17 1400      Date of Initial Exercise RX and Referring Provider   Date  05/29/17    Referring Provider  [name], [name] MD      Treadmill   MPH  1.1    Grade  0    Minutes  15    METs  1.84      Recumbant Bike   Level  1    RPM  40    Minutes  15    METs  1.8      T5 Nustep   Level  1    SPM  80    Minutes  15    METs  1.8      Prescription Details   Frequency (times per week)  2    Duration  Progress to 30 minutes of continuous aerobic without signs/symptoms of physical distress      Intensity   THRR 40-80% of Max Heartrate  98-131    Ratings of Perceived Exertion  11-13    Perceived Dyspnea  0-4      Progression   Progression  Continue to progress workloads to maintain intensity without signs/symptoms of physical distress.      Resistance Training   Training Prescription  Yes    Weight  3 lbs    Reps  10-15       Discharge Exercise Prescription (Final Exercise Prescription Changes): Exercise Prescription Changes - 08/01/17 1300      Response to Exercise   Blood Pressure (Admit)  132/62    Blood Pressure (Exercise)  150/68    Blood Pressure (Exit)  138/60    Heart Rate (Admit)  80 bpm    Heart Rate (Exercise)  72 bpm    Heart Rate (Exit)  69 bpm    Rating of  Perceived Exertion (Exercise)  12    Symptoms  none    Duration  Continue with 30 min of aerobic exercise without signs/symptoms of physical distress.    Intensity  THRR unchanged      Progression   Progression  Continue to progress workloads to maintain intensity without signs/symptoms of physical distress.    Average METs  1.8      Resistance Training   Training Prescription  Yes    Weight  3 lb    Reps  10-15      Interval Training   Interval Training  No      T5 Nustep   Level  2    Minutes  30    METs  1.8      Home Exercise Plan   Plans to continue exercise at  Home (comment) walking    Frequency  Add 3 additional days to program exercise sessions.       Functional Capacity: 6  Minute Walk    Row Name 05/29/17 1431 07/20/17 1038       6 Minute Walk   Phase  Initial  Discharge    Distance  980 feet  980 feet    Distance % Change  -  0 %    Distance Feet Change  -  0 ft    Walk Time  6 minutes  6 minutes    # of Rest Breaks  0  0    MPH  1.86  1.59    METS  1.8  1.86    RPE  13  15    Perceived Dyspnea   3  3    VO2 Peak  6.32  5.57    Symptoms  Yes (comment)  Yes (comment)    Comments  hip/leg pain 5/10, SOB  SOB    Resting HR  64 bpm  63 bpm    Resting BP  128/62  130/60    Resting Oxygen Saturation   97 %  -    Exercise Oxygen Saturation  during 6 min walk  96 %  97 %    Max Ex. HR  89 bpm  96 bpm    Max Ex. BP  148/74  142/66    2 Minute Post BP  132/60  -       Quality of Life: Quality of Life - 08/03/17 1044      Quality of Life   Select  Quality of Life      Quality of Life Scores   Health/Function Pre  22.65 %    Health/Function Post  30 %    Health/Function % Change  32.45 %    Socioeconomic Pre  27.83 %    Socioeconomic Post  28.13 %    Socioeconomic % Change   1.08 %    Psych/Spiritual Pre  30 %    Psych/Spiritual Post  28.57 %    Psych/Spiritual % Change  -4.77 %    Family Pre  28.5 %    Family Post  30 %    Family % Change   5.26 %    GLOBAL Pre  26.18 %    GLOBAL Post  29.29 %    GLOBAL % Change  11.88 %       Personal Goals: Goals established at orientation with interventions provided to work toward goal. Personal Goals and Risk Factors at Admission - 05/29/17 1402      Core Components/Risk Factors/Patient Goals on Admission    Weight Management  Yes;Weight Loss;Obesity    Intervention  Weight Management: Develop a combined nutrition and exercise program designed to reach desired caloric intake, while maintaining appropriate intake of nutrient and fiber, sodium and fats, and appropriate energy expenditure required for the weight goal.;Weight Management: Provide education and appropriate resources to help participant work on and attain dietary goals.;Weight Management/Obesity: Establish reasonable short term and long term weight goals.;Obesity: Provide education and appropriate resources to help participant work on and attain dietary goals.    Admit Weight  259 lb (117.5 kg)    Goal Weight: Short Term  254 lb (115.2 kg)    Goal Weight: Long Term  230 lb (104.3 kg)    Expected Outcomes  Short Term: Continue to assess and modify interventions until short term weight is achieved;Long Term: Adherence to nutrition and physical activity/exercise program aimed toward attainment of established weight goal;Weight Loss: Understanding of general recommendations for a balanced deficit meal plan, which  promotes 1-2 lb weight loss per week and includes a negative energy balance of [phone] kcal/d;Understanding recommendations for meals to include 15-35% energy as protein, 25-35% energy from fat, 35-60% energy from carbohydrates, less than 278m of dietary cholesterol, 20-35 gm of total fiber daily;Understanding of distribution of calorie intake throughout the day with the consumption of 4-5 meals/snacks    Diabetes  Yes    Intervention  Provide education about signs/symptoms and action to take for hypo/hyperglycemia.;Provide  education about proper nutrition, including hydration, and aerobic/resistive exercise prescription along with prescribed medications to achieve blood glucose in normal ranges: Fasting glucose 65-99 mg/dL    Expected Outcomes  Short Term: Participant verbalizes understanding of the signs/symptoms and immediate care of hyper/hypoglycemia, proper foot care and importance of medication, aerobic/resistive exercise and nutrition plan for blood glucose control.;Long Term: Attainment of HbA1C < 7%.    Hypertension  Yes    Intervention  Provide education on lifestyle modifcations including regular physical activity/exercise, weight management, moderate sodium restriction and increased consumption of fresh fruit, vegetables, and low fat dairy, alcohol moderation, and smoking cessation.;Monitor prescription use compliance.    Expected Outcomes  Short Term: Continued assessment and intervention until BP is < 140/972mHG in hypertensive participants. < 130/8046mG in hypertensive participants with diabetes, heart failure or chronic kidney disease.;Long Term: Maintenance of blood pressure at goal levels.    Lipids  Yes    Intervention  Provide education and support for participant on nutrition & aerobic/resistive exercise along with prescribed medications to achieve LDL <44m90mDL >40mg6m Expected Outcomes  Short Term: Participant states understanding of desired cholesterol values and is compliant with medications prescribed. Participant is following exercise prescription and nutrition guidelines.;Long Term: Cholesterol controlled with medications as prescribed, with individualized exercise RX and with personalized nutrition plan. Value goals: LDL < 44mg,75m > 40 mg.        Personal Goals Discharge: Goals and Risk Factor Review - 06/29/17 0837      Core Components/Risk Factors/Patient Goals Review   Personal Goals Review  Weight Management/Obesity;Lipids;Diabetes;Hypertension    Review  Eann will set appt  with the RD. He is interested in making sure he is eating the rights foods to control his risk factors. especially his "sugar".  Last HBG A1c 6.8%.  He states he is compliant with all meds and is attending the program.  He wanted to stop, but he is planning to hang in to the end of the program.     Expected Outcomes  ST and LT: Adherence to medication and following exercise and nutrition guidelines for best risk factors control       Exercise Goals and Review: Exercise Goals    Row Name 05/29/17 1443             Exercise Goals   Increase Physical Activity  Yes       Intervention  Provide advice, education, support and counseling about physical activity/exercise needs.;Develop an individualized exercise prescription for aerobic and resistive training based on initial evaluation findings, risk stratification, comorbidities and participant's personal goals.       Expected Outcomes  Short Term: Attend rehab on a regular basis to increase amount of physical activity.;Long Term: Exercising regularly at least 3-5 days a week.;Long Term: Add in home exercise to make exercise part of routine and to increase amount of physical activity.       Increase Strength and Stamina  Yes       Intervention  Provide  advice, education, support and counseling about physical activity/exercise needs.;Develop an individualized exercise prescription for aerobic and resistive training based on initial evaluation findings, risk stratification, comorbidities and participant's personal goals.       Expected Outcomes  Short Term: Increase workloads from initial exercise prescription for resistance, speed, and METs.;Short Term: Perform resistance training exercises routinely during rehab and add in resistance training at home;Long Term: Improve cardiorespiratory fitness, muscular endurance and strength as measured by increased METs and functional capacity (6MWT)       Able to understand and use rate of perceived exertion (RPE)  scale  Yes       Intervention  Provide education and explanation on how to use RPE scale       Expected Outcomes  Short Term: Able to use RPE daily in rehab to express subjective intensity level;Long Term:  Able to use RPE to guide intensity level when exercising independently       Knowledge and understanding of Target Heart Rate Range (THRR)  Yes       Intervention  Provide education and explanation of THRR including how the numbers were predicted and where they are located for reference       Expected Outcomes  Short Term: Able to state/look up THRR;Short Term: Able to use daily as guideline for intensity in rehab;Long Term: Able to use THRR to govern intensity when exercising independently       Able to check pulse independently  Yes       Intervention  Provide education and demonstration on how to check pulse in carotid and radial arteries.;Review the importance of being able to check your own pulse for safety during independent exercise       Expected Outcomes  Short Term: Able to explain why pulse checking is important during independent exercise;Long Term: Able to check pulse independently and accurately       Understanding of Exercise Prescription  Yes       Intervention  Provide education, explanation, and written materials on patient's individual exercise prescription       Expected Outcomes  Short Term: Able to explain program exercise prescription;Long Term: Able to explain home exercise prescription to exercise independently          Nutrition & Weight - Outcomes: Pre Biometrics - 05/29/17 1514      Pre Biometrics   Height  6' (1.829 m)    Weight  259 lb (117.5 kg)    Waist Circumference  45.25 inches    Hip Circumference  46 inches    Waist to Hip Ratio  0.98 %    BMI (Calculated)  35.12    Single Leg Stand  2.51 seconds        Nutrition: Nutrition Therapy & Goals - 07/17/17 0910      Nutrition Therapy   RD appointment deferred  Yes       Nutrition  Discharge: Nutrition Assessments - 08/03/17 1042      MEDFICTS Scores   Pre Score  91    Post Score  69    Score Difference  -22       Education Questionnaire Score: Knowledge Questionnaire Score - 08/03/17 1043      Knowledge Questionnaire Score   Pre Score  19/28    Post Score  20/28 reviewed with patient       Goals reviewed with patient; copy given to patient.

## 2017-08-08 NOTE — Progress Notes (Signed)
Discharge Progress Report  Patient Details  Name: Evan [name] MRN: [id number] Date of Birth: 1940-02-12 Referring Provider:     Cardiac Rehab from 05/29/2017 in [facility]  Referring Provider  [name], [name] MD       Number of Visits: 36  Reason for Discharge:  Patient reached a stable level of exercise. Patient independent in their exercise. Patient has met program and personal goals.  Smoking History:  Social History   Tobacco Use  Smoking Status Former Smoker  . Packs/day: 2.00  . Years: 39.00  . Pack years: 78.00  . Types: Cigarettes  . Last attempt to quit: 01/24/1994  . Years since quitting: 23.5  Smokeless Tobacco Never Used    Diagnosis:  S/P TAVR (transcatheter aortic valve replacement)  ADL UCSD:   Initial Exercise Prescription: Initial Exercise Prescription - 05/29/17 1400      Date of Initial Exercise RX and Referring Provider   Date  05/29/17    Referring Provider  [name], [name] MD      Treadmill   MPH  1.1    Grade  0    Minutes  15    METs  1.84      Recumbant Bike   Level  1    RPM  40    Minutes  15    METs  1.8      T5 Nustep   Level  1    SPM  80    Minutes  15    METs  1.8      Prescription Details   Frequency (times per week)  2    Duration  Progress to 30 minutes of continuous aerobic without signs/symptoms of physical distress      Intensity   THRR 40-80% of Max Heartrate  98-131    Ratings of Perceived Exertion  11-13    Perceived Dyspnea  0-4      Progression   Progression  Continue to progress workloads to maintain intensity without signs/symptoms of physical distress.      Resistance Training   Training Prescription  Yes    Weight  3 lbs    Reps  10-15       Discharge Exercise Prescription (Final Exercise Prescription Changes): Exercise Prescription Changes - 08/01/17 1300      Response to Exercise   Blood Pressure (Admit)  132/62    Blood Pressure (Exercise)  150/68    Blood  Pressure (Exit)  138/60    Heart Rate (Admit)  80 bpm    Heart Rate (Exercise)  72 bpm    Heart Rate (Exit)  69 bpm    Rating of Perceived Exertion (Exercise)  12    Symptoms  none    Duration  Continue with 30 min of aerobic exercise without signs/symptoms of physical distress.    Intensity  THRR unchanged      Progression   Progression  Continue to progress workloads to maintain intensity without signs/symptoms of physical distress.    Average METs  1.8      Resistance Training   Training Prescription  Yes    Weight  3 lb    Reps  10-15      Interval Training   Interval Training  No      T5 Nustep   Level  2    Minutes  30    METs  1.8      Home Exercise Plan   Plans to continue exercise  at  Home (comment) walking    Frequency  Add 3 additional days to program exercise sessions.       Functional Capacity: 6 Minute Walk    Row Name 05/29/17 1431 07/20/17 1038       6 Minute Walk   Phase  Initial  Discharge    Distance  980 feet  980 feet    Distance % Change  -  0 %    Distance Feet Change  -  0 ft    Walk Time  6 minutes  6 minutes    # of Rest Breaks  0  0    MPH  1.86  1.59    METS  1.8  1.86    RPE  13  15    Perceived Dyspnea   3  3    VO2 Peak  6.32  5.57    Symptoms  Yes (comment)  Yes (comment)    Comments  hip/leg pain 5/10, SOB  SOB    Resting HR  64 bpm  63 bpm    Resting BP  128/62  130/60    Resting Oxygen Saturation   97 %  -    Exercise Oxygen Saturation  during 6 min walk  96 %  97 %    Max Ex. HR  89 bpm  96 bpm    Max Ex. BP  148/74  142/66    2 Minute Post BP  132/60  -       Psychological, QOL, Others - Outcomes: PHQ 2/9: Depression screen [facility] 2/9 08/03/2017 05/29/2017  Decreased Interest 1 1  Down, Depressed, Hopeless 0 0  PHQ - 2 Score 1 1  Altered sleeping 0 2  Tired, decreased energy 2 1  Change in appetite 2 0  Feeling bad or failure about yourself  0 0  Trouble concentrating 0 2  Moving slowly or fidgety/restless 0 0   Suicidal thoughts 0 0  PHQ-9 Score 5 6  Difficult doing work/chores Not difficult at all Somewhat difficult    Quality of Life: Quality of Life - 08/03/17 1044      Quality of Life   Select  Quality of Life      Quality of Life Scores   Health/Function Pre  22.65 %    Health/Function Post  30 %    Health/Function % Change  32.45 %    Socioeconomic Pre  27.83 %    Socioeconomic Post  28.13 %    Socioeconomic % Change   1.08 %    Psych/Spiritual Pre  30 %    Psych/Spiritual Post  28.57 %    Psych/Spiritual % Change  -4.77 %    Family Pre  28.5 %    Family Post  30 %    Family % Change  5.26 %    GLOBAL Pre  26.18 %    GLOBAL Post  29.29 %    GLOBAL % Change  11.88 %       Personal Goals: Goals established at orientation with interventions provided to work toward goal. Personal Goals and Risk Factors at Admission - 05/29/17 1402      Core Components/Risk Factors/Patient Goals on Admission    Weight Management  Yes;Weight Loss;Obesity    Intervention  Weight Management: Develop a combined nutrition and exercise program designed to reach desired caloric intake, while maintaining appropriate intake of nutrient and fiber, sodium and fats, and appropriate energy expenditure required for the weight goal.;Weight  Management: Provide education and appropriate resources to help participant work on and attain dietary goals.;Weight Management/Obesity: Establish reasonable short term and long term weight goals.;Obesity: Provide education and appropriate resources to help participant work on and attain dietary goals.    Admit Weight  259 lb (117.5 kg)    Goal Weight: Short Term  254 lb (115.2 kg)    Goal Weight: Long Term  230 lb (104.3 kg)    Expected Outcomes  Short Term: Continue to assess and modify interventions until short term weight is achieved;Long Term: Adherence to nutrition and physical activity/exercise program aimed toward attainment of established weight goal;Weight Loss:  Understanding of general recommendations for a balanced deficit meal plan, which promotes 1-2 lb weight loss per week and includes a negative energy balance of [phone] kcal/d;Understanding recommendations for meals to include 15-35% energy as protein, 25-35% energy from fat, 35-60% energy from carbohydrates, less than '200mg'$  of dietary cholesterol, 20-35 gm of total fiber daily;Understanding of distribution of calorie intake throughout the day with the consumption of 4-5 meals/snacks    Diabetes  Yes    Intervention  Provide education about signs/symptoms and action to take for hypo/hyperglycemia.;Provide education about proper nutrition, including hydration, and aerobic/resistive exercise prescription along with prescribed medications to achieve blood glucose in normal ranges: Fasting glucose 65-99 mg/dL    Expected Outcomes  Short Term: Participant verbalizes understanding of the signs/symptoms and immediate care of hyper/hypoglycemia, proper foot care and importance of medication, aerobic/resistive exercise and nutrition plan for blood glucose control.;Long Term: Attainment of HbA1C < 7%.    Hypertension  Yes    Intervention  Provide education on lifestyle modifcations including regular physical activity/exercise, weight management, moderate sodium restriction and increased consumption of fresh fruit, vegetables, and low fat dairy, alcohol moderation, and smoking cessation.;Monitor prescription use compliance.    Expected Outcomes  Short Term: Continued assessment and intervention until BP is < 140/73m HG in hypertensive participants. < 130/837mHG in hypertensive participants with diabetes, heart failure or chronic kidney disease.;Long Term: Maintenance of blood pressure at goal levels.    Lipids  Yes    Intervention  Provide education and support for participant on nutrition & aerobic/resistive exercise along with prescribed medications to achieve LDL '70mg'$ , HDL >'40mg'$ .    Expected Outcomes  Short  Term: Participant states understanding of desired cholesterol values and is compliant with medications prescribed. Participant is following exercise prescription and nutrition guidelines.;Long Term: Cholesterol controlled with medications as prescribed, with individualized exercise RX and with personalized nutrition plan. Value goals: LDL < '70mg'$ , HDL > 40 mg.        Personal Goals Discharge: Goals and Risk Factor Review    Row Name 06/29/17 0837             Core Components/Risk Factors/Patient Goals Review   Personal Goals Review  Weight Management/Obesity;Lipids;Diabetes;Hypertension       Review  Dietrich will set appt with the RD. He is interested in making sure he is eating the rights foods to control his risk factors. especially his "sugar".  Last HBG A1c 6.8%.  He states he is compliant with all meds and is attending the program.  He wanted to stop, but he is planning to hang in to the end of the program.        Expected Outcomes  ST and LT: Adherence to medication and following exercise and nutrition guidelines for best risk factors control          Exercise Goals and Review:  Exercise Goals    Row Name 05/29/17 1443             Exercise Goals   Increase Physical Activity  Yes       Intervention  Provide advice, education, support and counseling about physical activity/exercise needs.;Develop an individualized exercise prescription for aerobic and resistive training based on initial evaluation findings, risk stratification, comorbidities and participant's personal goals.       Expected Outcomes  Short Term: Attend rehab on a regular basis to increase amount of physical activity.;Long Term: Exercising regularly at least 3-5 days a week.;Long Term: Add in home exercise to make exercise part of routine and to increase amount of physical activity.       Increase Strength and Stamina  Yes       Intervention  Provide advice, education, support and counseling about physical  activity/exercise needs.;Develop an individualized exercise prescription for aerobic and resistive training based on initial evaluation findings, risk stratification, comorbidities and participant's personal goals.       Expected Outcomes  Short Term: Increase workloads from initial exercise prescription for resistance, speed, and METs.;Short Term: Perform resistance training exercises routinely during rehab and add in resistance training at home;Long Term: Improve cardiorespiratory fitness, muscular endurance and strength as measured by increased METs and functional capacity (6MWT)       Able to understand and use rate of perceived exertion (RPE) scale  Yes       Intervention  Provide education and explanation on how to use RPE scale       Expected Outcomes  Short Term: Able to use RPE daily in rehab to express subjective intensity level;Long Term:  Able to use RPE to guide intensity level when exercising independently       Knowledge and understanding of Target Heart Rate Range (THRR)  Yes       Intervention  Provide education and explanation of THRR including how the numbers were predicted and where they are located for reference       Expected Outcomes  Short Term: Able to state/look up THRR;Short Term: Able to use daily as guideline for intensity in rehab;Long Term: Able to use THRR to govern intensity when exercising independently       Able to check pulse independently  Yes       Intervention  Provide education and demonstration on how to check pulse in carotid and radial arteries.;Review the importance of being able to check your own pulse for safety during independent exercise       Expected Outcomes  Short Term: Able to explain why pulse checking is important during independent exercise;Long Term: Able to check pulse independently and accurately       Understanding of Exercise Prescription  Yes       Intervention  Provide education, explanation, and written materials on patient's individual  exercise prescription       Expected Outcomes  Short Term: Able to explain program exercise prescription;Long Term: Able to explain home exercise prescription to exercise independently          Nutrition & Weight - Outcomes: Pre Biometrics - 05/29/17 1514      Pre Biometrics   Height  6' (1.829 m)    Weight  259 lb (117.5 kg)    Waist Circumference  45.25 inches    Hip Circumference  46 inches    Waist to Hip Ratio  0.98 %    BMI (Calculated)  35.12    Single Leg Stand  2.51 seconds        Nutrition: Nutrition Therapy & Goals - 07/17/17 0910      Nutrition Therapy   RD appointment deferred  Yes       Nutrition Discharge: Nutrition Assessments - 08/03/17 1042      MEDFICTS Scores   Pre Score  91    Post Score  69    Score Difference  -22       Education Questionnaire Score: Knowledge Questionnaire Score - 08/03/17 1043      Knowledge Questionnaire Score   Pre Score  19/28    Post Score  20/28 reviewed with patient       Goals reviewed with patient; copy given to patient.

## 2017-08-08 NOTE — Progress Notes (Signed)
Cardiac Individual Treatment Plan  Patient Details  Name: Evan [name] MRN: [id number] Date of Birth: 01/25/1940 Referring Provider:     Cardiac Rehab from 05/29/2017 in [facility] Cardiac and Pulmonary Rehab  Referring Provider  [name], [name] MD      Initial Encounter Date:    Cardiac Rehab from 05/29/2017 in [facility] Cardiac and Pulmonary Rehab  Date  05/29/17      Visit Diagnosis: S/P TAVR (transcatheter aortic valve replacement)  Patient's Home Medications on Admission:  Current Outpatient Medications:  .  acetaminophen (TYLENOL) 650 MG CR tablet, Take 1,300 mg by mouth every 8 (eight) hours as needed for pain. , Disp: , Rfl:  .  amLODipine (NORVASC) 5 MG tablet, Take 1 tablet (5 mg total) by mouth daily., Disp: 90 tablet, Rfl: 3 .  apixaban (ELIQUIS) 5 MG TABS tablet, Take 1 tablet (5 mg total) by mouth 2 (two) times daily., Disp: 28 tablet, Rfl: 0 .  aspirin EC 81 MG EC tablet, Take 1 tablet (81 mg total) by mouth daily., Disp: , Rfl:  .  benazepril (LOTENSIN) 40 MG tablet, Take 1 tablet (40 mg total) by mouth daily., Disp: 90 tablet, Rfl: 3 .  Camphor-Menthol-Methyl Sal (MUSCLE RUB ULTRA STRENGTH) 05-03-28 % CREA, Apply 1 application topically 3 (three) times daily as needed (muscle pain)., Disp: , Rfl:  .  Cyanocobalamin (RA VITAMIN B-12 TR) 1000 MCG TBCR, Take 1,000 mcg by mouth daily. , Disp: , Rfl:  .  ezetimibe (ZETIA) 10 MG tablet, Take 1 tablet (10 mg total) by mouth daily. (Patient taking differently: Take 10 mg by mouth every evening. ), Disp: 30 tablet, Rfl: 11 .  fluticasone (FLONASE) 50 MCG/ACT nasal spray, Place 2 sprays into both nostrils daily., Disp: , Rfl:  .  glimepiride (AMARYL) 2 MG tablet, Take 2 mg by mouth daily with breakfast., Disp: , Rfl:  .  guaiFENesin-codeine 100-10 MG/5ML syrup, Take 5 mLs by mouth every 6 (six) hours as needed for cough. , Disp: , Rfl:  .  hydrochlorothiazide (HYDRODIURIL) 25 MG tablet, Take 1 tablet (25 mg total) by mouth every morning.,  Disp: 90 tablet, Rfl: 1 .  OVER THE COUNTER MEDICATION, Replenex tablets: Take 3 tablet by mouth every morning (joint health), Disp: , Rfl:  .  Polyvinyl Alcohol-Povidone (REFRESH OP), Place 2 drops into both eyes daily as needed (for dry eyes). , Disp: , Rfl:  .  rosuvastatin (CRESTOR) 20 MG tablet, Take 1 tablet (20 mg total) by mouth daily. (Patient taking differently: Take 20 mg by mouth every evening. ), Disp: 30 tablet, Rfl: 0 .  tamsulosin (FLOMAX) 0.4 MG CAPS capsule, TAKE 1 CAPSULE BY MOUTH ONCE DAILY (Patient taking differently: TAKE 0.4 MG BY MOUTH ONCE DAILY IN THE EVENING), Disp: 90 capsule, Rfl: 3  Past Medical History: Past Medical History:  Diagnosis Date  . Anemia   . Carotid arterial disease ([place])   . CHF (congestive heart failure) ([place])   . Coronary artery disease   . HOH (hard of hearing)   . Hyperlipidemia   . Hypertension   . Nocturia   . Obesity   . Osteoarthritis    "BUE; BLE; right knee" (03/23/2017)  . Peripheral vision loss, bilateral    S/P 07/2015  . Permanent atrial fibrillation ([place])   . 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 Severe aortic stenosis   . Sinus drainage   . Stroke [facility]) 07/2015   "peripheral vision still bad out of left eye" (03/23/2017)  .  Torn rotator cuff 2012   Right arm  . Type II diabetes mellitus ([place])   . Urinary frequency     Tobacco Use: Social History   Tobacco Use  Smoking Status Former Smoker  . Packs/day: 2.00  . Years: 39.00  . Pack years: 78.00  . Types: Cigarettes  . Last attempt to quit: 01/24/1994  . Years since quitting: 23.5  Smokeless Tobacco Never Used    Labs: Recent Chemical engineer    Labs for ITP Cardiac and Pulmonary Rehab Latest Ref Rng & Units 05/05/2017 05/09/2017 05/09/2017 05/09/2017 05/09/2017   Cholestrol 100 - 199 mg/dL - - - - -   LDLCALC 0 - 99 mg/dL - - - - -   HDL >39 mg/dL - - - - -   Trlycerides 0 - 149 mg/dL - - - - -   Hemoglobin A1c 4.8 - 5.6 % 6.8(H) - - - -    PHART 7.350 - 7.450 7.445 - 7.310(L) - 7.337(L)   PCO2ART 32.0 - 48.0 mmHg 34.1 - 52.0(H) - 46.0   HCO3 20.0 - 28.0 mmol/L 23.1 - 26.1 - 24.7   TCO2 22 - 32 mmol/L - _0 ACIDBASEDEF 0.0 - 2.0 mmol/L 0.5 - 1.0 - 1.0   O2SAT % 98.3 - 99.0 - 99.0       Exercise Target Goals:    Exercise Program Goal: Individual exercise prescription set using results from initial 6 min walk test and THRR while considering  patient's activity barriers and safety.   Exercise Prescription Goal: Initial exercise prescription builds to 30-45 minutes a day of aerobic activity, 2-3 days per week.  Home exercise guidelines will be given to patient during program as part of exercise prescription that the participant will acknowledge.  Activity Barriers & Risk Stratification: Activity Barriers & Cardiac Risk Stratification - 05/29/17 1416      Activity Barriers & Cardiac Risk Stratification   Activity Barriers  Joint Problems;Muscular Weakness;Shortness of Breath;Other (comment);Deconditioning;Back Problems;Arthritis;Balance Concerns    Comments  both arms- issues lifting and griping , arthritis in both knees (left side a little worse)    Cardiac Risk Stratification  Moderate       6 Minute Walk: 6 Minute Walk    Row Name 05/29/17 1431 07/20/17 1038       6 Minute Walk   Phase  Initial  Discharge    Distance  980 feet  980 feet    Distance % Change  -  0 %    Distance Feet Change  -  0 ft    Walk Time  6 minutes  6 minutes    # of Rest Breaks  0  0    MPH  1.86  1.59    METS  1.8  1.86    RPE  13  15    Perceived Dyspnea   3  3    VO2 Peak  6.32  5.57    Symptoms  Yes (comment)  Yes (comment)    Comments  hip/leg pain 5/10, SOB  SOB    Resting HR  64 bpm  63 bpm    Resting BP  128/62  130/60    Resting Oxygen Saturation   97 %  -    Exercise Oxygen Saturation  during 6 min walk  96 %  97 %    Max Ex. HR  89 bpm  96 bpm    Max Ex. BP  148/74  142/66    2 Minute Post BP  132/60  -        Oxygen Initial Assessment:   Oxygen Re-Evaluation:   Oxygen Discharge (Final Oxygen Re-Evaluation):   Initial Exercise Prescription: Initial Exercise Prescription - 05/29/17 1400      Date of Initial Exercise RX and Referring Provider   Date  05/29/17    Referring Provider  [name], [name] MD      Treadmill   MPH  1.1    Grade  0    Minutes  15    METs  1.84      Recumbant Bike   Level  1    RPM  40    Minutes  15    METs  1.8      T5 Nustep   Level  1    SPM  80    Minutes  15    METs  1.8      Prescription Details   Frequency (times per week)  2    Duration  Progress to 30 minutes of continuous aerobic without signs/symptoms of physical distress      Intensity   THRR 40-80% of Max Heartrate  98-131    Ratings of Perceived Exertion  11-13    Perceived Dyspnea  0-4      Progression   Progression  Continue to progress workloads to maintain intensity without signs/symptoms of physical distress.      Resistance Training   Training Prescription  Yes    Weight  3 lbs    Reps  10-15       Perform Capillary Blood Glucose checks as needed.  Exercise Prescription Changes: Exercise Prescription Changes    Row Name 05/29/17 1400 06/06/17 1200 06/15/17 1000 06/20/17 1400 07/04/17 1400     Response to Exercise   Blood Pressure (Admit)  128/62  128/78  -  168/72  132/74   Blood Pressure (Exercise)  148/74  168/80  -  132/70  132/70   Blood Pressure (Exit)  132/60  124/70  -  136/66  124/74   Heart Rate (Admit)  64 bpm  75 bpm  -  63 bpm  59 bpm   Heart Rate (Exercise)  89 bpm  96 bpm  -  89 bpm  83 bpm   Heart Rate (Exit)  53 bpm  67 bpm  -  62 bpm  68 bpm   Oxygen Saturation (Admit)  97 %  -  -  -  -   Oxygen Saturation (Exercise)  96 %  -  -  -  -   Rating of Perceived Exertion (Exercise)  13  15  -  15  10   Perceived Dyspnea (Exercise)  3  -  -  -  -   Symptoms  hip/leg pain 5/10, SOB  none  -  none  none   Comments  walk test results  second full day  of exercise  -  -  -   Duration  -  Continue with 30 min of aerobic exercise without signs/symptoms of physical distress.  -  Continue with 30 min of aerobic exercise without signs/symptoms of physical distress.  Continue with 30 min of aerobic exercise without signs/symptoms of physical distress.   Intensity  -  THRR unchanged  -  THRR unchanged  THRR unchanged     Progression   Progression  -  Continue to progress workloads to maintain intensity without signs/symptoms of physical distress.  -  Continue to progress workloads to maintain intensity without signs/symptoms of physical distress.  Continue to progress workloads to maintain intensity without signs/symptoms of physical distress.   Average METs  -  1.82  -  1.83  1.88     Resistance Training   Training Prescription  -  Yes  -  Yes  Yes   Weight  -  3 lbs  -  3 lbs  3 lbs   Reps  -  10-15  -  10-15  10-15     Interval Training   Interval Training  -  No  -  No  No     Treadmill   MPH  -  1.1  -  1  1.5   Grade  -  0  -  0  0   Minutes  -  15  -  1  15   METs  -  1.84  -  1.7  2.15     Recumbant Bike   Level  -  -  -  1  3   Minutes  -  -  -  15  15   METs  -  -  -  2  1.7     T5 Nustep   Level  -  1  -  2  2   Minutes  -  15  -  15  15   METs  -  1.8  -  1.8  1.8     Home Exercise Plan   Plans to continue exercise at  -  -  Home (comment) walking  Home (comment) walking  Home (comment) walking   Frequency  -  -  Add 3 additional days to program exercise sessions.  Add 3 additional days to program exercise sessions.  Add 3 additional days to program exercise sessions.   Initial Home Exercises Provided  -  -  06/15/17  06/15/17  06/15/17   Row Name 07/19/17 1500 08/01/17 1300           Response to Exercise   Blood Pressure (Admit)  122/70  132/62      Blood Pressure (Exercise)  146/82  150/68      Blood Pressure (Exit)  122/60  138/60      Heart Rate (Admit)  60 bpm  80 bpm      Heart Rate (Exercise)  91 bpm  72  bpm      Heart Rate (Exit)  62 bpm  69 bpm      Rating of Perceived Exertion (Exercise)  13  12      Symptoms  none  none      Duration  Continue with 30 min of aerobic exercise without signs/symptoms of physical distress.  Continue with 30 min of aerobic exercise without signs/symptoms of physical distress.      Intensity  THRR unchanged  THRR unchanged        Progression   Progression  Continue to progress workloads to maintain intensity without signs/symptoms of physical distress.  Continue to progress workloads to maintain intensity without signs/symptoms of physical distress.      Average METs  1.85  1.8        Resistance Training   Training Prescription  Yes  Yes      Weight  3 lbs  3 lb      Reps  10-15  10-15        Interval Training   Interval Training  No  No        Treadmill   MPH  1.5  -      Grade  0  -      Minutes  15  -      METs  2.15  -        Recumbant Bike   Level  3  -      Minutes  15  -      METs  1.6  -        T5 Nustep   Level  2  2      Minutes  15  30      METs  1.8  1.8        Home Exercise Plan   Plans to continue exercise at  Home (comment) walking  Home (comment) walking      Frequency  Add 3 additional days to program exercise sessions.  Add 3 additional days to program exercise sessions.      Initial Home Exercises Provided  06/15/17  -         Exercise Comments: Exercise Comments    Row Name 06/01/17 2620 08/08/17 0859         Exercise Comments  First full day of exercise!  Patient was oriented to gym and equipment including functions, settings, policies, and procedures.  Patient's individual exercise prescription and treatment plan were reviewed.  All starting workloads were established based on the results of the 6 minute walk test done at initial orientation visit.  The plan for exercise progression was also introduced and progression will be customized based on patient's performance and goals.  Evan [name] graduated today from  rehab  with 36 sessions completed.  Details of the patient's exercise prescription and what He needs to do in order to continue the prescription and progress were discussed with patient.  Patient was given a copy of prescription and goals.  Patient verbalized understanding.  Evan [name] plans to continue to exercise by walking.         Exercise Goals and Review: Exercise Goals    Row Name 05/29/17 1443             Exercise Goals   Increase Physical Activity  Yes       Intervention  Provide advice, education, support and counseling about physical activity/exercise needs.;Develop an individualized exercise prescription for aerobic and resistive training based on initial evaluation findings, risk stratification, comorbidities and participant's personal goals.       Expected Outcomes  Short Term: Attend rehab on a regular basis to increase amount of physical activity.;Long Term: Exercising regularly at least 3-5 days a week.;Long Term: Add in home exercise to make exercise part of routine and to increase amount of physical activity.       Increase Strength and Stamina  Yes       Intervention  Provide advice, education, support and counseling about physical activity/exercise needs.;Develop an individualized exercise prescription for aerobic and resistive training based on initial evaluation findings, risk stratification, comorbidities and participant's personal goals.       Expected Outcomes  Short Term: Increase workloads from initial exercise prescription for resistance, speed, and METs.;Short Term: Perform resistance training exercises routinely during rehab and add in resistance training at home;Long Term: Improve cardiorespiratory fitness, muscular endurance and strength as measured by increased METs and functional capacity (6MWT)       Able to understand and use rate of perceived exertion (RPE) scale  Yes  Intervention  Provide education and explanation on how to use RPE scale       Expected Outcomes   Short Term: Able to use RPE daily in rehab to express subjective intensity level;Long Term:  Able to use RPE to guide intensity level when exercising independently       Knowledge and understanding of Target Heart Rate Range (THRR)  Yes       Intervention  Provide education and explanation of THRR including how the numbers were predicted and where they are located for reference       Expected Outcomes  Short Term: Able to state/look up THRR;Short Term: Able to use daily as guideline for intensity in rehab;Long Term: Able to use THRR to govern intensity when exercising independently       Able to check pulse independently  Yes       Intervention  Provide education and demonstration on how to check pulse in carotid and radial arteries.;Review the importance of being able to check your own pulse for safety during independent exercise       Expected Outcomes  Short Term: Able to explain why pulse checking is important during independent exercise;Long Term: Able to check pulse independently and accurately       Understanding of Exercise Prescription  Yes       Intervention  Provide education, explanation, and written materials on patient's individual exercise prescription       Expected Outcomes  Short Term: Able to explain program exercise prescription;Long Term: Able to explain home exercise prescription to exercise independently          Exercise Goals Re-Evaluation : Exercise Goals Re-Evaluation    Row Name 06/01/17 7416 06/06/17 1134 06/15/17 1004 06/20/17 1437 07/04/17 1429     Exercise Goal Re-Evaluation   Exercise Goals Review  Knowledge and understanding of Target Heart Rate Range (THRR);Able to understand and use rate of perceived exertion (RPE) scale;Understanding of Exercise Prescription;Increase Physical Activity  Increase Physical Activity;Increase Strength and Stamina;Understanding of Exercise Prescription  Increase Physical Activity;Able to understand and use rate of perceived exertion  (RPE) scale;Knowledge and understanding of Target Heart Rate Range (THRR);Understanding of Exercise Prescription  Increase Physical Activity;Understanding of Exercise Prescription;Increase Strength and Stamina  Increase Physical Activity;Understanding of Exercise Prescription;Increase Strength and Stamina   Comments  Reviewed RPE scale, THR and program prescription with pt today.  Pt voiced understanding and was given a copy of goals to take home.   Evan [name] is off to a good start in rehab. He has completed two full days of exercise.  We will continue to monitor his progression.   Reviewed home exercise with pt today.  [place]  Reviewed THR, pulse, RPE, sign and symptoms, NTG use, and when to call 911 or MD.  Also discussed weather considerations and indoor options.  Pt voiced understanding.  Reviewed home exercise with pt today.  [place]  Reviewed THR, pulse, RPE, sign and symptoms, NTG use, and when to call 911 or MD.  Also discussed weather considerations and indoor options.  Pt voiced understanding.  Evan [name] has been doing well in rehab.  He is still doing just above baseline workloads and they are finally starting to get easier for him.  We will begin to move up his workloads more.  Today, we increase his recumbent bike to level 3. We will continue to monitor his progress.  Expected Outcomes  Short: Use RPE daily to regulate intensity.  Long: Follow program prescription in THR.  Short: Continue to attend regularly.  Long: Continue to follow program prescription.   Short: Start to add in at least 30 min of walking on off days.  Long: Continue to increase physical activity.   Short: Continue to add walking in on off days.  Long: Continue to increase strength and stamina.   Short: Increase more workloads.  Long: Continue to work on increasing home exercise.    Evan [name] Name 07/19/17 1531 08/01/17 1320           Exercise Goal  Re-Evaluation   Exercise Goals Review  Increase Physical Activity;Understanding of Exercise Prescription;Increase Strength and Stamina  Increase Physical Activity;Able to understand and use rate of perceived exertion (RPE) scale;Increase Strength and Stamina      Comments  Evan [name] continues to do well in rehab. He has not had any more workload increases as everything is a 13 again.  We will continue to move up more workloads.  We will continue to monitor his progresion.   Pt has chronic knee pain that interferes with exercise.        Expected Outcomes  Short: Continue to increase workloads.  Long: Continue to add in exercise at home.   Short - complete HT Long - maintain exercise on his own         Discharge Exercise Prescription (Final Exercise Prescription Changes): Exercise Prescription Changes - 08/01/17 1300      Response to Exercise   Blood Pressure (Admit)  132/62    Blood Pressure (Exercise)  150/68    Blood Pressure (Exit)  138/60    Heart Rate (Admit)  80 bpm    Heart Rate (Exercise)  72 bpm    Heart Rate (Exit)  69 bpm    Rating of Perceived Exertion (Exercise)  12    Symptoms  none    Duration  Continue with 30 min of aerobic exercise without signs/symptoms of physical distress.    Intensity  THRR unchanged      Progression   Progression  Continue to progress workloads to maintain intensity without signs/symptoms of physical distress.    Average METs  1.8      Resistance Training   Training Prescription  Yes    Weight  3 lb    Reps  10-15      Interval Training   Interval Training  No      T5 Nustep   Level  2    Minutes  30    METs  1.8      Home Exercise Plan   Plans to continue exercise at  Home (comment) walking    Frequency  Add 3 additional days to program exercise sessions.       Nutrition:  Target Goals: Understanding of nutrition guidelines, daily intake of sodium <1543m, cholesterol <208m calories 30% from fat and 7% or less from saturated fats,  daily to have 5 or more servings of fruits and vegetables.  Biometrics: Pre Biometrics - 05/29/17 1514      Pre Biometrics   Height  6' (1.829 m)    Weight  259 lb (117.5 kg)    Waist Circumference  45.25 inches    Hip Circumference  46 inches    Waist to Hip Ratio  0.98 %    BMI (Calculated)  35.12    Single Leg Stand  2.51 seconds  Nutrition Therapy Plan and Nutrition Goals: Nutrition Therapy & Goals - 07/17/17 0910      Nutrition Therapy   RD appointment deferred  Yes       Nutrition Assessments: Nutrition Assessments - 08/03/17 1042      MEDFICTS Scores   Pre Score  91    Post Score  69    Score Difference  -22       Nutrition Goals Re-Evaluation:   Nutrition Goals Discharge (Final Nutrition Goals Re-Evaluation):   Psychosocial: Target Goals: Acknowledge presence or absence of significant depression and/or stress, maximize coping skills, provide positive support system. Participant is able to verbalize types and ability to use techniques and skills needed for reducing stress and depression.   Initial Review & Psychosocial Screening: Initial Psych Review & Screening - 05/29/17 1406      Initial Review   Current issues with  Current Sleep Concerns;Current Stress Concerns    Source of Stress Concerns  Unable to perform yard/household activities;Unable to participate in former interests or hobbies      [place]?  Yes spouse      Barriers   Psychosocial barriers to participate in program  There are no identifiable barriers or psychosocial needs.;The patient should benefit from training in stress management and relaxation.      Screening Interventions   Interventions  Encouraged to exercise;Provide feedback about the scores to participant;Program counselor consult;To provide support and resources with identified psychosocial needs    Expected Outcomes  Short Term goal: Utilizing psychosocial counselor, staff and physician to  assist with identification of specific Stressors or current issues interfering with healing process. Setting desired goal for each stressor or current issue identified.;Long Term Goal: Stressors or current issues are controlled or eliminated.;Short Term goal: Identification and review with participant of any Quality of Life or Depression concerns found by scoring the questionnaire.;Long Term goal: The participant improves quality of Life and PHQ9 Scores as seen by post scores and/or verbalization of changes       Quality of Life Scores:  Quality of Life - 08/03/17 1044      Quality of Life   Select  Quality of Life      Quality of Life Scores   Health/Function Pre  22.65 %    Health/Function Post  30 %    Health/Function % Change  32.45 %    Socioeconomic Pre  27.83 %    Socioeconomic Post  28.13 %    Socioeconomic % Change   1.08 %    Psych/Spiritual Pre  30 %    Psych/Spiritual Post  28.57 %    Psych/Spiritual % Change  -4.77 %    Family Pre  28.5 %    Family Post  30 %    Family % Change  5.26 %    GLOBAL Pre  26.18 %    GLOBAL Post  29.29 %    GLOBAL % Change  11.88 %      Scores of 19 and below usually indicate a poorer quality of life in these areas.  A difference of  2-3 points is a clinically meaningful difference.  A difference of 2-3 points in the total score of the Quality of Life Index has been associated with significant improvement in overall quality of life, self-image, physical symptoms, and general health in studies assessing change in quality of life.  PHQ-9: Recent Review Flowsheet Data    Depression screen [facility] 2/9 08/03/2017 05/29/2017  Decreased Interest 1 1   Down, Depressed, Hopeless 0 0   PHQ - 2 Score 1 1   Altered sleeping 0 2   Tired, decreased energy 2 1   Change in appetite 2 0   Feeling bad or failure about yourself  0 0   Trouble concentrating 0 2   Moving slowly or fidgety/restless 0 0   Suicidal thoughts 0 0   PHQ-9 Score 5 6   Difficult  doing work/chores Not difficult at all Somewhat difficult     Interpretation of Total Score  Total Score Depression Severity:  1-4 = Minimal depression, 5-9 = Mild depression, 10-14 = Moderate depression, 15-19 = Moderately severe depression, 20-27 = Severe depression   Psychosocial Evaluation and Intervention: Psychosocial Evaluation - 06/29/17 0828      Psychosocial Evaluation & Interventions   Interventions  Encouraged to exercise with the program and follow exercise prescription       Psychosocial Re-Evaluation: Psychosocial Re-Evaluation    Evan [name] Name 06/29/17 8101             Psychosocial Re-Evaluation   Current issues with  None Identified       Comments  Evan [name] continues to work with his exercise prescription, feeling somewhat stronger, not where he was last time he did the program.         Expected Outcomes  continues to attend class to build stamina and energy       Interventions  Relaxation education;Encouraged to attend Cardiac Rehabilitation for the exercise       Continue Psychosocial Services   Follow up required by staff          Psychosocial Discharge (Final Psychosocial Re-Evaluation): Psychosocial Re-Evaluation - 06/29/17 7510      Psychosocial Re-Evaluation   Current issues with  None Identified    Comments  Evan [name] continues to work with his exercise prescription, feeling somewhat stronger, not where he was last time he did the program.      Expected Outcomes  continues to attend class to build stamina and energy    Interventions  Relaxation education;Encouraged to attend Cardiac Rehabilitation for the exercise    Continue Psychosocial Services   Follow up required by staff       Vocational Rehabilitation: Provide vocational rehab assistance to qualifying candidates.   Vocational Rehab Evaluation & Intervention: Vocational Rehab - 05/29/17 1415      Initial Vocational Rehab Evaluation & Intervention   Assessment shows need for Vocational  Rehabilitation  No       Education: Education Goals: Education classes will be provided on a variety of topics geared toward better understanding of heart health and risk factor modification. Participant will state understanding/return demonstration of topics presented as noted by education test scores.  Learning Barriers/Preferences: Learning Barriers/Preferences - 05/29/17 1407      Learning Barriers/Preferences   Learning Barriers  Hearing;Sight    Learning Preferences  Individual Instruction;Skilled Demonstration;Video       Education Topics:  AED/CPR: - Group verbal and written instruction with the use of models to demonstrate the basic use of the AED with the basic ABC's of resuscitation.   Cardiac Rehab from 08/08/2017 in [facility] Cardiac and Pulmonary Rehab  Date  06/15/17  Educator  CE  Instruction Review Code  1- Verbalizes Understanding      General Nutrition Guidelines/Fats and Fiber: -Group instruction provided by verbal, written material, models and posters to present the general guidelines for heart healthy nutrition. Gives an explanation  and review of dietary fats and fiber.   Cardiac Rehab from 08/08/2017 in [facility] Cardiac and Pulmonary Rehab  Date  08/08/17  Educator  CR  Instruction Review Code  1- Verbalizes Understanding      Controlling Sodium/Reading Food Labels: -Group verbal and written material supporting the discussion of sodium use in heart healthy nutrition. Review and explanation with models, verbal and written materials for utilization of the food label.   Exercise Physiology & General Exercise Guidelines: - Group verbal and written instruction with models to review the exercise physiology of the cardiovascular system and associated critical values. Provides general exercise guidelines with specific guidelines to those with heart or lung disease.    Cardiac Rehab from 08/08/2017 in [facility] Cardiac and Pulmonary Rehab  Date  06/27/17  Educator  AS   Instruction Review Code  1- Verbalizes Understanding      Aerobic Exercise & Resistance Training: - Gives group verbal and written instruction on the various components of exercise. Focuses on aerobic and resistive training programs and the benefits of this training and how to safely progress through these programs..   Cardiac Rehab from 08/08/2017 in [facility] Cardiac and Pulmonary Rehab  Date  07/11/17  Educator  [facility]  Instruction Review Code  1- Verbalizes Understanding      Flexibility, Balance, Mind/Body Relaxation: Provides group verbal/written instruction on the benefits of flexibility and balance training, including mind/body exercise modes such as yoga, pilates and tai chi.  Demonstration and skill practice provided.   Cardiac Rehab from 08/08/2017 in [facility] Cardiac and Pulmonary Rehab  Date  07/13/17  Educator  AS  Instruction Review Code  1- Verbalizes Understanding      Stress and Anxiety: - Provides group verbal and written instruction about the health risks of elevated stress and causes of high stress.  Discuss the correlation between heart/lung disease and anxiety and treatment options. Review healthy ways to manage with stress and anxiety.   Cardiac Rehab from 08/08/2017 in [facility] Cardiac and Pulmonary Rehab  Date  08/01/17  Educator  [facility]  Instruction Review Code  1- Verbalizes Understanding      Depression: - Provides group verbal and written instruction on the correlation between heart/lung disease and depressed mood, treatment options, and the stigmas associated with seeking treatment.   Cardiac Rehab from 08/08/2017 in [facility] Cardiac and Pulmonary Rehab  Date  07/04/17  Educator  [facility]  Instruction Review Code  1- Verbalizes Understanding      Anatomy & Physiology of the Heart: - Group verbal and written instruction and models provide basic cardiac anatomy and physiology, with the coronary electrical and arterial systems. Review of Valvular disease and Heart Failure    Cardiac Rehab from 08/08/2017 in [facility] Cardiac and Pulmonary Rehab  Date  08/03/17  Educator  SB  Instruction Review Code  1- Verbalizes Understanding      Cardiac Procedures: - Group verbal and written instruction to review commonly prescribed medications for heart disease. Reviews the medication, class of the drug, and side effects. Includes the steps to properly store meds and maintain the prescription regimen. (beta blockers and nitrates)   Cardiac Rehab from 08/08/2017 in [facility] Cardiac and Pulmonary Rehab  Date  07/25/17  Educator  CE  Instruction Review Code  1- Verbalizes Understanding      Cardiac Medications I: - Group verbal and written instruction to review commonly prescribed medications for heart disease. Reviews the medication, class of the drug, and side effects. Includes the steps to properly  store meds and maintain the prescription regimen.   Cardiac Rehab from 08/08/2017 in [facility] Cardiac and Pulmonary Rehab  Date  07/18/17  Educator  SB  Instruction Review Code  1- Verbalizes Understanding      Cardiac Medications II: -Group verbal and written instruction to review commonly prescribed medications for heart disease. Reviews the medication, class of the drug, and side effects. (all other drug classes)   Cardiac Rehab from 08/08/2017 in [facility] Cardiac and Pulmonary Rehab  Date  07/06/17  Educator  CE  Instruction Review Code  1- Verbalizes Understanding       Go Sex-Intimacy & Heart Disease, Get SMART - Goal Setting: - Group verbal and written instruction through game format to discuss heart disease and the return to sexual intimacy. Provides group verbal and written material to discuss and apply goal setting through the application of the S.M.A.R.T. Method.   Cardiac Rehab from 08/08/2017 in [facility] Cardiac and Pulmonary Rehab  Date  07/25/17  Educator  CE  Instruction Review Code  1- Verbalizes Understanding      Other Matters of the Heart: - Provides group verbal,  written materials and models to describe Stable Angina and Peripheral Artery. Includes description of the disease process and treatment options available to the cardiac patient.   Cardiac Rehab from 08/08/2017 in [facility] Cardiac and Pulmonary Rehab  Date  08/03/17  Educator  SB  Instruction Review Code  1- Verbalizes Understanding      Exercise & Equipment Safety: - Individual verbal instruction and demonstration of equipment use and safety with use of the equipment.   Cardiac Rehab from 08/08/2017 in [facility] Cardiac and Pulmonary Rehab  Date  05/29/17  Educator  [facility]  Instruction Review Code  1- Verbalizes Understanding      Infection Prevention: - Provides verbal and written material to individual with discussion of infection control including proper hand washing and proper equipment cleaning during exercise session.   Cardiac Rehab from 08/08/2017 in [facility] Cardiac and Pulmonary Rehab  Date  05/29/17  Educator  [facility]  Instruction Review Code  1- Verbalizes Understanding      Falls Prevention: - Provides verbal and written material to individual with discussion of falls prevention and safety.   Cardiac Rehab from 08/08/2017 in [facility] Cardiac and Pulmonary Rehab  Date  05/29/17  Educator  [facility]  Instruction Review Code  1- Verbalizes Understanding      Diabetes: - Individual verbal and written instruction to review signs/symptoms of diabetes, desired ranges of glucose level fasting, after meals and with exercise. Acknowledge that pre and post exercise glucose checks will be done for 3 sessions at entry of program.   Cardiac Rehab from 08/08/2017 in [facility] Cardiac and Pulmonary Rehab  Date  05/29/17  Educator  [facility]  Instruction Review Code  1- Verbalizes Understanding      Know Your Numbers and Risk Factors: -Group verbal and written instruction about important numbers in your health.  Discussion of what are risk factors and how they play a role in the disease process.  Review of Cholesterol, Blood  Pressure, Diabetes, and BMI and the role they play in your overall health.   Cardiac Rehab from 08/08/2017 in [facility] Cardiac and Pulmonary Rehab  Date  07/06/17  Educator  CE  Instruction Review Code  1- Verbalizes Understanding      Sleep Hygiene: -Provides group verbal and written instruction about how sleep can affect your health.  Define sleep hygiene, discuss sleep cycles and impact of  sleep habits. Review good sleep hygiene tips.    Cardiac Rehab from 08/08/2017 in [facility] Cardiac and Pulmonary Rehab  Date  06/06/17  Educator  [facility]  Instruction Review Code  1- Verbalizes Understanding      Other: -Provides group and verbal instruction on various topics (see comments)   Knowledge Questionnaire Score: Knowledge Questionnaire Score - 08/03/17 1043      Knowledge Questionnaire Score   Pre Score  19/28    Post Score  20/28 reviewed with patient       Core Components/Risk Factors/Patient Goals at Admission: Personal Goals and Risk Factors at Admission - 05/29/17 1402      Core Components/Risk Factors/Patient Goals on Admission    Weight Management  Yes;Weight Loss;Obesity    Intervention  Weight Management: Develop a combined nutrition and exercise program designed to reach desired caloric intake, while maintaining appropriate intake of nutrient and fiber, sodium and fats, and appropriate energy expenditure required for the weight goal.;Weight Management: Provide education and appropriate resources to help participant work on and attain dietary goals.;Weight Management/Obesity: Establish reasonable short term and long term weight goals.;Obesity: Provide education and appropriate resources to help participant work on and attain dietary goals.    Admit Weight  259 lb (117.5 kg)    Goal Weight: Short Term  254 lb (115.2 kg)    Goal Weight: Long Term  230 lb (104.3 kg)    Expected Outcomes  Short Term: Continue to assess and modify interventions until short term weight is achieved;Long  Term: Adherence to nutrition and physical activity/exercise program aimed toward attainment of established weight goal;Weight Loss: Understanding of general recommendations for a balanced deficit meal plan, which promotes 1-2 lb weight loss per week and includes a negative energy balance of [id number] kcal/d;Understanding recommendations for meals to include 15-35% energy as protein, 25-35% energy from fat, 35-60% energy from carbohydrates, less than 282m of dietary cholesterol, 20-35 gm of total fiber daily;Understanding of distribution of calorie intake throughout the day with the consumption of 4-5 meals/snacks    Diabetes  Yes    Intervention  Provide education about signs/symptoms and action to take for hypo/hyperglycemia.;Provide education about proper nutrition, including hydration, and aerobic/resistive exercise prescription along with prescribed medications to achieve blood glucose in normal ranges: Fasting glucose 65-99 mg/dL    Expected Outcomes  Short Term: Participant verbalizes understanding of the signs/symptoms and immediate care of hyper/hypoglycemia, proper foot care and importance of medication, aerobic/resistive exercise and nutrition plan for blood glucose control.;Long Term: Attainment of HbA1C < 7%.    Hypertension  Yes    Intervention  Provide education on lifestyle modifcations including regular physical activity/exercise, weight management, moderate sodium restriction and increased consumption of fresh fruit, vegetables, and low fat dairy, alcohol moderation, and smoking cessation.;Monitor prescription use compliance.    Expected Outcomes  Short Term: Continued assessment and intervention until BP is < 140/910mHG in hypertensive participants. < 130/8037mG in hypertensive participants with diabetes, heart failure or chronic kidney disease.;Long Term: Maintenance of blood pressure at goal levels.    Lipids  Yes    Intervention  Provide education and support for participant on  nutrition & aerobic/resistive exercise along with prescribed medications to achieve LDL <58m58mDL >40mg52m Expected Outcomes  Short Term: Participant states understanding of desired cholesterol values and is compliant with medications prescribed. Participant is following exercise prescription and nutrition guidelines.;Long Term: Cholesterol controlled with medications as prescribed, with individualized exercise RX and with personalized nutrition plan.  Value goals: LDL < 66m, HDL > 40 mg.       Core Components/Risk Factors/Patient Goals Review:  Goals and Risk Factor Review    Row Name 06/29/17 0837             Core Components/Risk Factors/Patient Goals Review   Personal Goals Review  Weight Management/Obesity;Lipids;Diabetes;Hypertension       Review  Evan [name] will set appt with the RD. He is interested in making sure he is eating the rights foods to control his risk factors. especially his "sugar".  Last HBG A1c 6.8%.  He states he is compliant with all meds and is attending the program.  He wanted to stop, but he is planning to hang in to the end of the program.        Expected Outcomes  ST and LT: Adherence to medication and following exercise and nutrition guidelines for best risk factors control          Core Components/Risk Factors/Patient Goals at Discharge (Final Review):  Goals and Risk Factor Review - 06/29/17 0837      Core Components/Risk Factors/Patient Goals Review   Personal Goals Review  Weight Management/Obesity;Lipids;Diabetes;Hypertension    Review  Evan [name] will set appt with the RD. He is interested in making sure he is eating the rights foods to control his risk factors. especially his "sugar".  Last HBG A1c 6.8%.  He states he is compliant with all meds and is attending the program.  He wanted to stop, but he is planning to hang in to the end of the program.     Expected Outcomes  ST and LT: Adherence to medication and following exercise and nutrition guidelines for  best risk factors control       ITP Comments: ITP Comments    Row Name 05/29/17 1358 06/14/17 0558 07/12/17 0605 08/08/17 0858     ITP Comments  Med Review completed. Initial ITP created. [facility]  30 day review. Continue with ITP unless directed changes per Medical Director   New to program  30 day review. Continue with ITP unless directed changes per Medical Director review  Discharge ITP sent and signed by Dr. MSabra Heck  Discharge Summary routed to PCP and cardiologist.       Comments: Discharge ITP

## 2017-09-17 NOTE — Progress Notes (Deleted)
Cardiology Office Note    Date:  09/17/2017   ID:  Steele, Stracener 05/19/1940, MRN [id number]  PCP:  Leonel Ramsay, MD  Cardiologist: Dr. [name] Electrophysiologist Dr. Curt Bears PV specialist: Dr. Burt Knack  No chief complaint on file.   History of Present Illness:  Evan [name] is a 77 y.o. male seen for follow up CAD and prior TAVR. He has a  past medical history of CAD s/p CABG 2005, hypertension, hyperlipidemia, carotid artery disease, permanent atrial fibrillation, severe aortic stenosis s/p TAVR, DM 2, and pacemaker.  Cardiac catheterization in 2007 demonstrated patent grafts.  Myoview in 2013 showed no significant abnormality.  He had left carotid enterectomy in May 2014.  He is also intolerant to statins due to severe myalgia and weakness.  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  Sleep study in October 2017 only showed borderline sleep apnea with minimal desaturation.  Patient was last seen by Dr. [name] on 02/07/2017, he was doing well at the time.  However follow-up echocardiogram obtained on 03/01/2017 showed the patient's aortic stenosis has significantly progressed since 2017.  Dr. Curt Bears recommended permanent pacemaker placement due to severe conduction disease and this was placed at the end of February. He eventually had St Jude MRI compatible dual-chamber pacemaker implanted on 03/23/2017.  He was evaluated by Dr. Burt Knack for TAVR and eventually underwent the procedure with Dr. Cyndia Bent and Dr. Burt Knack with placement of Edwards Sapien 3 THV size 26 mm bioprosthetic valve.  He had no complications. Follow up Echo in May showed a good result and pacemaker was functioning well at that time.    Past Medical History:  Diagnosis Date  . Anemia   . Carotid arterial disease ([place])   . CHF (congestive heart failure) ([place])     . Coronary artery disease   . HOH (hard of hearing)   . Hyperlipidemia   . Hypertension   . Nocturia   . Obesity   . Osteoarthritis    "BUE; BLE; right knee" (03/23/2017)  . Peripheral vision loss, bilateral    S/P 07/2015  . Permanent atrial fibrillation ([place])   . 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 Severe aortic stenosis   . Sinus drainage   . Stroke [facility]) 07/2015   "peripheral vision still bad out of left eye" (03/23/2017)  . Torn rotator cuff 2012   Right arm  . Type II diabetes mellitus ([place])   . Urinary frequency     Past Surgical History:  Procedure Laterality Date  . BACK SURGERY    . CARDIAC CATHETERIZATION  11/29/2005   SEVERE THREE VESSEL OBSTRUCTIVE ATHERSCLEROTIC CAD. ALL GRAFTS WERE PATENT. EF 65%  . CARDIAC CATHETERIZATION  03/23/2017  . CAROTID ENDARTERECTOMY Right 01-29-08   cea  . CARPAL TUNNEL RELEASE Right   . CORONARY ARTERY BYPASS GRAFT  03/2003   X3. LIMA GRAFT TO THE LAD, SAPHENOUS VEIN GRAFT TO THE PA, AND A LEFT RADIAL GRAFT TO THEOBTUSE MARGINAL VESSEL  . ENDARTERECTOMY Left 05/28/2012   Procedure: ENDARTERECTOMY CAROTID;  Surgeon: Elam Dutch, MD;  Location: [facility];  Service: Vascular;  Laterality: Left;  . INSERT / REPLACE / REMOVE PACEMAKER  03/23/2017  . [place]; 1984  . MOLE SURGERY     "? face/arms"  . PACEMAKER IMPLANT N/A 03/23/2017  Procedure: PACEMAKER IMPLANT;  Surgeon: Constance Haw, MD;  Location: [facility];  Service: Cardiovascular;  Laterality: N/A;  . PATCH ANGIOPLASTY Left 05/28/2012   Procedure: WITH dACRON PATCH ANGIOPLASTY;  Surgeon: Elam Dutch, MD;  Location: [facility];  Service: Vascular;  Laterality: Left;  . RIGHT/LEFT HEART CATH AND CORONARY/GRAFT ANGIOGRAPHY N/A 03/23/2017   Procedure: RIGHT/LEFT HEART CATH AND CORONARY/GRAFT ANGIOGRAPHY;  Surgeon: Sherren Mocha, MD;  Location: [facility];  Service: Cardiovascular;  Laterality: N/A;  . TEE WITHOUT CARDIOVERSION  N/A 05/09/2017   Procedure: TRANSESOPHAGEAL ECHOCARDIOGRAM (TEE);  Surgeon: Sherren Mocha, MD;  Location: [facility];  Service: Open Heart Surgery;  Laterality: N/A;  . TRANSCATHETER AORTIC VALVE REPLACEMENT, TRANSFEMORAL N/A 05/09/2017   Procedure: TRANSCATHETER AORTIC VALVE REPLACEMENT, TRANSFEMORAL using an Edwards 33mm Sapien 3 Aortic Valve;  Surgeon: Sherren Mocha, MD;  Location: [facility];  Service: Open Heart Surgery;  Laterality: N/A;    Current Medications: Outpatient Medications Prior to Visit  Medication Sig Dispense Refill  . acetaminophen (TYLENOL) 650 MG CR tablet Take 1,300 mg by mouth every 8 (eight) hours as needed for pain.     Marland Kitchen amLODipine (NORVASC) 5 MG tablet Take 1 tablet (5 mg total) by mouth daily. 90 tablet 3  . apixaban (ELIQUIS) 5 MG TABS tablet Take 1 tablet (5 mg total) by mouth 2 (two) times daily. 28 tablet 0  . aspirin EC 81 MG EC tablet Take 1 tablet (81 mg total) by mouth daily.    . benazepril (LOTENSIN) 40 MG tablet Take 1 tablet (40 mg total) by mouth daily. 90 tablet 3  . Camphor-Menthol-Methyl Sal (MUSCLE RUB ULTRA STRENGTH) 05-03-28 % CREA Apply 1 application topically 3 (three) times daily as needed (muscle pain).    . Cyanocobalamin (RA VITAMIN B-12 TR) 1000 MCG TBCR Take 1,000 mcg by mouth daily.     Marland Kitchen ezetimibe (ZETIA) 10 MG tablet Take 1 tablet (10 mg total) by mouth daily. (Patient taking differently: Take 10 mg by mouth every evening. ) 30 tablet 11  . fluticasone (FLONASE) 50 MCG/ACT nasal spray Place 2 sprays into both nostrils daily.    Marland Kitchen glimepiride (AMARYL) 2 MG tablet Take 2 mg by mouth daily with breakfast.    . guaiFENesin-codeine 100-10 MG/5ML syrup Take 5 mLs by mouth every 6 (six) hours as needed for cough.     . hydrochlorothiazide (HYDRODIURIL) 25 MG tablet Take 1 tablet (25 mg total) by mouth every morning. 90 tablet 1  . OVER THE COUNTER MEDICATION Replenex tablets: Take 3 tablet by mouth every morning (joint health)    . Polyvinyl  Alcohol-Povidone (REFRESH OP) Place 2 drops into both eyes daily as needed (for dry eyes).     . rosuvastatin (CRESTOR) 20 MG tablet Take 1 tablet (20 mg total) by mouth daily. (Patient taking differently: Take 20 mg by mouth every evening. ) 30 tablet 0  . tamsulosin (FLOMAX) 0.4 MG CAPS capsule TAKE 1 CAPSULE BY MOUTH ONCE DAILY (Patient taking differently: TAKE 0.4 MG BY MOUTH ONCE DAILY IN THE EVENING) 90 capsule 3   No facility-administered medications prior to visit.      Allergies:   Niacin and related; Metformin and related; and Statins   Social History   Socioeconomic History  . Marital status: Married    Spouse name: Not on file  . Number of children: 5  . Years of education: Not on file  . Highest education level: Not on file  Occupational History  .  Occupation: retired    Fish farm manager: RETIRED  Social Needs  . Financial resource strain: Not on file  . Food insecurity:    Worry: Not on file    Inability: Not on file  . Transportation needs:    Medical: Not on file    Non-medical: Not on file  Tobacco Use  . Smoking status: Former Smoker    Packs/day: 2.00    Years: 39.00    Pack years: 78.00    Types: Cigarettes    Last attempt to quit: 01/24/1994    Years since quitting: 23.6  . Smokeless tobacco: Never Used  Substance and Sexual Activity  . Alcohol use: No  . Drug use: No  . Sexual activity: Never  Lifestyle  . Physical activity:    Days per week: Not on file    Minutes per session: Not on file  . Stress: Not on file  Relationships  . Social connections:    Talks on phone: Not on file    Gets together: Not on file    Attends religious service: Not on file    Active member of club or organization: Not on file    Attends meetings of clubs or organizations: Not on file    Relationship status: Not on file  Other Topics Concern  . Not on file  Social History Narrative  . Not on file     Family History:  The patient's family history includes Arrhythmia in  his brother; Cancer in his brother; Heart attack in his brother and father; Parkinsonism in his brother.   ROS:   Please see the history of present illness.    ROS All other systems reviewed and are negative.   PHYSICAL EXAM:   VS:  There were no vitals taken for this visit.   GEN: Well nourished, well developed, in no acute distress  HEENT: normal  Neck: no JVD, carotid bruits, or masses Cardiac: RRR; no murmurs, rubs, or gallops,no edema  +pacemaker present Respiratory:  clear to auscultation bilaterally, normal work of breathing GI: soft, nontender, nondistended, + BS MS: no deformity or atrophy  Skin: warm and dry, no rash Neuro:  Alert and Oriented x 3, Strength and sensation are intact Psych: euthymic mood, full affect  Wt Readings from Last 3 Encounters:  06/20/17 265 lb (120.2 kg)  06/14/17 261 lb (118.4 kg)  05/29/17 259 lb (117.5 kg)      Studies/Labs Reviewed:   EKG:  EKG is not ordered today.   Recent Labs: 02/07/2017: TSH 0.855 05/05/2017: ALT 20; B Natriuretic Peptide 280.0 05/10/2017: BUN 18; Creatinine, Ser 1.14; Hemoglobin 10.8; Magnesium 2.0; Platelets 188; Potassium 4.8; Sodium 137   Lipid Panel    Component Value Date/Time   CHOL 116 02/07/2017 1516   TRIG 81 02/07/2017 1516   HDL 48 02/07/2017 1516   CHOLHDL 2.3 12/11/2015 0854   VLDL 12 12/11/2015 0854   LDLCALC 52 02/07/2017 1516   Labs dated 07/31/17; Hgb 10.6. Glucose 137. Otherwise CMET normal. Cholesterol 110, triglycerides 74, HDL 49, LDL 46.   Additional studies/ records that were reviewed today include:   Cath 03/23/2017 Conclusion     Mid RCA lesion is 100% stenosed.  Ost LM to Dist LM lesion is 70% stenosed.  Prox Cx lesion is 90% stenosed.  Ost 2nd Mrg lesion is 100% stenosed.  Prox LAD-1 lesion is 90% stenosed.  Prox LAD-2 lesion is 100% stenosed.  LIMA and is normal in caliber.  The graft exhibits no disease.  SVG graft was visualized by angiography and is normal in  caliber.  Left radial artery graft was visualized by angiography.   1.  Severe native three-vessel coronary artery disease with total occlusion of the RCA, severe left main stenosis, total occlusion of the LAD with severe calcification, and severe stenosis of the circumflex and its branch vessels.  2.  Status post aortocoronary bypass surgery with continued patency of the LIMA to LAD, continued patency of the free radial graft to OM with tight stenosis at the distal anastomosis involving a small caliber vessel, and continued patency of the saphenous vein graft to PDA without any significant disease.  3.  Severe aortic valve stenosis with a mean gradient of 40 mmHg.  The calculated valve area is 1.74 cm likely related to high cardiac output  4.  Moderate pulmonary hypertension with a PA pressure of 72/25 with a mean of 38   Recommendations: The patient will undergo permanent pacemaker placement for symptomatic atrial fibrillation with slow ventricular rate.  He will continue to undergo evaluation by the multidisciplinary heart team for consideration of treatment options for his severe aortic stenosis.      Limited echo 05/10/2017 LV EF: 60% -   65% Study Conclusions  - Left ventricle: The cavity size was normal. Systolic function was   normal. The estimated ejection fraction was in the range of 60%   to 65%. Wall motion was normal; there were no regional wall   motion abnormalities. - Ventricular septum: The contour showed diastolic flattening. - Aortic valve: A TAVR bioprosthesis was present. There was no   regurgitation. Peak velocity (S): 207 cm/s. Mean gradient (S): 7   mm Hg. Valve area (VTI): 1.65 cm^2. Valve area (Vmax): 1.44 cm^2.   Valve area (Vmean): 1.77 cm^2. - Mitral valve: Moderately calcified annulus. - Pulmonary arteries: Systolic pressure was moderately to severely   increased. PA peak pressure: 65 mm Hg (S).  Echo 06/14/17:Study Conclusions  - Left  ventricle: The cavity size was normal. Wall thickness was   increased in a pattern of moderate LVH. Indeterminant diastolic   function. The estimated ejection fraction was 55%. Although no   diagnostic regional wall motion abnormality was identified, this   possibility cannot be completely excluded on the basis of this   study. - Aortic valve: Bioprosthetic aortic valve s/p TAVR. There was no   stenosis. There was no regurgitation. Mean gradient (S): 12 mm   Hg. - Mitral valve: Moderately calcified annulus. - Left atrium: The atrium was severely dilated. - Right ventricle: The cavity size was mildly dilated. Pacer wire   or catheter noted in right ventricle. Systolic function was   normal. - Right atrium: The atrium was mildly dilated. - Tricuspid valve: Peak RV-RA gradient (S): 36 mm Hg. - Pulmonary arteries: PA peak pressure: 39 mm Hg (S). - Inferior vena cava: The vessel was normal in size. The   respirophasic diameter changes were in the normal range (>= 50%),   consistent with normal central venous pressure.  Impressions:  - Normal LV size with moderate LV hypertrophy. EF 55%. Mildly   dilated RV with normal systolic function. Bioprosthetic aortic   valve s/p TAVR, no significant stenosis or regurgitation. Mild   pulmonary hypertension.      ASSESSMENT:    No diagnosis found.   PLAN:  In order of problems listed above:  1. CAD s/p CABG: Patient underwent a cardiac catheterization in February 2019, medical therapy was recommended.  On aspirin.  2. Severe aortic stenosis s/p TAVR: Echo in May showed good valve function.  3. History of permanent atrial fibrillation with slow ventricular rate: S/p pacemaker.  On Eliquis 5 mg twice daily.  4. Hypertension: Pressure mildly elevated, increase amlodipine to 5 mg daily  5. Hyperlipidemia: intolerant to statins  6. Carotid artery disease: History of left carotid endarterectomy in 2014.  Stable on last ultrasound  01/10/2017.    Medication Adjustments/Labs and Tests Ordered: Current medicines are reviewed at length with the patient today.  Concerns regarding medicines are outlined above.  Medication changes, Labs and Tests ordered today are listed in the Patient Instructions below. There are no Patient Instructions on file for this visit.   Signed, [name] [name], MD  09/17/2017 9:34 AM    [facility] [place], [place], [place]  [address] Phone: [phone]; Fax: [phone]

## 2017-09-19 ENCOUNTER — Telehealth: Payer: Self-pay | Admitting: Cardiology

## 2017-09-19 ENCOUNTER — Ambulatory Visit (INDEPENDENT_AMBULATORY_CARE_PROVIDER_SITE_OTHER): Payer: Medicare Other | Admitting: *Deleted

## 2017-09-19 DIAGNOSIS — R001 Bradycardia, unspecified: Secondary | ICD-10-CM | POA: Diagnosis not present

## 2017-09-19 NOTE — Progress Notes (Signed)
Remote pacemaker transmission.   

## 2017-09-19 NOTE — Telephone Encounter (Signed)
Confirmed remote transmission w/ pt wife.   

## 2017-09-20 ENCOUNTER — Telehealth: Payer: Self-pay | Admitting: Cardiology

## 2017-09-20 NOTE — Telephone Encounter (Signed)
° °  Spouse calling to request appointment on 09/22/17 be changed to another time that day . Advised spouse no other time available. She is requesting accommodations be made by nurse. Requesting nurse call.

## 2017-09-21 ENCOUNTER — Encounter: Payer: Self-pay | Admitting: Cardiology

## 2017-09-21 NOTE — Telephone Encounter (Signed)
Returned call to patient's wife 09/20/17.She stated she needed to switch times of husband's appointment on 09/22/17.Appointment changed to 8/30 at 4:20 pm.

## 2017-09-21 NOTE — Progress Notes (Signed)
Cardiology Office Note    Date:  09/22/2017   ID:  Evan [name], DOB [birth year], MRN [id number]  PCP:  Katheren Shams  Cardiologist: Dr. [name] Electrophysiologist Dr. Curt Bears PV specialist: Dr. Burt Knack  Chief Complaint  Patient presents with  . Follow-up    History of Present Illness:  Evan [name] is a 77 y.o. male seen for follow up CAD and prior TAVR. He has a  past medical history of CAD s/p CABG 2005, hypertension, hyperlipidemia, carotid artery disease, permanent atrial fibrillation, severe aortic stenosis s/p TAVR, DM 2, and pacemaker.  Cardiac catheterization in 2007 demonstrated patent grafts.  Myoview in 2013 showed no significant abnormality.  He had left carotid enterectomy in May 2014.  He is also intolerant to statins due to severe myalgia and weakness.  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  Sleep study in October 2017 only showed borderline sleep apnea with minimal desaturation.    Follow-up echocardiogram obtained on 03/01/2017 showed the patient's aortic stenosis has significantly progressed since 2017.  Dr. Curt Bears recommended permanent pacemaker placement due to severe conduction disease and this was placed at the end of February. He eventually had St Jude MRI compatible dual-chamber pacemaker implanted on 03/23/2017.  He was evaluated by Dr. Burt Knack for TAVR and eventually underwent the procedure with Dr. Cyndia Bent and Dr. Burt Knack with placement of Edwards Sapien 3 THV size 26 mm bioprosthetic valve on 05/09/17.  He had no complications. Follow up Echo in May showed a good result and pacemaker was functioning well at that time.  He is seen today. Feeling well. Main complaint is of legs aching particularly in his knees. Occasionally has SOB with exertion but overall breathing doing OK. Oxygen levels good.  Completed cardiac Rehab in [place]. No chest pain.    Past Medical History:  Diagnosis Date  . Anemia   . Carotid arterial disease ([place])   . CHF (congestive heart failure) ([place])   . Coronary artery disease   . HOH (hard of hearing)   . Hyperlipidemia   . Hypertension   . Nocturia   . Obesity   . Osteoarthritis    "BUE; BLE; right knee" (03/23/2017)  . Peripheral vision loss, bilateral    S/P 07/2015  . Permanent atrial fibrillation ([place])   . 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 Severe aortic stenosis   . Sinus drainage   . Stroke [facility]) 07/2015   "peripheral vision still bad out of left eye" (03/23/2017)  . Torn rotator cuff 2012   Right arm  . Type II diabetes mellitus ([place])   . Urinary frequency     Past Surgical History:  Procedure Laterality Date  . BACK SURGERY    . CARDIAC CATHETERIZATION  11/29/2005   SEVERE THREE VESSEL OBSTRUCTIVE ATHERSCLEROTIC CAD. ALL GRAFTS WERE PATENT. EF 65%  . CARDIAC CATHETERIZATION  03/23/2017  . CAROTID ENDARTERECTOMY Right 01-29-08   cea  . CARPAL TUNNEL RELEASE Right   . CORONARY ARTERY BYPASS GRAFT  03/2003   X3. LIMA GRAFT TO THE LAD, SAPHENOUS VEIN GRAFT TO THE PA, AND A LEFT RADIAL GRAFT TO THEOBTUSE MARGINAL VESSEL  . ENDARTERECTOMY Left 05/28/2012   Procedure: ENDARTERECTOMY CAROTID;  Surgeon: Elam Dutch, MD;  Location: [place];  Service: Vascular;  Laterality: Left;  . INSERT / REPLACE / REMOVE PACEMAKER  03/23/2017  . [place]; 1984  . MOLE SURGERY     "? face/arms"  . PACEMAKER IMPLANT N/A 03/23/2017   Procedure: PACEMAKER IMPLANT;  Surgeon: Constance Haw, MD;  Location: [place] CV [facility];  Service: Cardiovascular;  Laterality: N/A;  . PATCH ANGIOPLASTY Left 05/28/2012   Procedure: WITH dACRON PATCH ANGIOPLASTY;  Surgeon: Elam Dutch, MD;  Location: [employer];  Service: Vascular;  Laterality: Left;  . RIGHT/LEFT HEART CATH AND CORONARY/GRAFT ANGIOGRAPHY N/A 03/23/2017   Procedure:  RIGHT/LEFT HEART CATH AND CORONARY/GRAFT ANGIOGRAPHY;  Surgeon: Sherren Mocha, MD;  Location: [place] CV LAB;  Service: Cardiovascular;  Laterality: N/A;  . TEE WITHOUT CARDIOVERSION N/A 05/09/2017   Procedure: TRANSESOPHAGEAL ECHOCARDIOGRAM (TEE);  Surgeon: Sherren Mocha, MD;  Location: [place];  Service: Open Heart Surgery;  Laterality: N/A;  . TRANSCATHETER AORTIC VALVE REPLACEMENT, TRANSFEMORAL N/A 05/09/2017   Procedure: TRANSCATHETER AORTIC VALVE REPLACEMENT, TRANSFEMORAL using an Edwards 65mm Sapien 3 Aortic Valve;  Surgeon: Sherren Mocha, MD;  Location: [facility];  Service: Open Heart Surgery;  Laterality: N/A;    Current Medications: Outpatient Medications Prior to Visit  Medication Sig Dispense Refill  . acetaminophen (TYLENOL) 650 MG CR tablet Take 1,300 mg by mouth every 8 (eight) hours as needed for pain.     Marland Kitchen amLODipine (NORVASC) 5 MG tablet Take 1 tablet (5 mg total) by mouth daily. 90 tablet 3  . apixaban (ELIQUIS) 5 MG TABS tablet Take 1 tablet (5 mg total) by mouth 2 (two) times daily. 28 tablet 0  . aspirin EC 81 MG EC tablet Take 1 tablet (81 mg total) by mouth daily.    . benazepril (LOTENSIN) 40 MG tablet Take 1 tablet (40 mg total) by mouth daily. 90 tablet 3  . Camphor-Menthol-Methyl Sal (MUSCLE RUB ULTRA STRENGTH) 05-03-28 % CREA Apply 1 application topically 3 (three) times daily as needed (muscle pain).    . Cyanocobalamin (RA VITAMIN B-12 TR) 1000 MCG TBCR Take 1,000 mcg by mouth daily.     Marland Kitchen ezetimibe (ZETIA) 10 MG tablet Take 1 tablet (10 mg total) by mouth daily. (Patient taking differently: Take 10 mg by mouth every evening. ) 30 tablet 11  . fluticasone (FLONASE) 50 MCG/ACT nasal spray Place 2 sprays into both nostrils daily.    Marland Kitchen glimepiride (AMARYL) 2 MG tablet Take 2 mg by mouth daily with breakfast.    . guaiFENesin-codeine 100-10 MG/5ML syrup Take 5 mLs by mouth every 6 (six) hours as needed for cough.     . hydrochlorothiazide (HYDRODIURIL) 25 MG tablet  Take 1 tablet (25 mg total) by mouth every morning. 90 tablet 1  . OVER THE COUNTER MEDICATION Replenex tablets: Take 3 tablet by mouth every morning (joint health)    . Polyvinyl Alcohol-Povidone (REFRESH OP) Place 2 drops into both eyes daily as needed (for dry eyes).     . tamsulosin (FLOMAX) 0.4 MG CAPS capsule TAKE 1 CAPSULE BY MOUTH ONCE DAILY (Patient taking differently: TAKE 0.4 MG BY MOUTH ONCE DAILY IN THE EVENING) 90 capsule 3  . rosuvastatin (CRESTOR) 20 MG tablet Take 1 tablet (20 mg total) by mouth daily. (Patient taking differently: Take 20 mg by mouth every evening. ) 30 tablet 0   No facility-administered medications prior to visit.      Allergies:   Niacin and related; Metformin and related; and Statins   Social History   Socioeconomic History  . Marital status: Married    Spouse name: Not  on file  . Number of children: 5  . Years of education: Not on file  . Highest education level: Not on file  Occupational History  . Occupation: retired    Fish farm manager: RETIRED  Social Needs  . Financial resource strain: Not on file  . Food insecurity:    Worry: Not on file    Inability: Not on file  . Transportation needs:    Medical: Not on file    Non-medical: Not on file  Tobacco Use  . Smoking status: Former Smoker    Packs/day: 2.00    Years: 39.00    Pack years: 78.00    Types: Cigarettes    Last attempt to quit: 01/24/1994    Years since quitting: 23.6  . Smokeless tobacco: Never Used  Substance and Sexual Activity  . Alcohol use: No  . Drug use: No  . Sexual activity: Never  Lifestyle  . Physical activity:    Days per week: Not on file    Minutes per session: Not on file  . Stress: Not on file  Relationships  . Social connections:    Talks on phone: Not on file    Gets together: Not on file    Attends religious service: Not on file    Active member of club or organization: Not on file    Attends meetings of clubs or organizations: Not on file     Relationship status: Not on file  Other Topics Concern  . Not on file  Social History Narrative  . Not on file     Family History:  The patient's family history includes Arrhythmia in his brother; Cancer in his brother; Heart attack in his brother and father; Parkinsonism in his brother.   ROS:   Please see the history of present illness.    ROS All other systems reviewed and are negative.   PHYSICAL EXAM:   VS:  BP 136/61   Pulse 61   Ht 6' (1.829 m)   Wt 266 lb 12.8 oz (121 kg)   BMI 36.18 kg/m    GEN: Well nourished, well developed, in no acute distress  HEENT: normal  Neck: no JVD, carotid bruits, or masses Cardiac: RRR; no murmurs, rubs, or gallops,no edema  +pacemaker present Respiratory:  clear to auscultation bilaterally, normal work of breathing GI: soft, nontender, nondistended, + BS MS: no deformity or atrophy  Skin: warm and dry, no rash Neuro:  Alert and Oriented x 3, Strength and sensation are intact Psych: euthymic mood, full affect  Wt Readings from Last 3 Encounters:  09/22/17 266 lb 12.8 oz (121 kg)  06/20/17 265 lb (120.2 kg)  06/14/17 261 lb (118.4 kg)      Studies/Labs Reviewed:   EKG:  EKG is not ordered today.   Recent Labs: 02/07/2017: TSH 0.855 05/05/2017: ALT 20; B Natriuretic Peptide 280.0 05/10/2017: BUN 18; Creatinine, Ser 1.14; Hemoglobin 10.8; Magnesium 2.0; Platelets 188; Potassium 4.8; Sodium 137   Lipid Panel    Component Value Date/Time   CHOL 116 02/07/2017 1516   TRIG 81 02/07/2017 1516   HDL 48 02/07/2017 1516   CHOLHDL 2.3 12/11/2015 0854   VLDL 12 12/11/2015 0854   LDLCALC 52 02/07/2017 1516   Labs dated 07/31/17; Hgb 10.6. Glucose 137. Otherwise CMET normal. Cholesterol 110, triglycerides 74, HDL 49, LDL 46.   Additional studies/ records that were reviewed today include:   Cath 03/23/2017 Conclusion     Mid RCA lesion is 100% stenosed.  Colon Flattery  LM to Dist LM lesion is 70% stenosed.  Prox Cx lesion is 90%  stenosed.  Ost 2nd Mrg lesion is 100% stenosed.  Prox LAD-1 lesion is 90% stenosed.  Prox LAD-2 lesion is 100% stenosed.  LIMA and is normal in caliber.  The graft exhibits no disease.  SVG graft was visualized by angiography and is normal in caliber.  Left radial artery graft was visualized by angiography.   1.  Severe native three-vessel coronary artery disease with total occlusion of the RCA, severe left main stenosis, total occlusion of the LAD with severe calcification, and severe stenosis of the circumflex and its branch vessels.  2.  Status post aortocoronary bypass surgery with continued patency of the LIMA to LAD, continued patency of the free radial graft to OM with tight stenosis at the distal anastomosis involving a small caliber vessel, and continued patency of the saphenous vein graft to PDA without any significant disease.  3.  Severe aortic valve stenosis with a mean gradient of 40 mmHg.  The calculated valve area is 1.74 cm likely related to high cardiac output  4.  Moderate pulmonary hypertension with a PA pressure of 72/25 with a mean of 38   Recommendations: The patient will undergo permanent pacemaker placement for symptomatic atrial fibrillation with slow ventricular rate.  He will continue to undergo evaluation by the multidisciplinary heart team for consideration of treatment options for his severe aortic stenosis.      Limited echo 05/10/2017 LV EF: 60% -   65% Study Conclusions  - Left ventricle: The cavity size was normal. Systolic function was   normal. The estimated ejection fraction was in the range of 60%   to 65%. Wall motion was normal; there were no regional wall   motion abnormalities. - Ventricular septum: The contour showed diastolic flattening. - Aortic valve: A TAVR bioprosthesis was present. There was no   regurgitation. Peak velocity (S): 207 cm/s. Mean gradient (S): 7   mm Hg. Valve area (VTI): 1.65 cm^2. Valve area (Vmax):  1.44 cm^2.   Valve area (Vmean): 1.77 cm^2. - Mitral valve: Moderately calcified annulus. - Pulmonary arteries: Systolic pressure was moderately to severely   increased. PA peak pressure: 65 mm Hg (S).  Echo 06/14/17:Study Conclusions  - Left ventricle: The cavity size was normal. Wall thickness was   increased in a pattern of moderate LVH. Indeterminant diastolic   function. The estimated ejection fraction was 55%. Although no   diagnostic regional wall motion abnormality was identified, this   possibility cannot be completely excluded on the basis of this   study. - Aortic valve: Bioprosthetic aortic valve s/p TAVR. There was no   stenosis. There was no regurgitation. Mean gradient (S): 12 mm   Hg. - Mitral valve: Moderately calcified annulus. - Left atrium: The atrium was severely dilated. - Right ventricle: The cavity size was mildly dilated. Pacer wire   or catheter noted in right ventricle. Systolic function was   normal. - Right atrium: The atrium was mildly dilated. - Tricuspid valve: Peak RV-RA gradient (S): 36 mm Hg. - Pulmonary arteries: PA peak pressure: 39 mm Hg (S). - Inferior vena cava: The vessel was normal in size. The   respirophasic diameter changes were in the normal range (>= 50%),   consistent with normal central venous pressure.  Impressions:  - Normal LV size with moderate LV hypertrophy. EF 55%. Mildly   dilated RV with normal systolic function. Bioprosthetic aortic   valve s/p TAVR, no  significant stenosis or regurgitation. Mild   pulmonary hypertension.      ASSESSMENT:    1. Permanent atrial fibrillation ([place])   2. S/P placement of cardiac pacemaker   3. S/P TAVR (transcatheter aortic valve replacement)   4. Coronary artery disease involving coronary bypass graft of native heart without angina pectoris      PLAN:  In order of problems listed above:  1. CAD s/p CABG: Patient underwent a cardiac catheterization in February 2019, grafts  still patent. Continued medical therapy was recommended.    2. Severe aortic stenosis s/p TAVR in April 2019: Echo in May showed good valve function. Plan to stop ASA in October since he is on Eliquis.   3. History of permanent atrial fibrillation with slow ventricular rate: S/p pacemaker.  On Eliquis 5 mg twice daily.  4. Hypertension: BP is well controlled.   5. Hyperlipidemia: on Crestor with excellent lipids. With leg pain will reduce dose to 10 mg daily.  6. Carotid artery disease: History of left carotid endarterectomy in 2014.  Stable on last ultrasound 01/10/2017.  7.   S/p right PCA CVA with visual cut.     Medication Adjustments/Labs and Tests Ordered: Current medicines are reviewed at length with the patient today.  Concerns regarding medicines are outlined above.  Medication changes, Labs and Tests ordered today are listed in the Patient Instructions below. Patient Instructions  We will reduce crestor to 10 mg daily  In mid October you can stop taking ASA  I will see you in 6 months.    Signed, Ariez Neilan [name], MD  09/22/2017 4:24 PM    [facility] [address], [place], [place]  [address] Phone: [phone]; Fax: [phone]

## 2017-09-22 ENCOUNTER — Ambulatory Visit (INDEPENDENT_AMBULATORY_CARE_PROVIDER_SITE_OTHER): Payer: Medicare Other | Admitting: Cardiology

## 2017-09-22 ENCOUNTER — Encounter: Payer: Self-pay | Admitting: Cardiology

## 2017-09-22 ENCOUNTER — Ambulatory Visit: Payer: Medicare Other | Admitting: Cardiology

## 2017-09-22 VITALS — BP 136/61 | HR 61 | Ht 72.0 in | Wt 266.8 lb

## 2017-09-22 DIAGNOSIS — Z950 Presence of cardiac pacemaker: Secondary | ICD-10-CM

## 2017-09-22 DIAGNOSIS — I6523 Occlusion and stenosis of bilateral carotid arteries: Secondary | ICD-10-CM | POA: Diagnosis not present

## 2017-09-22 DIAGNOSIS — I25810 Atherosclerosis of coronary artery bypass graft(s) without angina pectoris: Secondary | ICD-10-CM

## 2017-09-22 DIAGNOSIS — Z952 Presence of prosthetic heart valve: Secondary | ICD-10-CM

## 2017-09-22 DIAGNOSIS — I482 Chronic atrial fibrillation: Secondary | ICD-10-CM | POA: Diagnosis not present

## 2017-09-22 DIAGNOSIS — I4821 Permanent atrial fibrillation: Secondary | ICD-10-CM

## 2017-09-22 MED ORDER — ROSUVASTATIN CALCIUM 10 MG PO TABS: 10.0000 mg | ORAL_TABLET | Freq: Every day | ORAL | 3 refills | Status: DC

## 2017-09-22 NOTE — Patient Instructions (Signed)
We will reduce crestor to 10 mg daily  In mid October you can stop taking ASA  I will see you in 6 months.

## 2017-10-12 ENCOUNTER — Telehealth: Payer: Self-pay | Admitting: Cardiology

## 2017-10-12 NOTE — Telephone Encounter (Signed)
New Message    Pt c/o swelling: STAT is pt has developed SOB within 24 hours  1. How much weight have you gained and in what time span? Unknown patient does not weigh himself daily   2. If swelling, where is the swelling located? feet  3. Are you currently taking a fluid pill? yes  4. Are you currently SOB? No   5. Do you have a log of your daily weights (if so, list)? No   6. Have you gained 3 pounds in a day or 5 pounds in a week? unknown 7. Have you traveled recently? No   Patients wife is calling because his feet is swelling and she is concerned. She is wondering if he needs to increase his fluid pill.

## 2017-10-12 NOTE — Telephone Encounter (Signed)
Patient wife called states that patient is only have swelling his feet. I advised patient wife to elevate his legs at night because he was not doing so, also advised to get compression stockings as he did not have those, and to watch his salt intake as he was adding salt to meals and over using. Patient wife stated that she would have him do these things, I advised patient wife to call back in a week/week in a half if swelling did not go down. Patient wife verbalized understanding.

## 2017-10-12 NOTE — Telephone Encounter (Signed)
New Message    Pt c/o swelling: STAT is pt has developed SOB within 24 hours  1) How much weight have you gained and in what time span? Unknown patient does not weigh himself daily   2) If swelling, where is the swelling located? feet  3) Are you currently taking a fluid pill? yes  4) Are you currently SOB? No   5) Do you have a log of your daily weights (if so, list)? No   6) Have you gained 3 pounds in a day or 5 pounds in a week? unknown 7) Have you traveled recently? No   Patients wife is calling because his feet is swelling and she is concerned. She is wondering if he needs to increase his fluid pill.

## 2017-10-12 NOTE — Telephone Encounter (Signed)
Patient wife was also advised to weigh daily and to keep record. They verbalized understanding.

## 2017-10-23 LAB — CUP PACEART REMOTE DEVICE CHECK
Implantable Lead Location: 753860
Implantable Pulse Generator Implant Date: 20190228
Lead Channel Pacing Threshold Pulse Width: 0.5 ms
Lead Channel Sensing Intrinsic Amplitude: 12 mV
Lead Channel Setting Pacing Pulse Width: 0.5 ms
Lead Channel Setting Sensing Sensitivity: 2 mV
MDC IDC LEAD IMPLANT DT: 20190228
MDC IDC MSMT BATTERY REMAINING LONGEVITY: 139 mo
MDC IDC MSMT BATTERY REMAINING PERCENTAGE: 95.5 %
MDC IDC MSMT BATTERY VOLTAGE: 3.01 V
MDC IDC MSMT LEADCHNL RV IMPEDANCE VALUE: 510 Ohm
MDC IDC MSMT LEADCHNL RV PACING THRESHOLD AMPLITUDE: 0.5 V
MDC IDC PG SERIAL: 8971140
MDC IDC SESS DTM: 20190827192220
MDC IDC SET LEADCHNL RV PACING AMPLITUDE: 0.75 V
MDC IDC STAT BRADY RV PERCENT PACED: 94 %

## 2017-11-13 ENCOUNTER — Telehealth: Payer: Self-pay | Admitting: Cardiology

## 2017-11-13 NOTE — Telephone Encounter (Signed)
Per the last office note on 08/30, Evan [name]'s plan is to d/c ASA this October due to patient being on eliquis.

## 2017-11-13 NOTE — Telephone Encounter (Signed)
New Message   Pt c/o medication issue:  1. Name of Medication: aspirin 2. How are you currently taking this medication (dosage and times per day)?   3. Are you having a reaction (difficulty breathing--STAT)?   4. What is your medication issue? Patients wife is calling on behalf. Patient had a valve replacement and they were discussing taking him off the aspirin so she wants to know can she stop it. Please call to discuss.

## 2017-12-07 ENCOUNTER — Telehealth: Payer: Self-pay | Admitting: Cardiology

## 2017-12-07 NOTE — Telephone Encounter (Signed)
Pharm please make note on Eliquis  Dr. [name] if pt stable - you saw him in August can we clear, vs having an OV?  Please send note to CV pre-op pool thanks.

## 2017-12-07 NOTE — Telephone Encounter (Signed)
Yes he is clear for cataract surgery  Colleena Kurtenbach [name] MD, [facility]

## 2017-12-07 NOTE — Telephone Encounter (Signed)
   [facility] Pre-operative Risk Assessment    Request for surgical clearance:  1. What type of surgery is being performed? Complex cataract extraction by PE, IOL - Right (IV sedation)   2. When is this surgery scheduled? 12/27/17   3. What type of clearance is required (medical clearance vs. Pharmacy clearance to hold med vs. Both)? Both  4. Are there any medications that need to be held prior to surgery and how long? None specified - on ASA & Eliquis   5. Practice name and name of physician performing surgery? Dr. Tama High @ [facility]    6. What is your office phone number [phone] ext: [phone]    7.   What is your office fax number [phone]  8.   Anesthesia type (None, local, MAC, general) ? IV sedation (done at [facility])   Evan [name] M 12/07/2017, 4:36 PM  _________________________________________________________________   (provider comments below)

## 2017-12-08 NOTE — Telephone Encounter (Signed)
Forwarded to requesting part via EPIC

## 2017-12-08 NOTE — Telephone Encounter (Signed)
Follow Up: ° ° ° °Returning your call from this morning. °

## 2017-12-08 NOTE — Telephone Encounter (Signed)
Typically recommend continuing Eliquis for routine cataract removal. Not sure if there is additional bleed risk with "complex cataract extraction" that entails IV sedation, but pt would be ok to hold 1 dose of Eliquis prior if this is required (on Eliquis for afib but has hx of stroke).

## 2017-12-08 NOTE — Telephone Encounter (Signed)
   Primary Cardiologist:[name] [name], MD  Chart reviewed as part of pre-operative protocol coverage. Because of Evan [name]'s past medical history and time since last visit, he/she will require a follow-up visit in order to better assess preoperative cardiovascular risk.  Pre-op covering staff:  I talked with pt and he is more SOB with exertion and would prefer to be seen prior to surgery.  Please arrange. - Please schedule appointment and call patient to inform them. - Please contact requesting surgeon's office via preferred method (i.e, phone, fax) to inform them of need for appointment prior to surgery.  If applicable, this message will also be routed to pharmacy pool and/or primary cardiologist for input on holding anticoagulant/antiplatelet agent as requested below so that this information is available at time of patient's appointment.   Cecilie Kicks, NP  12/08/2017, 3:43 PM

## 2017-12-08 NOTE — Telephone Encounter (Signed)
DPR ok to s/w pt's wife. Scheduled sooner appt with Almyra Deforest, PA for cataract surgery for 12/27/17. I will remove from the call back pool.

## 2017-12-14 ENCOUNTER — Encounter: Payer: Self-pay | Admitting: Physician Assistant

## 2017-12-14 ENCOUNTER — Ambulatory Visit (INDEPENDENT_AMBULATORY_CARE_PROVIDER_SITE_OTHER): Payer: Medicare Other | Admitting: Physician Assistant

## 2017-12-14 VITALS — BP 150/72 | HR 66 | Wt 269.0 lb

## 2017-12-14 DIAGNOSIS — I10 Essential (primary) hypertension: Secondary | ICD-10-CM | POA: Diagnosis not present

## 2017-12-14 DIAGNOSIS — E785 Hyperlipidemia, unspecified: Secondary | ICD-10-CM

## 2017-12-14 DIAGNOSIS — I6523 Occlusion and stenosis of bilateral carotid arteries: Secondary | ICD-10-CM | POA: Diagnosis not present

## 2017-12-14 DIAGNOSIS — I25810 Atherosclerosis of coronary artery bypass graft(s) without angina pectoris: Secondary | ICD-10-CM | POA: Diagnosis not present

## 2017-12-14 DIAGNOSIS — I4821 Permanent atrial fibrillation: Secondary | ICD-10-CM | POA: Diagnosis not present

## 2017-12-14 DIAGNOSIS — Z953 Presence of xenogenic heart valve: Secondary | ICD-10-CM

## 2017-12-14 DIAGNOSIS — Z950 Presence of cardiac pacemaker: Secondary | ICD-10-CM

## 2017-12-14 DIAGNOSIS — Z8673 Personal history of transient ischemic attack (TIA), and cerebral infarction without residual deficits: Secondary | ICD-10-CM

## 2017-12-14 MED ORDER — HYDRALAZINE HCL 25 MG PO TABS: 25.0000 mg | ORAL_TABLET | Freq: Two times a day (BID) | ORAL | 3 refills | Status: DC

## 2017-12-14 NOTE — Patient Instructions (Addendum)
Medication Instructions:  START Hydralazine 25mg  take 1 tablet twice a day If you need a refill on your cardiac medications before your next appointment, please call your pharmacy.   Lab work: None  If you have labs (blood work) drawn today and your tests are completely normal, you will receive your results only by: Marland Kitchen MyChart Message (if you have MyChart) OR . A paper copy in the mail If you have any lab test that is abnormal or we need to change your treatment, we will call you to review the results.  Testing/Procedures: None   Follow-Up: At [facility], you and your health needs are our priority.  As part of our continuing mission to provide you with exceptional heart care, we have created designated Provider Care Teams.  These Care Teams include your primary Cardiologist (physician) and Advanced Practice Providers (APPs -  Physician Assistants and Nurse Practitioners) who all work together to provide you with the care you need, when you need it. . Your physician recommends that you schedule a follow-up appointment in: 3-4 months with Dr [name]  Any Other Special Instructions Will Be Listed Below (If Applicable). Cleared for upcoming Cataract surgery

## 2017-12-14 NOTE — Progress Notes (Signed)
Cardiology Office Note    Date:  12/16/2017   ID:  [name], DOB Dec 18, 1940, MRN [id number]  PCP:  Katheren Shams  Cardiologist:  Dr. [name] Electrophysiologist Dr. Curt Bears PV specialist: Dr. Burt Knack  Chief Complaint  Patient presents with  . Follow-up    Seen for Dr. [name]    History of Present Illness:  Evan [name] is a 78 y.o. male with past medical history of CAD s/p CABG 2005, carotid artery disease, hypertension, hyperlipidemia, DM 2, permanent atrial fibrillation, pacemaker and severe AS s/p TAVR.  Cardiac catheterization in 2007 demonstrated patent grafts.  Myoview in 2013 showed no significant abnormality.  He had a left carotid enterectomy in May 2014.  He is intolerant to statins due to severe myalgia and weakness.  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  Anticoagulation was initially held, and he was treated with aspirin before restarting Eliquis later.  Sleep study in October 2017 showed borderline sleep apnea with minimal desaturation.  Echocardiogram in February 2019 showed severe aortic stenosis.  Dr. Curt Bears also recommended permanent pacemaker due to severe conduction disease, this was placed near the end of February with a [employer] Jude MRI compatible dual-chamber pacemaker on 03/15/2017.  He underwent cardiac catheterization on 03/23/2017 which showed severe three-vessel CAD with patent LIMA to LAD, continued patency of free radial graft to OM with tight stenosis at the distal anastomosis involving a small caliber vessel, patent SVG to PDA, severe aortic stenosis.  He was evaluated by Dr. Burt Knack and underwent TAVR by Dr. Cyndia Bent in April 2019.  Follow-up echo in May showed good results.  He was last seen by Dr. [name] in August 25, 2017.  He was instructed to stop aspirin you October since he is on Eliquis.  Patient presents today for follow-up.  His blood pressure has been elevated recently.  According to his wife, his  blood pressure is in the 150s at home.  I will add hydralazine 25 mg twice daily to his medical regimen.  Otherwise he denies any significant recent chest pain, shortness of breath, lower extremity edema, orthopnea or PND.    Past Medical History:  Diagnosis Date  . Anemia   . Carotid arterial disease ([place])   . CHF (congestive heart failure) ([place])   . Coronary artery disease   . HOH (hard of hearing)   . Hyperlipidemia   . Hypertension   . Nocturia   . Obesity   . Osteoarthritis    "BUE; BLE; right knee" (03/23/2017)  . Peripheral vision loss, bilateral    S/P 07/2015  . Permanent atrial fibrillation   . 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 Severe aortic stenosis   . Sinus drainage   . Stroke [facility]) 07/2015   "peripheral vision still bad out of left eye" (03/23/2017)  . Torn rotator cuff 2012   Right arm  . Type II diabetes mellitus ([place])   . Urinary frequency     Past Surgical History:  Procedure Laterality Date  . BACK SURGERY    . CARDIAC CATHETERIZATION  11/29/2005   SEVERE THREE VESSEL OBSTRUCTIVE ATHERSCLEROTIC CAD. ALL GRAFTS WERE PATENT. EF 65%  . CARDIAC CATHETERIZATION  03/23/2017  . CAROTID ENDARTERECTOMY Right 01-29-08   cea  . CARPAL TUNNEL RELEASE Right   . CORONARY ARTERY BYPASS GRAFT  03/2003   X3. LIMA GRAFT TO THE LAD, SAPHENOUS VEIN GRAFT TO THE PA, AND A LEFT RADIAL GRAFT TO THEOBTUSE  MARGINAL VESSEL  . ENDARTERECTOMY Left 05/28/2012   Procedure: ENDARTERECTOMY CAROTID;  Surgeon: Elam Dutch, MD;  Location: [place];  Service: Vascular;  Laterality: Left;  . INSERT / REPLACE / REMOVE PACEMAKER  03/23/2017  . [place]; 1984  . MOLE SURGERY     "? face/arms"  . PACEMAKER IMPLANT N/A 03/23/2017   Procedure: PACEMAKER IMPLANT;  Surgeon: Constance Haw, MD;  Location: [place] CV LAB;  Service: Cardiovascular;  Laterality: N/A;  . PATCH ANGIOPLASTY Left 05/28/2012   Procedure: WITH dACRON PATCH ANGIOPLASTY;  Surgeon:  Elam Dutch, MD;  Location: [place];  Service: Vascular;  Laterality: Left;  . RIGHT/LEFT HEART CATH AND CORONARY/GRAFT ANGIOGRAPHY N/A 03/23/2017   Procedure: RIGHT/LEFT HEART CATH AND CORONARY/GRAFT ANGIOGRAPHY;  Surgeon: Sherren Mocha, MD;  Location: [place] CV [facility];  Service: Cardiovascular;  Laterality: N/A;  . TEE WITHOUT CARDIOVERSION N/A 05/09/2017   Procedure: TRANSESOPHAGEAL ECHOCARDIOGRAM (TEE);  Surgeon: Sherren Mocha, MD;  Location: [place];  Service: Open Heart Surgery;  Laterality: N/A;  . TRANSCATHETER AORTIC VALVE REPLACEMENT, TRANSFEMORAL N/A 05/09/2017   Procedure: TRANSCATHETER AORTIC VALVE REPLACEMENT, TRANSFEMORAL using an Edwards 72mm Sapien 3 Aortic Valve;  Surgeon: Sherren Mocha, MD;  Location: [place];  Service: Open Heart Surgery;  Laterality: N/A;    Current Medications: Outpatient Medications Prior to Visit  Medication Sig Dispense Refill  . acetaminophen (TYLENOL) 650 MG CR tablet Take 1,300 mg by mouth every 8 (eight) hours as needed for pain.     Marland Kitchen amLODipine (NORVASC) 5 MG tablet Take 1 tablet (5 mg total) by mouth daily. 90 tablet 3  . apixaban (ELIQUIS) 5 MG TABS tablet Take 1 tablet (5 mg total) by mouth 2 (two) times daily. 28 tablet 0  . benazepril (LOTENSIN) 40 MG tablet Take 1 tablet (40 mg total) by mouth daily. 90 tablet 3  . Camphor-Menthol-Methyl Sal (MUSCLE RUB ULTRA STRENGTH) 05-03-28 % CREA Apply 1 application topically 3 (three) times daily as needed (muscle pain).    . Cyanocobalamin (RA VITAMIN B-12 TR) 1000 MCG TBCR Take 1,000 mcg by mouth daily.     Marland Kitchen ezetimibe (ZETIA) 10 MG tablet Take 1 tablet (10 mg total) by mouth daily. (Patient taking differently: Take 10 mg by mouth every evening. ) 30 tablet 11  . fluticasone (FLONASE) 50 MCG/ACT nasal spray Place 2 sprays into both nostrils daily.    Marland Kitchen glimepiride (AMARYL) 2 MG tablet Take 2 mg by mouth daily with breakfast.    . guaiFENesin-codeine 100-10 MG/5ML syrup Take 5 mLs by mouth every 6  (six) hours as needed for cough.     . hydrochlorothiazide (HYDRODIURIL) 25 MG tablet Take 1 tablet (25 mg total) by mouth every morning. 90 tablet 1  . OVER THE COUNTER MEDICATION Replenex tablets: Take 3 tablet by mouth every morning (joint health)    . Polyvinyl Alcohol-Povidone (REFRESH OP) Place 2 drops into both eyes daily as needed (for dry eyes).     . rosuvastatin (CRESTOR) 10 MG tablet Take 1 tablet (10 mg total) by mouth daily. 90 tablet 3  . tamsulosin (FLOMAX) 0.4 MG CAPS capsule TAKE 1 CAPSULE BY MOUTH ONCE DAILY (Patient taking differently: TAKE 0.4 MG BY MOUTH ONCE DAILY IN THE EVENING) 90 capsule 3  . aspirin EC 81 MG EC tablet Take 1 tablet (81 mg total) by mouth daily.     No facility-administered medications prior to visit.      Allergies:   Niacin  and related; Metformin and related; and Statins   Social History   Socioeconomic History  . Marital status: Married    Spouse name: Not on file  . Number of children: 5  . Years of education: Not on file  . Highest education level: Not on file  Occupational History  . Occupation: retired    Fish farm manager: RETIRED  Social Needs  . Financial resource strain: Not on file  . Food insecurity:    Worry: Not on file    Inability: Not on file  . Transportation needs:    Medical: Not on file    Non-medical: Not on file  Tobacco Use  . Smoking status: Former Smoker    Packs/day: 2.00    Years: 39.00    Pack years: 78.00    Types: Cigarettes    Last attempt to quit: 01/24/1994    Years since quitting: 23.9  . Smokeless tobacco: Never Used  Substance and Sexual Activity  . Alcohol use: No  . Drug use: No  . Sexual activity: Never  Lifestyle  . Physical activity:    Days per week: Not on file    Minutes per session: Not on file  . Stress: Not on file  Relationships  . Social connections:    Talks on phone: Not on file    Gets together: Not on file    Attends religious service: Not on file    Active member of club or  organization: Not on file    Attends meetings of clubs or organizations: Not on file    Relationship status: Not on file  Other Topics Concern  . Not on file  Social History Narrative  . Not on file     Family History:  The patient's family history includes Arrhythmia in his brother; Cancer in his brother; Heart attack in his brother and father; Parkinsonism in his brother.   ROS:   Please see the history of present illness.    ROS All other systems reviewed and are negative.   PHYSICAL EXAM:   VS:  BP (!) 150/72   Pulse 66   Wt 269 lb (122 kg)   BMI 36.48 kg/m    GEN: Well nourished, well developed, in no acute distress  HEENT: normal  Neck: no JVD, carotid bruits, or masses Cardiac: Irregularly irregular; no murmurs, rubs, or gallops,no edema  Respiratory:  clear to auscultation bilaterally, normal work of breathing GI: soft, nontender, nondistended, + BS MS: no deformity or atrophy  Skin: warm and dry, no rash Neuro:  Alert and Oriented x 3, Strength and sensation are intact Psych: euthymic mood, full affect  Wt Readings from Last 3 Encounters:  12/14/17 269 lb (122 kg)  09/22/17 266 lb 12.8 oz (121 kg)  06/20/17 265 lb (120.2 kg)      Studies/Labs Reviewed:   EKG:  EKG is ordered today.  The ekg ordered today demonstrates wide complex rhythm, question paced  Recent Labs: 02/07/2017: TSH 0.855 05/05/2017: ALT 20; B Natriuretic Peptide 280.0 05/10/2017: BUN 18; Creatinine, Ser 1.14; Hemoglobin 10.8; Magnesium 2.0; Platelets 188; Potassium 4.8; Sodium 137   Lipid Panel    Component Value Date/Time   CHOL 116 02/07/2017 1516   TRIG 81 02/07/2017 1516   HDL 48 02/07/2017 1516   CHOLHDL 2.3 12/11/2015 0854   VLDL 12 12/11/2015 0854   LDLCALC 52 02/07/2017 1516    Additional studies/ records that were reviewed today include:   Echo 06/14/2017 LV EF: 55% Study Conclusions  -  Left ventricle: The cavity size was normal. Wall thickness was   increased in a  pattern of moderate LVH. Indeterminant diastolic   function. The estimated ejection fraction was 55%. Although no   diagnostic regional wall motion abnormality was identified, this   possibility cannot be completely excluded on the basis of this   study. - Aortic valve: Bioprosthetic aortic valve s/p TAVR. There was no   stenosis. There was no regurgitation. Mean gradient (S): 12 mm   Hg. - Mitral valve: Moderately calcified annulus. - Left atrium: The atrium was severely dilated. - Right ventricle: The cavity size was mildly dilated. Pacer wire   or catheter noted in right ventricle. Systolic function was   normal. - Right atrium: The atrium was mildly dilated. - Tricuspid valve: Peak RV-RA gradient (S): 36 mm Hg. - Pulmonary arteries: PA peak pressure: 39 mm Hg (S). - Inferior vena cava: The vessel was normal in size. The   respirophasic diameter changes were in the normal range (>= 50%),   consistent with normal central venous pressure.  Impressions:  - Normal LV size with moderate LV hypertrophy. EF 55%. Mildly   dilated RV with normal systolic function. Bioprosthetic aortic   valve s/p TAVR, no significant stenosis or regurgitation. Mild   pulmonary hypertension.    ASSESSMENT:    1. Permanent atrial fibrillation   2. Coronary artery disease involving coronary bypass graft of native heart without angina pectoris   3. Bilateral carotid artery stenosis   4. Essential hypertension   5. Hyperlipidemia, unspecified hyperlipidemia type   6. S/p TAVR (transcatheter aortic valve replacement), bioprosthetic   7. Pacemaker   8. H/O: CVA (cerebrovascular accident)      PLAN:  In order of problems listed above:  1. Permanent atrial fibrillation: Heart rate well controlled on current therapy.  2. CAD s/p CABG: Denies any recent chest pain.  Last cardiac catheterization was on 03/15/2017 which showed severe native three-vessel disease with patent LIMA to LAD, patent free radial  graft to OM with tight stenosis at the distal anastomosis involving a small caliber vessel, patent SVG to PDA.  The need for Eliquis  3. Severe aortic stenosis s/p TAVR: Aspirin was stopped in October.  Last echocardiogram in May 2019 showed stable TAVR anatomy  4. Hypertension: Blood pressure elevated, will add hydralazine 25 mg twice daily  5. Hyperlipidemia: History of statin intolerance, currently able to take very low dose of Crestor 10 mg daily  6. History of high-grade conduction disease s/p dual-chamber pacemaker: Widened QRS seen on EKG likely related to pacing induced dyssynchrony.  7. History of CVA: No recurrence  8. Carotid artery disease: Last carotid ultrasound in December 2018.  Less than 50% disease on the left.  Patent carotid enterectomy sites.  Proximal right common carotid artery velocities were elevated with stenotic right subclavian velocity.  Due for repeat on follow-up    Medication Adjustments/Labs and Tests Ordered: Current medicines are reviewed at length with the patient today.  Concerns regarding medicines are outlined above.  Medication changes, Labs and Tests ordered today are listed in the Patient Instructions below. Patient Instructions  Medication Instructions:  START Hydralazine 25mg  take 1 tablet twice a day If you need a refill on your cardiac medications before your next appointment, please call your pharmacy.   Lab work: None  If you have labs (blood work) drawn today and your tests are completely normal, you will receive your results only by: Marland Kitchen MyChart Message (if you have  MyChart) OR . A paper copy in the mail If you have any lab test that is abnormal or we need to change your treatment, we will call you to review the results.  Testing/Procedures: None   Follow-Up: At [facility], you and your health needs are our priority.  As part of our continuing mission to provide you with exceptional heart care, we have created designated Provider  Care Teams.  These Care Teams include your primary Cardiologist (physician) and Advanced Practice Providers (APPs -  Physician Assistants and Nurse Practitioners) who all work together to provide you with the care you need, when you need it. . Your physician recommends that you schedule a follow-up appointment in: 3-4 months with Dr [name]  Any Other Special Instructions Will Be Listed Below (If Applicable). Cleared for upcoming Cataract surgery     Evan [name], [place]  12/16/2017 11:19 PM    [facility] [address], [place], [place]  [address] Phone: [phone]; Fax: [phone]

## 2017-12-16 ENCOUNTER — Encounter: Payer: Self-pay | Admitting: Physician Assistant

## 2017-12-19 ENCOUNTER — Ambulatory Visit (INDEPENDENT_AMBULATORY_CARE_PROVIDER_SITE_OTHER): Payer: Medicare Other

## 2017-12-19 DIAGNOSIS — I4819 Other persistent atrial fibrillation: Secondary | ICD-10-CM | POA: Diagnosis not present

## 2017-12-19 NOTE — Progress Notes (Signed)
Remote pacemaker transmission.   

## 2017-12-25 ENCOUNTER — Telehealth: Payer: Self-pay

## 2017-12-25 NOTE — Telephone Encounter (Signed)
Spoke to patient's wife Eliquis 5 mg samples left at [facility] office front desk. 

## 2017-12-26 ENCOUNTER — Ambulatory Visit: Payer: Medicare Other | Admitting: Physician Assistant

## 2018-01-05 NOTE — Progress Notes (Signed)
Cardiology Office Note    Date:  01/09/2018   ID:  Evan [name], DOB October 06, 1940, MRN [id number]  PCP:  Katheren Shams  Cardiologist: Dr. [name] Electrophysiologist Dr. Curt Bears PV specialist: Dr. Burt Knack  Chief Complaint  Patient presents with  . Follow-up  . Shortness of Breath    History of Present Illness:  Evan [name] is a 77 y.o. male seen for follow up CAD and prior TAVR. He has a  past medical history of CAD s/p CABG 2005, hypertension, hyperlipidemia, carotid artery disease, permanent atrial fibrillation, severe aortic stenosis s/p TAVR, DM 2, and pacemaker.  Cardiac catheterization in 2007 demonstrated patent grafts.  Myoview in 2013 showed no significant abnormality.  He had left carotid enterectomy in May 2014.  He is also intolerant to statins due to severe myalgia and weakness.  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  Sleep study in October 2017 only showed borderline sleep apnea with minimal desaturation.    Follow-up echocardiogram obtained on 03/01/2017 showed the patient's aortic stenosis has significantly progressed since 2017.  Dr. Curt Bears recommended permanent pacemaker placement due to severe conduction disease and this was placed at the end of February. He eventually had [employer] Jude MRI compatible dual-chamber pacemaker implanted on 03/23/2017.  He was evaluated by Dr. Burt Knack for TAVR and eventually underwent the procedure with Dr. Cyndia Bent and Dr. Burt Knack with placement of Edwards Sapien 3 THV size 26 mm bioprosthetic valve on 05/09/17.  He had no complications. Follow up Echo in May showed a good result. ASA was discontinued in November. On Eliquis.  Since last visit he had cataract surgery. Seen by Almyra Deforest PA-C in November and BP elevated. Hydralazine added for BP control.   He is seen today. He notes  increased dyspnea on exertion walking to his car or up hill. Some LE edema. Weight has increased 4 lbs since the summer. No chest pain or palpitations. Notes swelling goes down at night but does have nocturia.     Past Medical History:  Diagnosis Date  . Anemia   . Carotid arterial disease ([place])   . CHF (congestive heart failure) ([place])   . Coronary artery disease   . HOH (hard of hearing)   . Hyperlipidemia   . Hypertension   . Nocturia   . Obesity   . Osteoarthritis    "BUE; BLE; right knee" (03/23/2017)  . Peripheral vision loss, bilateral    S/P 07/2015  . Permanent atrial fibrillation   . 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 Severe aortic stenosis   . Sinus drainage   . Stroke [facility]) 07/2015   "peripheral vision still bad out of left eye" (03/23/2017)  . Torn rotator cuff 2012   Right arm  . Type II diabetes mellitus ([place])   . Urinary frequency     Past Surgical History:  Procedure Laterality Date  . BACK SURGERY    . CARDIAC CATHETERIZATION  11/29/2005   SEVERE THREE VESSEL OBSTRUCTIVE ATHERSCLEROTIC CAD. ALL GRAFTS WERE PATENT. EF 65%  . CARDIAC CATHETERIZATION  03/23/2017  . CAROTID ENDARTERECTOMY Right 01-29-08   cea  . CARPAL TUNNEL RELEASE Right   . CORONARY ARTERY BYPASS GRAFT  03/2003   X3. LIMA GRAFT TO THE LAD, SAPHENOUS VEIN GRAFT TO THE PA, AND A LEFT RADIAL GRAFT TO THEOBTUSE MARGINAL VESSEL  .  ENDARTERECTOMY Left 05/28/2012   Procedure: ENDARTERECTOMY CAROTID;  Surgeon: Elam Dutch, MD;  Location: [facility];  Service: Vascular;  Laterality: Left;  . INSERT / REPLACE / REMOVE PACEMAKER  03/23/2017  . [place]; 1984  . MOLE SURGERY     "? face/arms"  . PACEMAKER IMPLANT N/A 03/23/2017   Procedure: PACEMAKER IMPLANT;  Surgeon: Constance Haw, MD;  Location: [facility] CV [facility];  Service: Cardiovascular;  Laterality: N/A;  . PATCH ANGIOPLASTY Left 05/28/2012   Procedure: WITH dACRON PATCH ANGIOPLASTY;  Surgeon: Elam Dutch, MD;  Location: [facility];  Service: Vascular;  Laterality: Left;  . RIGHT/LEFT HEART CATH AND CORONARY/GRAFT ANGIOGRAPHY N/A 03/23/2017   Procedure: RIGHT/LEFT HEART CATH AND CORONARY/GRAFT ANGIOGRAPHY;  Surgeon: Sherren Mocha, MD;  Location: [place] [place] [facility];  Service: Cardiovascular;  Laterality: N/A;  . TEE WITHOUT CARDIOVERSION N/A 05/09/2017   Procedure: TRANSESOPHAGEAL ECHOCARDIOGRAM (TEE);  Surgeon: Sherren Mocha, MD;  Location: [facility];  Service: Open Heart Surgery;  Laterality: N/A;  . TRANSCATHETER AORTIC VALVE REPLACEMENT, TRANSFEMORAL N/A 05/09/2017   Procedure: TRANSCATHETER AORTIC VALVE REPLACEMENT, TRANSFEMORAL using an Edwards 4mm Sapien 3 Aortic Valve;  Surgeon: Sherren Mocha, MD;  Location: [facility];  Service: Open Heart Surgery;  Laterality: N/A;    Current Medications: Outpatient Medications Prior to Visit  Medication Sig Dispense Refill  . acetaminophen (TYLENOL) 650 MG CR tablet Take 1,300 mg by mouth every 8 (eight) hours as needed for pain.     Marland Kitchen amLODipine (NORVASC) 5 MG tablet Take 1 tablet (5 mg total) by mouth daily. 90 tablet 3  . apixaban (ELIQUIS) 5 MG TABS tablet Take 1 tablet (5 mg total) by mouth 2 (two) times daily. 28 tablet 0  . benazepril (LOTENSIN) 40 MG tablet Take 1 tablet (40 mg total) by mouth daily. 90 tablet 3  . Camphor-Menthol-Methyl Sal (MUSCLE RUB ULTRA STRENGTH) 05-03-28 % CREA Apply 1 application topically 3 (three) times daily as needed (muscle pain).    . Cyanocobalamin (RA VITAMIN B-12 TR) 1000 MCG TBCR Take 1,000 mcg by mouth daily.     Marland Kitchen ezetimibe (ZETIA) 10 MG tablet Take 1 tablet (10 mg total) by mouth daily. (Patient taking differently: Take 10 mg by mouth every evening. ) 30 tablet 11  . fluticasone (FLONASE) 50 MCG/ACT nasal spray Place 2 sprays into both nostrils daily.    Marland Kitchen glimepiride (AMARYL) 2 MG tablet Take 2 mg by mouth daily with breakfast.    . guaiFENesin-codeine 100-10 MG/5ML syrup Take 5 mLs by mouth every 6 (six) hours  as needed for cough.     . hydrALAZINE (APRESOLINE) 25 MG tablet Take 1 tablet (25 mg total) by mouth 2 (two) times daily. 60 tablet 3  . hydrochlorothiazide (HYDRODIURIL) 25 MG tablet Take 1 tablet (25 mg total) by mouth every morning. 90 tablet 1  . OVER THE COUNTER MEDICATION Replenex tablets: Take 3 tablet by mouth every morning (joint health)    . Polyvinyl Alcohol-Povidone (REFRESH OP) Place 2 drops into both eyes daily as needed (for dry eyes).     . rosuvastatin (CRESTOR) 10 MG tablet Take 1 tablet (10 mg total) by mouth daily. 90 tablet 3  . tamsulosin (FLOMAX) 0.4 MG CAPS capsule TAKE 1 CAPSULE BY MOUTH ONCE DAILY (Patient taking differently: TAKE 0.4 MG BY MOUTH ONCE DAILY IN THE EVENING) 90 capsule 3   No facility-administered medications prior to visit.      Allergies:   Niacin and  related; Metformin and related; and Statins   Social History   Socioeconomic History  . Marital status: Married    Spouse name: Not on file  . Number of children: 5  . Years of education: Not on file  . Highest education level: Not on file  Occupational History  . Occupation: retired    Fish farm manager: RETIRED  Social Needs  . Financial resource strain: Not on file  . Food insecurity:    Worry: Not on file    Inability: Not on file  . Transportation needs:    Medical: Not on file    Non-medical: Not on file  Tobacco Use  . Smoking status: Former Smoker    Packs/day: 2.00    Years: 39.00    Pack years: 78.00    Types: Cigarettes    Last attempt to quit: 01/24/1994    Years since quitting: 23.9  . Smokeless tobacco: Never Used  Substance and Sexual Activity  . Alcohol use: No  . Drug use: No  . Sexual activity: Never  Lifestyle  . Physical activity:    Days per week: Not on file    Minutes per session: Not on file  . Stress: Not on file  Relationships  . Social connections:    Talks on phone: Not on file    Gets together: Not on file    Attends religious service: Not on file     Active member of club or organization: Not on file    Attends meetings of clubs or organizations: Not on file    Relationship status: Not on file  Other Topics Concern  . Not on file  Social History Narrative  . Not on file     Family History:  The patient's family history includes Arrhythmia in his brother; Cancer in his brother; Heart attack in his brother and father; Parkinsonism in his brother.   ROS:   Please see the history of present illness.    ROS All other systems reviewed and are negative.   PHYSICAL EXAM:   VS:  BP (!) 146/52   Pulse 60   Ht 6' (1.829 m)   Wt 270 lb 6.4 oz (122.7 kg)   SpO2 93%   BMI 36.67 kg/m    GEN: Well nourished, well developed, in no acute distress  HEENT: normal  Neck: no JVD, carotid bruits, or masses Cardiac: RRR; gr 1/6 SEM, rubs, or gallops, 1+ edema  +pacemaker present Respiratory:  clear to auscultation bilaterally, normal work of breathing GI: soft, nontender, nondistended, + BS MS: no deformity or atrophy  Skin: warm and dry, no rash Neuro:  Alert and Oriented x 3, Strength and sensation are intact Psych: euthymic mood, full affect  Wt Readings from Last 3 Encounters:  01/09/18 270 lb 6.4 oz (122.7 kg)  12/14/17 269 lb (122 kg)  09/22/17 266 lb 12.8 oz (121 kg)      Studies/Labs Reviewed:   EKG:  EKG is not ordered today.   Recent Labs: 02/07/2017: TSH 0.855 05/05/2017: ALT 20; B Natriuretic Peptide 280.0 05/10/2017: BUN 18; Creatinine, Ser 1.14; Hemoglobin 10.8; Magnesium 2.0; Platelets 188; Potassium 4.8; Sodium 137   Lipid Panel    Component Value Date/Time   CHOL 116 02/07/2017 1516   TRIG 81 02/07/2017 1516   HDL 48 02/07/2017 1516   CHOLHDL 2.3 12/11/2015 0854   VLDL 12 12/11/2015 0854   LDLCALC 52 02/07/2017 1516   Labs dated 07/31/17; Hgb 10.6. Glucose 137. Otherwise CMET normal. Cholesterol  110, triglycerides 74, HDL 49, LDL 46.   Additional studies/ records that were reviewed today include:   Cath  03/23/2017 Conclusion     Mid RCA lesion is 100% stenosed.  Ost LM to Dist LM lesion is 70% stenosed.  Prox Cx lesion is 90% stenosed.  Ost 2nd Mrg lesion is 100% stenosed.  Prox LAD-1 lesion is 90% stenosed.  Prox LAD-2 lesion is 100% stenosed.  LIMA and is normal in caliber.  The graft exhibits no disease.  SVG graft was visualized by angiography and is normal in caliber.  Left radial artery graft was visualized by angiography.   1.  Severe native three-vessel coronary artery disease with total occlusion of the RCA, severe left main stenosis, total occlusion of the LAD with severe calcification, and severe stenosis of the circumflex and its branch vessels.  2.  Status post aortocoronary bypass surgery with continued patency of the LIMA to LAD, continued patency of the free radial graft to OM with tight stenosis at the distal anastomosis involving a small caliber vessel, and continued patency of the saphenous vein graft to PDA without any significant disease.  3.  Severe aortic valve stenosis with a mean gradient of 40 mmHg.  The calculated valve area is 1.74 cm likely related to high cardiac output  4.  Moderate pulmonary hypertension with a PA pressure of 72/25 with a mean of 38   Recommendations: The patient will undergo permanent pacemaker placement for symptomatic atrial fibrillation with slow ventricular rate.  He will continue to undergo evaluation by the multidisciplinary heart team for consideration of treatment options for his severe aortic stenosis.      Limited echo 05/10/2017 LV EF: 60% -   65% Study Conclusions  - Left ventricle: The cavity size was normal. Systolic function was   normal. The estimated ejection fraction was in the range of 60%   to 65%. Wall motion was normal; there were no regional wall   motion abnormalities. - Ventricular septum: The contour showed diastolic flattening. - Aortic valve: A TAVR bioprosthesis was present. There  was no   regurgitation. Peak velocity (S): 207 cm/s. Mean gradient (S): 7   mm Hg. Valve area (VTI): 1.65 cm^2. Valve area (Vmax): 1.44 cm^2.   Valve area (Vmean): 1.77 cm^2. - Mitral valve: Moderately calcified annulus. - Pulmonary arteries: Systolic pressure was moderately to severely   increased. PA peak pressure: 65 mm Hg (S).  Echo 06/14/17:Study Conclusions  - Left ventricle: The cavity size was normal. Wall thickness was   increased in a pattern of moderate LVH. Indeterminant diastolic   function. The estimated ejection fraction was 55%. Although no   diagnostic regional wall motion abnormality was identified, this   possibility cannot be completely excluded on the basis of this   study. - Aortic valve: Bioprosthetic aortic valve s/p TAVR. There was no   stenosis. There was no regurgitation. Mean gradient (S): 12 mm   Hg. - Mitral valve: Moderately calcified annulus. - Left atrium: The atrium was severely dilated. - Right ventricle: The cavity size was mildly dilated. Pacer wire   or catheter noted in right ventricle. Systolic function was   normal. - Right atrium: The atrium was mildly dilated. - Tricuspid valve: Peak RV-RA gradient (S): 36 mm Hg. - Pulmonary arteries: PA peak pressure: 39 mm Hg (S). - Inferior vena cava: The vessel was normal in size. The   respirophasic diameter changes were in the normal range (>= 50%),   consistent with  normal central venous pressure.  Impressions:  - Normal LV size with moderate LV hypertrophy. EF 55%. Mildly   dilated RV with normal systolic function. Bioprosthetic aortic   valve s/p TAVR, no significant stenosis or regurgitation. Mild   pulmonary hypertension.      ASSESSMENT:    1. Dyspnea on exertion   2. Coronary artery disease involving coronary bypass graft of native heart without angina pectoris   3. Permanent atrial fibrillation   4. Bilateral carotid artery stenosis   5. S/p TAVR (transcatheter aortic valve  replacement), bioprosthetic   6. Pacemaker   7. H/O: CVA (cerebrovascular accident)      PLAN:  In order of problems listed above:  1. CAD s/p CABG: Patient underwent a cardiac catheterization in February 2019, grafts still patent. Continued medical therapy was recommended.    2. Severe aortic stenosis s/p TAVR in April 2019: Echo in May showed good valve function. On Eliquis. ASA discontinued.   3. History of permanent atrial fibrillation with slow ventricular rate: S/p pacemaker.  On Eliquis 5 mg twice daily.  4.   Volume overload due to diastolic dysfunction. Will add lasix 20 mg daily. Restrict sodium intake. Check labs in 2-3 weeks. Follow up in 3 months.  4. Hypertension: BP control has improved with addition of hydralazine. Will also add lasix today as noted above.   5. Hyperlipidemia: on Crestor with excellent lipids. With leg pain will reduce dose to 10 mg daily. Repeat lipids with upcoming lab work  6. Carotid artery disease: History of left carotid endarterectomy in 2014.  Stable on last ultrasound 01/10/2017.  7.   S/p right PCA CVA with visual cut.     Medication Adjustments/Labs and Tests Ordered: Current medicines are reviewed at length with the patient today.  Concerns regarding medicines are outlined above.  Medication changes, Labs and Tests ordered today are listed in the Patient Instructions below. Patient Instructions  Add lasix 20 mg daily for fluid  We will check lab work in 2-3 weeks.      Signed, Nester Bachus [name], MD  01/09/2018 9:43 AM    [facility] [address], [place], [place]  [address] Phone: [phone]; Fax: [phone]

## 2018-01-06 ENCOUNTER — Other Ambulatory Visit: Payer: Self-pay | Admitting: Cardiology

## 2018-01-09 ENCOUNTER — Ambulatory Visit (INDEPENDENT_AMBULATORY_CARE_PROVIDER_SITE_OTHER): Payer: Medicare Other | Admitting: Cardiology

## 2018-01-09 ENCOUNTER — Other Ambulatory Visit: Payer: Self-pay

## 2018-01-09 ENCOUNTER — Encounter: Payer: Self-pay | Admitting: Cardiology

## 2018-01-09 VITALS — BP 146/52 | HR 60 | Ht 72.0 in | Wt 270.4 lb

## 2018-01-09 DIAGNOSIS — Z953 Presence of xenogenic heart valve: Secondary | ICD-10-CM

## 2018-01-09 DIAGNOSIS — R0609 Other forms of dyspnea: Secondary | ICD-10-CM

## 2018-01-09 DIAGNOSIS — Z950 Presence of cardiac pacemaker: Secondary | ICD-10-CM

## 2018-01-09 DIAGNOSIS — I6523 Occlusion and stenosis of bilateral carotid arteries: Secondary | ICD-10-CM | POA: Diagnosis not present

## 2018-01-09 DIAGNOSIS — I4821 Permanent atrial fibrillation: Secondary | ICD-10-CM

## 2018-01-09 DIAGNOSIS — Z8673 Personal history of transient ischemic attack (TIA), and cerebral infarction without residual deficits: Secondary | ICD-10-CM

## 2018-01-09 DIAGNOSIS — I25810 Atherosclerosis of coronary artery bypass graft(s) without angina pectoris: Secondary | ICD-10-CM | POA: Diagnosis not present

## 2018-01-09 MED ORDER — FUROSEMIDE 20 MG PO TABS: 20.0000 mg | ORAL_TABLET | Freq: Every day | ORAL | 3 refills | Status: DC

## 2018-01-09 MED ORDER — BENAZEPRIL HCL 40 MG PO TABS: 40.0000 mg | ORAL_TABLET | Freq: Every day | ORAL | 11 refills | Status: DC

## 2018-01-09 NOTE — Patient Instructions (Signed)
Add lasix 20 mg daily for fluid  We will check lab work in 2-3 weeks.

## 2018-01-25 ENCOUNTER — Other Ambulatory Visit: Payer: Self-pay | Admitting: Cardiology

## 2018-01-25 ENCOUNTER — Other Ambulatory Visit: Payer: Self-pay

## 2018-01-25 MED ORDER — GLIMEPIRIDE 2 MG PO TABS: ORAL_TABLET | ORAL | 0 refills | Status: AC

## 2018-01-29 ENCOUNTER — Other Ambulatory Visit: Payer: Medicare Other

## 2018-01-30 ENCOUNTER — Other Ambulatory Visit: Payer: Self-pay

## 2018-01-30 DIAGNOSIS — I4821 Permanent atrial fibrillation: Secondary | ICD-10-CM

## 2018-01-30 DIAGNOSIS — I25810 Atherosclerosis of coronary artery bypass graft(s) without angina pectoris: Secondary | ICD-10-CM

## 2018-01-30 DIAGNOSIS — D649 Anemia, unspecified: Secondary | ICD-10-CM

## 2018-01-30 DIAGNOSIS — I10 Essential (primary) hypertension: Secondary | ICD-10-CM

## 2018-01-30 LAB — CBC WITH DIFFERENTIAL/PLATELET
Basophils Absolute: 0 10*3/uL (ref 0.0–0.2)
Basos: 1 %
EOS (ABSOLUTE): 0.2 10*3/uL (ref 0.0–0.4)
EOS: 3 %
HEMATOCRIT: 28.4 % — AB (ref 37.5–51.0)
HEMOGLOBIN: 9.3 g/dL — AB (ref 13.0–17.7)
IMMATURE GRANS (ABS): 0 10*3/uL (ref 0.0–0.1)
Immature Granulocytes: 0 %
LYMPHS ABS: 0.9 10*3/uL (ref 0.7–3.1)
Lymphs: 18 %
MCH: 32.5 pg (ref 26.6–33.0)
MCHC: 32.7 g/dL (ref 31.5–35.7)
MCV: 99 fL — ABNORMAL HIGH (ref 79–97)
MONOCYTES: 8 %
Monocytes Absolute: 0.4 10*3/uL (ref 0.1–0.9)
Neutrophils Absolute: 3.5 10*3/uL (ref 1.4–7.0)
Neutrophils: 70 %
Platelets: 210 10*3/uL (ref 150–450)
RBC: 2.86 x10E6/uL — ABNORMAL LOW (ref 4.14–5.80)
RDW: 14 % (ref 11.6–15.4)
WBC: 4.9 10*3/uL (ref 3.4–10.8)

## 2018-01-30 LAB — COMPREHENSIVE METABOLIC PANEL
A/G RATIO: 2 (ref 1.2–2.2)
ALK PHOS: 76 IU/L (ref 39–117)
ALT: 15 IU/L (ref 0–44)
AST: 18 IU/L (ref 0–40)
Albumin: 4.3 g/dL (ref 3.5–4.8)
BILIRUBIN TOTAL: 0.7 mg/dL (ref 0.0–1.2)
BUN/Creatinine Ratio: 18 (ref 10–24)
BUN: 25 mg/dL (ref 8–27)
CHLORIDE: 100 mmol/L (ref 96–106)
CO2: 22 mmol/L (ref 20–29)
Calcium: 9.2 mg/dL (ref 8.6–10.2)
Creatinine, Ser: 1.37 mg/dL — ABNORMAL HIGH (ref 0.76–1.27)
GFR calc non Af Amer: 50 mL/min/{1.73_m2} — ABNORMAL LOW (ref >59)
GFR, EST AFRICAN AMERICAN: 58 mL/min/{1.73_m2} — AB (ref >59)
Globulin, Total: 2.1 g/dL (ref 1.5–4.5)
Glucose: 169 mg/dL — ABNORMAL HIGH (ref 65–99)
POTASSIUM: 4.4 mmol/L (ref 3.5–5.2)
Sodium: 139 mmol/L (ref 134–144)
Total Protein: 6.4 g/dL (ref 6.0–8.5)

## 2018-01-30 LAB — LIPID PANEL
Chol/HDL Ratio: 2.3 ratio (ref 0.0–5.0)
Cholesterol, Total: 104 mg/dL (ref 100–199)
HDL: 45 mg/dL (ref >39)
LDL Calculated: 44 mg/dL (ref 0–99)
TRIGLYCERIDES: 75 mg/dL (ref 0–149)
VLDL Cholesterol Cal: 15 mg/dL (ref 5–40)

## 2018-02-09 ENCOUNTER — Telehealth: Payer: Self-pay

## 2018-02-09 LAB — CUP PACEART REMOTE DEVICE CHECK
Battery Remaining Longevity: 136 mo
Battery Remaining Percentage: 95.5 %
Battery Voltage: 3.01 V
Brady Statistic RV Percent Paced: 94 %
Date Time Interrogation Session: 20191126070018
Implantable Lead Implant Date: 20190228
Implantable Lead Location: 753860
Implantable Pulse Generator Implant Date: 20190228
Lead Channel Impedance Value: 480 Ohm
Lead Channel Pacing Threshold Amplitude: 0.625 V
Lead Channel Pacing Threshold Pulse Width: 0.5 ms
Lead Channel Sensing Intrinsic Amplitude: 11 mV
Lead Channel Setting Pacing Amplitude: 0.875
Lead Channel Setting Pacing Pulse Width: 0.5 ms
Lead Channel Setting Sensing Sensitivity: 2 mV
Pulse Gen Model: 1272
Pulse Gen Serial Number: 8971140

## 2018-02-09 NOTE — Telephone Encounter (Signed)
-----   Message from Theodoro Parma, RN sent at 06/14/2017  3:30 PM EDT ----- Regarding: 1 year TAVR KT ov/same day echo due 04/09/2018 TAVR completed 05/09/2017

## 2018-02-09 NOTE — Telephone Encounter (Signed)
Spoke to patient's wife 02/05/18 she stated patient is having leg pain.Dr.Jordan advised to hold crestor.Advised to call back in 3 to 4 weeks to let us know if leg pain better.Wife stated she will call back and let us know.

## 2018-02-09 NOTE — Telephone Encounter (Signed)
TAVR completed 05/09/2017. Spoke with the patient's wife and scheduled patient for echo and OV with Nell Range 05/09/18. She was grateful for call and agrees with treatment plan.

## 2018-02-23 ENCOUNTER — Other Ambulatory Visit: Payer: Self-pay | Admitting: Cardiology

## 2018-02-23 NOTE — Telephone Encounter (Signed)
Rx(s) sent to pharmacy electronically.  

## 2018-02-27 ENCOUNTER — Telehealth: Payer: Self-pay | Admitting: Cardiology

## 2018-02-27 NOTE — Telephone Encounter (Signed)
New Message   Patient's wife calling for lab results

## 2018-02-27 NOTE — Telephone Encounter (Signed)
Spoke with pt wife, the patient has been off rosuvastatin for about 1 month now and the patient legs are doing better with no muscle pain as before. She also reports the patient had lab work done recently at dr Amaryllis Dyke at [facility] and they were supposed to check his B12. She wants to make sure we are not checking something they have already checked. She also would like to have his cholesterol checked to make sure it is alright. They are coming Friday for lab work. Will make cheryl aware.

## 2018-02-28 NOTE — Telephone Encounter (Signed)
Spoke to patient's wife.Advised we received labs from PCP and a B12 level was done.Advised to have lab done at our [facility] office as planned advised he will not need a B12.I will let Dr.Jordan know his leg pain is better since he stopped rosuvastatin.

## 2018-03-02 ENCOUNTER — Other Ambulatory Visit: Payer: Medicare Other | Admitting: *Deleted

## 2018-03-02 DIAGNOSIS — I4821 Permanent atrial fibrillation: Secondary | ICD-10-CM

## 2018-03-02 DIAGNOSIS — D649 Anemia, unspecified: Secondary | ICD-10-CM

## 2018-03-02 DIAGNOSIS — I10 Essential (primary) hypertension: Secondary | ICD-10-CM

## 2018-03-02 DIAGNOSIS — I25810 Atherosclerosis of coronary artery bypass graft(s) without angina pectoris: Secondary | ICD-10-CM

## 2018-03-03 LAB — IRON AND TIBC
IRON SATURATION: 42 % (ref 15–55)
Iron: 138 ug/dL (ref 38–169)
Total Iron Binding Capacity: 326 ug/dL (ref 250–450)
UIBC: 188 ug/dL (ref 111–343)

## 2018-03-03 LAB — CBC WITH DIFFERENTIAL/PLATELET
Basophils Absolute: 0 10*3/uL (ref 0.0–0.2)
Basos: 1 %
EOS (ABSOLUTE): 0.2 10*3/uL (ref 0.0–0.4)
EOS: 3 %
Hematocrit: 31.1 % — ABNORMAL LOW (ref 37.5–51.0)
Hemoglobin: 10 g/dL — ABNORMAL LOW (ref 13.0–17.7)
Immature Grans (Abs): 0 10*3/uL (ref 0.0–0.1)
Immature Granulocytes: 0 %
Lymphocytes Absolute: 1 10*3/uL (ref 0.7–3.1)
Lymphs: 18 %
MCH: 31.5 pg (ref 26.6–33.0)
MCHC: 32.2 g/dL (ref 31.5–35.7)
MCV: 98 fL — ABNORMAL HIGH (ref 79–97)
Monocytes Absolute: 0.4 10*3/uL (ref 0.1–0.9)
Monocytes: 7 %
Neutrophils Absolute: 3.9 10*3/uL (ref 1.4–7.0)
Neutrophils: 71 %
Platelets: 253 10*3/uL (ref 150–450)
RBC: 3.17 x10E6/uL — ABNORMAL LOW (ref 4.14–5.80)
RDW: 13.6 % (ref 11.6–15.4)
WBC: 5.5 10*3/uL (ref 3.4–10.8)

## 2018-03-03 LAB — BASIC METABOLIC PANEL
BUN/Creatinine Ratio: 15 (ref 10–24)
BUN: 22 mg/dL (ref 8–27)
CO2: 21 mmol/L (ref 20–29)
Calcium: 9.4 mg/dL (ref 8.6–10.2)
Chloride: 99 mmol/L (ref 96–106)
Creatinine, Ser: 1.46 mg/dL — ABNORMAL HIGH (ref 0.76–1.27)
GFR calc Af Amer: 53 mL/min/{1.73_m2} — ABNORMAL LOW (ref >59)
GFR calc non Af Amer: 46 mL/min/{1.73_m2} — ABNORMAL LOW (ref >59)
Glucose: 178 mg/dL — ABNORMAL HIGH (ref 65–99)
Potassium: 4.8 mmol/L (ref 3.5–5.2)
Sodium: 137 mmol/L (ref 134–144)

## 2018-03-03 LAB — FOLATE: Folate: 9.8 ng/mL (ref >3.0)

## 2018-03-03 LAB — FERRITIN: Ferritin: 45 ng/mL (ref 30–400)

## 2018-03-05 ENCOUNTER — Other Ambulatory Visit: Payer: Self-pay

## 2018-03-05 DIAGNOSIS — I25810 Atherosclerosis of coronary artery bypass graft(s) without angina pectoris: Secondary | ICD-10-CM

## 2018-03-05 DIAGNOSIS — E785 Hyperlipidemia, unspecified: Secondary | ICD-10-CM

## 2018-03-05 DIAGNOSIS — I4821 Permanent atrial fibrillation: Secondary | ICD-10-CM

## 2018-03-05 NOTE — Telephone Encounter (Signed)
Spoke to patient's wife lab results given.Dr.Jordan advised take Zetia 10 mg daily. Stated husband already taking generic Zetia 10 mg daily.Advised to continue.Repeat fasting lab bmet,lipid,hepatic panels in 3 months.

## 2018-03-16 ENCOUNTER — Telehealth: Payer: Self-pay | Admitting: Cardiology

## 2018-03-16 ENCOUNTER — Telehealth: Payer: Self-pay

## 2018-03-16 NOTE — Telephone Encounter (Signed)
Patient called concerning follow up with Dr.Coopers office, and patient needing 1 year follow up with ECHO, advised that appointment for ECHO was scheduled as well as appointment with NP.  Patient wife verbalized understanding.

## 2018-03-16 NOTE — Telephone Encounter (Signed)
Received message from scheduling that the patient will be out of town for his scheduled 1 year TAVR echo and OV.  Rescheduled him to 4/22 for both visits. DPR was grateful for call and agrees with treatment plan.

## 2018-03-16 NOTE — Telephone Encounter (Signed)
New message:   Patient wife calling about the labs please call patient back.

## 2018-03-20 ENCOUNTER — Ambulatory Visit (INDEPENDENT_AMBULATORY_CARE_PROVIDER_SITE_OTHER): Payer: Medicare Other | Admitting: *Deleted

## 2018-03-20 DIAGNOSIS — I4821 Permanent atrial fibrillation: Secondary | ICD-10-CM

## 2018-03-21 LAB — CUP PACEART REMOTE DEVICE CHECK
Battery Remaining Longevity: 133 mo
Battery Remaining Percentage: 95.5 %
Battery Voltage: 3.01 V
Brady Statistic RV Percent Paced: 95 %
Date Time Interrogation Session: 20200224170538
Implantable Lead Implant Date: 20190228
Implantable Lead Location: 753860
Implantable Pulse Generator Implant Date: 20190228
Lead Channel Impedance Value: 480 Ohm
Lead Channel Pacing Threshold Amplitude: 0.5 V
Lead Channel Pacing Threshold Pulse Width: 0.5 ms
Lead Channel Sensing Intrinsic Amplitude: 12 mV
Lead Channel Setting Pacing Amplitude: 0.75 V
Lead Channel Setting Pacing Pulse Width: 0.5 ms
Lead Channel Setting Sensing Sensitivity: 2 mV
Pulse Gen Model: 1272
Pulse Gen Serial Number: 8971140

## 2018-03-27 ENCOUNTER — Encounter: Payer: Self-pay | Admitting: Cardiology

## 2018-03-27 ENCOUNTER — Ambulatory Visit (INDEPENDENT_AMBULATORY_CARE_PROVIDER_SITE_OTHER): Payer: Medicare Other | Admitting: Cardiology

## 2018-03-27 VITALS — BP 122/56 | HR 64 | Ht 72.0 in | Wt 269.0 lb

## 2018-03-27 DIAGNOSIS — I4821 Permanent atrial fibrillation: Secondary | ICD-10-CM | POA: Diagnosis not present

## 2018-03-27 DIAGNOSIS — I25810 Atherosclerosis of coronary artery bypass graft(s) without angina pectoris: Secondary | ICD-10-CM

## 2018-03-27 DIAGNOSIS — Z87891 Personal history of nicotine dependence: Secondary | ICD-10-CM

## 2018-03-27 DIAGNOSIS — R0602 Shortness of breath: Secondary | ICD-10-CM

## 2018-03-27 NOTE — Progress Notes (Signed)
Remote pacemaker transmission.   

## 2018-03-27 NOTE — Progress Notes (Signed)
Electrophysiology Office Note   Date:  03/27/2018   ID:  Lori, Liew 1940-04-10, MRN [id number]  PCP:  Kathlyn Sacramento, MD  Cardiologist:  [name] Primary Electrophysiologist:  Constance Haw, MD    No chief complaint on file.    History of Present Illness: Evan [name] is a 78 y.o. male who is being seen today for the evaluation of atrial fibrillation at the request of [name] [name]. Presenting today for electrophysiology evaluation.  Is a history of coronary artery disease and atrial fibrillation.  He is status post coronary artery bypass in 2005.  He had a cath in 2007 with patent grafts.  He underwent left carotid endarterectomy May 2014.  He has been intolerant of statins due to myalgias and weakness.  He has permanent atrial fibrillation.  His heart rate has been slow in the past and thus metoprolol has been stopped. He had a St. Jude pacemaker implanted 03/23/17.  Today, denies symptoms of palpitations, chest pain, orthopnea, PND, lower extremity edema, claudication, dizziness, presyncope, syncope, bleeding, or neurologic sequela. The patient is tolerating medications without difficulties.  His main complaint today is of shortness of breath.  This is been occurring for many months.  He has shortness of breath with exertion.  He feels well at rest.   Past Medical History:  Diagnosis Date  . Anemia   . Carotid arterial disease ([place])   . CHF (congestive heart failure) ([place])   . Coronary artery disease   . HOH (hard of hearing)   . Hyperlipidemia   . Hypertension   . Nocturia   . Obesity   . Osteoarthritis    "BUE; BLE; right knee" (03/23/2017)  . Peripheral vision loss, bilateral    S/P 07/2015  . Permanent atrial fibrillation   . 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 Severe aortic stenosis   . Sinus drainage   . Stroke [facility]) 07/2015   "peripheral vision still bad out of left eye" (03/23/2017)  . Torn rotator cuff 2012   Right arm  . Type II  diabetes mellitus ([place])   . Urinary frequency    Past Surgical History:  Procedure Laterality Date  . BACK SURGERY    . CARDIAC CATHETERIZATION  11/29/2005   SEVERE THREE VESSEL OBSTRUCTIVE ATHERSCLEROTIC CAD. ALL GRAFTS WERE PATENT. EF 65%  . CARDIAC CATHETERIZATION  03/23/2017  . CAROTID ENDARTERECTOMY Right 01-29-08   cea  . CARPAL TUNNEL RELEASE Right   . CORONARY ARTERY BYPASS GRAFT  03/2003   X3. LIMA GRAFT TO THE LAD, SAPHENOUS VEIN GRAFT TO THE PA, AND A LEFT RADIAL GRAFT TO THEOBTUSE MARGINAL VESSEL  . ENDARTERECTOMY Left 05/28/2012   Procedure: ENDARTERECTOMY CAROTID;  Surgeon: Elam Dutch, MD;  Location: [place];  Service: Vascular;  Laterality: Left;  . INSERT / REPLACE / REMOVE PACEMAKER  03/23/2017  . [place]; 1984  . MOLE SURGERY     "? face/arms"  . PACEMAKER IMPLANT N/A 03/23/2017   Procedure: PACEMAKER IMPLANT;  Surgeon: Constance Haw, MD;  Location: [place] CV LAB;  Service: Cardiovascular;  Laterality: N/A;  . PATCH ANGIOPLASTY Left 05/28/2012   Procedure: WITH dACRON PATCH ANGIOPLASTY;  Surgeon: Elam Dutch, MD;  Location: [place];  Service: Vascular;  Laterality: Left;  . RIGHT/LEFT HEART CATH AND CORONARY/GRAFT ANGIOGRAPHY N/A 03/23/2017   Procedure: RIGHT/LEFT HEART CATH AND CORONARY/GRAFT ANGIOGRAPHY;  Surgeon: Sherren Mocha, MD;  Location: [place] CV LAB;  Service: Cardiovascular;  Laterality: N/A;  . TEE WITHOUT CARDIOVERSION N/A 05/09/2017   Procedure: TRANSESOPHAGEAL ECHOCARDIOGRAM (TEE);  Surgeon: Sherren Mocha, MD;  Location: [place];  Service: Open Heart Surgery;  Laterality: N/A;  . TRANSCATHETER AORTIC VALVE REPLACEMENT, TRANSFEMORAL N/A 05/09/2017   Procedure: TRANSCATHETER AORTIC VALVE REPLACEMENT, TRANSFEMORAL using an Edwards 29mm Sapien 3 Aortic Valve;  Surgeon: Sherren Mocha, MD;  Location: [place];  Service: Open Heart Surgery;  Laterality: N/A;     Current Outpatient Medications  Medication Sig Dispense Refill    . acetaminophen (TYLENOL) 650 MG CR tablet Take 1,300 mg by mouth every 8 (eight) hours as needed for pain.     Marland Kitchen amLODipine (NORVASC) 5 MG tablet Take 1 tablet (5 mg total) by mouth daily. 90 tablet 3  . apixaban (ELIQUIS) 5 MG TABS tablet Take 1 tablet (5 mg total) by mouth 2 (two) times daily. 28 tablet 0  . benazepril (LOTENSIN) 40 MG tablet Take 1 tablet (40 mg total) by mouth daily. 90 tablet 0  . Camphor-Menthol-Methyl Sal (MUSCLE RUB ULTRA STRENGTH) 05-03-28 % CREA Apply 1 application topically 3 (three) times daily as needed (muscle pain).    . Cyanocobalamin (RA VITAMIN B-12 TR) 1000 MCG TBCR Take 1,000 mcg by mouth daily.     Marland Kitchen ezetimibe (ZETIA) 10 MG tablet Take 1 tablet (10 mg total) by mouth daily. 90 tablet 3  . fluticasone (FLONASE) 50 MCG/ACT nasal spray Place 2 sprays into both nostrils daily.    . furosemide (LASIX) 20 MG tablet Take 1 tablet (20 mg total) by mouth daily. 90 tablet 3  . glimepiride (AMARYL) 2 MG tablet TAKE 1 TABLET (2 MG TOTAL) BY MOUTH DAILY WITH BREAKFAST 90 tablet 0  . guaiFENesin-codeine 100-10 MG/5ML syrup Take 5 mLs by mouth every 6 (six) hours as needed for cough.     . hydrALAZINE (APRESOLINE) 25 MG tablet Take 1 tablet (25 mg total) by mouth 2 (two) times daily. 60 tablet 3  . hydrochlorothiazide (HYDRODIURIL) 25 MG tablet TAKE 1 TABLET BY MOUTH EVERY DAY IN THE MORNING 90 tablet 1  . OVER THE COUNTER MEDICATION Replenex tablets: Take 3 tablet by mouth every morning (joint health)    . Polyvinyl Alcohol-Povidone (REFRESH OP) Place 2 drops into both eyes daily as needed (for dry eyes).     . tamsulosin (FLOMAX) 0.4 MG CAPS capsule TAKE 1 CAPSULE BY MOUTH ONCE DAILY (Patient taking differently: TAKE 0.4 MG BY MOUTH ONCE DAILY IN THE EVENING) 90 capsule 3   No current facility-administered medications for this visit.     Allergies:   Niacin and related; Metformin and related; and Statins   Social History:  The patient  reports that he quit smoking  about 24 years ago. His smoking use included cigarettes. He has a 78.00 pack-year smoking history. He has never used smokeless tobacco. He reports that he does not drink alcohol or use drugs.   Family History:  The patient's family history includes Arrhythmia in his brother; Cancer in his brother; Heart attack in his brother and father; Parkinsonism in his brother.    ROS:  Please see the history of present illness.   Otherwise, review of systems is positive for shortness of breath.   All other systems are reviewed and negative.   PHYSICAL EXAM: VS:  BP (!) 122/56   Pulse 64   Ht 6' (1.829 m)   Wt 269 lb (122 kg)   BMI 36.48 kg/m  , BMI Body mass index is 36.48  kg/m. GEN: Well nourished, well developed, in no acute distress  HEENT: normal  Neck: no JVD, carotid bruits, or masses Cardiac: RRR; no murmurs, rubs, or gallops,no edema  Respiratory:  clear to auscultation bilaterally, normal work of breathing GI: soft, nontender, nondistended, + BS MS: no deformity or atrophy  Skin: warm and dry, device site well healed Neuro:  Strength and sensation are intact Psych: euthymic mood, full affect  EKG:  EKG is not ordered today. Personal review of the ekg ordered 12/14/17 shows AF, V paced  Personal review of the device interrogation today. Results in [place]: 05/05/2017: B Natriuretic Peptide 280.0 05/10/2017: Magnesium 2.0 01/29/2018: ALT 15 03/02/2018: BUN 22; Creatinine, Ser 1.46; Hemoglobin 10.0; Platelets 253; Potassium 4.8; Sodium 137    Lipid Panel     Component Value Date/Time   CHOL 104 01/29/2018 0000   TRIG 75 01/29/2018 0000   HDL 45 01/29/2018 0000   CHOLHDL 2.3 01/29/2018 0000   CHOLHDL 2.3 12/11/2015 0854   VLDL 12 12/11/2015 0854   LDLCALC 44 01/29/2018 0000     Wt Readings from Last 3 Encounters:  03/27/18 269 lb (122 kg)  01/09/18 270 lb 6.4 oz (122.7 kg)  12/14/17 269 lb (122 kg)      Other studies Reviewed: Additional studies/ records  that were reviewed today include: TTE 08/25/15  Review of the above records today demonstrates:  - Left ventricle: The cavity size was normal. Wall thickness was   increased in a pattern of severe LVH. Systolic function was   normal. The estimated ejection fraction was in the range of 50%   to 55%. Wall motion was normal; there were no regional wall   motion abnormalities. The study is not technically sufficient to   allow evaluation of LV diastolic function. - Aortic valve: Moderately calcified. Moderate stenosis. Trivial   regurgitation. Mean gradient (S): 26 mm Hg. Peak gradient (S): 50   mm Hg. Valve area (VTI): 0.93 cm^2. Valve area (Vmax): 1.01 cm^2.   Valve area (Vmean): 0.96 cm^2. - Mitral valve: Calcified annulus. Mildly thickened leaflets .   There was trivial regurgitation. - Left atrium: Severely dilated. - Systemic veins: The IVC measures >2.1 cm, but collapses >50%,   suggesting an elevated RA pressure of 8 mmHg.  Holter 02/20/17 - personally reviewed  Atrial fibrillation with slow ventricular response  Frequent pauses- longest 5.08 seconds  Occasional PVCs  ASSESSMENT AND PLAN:  1.  Atrial fibrillation, permanent: Currently on Eliquis.  Status post Saint Jude dual-chamber pacemaker 03/23/2017 due to significant bradycardia.  Device functioning appropriately.  No changes..  This patients CHA2DS2-VASc Score and unadjusted Ischemic Stroke Rate (% per year) is equal to 9.7 % stroke rate/year from a score of 6  Above score calculated as 1 point each if present [CHF, HTN, DM, Vascular=MI/PAD/Aortic Plaque, Age if 65-74, or Male] Above score calculated as 2 points each if present [Age > 75, or Stroke/TIA/TE]   2.  Coronary artery disease: CABG in 2015.  No current chest pain.  3.  Moderate to severe aortic stenosis: This post TAVR 05/09/2017.  Plan per primary cardiology.  4.  Hypertension: Controlled today  5.  CVA with hemorrhagic transformation: Continue  Eliquis  6.  Shortness of breath: At this point is unclear to me as the cause of his shortness of breath.  We did make his rate response more aggressive which may help when he is exerting himself.  He does not have shortness of  breath at rest.  He also has a history of tobacco abuse.  We Braxdon Gappa get a chest x-ray to see if there is any evidence of smoking related issues.  Current medicines are reviewed at length with the patient today.   The patient does not have concerns regarding his medicines.  The following changes were made today: none  Labs/ tests ordered today include:  Orders Placed This Encounter  Procedures  . DG Chest 2 View   Case discussed with primary cardiology  Disposition:   FU with Latria Mccarron 12 months  Signed, Zuzu Befort Meredith Leeds, MD  03/27/2018 3:51 PM     [place] [address] [place] [address] [phone] (office) [phone] (fax)

## 2018-03-27 NOTE — Patient Instructions (Signed)
Medication Instructions:  Your physician recommends that you continue on your current medications as directed. Please refer to the Current Medication list given to you today.  *If you need a refill on your cardiac medications before your next appointment, please call your pharmacy*  Labwork: None ordered  Testing/Procedures: A chest x-ray takes a picture of the organs and structures inside the chest, including the heart, lungs, and blood vessels. This test can show several things, including, whether the heart is enlarges; whether fluid is building up in the lungs; and whether pacemaker / defibrillator leads are still in place.  Follow-Up: Remote monitoring is used to monitor your Pacemaker or ICD from home. This monitoring reduces the number of office visits required to check your device to one time per year. It allows [place] to keep an eye on the functioning of your device to ensure it is working properly. You are scheduled for a device check from home on 06/26/18. You may send your transmission at any time that day. If you have a wireless device, the transmission will be sent automatically. After your physician reviews your transmission, you will receive a postcard with your next transmission date.  Your physician wants you to follow-up in: 1 year with Dr. Curt Bears.  You will receive a reminder letter in the mail two months in advance. If you don't receive a letter, please call our office to schedule the follow-up appointment.  Thank you for choosing [facility]!!   Trinidad Curet, RN [phone]

## 2018-03-28 LAB — CUP PACEART INCLINIC DEVICE CHECK
Date Time Interrogation Session: 20200304163828
Implantable Lead Implant Date: 20190228
Implantable Lead Location: 753860
Implantable Pulse Generator Implant Date: 20190228
Pulse Gen Model: 1272
Pulse Gen Serial Number: 8971140

## 2018-03-29 ENCOUNTER — Ambulatory Visit
Admission: RE | Admit: 2018-03-29 | Discharge: 2018-03-29 | Disposition: A | Discharge status: Home / Self Care | Payer: Medicare Other | Source: Ambulatory Visit | Attending: Cardiology | Admitting: Cardiology

## 2018-03-29 DIAGNOSIS — R0602 Shortness of breath: Secondary | ICD-10-CM

## 2018-03-29 DIAGNOSIS — Z87891 Personal history of nicotine dependence: Secondary | ICD-10-CM

## 2018-04-02 ENCOUNTER — Other Ambulatory Visit: Payer: Self-pay | Admitting: Cardiology

## 2018-04-05 ENCOUNTER — Other Ambulatory Visit: Payer: Self-pay

## 2018-04-05 MED ORDER — HYDRALAZINE HCL 25 MG PO TABS: 25.0000 mg | ORAL_TABLET | Freq: Two times a day (BID) | ORAL | 11 refills | Status: DC

## 2018-04-05 NOTE — Telephone Encounter (Signed)
Rx(s) sent to pharmacy electronically.  

## 2018-04-07 NOTE — Progress Notes (Deleted)
Cardiology Office Note    Date:  04/07/2018   ID:  Evan [name], DOB 1940-08-02, MRN [id number]  PCP:  Kathlyn Sacramento, MD  Cardiologist: Dr. [name] Electrophysiologist Dr. Curt Bears PV specialist: Dr. Burt Knack  No chief complaint on file.   History of Present Illness:  Evan [name] is a 78 y.o. male seen for follow up CAD and prior TAVR. He has a  past medical history of CAD s/p CABG 2005, hypertension, hyperlipidemia, carotid artery disease, permanent atrial fibrillation, severe aortic stenosis s/p TAVR, DM 2, and pacemaker.  Cardiac catheterization in 2007 demonstrated patent grafts.  Myoview in 2013 showed no significant abnormality.  He had left carotid enterectomy in May 2014.  He is also intolerant to statins due to severe myalgia and weakness.  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  Sleep study in October 2017 only showed borderline sleep apnea with minimal desaturation.    Follow-up echocardiogram obtained on 03/01/2017 showed the patient's aortic stenosis has significantly progressed since 2017.  Dr. Curt Bears recommended permanent pacemaker placement due to severe conduction disease and this was placed at the end of February. He eventually had St Jude MRI compatible dual-chamber pacemaker implanted on 03/23/2017.  He was evaluated by Dr. Burt Knack for TAVR and eventually underwent the procedure with Dr. Cyndia Bent and Dr. Burt Knack with placement of Edwards Sapien 3 THV size 26 mm bioprosthetic valve on 05/09/17.  He had no complications. Follow up Echo in May showed a good result. ASA was discontinued in November. On Eliquis.  On his last visit he was started on lasix due to dyspnea and edema. Seen by Dr. Curt Bears on March 3 and continued to complain of dyspnea. CXR showed NAD. Pacer rate responsiveness reprogrammed.   He is seen  today. He notes increased dyspnea on exertion walking to his car or up hill. Some LE edema. Weight has increased 4 lbs since the summer. No chest pain or palpitations. Notes swelling goes down at night but does have nocturia.     Past Medical History:  Diagnosis Date  . Anemia   . Carotid arterial disease ([place])   . CHF (congestive heart failure) ([place])   . Coronary artery disease   . HOH (hard of hearing)   . Hyperlipidemia   . Hypertension   . Nocturia   . Obesity   . Osteoarthritis    "BUE; BLE; right knee" (03/23/2017)  . Peripheral vision loss, bilateral    S/P 07/2015  . Permanent atrial fibrillation   . 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 Severe aortic stenosis   . Sinus drainage   . Stroke [facility]) 07/2015   "peripheral vision still bad out of left eye" (03/23/2017)  . Torn rotator cuff 2012   Right arm  . Type II diabetes mellitus ([place])   . Urinary frequency     Past Surgical History:  Procedure Laterality Date  . BACK SURGERY    . CARDIAC CATHETERIZATION  11/29/2005   SEVERE THREE VESSEL OBSTRUCTIVE ATHERSCLEROTIC CAD. ALL GRAFTS WERE PATENT. EF 65%  . CARDIAC CATHETERIZATION  03/23/2017  . CAROTID ENDARTERECTOMY Right 01-29-08   cea  . CARPAL TUNNEL RELEASE Right   . CORONARY ARTERY BYPASS GRAFT  03/2003   X3. LIMA GRAFT TO THE LAD, SAPHENOUS VEIN GRAFT TO THE PA, AND A LEFT RADIAL GRAFT TO  THEOBTUSE MARGINAL VESSEL  . ENDARTERECTOMY Left 05/28/2012   Procedure: ENDARTERECTOMY CAROTID;  Surgeon: Elam Dutch, MD;  Location: [facility];  Service: Vascular;  Laterality: Left;  . INSERT / REPLACE / REMOVE PACEMAKER  03/23/2017  . [place]; 1984  . MOLE SURGERY     "? face/arms"  . PACEMAKER IMPLANT N/A 03/23/2017   Procedure: PACEMAKER IMPLANT;  Surgeon: Constance Haw, MD;  Location: [place] CV LAB;  Service: Cardiovascular;  Laterality: N/A;  . PATCH ANGIOPLASTY Left 05/28/2012   Procedure: WITH dACRON PATCH ANGIOPLASTY;   Surgeon: Elam Dutch, MD;  Location: [facility];  Service: Vascular;  Laterality: Left;  . RIGHT/LEFT HEART CATH AND CORONARY/GRAFT ANGIOGRAPHY N/A 03/23/2017   Procedure: RIGHT/LEFT HEART CATH AND CORONARY/GRAFT ANGIOGRAPHY;  Surgeon: Sherren Mocha, MD;  Location: [place] [facility];  Service: Cardiovascular;  Laterality: N/A;  . TEE WITHOUT CARDIOVERSION N/A 05/09/2017   Procedure: TRANSESOPHAGEAL ECHOCARDIOGRAM (TEE);  Surgeon: Sherren Mocha, MD;  Location: [place];  Service: Open Heart Surgery;  Laterality: N/A;  . TRANSCATHETER AORTIC VALVE REPLACEMENT, TRANSFEMORAL N/A 05/09/2017   Procedure: TRANSCATHETER AORTIC VALVE REPLACEMENT, TRANSFEMORAL using an Edwards 31mm Sapien 3 Aortic Valve;  Surgeon: Sherren Mocha, MD;  Location: [facility];  Service: Open Heart Surgery;  Laterality: N/A;    Current Medications: Outpatient Medications Prior to Visit  Medication Sig Dispense Refill  . acetaminophen (TYLENOL) 650 MG CR tablet Take 1,300 mg by mouth every 8 (eight) hours as needed for pain.     Marland Kitchen amLODipine (NORVASC) 5 MG tablet Take 1 tablet (5 mg total) by mouth daily. 90 tablet 3  . benazepril (LOTENSIN) 40 MG tablet Take 1 tablet (40 mg total) by mouth daily. 90 tablet 0  . Camphor-Menthol-Methyl Sal (MUSCLE RUB ULTRA STRENGTH) 05-03-28 % CREA Apply 1 application topically 3 (three) times daily as needed (muscle pain).    . Cyanocobalamin (RA VITAMIN B-12 TR) 1000 MCG TBCR Take 1,000 mcg by mouth daily.     Marland Kitchen ELIQUIS 5 MG TABS tablet TAKE 1 TABLET BY MOUTH EVERY 12 HOURS 60 tablet 5  . ezetimibe (ZETIA) 10 MG tablet Take 1 tablet (10 mg total) by mouth daily. 90 tablet 3  . fluticasone (FLONASE) 50 MCG/ACT nasal spray Place 2 sprays into both nostrils daily.    . furosemide (LASIX) 20 MG tablet Take 1 tablet (20 mg total) by mouth daily. 90 tablet 3  . glimepiride (AMARYL) 2 MG tablet TAKE 1 TABLET (2 MG TOTAL) BY MOUTH DAILY WITH BREAKFAST 90 tablet 0  . guaiFENesin-codeine 100-10 MG/5ML syrup  Take 5 mLs by mouth every 6 (six) hours as needed for cough.     . hydrALAZINE (APRESOLINE) 25 MG tablet Take 1 tablet (25 mg total) by mouth 2 (two) times daily. 60 tablet 11  . hydrochlorothiazide (HYDRODIURIL) 25 MG tablet TAKE 1 TABLET BY MOUTH EVERY DAY IN THE MORNING 90 tablet 1  . OVER THE COUNTER MEDICATION Replenex tablets: Take 3 tablet by mouth every morning (joint health)    . Polyvinyl Alcohol-Povidone (REFRESH OP) Place 2 drops into both eyes daily as needed (for dry eyes).     . tamsulosin (FLOMAX) 0.4 MG CAPS capsule TAKE 1 CAPSULE BY MOUTH ONCE DAILY (Patient taking differently: TAKE 0.4 MG BY MOUTH ONCE DAILY IN THE EVENING) 90 capsule 3   No facility-administered medications prior to visit.      Allergies:   Niacin and related; Metformin and related; and Statins  Social History   Socioeconomic History  . Marital status: Married    Spouse name: Not on file  . Number of children: 5  . Years of education: Not on file  . Highest education level: Not on file  Occupational History  . Occupation: retired    Fish farm manager: RETIRED  Social Needs  . Financial resource strain: Not on file  . Food insecurity:    Worry: Not on file    Inability: Not on file  . Transportation needs:    Medical: Not on file    Non-medical: Not on file  Tobacco Use  . Smoking status: Former Smoker    Packs/day: 2.00    Years: 39.00    Pack years: 78.00    Types: Cigarettes    Last attempt to quit: 01/24/1994    Years since quitting: 24.2  . Smokeless tobacco: Never Used  Substance and Sexual Activity  . Alcohol use: No  . Drug use: No  . Sexual activity: Never  Lifestyle  . Physical activity:    Days per week: Not on file    Minutes per session: Not on file  . Stress: Not on file  Relationships  . Social connections:    Talks on phone: Not on file    Gets together: Not on file    Attends religious service: Not on file    Active member of club or organization: Not on file    Attends  meetings of clubs or organizations: Not on file    Relationship status: Not on file  Other Topics Concern  . Not on file  Social History Narrative  . Not on file     Family History:  The patient's family history includes Arrhythmia in his brother; Cancer in his brother; Heart attack in his brother and father; Parkinsonism in his brother.   ROS:   Please see the history of present illness.    ROS All other systems reviewed and are negative.   PHYSICAL EXAM:   VS:  There were no vitals taken for this visit.   GEN: Well nourished, well developed, in no acute distress  HEENT: normal  Neck: no JVD, carotid bruits, or masses Cardiac: RRR; gr 1/6 SEM, rubs, or gallops, 1+ edema  +pacemaker present Respiratory:  clear to auscultation bilaterally, normal work of breathing GI: soft, nontender, nondistended, + BS MS: no deformity or atrophy  Skin: warm and dry, no rash Neuro:  Alert and Oriented x 3, Strength and sensation are intact Psych: euthymic mood, full affect  Wt Readings from Last 3 Encounters:  03/27/18 269 lb (122 kg)  01/09/18 270 lb 6.4 oz (122.7 kg)  12/14/17 269 lb (122 kg)      Studies/Labs Reviewed:   EKG:  EKG is not ordered today.   Recent Labs: 05/05/2017: B Natriuretic Peptide 280.0 05/10/2017: Magnesium 2.0 01/29/2018: ALT 15 03/02/2018: BUN 22; Creatinine, Ser 1.46; Hemoglobin 10.0; Platelets 253; Potassium 4.8; Sodium 137   Lipid Panel    Component Value Date/Time   CHOL 104 01/29/2018 0000   TRIG 75 01/29/2018 0000   HDL 45 01/29/2018 0000   CHOLHDL 2.3 01/29/2018 0000   CHOLHDL 2.3 12/11/2015 0854   VLDL 12 12/11/2015 0854   LDLCALC 44 01/29/2018 0000   Labs dated 07/31/17; Hgb 10.6. Glucose 137. Otherwise CMET normal. Cholesterol 110, triglycerides 74, HDL 49, LDL 46.   Additional studies/ records that were reviewed today include:   Cath 03/23/2017 Conclusion     Mid RCA lesion  is 100% stenosed.  Ost LM to Dist LM lesion is 70% stenosed.   Prox Cx lesion is 90% stenosed.  Ost 2nd Mrg lesion is 100% stenosed.  Prox LAD-1 lesion is 90% stenosed.  Prox LAD-2 lesion is 100% stenosed.  LIMA and is normal in caliber.  The graft exhibits no disease.  SVG graft was visualized by angiography and is normal in caliber.  Left radial artery graft was visualized by angiography.   1.  Severe native three-vessel coronary artery disease with total occlusion of the RCA, severe left main stenosis, total occlusion of the LAD with severe calcification, and severe stenosis of the circumflex and its branch vessels.  2.  Status post aortocoronary bypass surgery with continued patency of the LIMA to LAD, continued patency of the free radial graft to OM with tight stenosis at the distal anastomosis involving a small caliber vessel, and continued patency of the saphenous vein graft to PDA without any significant disease.  3.  Severe aortic valve stenosis with a mean gradient of 40 mmHg.  The calculated valve area is 1.74 cm likely related to high cardiac output  4.  Moderate pulmonary hypertension with a PA pressure of 72/25 with a mean of 38   Recommendations: The patient will undergo permanent pacemaker placement for symptomatic atrial fibrillation with slow ventricular rate.  He will continue to undergo evaluation by the multidisciplinary heart team for consideration of treatment options for his severe aortic stenosis.      Limited echo 05/10/2017 LV EF: 60% -   65% Study Conclusions  - Left ventricle: The cavity size was normal. Systolic function was   normal. The estimated ejection fraction was in the range of 60%   to 65%. Wall motion was normal; there were no regional wall   motion abnormalities. - Ventricular septum: The contour showed diastolic flattening. - Aortic valve: A TAVR bioprosthesis was present. There was no   regurgitation. Peak velocity (S): 207 cm/s. Mean gradient (S): 7   mm Hg. Valve area (VTI): 1.65 cm^2.  Valve area (Vmax): 1.44 cm^2.   Valve area (Vmean): 1.77 cm^2. - Mitral valve: Moderately calcified annulus. - Pulmonary arteries: Systolic pressure was moderately to severely   increased. PA peak pressure: 65 mm Hg (S).  Echo 06/14/17:Study Conclusions  - Left ventricle: The cavity size was normal. Wall thickness was   increased in a pattern of moderate LVH. Indeterminant diastolic   function. The estimated ejection fraction was 55%. Although no   diagnostic regional wall motion abnormality was identified, this   possibility cannot be completely excluded on the basis of this   study. - Aortic valve: Bioprosthetic aortic valve s/p TAVR. There was no   stenosis. There was no regurgitation. Mean gradient (S): 12 mm   Hg. - Mitral valve: Moderately calcified annulus. - Left atrium: The atrium was severely dilated. - Right ventricle: The cavity size was mildly dilated. Pacer wire   or catheter noted in right ventricle. Systolic function was   normal. - Right atrium: The atrium was mildly dilated. - Tricuspid valve: Peak RV-RA gradient (S): 36 mm Hg. - Pulmonary arteries: PA peak pressure: 39 mm Hg (S). - Inferior vena cava: The vessel was normal in size. The   respirophasic diameter changes were in the normal range (>= 50%),   consistent with normal central venous pressure.  Impressions:  - Normal LV size with moderate LV hypertrophy. EF 55%. Mildly   dilated RV with normal systolic function. Bioprosthetic aortic  valve s/p TAVR, no significant stenosis or regurgitation. Mild   pulmonary hypertension.      ASSESSMENT:    No diagnosis found.   PLAN:  In order of problems listed above:  1. CAD s/p CABG: Patient underwent a cardiac catheterization in February 2019, grafts still patent. Continued medical therapy was recommended.    2. Severe aortic stenosis s/p TAVR in April 2019: Echo in May showed good valve function. On Eliquis. ASA discontinued.   3. History of  permanent atrial fibrillation with slow ventricular rate: S/p pacemaker.  On Eliquis 5 mg twice daily.  4.   Volume overload due to diastolic dysfunction. Will add lasix 20 mg daily. Restrict sodium intake. Check labs in 2-3 weeks. Follow up in 3 months.  4. Hypertension: BP control has improved with addition of hydralazine. Will also add lasix today as noted above.   5. Hyperlipidemia: on Crestor with excellent lipids. With leg pain will reduce dose to 10 mg daily. Repeat lipids with upcoming lab work  6. Carotid artery disease: History of left carotid endarterectomy in 2014.  Stable on last ultrasound 01/10/2017.  7.   S/p right PCA CVA with visual cut.     Medication Adjustments/Labs and Tests Ordered: Current medicines are reviewed at length with the patient today.  Concerns regarding medicines are outlined above.  Medication changes, Labs and Tests ordered today are listed in the Patient Instructions below. There are no Patient Instructions on file for this visit.   Signed, Sansa Alkema [name], MD  04/07/2018 4:43 PM    [facility] [place], [place], [place]  [address] Phone: [phone]; Fax: [phone]

## 2018-04-09 ENCOUNTER — Telehealth: Payer: Self-pay | Admitting: Cardiology

## 2018-04-09 MED ORDER — HYDRALAZINE HCL 25 MG PO TABS: 25.0000 mg | ORAL_TABLET | Freq: Two times a day (BID) | ORAL | 11 refills | Status: DC

## 2018-04-09 NOTE — Telephone Encounter (Signed)
Returned call to patient's wife calling to reschedule husband's appointment this week.Appointment rescheduled to 05/28/18 at 1:40 pm.

## 2018-04-09 NOTE — Telephone Encounter (Signed)
New Message   Patient would like to speak to [facility] regarding her appointment.

## 2018-04-12 ENCOUNTER — Ambulatory Visit: Payer: Medicare Other | Admitting: Cardiology

## 2018-05-09 ENCOUNTER — Other Ambulatory Visit (HOSPITAL_COMMUNITY): Payer: Medicare Other

## 2018-05-09 ENCOUNTER — Ambulatory Visit: Payer: Medicare Other | Admitting: Physician Assistant

## 2018-05-10 ENCOUNTER — Telehealth: Payer: Self-pay

## 2018-05-10 NOTE — Telephone Encounter (Signed)
Called and discussed OV and echo scheduled next week with the patient and his wife. They understand due to COVID 19 restrictions, the 1 year TAVR echo has been rescheduled to 8/3. They will keep their scheduled appointment with Nell Range next week and consent to an e-visit.   Consent information seen below.  The patient will use Doximity for eVisit. Phone number confirmed and Doximity practiced and demonstrated by patient.      Virtual Visit Pre-Appointment Phone Call  Steps For Call:  1. Confirm consent - "In the setting of the current Covid19 crisis, you are scheduled for a (phone or video) visit with your provider on (date) at (time).  Just as we do with many in-office visits, in order for you to participate in this visit, we must obtain consent.  If you'd like, I can send this to your mychart (if signed up) or email for you to review.  Otherwise, I can obtain your verbal consent now.  All virtual visits are billed to your insurance company just like a normal visit would be.  By agreeing to a virtual visit, we'd like you to understand that the technology does not allow for your provider to perform an examination, and thus may limit your provider's ability to fully assess your condition.  Finally, though the technology is pretty good, we cannot assure that it will always work on either your or our end, and in the setting of a video visit, we may have to convert it to a phone-only visit.  In either situation, we cannot ensure that we have a secure connection.  Are you willing to proceed?" STAFF: Did the patient verbally acknowledge consent to telehealth visit? Document YES/NO here: YES  2. Confirm the BEST phone number to call the day of the visit by including in appointment notes  3. Give patient instructions for WebEx/MyChart download to smartphone as below or Doximity/[email or web address].me if video visit (depending on what platform provider is using)  4. Advise patient to be prepared with their blood  pressure, heart rate, weight, any heart rhythm information, their current medicines, and a piece of paper and pen handy for any instructions they may receive the day of their visit  5. Inform patient they will receive a phone call 15 minutes prior to their appointment time (may be from unknown caller ID) so they should be prepared to answer  6. Confirm that appointment type is correct in Epic appointment notes (VIDEO vs PHONE)     TELEPHONE CALL NOTE  Evan [name] has been deemed a candidate for a follow-up tele-health visit to limit community exposure during the Covid-19 pandemic. I spoke with the patient via phone to ensure availability of phone/video source, confirm preferred email & phone number, and discuss instructions and expectations.  I reminded Evan [name] to be prepared with any vital sign and/or heart rhythm information that could potentially be obtained via home monitoring, at the time of his visit. I reminded Evan [name] to expect a phone call/text message at the time of his visit.  Evan Parma, RN 05/10/2018 3:52 PM    IF USING DOXIMITY or DOXY.ME - The patient will receive a link just prior to their visit, either by text or email (to be determined day of appointment depending on if it's doxy.me or Doximity).     FULL LENGTH CONSENT FOR TELE-HEALTH VISIT   I hereby voluntarily request, consent and authorize [facility] and its employed or contracted physicians, Engineer, materials, nurse practitioners  or other licensed health care professionals (the Practitioner), to provide me with telemedicine health care services (the "Services") as deemed necessary by the treating Practitioner. I acknowledge and consent to receive the Services by the Practitioner via telemedicine. I understand that the telemedicine visit will involve communicating with the Practitioner through live audiovisual communication technology and the disclosure of certain medical information by  electronic transmission. I acknowledge that I have been given the opportunity to request an in-person assessment or other available alternative prior to the telemedicine visit and am voluntarily participating in the telemedicine visit.  I understand that I have the right to withhold or withdraw my consent to the use of telemedicine in the course of my care at any time, without affecting my right to future care or treatment, and that the Practitioner or I may terminate the telemedicine visit at any time. I understand that I have the right to inspect all information obtained and/or recorded in the course of the telemedicine visit and may receive copies of available information for a reasonable fee.  I understand that some of the potential risks of receiving the Services via telemedicine include:  Marland Kitchen Delay or interruption in medical evaluation due to technological equipment failure or disruption; . Information transmitted may not be sufficient (e.g. poor resolution of images) to allow for appropriate medical decision making by the Practitioner; and/or  . In rare instances, security protocols could fail, causing a breach of personal health information.  Furthermore, I acknowledge that it is my responsibility to provide information about my medical history, conditions and care that is complete and accurate to the best of my ability. I acknowledge that Practitioner's advice, recommendations, and/or decision may be based on factors not within their control, such as incomplete or inaccurate data provided by me or distortions of diagnostic images or specimens that may result from electronic transmissions. I understand that the practice of medicine is not an exact science and that Practitioner makes no warranties or guarantees regarding treatment outcomes. I acknowledge that I will receive a copy of this consent concurrently upon execution via email to the email address I last provided but may also request a printed  copy by calling the office of [place].    I understand that my insurance will be billed for this visit.   I have read or had this consent read to me. . I understand the contents of this consent, which adequately explains the benefits and risks of the Services being provided via telemedicine.  . I have been provided ample opportunity to ask questions regarding this consent and the Services and have had my questions answered to my satisfaction. . I give my informed consent for the services to be provided through the use of telemedicine in my medical care  By participating in this telemedicine visit I agree to the above.

## 2018-05-16 ENCOUNTER — Telehealth (INDEPENDENT_AMBULATORY_CARE_PROVIDER_SITE_OTHER): Payer: Medicare Other | Admitting: Physician Assistant

## 2018-05-16 ENCOUNTER — Other Ambulatory Visit: Payer: Self-pay

## 2018-05-16 ENCOUNTER — Other Ambulatory Visit (HOSPITAL_COMMUNITY): Payer: Medicare Other

## 2018-05-16 ENCOUNTER — Telehealth: Payer: Self-pay

## 2018-05-16 VITALS — BP 134/65 | HR 68 | Wt 263.0 lb

## 2018-05-16 DIAGNOSIS — Z953 Presence of xenogenic heart valve: Secondary | ICD-10-CM | POA: Diagnosis not present

## 2018-05-16 DIAGNOSIS — R001 Bradycardia, unspecified: Secondary | ICD-10-CM

## 2018-05-16 DIAGNOSIS — I10 Essential (primary) hypertension: Secondary | ICD-10-CM | POA: Diagnosis not present

## 2018-05-16 DIAGNOSIS — I2510 Atherosclerotic heart disease of native coronary artery without angina pectoris: Secondary | ICD-10-CM | POA: Diagnosis not present

## 2018-05-16 DIAGNOSIS — I4821 Permanent atrial fibrillation: Secondary | ICD-10-CM

## 2018-05-16 DIAGNOSIS — I350 Nonrheumatic aortic (valve) stenosis: Secondary | ICD-10-CM

## 2018-05-16 DIAGNOSIS — I739 Peripheral vascular disease, unspecified: Secondary | ICD-10-CM

## 2018-05-16 DIAGNOSIS — I25810 Atherosclerosis of coronary artery bypass graft(s) without angina pectoris: Secondary | ICD-10-CM

## 2018-05-16 DIAGNOSIS — Z7189 Other specified counseling: Secondary | ICD-10-CM

## 2018-05-16 DIAGNOSIS — Z950 Presence of cardiac pacemaker: Secondary | ICD-10-CM

## 2018-05-16 NOTE — Telephone Encounter (Signed)
Virtual Visit Pre-Appointment Phone Call  "(Name), I am calling you today to discuss your upcoming appointment. We are currently trying to limit exposure to the virus that causes COVID-19 by seeing patients at home rather than in the office."  1. "What is the BEST phone number to call the day of the visit?" - [phone]  2. "Do you have or have access to (through a family member/friend) a smartphone with video capability that we can use for your visit?" a. If yes - list this number in appt notes as "cell" (if different from BEST phone #) and list the appointment type as a VIDEO visit in appointment notes  3. Confirm consent - "In the setting of the current Covid19 crisis, you are scheduled for a (video) visit with your provider on (May 4) at (2:40pm).  Just as we do with many in-office visits, in order for you to participate in this visit, we must obtain consent.  If you'd like, I can send this to your mychart (if signed up) or email for you to review.  Otherwise, I can obtain your verbal consent now.  All virtual visits are billed to your insurance company just like a normal visit would be.  By agreeing to a virtual visit, we'd like you to understand that the technology does not allow for your provider to perform an examination, and thus may limit your provider's ability to fully assess your condition. If your provider identifies any concerns that need to be evaluated in person, we will make arrangements to do so.  Finally, though the technology is pretty good, we cannot assure that it will always work on either your or our end, and in the setting of a video visit, we may have to convert it to a phone-only visit.  In either situation, we cannot ensure that we have a secure connection.  Are you willing to proceed?" STAFF: Did the patient verbally acknowledge consent to telehealth visit? Document YES/NO here:Yes  4. Advise patient to be prepared - "Two hours prior to your appointment, go ahead and  check your blood pressure, pulse, oxygen saturation, and your weight (if you have the equipment to check those) and write them all down. When your visit starts, your provider will ask you for this information. If you have an Apple Watch or Kardia device, please plan to have heart rate information ready on the day of your appointment. Please have a pen and paper handy nearby the day of the visit as well."  5. Give patient instructions for MyChart download to smartphone OR Doximity/Doxy.me as below if video visit (depending on what platform provider is using)  6. Inform patient they will receive a phone call 15 minutes prior to their appointment time (may be from unknown caller ID) so they should be prepared to answer    TELEPHONE CALL NOTE  ETHANAEL VEITH has been deemed a candidate for a follow-up tele-health visit to limit community exposure during the Covid-19 pandemic. I spoke with the patient via phone to ensure availability of phone/video source, confirm preferred email & phone number, and discuss instructions and expectations.  I reminded Evan [name] to be prepared with any vital sign and/or heart rhythm information that could potentially be obtained via home monitoring, at the time of his visit. I reminded CAIGE ALMEDA to expect a phone call prior to his visit.  Rhetta Mura, [place] 05/16/2018 10:46 AM   INSTRUCTIONS FOR DOWNLOADING THE MYCHART APP TO SMARTPHONE  -  The patient must first make sure to have activated MyChart and know their login information - If Apple, go to [employer] and type in MyChart in the search bar and download the app. If Android, ask patient to go to [employer] and type in [place] in the search bar and download the app. The app is free but as with any other app downloads, their phone may require them to verify saved payment information or Apple/Android password.  - The patient will need to then log into the app with their MyChart username and password,  and select [facility] as their healthcare provider to link the account. When it is time for your visit, go to the MyChart app, find appointments, and click Begin Video Visit. Be sure to Select Allow for your device to access the Microphone and Camera for your visit. You will then be connected, and your provider will be with you shortly.  **If they have any issues connecting, or need assistance please contact MyChart service desk [phone])**  **If using a computer, in order to ensure the best quality for their visit they will need to use either of the following Internet Browsers: [employer], or Google Chrome**  IF USING DOXIMITY or DOXY.ME - The patient will receive a link just prior to their visit by text.     FULL LENGTH CONSENT FOR TELE-HEALTH VISIT   I hereby voluntarily request, consent and authorize [facility] and its employed or contracted physicians, physician assistants, nurse practitioners or other licensed health care professionals (the Practitioner), to provide me with telemedicine health care services (the "Services") as deemed necessary by the treating Practitioner. I acknowledge and consent to receive the Services by the Practitioner via telemedicine. I understand that the telemedicine visit will involve communicating with the Practitioner through live audiovisual communication technology and the disclosure of certain medical information by electronic transmission. I acknowledge that I have been given the opportunity to request an in-person assessment or other available alternative prior to the telemedicine visit and am voluntarily participating in the telemedicine visit.  I understand that I have the right to withhold or withdraw my consent to the use of telemedicine in the course of my care at any time, without affecting my right to future care or treatment, and that the Practitioner or I may terminate the telemedicine visit at any time. I understand that I  have the right to inspect all information obtained and/or recorded in the course of the telemedicine visit and may receive copies of available information for a reasonable fee.  I understand that some of the potential risks of receiving the Services via telemedicine include:  Marland Kitchen Delay or interruption in medical evaluation due to technological equipment failure or disruption; . Information transmitted may not be sufficient (e.g. poor resolution of images) to allow for appropriate medical decision making by the Practitioner; and/or  . In rare instances, security protocols could fail, causing a breach of personal health information.  Furthermore, I acknowledge that it is my responsibility to provide information about my medical history, conditions and care that is complete and accurate to the best of my ability. I acknowledge that Practitioner's advice, recommendations, and/or decision may be based on factors not within their control, such as incomplete or inaccurate data provided by me or distortions of diagnostic images or specimens that may result from electronic transmissions. I understand that the practice of medicine is not an exact science and that Practitioner makes no warranties or guarantees regarding treatment  outcomes. I acknowledge that I will receive a copy of this consent concurrently upon execution via email to the email address I last provided but may also request a printed copy by calling the office of [facility].    I understand that my insurance will be billed for this visit.   I have read or had this consent read to me. . I understand the contents of this consent, which adequately explains the benefits and risks of the Services being provided via telemedicine.  . I have been provided ample opportunity to ask questions regarding this consent and the Services and have had my questions answered to my satisfaction. . I give my informed consent for the services to be provided through the  use of telemedicine in my medical care  By participating in this telemedicine visit I agree to the above.

## 2018-05-16 NOTE — Progress Notes (Signed)
[facility]   MULTIDISCIPLINARY HEART VALVE TEAM     Virtual Visit via Video Note   This visit type was conducted due to national recommendations for restrictions regarding the COVID-19 Pandemic (e.g. social distancing) in an effort to limit this patient's exposure and mitigate transmission in our community.  Due to his co-morbid illnesses, this patient is at least at moderate risk for complications without adequate follow up.  This format is felt to be most appropriate for this patient at this time.  All issues noted in this document were discussed and addressed.  A limited physical exam was performed with this format.  Please refer to the patient's chart for his consent to telehealth for [facility].   Evaluation Performed:  Follow-up visit  Date:  05/16/2018   ID:  Evan [name], DOB 06-Sep-1940, MRN [id number]  Patient Location: Home Provider Location: Office  PCP:  Kathlyn Sacramento, MD  Cardiologist:  [name] [name], MD / Dr. Burt Knack & Dr. Cyndia Bent (TAVR) Electrophysiologist:  Will Meredith Leeds, MD   Chief Complaint:  1 year s/p TAVR   History of Present Illness:    Evan [name] is a 78 y.o. 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   The patient does not have symptoms concerning for COVID-19 infection (fever, chills, cough, or new shortness of breath).   He underwent successful TAVR with a 26 mm Edwards Sapien 3 THV on 05/09/17. Post operative echo showed a normally functioning TAVR valve with mean gradient of 7 mm Hg and no PVL. He was discharged on ASA and Eliquis. 1 month echo showed EF 55% with a normally functioning TAVR valve, mean gradient 12 mmHg and no PVL.    Today he presents for follow up. He still has some shortness of breath, but not  very active at baseline. He still has some LE edema that has improved a lot since his TAVR. He finished his cardiac rehab last year and hasn't kept up with exercise since. No chest pain. No dizziness or syncope. No blood In stool or urine.   Past Medical History:  Diagnosis Date  . Anemia   . Carotid arterial disease ([place])   . CHF (congestive heart failure) ([place])   . Coronary artery disease   . HOH (hard of hearing)   . Hyperlipidemia   . Hypertension   . Nocturia   . Obesity   . Osteoarthritis    "BUE; BLE; right knee" (03/23/2017)  . Peripheral vision loss, bilateral    S/P 07/2015  . Permanent atrial fibrillation   . 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 Severe aortic stenosis   . Sinus drainage   . Stroke [facility]) 07/2015   "peripheral vision still bad out of left eye" (03/23/2017)  . Torn rotator cuff 2012   Right arm  . Type II diabetes mellitus ([place])   . Urinary frequency    Past Surgical History:  Procedure Laterality Date  . BACK SURGERY    . CARDIAC CATHETERIZATION  11/29/2005   SEVERE THREE VESSEL OBSTRUCTIVE ATHERSCLEROTIC CAD. ALL GRAFTS WERE PATENT. EF 65%  . CARDIAC CATHETERIZATION  03/23/2017  . CAROTID ENDARTERECTOMY Right 01-29-08   cea  . CARPAL TUNNEL RELEASE Right   . CORONARY ARTERY BYPASS GRAFT  03/2003   X3. LIMA  GRAFT TO THE LAD, SAPHENOUS VEIN GRAFT TO THE PA, AND A LEFT RADIAL GRAFT TO THEOBTUSE MARGINAL VESSEL  . ENDARTERECTOMY Left 05/28/2012   Procedure: ENDARTERECTOMY CAROTID;  Surgeon: Elam Dutch, MD;  Location: [facility];  Service: Vascular;  Laterality: Left;  . INSERT / REPLACE / REMOVE PACEMAKER  03/23/2017  . [place]; 1984  . MOLE SURGERY     "? face/arms"  . PACEMAKER IMPLANT N/A 03/23/2017   Procedure: PACEMAKER IMPLANT;  Surgeon: Constance Haw, MD;  Location: [facility] CV LAB;  Service: Cardiovascular;  Laterality: N/A;  . PATCH ANGIOPLASTY Left 05/28/2012   Procedure: WITH dACRON PATCH  ANGIOPLASTY;  Surgeon: Elam Dutch, MD;  Location: [facility];  Service: Vascular;  Laterality: Left;  . RIGHT/LEFT HEART CATH AND CORONARY/GRAFT ANGIOGRAPHY N/A 03/23/2017   Procedure: RIGHT/LEFT HEART CATH AND CORONARY/GRAFT ANGIOGRAPHY;  Surgeon: Sherren Mocha, MD;  Location: [facility] CV LAB;  Service: Cardiovascular;  Laterality: N/A;  . TEE WITHOUT CARDIOVERSION N/A 05/09/2017   Procedure: TRANSESOPHAGEAL ECHOCARDIOGRAM (TEE);  Surgeon: Sherren Mocha, MD;  Location: [facility];  Service: Open Heart Surgery;  Laterality: N/A;  . TRANSCATHETER AORTIC VALVE REPLACEMENT, TRANSFEMORAL N/A 05/09/2017   Procedure: TRANSCATHETER AORTIC VALVE REPLACEMENT, TRANSFEMORAL using an Edwards 53mm Sapien 3 Aortic Valve;  Surgeon: Sherren Mocha, MD;  Location: [facility];  Service: Open Heart Surgery;  Laterality: N/A;     Current Meds  Medication Sig  . acetaminophen (TYLENOL) 650 MG CR tablet Take 1,300 mg by mouth every 8 (eight) hours as needed for pain.   Marland Kitchen amLODipine (NORVASC) 5 MG tablet Take 1 tablet (5 mg total) by mouth daily.  . benazepril (LOTENSIN) 40 MG tablet Take 1 tablet (40 mg total) by mouth daily.  . Camphor-Menthol-Methyl Sal (MUSCLE RUB ULTRA STRENGTH) 05-03-28 % CREA Apply 1 application topically 3 (three) times daily as needed (muscle pain).  . Cyanocobalamin (RA VITAMIN B-12 TR) 1000 MCG TBCR Take 1,000 mcg by mouth daily.   Marland Kitchen ELIQUIS 5 MG TABS tablet TAKE 1 TABLET BY MOUTH EVERY 12 HOURS  . ezetimibe (ZETIA) 10 MG tablet Take 1 tablet (10 mg total) by mouth daily.  . fluticasone (FLONASE) 50 MCG/ACT nasal spray Place 2 sprays into both nostrils daily.  . furosemide (LASIX) 20 MG tablet Take 1 tablet (20 mg total) by mouth daily.  Marland Kitchen glimepiride (AMARYL) 2 MG tablet TAKE 1 TABLET (2 MG TOTAL) BY MOUTH DAILY WITH BREAKFAST  . guaiFENesin-codeine 100-10 MG/5ML syrup Take 5 mLs by mouth every 6 (six) hours as needed for cough.   . hydrALAZINE (APRESOLINE) 25 MG tablet Take 1 tablet (25 mg  total) by mouth 2 (two) times daily.  . hydrochlorothiazide (HYDRODIURIL) 25 MG tablet TAKE 1 TABLET BY MOUTH EVERY DAY IN THE MORNING  . OVER THE COUNTER MEDICATION Replenex tablets: Take 3 tablet by mouth every morning (joint health)  . Polyvinyl Alcohol-Povidone (REFRESH OP) Place 2 drops into both eyes daily as needed (for dry eyes).   . tamsulosin (FLOMAX) 0.4 MG CAPS capsule TAKE 1 CAPSULE BY MOUTH ONCE DAILY (Patient taking differently: TAKE 0.4 MG BY MOUTH ONCE DAILY IN THE EVENING)     Allergies:   Niacin and related; Metformin and related; and Statins   Social History   Tobacco Use  . Smoking status: Former Smoker    Packs/day: 2.00    Years: 39.00    Pack years: 78.00    Types: Cigarettes    Last attempt to  quit: 01/24/1994    Years since quitting: 24.3  . Smokeless tobacco: Never Used  Substance Use Topics  . Alcohol use: No  . Drug use: No     Family Hx: The patient's family history includes Arrhythmia in his brother; Cancer in his brother; Heart attack in his brother and father; Parkinsonism in his brother.  ROS:   Please see the history of present illness.    All other systems reviewed and are negative.   Prior CV studies:   The following studies were reviewed today:  TAVR OPERATIVE NOTE   Date of Procedure:05/09/2017  Preoperative Diagnosis:Severe Aortic Stenosis   Postoperative Diagnosis:Same   Procedure:   Transcatheter Aortic Valve Replacement - PercutaneousRightTransfemoral Approach Edwards Sapien 3 THV (size 39mm, model # 9600TFX, serial # [id number])  Co-Surgeons:Bryan Alveria Apley, MD and Sherren Mocha, MD  Pre-operative Echo Findings: ? Severe aortic stenosis ? Mildleft ventricular systolic dysfunction  Post-operative Echo Findings: ? Traceparavalvular leak ? Mildleft ventricular systolic dysfunction   ________________   2D ECHO 06/14/17 (30 days post op)  Study Conclusions - Left ventricle: The cavity size was normal. Wall thickness was increased in a pattern of moderate LVH. Indeterminant diastolic function. The estimated ejection fraction was 55%. Although no diagnostic regional wall motion abnormality was identified, this possibility cannot be completely excluded on the basis of this study. - Aortic valve: Bioprosthetic aortic valve s/p TAVR. There was no stenosis. There was no regurgitation. Mean gradient (S): 12 mm Hg. - Mitral valve: Moderately calcified annulus. - Left atrium: The atrium was severely dilated. - Right ventricle: The cavity size was mildly dilated. Pacer wire or catheter noted in right ventricle. Systolic function was normal. - Right atrium: The atrium was mildly dilated. - Tricuspid valve: Peak RV-RA gradient (S): 36 mm Hg. - Pulmonary arteries: PA peak pressure: 39 mm Hg (S). - Inferior vena cava: The vessel was normal in size. The respirophasic diameter changes were in the normal range (>= 50%), consistent with normal central venous pressure Impressions: - Normal LV size with moderate LV hypertrophy. EF 55%. Mildly dilated RV with normal systolic function. Bioprosthetic aortic valve s/p TAVR, no significant stenosis or regurgitation. Mild pulmonary hypertension.  Labs/Other Tests and Data Reviewed:    EKG:  No ECG reviewed.  Recent Labs: 01/29/2018: ALT 15 03/02/2018: BUN 22; Creatinine, Ser 1.46; Hemoglobin 10.0; Platelets 253; Potassium 4.8; Sodium 137   Recent Lipid Panel Lab Results  Component Value Date/Time   CHOL 104 01/29/2018 12:00 AM   TRIG 75 01/29/2018 12:00 AM   HDL 45 01/29/2018 12:00 AM   CHOLHDL 2.3 01/29/2018 12:00 AM   CHOLHDL 2.3 12/11/2015 08:54 AM   LDLCALC 44 01/29/2018 12:00 AM    Wt Readings from Last 3 Encounters:  05/16/18 263 lb (119.3 kg)  03/27/18 269 lb (122 kg)  01/09/18 270 lb 6.4 oz (122.7 kg)     Objective:    Vital Signs:  BP  134/65   Pulse 68   Wt 263 lb (119.3 kg)   SpO2 94%   BMI 35.67 kg/m    Well nourished, well developed male in no acute distress. Obese. Trace LE edema.    ASSESSMENT & PLAN:    Severe AS s/p TAVR: doing well. Still have NYHA class II symptoms; likely related to obesity and deconditioning. SBE prophylaxis discussed; the patient is edentulous and does not go to the dentist. 1 year echo has been pushed out until August given Covid 19 pandemic.   HTN: BP well  controlled today. Continue current regimen   CAD/PAD: continue medical therapy. He recently was taken off Crestor due to myalgias.   Permanent atrial fibrillation: rate controlled. Continue Eliquis   Bradycardia s/p PPM: followed by Dr. Curt Bears.   COVID-19 Education: the signs and symptoms of COVID-19 were discussed with the patient and how to seek care for testing (follow up with PCP or arrange E-visit).  The importance of social distancing was discussed today.  Time:   Today, I have spent 25 minutes with the patient with telehealth technology discussing the above problems.     Medication Adjustments/Labs and Tests Ordered: Current medicines are reviewed at length with the patient today.  Concerns regarding medicines are outlined above.   Tests Ordered: No orders of the defined types were placed in this encounter.   Medication Changes: No orders of the defined types were placed in this encounter.   Disposition:  Follow up prn with structural heart. Echo in august   Signed,  , Hershal Coria  05/16/2018 2:18 PM    [place]

## 2018-05-16 NOTE — Patient Instructions (Signed)
Hello Mr. Duchesne,   It was so nice to talk to you on the audio/video chat today. I am so glad you are doing so well. I just wanted to send you a recap of our discussion.   Please continue on all your same medications.    Your 1 year echo has been rescheduled to August. Please continue regular follow up with you primary cardiologist.   All appointment details are listed in upcoming appointments in MyChart or attached to this letter in your "after visit summary."  Please call us with any questions or concerns you may have and please stay safe during these uncertain times.  Nell Range

## 2018-05-23 NOTE — Progress Notes (Deleted)
{Choose 1 Note Type (Telehealth Visit or Telephone Visit):[phone]}   Evaluation Performed:  Follow-up visit  Date:  05/23/2018   ID:  Evan [name], DOB [birth year], MRN [id number]  Patient Location: Home Provider Location: Home  PCP:  Kathlyn Sacramento, MD  Cardiologist:  Sonam Wandel [name], MD  Electrophysiologist:  Constance Haw, MD   Chief Complaint:  ***  History of Present Illness:    Evan [name] is a 78 y.o. male is seen for follow up CAD and prior TAVR. He has a  past medical history of CAD s/p CABG 2005, hypertension, hyperlipidemia, carotid artery disease, permanent atrial fibrillation, severe aortic stenosis s/p TAVR, DM 2, and pacemaker.  Cardiac catheterization in 2007 demonstrated patent grafts.  Myoview in 2013 showed no significant abnormality.  He had left carotid enterectomy in May 2014.  He is also intolerant to statins due to severe myalgia and weakness.  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  Sleep study in October 2017 only showed borderline sleep apnea with minimal desaturation.    Follow-up echocardiogram obtained on 03/01/2017 showed the patient's aortic stenosis has significantly progressed since 2017.  Dr. Curt Bears recommended permanent pacemaker placement due to severe conduction disease and this was placed at the end of February. He eventually had [employer] Jude MRI compatible dual-chamber pacemaker implanted on 03/23/2017.  He was evaluated by Dr. Burt Knack for TAVR and eventually underwent the procedure with Dr. Cyndia Bent and Dr. Burt Knack with placement of Edwards Sapien 3 THV size 26 mm bioprosthetic valve on 05/09/17.  He had no complications. Follow up Echo in May 2019 showed a good result. ASA was discontinued in November. On Eliquis. His last pacemaker check in March was functioning normally. Rate responsiveness  was increased to see if this would help with dyspnea.   The patient {does/does not:200015} have symptoms concerning for COVID-19 infection (fever, chills, cough, or new shortness of breath).    Past Medical History:  Diagnosis Date  . Anemia   . Carotid arterial disease ([place])   . CHF (congestive heart failure) ([place])   . Coronary artery disease   . HOH (hard of hearing)   . Hyperlipidemia   . Hypertension   . Nocturia   . Obesity   . Osteoarthritis    "BUE; BLE; right knee" (03/23/2017)  . Peripheral vision loss, bilateral    S/P 07/2015  . Permanent atrial fibrillation   . 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 Severe aortic stenosis   . Sinus drainage   . Stroke [facility]) 07/2015   "peripheral vision still bad out of left eye" (03/23/2017)  . Torn rotator cuff 2012   Right arm  . Type II diabetes mellitus ([place])   . Urinary frequency    Past Surgical History:  Procedure Laterality Date  . BACK SURGERY    . CARDIAC CATHETERIZATION  11/29/2005   SEVERE THREE VESSEL OBSTRUCTIVE ATHERSCLEROTIC CAD. ALL GRAFTS WERE PATENT. EF 65%  . CARDIAC CATHETERIZATION  03/23/2017  . CAROTID ENDARTERECTOMY Right 01-29-08   cea  . CARPAL TUNNEL RELEASE Right   . CORONARY ARTERY BYPASS GRAFT  03/2003   X3. LIMA GRAFT TO THE LAD, SAPHENOUS VEIN GRAFT TO THE PA, AND A LEFT RADIAL GRAFT TO THEOBTUSE MARGINAL VESSEL  . ENDARTERECTOMY Left 05/28/2012   Procedure: ENDARTERECTOMY CAROTID;  Surgeon: Elam Dutch, MD;  Location: [facility] OR;  Service: Vascular;  Laterality: Left;  . INSERT / REPLACE / REMOVE PACEMAKER  03/23/2017  . [place]; 1984  . MOLE SURGERY     "? face/arms"  . PACEMAKER IMPLANT N/A 03/23/2017   Procedure: PACEMAKER IMPLANT;  Surgeon: Constance Haw, MD;  Location: [place] CV LAB;  Service: Cardiovascular;  Laterality: N/A;  . PATCH ANGIOPLASTY Left 05/28/2012   Procedure: WITH dACRON PATCH ANGIOPLASTY;  Surgeon: Elam Dutch, MD;  Location:  [place];  Service: Vascular;  Laterality: Left;  . RIGHT/LEFT HEART CATH AND CORONARY/GRAFT ANGIOGRAPHY N/A 03/23/2017   Procedure: RIGHT/LEFT HEART CATH AND CORONARY/GRAFT ANGIOGRAPHY;  Surgeon: Sherren Mocha, MD;  Location: [place] CV LAB;  Service: Cardiovascular;  Laterality: N/A;  . TEE WITHOUT CARDIOVERSION N/A 05/09/2017   Procedure: TRANSESOPHAGEAL ECHOCARDIOGRAM (TEE);  Surgeon: Sherren Mocha, MD;  Location: [place];  Service: Open Heart Surgery;  Laterality: N/A;  . TRANSCATHETER AORTIC VALVE REPLACEMENT, TRANSFEMORAL N/A 05/09/2017   Procedure: TRANSCATHETER AORTIC VALVE REPLACEMENT, TRANSFEMORAL using an Edwards 42mm Sapien 3 Aortic Valve;  Surgeon: Sherren Mocha, MD;  Location: [place];  Service: Open Heart Surgery;  Laterality: N/A;     No outpatient medications have been marked as taking for the 05/28/18 encounter (Appointment) with [name], Kanae Ignatowski M, MD.     Allergies:   Niacin and related; Metformin and related; and Statins   Social History   Tobacco Use  . Smoking status: Former Smoker    Packs/day: 2.00    Years: 39.00    Pack years: 78.00    Types: Cigarettes    Last attempt to quit: 01/24/1994    Years since quitting: 24.3  . Smokeless tobacco: Never Used  Substance Use Topics  . Alcohol use: No  . Drug use: No     Family Hx: The patient's family history includes Arrhythmia in his brother; Cancer in his brother; Heart attack in his brother and father; Parkinsonism in his brother.  ROS:   Please see the history of present illness.    *** All other systems reviewed and are negative.   Prior CV studies:   The following studies were reviewed today:  Echo: 06/14/17: Study Conclusions  - Left ventricle: The cavity size was normal. Wall thickness was   increased in a pattern of moderate LVH. Indeterminant diastolic   function. The estimated ejection fraction was 55%. Although no   diagnostic regional wall motion abnormality was identified, this   possibility  cannot be completely excluded on the basis of this   study. - Aortic valve: Bioprosthetic aortic valve s/p TAVR. There was no   stenosis. There was no regurgitation. Mean gradient (S): 12 mm   Hg. - Mitral valve: Moderately calcified annulus. - Left atrium: The atrium was severely dilated. - Right ventricle: The cavity size was mildly dilated. Pacer wire   or catheter noted in right ventricle. Systolic function was   normal. - Right atrium: The atrium was mildly dilated. - Tricuspid valve: Peak RV-RA gradient (S): 36 mm Hg. - Pulmonary arteries: PA peak pressure: 39 mm Hg (S). - Inferior vena cava: The vessel was normal in size. The   respirophasic diameter changes were in the normal range (>= 50%),   consistent with normal central venous pressure.  Impressions:  - Normal LV size with moderate LV hypertrophy. EF 55%. Mildly   dilated RV with normal systolic function. Bioprosthetic aortic   valve s/p TAVR, no significant stenosis or regurgitation. Mild  pulmonary hypertension.  Labs/Other Tests and Data Reviewed:    EKG:  {EKG/Telemetry Strips Reviewed:[phone]}  Recent Labs: 01/29/2018: ALT 15 03/02/2018: BUN 22; Creatinine, Ser 1.46; Hemoglobin 10.0; Platelets 253; Potassium 4.8; Sodium 137   Recent Lipid Panel Lab Results  Component Value Date/Time   CHOL 104 01/29/2018 12:00 AM   TRIG 75 01/29/2018 12:00 AM   HDL 45 01/29/2018 12:00 AM   CHOLHDL 2.3 01/29/2018 12:00 AM   CHOLHDL 2.3 12/11/2015 08:54 AM   LDLCALC 44 01/29/2018 12:00 AM    Wt Readings from Last 3 Encounters:  05/16/18 263 lb (119.3 kg)  03/27/18 269 lb (122 kg)  01/09/18 270 lb 6.4 oz (122.7 kg)     Objective:    Vital Signs:  There were no vitals taken for this visit.   {[facility] Virtual Exam (Optional):[phone]::"VITAL SIGNS:  reviewed"}  ASSESSMENT & PLAN:     1. CAD s/p CABG: Patient underwent a cardiac catheterization in February 2019, grafts still patent. Continued medical therapy  was recommended.    2. Severe aortic stenosis s/p TAVR in April 2019: Echo in May showed good valve function. On Eliquis. ASA discontinued.   3. History of permanent atrial fibrillation with slow ventricular rate: S/p pacemaker.  On Eliquis 5 mg twice daily.  4.   Volume overload due to diastolic dysfunction. Will add lasix 20 mg daily. Restrict sodium intake. Check labs in 2-3 weeks. Follow up in 3 months.  4. Hypertension: BP control has improved with addition of hydralazine. Will also add lasix today as noted above.   5. Hyperlipidemia: on Crestor with excellent lipids. With leg pain will reduce dose to 10 mg daily. Repeat lipids with upcoming lab work  6. Carotid artery disease: History of left carotid endarterectomy in 2014.  Stable on last ultrasound 01/10/2017.  7.   S/p right PCA CVA with visual cut.   COVID-19 Education: The signs and symptoms of COVID-19 were discussed with the patient and how to seek care for testing (follow up with PCP or arrange E-visit).  ***The importance of social distancing was discussed today.  Time:   Today, I have spent *** minutes with the patient with telehealth technology discussing the above problems.     Medication Adjustments/Labs and Tests Ordered: Current medicines are reviewed at length with the patient today.  Concerns regarding medicines are outlined above.   Tests Ordered: No orders of the defined types were placed in this encounter.   Medication Changes: No orders of the defined types were placed in this encounter.   Disposition:  Follow up {follow up:15908}  Signed, Shayna Eblen [name], MD  05/23/2018 2:48 PM    [facility]

## 2018-05-25 ENCOUNTER — Telehealth: Payer: Self-pay | Admitting: Cardiology

## 2018-05-25 NOTE — Telephone Encounter (Signed)
New message   Patient's wife is returning call to setup a virtual visit. Please call.

## 2018-05-25 NOTE — Telephone Encounter (Signed)
Please contact patient back to set up for virtual visit for Monday.  Thank you!

## 2018-05-28 ENCOUNTER — Telehealth: Payer: Self-pay | Admitting: Cardiology

## 2018-05-28 ENCOUNTER — Telehealth: Payer: Medicare Other | Admitting: Cardiology

## 2018-05-28 NOTE — Telephone Encounter (Signed)
Wife of Pt called to r/s the appt. She was hoping to get the Pt in after May 11, but Dr. Doug Sou schedule was full. She can do a virtual visit, but prefers to see him in person if possible.

## 2018-05-28 NOTE — Telephone Encounter (Signed)
Returned call to patient's wife no answer.LMTC. 

## 2018-05-28 NOTE — Telephone Encounter (Signed)
Spoke to wife ,appt schedule for 08/06/18 at 1:20 pm , RN offered sooner appt ,but it was to early in the day for patient Patient recently saw extEnder -TAVR in 4/ 22/20

## 2018-05-28 NOTE — Telephone Encounter (Signed)
New Message   Patient would like to speak to a nurse regarding appointment.

## 2018-05-30 NOTE — Telephone Encounter (Signed)
Returned call to patient's wife.She stated patient is doing well.Stated he will keep appointment as planned with Dr.Jordan 08/06/18 at 1:20 pm.

## 2018-05-30 NOTE — Telephone Encounter (Signed)
Follow up  ° ° °Patients wife is returning call.  °

## 2018-05-30 NOTE — Telephone Encounter (Signed)
Called wife left message on personal voice mail patient's appointment already rescheduled to 08/06/18 at 1:20 pm.Advised to call back if he needs a sooner appointment.

## 2018-06-05 ENCOUNTER — Other Ambulatory Visit: Payer: Self-pay

## 2018-06-05 MED ORDER — AMLODIPINE BESYLATE 5 MG PO TABS: 5.0000 mg | ORAL_TABLET | Freq: Every day | ORAL | 3 refills | Status: DC

## 2018-06-06 ENCOUNTER — Other Ambulatory Visit: Payer: Self-pay

## 2018-06-11 ENCOUNTER — Telehealth: Payer: Self-pay | Admitting: Cardiology

## 2018-06-11 NOTE — Telephone Encounter (Signed)
Patient has a broke blood vessel in his eye, wife is concerned since he had a brain bleed a couple of years ago.

## 2018-06-11 NOTE — Telephone Encounter (Signed)
Returned call to patient's wife Dr.Jordan's recommendation given.While talking to wife phone disconnected.Called back wife left Dr.Jordan's advice on her personal voice mail.

## 2018-06-11 NOTE — Telephone Encounter (Signed)
Spoke with pt's wife and wife has concerns .Pt awoke this am with broken blood vessel in right eye and wife informed me that pt had brain bleed in 2017 and was just calling with concerns. Pt had blurred vision with that episode back in 2017 and bleed was discovered by  head CT.Pt has no other symptoms and per wife has not fallen that she is aware of . Pt does suffer from allergies .Will forward to Dr [name] for review .Adonis Housekeeper

## 2018-06-11 NOTE — Telephone Encounter (Signed)
If his vision is not affected I would just observe since this is likely superficial. If his vision is affected he should see his eye doctor  Alleene Stoy [name] MD, [facility]

## 2018-06-19 ENCOUNTER — Ambulatory Visit (INDEPENDENT_AMBULATORY_CARE_PROVIDER_SITE_OTHER): Payer: Medicare Other | Admitting: *Deleted

## 2018-06-19 DIAGNOSIS — I4821 Permanent atrial fibrillation: Secondary | ICD-10-CM | POA: Diagnosis not present

## 2018-06-19 DIAGNOSIS — Z8673 Personal history of transient ischemic attack (TIA), and cerebral infarction without residual deficits: Secondary | ICD-10-CM

## 2018-06-19 LAB — CUP PACEART REMOTE DEVICE CHECK
Date Time Interrogation Session: 20200526125242
Implantable Lead Implant Date: 20190228
Implantable Lead Location: 753860
Implantable Pulse Generator Implant Date: 20190228
Pulse Gen Model: 1272
Pulse Gen Serial Number: 8971140

## 2018-06-28 NOTE — Progress Notes (Signed)
Remote pacemaker transmission.   

## 2018-07-05 ENCOUNTER — Other Ambulatory Visit: Payer: Self-pay | Admitting: Cardiology

## 2018-07-18 ENCOUNTER — Encounter: Payer: Self-pay | Admitting: Thoracic Surgery (Cardiothoracic Vascular Surgery)

## 2018-08-03 NOTE — Progress Notes (Signed)
Cardiology Office Note    Date:  08/06/2018   ID:  Evan [name], DOB 17-Nov-1940, MRN [id number]  PCP:  Kathlyn Sacramento, MD  Cardiologist: Dr. [name] Electrophysiologist Dr. Curt Bears [place] specialist: Dr. Burt Knack  Chief Complaint  Patient presents with   Follow-up   Shortness of Breath   Edema    Feet.    History of Present Illness:  Evan [name] is a 78 y.o. male seen for follow up CAD and prior TAVR. He has a  past medical history of CAD s/p CABG 2005, hypertension, hyperlipidemia, carotid artery disease, permanent atrial fibrillation, severe aortic stenosis s/p TAVR, DM 2, and pacemaker.  Cardiac catheterization in 2007 demonstrated patent grafts.  Myoview in 2013 showed no significant abnormality.  He had left carotid enterectomy in May 2014.  He is also intolerant to statins due to severe myalgia and weakness.  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  Sleep study in October 2017 only showed borderline sleep apnea with minimal desaturation.    Follow-up echocardiogram obtained on 03/01/2017 showed the patient's aortic stenosis has significantly progressed since 2017.  Dr. Curt Bears recommended permanent pacemaker placement due to severe conduction disease and this was placed at the end of February. He eventually had [employer] Jude MRI compatible dual-chamber pacemaker implanted on 03/23/2017.  He was evaluated by Dr. Burt Knack for TAVR and eventually underwent the procedure with Dr. Cyndia Bent and Dr. Burt Knack with placement of Edwards Sapien 3 THV size 26 mm bioprosthetic valve on 05/09/17.  He had no complications. Follow up Echo in May showed a good result. ASA was discontinued in November. On Eliquis.  Since last visit he had cataract surgery. Seen by Almyra Deforest PA-C in November and BP elevated. Hydralazine added for BP control. In December he  was seen with increased weight gain and edema and was started on lasix. Seen by Dr. Curt Bears in March and complained of SOB. CXR was unremarkable. Rate responsiveness adjusted on pacemaker. Normal remote pacemaker check in May.   He is seen today with his wife. He thinks breathing is improved but still gets SOB if he walks fast. Notes LE edema if he doesn't elevate feet. Has difficulty sleeping and often sleeps in a chair- feet swell more then.  No chest pain or palpitations. Fairly inactive except he does mow his grass.    Past Medical History:  Diagnosis Date   Anemia    Carotid arterial disease (HCC)    CHF (congestive heart failure) (HCC)    Coronary artery disease    HOH (hard of hearing)    Hyperlipidemia    Hypertension    Nocturia    Obesity    Osteoarthritis    "BUE; BLE; right knee" (03/23/2017)   Peripheral vision loss, bilateral    S/P 07/2015   Permanent atrial fibrillation    Presence of permanent cardiac pacemaker 03/23/2017   Severe aortic stenosis    Sinus drainage    Stroke ([place]) 07/2015   "peripheral vision still bad out of left eye" (03/23/2017)   Torn rotator cuff 2012   Right arm   Type II diabetes mellitus ([place])    Urinary frequency     Past Surgical History:  Procedure Laterality Date   BACK SURGERY     CARDIAC CATHETERIZATION  11/29/2005   SEVERE THREE VESSEL OBSTRUCTIVE ATHERSCLEROTIC CAD.  ALL GRAFTS WERE PATENT. EF 65%   CARDIAC CATHETERIZATION  03/23/2017   CAROTID ENDARTERECTOMY Right 01-29-08   cea   CARPAL TUNNEL RELEASE Right    CORONARY ARTERY BYPASS GRAFT  03/2003   X3. LIMA GRAFT TO THE LAD, SAPHENOUS VEIN GRAFT TO THE PA, AND A LEFT RADIAL GRAFT TO THEOBTUSE MARGINAL VESSEL   ENDARTERECTOMY Left 05/28/2012   Procedure: ENDARTERECTOMY CAROTID;  Surgeon: Elam Dutch, MD;  Location: [facility];  Service: Vascular;  Laterality: Left;   INSERT / REPLACE / REMOVE PACEMAKER  03/23/2017   LUMBAR [place]; 1984     MOLE SURGERY     "? face/arms"   PACEMAKER IMPLANT N/A 03/23/2017   Procedure: PACEMAKER IMPLANT;  Surgeon: Constance Haw, MD;  Location: [facility];  Service: Cardiovascular;  Laterality: N/A;   PATCH ANGIOPLASTY Left 05/28/2012   Procedure: WITH dACRON PATCH ANGIOPLASTY;  Surgeon: Elam Dutch, MD;  Location: [facility];  Service: Vascular;  Laterality: Left;   RIGHT/LEFT HEART CATH AND CORONARY/GRAFT ANGIOGRAPHY N/A 03/23/2017   Procedure: RIGHT/LEFT HEART CATH AND CORONARY/GRAFT ANGIOGRAPHY;  Surgeon: Sherren Mocha, MD;  Location: [facility];  Service: Cardiovascular;  Laterality: N/A;   TEE WITHOUT CARDIOVERSION N/A 05/09/2017   Procedure: TRANSESOPHAGEAL ECHOCARDIOGRAM (TEE);  Surgeon: Sherren Mocha, MD;  Location: [facility];  Service: Open Heart Surgery;  Laterality: N/A;   TRANSCATHETER AORTIC VALVE REPLACEMENT, TRANSFEMORAL N/A 05/09/2017   Procedure: TRANSCATHETER AORTIC VALVE REPLACEMENT, TRANSFEMORAL using an Edwards 47mm Sapien 3 Aortic Valve;  Surgeon: Sherren Mocha, MD;  Location: [facility];  Service: Open Heart Surgery;  Laterality: N/A;    Current Medications: Outpatient Medications Prior to Visit  Medication Sig Dispense Refill   acetaminophen (TYLENOL) 650 MG CR tablet Take 1,300 mg by mouth every 8 (eight) hours as needed for pain.      amLODipine (NORVASC) 5 MG tablet Take 1 tablet (5 mg total) by mouth daily. 90 tablet 3   benazepril (LOTENSIN) 40 MG tablet Take 1 tablet (40 mg total) by mouth daily. 90 tablet 0   Camphor-Menthol-Methyl Sal (MUSCLE RUB ULTRA STRENGTH) 05-03-28 % CREA Apply 1 application topically 3 (three) times daily as needed (muscle pain).     Cyanocobalamin (RA VITAMIN B-12 TR) 1000 MCG TBCR Take 1,000 mcg by mouth daily.      ELIQUIS 5 MG TABS tablet TAKE 1 TABLET BY MOUTH EVERY 12 HOURS 60 tablet 5   ezetimibe (ZETIA) 10 MG tablet Take 1 tablet (10 mg total) by mouth daily. 90 tablet 3   fluticasone (FLONASE) 50  MCG/ACT nasal spray Place 2 sprays into both nostrils daily.     furosemide (LASIX) 20 MG tablet Take 1 tablet (20 mg total) by mouth daily. 90 tablet 3   glimepiride (AMARYL) 2 MG tablet TAKE 1 TABLET (2 MG TOTAL) BY MOUTH DAILY WITH BREAKFAST 90 tablet 0   guaiFENesin-codeine 100-10 MG/5ML syrup Take 5 mLs by mouth every 6 (six) hours as needed for cough.      hydrALAZINE (APRESOLINE) 25 MG tablet Take 1 tablet (25 mg total) by mouth 2 (two) times daily. 60 tablet 11   hydrochlorothiazide (HYDRODIURIL) 25 MG tablet TAKE 1 TABLET BY MOUTH EVERY DAY IN THE MORNING 90 tablet 0   OVER THE COUNTER MEDICATION Replenex tablets: Take 3 tablet by mouth every morning (joint health)     Polyvinyl Alcohol-Povidone (REFRESH OP) Place 2 drops into both eyes daily as needed (for dry eyes).  tamsulosin (FLOMAX) 0.4 MG CAPS capsule TAKE 1 CAPSULE BY MOUTH ONCE DAILY (Patient taking differently: TAKE 0.4 MG BY MOUTH ONCE DAILY IN THE EVENING) 90 capsule 3   No facility-administered medications prior to visit.      Allergies:   Niacin and related, Metformin and related, and Statins   Social History   Socioeconomic History   Marital status: Married    Spouse name: Not on file   Number of children: 5   Years of education: Not on file   Highest education level: Not on file  Occupational History   Occupation: retired    Fish farm manager: RETIRED  Scientist, product/process development strain: Not on file   Food insecurity    Worry: Not on file    Inability: Not on file   Transportation needs    Medical: Not on file    Non-medical: Not on file  Tobacco Use   Smoking status: Former Smoker    Packs/day: 2.00    Years: 39.00    Pack years: 78.00    Types: Cigarettes    Quit date: 01/24/1994    Years since quitting: 24.5   Smokeless tobacco: Never Used  Substance and Sexual Activity   Alcohol use: No   Drug use: No   Sexual activity: Never  Lifestyle   Physical activity    Days per  week: Not on file    Minutes per session: Not on file   Stress: Not on file  Relationships   Social connections    Talks on phone: Not on file    Gets together: Not on file    Attends religious service: Not on file    Active member of club or organization: Not on file    Attends meetings of clubs or organizations: Not on file    Relationship status: Not on file  Other Topics Concern   Not on file  Social History Narrative   Not on file     Family History:  The patient's family history includes Arrhythmia in his brother; Cancer in his brother; Heart attack in his brother and father; Parkinsonism in his brother.   ROS:   Please see the history of present illness.    ROS All other systems reviewed and are negative.   PHYSICAL EXAM:   VS:  BP (!) 126/52 (BP Location: Left Arm, Patient Position: Sitting, Cuff Size: Normal)    Pulse 64    Temp (!) 97.5 F (36.4 C)    Ht 6' (1.829 m)    Wt 267 lb (121.1 kg)    SpO2 91%    BMI 36.21 kg/m    GEN: Well nourished, obese, in no acute distress  HEENT: normal  Neck: no JVD, carotid bruits, or masses Cardiac: RRR; gr 1/6 SEM, rubs, or gallops, no edema  +pacemaker present Respiratory:  clear to auscultation bilaterally, normal work of breathing GI: soft, nontender, nondistended, + BS MS: no deformity or atrophy  Skin: warm and dry, no rash Neuro:  Alert and Oriented x 3, Strength and sensation are intact Psych: euthymic mood, full affect  Wt Readings from Last 3 Encounters:  08/06/18 267 lb (121.1 kg)  05/16/18 263 lb (119.3 kg)  03/27/18 269 lb (122 kg)      Studies/Labs Reviewed:   EKG:  EKG is not ordered today.   Recent Labs: 01/29/2018: ALT 15 03/02/2018: BUN 22; Creatinine, Ser 1.46; Hemoglobin 10.0; Platelets 253; Potassium 4.8; Sodium 137   Lipid Panel  Component Value Date/Time   CHOL 104 01/29/2018 0000   TRIG 75 01/29/2018 0000   HDL 45 01/29/2018 0000   CHOLHDL 2.3 01/29/2018 0000   CHOLHDL 2.3 12/11/2015  0854   VLDL 12 12/11/2015 0854   LDLCALC 44 01/29/2018 0000   Labs dated 07/31/17; Hgb 10.6. Glucose 137. Otherwise CMET normal. Cholesterol 110, triglycerides 74, HDL 49, LDL 46.   Additional studies/ records that were reviewed today include:   Cath 03/23/2017 Conclusion     Mid RCA lesion is 100% stenosed.  Ost LM to Dist LM lesion is 70% stenosed.  Prox Cx lesion is 90% stenosed.  Ost 2nd Mrg lesion is 100% stenosed.  Prox LAD-1 lesion is 90% stenosed.  Prox LAD-2 lesion is 100% stenosed.  LIMA and is normal in caliber.  The graft exhibits no disease.  SVG graft was visualized by angiography and is normal in caliber.  Left radial artery graft was visualized by angiography.   1.  Severe native three-vessel coronary artery disease with total occlusion of the RCA, severe left main stenosis, total occlusion of the LAD with severe calcification, and severe stenosis of the circumflex and its branch vessels.  2.  Status post aortocoronary bypass surgery with continued patency of the LIMA to LAD, continued patency of the free radial graft to OM with tight stenosis at the distal anastomosis involving a small caliber vessel, and continued patency of the saphenous vein graft to PDA without any significant disease.  3.  Severe aortic valve stenosis with a mean gradient of 40 mmHg.  The calculated valve area is 1.74 cm likely related to high cardiac output  4.  Moderate pulmonary hypertension with a PA pressure of 72/25 with a mean of 38   Recommendations: The patient will undergo permanent pacemaker placement for symptomatic atrial fibrillation with slow ventricular rate.  He will continue to undergo evaluation by the multidisciplinary heart team for consideration of treatment options for his severe aortic stenosis.      Limited echo 05/10/2017 LV EF: 60% -   65% Study Conclusions  - Left ventricle: The cavity size was normal. Systolic function was   normal. The  estimated ejection fraction was in the range of 60%   to 65%. Wall motion was normal; there were no regional wall   motion abnormalities. - Ventricular septum: The contour showed diastolic flattening. - Aortic valve: A TAVR bioprosthesis was present. There was no   regurgitation. Peak velocity (S): 207 cm/s. Mean gradient (S): 7   mm Hg. Valve area (VTI): 1.65 cm^2. Valve area (Vmax): 1.44 cm^2.   Valve area (Vmean): 1.77 cm^2. - Mitral valve: Moderately calcified annulus. - Pulmonary arteries: Systolic pressure was moderately to severely   increased. PA peak pressure: 65 mm Hg (S).  Echo 06/14/17:Study Conclusions  - Left ventricle: The cavity size was normal. Wall thickness was   increased in a pattern of moderate LVH. Indeterminant diastolic   function. The estimated ejection fraction was 55%. Although no   diagnostic regional wall motion abnormality was identified, this   possibility cannot be completely excluded on the basis of this   study. - Aortic valve: Bioprosthetic aortic valve s/p TAVR. There was no   stenosis. There was no regurgitation. Mean gradient (S): 12 mm   Hg. - Mitral valve: Moderately calcified annulus. - Left atrium: The atrium was severely dilated. - Right ventricle: The cavity size was mildly dilated. Pacer wire   or catheter noted in right ventricle. Systolic function was  normal. - Right atrium: The atrium was mildly dilated. - Tricuspid valve: Peak RV-RA gradient (S): 36 mm Hg. - Pulmonary arteries: PA peak pressure: 39 mm Hg (S). - Inferior vena cava: The vessel was normal in size. The   respirophasic diameter changes were in the normal range (>= 50%),   consistent with normal central venous pressure.  Impressions:  - Normal LV size with moderate LV hypertrophy. EF 55%. Mildly   dilated RV with normal systolic function. Bioprosthetic aortic   valve s/p TAVR, no significant stenosis or regurgitation. Mild   pulmonary hypertension.       ASSESSMENT:    1. Permanent atrial fibrillation   2. S/p TAVR (transcatheter aortic valve replacement), bioprosthetic   3. Coronary artery disease involving coronary bypass graft of native heart without angina pectoris   4. SOB (shortness of breath)   5. S/P placement of cardiac pacemaker      PLAN:  In order of problems listed above:  1. CAD s/p CABG: Patient underwent a cardiac catheterization in February 2019, grafts still patent. Continued medical therapy was recommended.    2. Severe aortic stenosis s/p TAVR in April 2019: Echo in May 2019 showed good valve function. On Eliquis for Afib. ASA discontinued. Repeat Echo August 3.   3. History of permanent atrial fibrillation with slow ventricular rate: S/p pacemaker.  On Eliquis 5 mg twice daily.  4.   Volume overload due to diastolic dysfunction. On lasix and HCTZ. Needs to restrict salt intake more and elevate feet.   4. Hypertension: BP well controlled.   5. Hyperlipidemia: on Crestor- follow up lab work today  6. Carotid artery disease: History of left carotid endarterectomy in 2014.  Stable on last ultrasound 01/10/2017. Will update  7.   S/p right PCA CVA with visual cut.     Medication Adjustments/Labs and Tests Ordered: Current medicines are reviewed at length with the patient today.  Concerns regarding medicines are outlined above.  Medication changes, Labs and Tests ordered today are listed in the Patient Instructions below. Patient Instructions  Keep feet elevated when you can  Restrict your salt intake  Continue your current medication  We will see what your Echo shows next month.  We will check your carotid arteries       Signed, Kort Stettler [name], MD  08/06/2018 1:41 PM    [facility] [address], [place], [place]  [address] Phone: [phone]; Fax: [phone]

## 2018-08-06 ENCOUNTER — Other Ambulatory Visit: Payer: Self-pay

## 2018-08-06 ENCOUNTER — Encounter: Payer: Self-pay | Admitting: Cardiology

## 2018-08-06 ENCOUNTER — Ambulatory Visit (INDEPENDENT_AMBULATORY_CARE_PROVIDER_SITE_OTHER): Payer: Medicare Other | Admitting: Cardiology

## 2018-08-06 VITALS — BP 126/52 | HR 64 | Temp 97.5°F | Ht 72.0 in | Wt 267.0 lb

## 2018-08-06 DIAGNOSIS — Z953 Presence of xenogenic heart valve: Secondary | ICD-10-CM | POA: Diagnosis not present

## 2018-08-06 DIAGNOSIS — Z950 Presence of cardiac pacemaker: Secondary | ICD-10-CM

## 2018-08-06 DIAGNOSIS — I4821 Permanent atrial fibrillation: Secondary | ICD-10-CM | POA: Diagnosis not present

## 2018-08-06 DIAGNOSIS — I25810 Atherosclerosis of coronary artery bypass graft(s) without angina pectoris: Secondary | ICD-10-CM

## 2018-08-06 DIAGNOSIS — R0602 Shortness of breath: Secondary | ICD-10-CM | POA: Diagnosis not present

## 2018-08-06 DIAGNOSIS — I779 Disorder of arteries and arterioles, unspecified: Secondary | ICD-10-CM

## 2018-08-06 DIAGNOSIS — I739 Peripheral vascular disease, unspecified: Secondary | ICD-10-CM

## 2018-08-06 NOTE — Patient Instructions (Addendum)
Keep feet elevated when you can  Restrict your salt intake  Continue your current medication  We will see what your Echo shows next month.  We will check your carotid arteries

## 2018-08-07 LAB — HEPATIC FUNCTION PANEL
ALT: 21 IU/L (ref 0–44)
AST: 19 IU/L (ref 0–40)
Albumin: 4.6 g/dL (ref 3.7–4.7)
Alkaline Phosphatase: 79 IU/L (ref 39–117)
Bilirubin Total: 0.7 mg/dL (ref 0.0–1.2)
Bilirubin, Direct: 0.2 mg/dL (ref 0.00–0.40)
Total Protein: 7 g/dL (ref 6.0–8.5)

## 2018-08-07 LAB — BASIC METABOLIC PANEL
BUN / CREAT RATIO: 15 (ref 10–24)
BUN: 22 mg/dL (ref 8–27)
CO2: 21 mmol/L (ref 20–29)
CREATININE: 1.42 mg/dL — AB (ref 0.76–1.27)
Calcium: 9.5 mg/dL (ref 8.6–10.2)
Chloride: 101 mmol/L (ref 96–106)
GFR calc non Af Amer: 47 mL/min/{1.73_m2} — ABNORMAL LOW (ref >59)
GFR, EST AFRICAN AMERICAN: 55 mL/min/{1.73_m2} — AB (ref >59)
Glucose: 136 mg/dL — ABNORMAL HIGH (ref 65–99)
Potassium: 4.9 mmol/L (ref 3.5–5.2)
Sodium: 140 mmol/L (ref 134–144)

## 2018-08-07 LAB — LIPID PANEL
Chol/HDL Ratio: 3.6 ratio (ref 0.0–5.0)
Cholesterol, Total: 152 mg/dL (ref 100–199)
HDL: 42 mg/dL (ref >39)
LDL CALC: 89 mg/dL (ref 0–99)
Triglycerides: 107 mg/dL (ref 0–149)
VLDL CHOLESTEROL CAL: 21 mg/dL (ref 5–40)

## 2018-08-27 ENCOUNTER — Inpatient Hospital Stay (HOSPITAL_COMMUNITY): Admission: RE | Admit: 2018-08-27 | Payer: Medicare Other | Source: Ambulatory Visit

## 2018-08-27 ENCOUNTER — Ambulatory Visit (HOSPITAL_COMMUNITY): Payer: Medicare Other | Attending: Cardiology

## 2018-08-27 ENCOUNTER — Other Ambulatory Visit: Payer: Self-pay

## 2018-08-27 DIAGNOSIS — Z952 Presence of prosthetic heart valve: Secondary | ICD-10-CM

## 2018-08-28 ENCOUNTER — Other Ambulatory Visit: Payer: Self-pay | Admitting: Cardiology

## 2018-08-28 ENCOUNTER — Ambulatory Visit (HOSPITAL_COMMUNITY)
Admission: RE | Admit: 2018-08-28 | Discharge: 2018-08-28 | Disposition: A | Discharge status: Home / Self Care | Payer: Medicare Other | Source: Ambulatory Visit | Attending: Cardiovascular Disease | Admitting: Cardiovascular Disease

## 2018-08-28 DIAGNOSIS — I6522 Occlusion and stenosis of left carotid artery: Secondary | ICD-10-CM

## 2018-08-28 DIAGNOSIS — I739 Peripheral vascular disease, unspecified: Secondary | ICD-10-CM | POA: Diagnosis present

## 2018-08-28 DIAGNOSIS — I25810 Atherosclerosis of coronary artery bypass graft(s) without angina pectoris: Secondary | ICD-10-CM | POA: Diagnosis present

## 2018-08-28 DIAGNOSIS — Z953 Presence of xenogenic heart valve: Secondary | ICD-10-CM | POA: Insufficient documentation

## 2018-08-28 DIAGNOSIS — R0602 Shortness of breath: Secondary | ICD-10-CM | POA: Insufficient documentation

## 2018-08-28 DIAGNOSIS — Z950 Presence of cardiac pacemaker: Secondary | ICD-10-CM | POA: Diagnosis present

## 2018-08-28 DIAGNOSIS — I4821 Permanent atrial fibrillation: Secondary | ICD-10-CM | POA: Diagnosis present

## 2018-08-28 DIAGNOSIS — I779 Disorder of arteries and arterioles, unspecified: Secondary | ICD-10-CM

## 2018-09-04 ENCOUNTER — Telehealth: Payer: Self-pay

## 2018-09-04 NOTE — Telephone Encounter (Signed)
Spoke to patient's wife Eliquis 5 mg samples left at [employer] office front desk.

## 2018-09-19 ENCOUNTER — Ambulatory Visit (INDEPENDENT_AMBULATORY_CARE_PROVIDER_SITE_OTHER): Payer: Medicare Other | Admitting: *Deleted

## 2018-09-19 ENCOUNTER — Other Ambulatory Visit: Payer: Self-pay | Admitting: Cardiology

## 2018-09-19 DIAGNOSIS — R001 Bradycardia, unspecified: Secondary | ICD-10-CM | POA: Diagnosis not present

## 2018-09-19 LAB — CUP PACEART REMOTE DEVICE CHECK
Battery Remaining Longevity: 141 mo
Battery Remaining Percentage: 95.5 %
Battery Voltage: 3.01 V
Brady Statistic RV Percent Paced: 90 %
Date Time Interrogation Session: 20200825123252
Implantable Lead Implant Date: 20190228
Implantable Lead Location: 753860
Implantable Pulse Generator Implant Date: 20190228
Lead Channel Impedance Value: 450 Ohm
Lead Channel Pacing Threshold Amplitude: 0.625 V
Lead Channel Pacing Threshold Pulse Width: 0.5 ms
Lead Channel Sensing Intrinsic Amplitude: 8.5 mV
Lead Channel Setting Pacing Amplitude: 0.875
Lead Channel Setting Pacing Pulse Width: 0.5 ms
Lead Channel Setting Sensing Sensitivity: 2 mV
Pulse Gen Model: 1272
Pulse Gen Serial Number: 8971140

## 2018-09-19 NOTE — Telephone Encounter (Signed)
28m 121.1kg Scr 1.42 08/06/18 Lovw/jordan 08/06/18

## 2018-09-24 ENCOUNTER — Other Ambulatory Visit: Payer: Self-pay | Admitting: Cardiology

## 2018-09-27 ENCOUNTER — Encounter: Payer: Self-pay | Admitting: Cardiology

## 2018-09-27 NOTE — Progress Notes (Signed)
Remote pacemaker transmission.   

## 2018-10-02 ENCOUNTER — Other Ambulatory Visit: Payer: Self-pay | Admitting: Cardiology

## 2018-10-26 ENCOUNTER — Other Ambulatory Visit: Payer: Self-pay | Admitting: Cardiology

## 2018-10-30 ENCOUNTER — Telehealth: Payer: Self-pay | Admitting: Cardiology

## 2018-10-30 NOTE — Telephone Encounter (Signed)
Returned call to patient's wife left message on personal voice mail Eliquis 5 mg samples left at [facility] office front desk.Advised we only give flu vaccines if you have appointment with Dr.Advised to have flu vaccine at PCP.

## 2018-10-30 NOTE — Telephone Encounter (Signed)
New message:    Patient wife would like to know can her husband take a flu shot and would like to know if we have samples. Please call wife on cell phone.

## 2018-12-18 ENCOUNTER — Telehealth: Payer: Self-pay | Admitting: Cardiology

## 2018-12-18 LAB — CUP PACEART REMOTE DEVICE CHECK
Battery Remaining Longevity: 140 mo
Battery Remaining Percentage: 95.5 %
Battery Voltage: 3.01 V
Brady Statistic RV Percent Paced: 90 %
Date Time Interrogation Session: 20201124061513
Implantable Lead Implant Date: 20190228
Implantable Lead Location: 753860
Implantable Pulse Generator Implant Date: 20190228
Lead Channel Impedance Value: 450 Ohm
Lead Channel Pacing Threshold Amplitude: 0.625 V
Lead Channel Pacing Threshold Pulse Width: 0.5 ms
Lead Channel Sensing Intrinsic Amplitude: 7.4 mV
Lead Channel Setting Pacing Amplitude: 0.875
Lead Channel Setting Pacing Pulse Width: 0.5 ms
Lead Channel Setting Sensing Sensitivity: 2 mV
Pulse Gen Model: 1272
Pulse Gen Serial Number: 8971140

## 2018-12-18 NOTE — Telephone Encounter (Signed)
Okay to use Diclofenac gel as prescribed.   AVOID any diclofenac oral

## 2018-12-18 NOTE — Telephone Encounter (Signed)
Will route to PharmD to advise. 

## 2018-12-18 NOTE — Telephone Encounter (Signed)
Patient was given Diclofenac 1% 100gm topical cream to use for his arthritis. One of the warnings states to not use if you have heart issues. Patient's wife would like to make sure it is safe for him to use this. Please advise.

## 2018-12-18 NOTE — Telephone Encounter (Signed)
Called patient and spoke to wife- gave okay per PharmD.  Wife verbalized understanding.

## 2018-12-19 ENCOUNTER — Ambulatory Visit (INDEPENDENT_AMBULATORY_CARE_PROVIDER_SITE_OTHER): Payer: Medicare Other | Admitting: *Deleted

## 2018-12-19 DIAGNOSIS — R001 Bradycardia, unspecified: Secondary | ICD-10-CM | POA: Diagnosis not present

## 2019-01-08 ENCOUNTER — Other Ambulatory Visit: Payer: Self-pay | Admitting: Cardiology

## 2019-01-08 NOTE — Telephone Encounter (Signed)
Rx has been sent to the pharmacy electronically. ° °

## 2019-01-11 ENCOUNTER — Other Ambulatory Visit: Payer: Self-pay | Admitting: Cardiology

## 2019-01-15 NOTE — Progress Notes (Signed)
PPM remote 

## 2019-02-19 NOTE — Progress Notes (Signed)
Cardiology Office Note    Date:  02/28/2019   ID:  Evan [name], DOB 06-25-1940, MRN [id number]  PCP:  Evan Ramsay, MD  Cardiologist: Dr. [name] Electrophysiologist Dr. Curt Bears PV specialist: Dr. Burt [name]  Chief Complaint  Patient presents with  . Coronary Artery Disease  . Aortic Stenosis    History of Present Illness:  Evan [name] is a 79 y.o. male seen for follow up CAD and prior TAVR. He has a  past medical history of CAD s/p CABG 2005, hypertension, hyperlipidemia, carotid artery disease, permanent atrial fibrillation, severe aortic stenosis s/p TAVR, DM 2, and pacemaker.  Cardiac catheterization in 2007 demonstrated patent grafts.  Myoview in 2013 showed no significant abnormality.  He had left carotid enterectomy in May 2014.  He is also intolerant to statins due to severe myalgia and weakness.  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  Sleep study in October 2017 only showed borderline sleep apnea with minimal desaturation.    Follow-up echocardiogram obtained on 03/01/2017 showed the patient's aortic stenosis has significantly progressed since 2017.  Dr. Curt Bears recommended permanent pacemaker placement due to severe conduction disease and this was placed at the end of February. He eventually had St Jude MRI compatible dual-chamber pacemaker implanted on 03/23/2017.  He was evaluated by Dr. Burt [name] for TAVR and eventually underwent the procedure with Dr. Cyndia [name] and Dr. Burt [name] with placement of Edwards Sapien 3 THV size 26 mm bioprosthetic valve on 05/09/17.  He had no complications. Follow up Echo in May showed a good result. ASA was discontinued in November. On Eliquis.  Normal remote pacemaker check in November.   He is seen today with his wife. He  still gets SOB if he walks fast. No significant chest pain or  edema.  No  palpitations.     Past Medical History:  Diagnosis Date  . Anemia   . Carotid arterial disease ([place])   . CHF (congestive heart failure) ([place])   . Coronary artery disease   . HOH (hard of hearing)   . Hyperlipidemia   . Hypertension   . Nocturia   . Obesity   . Osteoarthritis    "BUE; BLE; right knee" (03/23/2017)  . Peripheral vision loss, bilateral    S/P 07/2015  . Permanent atrial fibrillation ([place])   . 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 Severe aortic stenosis   . Sinus drainage   . Stroke [facility]) 07/2015   "peripheral vision still bad out of left eye" (03/23/2017)  . Torn rotator cuff 2012   Right arm  . Type II diabetes mellitus ([place])   . Urinary frequency     Past Surgical History:  Procedure Laterality Date  . BACK SURGERY    . CARDIAC CATHETERIZATION  11/29/2005   SEVERE THREE VESSEL OBSTRUCTIVE ATHERSCLEROTIC CAD. ALL GRAFTS WERE PATENT. EF 65%  . CARDIAC CATHETERIZATION  03/23/2017  . CAROTID ENDARTERECTOMY Right 01-29-08   cea  . CARPAL TUNNEL RELEASE Right   . CORONARY ARTERY BYPASS GRAFT  03/2003   X3. LIMA GRAFT TO THE LAD, SAPHENOUS VEIN GRAFT TO THE PA, AND A LEFT RADIAL GRAFT TO THEOBTUSE MARGINAL VESSEL  . ENDARTERECTOMY Left 05/28/2012   Procedure: ENDARTERECTOMY CAROTID;  Surgeon: Evan Dutch, MD;  Location: [facility];  Service: Vascular;  Laterality: Left;  . INSERT /  REPLACE / REMOVE PACEMAKER  03/23/2017  . [place]; 1984  . MOLE SURGERY     "? face/arms"  . PACEMAKER IMPLANT N/A 03/23/2017   Procedure: PACEMAKER IMPLANT;  Surgeon: Evan Haw, MD;  Location: [facility];  Service: Cardiovascular;  Laterality: N/A;  . PATCH ANGIOPLASTY Left 05/28/2012   Procedure: WITH dACRON PATCH ANGIOPLASTY;  Surgeon: Evan Dutch, MD;  Location: [facility];  Service: Vascular;  Laterality: Left;  . RIGHT/LEFT HEART CATH AND CORONARY/GRAFT ANGIOGRAPHY N/A 03/23/2017   Procedure: RIGHT/LEFT HEART CATH AND  CORONARY/GRAFT ANGIOGRAPHY;  Surgeon: Evan Mocha, MD;  Location: [facility];  Service: Cardiovascular;  Laterality: N/A;  . TEE WITHOUT CARDIOVERSION N/A 05/09/2017   Procedure: TRANSESOPHAGEAL ECHOCARDIOGRAM (TEE);  Surgeon: Evan Mocha, MD;  Location: [facility];  Service: Open Heart Surgery;  Laterality: N/A;  . TRANSCATHETER AORTIC VALVE REPLACEMENT, TRANSFEMORAL N/A 05/09/2017   Procedure: TRANSCATHETER AORTIC VALVE REPLACEMENT, TRANSFEMORAL using an Edwards 29mm Sapien 3 Aortic Valve;  Surgeon: Evan Mocha, MD;  Location: [facility];  Service: Open Heart Surgery;  Laterality: N/A;    Current Medications: Outpatient Medications Prior to Visit  Medication Sig Dispense Refill  . furosemide (LASIX) 20 MG tablet Take by mouth.    . hydrALAZINE (APRESOLINE) 25 MG tablet Take by mouth.    . hydrochlorothiazide (HYDRODIURIL) 25 MG tablet Take by mouth.    . tamsulosin (FLOMAX) 0.4 MG CAPS capsule TAKE 1 CAPSULE BY MOUTH EVERY DAY    . acetaminophen (TYLENOL) 650 MG CR tablet Take 1,300 mg by mouth every 8 (eight) hours as needed for pain.     Marland Kitchen amLODipine (NORVASC) 5 MG tablet Take 1 tablet (5 mg total) by mouth daily. 90 tablet 3  . benazepril (LOTENSIN) 40 MG tablet TAKE 1 TABLET BY MOUTH EVERY DAY 90 tablet 3  . Camphor-Menthol-Methyl Sal (MUSCLE RUB ULTRA STRENGTH) 05-03-28 % CREA Apply 1 application topically 3 (three) times daily as needed (muscle pain).    . Cyanocobalamin (RA VITAMIN B-12 TR) 1000 MCG TBCR Take 1,000 mcg by mouth daily.     . cyanocobalamin 1000 MCG tablet Take by mouth.    Evan [name] 5 MG TABS tablet TAKE 1 TABLET BY MOUTH EVERY 12 HOURS 180 tablet 1  . ezetimibe (ZETIA) 10 MG tablet TAKE 1 TABLET BY MOUTH EVERY DAY 90 tablet 2  . fluticasone (FLONASE) 50 MCG/ACT nasal spray Place 2 sprays into both nostrils daily.    . furosemide (LASIX) 20 MG tablet TAKE 1 TABLET BY MOUTH EVERY DAY 90 tablet 3  . glimepiride (AMARYL) 2 MG tablet TAKE 1 TABLET (2 MG TOTAL) BY  MOUTH DAILY WITH BREAKFAST 90 tablet 0  . guaiFENesin-codeine 100-10 MG/5ML syrup Take 5 mLs by mouth every 6 (six) hours as needed for cough.     . hydrALAZINE (APRESOLINE) 25 MG tablet Take 1 tablet (25 mg total) by mouth 2 (two) times daily. 60 tablet 11  . hydrochlorothiazide (HYDRODIURIL) 25 MG tablet TAKE 1 TABLET BY MOUTH EVERY DAY IN THE MORNING 90 tablet 0  . OVER THE COUNTER MEDICATION Replenex tablets: Take 3 tablet by mouth every morning (joint health)    . Polyvinyl Alcohol-Povidone (REFRESH OP) Place 2 drops into both eyes daily as needed (for dry eyes).     . tamsulosin (FLOMAX) 0.4 MG CAPS capsule TAKE 1 CAPSULE BY MOUTH ONCE DAILY (Patient taking differently: TAKE 0.4 MG BY MOUTH ONCE DAILY IN THE EVENING) 90 capsule 3  No facility-administered medications prior to visit.     Allergies:   Niacin, Niacin and related, Metformin and related, and Statins   Social History   Socioeconomic History  . Marital status: Married    Spouse name: Not on file  . Number of children: 5  . Years of education: Not on file  . Highest education level: Not on file  Occupational History  . Occupation: retired    Fish farm manager: RETIRED  Tobacco Use  . Smoking status: Former Smoker    Packs/day: 2.00    Years: 39.00    Pack years: 78.00    Types: Cigarettes    Quit date: 01/24/1994    Years since quitting: 25.1  . Smokeless tobacco: Never Used  Substance and Sexual Activity  . Alcohol use: No  . Drug use: No  . Sexual activity: Never  Other Topics Concern  . Not on file  Social History Narrative  . Not on file   Social Determinants of Health   Financial Resource Strain:   . Difficulty of Paying Living Expenses: Not on file  Food Insecurity:   . Worried About Charity fundraiser in the Last Year: Not on file  . Ran Out of Food in the Last Year: Not on file  Transportation Needs:   . Lack of Transportation (Medical): Not on file  . Lack of Transportation (Non-Medical): Not on file    Physical Activity:   . Days of Exercise per Week: Not on file  . Minutes of Exercise per Session: Not on file  Stress:   . Feeling of Stress : Not on file  Social Connections:   . Frequency of Communication with Friends and Family: Not on file  . Frequency of Social Gatherings with Friends and Family: Not on file  . Attends Religious Services: Not on file  . Active Member of Clubs or Organizations: Not on file  . Attends Archivist Meetings: Not on file  . Marital Status: Not on file     Family History:  The patient's family history includes Arrhythmia in his brother; Cancer in his brother; Heart attack in his brother and father; Parkinsonism in his brother.   ROS:   Please see the history of present illness.    ROS All other systems reviewed and are negative.   PHYSICAL EXAM:   VS:  BP 115/63   Pulse 64   Temp 97.7 F (36.5 C)   Ht 6' (1.829 m)   Wt 268 lb (121.6 kg)   BMI 36.35 kg/m    GEN: Well nourished, obese, in no acute distress  HEENT: normal  Neck: no JVD, carotid bruits, or masses Cardiac: RRR; gr 1/6 SEM, rubs, or gallops, no edema  +pacemaker present Respiratory:  clear to auscultation bilaterally, normal work of breathing GI: soft, nontender, nondistended, + BS MS: no deformity or atrophy  Skin: warm and dry, no rash Neuro:  Alert and Oriented x 3, Strength and sensation are intact Psych: euthymic mood, full affect  Wt Readings from Last 3 Encounters:  02/28/19 268 lb (121.6 kg)  08/06/18 267 lb (121.1 kg)  05/16/18 263 lb (119.3 kg)      Studies/Labs Reviewed:   EKG:  EKG is  ordered today. V paced at 64 bpm. I have personally reviewed and interpreted this study.   Recent Labs: 03/02/2018: Hemoglobin 10.0; Platelets 253 08/06/2018: ALT 21; BUN 22; Creatinine, Ser 1.42; Potassium 4.9; Sodium 140   Lipid Panel    Component Value  Date/Time   CHOL 152 08/06/2018 1201   TRIG 107 08/06/2018 1201   HDL 42 08/06/2018 1201   CHOLHDL 3.6  08/06/2018 1201   CHOLHDL 2.3 12/11/2015 0854   VLDL 12 12/11/2015 0854   LDLCALC 89 08/06/2018 1201   Labs dated 07/31/17; Hgb 10.6. Glucose 137. Otherwise CMET normal. Cholesterol 110, triglycerides 74, HDL 49, LDL 46. Labs dated 12/07/18: Hgb 9.9. glucose 127. Otherwise CMET normal. A1c 6.5%. cholesterol 139, triglycerides 96, HDL 42, LDL 78.      Additional studies/ records that were reviewed today include:   Cath 03/23/2017 Conclusion     Mid RCA lesion is 100% stenosed.  Ost LM to Dist LM lesion is 70% stenosed.  Prox Cx lesion is 90% stenosed.  Ost 2nd Mrg lesion is 100% stenosed.  Prox LAD-1 lesion is 90% stenosed.  Prox LAD-2 lesion is 100% stenosed.  LIMA and is normal in caliber.  The graft exhibits no disease.  SVG graft was visualized by angiography and is normal in caliber.  Left radial artery graft was visualized by angiography.   1.  Severe native three-vessel coronary artery disease with total occlusion of the RCA, severe left main stenosis, total occlusion of the LAD with severe calcification, and severe stenosis of the circumflex and its branch vessels.  2.  Status post aortocoronary bypass surgery with continued patency of the LIMA to LAD, continued patency of the free radial graft to OM with tight stenosis at the distal anastomosis involving a small caliber vessel, and continued patency of the saphenous vein graft to PDA without any significant disease.  3.  Severe aortic valve stenosis with a mean gradient of 40 mmHg.  The calculated valve area is 1.74 cm likely related to high cardiac output  4.  Moderate pulmonary hypertension with a PA pressure of 72/25 with a mean of 38   Recommendations: The patient will undergo permanent pacemaker placement for symptomatic atrial fibrillation with slow ventricular rate.  He will continue to undergo evaluation by the multidisciplinary heart team for consideration of treatment options for his severe aortic  stenosis.      Limited echo 05/10/2017 LV EF: 60% -   65% Study Conclusions  - Left ventricle: The cavity size was normal. Systolic function was   normal. The estimated ejection fraction was in the range of 60%   to 65%. Wall motion was normal; there were no regional wall   motion abnormalities. - Ventricular septum: The contour showed diastolic flattening. - Aortic valve: A TAVR bioprosthesis was present. There was no   regurgitation. Peak velocity (S): 207 cm/s. Mean gradient (S): 7   mm Hg. Valve area (VTI): 1.65 cm^2. Valve area (Vmax): 1.44 cm^2.   Valve area (Vmean): 1.77 cm^2. - Mitral valve: Moderately calcified annulus. - Pulmonary arteries: Systolic pressure was moderately to severely   increased. PA peak pressure: 65 mm Hg (S).  Echo 06/14/17:Study Conclusions  - Left ventricle: The cavity size was normal. Wall thickness was   increased in a pattern of moderate LVH. Indeterminant diastolic   function. The estimated ejection fraction was 55%. Although no   diagnostic regional wall motion abnormality was identified, this   possibility cannot be completely excluded on the basis of this   study. - Aortic valve: Bioprosthetic aortic valve s/p TAVR. There was no   stenosis. There was no regurgitation. Mean gradient (S): 12 mm   Hg. - Mitral valve: Moderately calcified annulus. - Left atrium: The atrium was severely dilated. -  Right ventricle: The cavity size was mildly dilated. Pacer wire   or catheter noted in right ventricle. Systolic function was   normal. - Right atrium: The atrium was mildly dilated. - Tricuspid valve: Peak RV-RA gradient (S): 36 mm Hg. - Pulmonary arteries: PA peak pressure: 39 mm Hg (S). - Inferior vena cava: The vessel was normal in size. The   respirophasic diameter changes were in the normal range (>= 50%),   consistent with normal central venous pressure.  Impressions:  - Normal LV size with moderate LV hypertrophy. EF 55%. Mildly    dilated RV with normal systolic function. Bioprosthetic aortic   valve s/p TAVR, no significant stenosis or regurgitation. Mild   pulmonary hypertension.    Carotid dopplers 08/28/18: Summary: Right Carotid: Velocities in the right ICA are consistent with a 1-39% stenosis.                The RICA velocities remain within normal range and stable                compared to the prior exam.  Left Carotid: Velocities in the left ICA are consistent with a 1-39% stenosis.               The LICA velocities remain within normal range and stable compared               to the prior exam.  Vertebrals:  Left vertebral artery demonstrates antegrade flow. Right vertebral              artery demonstrates high resistant flow. Subclavians: Right subclavian artery was stenotic. Right subclavian artery flow              was disturbed. Normal flow hemodynamics were seen in the left              subclavian artery. Unable to visualize the right innominate.                Technically challenging exam secondary to short neck and neck              girth.  *See table(s) above for measurements and observations.     Electronically signed by Carlyle Dolly MD on 08/30/2018 at 4:28:17 PM.   Echo 08/27/18: IMPRESSIONS    1. The left ventricle has normal systolic function, with an ejection  fraction of 55-60%. The cavity size was normal. There is moderately  increased left ventricular wall thickness. Left ventricular diastolic  function could not be evaluated secondary to  atrial fibrillation. Elevated mean left atrial pressure.  2. The right ventricle has normal systolic function. The cavity was  moderately enlarged. Right ventricular systolic pressure is mildly  elevated with an estimated pressure of 33.2 mmHg.  3. Left atrial size was severely dilated.  4. Right atrial size was severely dilated.  5. The mitral valve is abnormal. There is moderate mitral annular  calcification present.  6. The  tricuspid valve is grossly normal.  7. A Edwards Sapien bioprosthetic aortic valve (TAVR) valve is present in  the aortic position. Procedure Date: 04/29/2017 Normal aortic valve  prosthesis.  8. The aorta is normal in size and structure.  9. The inferior vena cava was dilated in size with >50% respiratory  variability.  10. Normal LV systolic function; moderate LVH; biatrial enlargement;  moderate RVE; s/p AVR with mean gradient of 17 mmHg and no AI.    ASSESSMENT:    1. Coronary artery disease involving coronary  bypass graft of native heart without angina pectoris   2. S/p TAVR (transcatheter aortic valve replacement), bioprosthetic   3. S/P placement of cardiac pacemaker   4. Left carotid stenosis   5. Essential hypertension      PLAN:  In order of problems listed above:  1. CAD s/p CABG: Patient underwent a cardiac catheterization in February 2019, grafts still patent. Continued medical therapy was recommended.    2. Severe aortic stenosis s/p TAVR in April 2019: Echo in August 2020 showed good valve function. On Eliquis for Afib. ASA discontinued.   3. History of permanent atrial fibrillation with slow ventricular rate: S/p pacemaker.  On Eliquis 5 mg twice daily.  4.   Chronic  diastolic CHF. On lasix and HCTZ. Appears euvolemic. Sodium restriction.  4. Hypertension: BP well controlled.   5. Hyperlipidemia: on Crestor- good control.  6. Carotid artery disease: History of left carotid endarterectomy in 2014.  Stable on last ultrasound August 2020- patent.   7.   S/p right PCA CVA with visual cut.     Medication Adjustments/Labs and Tests Ordered: Current medicines are reviewed at length with the patient today.  Concerns regarding medicines are outlined above.  Medication changes, Labs and Tests ordered today are listed in the Patient Instructions below. There are no Patient Instructions on file for this visit.   Signed, Ondine Gemme [name], MD  02/28/2019 10:41 AM      [facility] [address], [place], [place]  [address] Phone: [phone]; Fax: [phone]

## 2019-02-25 ENCOUNTER — Telehealth: Payer: Self-pay | Admitting: Cardiology

## 2019-02-25 NOTE — Telephone Encounter (Signed)
Patient's wife is calling requesting samples of ELIQUIS 5 MG TABS tablet medication. She states that the patient only has a few tablets remaining. Please call.

## 2019-02-25 NOTE — Telephone Encounter (Signed)
Medication Samples left at front desk for pt along with PA app for Eliquis. DPR on file. Pt wife aware and states samples will be picked up on Thursday.  Drug name: ELIQUIS       Strength: 5 MG        Qty: 2 BOXES  LOT: [id number]  Exp.Date: 01/2021   [name] Evan [name] 4:21 PM 02/25/2019

## 2019-02-28 ENCOUNTER — Ambulatory Visit (INDEPENDENT_AMBULATORY_CARE_PROVIDER_SITE_OTHER): Payer: Medicare Other | Admitting: Cardiology

## 2019-02-28 ENCOUNTER — Other Ambulatory Visit: Payer: Self-pay

## 2019-02-28 ENCOUNTER — Encounter: Payer: Self-pay | Admitting: Cardiology

## 2019-02-28 VITALS — BP 115/63 | HR 64 | Temp 97.7°F | Ht 72.0 in | Wt 268.0 lb

## 2019-02-28 DIAGNOSIS — Z950 Presence of cardiac pacemaker: Secondary | ICD-10-CM | POA: Diagnosis not present

## 2019-02-28 DIAGNOSIS — Z953 Presence of xenogenic heart valve: Secondary | ICD-10-CM | POA: Diagnosis not present

## 2019-02-28 DIAGNOSIS — I25810 Atherosclerosis of coronary artery bypass graft(s) without angina pectoris: Secondary | ICD-10-CM

## 2019-02-28 DIAGNOSIS — I6522 Occlusion and stenosis of left carotid artery: Secondary | ICD-10-CM

## 2019-02-28 DIAGNOSIS — I10 Essential (primary) hypertension: Secondary | ICD-10-CM

## 2019-02-28 NOTE — Addendum Note (Signed)
Addended by: Kathyrn Lass on: 02/28/2019 10:49 AM   Modules accepted: Orders

## 2019-03-20 ENCOUNTER — Ambulatory Visit (INDEPENDENT_AMBULATORY_CARE_PROVIDER_SITE_OTHER): Payer: Medicare Other | Admitting: *Deleted

## 2019-03-20 DIAGNOSIS — R001 Bradycardia, unspecified: Secondary | ICD-10-CM

## 2019-03-20 LAB — CUP PACEART REMOTE DEVICE CHECK
Battery Remaining Longevity: 140 mo
Battery Remaining Percentage: 95.5 %
Battery Voltage: 3.01 V
Brady Statistic RV Percent Paced: 91 %
Date Time Interrogation Session: 20210224025932
Implantable Lead Implant Date: 20190228
Implantable Lead Location: 753860
Implantable Pulse Generator Implant Date: 20190228
Lead Channel Impedance Value: 450 Ohm
Lead Channel Pacing Threshold Amplitude: 0.625 V
Lead Channel Pacing Threshold Pulse Width: 0.5 ms
Lead Channel Sensing Intrinsic Amplitude: 10.3 mV
Lead Channel Setting Pacing Amplitude: 0.875
Lead Channel Setting Pacing Pulse Width: 0.5 ms
Lead Channel Setting Sensing Sensitivity: 2 mV
Pulse Gen Model: 1272
Pulse Gen Serial Number: 8971140

## 2019-03-21 ENCOUNTER — Telehealth: Payer: Self-pay

## 2019-03-21 NOTE — Telephone Encounter (Signed)
Called patient left message on personal voice mail Eliquis 5 mg samples left at [facility] office front desk.

## 2019-03-21 NOTE — Progress Notes (Signed)
PPM Remote  

## 2019-03-27 ENCOUNTER — Telehealth: Payer: Self-pay | Admitting: Cardiology

## 2019-03-27 NOTE — Telephone Encounter (Signed)
Evan [name] the patient's wife is calling requesting she attend the patients upcoming appointment scheduled for tomorrow 03/28/19 due to her going to every appointment with him before and being his transportation. Please advise.

## 2019-03-27 NOTE — Telephone Encounter (Signed)
I spoke with patient's wife. She usually attends appointments with patient. Patient has balance issues. I told patient's wife I would make a note that she would attend appointment with patient.

## 2019-03-28 ENCOUNTER — Other Ambulatory Visit: Payer: Self-pay

## 2019-03-28 ENCOUNTER — Ambulatory Visit (INDEPENDENT_AMBULATORY_CARE_PROVIDER_SITE_OTHER): Payer: Medicare Other | Admitting: Cardiology

## 2019-03-28 ENCOUNTER — Encounter: Payer: Self-pay | Admitting: Cardiology

## 2019-03-28 VITALS — BP 142/60 | HR 69 | Ht 72.0 in | Wt 269.6 lb

## 2019-03-28 DIAGNOSIS — I4821 Permanent atrial fibrillation: Secondary | ICD-10-CM

## 2019-03-28 DIAGNOSIS — I6522 Occlusion and stenosis of left carotid artery: Secondary | ICD-10-CM

## 2019-03-28 DIAGNOSIS — Z79899 Other long term (current) drug therapy: Secondary | ICD-10-CM | POA: Diagnosis not present

## 2019-03-28 LAB — CUP PACEART INCLINIC DEVICE CHECK
Date Time Interrogation Session: 20210304165836
Implantable Lead Implant Date: 20190228
Implantable Lead Location: 753860
Implantable Pulse Generator Implant Date: 20190228
Pulse Gen Model: 1272
Pulse Gen Serial Number: 8971140

## 2019-03-28 NOTE — Patient Instructions (Signed)
Medication Instructions:  Your physician recommends that you continue on your current medications as directed. Please refer to the Current Medication list given to you today.  *If you need a refill on your cardiac medications before your next appointment, please call your pharmacy*   Lab Work: Today: BMET & CBC If you have labs (blood work) drawn today and your tests are completely normal, you will receive your results only by: Marland Kitchen MyChart Message (if you have MyChart) OR . A paper copy in the mail If you have any lab test that is abnormal or we need to change your treatment, we will call you to review the results.   Testing/Procedures: None ordered   Follow-Up: Remote monitoring is used to monitor your Pacemaker of ICD from home. This monitoring reduces the number of office visits required to check your device to one time per year. It allows [place] to keep an eye on the functioning of your device to ensure it is working properly. You are scheduled for a device check from home on 06/19/2019. You may send your transmission at any time that day. If you have a wireless device, the transmission will be sent automatically. After your physician reviews your transmission, you will receive a postcard with your next transmission date.   At [facility], you and your health needs are our priority.  As part of our continuing mission to provide you with exceptional heart care, we have created designated Provider Care Teams.  These Care Teams include your primary Cardiologist (physician) and Advanced Practice Providers (APPs -  Physician Assistants and Nurse Practitioners) who all work together to provide you with the care you need, when you need it.  We recommend signing up for the patient portal called "MyChart".  Sign up information is provided on this After Visit Summary.  MyChart is used to connect with patients for Virtual Visits (Telemedicine).  Patients are able to view lab/test results, encounter  notes, upcoming appointments, etc.  Non-urgent messages can be sent to your provider as well.   To learn more about what you can do with MyChart, go to [email or web address].    Your next appointment:   1 year(s)  The format for your next appointment:   In Person  Provider:   Allegra Lai, MD   Thank you for choosing [facility]!!   Trinidad Curet, RN [phone]    Other Instructions

## 2019-03-28 NOTE — Progress Notes (Signed)
Electrophysiology Office Note   Date:  03/28/2019   ID:  Evan [name], DOB [birth year], MRN [id number]  PCP:  Leonel Ramsay, MD  Cardiologist:  [name] Primary Electrophysiologist:  Constance Haw, MD    No chief complaint on file.    History of Present Illness: Evan [name] is a 79 y.o. male who is being seen today for the evaluation of atrial fibrillation at the request of [name] [name]. Presenting today for electrophysiology evaluation.  Is a history of coronary artery disease and atrial fibrillation.  He is status post coronary artery bypass in 2005.  He had a cath in 2007 with patent grafts.  He underwent left carotid endarterectomy May 2014.  He has been intolerant of statins due to myalgias and weakness.  He has permanent atrial fibrillation.  His heart rate has been slow in the past and thus metoprolol has been stopped. He had a St. Jude pacemaker implanted 03/23/17.  Today, denies symptoms of palpitations, chest pain, shortness of breath, orthopnea, PND, lower extremity edema, claudication, dizziness, presyncope, syncope, bleeding, or neurologic sequela. The patient is tolerating medications without difficulties. He overall feels well. His main complaint is SOB which has been a chronic issue. His wife feels that this is due to his weight.     Past Medical History:  Diagnosis Date  . Anemia   . Carotid arterial disease ([place])   . CHF (congestive heart failure) ([place])   . Coronary artery disease   . HOH (hard of hearing)   . Hyperlipidemia   . Hypertension   . Nocturia   . Obesity   . Osteoarthritis    "BUE; BLE; right knee" (03/23/2017)  . Peripheral vision loss, bilateral    S/P 07/2015  . Permanent atrial fibrillation ([place])   . 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 Severe aortic stenosis   . Sinus drainage   . Stroke [facility]) 07/2015   "peripheral vision still bad out of left eye" (03/23/2017)  . Torn rotator cuff 2012   Right arm  . Type II  diabetes mellitus ([place])   . Urinary frequency    Past Surgical History:  Procedure Laterality Date  . BACK SURGERY    . CARDIAC CATHETERIZATION  11/29/2005   SEVERE THREE VESSEL OBSTRUCTIVE ATHERSCLEROTIC CAD. ALL GRAFTS WERE PATENT. EF 65%  . CARDIAC CATHETERIZATION  03/23/2017  . CAROTID ENDARTERECTOMY Right 01-29-08   cea  . CARPAL TUNNEL RELEASE Right   . CORONARY ARTERY BYPASS GRAFT  03/2003   X3. LIMA GRAFT TO THE LAD, SAPHENOUS VEIN GRAFT TO THE PA, AND A LEFT RADIAL GRAFT TO THEOBTUSE MARGINAL VESSEL  . ENDARTERECTOMY Left 05/28/2012   Procedure: ENDARTERECTOMY CAROTID;  Surgeon: Elam Dutch, MD;  Location: [facility];  Service: Vascular;  Laterality: Left;  . INSERT / REPLACE / REMOVE PACEMAKER  03/23/2017  . [place]; 1984  . MOLE SURGERY     "? face/arms"  . PACEMAKER IMPLANT N/A 03/23/2017   Procedure: PACEMAKER IMPLANT;  Surgeon: Constance Haw, MD;  Location: [facility];  Service: Cardiovascular;  Laterality: N/A;  . PATCH ANGIOPLASTY Left 05/28/2012   Procedure: WITH dACRON PATCH ANGIOPLASTY;  Surgeon: Elam Dutch, MD;  Location: [facility];  Service: Vascular;  Laterality: Left;  . RIGHT/LEFT HEART CATH AND CORONARY/GRAFT ANGIOGRAPHY N/A 03/23/2017   Procedure: RIGHT/LEFT HEART CATH AND CORONARY/GRAFT ANGIOGRAPHY;  Surgeon: Sherren Mocha, MD;  Location: [facility];  Service: Cardiovascular;  Laterality: N/A;  . TEE WITHOUT CARDIOVERSION N/A 05/09/2017   Procedure: TRANSESOPHAGEAL ECHOCARDIOGRAM (TEE);  Surgeon: Sherren Mocha, MD;  Location: [facility];  Service: Open Heart Surgery;  Laterality: N/A;  . TRANSCATHETER AORTIC VALVE REPLACEMENT, TRANSFEMORAL N/A 05/09/2017   Procedure: TRANSCATHETER AORTIC VALVE REPLACEMENT, TRANSFEMORAL using an Edwards 49mm Sapien 3 Aortic Valve;  Surgeon: Sherren Mocha, MD;  Location: [facility];  Service: Open Heart Surgery;  Laterality: N/A;     Current Outpatient Medications  Medication Sig Dispense Refill    . acetaminophen (TYLENOL) 650 MG CR tablet Take 1,300 mg by mouth every 8 (eight) hours as needed for pain.     Marland Kitchen amLODipine (NORVASC) 5 MG tablet Take 1 tablet (5 mg total) by mouth daily. 90 tablet 3  . benazepril (LOTENSIN) 40 MG tablet TAKE 1 TABLET BY MOUTH EVERY DAY 90 tablet 3  . Camphor-Menthol-Methyl Sal (MUSCLE RUB ULTRA STRENGTH) 05-03-28 % CREA Apply 1 application topically 3 (three) times daily as needed (muscle pain).    . Cyanocobalamin (RA VITAMIN B-12 TR) 1000 MCG TBCR Take 1,000 mcg by mouth daily.     . cyanocobalamin 1000 MCG tablet Take by mouth.    [name] 5 MG TABS tablet TAKE 1 TABLET BY MOUTH EVERY 12 HOURS 180 tablet 1  . ezetimibe (ZETIA) 10 MG tablet TAKE 1 TABLET BY MOUTH EVERY DAY 90 tablet 2  . fluticasone (FLONASE) 50 MCG/ACT nasal spray Place 2 sprays into both nostrils daily.    . furosemide (LASIX) 20 MG tablet TAKE 1 TABLET BY MOUTH EVERY DAY 90 tablet 3  . furosemide (LASIX) 20 MG tablet Take by mouth.    Marland Kitchen glimepiride (AMARYL) 2 MG tablet TAKE 1 TABLET (2 MG TOTAL) BY MOUTH DAILY WITH BREAKFAST 90 tablet 0  . guaiFENesin-codeine 100-10 MG/5ML syrup Take 5 mLs by mouth every 6 (six) hours as needed for cough.     . hydrALAZINE (APRESOLINE) 25 MG tablet Take 25 mg by mouth 2 (two) times daily.     . hydrochlorothiazide (HYDRODIURIL) 25 MG tablet TAKE 1 TABLET BY MOUTH EVERY DAY IN THE MORNING 90 tablet 0  . OVER THE COUNTER MEDICATION Replenex tablets: Take 3 tablet by mouth every morning (joint health)    . Polyvinyl Alcohol-Povidone (REFRESH OP) Place 2 drops into both eyes daily as needed (for dry eyes).     . tamsulosin (FLOMAX) 0.4 MG CAPS capsule TAKE 1 CAPSULE BY MOUTH ONCE DAILY (Patient taking differently: TAKE 0.4 MG BY MOUTH ONCE DAILY IN THE EVENING) 90 capsule 3   No current facility-administered medications for this visit.    Allergies:   Niacin, Niacin and related, Metformin and related, and Statins   Social History:  The patient  reports  that he quit smoking about 25 years ago. His smoking use included cigarettes. He has a 78.00 pack-year smoking history. He has never used smokeless tobacco. He reports that he does not drink alcohol or use drugs.   Family History:  The patient's family history includes Arrhythmia in his brother; Cancer in his brother; Heart attack in his brother and father; Parkinsonism in his brother.    ROS:  Please see the history of present illness.   Otherwise, review of systems is positive for none.   All other systems are reviewed and negative.   PHYSICAL EXAM: VS:  BP (!) 142/60   Pulse 69   Ht 6' (1.829 m)   Wt 269 lb 9.6 oz (122.3 kg)   SpO2 93%  BMI 36.56 kg/m  , BMI Body mass index is 36.56 kg/m. GEN: Well nourished, well developed, in no acute distress  HEENT: normal  Neck: no JVD, carotid bruits, or masses Cardiac: RRR; no murmurs, rubs, or gallops,no edema  Respiratory:  clear to auscultation bilaterally, normal work of breathing GI: soft, nontender, nondistended, + BS MS: no deformity or atrophy  Skin: warm and dry, device site well healed Neuro:  Strength and sensation are intact Psych: euthymic mood, full affect  EKG:  EKG is not ordered today. Personal review of the ekg ordered 03/01/19 shows atrial fibrillation, ventricular paced  Personal review of the device interrogation today. Results in [facility]: 08/06/2018: ALT 21; BUN 22; Creatinine, Ser 1.42; Potassium 4.9; Sodium 140    Lipid Panel     Component Value Date/Time   CHOL 152 08/06/2018 1201   TRIG 107 08/06/2018 1201   HDL 42 08/06/2018 1201   CHOLHDL 3.6 08/06/2018 1201   CHOLHDL 2.3 12/11/2015 0854   VLDL 12 12/11/2015 0854   LDLCALC 89 08/06/2018 1201     Wt Readings from Last 3 Encounters:  03/28/19 269 lb 9.6 oz (122.3 kg)  02/28/19 268 lb (121.6 kg)  08/06/18 267 lb (121.1 kg)      Other studies Reviewed: Additional studies/ records that were reviewed today include: TTE  08/27/2018 Review of the above records today demonstrates:  1. The left ventricle has normal systolic function, with an ejection  fraction of 55-60%. The cavity size was normal. There is moderately  increased left ventricular wall thickness. Left ventricular diastolic  function could not be evaluated secondary to  atrial fibrillation. Elevated mean left atrial pressure.  2. The right ventricle has normal systolic function. The cavity was  moderately enlarged. Right ventricular systolic pressure is mildly  elevated with an estimated pressure of 33.2 mmHg.  3. Left atrial size was severely dilated.  4. Right atrial size was severely dilated.  5. The mitral valve is abnormal. There is moderate mitral annular  calcification present.  6. The tricuspid valve is grossly normal.  7. A Edwards Sapien bioprosthetic aortic valve (TAVR) valve is present in  the aortic position. Procedure Date: 04/29/2017 Normal aortic valve  prosthesis.  8. The aorta is normal in size and structure.  9. The inferior vena cava was dilated in size with >50% respiratory  variability.  10. Normal LV systolic function; moderate LVH; biatrial enlargement;  moderate RVE; s/p AVR with mean gradient of 17 mmHg and no AI.   Holter 02/20/17 - personally reviewed  Atrial fibrillation with slow ventricular response  Frequent pauses- longest 5.08 seconds  Occasional PVCs  ASSESSMENT AND PLAN:  1.  Atrial fibrillation, permanent: Currently on Eliquis.  Status post Saint Jude dual-chamber pacemaker 03/23/2017 due to significant bradycardia.  CHA2DS2-VASc of 6.  Device functioning appropriately.  No changes.   2.  Coronary artery disease: CABG in 2015.  No current chest pain.  3.  Moderate to severe aortic stenosis: Status post TAVR 05/09/2017.  Plan per primary cardiology.  .  4.  Hypertension: elevated today but usually well controlled. No changes.  5.  CVA with hemorrhagic transformation: Continue Eliquis  6.   Shortness of breath: likely due to AF. Patient is permanent. No changes.  Current medicines are reviewed at length with the patient today.   The patient does not have concerns regarding his medicines.  The following changes were made today: none  Labs/ tests ordered today include:  No  orders of the defined types were placed in this encounter.   Disposition:   FU with Ausencio Vaden 12 months  Signed, Banesa Tristan Meredith Leeds, MD  03/28/2019 1:55 PM     [address] [place] [address] [phone] (office) [phone] (fax)

## 2019-03-29 ENCOUNTER — Telehealth: Payer: Self-pay

## 2019-03-29 DIAGNOSIS — Z79899 Other long term (current) drug therapy: Secondary | ICD-10-CM

## 2019-03-29 LAB — BASIC METABOLIC PANEL
BUN/Creatinine Ratio: 20 (ref 10–24)
BUN: 30 mg/dL — ABNORMAL HIGH (ref 8–27)
CO2: 22 mmol/L (ref 20–29)
Calcium: 9 mg/dL (ref 8.6–10.2)
Chloride: 105 mmol/L (ref 96–106)
Creatinine, Ser: 1.49 mg/dL — ABNORMAL HIGH (ref 0.76–1.27)
GFR calc Af Amer: 52 mL/min/{1.73_m2} — ABNORMAL LOW (ref >59)
GFR calc non Af Amer: 45 mL/min/{1.73_m2} — ABNORMAL LOW (ref >59)
Glucose: 183 mg/dL — ABNORMAL HIGH (ref 65–99)
Potassium: 5.3 mmol/L — ABNORMAL HIGH (ref 3.5–5.2)
Sodium: 143 mmol/L (ref 134–144)

## 2019-03-29 LAB — CBC
Hematocrit: 30.1 % — ABNORMAL LOW (ref 37.5–51.0)
Hemoglobin: 9.8 g/dL — ABNORMAL LOW (ref 13.0–17.7)
MCH: 32.3 pg (ref 26.6–33.0)
MCHC: 32.6 g/dL (ref 31.5–35.7)
MCV: 99 fL — ABNORMAL HIGH (ref 79–97)
Platelets: 320 10*3/uL (ref 150–450)
RBC: 3.03 x10E6/uL — ABNORMAL LOW (ref 4.14–5.80)
RDW: 14.6 % (ref 11.6–15.4)
WBC: 5.3 10*3/uL (ref 3.4–10.8)

## 2019-03-29 NOTE — Telephone Encounter (Signed)
-----   Message from Will Meredith Leeds, MD sent at 03/29/2019 10:26 AM EST ----- K elevated. Cut down on K rich foods and repeat BMP in 2 weeks.

## 2019-03-29 NOTE — Telephone Encounter (Signed)
The patient has been notified of the result and verbalized understanding.  All questions (if any) were answered.  Pt getting lab work done at PCP next week in [place] and requesting to get labs done there rather than making an additional appt for f/u BMP.   Will place order for BMP and route note to Dr. Ola Spurr.   Wilma Flavin, RN 03/29/2019 1:54 PM

## 2019-04-15 ENCOUNTER — Other Ambulatory Visit: Payer: Self-pay

## 2019-04-15 ENCOUNTER — Encounter: Payer: Self-pay | Admitting: Internal Medicine

## 2019-04-16 ENCOUNTER — Inpatient Hospital Stay: Payer: Medicare Other

## 2019-04-16 ENCOUNTER — Encounter: Payer: Self-pay | Admitting: Internal Medicine

## 2019-04-16 ENCOUNTER — Inpatient Hospital Stay: Payer: Medicare Other | Attending: Internal Medicine | Admitting: Internal Medicine

## 2019-04-16 DIAGNOSIS — I110 Hypertensive heart disease with heart failure: Secondary | ICD-10-CM | POA: Insufficient documentation

## 2019-04-16 DIAGNOSIS — Z8673 Personal history of transient ischemic attack (TIA), and cerebral infarction without residual deficits: Secondary | ICD-10-CM | POA: Diagnosis not present

## 2019-04-16 DIAGNOSIS — Z950 Presence of cardiac pacemaker: Secondary | ICD-10-CM | POA: Insufficient documentation

## 2019-04-16 DIAGNOSIS — R0602 Shortness of breath: Secondary | ICD-10-CM | POA: Diagnosis not present

## 2019-04-16 DIAGNOSIS — R5383 Other fatigue: Secondary | ICD-10-CM

## 2019-04-16 DIAGNOSIS — Z87891 Personal history of nicotine dependence: Secondary | ICD-10-CM | POA: Diagnosis not present

## 2019-04-16 DIAGNOSIS — Z7984 Long term (current) use of oral hypoglycemic drugs: Secondary | ICD-10-CM | POA: Insufficient documentation

## 2019-04-16 DIAGNOSIS — E119 Type 2 diabetes mellitus without complications: Secondary | ICD-10-CM | POA: Diagnosis not present

## 2019-04-16 DIAGNOSIS — Z951 Presence of aortocoronary bypass graft: Secondary | ICD-10-CM | POA: Insufficient documentation

## 2019-04-16 DIAGNOSIS — E785 Hyperlipidemia, unspecified: Secondary | ICD-10-CM | POA: Insufficient documentation

## 2019-04-16 DIAGNOSIS — I509 Heart failure, unspecified: Secondary | ICD-10-CM | POA: Diagnosis not present

## 2019-04-16 DIAGNOSIS — Z7901 Long term (current) use of anticoagulants: Secondary | ICD-10-CM | POA: Insufficient documentation

## 2019-04-16 DIAGNOSIS — Z8249 Family history of ischemic heart disease and other diseases of the circulatory system: Secondary | ICD-10-CM | POA: Diagnosis not present

## 2019-04-16 DIAGNOSIS — Z952 Presence of prosthetic heart valve: Secondary | ICD-10-CM | POA: Insufficient documentation

## 2019-04-16 DIAGNOSIS — D539 Nutritional anemia, unspecified: Secondary | ICD-10-CM

## 2019-04-16 DIAGNOSIS — Z79899 Other long term (current) drug therapy: Secondary | ICD-10-CM | POA: Diagnosis not present

## 2019-04-16 DIAGNOSIS — I4821 Permanent atrial fibrillation: Secondary | ICD-10-CM | POA: Diagnosis not present

## 2019-04-16 DIAGNOSIS — I2510 Atherosclerotic heart disease of native coronary artery without angina pectoris: Secondary | ICD-10-CM | POA: Diagnosis not present

## 2019-04-16 LAB — VITAMIN B12: Vitamin B-12: 977 pg/mL — ABNORMAL HIGH (ref 180–914)

## 2019-04-16 LAB — FOLATE: Folate: 10.8 ng/mL (ref >5.9)

## 2019-04-16 LAB — HEPATITIS C ANTIBODY: HCV Ab: NONREACTIVE

## 2019-04-16 LAB — HEPATITIS B CORE ANTIBODY, IGM: Hep B C IgM: NONREACTIVE

## 2019-04-16 LAB — CBC WITH DIFFERENTIAL/PLATELET
Abs Immature Granulocytes: 0.35 10*3/uL — ABNORMAL HIGH (ref 0.00–0.07)
Basophils Absolute: 0.1 10*3/uL (ref 0.0–0.1)
Basophils Relative: 1 %
Eosinophils Absolute: 0.2 10*3/uL (ref 0.0–0.5)
Eosinophils Relative: 5 %
HCT: 29.5 % — ABNORMAL LOW (ref 39.0–52.0)
Hemoglobin: 9.5 g/dL — ABNORMAL LOW (ref 13.0–17.0)
Immature Granulocytes: 7 %
Lymphocytes Relative: 17 %
Lymphs Abs: 0.9 10*3/uL (ref 0.7–4.0)
MCH: 32.8 pg (ref 26.0–34.0)
MCHC: 32.2 g/dL (ref 30.0–36.0)
MCV: 101.7 fL — ABNORMAL HIGH (ref 80.0–100.0)
Monocytes Absolute: 0.3 10*3/uL (ref 0.1–1.0)
Monocytes Relative: 6 %
Neutro Abs: 3.3 10*3/uL (ref 1.7–7.7)
Neutrophils Relative %: 64 %
Platelets: 309 10*3/uL (ref 150–400)
RBC: 2.9 MIL/uL — ABNORMAL LOW (ref 4.22–5.81)
RDW: 15.7 % — ABNORMAL HIGH (ref 11.5–15.5)
WBC: 5.1 10*3/uL (ref 4.0–10.5)
nRBC: 0 % (ref 0.0–0.2)

## 2019-04-16 LAB — RETICULOCYTES
Immature Retic Fract: 18.2 % — ABNORMAL HIGH (ref 2.3–15.9)
RBC.: 2.92 MIL/uL — ABNORMAL LOW (ref 4.22–5.81)
Retic Count, Absolute: 72.7 10*3/uL (ref 19.0–186.0)
Retic Ct Pct: 2.5 % (ref 0.4–3.1)

## 2019-04-16 LAB — IRON AND TIBC
Iron: 116 ug/dL (ref 45–182)
Saturation Ratios: 31 % (ref 17.9–39.5)
TIBC: 370 ug/dL (ref 250–450)
UIBC: 254 ug/dL

## 2019-04-16 LAB — FERRITIN: Ferritin: 44 ng/mL (ref 24–336)

## 2019-04-16 LAB — LACTATE DEHYDROGENASE: LDH: 169 U/L (ref 98–192)

## 2019-04-16 LAB — TECHNOLOGIST SMEAR REVIEW: Plt Morphology: ADEQUATE

## 2019-04-16 LAB — HEPATITIS B SURFACE ANTIGEN: Hepatitis B Surface Ag: NONREACTIVE

## 2019-04-16 NOTE — Assessment & Plan Note (Addendum)
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  The etiology is unclear-possibilities include liver disease [see below]; nutritional deficiency; primary bone marrow problems like MDS.   Discussed with the patient the bone marrow biopsy and aspiration indication and procedure at length.  Given significant discomfort involved-I would recommend under anesthesia/with radiology in the hospital. I discussed the potential complications include-bleeding/trauma and risk of infection; which are fortunately very rare.  Patient wants to wait on the bone marrow biopsy at this time; will reconsider at next visit if the hemoglobin not improving/getting worse.   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  Check viral serologies.  Patient no history of alcoholism.   #CAD/A. Fib-on Eliquis.  S/p PPM  Thank you Dr. Ola Spurr for allowing me to participate in the care of your pleasant patient. Please do not hesitate to contact me with questions or concerns in the interim.  Above plan of care was discussed with patient his wife in detail.  They agree.  #DISPOSITION: # labs- today [ordered]-CBC; LDH myeloma panel haptoglobin; reticulocyte count review of smear I16 folic acid iron studies ferritin; zinc copper.  Erythropoietin levels.  # follow up MD-in 2 months- labs- cbc/bmp-Dr.B

## 2019-04-16 NOTE — Progress Notes (Signed)
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  male has been referred to [place] for further evaluation/work-up for anemia.  Patient states that he has multiple cardiac problems-including A. Fib; on Eliquis.  Is also status post PPM.   Patient noted to have worsening shortness of breath for exertion for the last few years.  Progressively getting worse.  Denies any worsening swelling in the legs.   Patient denies any blood in stools black or stools.  Denies any difficulty swallowing.  No weight loss.  Patient has been on B12 supplementation for the last 3 years.  Patient never had a colonoscopy.   Review of Systems  Constitutional: Positive for malaise/fatigue. Negative for chills, diaphoresis, fever and weight loss.  HENT: Negative for nosebleeds and sore throat.   Eyes: Negative for double vision.  Respiratory: Positive for shortness of breath. Negative for cough, hemoptysis, sputum production and wheezing.   Cardiovascular: Negative for chest pain, palpitations, orthopnea and leg swelling.  Gastrointestinal: Negative for abdominal pain, blood in stool, constipation, diarrhea, heartburn, melena, nausea and vomiting.  Genitourinary: Positive for frequency. Negative for dysuria and urgency.  Musculoskeletal: Negative for back pain and joint pain.  Skin: Negative.  Negative for itching and rash.  Neurological: Negative for dizziness, tingling, focal weakness, weakness and headaches.  Endo/Heme/Allergies: Does not bruise/bleed easily.   Psychiatric/Behavioral: Negative for depression. The patient is not nervous/anxious and does not have insomnia.     MEDICAL HISTORY:  Past Medical History:  Diagnosis Date  . Anemia   . Carotid arterial disease ([place])   . CHF (congestive heart failure) ([place])   . Coronary artery disease   . Hearing loss   . HOH (hard of hearing)   . Hyperlipidemia   . Hypertension   . Nocturia   . Obesity   . Osteoarthritis    "BUE; BLE; right knee" (03/23/2017)  . Peripheral vision loss, bilateral    S/P 07/2015  . Permanent atrial fibrillation ([place])   . 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 Severe aortic stenosis   . Sinus drainage   . Stroke [facility]) 07/2015   "peripheral vision still bad out of left eye" (03/23/2017)  . Torn rotator cuff 2012   Right arm  . Type II diabetes mellitus ([place])   . Urinary frequency     SURGICAL HISTORY: Past Surgical History:  Procedure Laterality Date  . BACK SURGERY    . CARDIAC CATHETERIZATION  11/29/2005   SEVERE THREE VESSEL OBSTRUCTIVE ATHERSCLEROTIC CAD. ALL GRAFTS WERE PATENT. EF 65%  . CARDIAC CATHETERIZATION  03/23/2017  . CAROTID ENDARTERECTOMY Right 01-29-08   cea  . CARPAL TUNNEL RELEASE Right   . CORONARY ARTERY BYPASS GRAFT  03/2003   X3. LIMA GRAFT TO THE LAD, SAPHENOUS VEIN GRAFT TO THE PA, AND A LEFT RADIAL GRAFT TO THEOBTUSE MARGINAL VESSEL  . ENDARTERECTOMY Left 05/28/2012   Procedure: ENDARTERECTOMY CAROTID;  Surgeon: Elam Dutch, MD;  Location: [place];  Service: Vascular;  Laterality: Left;  . INSERT / REPLACE / REMOVE PACEMAKER  03/23/2017  .  [place]; 1984  . MOLE SURGERY     "? face/arms"  . PACEMAKER IMPLANT N/A 03/23/2017   Procedure: PACEMAKER IMPLANT;  Surgeon: Constance Haw, MD;  Location: [place] CV LAB;  Service: Cardiovascular;  Laterality: N/A;  . PATCH ANGIOPLASTY Left 05/28/2012   Procedure: WITH dACRON PATCH ANGIOPLASTY;  Surgeon: Elam Dutch, MD;  Location: [place];   Service: Vascular;  Laterality: Left;  . RIGHT/LEFT HEART CATH AND CORONARY/GRAFT ANGIOGRAPHY N/A 03/23/2017   Procedure: RIGHT/LEFT HEART CATH AND CORONARY/GRAFT ANGIOGRAPHY;  Surgeon: Sherren Mocha, MD;  Location: [place] CV LAB;  Service: Cardiovascular;  Laterality: N/A;  . TEE WITHOUT CARDIOVERSION N/A 05/09/2017   Procedure: TRANSESOPHAGEAL ECHOCARDIOGRAM (TEE);  Surgeon: Sherren Mocha, MD;  Location: [place];  Service: Open Heart Surgery;  Laterality: N/A;  . TRANSCATHETER AORTIC VALVE REPLACEMENT, TRANSFEMORAL N/A 05/09/2017   Procedure: TRANSCATHETER AORTIC VALVE REPLACEMENT, TRANSFEMORAL using an Edwards 17m Sapien 3 Aortic Valve;  Surgeon: CSherren Mocha MD;  [place]  Service: Open Heart Surgery;  Laterality: N/A;    SOCIAL HISTORY: Social History   Socioeconomic History  . Marital status: Married    Spouse name: Not on file  . Number of children: 5  . Years of education: Not on file  . Highest education level: Not on file  Occupational History  . Occupation: retired    EFish farm manager RETIRED  Tobacco Use  . Smoking status: Former Smoker    Packs/day: 2.00    Years: 39.00    Pack years: 78.00    Types: Cigarettes    Quit date: 01/24/1994    Years since quitting: 25.2  . Smokeless tobacco: Never Used  Substance and Sexual Activity  . Alcohol use: No  . Drug use: No  . Sexual activity: Never  Other Topics Concern  . 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 Difficulty of Paying Living Expenses:   Food Insecurity:   . Worried About RCharity fundraiserin the Last Year:   . RArboriculturistin the Last Year:   Transportation Needs:   . LFilm/video editor(Medical):   .Marland KitchenLack of Transportation (Non-Medical):   Physical Activity:   . Days of Exercise per Week:   . Minutes of Exercise per Session:    Stress:   . Feeling of Stress :   Social Connections:   . Frequency of Communication with Friends and Family:   . Frequency of Social Gatherings with Friends and Family:   . Attends Religious Services:   . Active Member of Clubs or Organizations:   . Attends CArchivistMeetings:   .Marland KitchenMarital Status:   Intimate Partner Violence:   . Fear of Current or Ex-Partner:   . Emotionally Abused:   .Marland KitchenPhysically Abused:   . Sexually Abused:     FAMILY HISTORY: Family History  Problem Relation Age of Onset  . Heart attack Father   . Heart attack Brother   . Parkinsonism Brother   . Cancer Brother   . Arrhythmia Brother     ALLERGIES:  is allergic to niacin; niacin and related; metformin and related; and statins.  MEDICATIONS:  Current Outpatient Medications  Medication Sig Dispense Refill  . acetaminophen (TYLENOL) 650 MG CR tablet Take 1,300 mg by mouth every  8 (eight) hours as needed for pain.     Marland Kitchen amLODipine (NORVASC) 5 MG tablet Take 1 tablet (5 mg total) by mouth daily. 90 tablet 3  . benazepril (LOTENSIN) 40 MG tablet TAKE 1 TABLET BY MOUTH EVERY DAY 90 tablet 3  . Camphor-Menthol-Methyl Sal (MUSCLE RUB ULTRA STRENGTH) 05-03-28 % CREA Apply 1 application topically 3 (three) times daily as needed (muscle pain).    . Cyanocobalamin (RA VITAMIN B-12 TR) 1000 MCG TBCR Take 1,000 mcg by mouth daily.     . cyanocobalamin 1000 MCG tablet Take by mouth.    [name] 5 MG TABS tablet TAKE 1 TABLET BY MOUTH EVERY 12 HOURS 180 tablet 1  . ezetimibe (ZETIA) 10 MG tablet TAKE 1 TABLET BY MOUTH EVERY DAY 90 tablet 2  . fluticasone (FLONASE) 50 MCG/ACT nasal spray Place 2 sprays into both nostrils daily.    . furosemide (LASIX) 20 MG tablet TAKE 1 TABLET BY MOUTH EVERY DAY 90 tablet 3  . glimepiride (AMARYL) 2 MG tablet TAKE 1 TABLET (2 MG TOTAL) BY MOUTH DAILY WITH BREAKFAST 90 tablet 0  . guaiFENesin-codeine 100-10 MG/5ML syrup Take 5 mLs by mouth every 6 (six) hours as needed for  cough.     . hydrALAZINE (APRESOLINE) 25 MG tablet Take 25 mg by mouth 2 (two) times daily.     . hydrochlorothiazide (HYDRODIURIL) 25 MG tablet TAKE 1 TABLET BY MOUTH EVERY DAY IN THE MORNING 90 tablet 0  . OVER THE COUNTER MEDICATION Replenex tablets: Take 3 tablet by mouth every morning (joint health)    . Polyvinyl Alcohol-Povidone (REFRESH OP) Place 2 drops into both eyes daily as needed (for dry eyes).     . tamsulosin (FLOMAX) 0.4 MG CAPS capsule TAKE 1 CAPSULE BY MOUTH ONCE DAILY (Patient taking differently: TAKE 0.4 MG BY MOUTH ONCE DAILY IN THE EVENING) 90 capsule 3   No current facility-administered medications for this visit.      PHYSICAL EXAMINATION:   Vitals:   04/16/19 1100  BP: 127/63  Pulse: 86  Temp: (!) 97.3 F (36.3 C)   Filed Weights   04/16/19 1100  Weight: 272 lb (123.4 kg)    Physical Exam  Constitutional: He is oriented to person, place, and time and well-developed, well-nourished, and in no distress.  Obese male patient in a wheelchair.  Accompanied by his wife.  HENT:  Head: Normocephalic and atraumatic.  Mouth/Throat: Oropharynx is clear and moist. No oropharyngeal exudate.  Eyes: Pupils are equal, round, and reactive to light.  Cardiovascular: Normal rate and regular rhythm.  Pulmonary/Chest: No respiratory distress. He has no wheezes.  Clear to auscultation bilaterally.  No wheeze or crackles  Abdominal: Soft. Bowel sounds are normal. He exhibits no distension and no mass. There is no abdominal tenderness. There is no rebound and no guarding.  Musculoskeletal:        General: No tenderness or edema. Normal range of motion.     Cervical back: Normal range of motion and neck supple.  Neurological: He is alert and oriented to person, place, and time.  Skin: Skin is warm.  Psychiatric: Affect normal.    LABORATORY DATA:  I have reviewed the data as listed Lab Results  Component Value Date   WBC 5.3 03/28/2019   HGB 9.8 (L) 03/28/2019    HCT 30.1 (L) 03/28/2019   MCV 99 (H) 03/28/2019   PLT 320 03/28/2019   Recent Labs    08/06/18 1156 08/06/18 1159 03/28/19 1416  NA 140  --  143  K 4.9  --  5.3*  CL 101  --  105  CO2 21  --  22  GLUCOSE 136*  --  183*  BUN 22  --  30*  CREATININE 1.42*  --  1.49*  CALCIUM 9.5  --  9.0  GFRNONAA 47*  --  45*  GFRAA 55*  --  52*  PROT  --  7.0  --   ALBUMIN  --  4.6  --   AST  --  19  --   ALT  --  21  --   ALKPHOS  --  79  --   BILITOT  --  0.7  --   BILIDIR  --  0.20  --      CUP PACEART INCLINIC DEVICE CHECK  Result Date: 03/28/2019 Device checked in clinic by industry. See scanned report. Next home remote 06/19/19. ROV w/ WC in 1 year.Lavenia Atlas, BSN, RN  CUP PACEART REMOTE DEVICE CHECK  Result Date: 03/20/2019 Scheduled remote reviewed.  Normal device function.  Next remote 91 days.    [place]   Macrocytic anemia # Macrocytic anemia-hemoglobin 9.7 MCV 103; anemia slowly getting worse over the last 4 to 5 years.  The etiology is unclear-possibilities include liver disease [see below]; nutritional deficiency; primary bone marrow problems like MDS.   Discussed with the patient the bone marrow biopsy and aspiration indication and procedure at length.  Given significant discomfort involved-I would recommend under anesthesia/with radiology in the hospital. I discussed the potential complications include-bleeding/trauma and risk of infection; which are fortunately very rare.  Patient wants to wait on the bone marrow biopsy at this time; will reconsider at next visit if the hemoglobin not improving/getting worse.   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  Check viral serologies.  Patient no history of alcoholism.   #CAD/A. Fib-on Eliquis.  S/p PPM  Thank you Dr. Ola Spurr for allowing me to participate in the care of your pleasant patient. Please do not hesitate to contact me with questions or concerns in the interim.  Above plan of  care was discussed with patient his wife in detail.  They agree.  #DISPOSITION: # labs- today [ordered]-CBC; LDH myeloma panel haptoglobin; reticulocyte count review of smear B31 folic acid iron studies ferritin; zinc copper.  Erythropoietin levels.  # follow up MD-in 2 months- labs- cbc/bmp-Dr.B  All questions were answered. The patient knows to call the clinic with any problems, questions or concerns.    Cammie Sickle, MD 04/16/2019 12:22 PM

## 2019-04-17 LAB — HAPTOGLOBIN: Haptoglobin: 129 mg/dL (ref 34–355)

## 2019-04-17 LAB — KAPPA/LAMBDA LIGHT CHAINS
Kappa free light chain: 35.6 mg/L — ABNORMAL HIGH (ref 3.3–19.4)
Kappa, lambda light chain ratio: 1.24 (ref 0.26–1.65)
Lambda free light chains: 28.6 mg/L — ABNORMAL HIGH (ref 5.7–26.3)

## 2019-04-17 LAB — ERYTHROPOIETIN: Erythropoietin: 327.1 m[IU]/mL — ABNORMAL HIGH (ref 2.6–18.5)

## 2019-04-18 LAB — COPPER, SERUM: Copper: 118 ug/dL (ref 69–132)

## 2019-04-18 LAB — MULTIPLE MYELOMA PANEL, SERUM
Albumin SerPl Elph-Mcnc: 4.1 g/dL (ref 2.9–4.4)
Albumin/Glob SerPl: 1.5 (ref 0.7–1.7)
Alpha 1: 0.2 g/dL (ref 0.0–0.4)
Alpha2 Glob SerPl Elph-Mcnc: 0.7 g/dL (ref 0.4–1.0)
B-Globulin SerPl Elph-Mcnc: 1 g/dL (ref 0.7–1.3)
Gamma Glob SerPl Elph-Mcnc: 0.9 g/dL (ref 0.4–1.8)
Globulin, Total: 2.8 g/dL (ref 2.2–3.9)
IgA: 182 mg/dL (ref 61–437)
IgG (Immunoglobin G), Serum: 884 mg/dL (ref 603–1613)
IgM (Immunoglobulin M), Srm: 49 mg/dL (ref 15–143)
Total Protein ELP: 6.9 g/dL (ref 6.0–8.5)

## 2019-04-18 LAB — ZINC: Zinc: 77 ug/dL (ref 44–115)

## 2019-04-24 ENCOUNTER — Telehealth: Payer: Self-pay | Admitting: *Deleted

## 2019-04-24 NOTE — Telephone Encounter (Signed)
Wife called asking for lab results from last week. Please return her call. His next appointment is not until 06/18/19  CBC with Differential/Platelet Order: [id number]  Status: Final result  Visible to patient: No (inaccessible in MyChart)  Next appt: 06/18/2019 at 10:15 AM in Oncology ([facility])  Dx: Macrocytic anemia    Ref Range & Units 8 d ago  WBC 4.0 - 10.5 K/uL 5.1   RBC 4.22 - 5.81 MIL/uL 2.90  Hemoglobin 13.0 - 17.0 g/dL 9.5  HCT 39.0 - 52.0 % 29.5  MCV 80.0 - 100.0 fL 101.7  MCH 26.0 - 34.0 pg 32.8   MCHC 30.0 - 36.0 g/dL 32.2   RDW 11.5 - 15.5 % 15.7  Platelets 150 - 400 K/uL 309   nRBC 0.0 - 0.2 % 0.0   Neutrophils Relative % % 64   Neutro Abs 1.7 - 7.7 K/uL 3.3   Lymphocytes Relative % 17   Lymphs Abs 0.7 - 4.0 K/uL 0.9   Monocytes Relative % 6   Monocytes Absolute 0.1 - 1.0 K/uL 0.3   Eosinophils Relative % 5   Eosinophils Absolute 0.0 - 0.5 K/uL 0.2   Basophils Relative % 1   Basophils Absolute 0.0 - 0.1 K/uL 0.1   Immature Granulocytes % 7   Abs Immature Granulocytes 0.00 - 0.07 K/uL 0.35  Comment: Performed at [facility], [address] [place], [place] [address]  Resulting Agency  [facility] CLIN LAB     Specimen Collected: 04/16/19 11:52   Last Resulted: 04/16/19 12:26   Lab Flowsheet  Order Details  View Encounter  Lab and Collection Details  Routing  Result History      Other Results from 04/16/2019 Technologist smear review  Status: Final result  Visible to patient: No (inaccessible in [place])  Next appt: 06/18/2019 at 10:15 AM in Oncology ([facility])  Dx: Macrocytic anemia  Order: [id number]  Component 8 d ago  WBC Morphology MORPHOLOGY UNREMARKABLE   RBC Morphology MORPHOLOGY UNREMARKABLE   Tech Review PLATELETS APPEAR ADEQUATE   Comment: Normal platelet morphology  Performed at [facility], [address] [place], [place] [address]    Resulting Agency [facility] CLIN LAB     Specimen Collected: 04/16/19 12:01   Last Resulted:  04/16/19 13:36   Lab Flowsheet  Order Details  View Encounter  Lab and Collection Details  Routing  Result History         Kappa/lambda light chains  Status: Final result  Visible to patient: No (inaccessible in MyChart)  Next appt: 06/18/2019 at 10:15 AM in Oncology ([facility])  Dx: Macrocytic anemia  Order: [id number]    Ref Range & Units 8 d ago  Kappa free light chain 3.3 - 19.4 mg/L 35.6  Lamda free light chains 5.7 - 26.3 mg/L 28.6  Kappa, lamda light chain ratio 0.26 - 1.65 1.24   Comment: (NOTE)  Performed At: [facility]  [address] [place], [place] [id number]  Rush Farmer MD OI:[id number]    Resulting Agency  [facility] CLIN LAB     Specimen Collected: 04/16/19 11:52   Last Resulted: 04/17/19 16:38   Lab Flowsheet  Order Details  View Encounter  Lab and Collection Details  Routing  Result History         Multiple Myeloma Panel (SPEP&IFE w/QIG)  Status: Edited Result - FINAL  Visible to patient: No (inaccessible in MyChart)  Next appt: 06/18/2019 at 10:15 AM in Oncology ([facility])  Dx: Macrocytic anemia  Order: [id number]    Ref Range & Units 8 d ago  IgG (Immunoglobin G), Serum 603 - 1,613 mg/dL 884   IgA 61 - 437 mg/dL 182   IgM (Immunoglobulin M), Srm 15 - 143 mg/dL 49   Total Protein ELP 6.0 - 8.5 g/dL 6.9 VC   Albumin SerPl Elph-Mcnc 2.9 - 4.4 g/dL 4.1 VC   Alpha 1 0.0 - 0.4 g/dL 0.2 VC   Alpha2 Glob SerPl Elph-Mcnc 0.4 - 1.0 g/dL 0.7 VC   B-Globulin SerPl Elph-Mcnc 0.7 - 1.3 g/dL 1.0 VC   Gamma Glob SerPl Elph-Mcnc 0.4 - 1.8 g/dL 0.9 VC   M Protein SerPl Elph-Mcnc Not Observed g/dL Not Observed VC   Globulin, Total 2.2 - 3.9 g/dL 2.8 VC   Albumin/Glob SerPl 0.7 - 1.7 1.5 VC   IFE 1  Comment VC   Comment: (NOTE)  The immunofixation pattern appears unremarkable. Evidence of  monoclonal protein is not apparent.    Please Note  Comment VC   Comment: (NOTE)  Protein electrophoresis scan will follow via computer, mail, or   courier delivery.  Performed At: [facility]  [place], [place] [id number]  Rush Farmer MD ZD:[id number]    Resulting Agency  [facility] CLIN LAB     Specimen Collected: 04/16/19 11:52   Last Resulted: 04/18/19 16:37   Lab Flowsheet  Order Details  View Encounter  Lab and Collection Details  Routing  Result History    VC=Value has a corrected status       Hepatitis C antibody  Status: Final result  Visible to patient: No (inaccessible in MyChart)  Next appt: 06/18/2019 at 10:15 AM in Oncology ([facility])  Dx: Macrocytic anemia  Order: [id number]    Ref Range & Units 8 d ago  HCV Ab NON REACTIVE NON REACTIVE   Comment: (NOTE)  Nonreactive HCV antibody screen is consistent with no HCV infections,  unless recent infection is suspected or other evidence exists to  indicate HCV infection.  Performed at [facility], [place] [address], [place], [place]  [address]    Resulting Agency  [facility] CLIN LAB     Specimen Collected: 04/16/19 11:52   Last Resulted: 04/16/19 17:26   Lab Flowsheet  Order Details  View Encounter  Lab and Collection Details  Routing  Result History         Hepatitis B surface antigen  Status: Final result  Visible to patient: No (inaccessible in MyChart)  Next appt: 06/18/2019 at 10:15 AM in Oncology ([facility])  Dx: Macrocytic anemia  Order: [id number]    Ref Range & Units 8 d ago  Hepatitis B Surface Ag NON REACTIVE NON REACTIVE   Comment: Performed at [facility], [address], [place], [place] [address]  Resulting Agency  [facility] CLIN LAB     Specimen Collected: 04/16/19 11:52   Last Resulted: 04/16/19 16:48   Lab Flowsheet  Order Details  View Encounter  Lab and Collection Details  Routing  Result History         Hepatitis B core antibody, IgM  Status: Final result  Visible to patient: No (inaccessible in MyChart)  Next appt: 06/18/2019 at 10:15 AM in Oncology ([facility])  Dx: Macrocytic anemia   Order: [id number]    Ref Range & Units 8 d ago  Hep B C IgM NON REACTIVE NON REACTIVE   Comment: Performed at [facility], [place] [address], [place], [place] [address]  Resulting Agency  [place] CLIN LAB     Specimen Collected: 04/16/19 11:52   Last Resulted: 04/16/19 17:26   Lab Flowsheet  Order Details  View Encounter  Lab and Collection Details  Routing  Result History         Zinc  Status: Final result  Visible to patient: No (inaccessible in MyChart)  Next appt: 06/18/2019 at 10:15 AM in Oncology ([facility])  Dx: Macrocytic anemia  Order: [id number]    Ref Range & Units 8 d ago  Zinc 44 - 115 ug/dL 77   Comment: (NOTE)  This test was developed and its performance characteristics  determined by [facility]. It has not been cleared or approved  by the Food and Drug Administration.  Detection Limit = 5  **Please note reference interval change**  Performed At: [facility]  [place], [place] [id number]  Rush Farmer MD GQ:[id number]    Resulting Agency  [facility] CLIN LAB     Specimen Collected: 04/16/19 11:52   Last Resulted: 04/18/19 06:37   Lab Flowsheet  Order Details  View Encounter  Lab and Collection Details  Routing  Result History         Copper, serum  Status: Final result  Visible to patient: No (inaccessible in MyChart)  Next appt: 06/18/2019 at 10:15 AM in Oncology ([facility])  Dx: Macrocytic anemia  Order: [id number]    Ref Range & Units 8 d ago  Copper 69 - 132 ug/dL 118   Comment: (NOTE)  This test was developed and its performance characteristics  determined by [facility]. It has not been cleared or approved  by the Food and Drug Administration.  Detection Limit = 5  **Please note reference interval change**  Performed At: [facility]  [place], [place] [id number]  Rush Farmer MD XY:[id number]    Resulting Agency  [facility] CLIN LAB     Specimen Collected: 04/16/19 11:52   Last Resulted:  04/18/19 06:37   Lab Flowsheet  Order Details  View Encounter  Lab and Collection Details  Routing  Result History         Folate  Status: Final result  Visible to patient: No (inaccessible in MyChart)  Next appt: 06/18/2019 at 10:15 AM in Oncology ([facility])  Dx: Macrocytic anemia  Order: [id number]    Ref Range & Units 8 d ago  Folate >5.9 ng/mL 10.8   Comment: Performed at [facility], [address] [place], [place] [address]  Resulting Agency  [facility] CLIN LAB     Specimen Collected: 04/16/19 11:52   Last Resulted: 04/16/19 14:33   Lab Flowsheet  Order Details  View Encounter  Lab and Collection Details  Routing  Result History         Iron and TIBC  Status: Final result  Visible to patient: No (inaccessible in MyChart)  Next appt: 06/18/2019 at 10:15 AM in Oncology ([facility])  Dx: Macrocytic anemia  Order: [id number]    Ref Range & Units 8 d ago  Iron 45 - 182 ug/dL 116   TIBC 250 - 450 ug/dL 370   Saturation Ratios 17.9 - 39.5 % 31   UIBC ug/dL 254   Comment: Performed at [facility], [address], [place], [place] [address]  Resulting Agency  [facility] CLIN LAB     Specimen Collected: 04/16/19 11:52   Last Resulted: 04/16/19 14:33   Lab Flowsheet  Order Details  View Encounter  Lab and Collection  Details  Routing  Result History         Vitamin B12  Status: Final result  Visible to patient: No (inaccessible in MyChart)  Next appt: 06/18/2019 at 10:15 AM in Oncology ([facility])  Dx: Macrocytic anemia  Order: [id number]    Ref Range & Units 8 d ago  Vitamin B-12 180 - 914 pg/mL 977  Comment: (NOTE)  This assay is not validated for testing neonatal or  myeloproliferative syndrome specimens for Vitamin B12 levels.  Performed at [facility], [place] [address], [place], [place]  [address]    Resulting Agency  [facility] CLIN LAB     Specimen Collected: 04/16/19 11:52   Last Resulted: 04/16/19 17:02   Lab Flowsheet   Order Details  View Encounter  Lab and Collection Details  Routing  Result History         Reticulocytes  Status: Final result  Visible to patient: No (inaccessible in MyChart)  Next appt: 06/18/2019 at 10:15 AM in Oncology ([facility])  Dx: Macrocytic anemia  Order: [id number]    Ref Range & Units 8 d ago  Retic Ct Pct 0.4 - 3.1 % 2.5   RBC. 4.22 - 5.81 MIL/uL 2.92  Retic Count, Absolute 19.0 - 186.0 K/uL 72.7   Immature Retic Fract 2.3 - 15.9 % 18.2  Comment: Performed at [facility], [address] [place], [place] [address]  Resulting Agency  [facility] CLIN LAB     Specimen Collected: 04/16/19 11:52   Last Resulted: 04/16/19 12:22   Lab Flowsheet  Order Details  View Encounter  Lab and Collection Details  Routing  Result History         Lactate dehydrogenase  Status: Final result  Visible to patient: No (inaccessible in MyChart)  Next appt: 06/18/2019 at 10:15 AM in Oncology ([facility])  Dx: Macrocytic anemia  Order: [id number]    Ref Range & Units 8 d ago  LDH 98 - 192 U/L 169   Comment: Performed at [facility], [address] [place], [place] [address]  Resulting Agency  [facility] CLIN LAB     Specimen Collected: 04/16/19 11:52   Last Resulted: 04/16/19 12:32   Lab Flowsheet  Order Details  View Encounter  Lab and Collection Details  Routing  Result History         Haptoglobin  Status: Final result  Visible to patient: No (inaccessible in MyChart)  Next appt: 06/18/2019 at 10:15 AM in Oncology ([facility])  Dx: Macrocytic anemia  Order: [id number]    Ref Range & Units 8 d ago  Haptoglobin 34 - 355 mg/dL 129   Comment: (NOTE)  Performed At: [facility]  [place], [place] [id number]  Rush Farmer MD EY:[id number]    Resulting Agency  [facility] CLIN LAB     Specimen Collected: 04/16/19 11:52   Last Resulted: 04/17/19 06:37   Lab Flowsheet  Order Details  View Encounter  Lab and Collection Details  Routing   Result History         Ferritin  Status: Final result  Visible to patient: No (inaccessible in [place])  Next appt: 06/18/2019 at 10:15 AM in Oncology ([facility])  Dx: Macrocytic anemia  Order: [id number]    Ref Range & Units 8 d ago  Ferritin 24 - 336 ng/mL 44   Comment: Performed at [facility], [address] [place], [place] [address]  [place]  [facility] [place]  Specimen Collected: 04/16/19 11:52   Last Resulted: 04/16/19 14:33   Lab Flowsheet  Order Details  View Encounter  Lab and Collection Details  Routing  Result History         Erythropoietin  Status: Final result  Visible to patient: No (inaccessible in MyChart)  Next appt: 06/18/2019 at 10:15 AM in Oncology ([facility])  Dx: Macrocytic anemia  Order: [id number]    Ref Range & Units 8 d ago  Erythropoietin 2.6 - 18.5 mIU/mL 327.1  Comment: (NOTE)  [employer] UniCel DxI 800 Immunoassay System  Values obtained with different assay methods or kits cannot be used  interchangeably. Results cannot be interpreted as absolute evidence  of the presence or absence of malignant disease.  Performed At: [facility]  [place], [place] [id number]  Rush Farmer MD SH:[id number]    Resulting Agency  [facility] CLIN LAB     Specimen Collected: 04/16/19 11:52   Last Resulted: 04/17/19 06:37

## 2019-04-24 NOTE — Telephone Encounter (Signed)
Called wife and told her that Dr Rogue Bussing will call later this afternoon to discuss lab results

## 2019-04-24 NOTE — Telephone Encounter (Signed)
Evan [name]- inform wife- that I will reach out to them this afternoon- to discuss the results. GB

## 2019-04-25 ENCOUNTER — Telehealth: Payer: Self-pay | Admitting: Internal Medicine

## 2019-04-25 NOTE — Telephone Encounter (Signed)
On 3/31-spoke to patient's wife regarding patient's labs-stable anemia.  No obvious reason noted for ongoing anemia on the recent blood work.  Would still recommend a bone marrow biopsy.  As this seems to be a chronic problem-I think it is reasonable to wait for couple of months for the bone marrow biopsy per patient's preference  GB

## 2019-06-04 ENCOUNTER — Other Ambulatory Visit: Payer: Self-pay | Admitting: Physician Assistant

## 2019-06-06 ENCOUNTER — Other Ambulatory Visit: Payer: Self-pay

## 2019-06-06 MED ORDER — HYDRALAZINE HCL 25 MG PO TABS: 25.0000 mg | ORAL_TABLET | Freq: Two times a day (BID) | ORAL | 0 refills | Status: DC

## 2019-06-10 ENCOUNTER — Telehealth: Payer: Self-pay | Admitting: Cardiology

## 2019-06-10 MED ORDER — HYDRALAZINE HCL 25 MG PO TABS: 25.0000 mg | ORAL_TABLET | Freq: Two times a day (BID) | ORAL | 3 refills | Status: AC

## 2019-06-10 MED ORDER — AMLODIPINE BESYLATE 5 MG PO TABS: 5.0000 mg | ORAL_TABLET | Freq: Every day | ORAL | 3 refills | Status: DC

## 2019-06-10 NOTE — Telephone Encounter (Signed)
Wife of the patient called. The wife asked to speak specifically to [facility] and to have her call her on her cell phone

## 2019-06-10 NOTE — Telephone Encounter (Signed)
Returned call to patient's wife she stated pharmacy sent in a request to refill Hyrdralazine and Amlodipine last week and have not heard back.Advised I will send in 90 day refills for both.She stated husband has been sob with the least exertion.Stated he cannot hardly walk from room to room.Hard to take a shower.Spoke to patient he stated he has been sob for the past couple of months.Appointment scheduled with Dr.Jordan 06/13/19 at 4:20 pm.

## 2019-06-11 NOTE — Progress Notes (Signed)
Cardiology Office Note    Date:  06/13/2019   ID:  Evan, [name] [birth year], MRN [id number]  PCP:  Evan Ramsay, MD  Cardiologist: Dr. [name] Electrophysiologist Dr. Curt [name] PV specialist: Dr. Burt [name]  Chief Complaint  Patient presents with  . Congestive Heart Failure  . Coronary Artery Disease    History of Present Illness:  Evan [name] is a 79 y.o. male seen for follow up CAD and prior TAVR. He has a  past medical history of CAD s/p CABG 2005, hypertension, hyperlipidemia, carotid artery disease, permanent atrial fibrillation, severe aortic stenosis s/p TAVR, DM 2, and pacemaker.  Cardiac catheterization in 2007 demonstrated patent grafts.  Myoview in 2013 showed no significant abnormality.  He had left carotid enterectomy in May 2014.  He is also intolerant to statins due to severe myalgia and weakness.  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  Sleep study in October 2017 only showed borderline sleep apnea with minimal desaturation.    Follow-up echocardiogram obtained on 03/01/2017 showed the patient's aortic stenosis has significantly progressed since 2017.  Dr. Curt [name] recommended permanent pacemaker placement due to severe conduction disease and this was placed at the end of February. He eventually had St Jude MRI compatible dual-chamber pacemaker implanted on 03/23/2017.  He was evaluated by Dr. Burt [name] for TAVR and eventually underwent the procedure with Dr. Cyndia [name] and Dr. Burt [name] with placement of Edwards Sapien 3 THV size 26 mm bioprosthetic valve on 05/09/17.  He had no complications. Follow up Echo in May showed a good result. ASA was discontinued in November. On Eliquis.  Normal pacemaker evaluation with Dr Curt [name] in March.  He has been evaluated by oncology for macrocytic anemia. Noted possible cirrhosis  on piror CT. Extensive blood work done and was unremarkable. Bone marrow biopsy considered but patient declined  On follow up today he notes a lot of sinus drainage at night to the point it sometimes chokes him and he has to sit up. Will sleep in a recliner. Notes more SOB and leg edema. Weight up 3 lbs. No chest pain. SOB has progressed to the point that he uses a scooter to get around.     Past Medical History:  Diagnosis Date  . Anemia   . Carotid arterial disease ([place])   . CHF (congestive heart failure) ([place])   . Coronary artery disease   . Hearing loss   . HOH (hard of hearing)   . Hyperlipidemia   . Hypertension   . Nocturia   . Obesity   . Osteoarthritis    "BUE; BLE; right knee" (03/23/2017)  . Peripheral vision loss, bilateral    S/P 07/2015  . Permanent atrial fibrillation ([place])   . 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 Severe aortic stenosis   . Sinus drainage   . Stroke [facility]) 07/2015   "peripheral vision still bad out of left eye" (03/23/2017)  . Torn rotator cuff 2012   Right arm  . Type II diabetes mellitus ([place])   . Urinary frequency     Past Surgical History:  Procedure Laterality Date  . BACK SURGERY    . CARDIAC CATHETERIZATION  11/29/2005   SEVERE THREE VESSEL OBSTRUCTIVE ATHERSCLEROTIC CAD. ALL GRAFTS WERE PATENT. EF 65%  . CARDIAC CATHETERIZATION  03/23/2017  . CAROTID ENDARTERECTOMY Right 01-29-08  cea  . CARPAL TUNNEL RELEASE Right   . CORONARY ARTERY BYPASS GRAFT  03/2003   X3. LIMA GRAFT TO THE LAD, SAPHENOUS VEIN GRAFT TO THE PA, AND A LEFT RADIAL GRAFT TO THEOBTUSE MARGINAL VESSEL  . ENDARTERECTOMY Left 05/28/2012   Procedure: ENDARTERECTOMY CAROTID;  Surgeon: Evan Dutch, MD;  Location: [facility];  Service: Vascular;  Laterality: Left;  . INSERT / REPLACE / REMOVE PACEMAKER  03/23/2017  . [place]; 1984  . MOLE SURGERY     "? face/arms"  . PACEMAKER IMPLANT N/A 03/23/2017   Procedure: PACEMAKER IMPLANT;   Surgeon: Evan Haw, MD;  Location: [facility];  Service: Cardiovascular;  Laterality: N/A;  . PATCH ANGIOPLASTY Left 05/28/2012   Procedure: WITH dACRON PATCH ANGIOPLASTY;  Surgeon: Evan Dutch, MD;  Location: [facility];  Service: Vascular;  Laterality: Left;  . RIGHT/LEFT HEART CATH AND CORONARY/GRAFT ANGIOGRAPHY N/A 03/23/2017   Procedure: RIGHT/LEFT HEART CATH AND CORONARY/GRAFT ANGIOGRAPHY;  Surgeon: Sherren Mocha, MD;  Location: [facility];  Service: Cardiovascular;  Laterality: N/A;  . TEE WITHOUT CARDIOVERSION N/A 05/09/2017   Procedure: TRANSESOPHAGEAL ECHOCARDIOGRAM (TEE);  Surgeon: Sherren Mocha, MD;  Location: [facility];  Service: Open Heart Surgery;  Laterality: N/A;  . TRANSCATHETER AORTIC VALVE REPLACEMENT, TRANSFEMORAL N/A 05/09/2017   Procedure: TRANSCATHETER AORTIC VALVE REPLACEMENT, TRANSFEMORAL using an Edwards 73m Sapien 3 Aortic Valve;  Surgeon: Evan Mocha MD;  Location: [facility]  Service: Open Heart Surgery;  Laterality: N/A;    Current Medications: Outpatient Medications Prior to Visit  Medication Sig Dispense Refill  . acetaminophen (TYLENOL) 650 MG CR tablet Take 1,300 mg by mouth every 8 (eight) hours as needed for pain.     .Marland KitchenamLODipine (NORVASC) 5 MG tablet Take 1 tablet (5 mg total) by mouth daily. 90 tablet 3  . benazepril (LOTENSIN) 40 MG tablet TAKE 1 TABLET BY MOUTH EVERY DAY 90 tablet 3  . benzonatate (TESSALON) 100 MG capsule Take by mouth.    . Camphor-Menthol-Methyl Sal (MUSCLE RUB ULTRA STRENGTH) 05-03-28 % CREA Apply 1 application topically 3 (three) times daily as needed (muscle pain).    . Cyanocobalamin (RA VITAMIN B-12 TR) 1000 MCG TBCR Take 1,000 mcg by mouth daily.     . cyanocobalamin 1000 MCG tablet Take by mouth.    .[name]5 MG TABS tablet TAKE 1 TABLET BY MOUTH EVERY 12 HOURS 180 tablet 1  . ezetimibe (ZETIA) 10 MG tablet TAKE 1 TABLET BY MOUTH EVERY DAY 90 tablet 2  . fluticasone (FLONASE) 50 MCG/ACT nasal spray  Place 2 sprays into both nostrils daily.    .Marland Kitchenglimepiride (AMARYL) 2 MG tablet TAKE 1 TABLET (2 MG TOTAL) BY MOUTH DAILY WITH BREAKFAST 90 tablet 0  . guaiFENesin-codeine 100-10 MG/5ML syrup Take 5 mLs by mouth every 6 (six) hours as needed for cough.     . hydrALAZINE (APRESOLINE) 25 MG tablet Take 1 tablet (25 mg total) by mouth 2 (two) times daily. 180 tablet 3  . hydrochlorothiazide (HYDRODIURIL) 25 MG tablet TAKE 1 TABLET BY MOUTH EVERY DAY IN THE MORNING 90 tablet 0  . OVER THE COUNTER MEDICATION Replenex tablets: Take 3 tablet by mouth every morning (joint health)    . Polyvinyl Alcohol-Povidone (REFRESH OP) Place 2 drops into both eyes daily as needed (for dry eyes).     . tamsulosin (FLOMAX) 0.4 MG CAPS capsule TAKE 1 CAPSULE BY MOUTH ONCE DAILY (Patient taking differently: TAKE 0.4  MG BY MOUTH ONCE DAILY IN THE EVENING) 90 capsule 3  . furosemide (LASIX) 20 MG tablet TAKE 1 TABLET BY MOUTH EVERY DAY 90 tablet 3   No facility-administered medications prior to visit.     Allergies:   Niacin, Niacin and related, Metformin and related, and Statins   Social History   Socioeconomic History  . Marital status: Married    Spouse name: Not on file  . Number of children: 5  . Years of education: Not on file  . Highest education level: Not on file  Occupational History  . Occupation: retired    Fish farm manager: RETIRED  Tobacco Use  . Smoking status: Former Smoker    Packs/day: 2.00    Years: 39.00    Pack years: 78.00    Types: Cigarettes    Quit date: 01/24/1994    Years since quitting: 25.4  . Smokeless tobacco: Never Used  Substance and Sexual Activity  . Alcohol use: No  . Drug use: No  . Sexual activity: Never  Other Topics Concern  . Not on file  Social History Narrative   On Farm/ in [place]; no alcohol; quit smoking- 96; worked in Estate agent.    Social Determinants of Health   Financial Resource Strain:   . Difficulty of Paying Living Expenses:   Food  Insecurity:   . Worried About Charity fundraiser in the Last Year:   . Arboriculturist in the Last Year:   Transportation Needs:   . Film/video editor (Medical):   Marland Kitchen Lack of Transportation (Non-Medical):   Physical Activity:   . Days of Exercise per Week:   . Minutes of Exercise per Session:   Stress:   . Feeling of Stress :   Social Connections:   . Frequency of Communication with Friends and Family:   . Frequency of Social Gatherings with Friends and Family:   . Attends Religious Services:   . Active Member of Clubs or Organizations:   . Attends Archivist Meetings:   Marland Kitchen Marital Status:      Family History:  The patient's family history includes Arrhythmia in his brother; Cancer in his brother; Heart attack in his brother and father; Parkinsonism in his brother.   ROS:   Please see the history of present illness.    ROS All other systems reviewed and are negative.   PHYSICAL EXAM:   VS:  BP 128/62   Pulse 64   Wt 275 lb 6.4 oz (124.9 kg)   SpO2 92%   BMI 37.35 kg/m    GEN: Well nourished, obese, in no acute distress  HEENT: normal  Neck: no JVD, carotid bruits, or masses Cardiac: RRR; gr 1/6 SEM, rubs, or gallops, 1+ pitting edema  +pacemaker present Respiratory:  clear to auscultation bilaterally, normal work of breathing GI: soft, nontender, nondistended, + BS MS: no deformity or atrophy  Skin: warm and dry, no rash Neuro:  Alert and Oriented x 3, Strength and sensation are intact Psych: euthymic mood, full affect  Wt Readings from Last 3 Encounters:  06/13/19 275 lb 6.4 oz (124.9 kg)  04/16/19 272 lb (123.4 kg)  03/28/19 269 lb 9.6 oz (122.3 kg)      Studies/Labs Reviewed:   EKG:  EKG is  ordered today. V paced at 64 bpm. I have personally reviewed and interpreted this study.   Recent Labs: 08/06/2018: ALT 21 03/28/2019: BUN 30; Creatinine, Ser 1.49; Potassium 5.3; Sodium 143 04/16/2019: Hemoglobin  9.5; Platelets 309   Lipid Panel      Component Value Date/Time   CHOL 152 08/06/2018 1201   TRIG 107 08/06/2018 1201   HDL 42 08/06/2018 1201   CHOLHDL 3.6 08/06/2018 1201   CHOLHDL 2.3 12/11/2015 0854   VLDL 12 12/11/2015 0854   LDLCALC 89 08/06/2018 1201   Labs dated 07/31/17; Hgb 10.6. Glucose 137. Otherwise CMET normal. Cholesterol 110, triglycerides 74, HDL 49, LDL 46. Labs dated 12/07/18: Hgb 9.9. glucose 127. Otherwise CMET normal. A1c 6.5%. cholesterol 139, triglycerides 96, HDL 42, LDL 78.      Additional studies/ records that were reviewed today include:   Cath 03/23/2017 Conclusion     Mid RCA lesion is 100% stenosed.  Ost LM to Dist LM lesion is 70% stenosed.  Prox Cx lesion is 90% stenosed.  Ost 2nd Mrg lesion is 100% stenosed.  Prox LAD-1 lesion is 90% stenosed.  Prox LAD-2 lesion is 100% stenosed.  LIMA and is normal in caliber.  The graft exhibits no disease.  SVG graft was visualized by angiography and is normal in caliber.  Left radial artery graft was visualized by angiography.   1.  Severe native three-vessel coronary artery disease with total occlusion of the RCA, severe left main stenosis, total occlusion of the LAD with severe calcification, and severe stenosis of the circumflex and its branch vessels.  2.  Status post aortocoronary bypass surgery with continued patency of the LIMA to LAD, continued patency of the free radial graft to OM with tight stenosis at the distal anastomosis involving a small caliber vessel, and continued patency of the saphenous vein graft to PDA without any significant disease.  3.  Severe aortic valve stenosis with a mean gradient of 40 mmHg.  The calculated valve area is 1.74 cm likely related to high cardiac output  4.  Moderate pulmonary hypertension with a PA pressure of 72/25 with a mean of 38   Recommendations: The patient will undergo permanent pacemaker placement for symptomatic atrial fibrillation with slow ventricular rate.  He will  continue to undergo evaluation by the multidisciplinary heart team for consideration of treatment options for his severe aortic stenosis.      Limited echo 05/10/2017 LV EF: 60% -   65% Study Conclusions  - Left ventricle: The cavity size was normal. Systolic function was   normal. The estimated ejection fraction was in the range of 60%   to 65%. Wall motion was normal; there were no regional wall   motion abnormalities. - Ventricular septum: The contour showed diastolic flattening. - Aortic valve: A TAVR bioprosthesis was present. There was no   regurgitation. Peak velocity (S): 207 cm/s. Mean gradient (S): 7   mm Hg. Valve area (VTI): 1.65 cm^2. Valve area (Vmax): 1.44 cm^2.   Valve area (Vmean): 1.77 cm^2. - Mitral valve: Moderately calcified annulus. - Pulmonary arteries: Systolic pressure was moderately to severely   increased. PA peak pressure: 65 mm Hg (S).  Echo 06/14/17:Study Conclusions  - Left ventricle: The cavity size was normal. Wall thickness was   increased in a pattern of moderate LVH. Indeterminant diastolic   function. The estimated ejection fraction was 55%. Although no   diagnostic regional wall motion abnormality was identified, this   possibility cannot be completely excluded on the basis of this   study. - Aortic valve: Bioprosthetic aortic valve s/p TAVR. There was no   stenosis. There was no regurgitation. Mean gradient (S): 12 mm   Hg. - Mitral  valve: Moderately calcified annulus. - Left atrium: The atrium was severely dilated. - Right ventricle: The cavity size was mildly dilated. Pacer wire   or catheter noted in right ventricle. Systolic function was   normal. - Right atrium: The atrium was mildly dilated. - Tricuspid valve: Peak RV-RA gradient (S): 36 mm Hg. - Pulmonary arteries: PA peak pressure: 39 mm Hg (S). - Inferior vena cava: The vessel was normal in size. The   respirophasic diameter changes were in the normal range (>= 50%),    consistent with normal central venous pressure.  Impressions:  - Normal LV size with moderate LV hypertrophy. EF 55%. Mildly   dilated RV with normal systolic function. Bioprosthetic aortic   valve s/p TAVR, no significant stenosis or regurgitation. Mild   pulmonary hypertension.    Carotid dopplers 08/28/18: Summary: Right Carotid: Velocities in the right ICA are consistent with a 1-39% stenosis.                The RICA velocities remain within normal range and stable                compared to the prior exam.  Left Carotid: Velocities in the left ICA are consistent with a 1-39% stenosis.               The LICA velocities remain within normal range and stable compared               to the prior exam.  Vertebrals:  Left vertebral artery demonstrates antegrade flow. Right vertebral              artery demonstrates high resistant flow. Subclavians: Right subclavian artery was stenotic. Right subclavian artery flow              was disturbed. Normal flow hemodynamics were seen in the left              subclavian artery. Unable to visualize the right innominate.                Technically challenging exam secondary to short neck and neck              girth.  *See table(s) above for measurements and observations.     Electronically signed by Carlyle Dolly MD on 08/30/2018 at 4:28:17 PM.   Echo 08/27/18: IMPRESSIONS    1. The left ventricle has normal systolic function, with an ejection  fraction of 55-60%. The cavity size was normal. There is moderately  increased left ventricular wall thickness. Left ventricular diastolic  function could not be evaluated secondary to  atrial fibrillation. Elevated mean left atrial pressure.  2. The right ventricle has normal systolic function. The cavity was  moderately enlarged. Right ventricular systolic pressure is mildly  elevated with an estimated pressure of 33.2 mmHg.  3. Left atrial size was severely dilated.  4. Right  atrial size was severely dilated.  5. The mitral valve is abnormal. There is moderate mitral annular  calcification present.  6. The tricuspid valve is grossly normal.  7. A Edwards Sapien bioprosthetic aortic valve (TAVR) valve is present in  the aortic position. Procedure Date: 04/29/2017 Normal aortic valve  prosthesis.  8. The aorta is normal in size and structure.  9. The inferior vena cava was dilated in size with >50% respiratory  variability.  10. Normal LV systolic function; moderate LVH; biatrial enlargement;  moderate RVE; s/p AVR with mean gradient of 17 mmHg and no AI.  ASSESSMENT:    1. Permanent atrial fibrillation ([place])   2. S/p TAVR (transcatheter aortic valve replacement), bioprosthetic   3. Coronary artery disease involving coronary bypass graft of native heart without angina pectoris   4. Essential hypertension   5. S/P placement of cardiac pacemaker      PLAN:  In order of problems listed above:  1. CAD s/p CABG: Patient underwent a cardiac catheterization in February 2019, grafts still patent. Continued medical therapy was recommended.  No angina.  2. Severe aortic stenosis s/p TAVR in April 2019: Echo in August 2020 showed good valve function. On Eliquis for Afib. ASA discontinued.   3. History of permanent atrial fibrillation with slow ventricular rate: S/p pacemaker. Now V paced. On Eliquis 5 mg twice daily.  4.   Chronic  diastolic CHF. On lasix and HCTZ. Appears volume overloaded. Will increase lasix to 40 mg daily. Discussed importance of sodium restriction.   4. Hypertension: BP well controlled.   5. Hyperlipidemia: on Crestor- good control.  6. Carotid artery disease: History of left carotid endarterectomy in 2014.  Stable on last ultrasound August 2020- patent.   7.   S/p right PCA CVA with visual cut.   8. Macrocytic anemia. Plan to follow Hgb. If worsens my need to consider bone marrow biospy.    Medication Adjustments/Labs and  Tests Ordered: Current medicines are reviewed at length with the patient today.  Concerns regarding medicines are outlined above.  Medication changes, Labs and Tests ordered today are listed in the Patient Instructions below. Patient Instructions  Increase Lasix to 40 mg daily  Continue your other therapy    Signed, Dreanna Kyllo [name], MD  06/13/2019 4:37 PM    [facility] [address] [place], [place], [place]  [address] Phone: [phone]; Fax: [phone]

## 2019-06-13 ENCOUNTER — Ambulatory Visit (INDEPENDENT_AMBULATORY_CARE_PROVIDER_SITE_OTHER): Payer: Medicare Other | Admitting: Cardiology

## 2019-06-13 ENCOUNTER — Other Ambulatory Visit: Payer: Self-pay

## 2019-06-13 ENCOUNTER — Encounter: Payer: Self-pay | Admitting: Cardiology

## 2019-06-13 VITALS — BP 128/62 | HR 64 | Wt 275.4 lb

## 2019-06-13 DIAGNOSIS — I6522 Occlusion and stenosis of left carotid artery: Secondary | ICD-10-CM

## 2019-06-13 DIAGNOSIS — I4821 Permanent atrial fibrillation: Secondary | ICD-10-CM

## 2019-06-13 DIAGNOSIS — Z953 Presence of xenogenic heart valve: Secondary | ICD-10-CM | POA: Diagnosis not present

## 2019-06-13 DIAGNOSIS — I10 Essential (primary) hypertension: Secondary | ICD-10-CM | POA: Diagnosis not present

## 2019-06-13 DIAGNOSIS — Z950 Presence of cardiac pacemaker: Secondary | ICD-10-CM

## 2019-06-13 DIAGNOSIS — I25810 Atherosclerosis of coronary artery bypass graft(s) without angina pectoris: Secondary | ICD-10-CM

## 2019-06-13 DIAGNOSIS — D539 Nutritional anemia, unspecified: Secondary | ICD-10-CM

## 2019-06-13 MED ORDER — FUROSEMIDE 40 MG PO TABS
40.0000 mg | ORAL_TABLET | Freq: Every day | ORAL | 3 refills | Status: DC
Start: 2019-06-13 — End: 2019-11-27

## 2019-06-13 NOTE — Patient Instructions (Signed)
Increase Lasix to 40 mg daily  Continue your other therapy

## 2019-06-18 ENCOUNTER — Inpatient Hospital Stay: Payer: Medicare Other

## 2019-06-18 ENCOUNTER — Inpatient Hospital Stay: Payer: Medicare Other | Admitting: Internal Medicine

## 2019-06-19 ENCOUNTER — Ambulatory Visit (INDEPENDENT_AMBULATORY_CARE_PROVIDER_SITE_OTHER): Payer: Medicare Other | Admitting: *Deleted

## 2019-06-19 DIAGNOSIS — I4821 Permanent atrial fibrillation: Secondary | ICD-10-CM | POA: Diagnosis not present

## 2019-06-19 LAB — CUP PACEART REMOTE DEVICE CHECK
Battery Remaining Longevity: 139 mo
Battery Remaining Percentage: 95.5 %
Battery Voltage: 3.01 V
Brady Statistic RV Percent Paced: 98 %
Date Time Interrogation Session: 20210526084015
Implantable Lead Implant Date: 20190228
Implantable Lead Location: 753860
Implantable Pulse Generator Implant Date: 20190228
Lead Channel Impedance Value: 410 Ohm
Lead Channel Pacing Threshold Amplitude: 0.625 V
Lead Channel Pacing Threshold Pulse Width: 0.5 ms
Lead Channel Sensing Intrinsic Amplitude: 11.2 mV
Lead Channel Setting Pacing Amplitude: 0.875
Lead Channel Setting Pacing Pulse Width: 0.5 ms
Lead Channel Setting Sensing Sensitivity: 2 mV
Pulse Gen Model: 1272
Pulse Gen Serial Number: 8971140

## 2019-06-20 NOTE — Progress Notes (Signed)
Remote pacemaker transmission.   

## 2019-07-30 ENCOUNTER — Telehealth: Payer: Self-pay | Admitting: Cardiology

## 2019-07-30 NOTE — Telephone Encounter (Signed)
Pt's wife wants to know if Dr. [name] or another doctor is going to check his valve when they come to his appt on 10/17/19. Please call to verify or discuss

## 2019-07-30 NOTE — Telephone Encounter (Signed)
Returned call to [place] she was wondering if pt is going to need another "test" for is valve. Explained that Dr [name] will review and decide at appt if it is time for another. Verbalizes understanding. They will arrive early wearing a mask

## 2019-08-12 ENCOUNTER — Other Ambulatory Visit: Payer: Self-pay | Admitting: Infectious Diseases

## 2019-08-12 DIAGNOSIS — R0609 Other forms of dyspnea: Secondary | ICD-10-CM

## 2019-08-12 DIAGNOSIS — R053 Chronic cough: Secondary | ICD-10-CM

## 2019-08-19 ENCOUNTER — Telehealth: Payer: Self-pay | Admitting: Cardiology

## 2019-08-19 NOTE — Telephone Encounter (Signed)
He can increase lasix back to 40 mg. His Echo was last done last August so we can update next month.  Collier [place]

## 2019-08-19 NOTE — Telephone Encounter (Signed)
New Message:    Please call, questions about pt's medicine.

## 2019-08-19 NOTE — Telephone Encounter (Signed)
Returned call to patient's wife.Dr.Jordan's advice left on personal voice mail.

## 2019-08-19 NOTE — Telephone Encounter (Signed)
Returned call to patient's wife.She stated husband saw PCP Dr.Fitzgerald in June he decreased Lasix to 20 mg daily due to B/P low, systolic 628.Stated he has been having swelling in both feet.Today B/P 134/67.She wanted to ask Dr.Jordan if ok to increase Lasix back to 40 mg daily.She also wanted to know when he needs next echo.Advised I will send message to [place] for advice.

## 2019-09-18 ENCOUNTER — Ambulatory Visit (INDEPENDENT_AMBULATORY_CARE_PROVIDER_SITE_OTHER): Payer: Medicare Other | Admitting: *Deleted

## 2019-09-18 DIAGNOSIS — I4821 Permanent atrial fibrillation: Secondary | ICD-10-CM | POA: Diagnosis not present

## 2019-09-19 LAB — CUP PACEART REMOTE DEVICE CHECK
Battery Remaining Longevity: 140 mo
Battery Remaining Percentage: 95.5 %
Battery Voltage: 3.01 V
Brady Statistic RV Percent Paced: 98 %
Date Time Interrogation Session: 20210825032551
Implantable Lead Implant Date: 20190228
Implantable Lead Location: 753860
Implantable Pulse Generator Implant Date: 20190228
Lead Channel Impedance Value: 450 Ohm
Lead Channel Pacing Threshold Amplitude: 0.625 V
Lead Channel Pacing Threshold Pulse Width: 0.5 ms
Lead Channel Sensing Intrinsic Amplitude: 9.9 mV
Lead Channel Setting Pacing Amplitude: 0.875
Lead Channel Setting Pacing Pulse Width: 0.5 ms
Lead Channel Setting Sensing Sensitivity: 2 mV
Pulse Gen Model: 1272
Pulse Gen Serial Number: 8971140

## 2019-09-23 NOTE — Progress Notes (Signed)
Remote pacemaker transmission.   

## 2019-10-12 NOTE — Progress Notes (Signed)
Cardiology Office Note    Date:  10/17/2019   ID:  Jp, Eastham [birth year], MRN [id number]  PCP:  Leonel Ramsay, MD  Cardiologist: Dr. [name] Electrophysiologist Dr. Curt Bears PV specialist: Dr. Burt Knack  Chief Complaint  Patient presents with  . Coronary Artery Disease  . Aortic Stenosis    History of Present Illness:  Evan [name] is a 79 y.o. male seen for follow up CAD and prior TAVR. He has a  past medical history of CAD s/p CABG 2005, hypertension, hyperlipidemia, carotid artery disease, permanent atrial fibrillation, severe aortic stenosis s/p TAVR, DM 2, and pacemaker.  Cardiac catheterization in 2007 demonstrated patent grafts.  Myoview in 2013 showed no significant abnormality.  He had left carotid enterectomy in May 2014.  He is also intolerant to statins due to severe myalgia and weakness.  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  Sleep study in October 2017 only showed borderline sleep apnea with minimal desaturation.    Follow-up echocardiogram obtained on 03/01/2017 showed the patient's aortic stenosis has significantly progressed since 2017.  Dr. Curt Bears recommended permanent pacemaker placement due to severe conduction disease and this was placed at the end of February. He eventually had St Jude MRI compatible dual-chamber pacemaker implanted on 03/23/2017.  He was evaluated by Dr. Burt Knack for TAVR and eventually underwent the procedure with Dr. Cyndia Bent and Dr. Burt Knack with placement of Edwards Sapien 3 THV size 26 mm bioprosthetic valve on 05/09/17.  He had no complications. Follow up Echo in May showed a good result. ASA was discontinued in November. On Eliquis.  Normal pacemaker evaluation 09/18/19  He has been evaluated by oncology for macrocytic anemia. Noted possible cirrhosis on piror CT. Extensive blood  work done and was unremarkable. Bone marrow biopsy considered but patient declined.   On follow up today he notes a lot of sinus drainage at night to the point it sometimes chokes him and he has to sit up. Will sleep in a recliner. Wife states he sleeps a lot during the day and then is up at night. Has low BP at times. Lasix was reduced to 20 mg but he had increased swelling so we increased it back to 40 mg daily. Weight is down 7 lbs. Appetite is poor. Given Rx for nerve/depression pill but he will not take it. He is SOB with any activity.     Past Medical History:  Diagnosis Date  . Anemia   . Carotid arterial disease ([place])   . CHF (congestive heart failure) ([place])   . Coronary artery disease   . Hearing loss   . HOH (hard of hearing)   . Hyperlipidemia   . Hypertension   . Nocturia   . Obesity   . Osteoarthritis    "BUE; BLE; right knee" (03/23/2017)  . Peripheral vision loss, bilateral    S/P 07/2015  . Permanent atrial fibrillation ([place])   . 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 Severe aortic stenosis   . Sinus drainage   . Stroke [facility]) 07/2015   "peripheral vision still bad out of left eye" (03/23/2017)  . Torn rotator cuff 2012   Right arm  . Type II diabetes mellitus ([place])   . Urinary frequency     Past Surgical History:  Procedure Laterality Date  . BACK SURGERY    .  CARDIAC CATHETERIZATION  11/29/2005   SEVERE THREE VESSEL OBSTRUCTIVE ATHERSCLEROTIC CAD. ALL GRAFTS WERE PATENT. EF 65%  . CARDIAC CATHETERIZATION  03/23/2017  . CAROTID ENDARTERECTOMY Right 01-29-08   cea  . CARPAL TUNNEL RELEASE Right   . CORONARY ARTERY BYPASS GRAFT  03/2003   X3. LIMA GRAFT TO THE LAD, SAPHENOUS VEIN GRAFT TO THE PA, AND A LEFT RADIAL GRAFT TO THEOBTUSE MARGINAL VESSEL  . ENDARTERECTOMY Left 05/28/2012   Procedure: ENDARTERECTOMY CAROTID;  Surgeon: Elam Dutch, MD;  Location: [facility];  Service: Vascular;  Laterality: Left;  . INSERT / REPLACE / REMOVE PACEMAKER   03/23/2017  . [place]; 1984  . MOLE SURGERY     "? face/arms"  . PACEMAKER IMPLANT N/A 03/23/2017   Procedure: PACEMAKER IMPLANT;  Surgeon: Constance Haw, MD;  Location: [facility] [place] CV LAB;  Service: Cardiovascular;  Laterality: N/A;  . PATCH ANGIOPLASTY Left 05/28/2012   Procedure: WITH dACRON PATCH ANGIOPLASTY;  Surgeon: Elam Dutch, MD;  Location: [facility];  Service: Vascular;  Laterality: Left;  . RIGHT/LEFT HEART CATH AND CORONARY/GRAFT ANGIOGRAPHY N/A 03/23/2017   Procedure: RIGHT/LEFT HEART CATH AND CORONARY/GRAFT ANGIOGRAPHY;  Surgeon: Sherren Mocha, MD;  Location: [facility] CV LAB;  Service: Cardiovascular;  Laterality: N/A;  . TEE WITHOUT CARDIOVERSION N/A 05/09/2017   Procedure: TRANSESOPHAGEAL ECHOCARDIOGRAM (TEE);  Surgeon: Sherren Mocha, MD;  Location: [facility];  Service: Open Heart Surgery;  Laterality: N/A;  . TRANSCATHETER AORTIC VALVE REPLACEMENT, TRANSFEMORAL N/A 05/09/2017   Procedure: TRANSCATHETER AORTIC VALVE REPLACEMENT, TRANSFEMORAL using an Edwards 68m Sapien 3 Aortic Valve;  Surgeon: CSherren Mocha MD;  Location: [facility]  Service: Open Heart Surgery;  Laterality: N/A;    Current Medications: Outpatient Medications Prior to Visit  Medication Sig Dispense Refill  . acetaminophen (TYLENOL) 650 MG CR tablet Take 1,300 mg by mouth every 8 (eight) hours as needed for pain.     . benazepril (LOTENSIN) 40 MG tablet TAKE 1 TABLET BY MOUTH EVERY DAY 90 tablet 3  . Camphor-Menthol-Methyl Sal (MUSCLE RUB ULTRA STRENGTH) 05-03-28 % CREA Apply 1 application topically 3 (three) times daily as needed (muscle pain).    . Cyanocobalamin (RA VITAMIN B-12 TR) 1000 MCG TBCR Take 1,000 mcg by mouth daily.     . cyanocobalamin 1000 MCG tablet Take by mouth.    .[name]5 MG TABS tablet TAKE 1 TABLET BY MOUTH EVERY 12 HOURS 180 tablet 1  . ezetimibe (ZETIA) 10 MG tablet TAKE 1 TABLET BY MOUTH EVERY DAY 90 tablet 2  . fluticasone (FLONASE) 50 MCG/ACT nasal spray  Place 2 sprays into both nostrils daily.    . furosemide (LASIX) 40 MG tablet Take 1 tablet (40 mg total) by mouth daily. 90 tablet 3  . glimepiride (AMARYL) 2 MG tablet TAKE 1 TABLET (2 MG TOTAL) BY MOUTH DAILY WITH BREAKFAST 90 tablet 0  . hydrALAZINE (APRESOLINE) 25 MG tablet Take 1 tablet (25 mg total) by mouth 2 (two) times daily. 180 tablet 3  . hydrochlorothiazide (HYDRODIURIL) 25 MG tablet TAKE 1 TABLET BY MOUTH EVERY DAY IN THE MORNING 90 tablet 0  . OVER THE COUNTER MEDICATION Replenex tablets: Take 3 tablet by mouth every morning (joint health)    . Polyvinyl Alcohol-Povidone (REFRESH OP) Place 2 drops into both eyes daily as needed (for dry eyes).     . tamsulosin (FLOMAX) 0.4 MG CAPS capsule TAKE 1 CAPSULE BY MOUTH ONCE DAILY (Patient taking differently: TAKE 0.4 MG BY  MOUTH ONCE DAILY IN THE EVENING) 90 capsule 3  . amLODipine (NORVASC) 5 MG tablet Take 1 tablet (5 mg total) by mouth daily. 90 tablet 3  . guaiFENesin-codeine 100-10 MG/5ML syrup Take 5 mLs by mouth every 6 (six) hours as needed for cough.      No facility-administered medications prior to visit.     Allergies:   Niacin, Niacin and related, Metformin and related, and Statins   Social History   Socioeconomic History  . Marital status: Married    Spouse name: Not on file  . Number of children: 5  . Years of education: Not on file  . Highest education level: Not on file  Occupational History  . Occupation: retired    Fish farm manager: RETIRED  Tobacco Use  . Smoking status: Former Smoker    Packs/day: 2.00    Years: 39.00    Pack years: 78.00    Types: Cigarettes    Quit date: 01/24/1994    Years since quitting: 25.7  . Smokeless tobacco: Never Used  Vaping Use  . Vaping Use: Never used  Substance and Sexual Activity  . Alcohol use: No  . Drug use: No  . Sexual activity: Never  Other Topics Concern  . Not on file  Social History Narrative   On Farm/ in [place]; no alcohol; quit smoking- 96; worked  in Estate agent.    Social Determinants of Health   Financial Resource Strain:   . Difficulty of Paying Living Expenses: Not on file  Food Insecurity:   . Worried About Charity fundraiser in the Last Year: Not on file  . Ran Out of Food in the Last Year: Not on file  Transportation Needs:   . Lack of Transportation (Medical): Not on file  . Lack of Transportation (Non-Medical): Not on file  Physical Activity:   . Days of Exercise per Week: Not on file  . Minutes of Exercise per Session: Not on file  Stress:   . Feeling of Stress : Not on file  Social Connections:   . Frequency of Communication with Friends and Family: Not on file  . Frequency of Social Gatherings with Friends and Family: Not on file  . Attends Religious Services: Not on file  . Active Member of Clubs or Organizations: Not on file  . Attends Archivist Meetings: Not on file  . Marital Status: Not on file     Family History:  The patient's family history includes Arrhythmia in his brother; Cancer in his brother; Heart attack in his brother and father; Parkinsonism in his brother.   ROS:   Please see the history of present illness.    ROS All other systems reviewed and are negative.   PHYSICAL EXAM:   VS:  BP 130/60   Pulse 68   Temp (!) 97.2 F (36.2 C)   Ht 6' (1.829 m)   Wt 268 lb 12.8 oz (121.9 kg)   SpO2 (!) 89%   BMI 36.46 kg/m    GEN: Well nourished, obese, in no acute distress  HEENT: normal  Neck: no JVD, carotid bruits, or masses Cardiac: RRR; gr 1/6 SEM, rubs, or gallops, 1+ pitting edema  +pacemaker present Respiratory:  clear to auscultation bilaterally, normal work of breathing GI: soft, nontender, nondistended, + BS MS: no deformity or atrophy  Skin: warm and dry, no rash Neuro:  Alert and Oriented x 3, Strength and sensation are intact Psych: euthymic mood, full affect  Wt Readings  from Last 3 Encounters:  10/17/19 268 lb 12.8 oz (121.9 kg)  06/13/19 275 lb 6.4 oz  (124.9 kg)  04/16/19 272 lb (123.4 kg)      Studies/Labs Reviewed:   EKG:  EKG is not  ordered today.    Recent Labs: 03/28/2019: BUN 30; Creatinine, Ser 1.49; Potassium 5.3; Sodium 143 04/16/2019: Hemoglobin 9.5; Platelets 309   Lipid Panel    Component Value Date/Time   CHOL 152 08/06/2018 1201   TRIG 107 08/06/2018 1201   HDL 42 08/06/2018 1201   CHOLHDL 3.6 08/06/2018 1201   CHOLHDL 2.3 12/11/2015 0854   VLDL 12 12/11/2015 0854   LDLCALC 89 08/06/2018 1201   Labs dated 07/31/17; Hgb 10.6. Glucose 137. Otherwise CMET normal. Cholesterol 110, triglycerides 74, HDL 49, LDL 46. Labs dated 12/07/18: Hgb 9.9. glucose 127. Otherwise CMET normal. A1c 6.5%. cholesterol 139, triglycerides 96, HDL 42, LDL 78.  Dated 08/02/19: Hgb 8.4. glucose 206, BUN 34 creatinine 1.7. A1c 6.9.     Additional studies/ records that were reviewed today include:   Cath 03/23/2017 Conclusion     Mid RCA lesion is 100% stenosed.  Ost LM to Dist LM lesion is 70% stenosed.  Prox Cx lesion is 90% stenosed.  Ost 2nd Mrg lesion is 100% stenosed.  Prox LAD-1 lesion is 90% stenosed.  Prox LAD-2 lesion is 100% stenosed.  LIMA and is normal in caliber.  The graft exhibits no disease.  SVG graft was visualized by angiography and is normal in caliber.  Left radial artery graft was visualized by angiography.   1.  Severe native three-vessel coronary artery disease with total occlusion of the RCA, severe left main stenosis, total occlusion of the LAD with severe calcification, and severe stenosis of the circumflex and its branch vessels.  2.  Status post aortocoronary bypass surgery with continued patency of the LIMA to LAD, continued patency of the free radial graft to OM with tight stenosis at the distal anastomosis involving a small caliber vessel, and continued patency of the saphenous vein graft to PDA without any significant disease.  3.  Severe aortic valve stenosis with a mean gradient of 40  mmHg.  The calculated valve area is 1.74 cm likely related to high cardiac output  4.  Moderate pulmonary hypertension with a PA pressure of 72/25 with a mean of 38   Recommendations: The patient will undergo permanent pacemaker placement for symptomatic atrial fibrillation with slow ventricular rate.  He will continue to undergo evaluation by the multidisciplinary heart team for consideration of treatment options for his severe aortic stenosis.      Limited echo 05/10/2017 LV EF: 60% -   65% Study Conclusions  - Left ventricle: The cavity size was normal. Systolic function was   normal. The estimated ejection fraction was in the range of 60%   to 65%. Wall motion was normal; there were no regional wall   motion abnormalities. - Ventricular septum: The contour showed diastolic flattening. - Aortic valve: A TAVR bioprosthesis was present. There was no   regurgitation. Peak velocity (S): 207 cm/s. Mean gradient (S): 7   mm Hg. Valve area (VTI): 1.65 cm^2. Valve area (Vmax): 1.44 cm^2.   Valve area (Vmean): 1.77 cm^2. - Mitral valve: Moderately calcified annulus. - Pulmonary arteries: Systolic pressure was moderately to severely   increased. PA peak pressure: 65 mm Hg (S).  Echo 06/14/17:Study Conclusions  - Left ventricle: The cavity size was normal. Wall thickness was   increased in  a pattern of moderate LVH. Indeterminant diastolic   function. The estimated ejection fraction was 55%. Although no   diagnostic regional wall motion abnormality was identified, this   possibility cannot be completely excluded on the basis of this   study. - Aortic valve: Bioprosthetic aortic valve s/p TAVR. There was no   stenosis. There was no regurgitation. Mean gradient (S): 12 mm   Hg. - Mitral valve: Moderately calcified annulus. - Left atrium: The atrium was severely dilated. - Right ventricle: The cavity size was mildly dilated. Pacer wire   or catheter noted in right ventricle.  Systolic function was   normal. - Right atrium: The atrium was mildly dilated. - Tricuspid valve: Peak RV-RA gradient (S): 36 mm Hg. - Pulmonary arteries: PA peak pressure: 39 mm Hg (S). - Inferior vena cava: The vessel was normal in size. The   respirophasic diameter changes were in the normal range (>= 50%),   consistent with normal central venous pressure.  Impressions:  - Normal LV size with moderate LV hypertrophy. EF 55%. Mildly   dilated RV with normal systolic function. Bioprosthetic aortic   valve s/p TAVR, no significant stenosis or regurgitation. Mild   pulmonary hypertension.    Carotid dopplers 08/28/18: Summary: Right Carotid: Velocities in the right ICA are consistent with a 1-39% stenosis.                The RICA velocities remain within normal range and stable                compared to the prior exam.  Left Carotid: Velocities in the left ICA are consistent with a 1-39% stenosis.               The LICA velocities remain within normal range and stable compared               to the prior exam.  Vertebrals:  Left vertebral artery demonstrates antegrade flow. Right vertebral              artery demonstrates high resistant flow. Subclavians: Right subclavian artery was stenotic. Right subclavian artery flow              was disturbed. Normal flow hemodynamics were seen in the left              subclavian artery. Unable to visualize the right innominate.                Technically challenging exam secondary to short neck and neck              girth.  *See table(s) above for measurements and observations.     Electronically signed by Carlyle Dolly MD on 08/30/2018 at 4:28:17 PM.   Echo 08/27/18: IMPRESSIONS    1. The left ventricle has normal systolic function, with an ejection  fraction of 55-60%. The cavity size was normal. There is moderately  increased left ventricular wall thickness. Left ventricular diastolic  function could not be evaluated  secondary to  atrial fibrillation. Elevated mean left atrial pressure.  2. The right ventricle has normal systolic function. The cavity was  moderately enlarged. Right ventricular systolic pressure is mildly  elevated with an estimated pressure of 33.2 mmHg.  3. Left atrial size was severely dilated.  4. Right atrial size was severely dilated.  5. The mitral valve is abnormal. There is moderate mitral annular  calcification present.  6. The tricuspid valve is grossly normal.  7. A  Edwards Sapien bioprosthetic aortic valve (TAVR) valve is present in  the aortic position. Procedure Date: 04/29/2017 Normal aortic valve  prosthesis.  8. The aorta is normal in size and structure.  9. The inferior vena cava was dilated in size with >50% respiratory  variability.  10. Normal LV systolic function; moderate LVH; biatrial enlargement;  moderate RVE; s/p AVR with mean gradient of 17 mmHg and no AI.    ASSESSMENT:    1. Permanent atrial fibrillation ([place])   2. S/p TAVR (transcatheter aortic valve replacement), bioprosthetic   3. Coronary artery disease involving coronary bypass graft of native heart without angina pectoris   4. SOB (shortness of breath)   5. Essential hypertension   6. Pacemaker      PLAN:  In order of problems listed above:  1. CAD s/p CABG: Patient underwent a cardiac catheterization in February 2019, grafts still patent. Continued medical therapy was recommended.  No angina.  2. Severe aortic stenosis s/p TAVR in April 2019: Echo in August 2020 showed good valve function. On Eliquis for Afib. ASA discontinued.   3. History of permanent atrial fibrillation with slow ventricular rate: S/p pacemaker. Now V paced. On Eliquis 5 mg twice daily.  4.   Chronic  diastolic CHF. On lasix and HCTZ. Volume status is improved today.  continue lasix  40 mg daily. Discussed importance of sodium restriction. Will arrange for home oxygen. Sat 89% today at rest.    4. Hypertension: BP well controlled. Will reduce amlodipine to 2.5 mg daily to avoid hypotension.  5. Hyperlipidemia: on Crestor- good control.  6. Carotid artery disease: History of left carotid endarterectomy in 2014.  Stable on last ultrasound August 2020- patent.   7.   S/p right PCA CVA with visual cut.   8. Macrocytic anemia.  Hgb 8.4     Medication Adjustments/Labs and Tests Ordered: Current medicines are reviewed at length with the patient today.  Concerns regarding medicines are outlined above.  Medication changes, Labs and Tests ordered today are listed in the Patient Instructions below. Patient Instructions  Reduce amlodipine 2.5 mg daily  Continue your other therapy    follow up  6 months  Signed, Braelynne Garinger [name], MD  10/17/2019 1:49 PM    [facility] [address], [place], [place]  [address] Phone: [phone]; Fax: [phone]

## 2019-10-17 ENCOUNTER — Ambulatory Visit (INDEPENDENT_AMBULATORY_CARE_PROVIDER_SITE_OTHER): Payer: Medicare Other | Admitting: Cardiology

## 2019-10-17 ENCOUNTER — Encounter: Payer: Self-pay | Admitting: Cardiology

## 2019-10-17 ENCOUNTER — Other Ambulatory Visit: Payer: Self-pay

## 2019-10-17 VITALS — BP 130/60 | HR 68 | Temp 97.2°F | Ht 72.0 in | Wt 268.8 lb

## 2019-10-17 DIAGNOSIS — I6522 Occlusion and stenosis of left carotid artery: Secondary | ICD-10-CM

## 2019-10-17 DIAGNOSIS — Z950 Presence of cardiac pacemaker: Secondary | ICD-10-CM

## 2019-10-17 DIAGNOSIS — Z953 Presence of xenogenic heart valve: Secondary | ICD-10-CM | POA: Diagnosis not present

## 2019-10-17 DIAGNOSIS — I25810 Atherosclerosis of coronary artery bypass graft(s) without angina pectoris: Secondary | ICD-10-CM | POA: Diagnosis not present

## 2019-10-17 DIAGNOSIS — I4821 Permanent atrial fibrillation: Secondary | ICD-10-CM

## 2019-10-17 DIAGNOSIS — R0602 Shortness of breath: Secondary | ICD-10-CM

## 2019-10-17 DIAGNOSIS — I10 Essential (primary) hypertension: Secondary | ICD-10-CM

## 2019-10-17 MED ORDER — OMEPRAZOLE 20 MG PO CPDR
40.0000 mg | DELAYED_RELEASE_CAPSULE | Freq: Every day | ORAL | 11 refills | Status: DC
Start: 2019-10-17 — End: 2019-12-12

## 2019-10-17 MED ORDER — AMLODIPINE BESYLATE 5 MG PO TABS: 2.5000 mg | ORAL_TABLET | Freq: Every day | ORAL | 3 refills | Status: DC

## 2019-10-17 NOTE — Patient Instructions (Signed)
Reduce amlodipine 2.5 mg daily  Continue your other therapy

## 2019-10-24 ENCOUNTER — Telehealth: Payer: Self-pay | Admitting: Cardiology

## 2019-10-24 NOTE — Telephone Encounter (Signed)
Jinny Sanders the pt's wife is calling stating Malachy Mood advised her to call her if they did not hear from or receive oxygen for [facility]. She states they have not received it. Please advise.

## 2019-10-25 NOTE — Telephone Encounter (Signed)
[place] called [phone], left message on Melissa Stenson's personal voice mail patient needs home O2. Called patient's wife left message on her personal voice mail [place] will be calling about home O2.

## 2019-10-29 ENCOUNTER — Other Ambulatory Visit: Payer: Self-pay

## 2019-10-29 ENCOUNTER — Telehealth: Payer: Self-pay | Admitting: Cardiology

## 2019-10-29 DIAGNOSIS — R0602 Shortness of breath: Secondary | ICD-10-CM

## 2019-10-29 DIAGNOSIS — Z953 Presence of xenogenic heart valve: Secondary | ICD-10-CM

## 2019-10-29 DIAGNOSIS — I4821 Permanent atrial fibrillation: Secondary | ICD-10-CM

## 2019-10-29 NOTE — Progress Notes (Signed)
Home

## 2019-10-29 NOTE — Telephone Encounter (Signed)
Patient states that she needs to speak with Malachy Mood in regards to the ingredients in the new vitamins and fruits/veggies that she has been giving her husband. She needs to make sure that they dont interfere with his eliquis.

## 2019-10-29 NOTE — Telephone Encounter (Signed)
Spoke to [employer] with [place] she will evaluate patient for home O2.Order placed.

## 2019-10-30 NOTE — Telephone Encounter (Signed)
Spoke to patient's wife she stated she wanted to make sure ok for husband to take new vitamins veggies and fruits.Spoke to pharmacist  Raquel she advised ok to take with Eliquis.Advised to avoid turmeric.

## 2019-11-05 ENCOUNTER — Telehealth: Payer: Self-pay | Admitting: Cardiology

## 2019-11-05 NOTE — Telephone Encounter (Signed)
New message:    Wife called and said pt never heard from the lady about his oxygen. She says he really need the oxygen.

## 2019-11-05 NOTE — Telephone Encounter (Signed)
Spoke to patient's wife she stated she never heard from [place] with [facility] about home O2. Spoke to [place] with [facility] he will be faxing a O2 order for Dr.Jordan to sign.

## 2019-11-08 NOTE — Telephone Encounter (Signed)
Spoke to [place] overnight oximetry revealed patient does require O2 2L/min at night.O2 order faxed back to [facility] at fax # [phone]. Spoke to patient's wife advised O2 order faxed to [facility], after [facility] receives order O2 will be set up at home tonight.

## 2019-11-08 NOTE — Telephone Encounter (Signed)
Patient's wife calling back to follow up of O2 order.

## 2019-11-11 ENCOUNTER — Telehealth: Payer: Self-pay | Admitting: Cardiology

## 2019-11-11 NOTE — Telephone Encounter (Signed)
Spoke to patient's wife she stated she is concerned about husband.Stated when he walks out on front porch his O2 sat drops to 70's.Stated he is unable to go without O2.Stated she is unable to bring him to office to check resting O2 sat to get him qualified for portable O2 during the day.  Spoke to [employer] with [place].Message faxed to her for a RN skilled home evaluation to check home O2 sat.Fax information to [facility] at fax # [phone] for portable O2 tank at 2L/min during the day.

## 2019-11-11 NOTE — Telephone Encounter (Signed)
Patients wife calling in to request portable oxygen tank.

## 2019-11-12 NOTE — Telephone Encounter (Signed)
Loren from [facility] calling back. She states they need order orders for disease management and/or medication management signed by Dr. [name] and faxed to [phone],

## 2019-11-12 NOTE — Telephone Encounter (Signed)
Returned call to [place] at [facility] regarding orders; advised that Dr. [name] and Malachy Mood are not in the office today. Lauren asked that a community staff message be sent to Evan [name] so that patient can be evaluated today. I sent the message. She asks that paperwork be signed by Dr. [name] as soon as he is back in the office.  She thanked me for the help.

## 2019-11-12 NOTE — Telephone Encounter (Signed)
    Evan [name] is calling back, she said if the order can be sent to [name] so that pt can be seen today. She said it is important to get the order today

## 2019-11-13 ENCOUNTER — Telehealth: Payer: Self-pay | Admitting: Cardiology

## 2019-11-13 NOTE — Telephone Encounter (Signed)
Spoke with Estill Bamberg from [facility]. States the current orders they have for this pt reflect only nighttime oxygen use and would like Dr. [name] to sign for updated orders that reflect portable oxygen use. Gave [facility] nurse fax number. Will get signed and return to [place].

## 2019-11-13 NOTE — Telephone Encounter (Signed)
[place] Nurse with [facility] wantd to speak to Dr. Doug Sou Nurse about an order for portable oxygen.

## 2019-11-13 NOTE — Telephone Encounter (Signed)
Spoke to [employer] with [facility].She stated she received O2 order.Stated patient was evaluated yesterday.She will call back if anything else is needed.

## 2019-11-13 NOTE — Telephone Encounter (Signed)
Evan [name] from [facility] calling in regards to an oxygen order.

## 2019-11-13 NOTE — Telephone Encounter (Signed)
Pt wife called in and stated that [facility] came out for Eval and they should be calling in today .  She just wanted to let Dr [name] Nurse know that the would be calling.  She also wanted to let nurse know that they had planned to go out of town but they are not now because she thinks pt is not able

## 2019-11-15 NOTE — Telephone Encounter (Signed)
Order for portable O2 tank faxed to [facility] at fax # [phone]. Spoke to [employer] with [facility]. Home health RN to evaluate patient every week for the next 4 weeks.Bmet and bnp to be done in 1 week.Fax results to Dr.Jordan at fax # [phone].

## 2019-11-19 ENCOUNTER — Telehealth: Payer: Self-pay | Admitting: Cardiology

## 2019-11-19 NOTE — Telephone Encounter (Signed)
Spoke to patient's wife.Dr.Jordan's advice given.Adora Fridge RN with [facility] left message on personal voice mail Dr.Jordan's advice.Advised patient will need a Bmet on Friday.Advised faxed results to [facility] at fax # [phone].  Message sent to [place] for B/P parameters.Wife wanted to know when B/P is low if ok to hold Hydralazine.

## 2019-11-19 NOTE — Telephone Encounter (Signed)
Yes let's continue lasix 40 mg bid for a week. Will need to keep track of renal function with a BMET later this week or early next. Sodium restriction is crucial. He should weigh daily  Verlan Grotz [name] MD, [facility]

## 2019-11-19 NOTE — Telephone Encounter (Signed)
Patient's home health nurse Luellen Pucker would like to talk you about patient's medications.

## 2019-11-19 NOTE — Telephone Encounter (Signed)
Spoke to [employer] with [place].She is presently out at patient's home.She stated patient is still sob.He is receiving O2 2L/min via nasal cannula continuously.He increased Lasix to 40 mg twice a day for 4 days today is the 4th day.He still has swelling in both lower legs.Patient wants to sleep most of day. Stated she heard crackles in left lower lung.She wants to ask Dr.Jordan if he wants to continue Lasix 40 mg twice a day for a few more days.She also would like B/P parameters.Last night his B/P was low 109/50.She told pt to hold pm dose of Hydralazine.She discussed a better diet,low salt with wife and patient.He has been eating bacon every day.Patient is going to start weighing everyday.Advised to elevate feet when sitting as much as he can.Advised to wear compression socks during the day.I will send message to Dr.Jordan to see if ok to continue Lasix 40 mg twice a day for a few more days and B/P parameters.

## 2019-11-20 ENCOUNTER — Telehealth: Payer: Self-pay

## 2019-11-20 NOTE — Telephone Encounter (Signed)
Evan [name] from [place] returning call to discuss lab results.

## 2019-11-20 NOTE — Telephone Encounter (Signed)
Yes Ok to hold hydralazine if BP too low < 327 systolic  Quinta Eimer [name] MD, [facility]

## 2019-11-20 NOTE — Telephone Encounter (Signed)
Received bmet and bnp from [place].Bun 40,Creatinine 2.02,bnp 7387.Lab to be scanned into patient's chart.Message sent to [place].

## 2019-11-20 NOTE — Telephone Encounter (Signed)
Left message on wife's personal voice mail Dr.Jordan's advice.

## 2019-11-22 NOTE — Telephone Encounter (Signed)
Already spoke to [facility] RN with [place].She will being going out to patient's home today.

## 2019-11-26 ENCOUNTER — Telehealth: Payer: Self-pay | Admitting: Cardiology

## 2019-11-26 NOTE — Telephone Encounter (Signed)
° ° ° °  [facility] from [facility] calling, she would like to speak with Malachy Mood about pt's medications.

## 2019-11-26 NOTE — Telephone Encounter (Signed)
Returned the call to [place]. She stated that she had been talking with Malachy Mood about the patient's Lasix and felt it would be easier to speak with her again.  Message has been routed.

## 2019-11-27 ENCOUNTER — Telehealth: Payer: Self-pay | Admitting: Cardiology

## 2019-11-27 MED ORDER — APIXABAN 5 MG PO TABS: 5.0000 mg | ORAL_TABLET | Freq: Two times a day (BID) | ORAL | 3 refills | Status: DC

## 2019-11-27 MED ORDER — FUROSEMIDE 40 MG PO TABS: 40.0000 mg | ORAL_TABLET | Freq: Every day | ORAL | 3 refills | Status: DC

## 2019-11-27 MED ORDER — AMLODIPINE BESYLATE 2.5 MG PO TABS: 2.5000 mg | ORAL_TABLET | Freq: Every day | ORAL | 3 refills | Status: AC

## 2019-11-27 NOTE — Telephone Encounter (Signed)
Spoke to patient's wife she stated husband fell early this morning.Stated he was getting out of bed and lost balance.He scraped side of right forearm

## 2019-11-27 NOTE — Telephone Encounter (Signed)
New Message:     Please call, concerning his medicine. She also said he had a fall last night

## 2019-11-29 NOTE — Telephone Encounter (Signed)
See previous 11/29/19 telephone note.

## 2019-11-29 NOTE — Telephone Encounter (Signed)
Spoke to [employer] with [place] on Mon 11/1.She was calling to report patient fell during the night.Lost his balance on the way to bathroom.Stated no injury.He scraped rt forearm.Stated she is concerned patient is still sob on 2L of O2 and weak.He continues to have swelling in both lower legs.She was requesting patient to be seen sooner.Advised Dr.Jordan does not have any openings.Advised no extender appointments.Advised see PCP.I will check for cancellations.I will make Dr.Jordan aware when he is in office next week.  Spoke to patient's wife Tues.11/2.Wife stated patient only wants to see Dr.Jordan.Advised he will need to see PCP since Dr.Jordan is out of office.Stated she will call PCP for appointment.Advised I will check for cancellations.

## 2019-12-02 ENCOUNTER — Telehealth: Payer: Self-pay | Admitting: Cardiology

## 2019-12-02 NOTE — Telephone Encounter (Signed)
Called patient's wife left Dr.Jordan's advice on personal voice mail.

## 2019-12-02 NOTE — Telephone Encounter (Signed)
Agree, I would favor the booster since he tolerated the initial shots well. Monitor the bruising. If he has problems with vision or refractory HA may need to evaluate but will probably resolve.  Letonia Stead [name] MD, [facility]

## 2019-12-02 NOTE — Telephone Encounter (Signed)
Spoke with pt's wife, she expresses concerns about black eye that has shown up following a fall last week (11/2). Pt is on eliquis. Also, expresses concern about pt getting COVID-19 booster, concerned that pt may develop myocarditis from booster.  Will provide Dr. [name] with this information. Questioned wife about side effects after initial vaccination, she stated that he had minimal symptoms following those vaccines.  Advised to pt's wife the low risk of symptoms after booster if minimal symptoms with original vaccines.

## 2019-12-02 NOTE — Telephone Encounter (Signed)
New message:      Patient wife calling stating that the patient has a black eye. He fell last week on his right side, but the black is on the left side. Patient wife would like for some one to call her.

## 2019-12-02 NOTE — Telephone Encounter (Signed)
Follow Up:     Pt is returning Alisha's call from this morning 

## 2019-12-02 NOTE — Telephone Encounter (Signed)
Left message to call back  

## 2019-12-04 ENCOUNTER — Telehealth: Payer: Self-pay | Admitting: Cardiology

## 2019-12-04 NOTE — Telephone Encounter (Signed)
[name] is a 79 year old male with past medical history of CAD status [name] CABG 2005, hypertension, hyperlipidemia, carotid artery disease, permanent atrial fibrillation, severe aortic stenosis s/p TAVR, DM 2 and history of pacemaker in 2019.  Patient was last seen by Dr. [name] in September 2021.  He has been having increasing sign of volume overload for the past few months.  His diuretic was initially increased to 80 mg daily however later reduced down to 40 mg daily last week due to worsening renal function with creatinine jumped up to 2.0.  His wife called cardiology office this morning complaining of worsening shortness of breath, leg edema, and 5 pound weight gain since last week.  Patient is very weak and he does not wish to go to the hospital or come to the office.  For the time being I recommended he increase the Lasix to 40 mg twice daily dosing.  She is trying to convince the patient, I have offered to make a extra office slot for the patient for 4:15 PM this Friday with me.  She is aware that if he continued to have worsening shortness of breath at rest, he will need to go to the hospital.  I fear he will ended up in the hospital anyway.  There appears to be some dietary indiscretion as well.   We will consider CBC, basic metabolic panel and repeat echocardiogram on follow-up.

## 2019-12-04 NOTE — Telephone Encounter (Signed)
Pt c/o swelling: STAT is pt has developed SOB within 24 hours  1) How much weight have you gained and in what time span? 5.8 pounds in a week  2) If swelling, where is the swelling located? feet  3) Are you currently taking a fluid pill? Yes, 40mg  lasix, came off of 80mg  lasix last week.   4) Are you currently SOB? Wife states patient is on Oxygen so he is always SOB  5) Do you have a log of your daily weights (if so, list)? 262 last week, 266.6 on 11/09 and today 11/10 he is 267.8  6) Have you gained 3 pounds in a day or 5 pounds in a week? Yes, 5 pounds in a week  7) Have you traveled recently? No   Per wife, home health nurse is coming out today to check to make sure he does not have fluid in his lungs.

## 2019-12-05 NOTE — Telephone Encounter (Signed)
Will need to come in and be assessed. Keep appointment tomorrow.  Bertram Haddix [name] MD, [facility]

## 2019-12-05 NOTE — Telephone Encounter (Signed)
Spoke to patient's wife Dr.Jordan advised to keep appointment tomorrow 12/06/19 with Almyra Deforest PA.

## 2019-12-05 NOTE — Telephone Encounter (Signed)
Evan [name] with [place] is following up regarding weight gain. She states the patient gained a few pounds since last night. She is requesting a call back from [facility] to discuss further.

## 2019-12-05 NOTE — Telephone Encounter (Signed)
Returned call to [place], [facility] RN States wife spoke to [place] yesterday due to weight gain and furosemide was increased to 40 BID.   He took the additional dose yesterday evening and this morning weight was increased at 270 lbs (267 lbs yesterday).  She states she did note crackles in his LLL yesterday but he denied increased SOB yesterday or today.  She was wondering if any other adjustments to medication needs to be made today until his appointment tomorrow with Almyra Deforest PA.    She states she is concerned patient may not come as he fears we will admit him to the hospital.  She states he also had a fall last weekend and noticed his mobility is declining as well.  He did not have an injury from fall, did not hit his head, but wife had to call for help to get him out of the floor.   She states wife is his caretaker and she is exhausted, patient stays awake all night and sleeps all day.     Advised would send message to Dr. [name] and Almyra Deforest PA to review.   Aware if patient has increased SOB then to proceed to ER for evaluation.   Advised to encourage patient to attend appointment tomorrow.     Nurse request we call her back with recommendations, wife is trying to rest today since she was up all night.

## 2019-12-06 ENCOUNTER — Encounter: Payer: Self-pay | Admitting: Physician Assistant

## 2019-12-06 ENCOUNTER — Other Ambulatory Visit: Payer: Self-pay

## 2019-12-06 ENCOUNTER — Ambulatory Visit (INDEPENDENT_AMBULATORY_CARE_PROVIDER_SITE_OTHER): Payer: Medicare Other | Admitting: Physician Assistant

## 2019-12-06 ENCOUNTER — Telehealth: Payer: Self-pay | Admitting: Cardiology

## 2019-12-06 VITALS — BP 108/52 | HR 64 | Ht 72.0 in | Wt 274.8 lb

## 2019-12-06 DIAGNOSIS — I4821 Permanent atrial fibrillation: Secondary | ICD-10-CM

## 2019-12-06 DIAGNOSIS — Z79899 Other long term (current) drug therapy: Secondary | ICD-10-CM

## 2019-12-06 DIAGNOSIS — I10 Essential (primary) hypertension: Secondary | ICD-10-CM

## 2019-12-06 DIAGNOSIS — Z953 Presence of xenogenic heart valve: Secondary | ICD-10-CM

## 2019-12-06 DIAGNOSIS — N179 Acute kidney failure, unspecified: Secondary | ICD-10-CM

## 2019-12-06 DIAGNOSIS — R0602 Shortness of breath: Secondary | ICD-10-CM

## 2019-12-06 DIAGNOSIS — R600 Localized edema: Secondary | ICD-10-CM

## 2019-12-06 DIAGNOSIS — I25810 Atherosclerosis of coronary artery bypass graft(s) without angina pectoris: Secondary | ICD-10-CM

## 2019-12-06 DIAGNOSIS — I6523 Occlusion and stenosis of bilateral carotid arteries: Secondary | ICD-10-CM

## 2019-12-06 DIAGNOSIS — E119 Type 2 diabetes mellitus without complications: Secondary | ICD-10-CM

## 2019-12-06 DIAGNOSIS — E785 Hyperlipidemia, unspecified: Secondary | ICD-10-CM

## 2019-12-06 NOTE — Patient Instructions (Signed)
Medication Instructions:  Continue current medications  *If you need a refill on your cardiac medications before your next appointment, please call your pharmacy*   Lab Work: CBC, BMP and Pro BNP Today BMP and Pro BNP in 1 week  If you have labs (blood work) drawn today and your tests are completely normal, you will receive your results only by: Marland Kitchen MyChart Message (if you have MyChart) OR . A paper copy in the mail If you have any lab test that is abnormal or we need to change your treatment, we will call you to review the results.   Testing/Procedures: None Ordered   Follow-Up: At [facility], you and your health needs are our priority.  As part of our continuing mission to provide you with exceptional heart care, we have created designated Provider Care Teams.  These Care Teams include your primary Cardiologist (physician) and Advanced Practice Providers (APPs -  Physician Assistants and Nurse Practitioners) who all work together to provide you with the care you need, when you need it.  We recommend signing up for the patient portal called "MyChart".  Sign up information is provided on this After Visit Summary.  MyChart is used to connect with patients for Virtual Visits (Telemedicine).  Patients are able to view lab/test results, encounter notes, upcoming appointments, etc.  Non-urgent messages can be sent to your provider as well.   To learn more about what you can do with MyChart, go to [email or web address].    Your next appointment:   Keep appointment with Dr [name] November 23rd @ 4:20 pm

## 2019-12-06 NOTE — Telephone Encounter (Signed)
Called by [facility] re: critical lab, Hgb 6.2. This is down from 9.5 at last check 7 months ago. Attempted to reach pt, but was unable to reach him. Pt is on eliquis. OV note from earlier today is incomplete, so not sure if pt reported bleeding or other signs/sx blood loss. Primary cardiologist and ordering PA notified via inbasket message. Rudean Curt, MD , [facility] 11:05 PM

## 2019-12-06 NOTE — Progress Notes (Signed)
Cardiology Office Note:    Date:  12/07/2019   ID:  Evan [name], DOB [birth year], MRN [id number]  PCP:  Leonel Ramsay, MD  [facility] Cardiologist:  [name] [name], MD  [place] Electrophysiologist:  Will Meredith Leeds, MD   Referring MD: Leonel Ramsay, MD   No chief complaint on file.   History of Present Illness:    Evan [name] is a 79 y.o. male with a hx of CAD s/p CABG 2005, carotid artery disease, hypertension, hyperlipidemia, DM 2, permanent atrial fibrillation, pacemaker and severe AS s/p TAVR.  Cardiac catheterization in 2007 demonstrated patent grafts.  Myoview in 2013 showed no significant abnormality.  He had a left carotid enterectomy in May 2014.  He is intolerant to statins due to severe myalgia and weakness.  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  Anticoagulation was initially held, and he was treated with aspirin before restarting Eliquis later.  Sleep study in October 2017 showed borderline sleep apnea with minimal desaturation.  Echocardiogram in February 2019 showed severe aortic stenosis.  Dr. Curt Bears also recommended permanent pacemaker due to severe conduction disease, this was placed near the end of February with a St Jude MRI compatible dual-chamber pacemaker on 03/15/2017.  He underwent cardiac catheterization on 03/23/2017 which showed severe three-vessel CAD with patent LIMA to LAD, continued patency of free radial graft to OM with tight stenosis at the distal anastomosis involving a small caliber vessel, patent SVG to PDA, severe aortic stenosis.  He was evaluated by Dr. Burt Knack and underwent TAVR with a Berniece Pap THV size 26 mm bioprosthetic valve by Dr. Cyndia Bent in April 2019.  Follow-up echo in May showed good results.  Aspirin was discontinued to allow the patient to stay on Eliquis long-term.  Patient has been seen by oncology service for microcytic anemia.  He also had possible cirrhosis on  previous CT.  Extensive blood work-up was done and was unremarkable.  Bone biopsy was considered however patient declined.  Patient was last seen by Dr. [name] on 10/17/2019 at which time he appears to be euvolemic however O2 saturation was 89%.  Home oxygen was arranged.  Patient called cardiology service on 12/04/2019 at which time he had increased weight gain and shortness of breath.  Patient presents today as a urgent add-on.  After taking 40 mg twice daily of Lasix for the past 2 days, he appears to be near euvolemic level.  He has trace amount of lower extremity edema.  His lung is clear.  I recommended continue on the higher dose of diuretic.  We will obtain basic metabolic panel, CBC and proBNP today.  In 1 week, he will need a repeat basic metabolic panel and proBNP obtained by home health nurse.  He will keep the current follow-up with Dr. [name] on November 23.  If renal function deteriorates, we will cut back the diuretic. He has baseline fatigue and dyspnea, will obtain CBC.   Past Medical History:  Diagnosis Date  . Anemia   . Carotid arterial disease ([place])   . CHF (congestive heart failure) ([place])   . Coronary artery disease   . Hearing loss   . HOH (hard of hearing)   . Hyperlipidemia   . Hypertension   . Nocturia   . Obesity   . Osteoarthritis    "BUE; BLE; right knee" (03/23/2017)  . Peripheral vision loss, bilateral    S/P 07/2015  . Permanent atrial fibrillation (  [place])   . 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 Severe aortic stenosis   . Sinus drainage   . Stroke [facility]) 07/2015   "peripheral vision still bad out of left eye" (03/23/2017)  . Torn rotator cuff 2012   Right arm  . Type II diabetes mellitus ([place])   . Urinary frequency     Past Surgical History:  Procedure Laterality Date  . BACK SURGERY    . CARDIAC CATHETERIZATION  11/29/2005   SEVERE THREE VESSEL OBSTRUCTIVE ATHERSCLEROTIC CAD. ALL GRAFTS WERE PATENT. EF 65%  . CARDIAC  CATHETERIZATION  03/23/2017  . CAROTID ENDARTERECTOMY Right 01-29-08   cea  . CARPAL TUNNEL RELEASE Right   . CORONARY ARTERY BYPASS GRAFT  03/2003   X3. LIMA GRAFT TO THE LAD, SAPHENOUS VEIN GRAFT TO THE PA, AND A LEFT RADIAL GRAFT TO THEOBTUSE MARGINAL VESSEL  . ENDARTERECTOMY Left 05/28/2012   Procedure: ENDARTERECTOMY CAROTID;  Surgeon: Elam Dutch, MD;  Location: [place];  Service: Vascular;  Laterality: Left;  . INSERT / REPLACE / REMOVE PACEMAKER  03/23/2017  . [place]; 1984  . MOLE SURGERY     "? face/arms"  . PACEMAKER IMPLANT N/A 03/23/2017   Procedure: PACEMAKER IMPLANT;  Surgeon: Constance Haw, MD;  Location: [place] [facility];  Service: Cardiovascular;  Laterality: N/A;  . PATCH ANGIOPLASTY Left 05/28/2012   Procedure: WITH dACRON PATCH ANGIOPLASTY;  Surgeon: Elam Dutch, MD;  Location: [place];  Service: Vascular;  Laterality: Left;  . RIGHT/LEFT HEART CATH AND CORONARY/GRAFT ANGIOGRAPHY N/A 03/23/2017   Procedure: RIGHT/LEFT HEART CATH AND CORONARY/GRAFT ANGIOGRAPHY;  Surgeon: Sherren Mocha, MD;  Location: [place] [facility];  Service: Cardiovascular;  Laterality: N/A;  . TEE WITHOUT CARDIOVERSION N/A 05/09/2017   Procedure: TRANSESOPHAGEAL ECHOCARDIOGRAM (TEE);  Surgeon: Sherren Mocha, MD;  Location: [place];  Service: Open Heart Surgery;  Laterality: N/A;  . TRANSCATHETER AORTIC VALVE REPLACEMENT, TRANSFEMORAL N/A 05/09/2017   Procedure: TRANSCATHETER AORTIC VALVE REPLACEMENT, TRANSFEMORAL using an Edwards 69mm Sapien 3 Aortic Valve;  Surgeon: Sherren Mocha, MD;  Location: [place];  Service: Open Heart Surgery;  Laterality: N/A;    Current Medications: Current Meds  Medication Sig  . acetaminophen (TYLENOL) 650 MG CR tablet Take 1,300 mg by mouth every 8 (eight) hours as needed for pain.   Marland Kitchen amLODipine (NORVASC) 2.5 MG tablet Take 1 tablet (2.5 mg total) by mouth daily.  Marland Kitchen apixaban (ELIQUIS) 5 MG TABS tablet Take 1 tablet (5 mg total) by mouth  every 12 (twelve) hours.  . benazepril (LOTENSIN) 40 MG tablet TAKE 1 TABLET BY MOUTH EVERY DAY  . cyanocobalamin 1000 MCG tablet Take by mouth.  . ezetimibe (ZETIA) 10 MG tablet TAKE 1 TABLET BY MOUTH EVERY DAY  . fluticasone (FLONASE) 50 MCG/ACT nasal spray Place 2 sprays into both nostrils daily.  . furosemide (LASIX) 40 MG tablet Take 40 mg by mouth 2 (two) times daily.  Marland Kitchen glimepiride (AMARYL) 2 MG tablet TAKE 1 TABLET (2 MG TOTAL) BY MOUTH DAILY WITH BREAKFAST  . hydrALAZINE (APRESOLINE) 25 MG tablet Take 1 tablet (25 mg total) by mouth 2 (two) times daily.  . hydrochlorothiazide (HYDRODIURIL) 25 MG tablet TAKE 1 TABLET BY MOUTH EVERY DAY IN THE MORNING  . omeprazole (PRILOSEC) 20 MG capsule Take 2 capsules (40 mg total) by mouth daily.  Marland Kitchen OVER THE COUNTER MEDICATION Replenex tablets: Take 3 tablet by mouth every morning (joint health)  . Polyvinyl Alcohol-Povidone (REFRESH OP)  Place 2 drops into both eyes daily as needed (for dry eyes).   . tamsulosin (FLOMAX) 0.4 MG CAPS capsule TAKE 1 CAPSULE BY MOUTH ONCE DAILY (Patient taking differently: TAKE 0.4 MG BY MOUTH ONCE DAILY IN THE EVENING)     Allergies:   Niacin, Niacin and related, Metformin and related, and Statins   Social History   Socioeconomic History  . Marital status: Married    Spouse name: Not on file  . Number of children: 5  . Years of education: Not on file  . Highest education level: Not on file  Occupational History  . Occupation: retired    Fish farm manager: RETIRED  Tobacco Use  . Smoking status: Former Smoker    Packs/day: 2.00    Years: 39.00    Pack years: 78.00    Types: Cigarettes    Quit date: 01/24/1994    Years since quitting: 25.8  . Smokeless tobacco: Never Used  Vaping Use  . Vaping Use: Never used  Substance and Sexual Activity  . Alcohol use: No  . Drug use: No  . Sexual activity: Never  Other Topics Concern  . Not on file  Social History Narrative   On Farm/ in [place]; no alcohol; quit  smoking- 96; worked in Estate agent.    Social Determinants of Health   Financial Resource Strain:   . Difficulty of Paying Living Expenses: Not on file  Food Insecurity:   . Worried About Charity fundraiser in the Last Year: Not on file  . Ran Out of Food in the Last Year: Not on file  Transportation Needs:   . Lack of Transportation (Medical): Not on file  . Lack of Transportation (Non-Medical): Not on file  Physical Activity:   . Days of Exercise per Week: Not on file  . Minutes of Exercise per Session: Not on file  Stress:   . Feeling of Stress : Not on file  Social Connections:   . Frequency of Communication with Friends and Family: Not on file  . Frequency of Social Gatherings with Friends and Family: Not on file  . Attends Religious Services: Not on file  . Active Member of Clubs or Organizations: Not on file  . Attends Archivist Meetings: Not on file  . Marital Status: Not on file     Family History: The patient's family history includes Arrhythmia in his brother; Cancer in his brother; Heart attack in his brother and father; Parkinsonism in his brother.  ROS:   Please see the history of present illness.     All other systems reviewed and are negative.  EKGs/Labs/Other Studies Reviewed:    The following studies were reviewed today:  Echo 08/27/2018 1. The left ventricle has normal systolic function, with an ejection  fraction of 55-60%. The cavity size was normal. There is moderately  increased left ventricular wall thickness. Left ventricular diastolic  function could not be evaluated secondary to  atrial fibrillation. Elevated mean left atrial pressure.  2. The right ventricle has normal systolic function. The cavity was  moderately enlarged. Right ventricular systolic pressure is mildly  elevated with an estimated pressure of 33.2 mmHg.  3. Left atrial size was severely dilated.  4. Right atrial size was severely dilated.  5. The mitral  valve is abnormal. There is moderate mitral annular  calcification present.  6. The tricuspid valve is grossly normal.  7. A Edwards Sapien bioprosthetic aortic valve (TAVR) valve is present in  the aortic  position. Procedure Date: 04/29/2017 Normal aortic valve  prosthesis.  8. The aorta is normal in size and structure.  9. The inferior vena cava was dilated in size with >50% respiratory  variability.  10. Normal LV systolic function; moderate LVH; biatrial enlargement;  moderate RVE; s/p AVR with mean gradient of 17 mmHg and no AI.   EKG:  EKG is ordered today.  The ekg ordered today demonstrates ventricularly paced rhythm  Recent Labs: 12/06/2019: BUN 54; Creatinine, Ser 2.32; Hemoglobin 6.2; Platelets 287; Potassium 4.4; Sodium 141  Recent Lipid Panel    Component Value Date/Time   CHOL 152 08/06/2018 1201   TRIG 107 08/06/2018 1201   HDL 42 08/06/2018 1201   CHOLHDL 3.6 08/06/2018 1201   CHOLHDL 2.3 12/11/2015 0854   VLDL 12 12/11/2015 0854   LDLCALC 89 08/06/2018 1201     Risk Assessment/Calculations:     CHA2DS2-VASc Score = 6  This indicates a 9.7% annual risk of stroke. The patient's score is based upon: CHF History: 1 HTN History: 1 Diabetes History: 1 Stroke History: 0 Vascular Disease History: 1 Age Score: 2 Gender Score: 0      Physical Exam:    VS:  BP (!) 108/52   Pulse 64   Ht 6' (1.829 m)   Wt 274 lb 12.8 oz (124.6 kg)   SpO2 100%   BMI 37.27 kg/m     Wt Readings from Last 3 Encounters:  12/06/19 274 lb 12.8 oz (124.6 kg)  10/17/19 268 lb 12.8 oz (121.9 kg)  06/13/19 275 lb 6.4 oz (124.9 kg)     GEN:  Frail, chronically ill appearing HEENT: Normal NECK: No JVD; No carotid bruits LYMPHATICS: No lymphadenopathy CARDIAC: Irregularly irregular, no murmurs, rubs, gallops RESPIRATORY:  Clear to auscultation without rales, wheezing or rhonchi  ABDOMEN: Soft, non-tender, non-distended MUSCULOSKELETAL:  No edema; No deformity  SKIN: Warm  and dry NEUROLOGIC:  Alert and oriented x 3 PSYCHIATRIC:  Normal affect   ASSESSMENT:    1. SOB (shortness of breath)   2. Medication management   3. Coronary artery disease involving coronary bypass graft of native heart without angina pectoris   4. Bilateral carotid artery stenosis   5. Essential hypertension   6. Hyperlipidemia LDL goal <70   7. Controlled type 2 diabetes mellitus without complication, without long-term current use of insulin ([place])   8. Permanent atrial fibrillation ([place])   9. S/p TAVR (transcatheter aortic valve replacement), bioprosthetic   10. Leg edema   11. AKI (acute kidney injury) ([place])    PLAN:    In order of problems listed above:  1. Chronic respiratory failure: He has been having increasing dyspnea recently.  Will obtain proBNP, CBC and basic metabolic panel.  We increased his diuretic from the previous 40 mg daily to 40 mg twice daily on 12/04/2019, he appears to be near euvolemic level.  We will need to make sure he is currently not dehydrated.  If renal function stable, we will obtain a repeat proBNP and basic metabolic panel in 1 week.  2. CAD s/p CABG: Denies any recent chest pain  3. Carotid artery disease: Mild disease bilaterally on last carotid Doppler on 08/28/2018.  He has known right subclavian artery stenosis.  4. Hypertension: Blood pressure borderline today  5. Hyperlipidemia: Intolerant of statins  6. DM2: Managed by primary care provider  7. Permanent atrial fibrillation: On Eliquis.  Obtain CBC  8. History of TAVR: Stable on last echocardiogram.  He likely  will need a repeat echocardiogram on the next follow-up  9. Leg edema: Diuretic was increased to 40 mg twice daily from the previous 40 mg daily for the past 2 days.  We will need to obtain basic metabolic panel to make sure there is no worsening renal function.    10. AKI: Recent lab work obtained on 11/19/2019 suggested worsening renal function with creatinine trending up to  2.0.  Since he was diuresed further for the past 2 days, we will obtain basic metabolic panel today to make sure her renal function is not worsening.  If it is worsening, we will need to decrease Lasix back down to 40 mg daily.  He will also need a repeat basic metabolic panel and proBNP in 1 week.    Shared Decision Making/Informed Consent        Medication Adjustments/Labs and Tests Ordered: Current medicines are reviewed at length with the patient today.  Concerns regarding medicines are outlined above.  Orders Placed This Encounter  Procedures  . CBC  . Basic Metabolic Panel (BMET)  . Pro b natriuretic peptide (BNP)[facility])  . Basic Metabolic Panel (BMET)  . Pro b natriuretic peptide (BNP)[facility] [facility])   No orders of the defined types were placed in this encounter.   Patient Instructions  Medication Instructions:  Continue current medications  *If you need a refill on your cardiac medications before your next appointment, please call your pharmacy*   Lab Work: CBC, BMP and Pro BNP Today BMP and Pro BNP in 1 week  If you have labs (blood work) drawn today and your tests are completely normal, you will receive your results only by: Marland Kitchen MyChart Message (if you have MyChart) OR . A paper copy in the mail If you have any lab test that is abnormal or we need to change your treatment, we will call you to review the results.   Testing/Procedures: None Ordered   Follow-Up: At [facility], you and your health needs are our priority.  As part of our continuing mission to provide you with exceptional heart care, we have created designated Provider Care Teams.  These Care Teams include your primary Cardiologist (physician) and Advanced Practice Providers (APPs -  Physician Assistants and Nurse Practitioners) who all work together to provide you with the care you need, when you need it.  We recommend signing up for the patient portal  called "MyChart".  Sign up information is provided on this After Visit Summary.  MyChart is used to connect with patients for Virtual Visits (Telemedicine).  Patients are able to view lab/test results, encounter notes, upcoming appointments, etc.  Non-urgent messages can be sent to your provider as well.   To learn more about what you can do with MyChart, go to [email or web address].    Your next appointment:   Keep appointment with Dr [name] November 23rd @ 4:20 pm     Signed, Almyra Deforest, [place]  12/07/2019 12:53 AM    [place]

## 2019-12-07 ENCOUNTER — Emergency Department (HOSPITAL_COMMUNITY): Payer: Medicare Other

## 2019-12-07 ENCOUNTER — Inpatient Hospital Stay (HOSPITAL_COMMUNITY): Payer: Medicare Other

## 2019-12-07 ENCOUNTER — Encounter (HOSPITAL_COMMUNITY): Payer: Self-pay | Admitting: Student

## 2019-12-07 ENCOUNTER — Inpatient Hospital Stay (HOSPITAL_COMMUNITY)
Admission: EM | Admit: 2019-12-07 | Discharge: 2019-12-12 | DRG: 811 | Disposition: A | Discharge status: Home Health | Payer: Medicare Other | Attending: Internal Medicine | Admitting: Internal Medicine

## 2019-12-07 ENCOUNTER — Other Ambulatory Visit: Payer: Self-pay

## 2019-12-07 ENCOUNTER — Telehealth: Payer: Self-pay | Admitting: Physician Assistant

## 2019-12-07 DIAGNOSIS — D120 Benign neoplasm of cecum: Secondary | ICD-10-CM | POA: Diagnosis present

## 2019-12-07 DIAGNOSIS — E669 Obesity, unspecified: Secondary | ICD-10-CM | POA: Diagnosis present

## 2019-12-07 DIAGNOSIS — B3781 Candidal esophagitis: Secondary | ICD-10-CM | POA: Diagnosis present

## 2019-12-07 DIAGNOSIS — Z7901 Long term (current) use of anticoagulants: Secondary | ICD-10-CM | POA: Diagnosis not present

## 2019-12-07 DIAGNOSIS — N179 Acute kidney failure, unspecified: Secondary | ICD-10-CM | POA: Diagnosis not present

## 2019-12-07 DIAGNOSIS — Z8249 Family history of ischemic heart disease and other diseases of the circulatory system: Secondary | ICD-10-CM

## 2019-12-07 DIAGNOSIS — M1711 Unilateral primary osteoarthritis, right knee: Secondary | ICD-10-CM | POA: Diagnosis present

## 2019-12-07 DIAGNOSIS — Z951 Presence of aortocoronary bypass graft: Secondary | ICD-10-CM

## 2019-12-07 DIAGNOSIS — Z79899 Other long term (current) drug therapy: Secondary | ICD-10-CM

## 2019-12-07 DIAGNOSIS — I4821 Permanent atrial fibrillation: Secondary | ICD-10-CM | POA: Diagnosis present

## 2019-12-07 DIAGNOSIS — Z950 Presence of cardiac pacemaker: Secondary | ICD-10-CM | POA: Diagnosis not present

## 2019-12-07 DIAGNOSIS — I5033 Acute on chronic diastolic (congestive) heart failure: Secondary | ICD-10-CM | POA: Diagnosis not present

## 2019-12-07 DIAGNOSIS — I5043 Acute on chronic combined systolic (congestive) and diastolic (congestive) heart failure: Secondary | ICD-10-CM | POA: Diagnosis present

## 2019-12-07 DIAGNOSIS — I2510 Atherosclerotic heart disease of native coronary artery without angina pectoris: Secondary | ICD-10-CM | POA: Diagnosis not present

## 2019-12-07 DIAGNOSIS — I2584 Coronary atherosclerosis due to calcified coronary lesion: Secondary | ICD-10-CM | POA: Diagnosis present

## 2019-12-07 DIAGNOSIS — K635 Polyp of colon: Secondary | ICD-10-CM | POA: Diagnosis present

## 2019-12-07 DIAGNOSIS — D7589 Other specified diseases of blood and blood-forming organs: Secondary | ICD-10-CM | POA: Diagnosis present

## 2019-12-07 DIAGNOSIS — E1122 Type 2 diabetes mellitus with diabetic chronic kidney disease: Secondary | ICD-10-CM | POA: Diagnosis present

## 2019-12-07 DIAGNOSIS — Z8673 Personal history of transient ischemic attack (TIA), and cerebral infarction without residual deficits: Secondary | ICD-10-CM

## 2019-12-07 DIAGNOSIS — K7460 Unspecified cirrhosis of liver: Secondary | ICD-10-CM | POA: Diagnosis present

## 2019-12-07 DIAGNOSIS — I5042 Chronic combined systolic (congestive) and diastolic (congestive) heart failure: Secondary | ICD-10-CM | POA: Diagnosis not present

## 2019-12-07 DIAGNOSIS — R0602 Shortness of breath: Secondary | ICD-10-CM | POA: Diagnosis not present

## 2019-12-07 DIAGNOSIS — Z953 Presence of xenogenic heart valve: Secondary | ICD-10-CM | POA: Diagnosis not present

## 2019-12-07 DIAGNOSIS — I2721 Secondary pulmonary arterial hypertension: Secondary | ICD-10-CM | POA: Diagnosis present

## 2019-12-07 DIAGNOSIS — I4819 Other persistent atrial fibrillation: Secondary | ICD-10-CM | POA: Diagnosis not present

## 2019-12-07 DIAGNOSIS — Z820 Family history of epilepsy and other diseases of the nervous system: Secondary | ICD-10-CM

## 2019-12-07 DIAGNOSIS — D649 Anemia, unspecified: Secondary | ICD-10-CM | POA: Diagnosis not present

## 2019-12-07 DIAGNOSIS — D509 Iron deficiency anemia, unspecified: Secondary | ICD-10-CM | POA: Diagnosis present

## 2019-12-07 DIAGNOSIS — N183 Chronic kidney disease, stage 3 unspecified: Secondary | ICD-10-CM | POA: Diagnosis present

## 2019-12-07 DIAGNOSIS — Z9981 Dependence on supplemental oxygen: Secondary | ICD-10-CM | POA: Diagnosis not present

## 2019-12-07 DIAGNOSIS — I2582 Chronic total occlusion of coronary artery: Secondary | ICD-10-CM | POA: Diagnosis present

## 2019-12-07 DIAGNOSIS — R600 Localized edema: Secondary | ICD-10-CM | POA: Diagnosis not present

## 2019-12-07 DIAGNOSIS — D46C Myelodysplastic syndrome with isolated del(5q) chromosomal abnormality: Principal | ICD-10-CM | POA: Diagnosis present

## 2019-12-07 DIAGNOSIS — J9611 Chronic respiratory failure with hypoxia: Secondary | ICD-10-CM | POA: Diagnosis present

## 2019-12-07 DIAGNOSIS — E119 Type 2 diabetes mellitus without complications: Secondary | ICD-10-CM

## 2019-12-07 DIAGNOSIS — I130 Hypertensive heart and chronic kidney disease with heart failure and stage 1 through stage 4 chronic kidney disease, or unspecified chronic kidney disease: Secondary | ICD-10-CM | POA: Diagnosis present

## 2019-12-07 DIAGNOSIS — D539 Nutritional anemia, unspecified: Secondary | ICD-10-CM | POA: Diagnosis not present

## 2019-12-07 DIAGNOSIS — D469 Myelodysplastic syndrome, unspecified: Principal | ICD-10-CM | POA: Diagnosis present

## 2019-12-07 DIAGNOSIS — D123 Benign neoplasm of transverse colon: Secondary | ICD-10-CM | POA: Diagnosis present

## 2019-12-07 DIAGNOSIS — K5730 Diverticulosis of large intestine without perforation or abscess without bleeding: Secondary | ICD-10-CM | POA: Diagnosis present

## 2019-12-07 DIAGNOSIS — D124 Benign neoplasm of descending colon: Secondary | ICD-10-CM | POA: Diagnosis present

## 2019-12-07 DIAGNOSIS — N1832 Chronic kidney disease, stage 3b: Secondary | ICD-10-CM | POA: Diagnosis present

## 2019-12-07 DIAGNOSIS — D125 Benign neoplasm of sigmoid colon: Secondary | ICD-10-CM | POA: Diagnosis present

## 2019-12-07 DIAGNOSIS — I5041 Acute combined systolic (congestive) and diastolic (congestive) heart failure: Secondary | ICD-10-CM | POA: Diagnosis not present

## 2019-12-07 DIAGNOSIS — I480 Paroxysmal atrial fibrillation: Secondary | ICD-10-CM | POA: Diagnosis not present

## 2019-12-07 DIAGNOSIS — D630 Anemia in neoplastic disease: Secondary | ICD-10-CM | POA: Diagnosis present

## 2019-12-07 DIAGNOSIS — E785 Hyperlipidemia, unspecified: Secondary | ICD-10-CM | POA: Diagnosis present

## 2019-12-07 DIAGNOSIS — Z888 Allergy status to other drugs, medicaments and biological substances status: Secondary | ICD-10-CM | POA: Diagnosis not present

## 2019-12-07 DIAGNOSIS — D631 Anemia in chronic kidney disease: Secondary | ICD-10-CM | POA: Diagnosis present

## 2019-12-07 DIAGNOSIS — Z952 Presence of prosthetic heart valve: Secondary | ICD-10-CM

## 2019-12-07 DIAGNOSIS — Z87891 Personal history of nicotine dependence: Secondary | ICD-10-CM

## 2019-12-07 DIAGNOSIS — Z20822 Contact with and (suspected) exposure to covid-19: Secondary | ICD-10-CM | POA: Diagnosis present

## 2019-12-07 DIAGNOSIS — Z6837 Body mass index (BMI) 37.0-37.9, adult: Secondary | ICD-10-CM

## 2019-12-07 DIAGNOSIS — E861 Hypovolemia: Secondary | ICD-10-CM | POA: Diagnosis present

## 2019-12-07 DIAGNOSIS — K640 First degree hemorrhoids: Secondary | ICD-10-CM | POA: Diagnosis present

## 2019-12-07 LAB — CBC
HCT: 21.5 % — ABNORMAL LOW (ref 39.0–52.0)
HCT: 24.5 % — ABNORMAL LOW (ref 39.0–52.0)
Hematocrit: 19.6 % — ABNORMAL LOW (ref 37.5–51.0)
Hemoglobin: 6.2 g/dL — CL (ref 13.0–17.7)
Hemoglobin: 6.4 g/dL — CL (ref 13.0–17.0)
Hemoglobin: 7.3 g/dL — ABNORMAL LOW (ref 13.0–17.0)
MCH: 33.7 pg — ABNORMAL HIGH (ref 26.6–33.0)
MCH: 34.2 pg — ABNORMAL HIGH (ref 26.0–34.0)
MCH: 33.2 pg (ref 26.0–34.0)
MCHC: 31.6 g/dL (ref 31.5–35.7)
MCHC: 29.8 g/dL — ABNORMAL LOW (ref 30.0–36.0)
MCHC: 29.8 g/dL — ABNORMAL LOW (ref 30.0–36.0)
MCV: 107 fL — ABNORMAL HIGH (ref 79–97)
MCV: 115 fL — ABNORMAL HIGH (ref 80.0–100.0)
MCV: 111.4 fL — ABNORMAL HIGH (ref 80.0–100.0)
Platelets: 287 10*3/uL (ref 150–450)
Platelets: 265 10*3/uL (ref 150–400)
Platelets: 245 10*3/uL (ref 150–400)
RBC: 1.84 x10E6/uL — CL (ref 4.14–5.80)
RBC: 1.87 MIL/uL — ABNORMAL LOW (ref 4.22–5.81)
RBC: 2.2 MIL/uL — ABNORMAL LOW (ref 4.22–5.81)
RDW: 16.9 % — ABNORMAL HIGH (ref 11.6–15.4)
RDW: 18.7 % — ABNORMAL HIGH (ref 11.5–15.5)
RDW: 21.4 % — ABNORMAL HIGH (ref 11.5–15.5)
WBC: 5.5 10*3/uL (ref 3.4–10.8)
WBC: 5.7 10*3/uL (ref 4.0–10.5)
WBC: 5.6 10*3/uL (ref 4.0–10.5)
nRBC: 0 % (ref 0.0–0.2)
nRBC: 0 % (ref 0.0–0.2)

## 2019-12-07 LAB — HEPATIC FUNCTION PANEL
ALT: 17 U/L (ref 0–44)
AST: 15 U/L (ref 15–41)
Albumin: 3.8 g/dL (ref 3.5–5.0)
Alkaline Phosphatase: 127 U/L — ABNORMAL HIGH (ref 38–126)
Bilirubin, Direct: 0.7 mg/dL — ABNORMAL HIGH (ref 0.0–0.2)
Indirect Bilirubin: 1.6 mg/dL — ABNORMAL HIGH (ref 0.3–0.9)
Total Bilirubin: 2.3 mg/dL — ABNORMAL HIGH (ref 0.3–1.2)
Total Protein: 6.6 g/dL (ref 6.5–8.1)

## 2019-12-07 LAB — POC OCCULT BLOOD, ED: Fecal Occult Bld: NEGATIVE

## 2019-12-07 LAB — BASIC METABOLIC PANEL
Anion gap: 12 (ref 5–15)
BUN/Creatinine Ratio: 23 (ref 10–24)
BUN: 54 mg/dL — ABNORMAL HIGH (ref 8–27)
BUN: 57 mg/dL — ABNORMAL HIGH (ref 8–23)
CO2: 28 mmol/L (ref 20–29)
CO2: 28 mmol/L (ref 22–32)
Calcium: 9 mg/dL (ref 8.6–10.2)
Calcium: 9 mg/dL (ref 8.9–10.3)
Chloride: 98 mmol/L (ref 96–106)
Chloride: 99 mmol/L (ref 98–111)
Creatinine, Ser: 2.32 mg/dL — ABNORMAL HIGH (ref 0.76–1.27)
Creatinine, Ser: 2.48 mg/dL — ABNORMAL HIGH (ref 0.61–1.24)
GFR calc Af Amer: 30 mL/min/{1.73_m2} — ABNORMAL LOW (ref >59)
GFR calc non Af Amer: 26 mL/min/{1.73_m2} — ABNORMAL LOW (ref >59)
GFR, Estimated: 26 mL/min — ABNORMAL LOW (ref >60)
Glucose, Bld: 196 mg/dL — ABNORMAL HIGH (ref 70–99)
Glucose: 155 mg/dL — ABNORMAL HIGH (ref 65–99)
Potassium: 4.4 mmol/L (ref 3.5–5.2)
Potassium: 3.9 mmol/L (ref 3.5–5.1)
Sodium: 141 mmol/L (ref 134–144)
Sodium: 139 mmol/L (ref 135–145)

## 2019-12-07 LAB — IRON AND TIBC
Iron: 251 ug/dL — ABNORMAL HIGH (ref 45–182)
Saturation Ratios: 63 % — ABNORMAL HIGH (ref 17.9–39.5)
TIBC: 398 ug/dL (ref 250–450)
UIBC: 147 ug/dL

## 2019-12-07 LAB — CBG MONITORING, ED: Glucose-Capillary: 107 mg/dL — ABNORMAL HIGH (ref 70–99)

## 2019-12-07 LAB — PRO B NATRIURETIC PEPTIDE: NT-Pro BNP: 9423 pg/mL — ABNORMAL HIGH (ref 0–486)

## 2019-12-07 LAB — PREPARE RBC (CROSSMATCH)

## 2019-12-07 LAB — SAVE SMEAR(SSMR), FOR PROVIDER SLIDE REVIEW

## 2019-12-07 LAB — HEMOGLOBIN A1C
Hgb A1c MFr Bld: 6.6 % — ABNORMAL HIGH (ref 4.8–5.6)
Mean Plasma Glucose: 142.72 mg/dL

## 2019-12-07 LAB — RESPIRATORY PANEL BY RT PCR (FLU A&B, COVID)
Influenza A by PCR: NEGATIVE
Influenza B by PCR: NEGATIVE
SARS Coronavirus 2 by RT PCR: NEGATIVE

## 2019-12-07 LAB — RETICULOCYTES
Immature Retic Fract: 22.2 % — ABNORMAL HIGH (ref 2.3–15.9)
RBC.: 1.89 MIL/uL — ABNORMAL LOW (ref 4.22–5.81)
Retic Count, Absolute: 42.5 10*3/uL (ref 19.0–186.0)
Retic Ct Pct: 2.3 % (ref 0.4–3.1)

## 2019-12-07 LAB — LACTATE DEHYDROGENASE: LDH: 190 U/L (ref 98–192)

## 2019-12-07 LAB — FOLATE: Folate: 11.8 ng/mL (ref >5.9)

## 2019-12-07 LAB — VITAMIN B12: Vitamin B-12: 1433 pg/mL — ABNORMAL HIGH (ref 180–914)

## 2019-12-07 LAB — TECHNOLOGIST SMEAR REVIEW

## 2019-12-07 LAB — FERRITIN: Ferritin: 71 ng/mL (ref 24–336)

## 2019-12-07 LAB — GLUCOSE, CAPILLARY: Glucose-Capillary: 124 mg/dL — ABNORMAL HIGH (ref 70–99)

## 2019-12-07 IMAGING — CT CT HEART MORP W/ CTA COR W/ SCORE W/ CA W/CM &/OR W/O CM
1 of 5 series · 14 of 20 positions shown, 18 images · non-contrast
Comparison: None.

CLINICAL DATA: Aortic stenosis

EXAM:
Cardiac TAVR CT
TECHNIQUE: The patient was scanned on a Siemens Force scanner. A 120 kV
retrospective scan was triggered in the descending thoracic aorta at
111 HU's. Gantry rotation speed was 270 msecs and collimation was .9
mm. No beta blockade or nitro were given. The 3D data set was
reconstructed in 5% intervals of the R-R cycle. Systolic and
diastolic phases were analyzed on a dedicated work station using
MPR, MIP and VRT modes. The patient received 80 cc of contrast.

[Series 5: 0-90% · axial · 0.43mm/px · z∈[+1255,+1495]mm · 14 of 4610 slices shown, 18 images]
[im 308/4610  vessel]
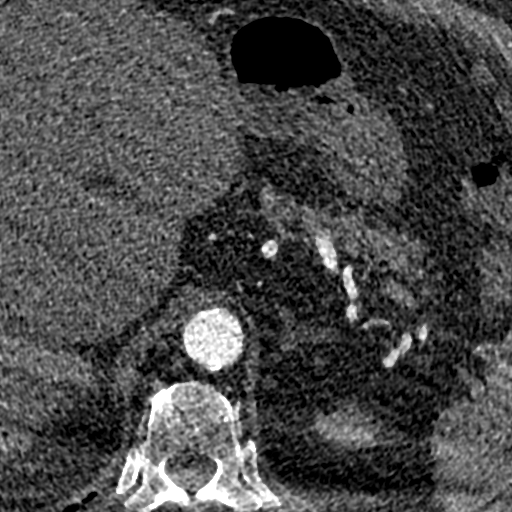
[im 308/4610  lung]
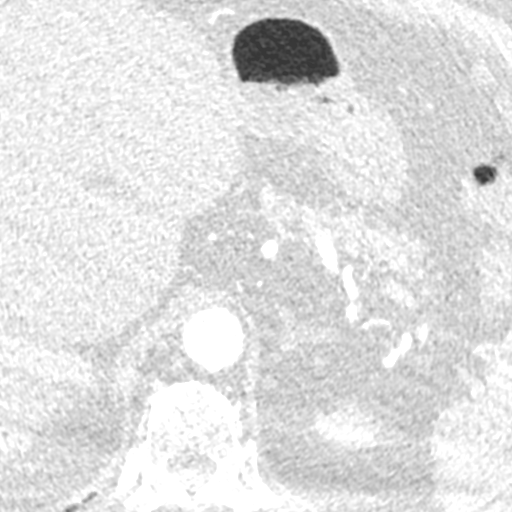
[im 615/4610  vessel]
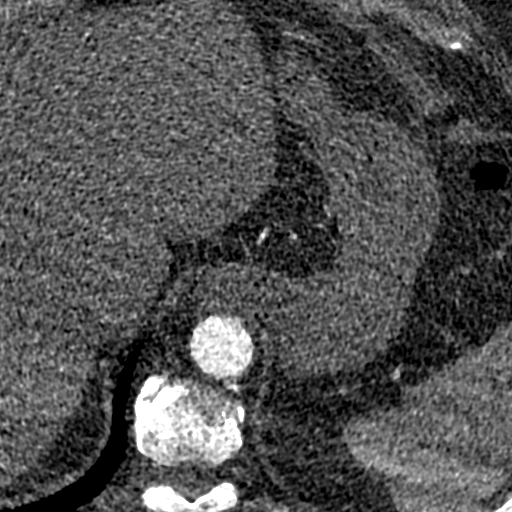
[im 922/4610  vessel]
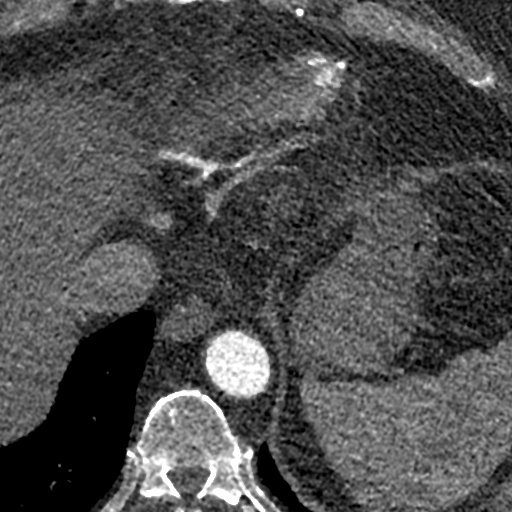
[im 1230/4610  vessel]
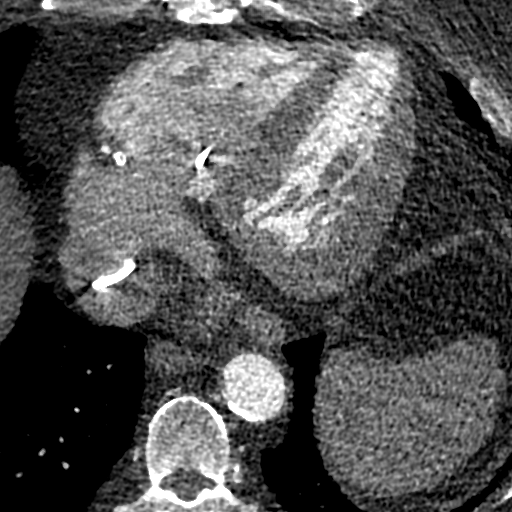
[im 1537/4610  vessel]
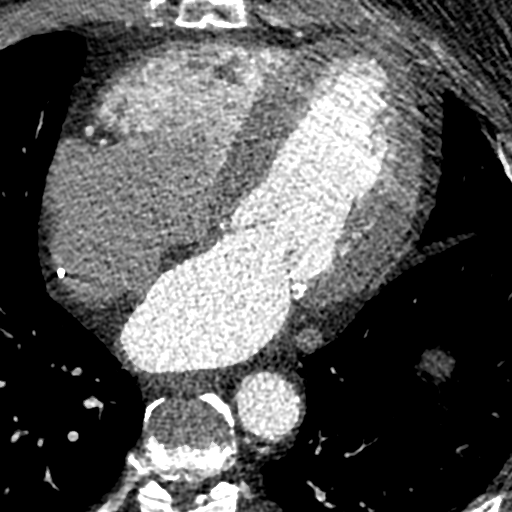
[im 1537/4610  lung]
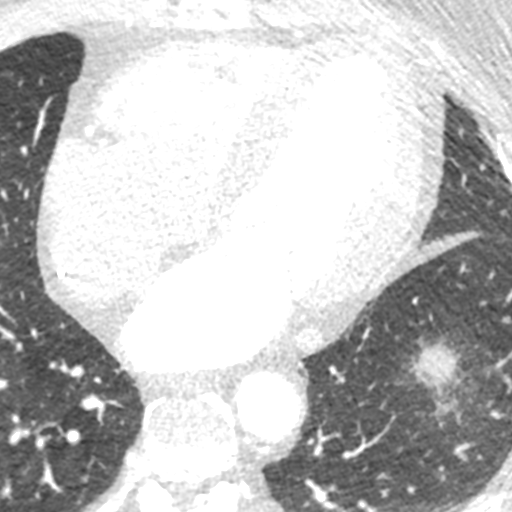
[im 1844/4610  vessel]
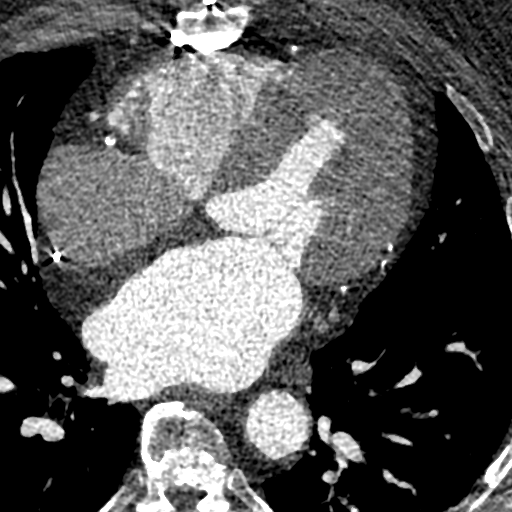
[im 2151/4610  vessel]
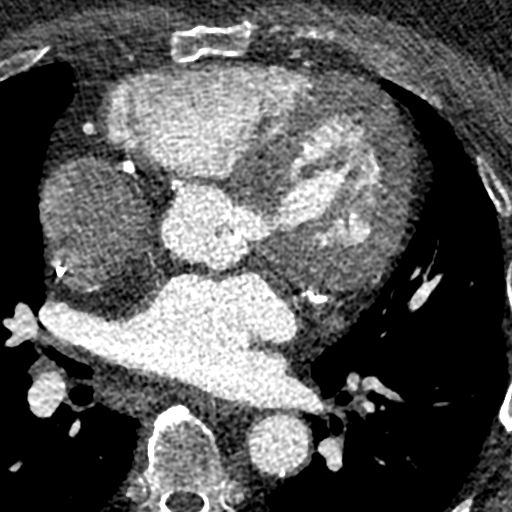
[im 2459/4610  vessel]
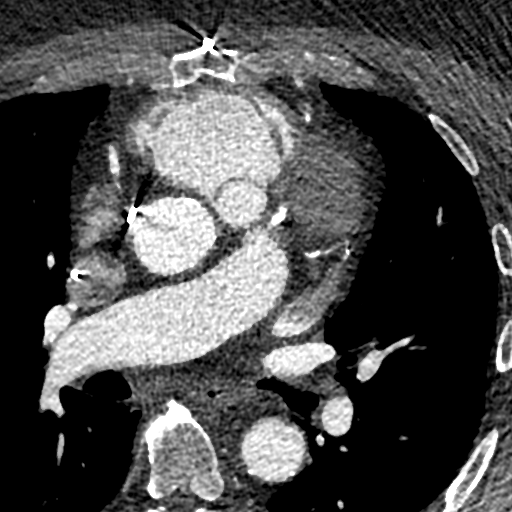
[im 2766/4610  vessel]
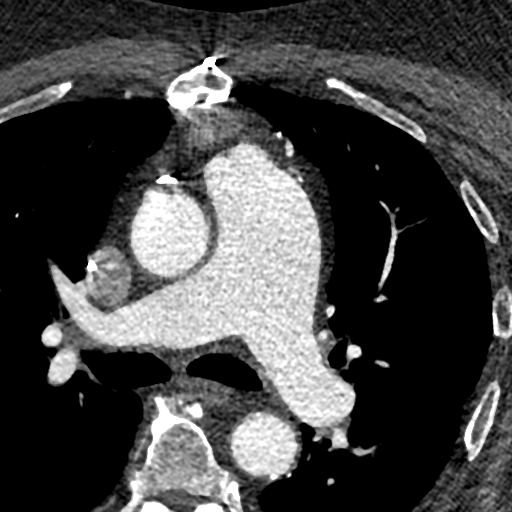
[im 2766/4610  lung]
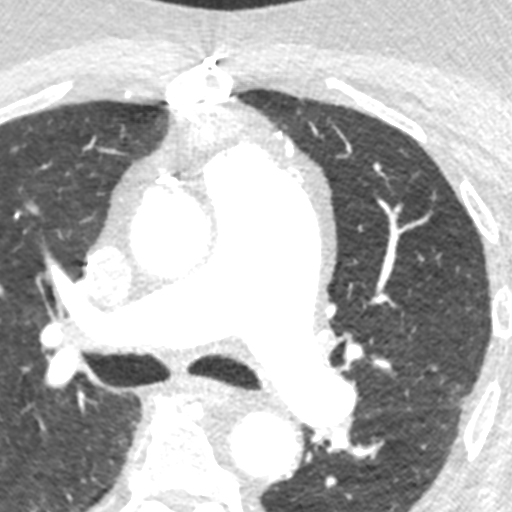
[im 3073/4610  vessel]
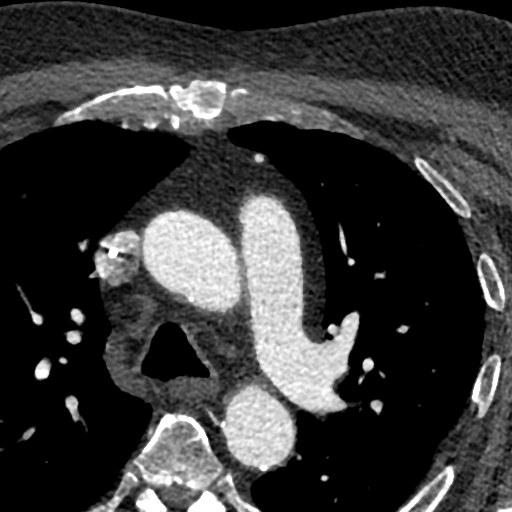
[im 3380/4610  vessel]
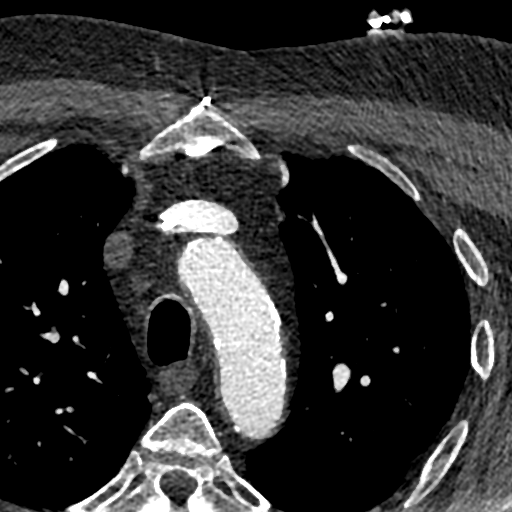
[im 3688/4610  vessel]
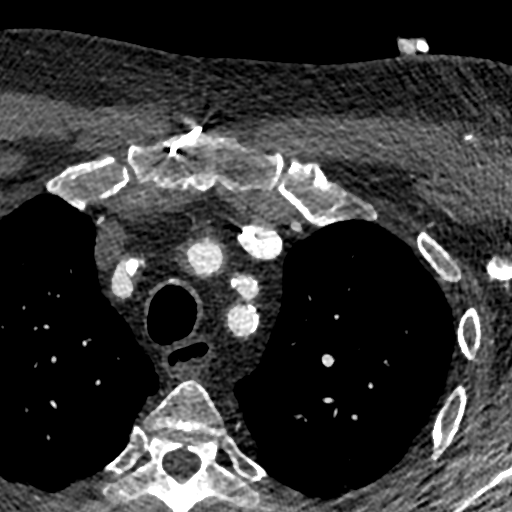
[im 3995/4610  vessel]
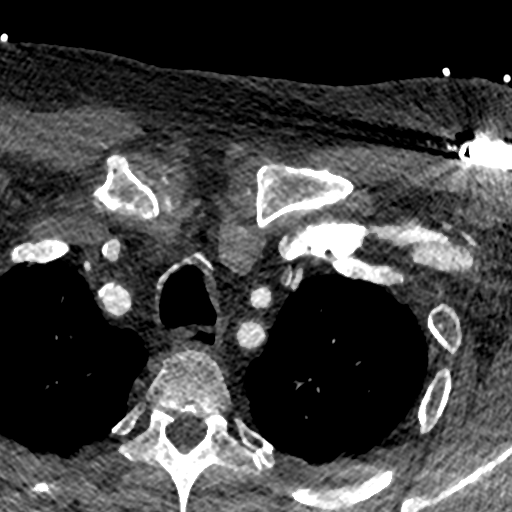
[im 3995/4610  lung]
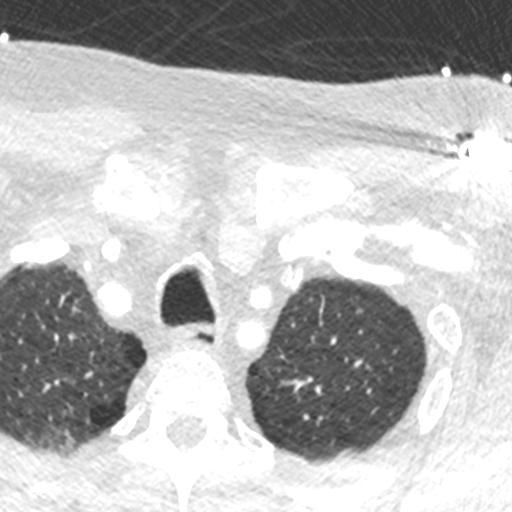
[im 4302/4610  vessel]
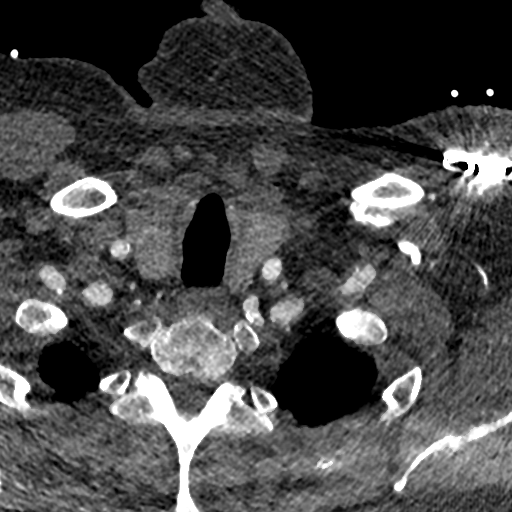

[14 of 20 positions shown; findings below may reference images not displayed]

FINDINGS: Aortic Valve: Tri leaflet and severely calcified with restricted
motion

Aorta: Severe calcific aortic atherosclerosis

Sinotubular Junction: 30 mm

Ascending Thoracic Aorta: 34 mm

Aortic Arch: 31 mm

Descending Thoracic Aorta: 26 mm

Sinus of Valsalva Measurements:

Non-coronary: 30.8 mm

Right -coronary: 31.4 mm

Left -coronary: 31.4 mm

Coronary Artery Height above Annulus:

Left Main: 12.8 mm above annulus

Right Coronary: 15 mm above annulus

Virtual Basal Annulus Measurements:

Maximum/Minimum Diameter: 28.2 mm x 21.9 mm

Perimeter: 80 mm

Area: 476 mm2

Coronary Arteries: Sufficient height above annulus for deployment
Patent ROTORA to RCA Patent free MYRICK to OM and patent [REDACTED] to LAD

Optimum Fluoroscopic Angle for Delivery: LAO 16 degrees Caudal 12
degrees
IMPRESSION: 1. Calcified tri leaflet AV with annulus 476 mm2 suitable for a 26
mm Sapien 3 valve

2. Severe calcific aortic atherosclerosis with bovine arch and
normal root diameter 3.4 mm

3.  Patent ROTORA to RCA, patent free MYRICK to OM and patent [REDACTED] to LAD

4.  Severe LAE with layering of contrast in [place] cannot r/o thrombus

5.  Pacing wires in RA/RV

6. Optimum angiographic angle for deployment LAO 16 degrees Caudal
12 degrees

7.  Coronary arteries suitable height above annulus for deployment

Gardelina Dhe Tapetesh

EXAM:
OVER-READ INTERPRETATION  CT CHEST

The following report is an over-read performed by radiologist Dr.
Gumaro Wan [REDACTED] on 03/30/2017. This
over-read does not include interpretation of cardiac or coronary
anatomy or pathology. The coronary calcium score/coronary CTA
interpretation by the cardiologist is attached.
FINDINGS: Extracardiac findings will be described separately under dictation
for contemporaneously obtained CTA chest, abdomen and pelvis.
IMPRESSION: Please see separate dictation for contemporaneously obtained CTA
chest, abdomen and pelvis dated 03/30/2017 for full description of
relevant extracardiac findings.

## 2019-12-07 MED ORDER — PANTOPRAZOLE SODIUM 40 MG PO TBEC
40.0000 mg | DELAYED_RELEASE_TABLET | Freq: Every day | ORAL | Status: DC
  Administered 2019-12-07 – 2019-12-09 (×3): 40 mg via ORAL
  Filled 2019-12-07 (×4): qty 1

## 2019-12-07 MED ORDER — INSULIN ASPART 100 UNIT/ML ~~LOC~~ SOLN
0.0000 [IU] | Freq: Three times a day (TID) | SUBCUTANEOUS | Status: DC
  Administered 2019-12-08: 2 [IU] via SUBCUTANEOUS
  Administered 2019-12-08: 3 [IU] via SUBCUTANEOUS
  Administered 2019-12-09: 2 [IU] via SUBCUTANEOUS
  Administered 2019-12-10: 1 [IU] via SUBCUTANEOUS
  Administered 2019-12-10: 2 [IU] via SUBCUTANEOUS
  Administered 2019-12-11: 3 [IU] via SUBCUTANEOUS
  Administered 2019-12-11: 2 [IU] via SUBCUTANEOUS
  Administered 2019-12-12: 3 [IU] via SUBCUTANEOUS

## 2019-12-07 MED ORDER — EZETIMIBE 10 MG PO TABS
10.0000 mg | ORAL_TABLET | Freq: Every day | ORAL | Status: DC
  Administered 2019-12-08 – 2019-12-12 (×5): 10 mg via ORAL
  Filled 2019-12-07 (×6): qty 1

## 2019-12-07 MED ORDER — APIXABAN 5 MG PO TABS: 5.0000 mg | ORAL_TABLET | Freq: Two times a day (BID) | ORAL | Status: DC

## 2019-12-07 MED ORDER — ACETAMINOPHEN 325 MG PO TABS: 650.0000 mg | ORAL_TABLET | Freq: Four times a day (QID) | ORAL | Status: DC | PRN

## 2019-12-07 MED ORDER — FUROSEMIDE 10 MG/ML IJ SOLN
40.0000 mg | Freq: Two times a day (BID) | INTRAMUSCULAR | Status: DC
  Administered 2019-12-07 – 2019-12-08 (×2): 40 mg via INTRAVENOUS
  Filled 2019-12-07 (×2): qty 4

## 2019-12-07 MED ORDER — ACETAMINOPHEN 650 MG RE SUPP: 650.0000 mg | Freq: Four times a day (QID) | RECTAL | Status: DC | PRN

## 2019-12-07 MED ORDER — HYDRALAZINE HCL 25 MG PO TABS
25.0000 mg | ORAL_TABLET | Freq: Two times a day (BID) | ORAL | Status: DC
  Administered 2019-12-07 – 2019-12-12 (×9): 25 mg via ORAL
  Filled 2019-12-07 (×10): qty 1

## 2019-12-07 MED ORDER — TAMSULOSIN HCL 0.4 MG PO CAPS
0.4000 mg | ORAL_CAPSULE | Freq: Every day | ORAL | Status: DC
  Administered 2019-12-07 – 2019-12-12 (×6): 0.4 mg via ORAL
  Filled 2019-12-07 (×6): qty 1

## 2019-12-07 MED ORDER — POLYETHYLENE GLYCOL 3350 17 G PO PACK
17.0000 g | PACK | Freq: Two times a day (BID) | ORAL | Status: DC
  Administered 2019-12-07 – 2019-12-09 (×3): 17 g via ORAL
  Filled 2019-12-07 (×4): qty 1

## 2019-12-07 MED ORDER — HYDROCHLOROTHIAZIDE 25 MG PO TABS: 25.0000 mg | ORAL_TABLET | Freq: Every day | ORAL | Status: DC

## 2019-12-07 MED ORDER — SODIUM CHLORIDE 0.9% IV SOLUTION: Freq: Once | INTRAVENOUS | Status: AC

## 2019-12-07 MED ORDER — SODIUM CHLORIDE 0.9 % IV SOLN
10.0000 mL/h | Freq: Once | INTRAVENOUS | Status: AC
  Administered 2019-12-07: 10 mL/h via INTRAVENOUS

## 2019-12-07 MED ORDER — BENAZEPRIL HCL 40 MG PO TABS: 40.0000 mg | ORAL_TABLET | Freq: Every day | ORAL | Status: DC

## 2019-12-07 MED ORDER — AMLODIPINE BESYLATE 2.5 MG PO TABS
2.5000 mg | ORAL_TABLET | Freq: Every day | ORAL | Status: DC
  Administered 2019-12-08 – 2019-12-12 (×5): 2.5 mg via ORAL
  Filled 2019-12-07 (×5): qty 1

## 2019-12-07 MED ORDER — VITAMIN B-12 1000 MCG PO TABS
1000.0000 ug | ORAL_TABLET | Freq: Every day | ORAL | Status: DC
  Administered 2019-12-08 – 2019-12-12 (×5): 1000 ug via ORAL
  Filled 2019-12-07 (×5): qty 1

## 2019-12-07 NOTE — Telephone Encounter (Signed)
I reviewed the lab results with Mrs. Valliant, his recent decline is likely caused by worsening anemia which also caused the CHF symptom. She is aware to decrease lasix to 40mg  daily due to worsening renal function. I instructed Mrs. Duhe to take her husband to the hospital. It is imperative for him to start on blood transfusion. He will also need GI eval as well once Eliquis is on hold. Given elevated proBNP, I would recommend a repeat Echocardiogram in the hospital.   She is taking the patient to the [facility] in [place].   I will follow up on his progress and give them a call this afternoon.

## 2019-12-07 NOTE — ED Triage Notes (Signed)
Pt. Stated, I went to my Dr. Wilburn Mylar and her drew some blood out of my hand and called me last night and said I need to come here for some blood. Im some SOB . Denies any other symptoms.

## 2019-12-07 NOTE — Telephone Encounter (Signed)
Received page from the patient's wife, she does not remember if she needs to go to ED or admitting. I told her she needs to bring her husband to the ED first and ED physician will likely call the hospitalist service to admit and start blood transfusion.

## 2019-12-07 NOTE — ED Notes (Signed)
Admitting at bedside 

## 2019-12-07 NOTE — ED Provider Notes (Signed)
Medical screening examination/treatment/procedure(s) were conducted as a shared visit with non-physician practitioner(s) and myself.  I personally evaluated the patient during the encounter.    79 year old male who presents with low hemoglobin from doctor's office.  Hemoglobin today is 6.4.  Patient to be transfused here and admitted to the hospital   Lacretia Leigh, MD 12/07/19 1135

## 2019-12-07 NOTE — ED Notes (Signed)
Dinner ordered 

## 2019-12-07 NOTE — ED Provider Notes (Signed)
[facility] EMERGENCY DEPARTMENT Provider Note   CSN: [id number] Arrival date & time: 12/07/19  5465     History Chief Complaint  Patient presents with  . Abnormal Lab  . Shortness of Breath    Evan [name] is a 79 y.o. male with a history of CHF last EF 55-60% on 08/2018, afib on Eliquis, hypertension, hyperlipidemia, aortic stenosis S/p TAVR, prior stroke, T2DM, and chronic use of 2L via [place] of supplemental oxygen who presents to the ED due to abnormal labs drawn yesterday @ cardiology clinic.   Patient reports he had felt poorly for the past 3-4 weeks with progressively worsening generalized weakness & dyspnea that is particularly worse with activity, mild alleviation w/ chronic O2 use. Has had some lightheadedness w/ position changes.  No other alleviating/aggravating factors. Had some increased dyspnea & weight gain over the past 1 week which prompted cardiologic clinic visit- had increased dose of Lasix to BID 2 days prior to cardiology evaluation.  Seen @ cardiology clinic yesterday -  appeared to be near euvolemic per cardiology, trace edema w/ clear lungs, recommended continued higher dose of diuretic, labs obtained, plan for 1 week follow up at that time. If renal function decline would cut back on diuretic. Upon return of patient's blood work he was noted to be acutely anemic- called and told to come to the ED for evaluation and likely admission. Patient held dose of Eliquis this AM. Denies NSAID/alcohol use. Denies abdominal pain, melena, hematochezia, hematemesis, hematuria, chest pain, or syncope. Has never had a complete endoscopy/colonoscopy that he can recall.    HPI     Past Medical History:  Diagnosis Date  . Anemia   . Carotid arterial disease ([place])   . CHF (congestive heart failure) ([place])   . Coronary artery disease   . Hearing loss   . HOH (hard of hearing)   . Hyperlipidemia   . Hypertension   . Nocturia   . Obesity   . Osteoarthritis     "BUE; BLE; right knee" (03/23/2017)  . Peripheral vision loss, bilateral    S/P 07/2015  . Permanent atrial fibrillation ([place])   . 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 Severe aortic stenosis   . Sinus drainage   . Stroke [facility]) 07/2015   "peripheral vision still bad out of left eye" (03/23/2017)  . Torn rotator cuff 2012   Right arm  . Type II diabetes mellitus ([place])   . Urinary frequency     Patient Active Problem List   Diagnosis Date Noted  . Macrocytic anemia 04/16/2019  . S/P TAVR (transcatheter aortic valve replacement)   . Bronchitis 04/17/2017  . Severe aortic stenosis 03/23/2017  . Atrial fibrillation, permanent ([place]) 03/23/2017  . Peripheral vision loss, bilateral   . Osteoarthritis   . Hypertension   . HOH (hard of hearing)   . Heart murmur   . Frequency of urination   . Dyspnea   . Bruises easily   . Diabetes mellitus type 2, uncomplicated ([place]) 03/54/6568  . Essential hypertension 10/15/2015  . Cerebral infarction due to stenosis of right posterior cerebral artery ([place]) 08/24/2015  . Diarrhea 08/24/2015  . Stroke ([place]) 07/25/2015  . Edema of leg 03/20/2015  . Bradycardia 03/20/2015  . Nocturia 10/24/2014  . Atrial fibrillation ([place]) 02/20/2013  . Aftercare following surgery of the circulatory system, [place] 06/07/2012  . Occlusion and stenosis of carotid artery without mention of cerebral infarction 04/14/2011  . Shoulder pain 04/14/2011  .  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 Diabetes mellitus ([place]) 02/01/2011  . Coronary artery disease   . Hyperlipidemia   . Hypertensive urgency   . Obesity   . Carotid arterial disease ([place])   . Torn rotator cuff 01/24/2010    Past Surgical History:  Procedure Laterality Date  . BACK SURGERY    . CARDIAC CATHETERIZATION  11/29/2005   SEVERE THREE VESSEL OBSTRUCTIVE ATHERSCLEROTIC CAD. ALL GRAFTS WERE PATENT. EF 65%  . CARDIAC CATHETERIZATION  03/23/2017    . CAROTID ENDARTERECTOMY Right 01-29-08   cea  . CARPAL TUNNEL RELEASE Right   . CORONARY ARTERY BYPASS GRAFT  03/2003   X3. LIMA GRAFT TO THE LAD, SAPHENOUS VEIN GRAFT TO THE PA, AND A LEFT RADIAL GRAFT TO THEOBTUSE MARGINAL VESSEL  . ENDARTERECTOMY Left 05/28/2012   Procedure: ENDARTERECTOMY CAROTID;  Surgeon: Elam Dutch, MD;  Location: [facility];  Service: Vascular;  Laterality: Left;  . INSERT / REPLACE / REMOVE PACEMAKER  03/23/2017  . Benjamin Perez; 1984  . MOLE SURGERY     "? face/arms"  . PACEMAKER IMPLANT N/A 03/23/2017   Procedure: PACEMAKER IMPLANT;  Surgeon: Constance Haw, MD;  Location: [facility] CV LAB;  Service: Cardiovascular;  Laterality: N/A;  . PATCH ANGIOPLASTY Left 05/28/2012   Procedure: WITH dACRON PATCH ANGIOPLASTY;  Surgeon: Elam Dutch, MD;  Location: [facility];  Service: Vascular;  Laterality: Left;  . RIGHT/LEFT HEART CATH AND CORONARY/GRAFT ANGIOGRAPHY N/A 03/23/2017   Procedure: RIGHT/LEFT HEART CATH AND CORONARY/GRAFT ANGIOGRAPHY;  Surgeon: Sherren Mocha, MD;  Location: [facility];  Service: Cardiovascular;  Laterality: N/A;  . TEE WITHOUT CARDIOVERSION N/A 05/09/2017   Procedure: TRANSESOPHAGEAL ECHOCARDIOGRAM (TEE);  Surgeon: Sherren Mocha, MD;  Location: [facility];  Service: Open Heart Surgery;  Laterality: N/A;  . TRANSCATHETER AORTIC VALVE REPLACEMENT, TRANSFEMORAL N/A 05/09/2017   Procedure: TRANSCATHETER AORTIC VALVE REPLACEMENT, TRANSFEMORAL using an Edwards 51mm Sapien 3 Aortic Valve;  Surgeon: Sherren Mocha, MD;  Location: [facility];  Service: Open Heart Surgery;  Laterality: N/A;       Family History  Problem Relation Age of Onset  . Heart attack Father   . Heart attack Brother   . Parkinsonism Brother   . Cancer Brother   . Arrhythmia Brother     Social History   Tobacco Use  . Smoking status: Former Smoker    Packs/day: 2.00    Years: 39.00    Pack years: 78.00    Types: Cigarettes    Quit date: 01/24/1994     Years since quitting: 25.8  . Smokeless tobacco: Never Used  Vaping Use  . Vaping Use: Never used  Substance Use Topics  . Alcohol use: No  . Drug use: No    Home Medications Prior to Admission medications   Medication Sig Start Date End Date Taking? Authorizing Provider  acetaminophen (TYLENOL) 650 MG CR tablet Take 1,300 mg by mouth every 8 (eight) hours as needed for pain.     [provider]  amLODipine (NORVASC) 2.5 MG tablet Take 1 tablet (2.5 mg total) by mouth daily. 11/27/19 02/25/20  [name], [name] M, MD  apixaban (ELIQUIS) 5 MG TABS tablet Take 1 tablet (5 mg total) by mouth every 12 (twelve) hours. 11/27/19   [name], [name] M, MD  benazepril (LOTENSIN) 40 MG tablet TAKE 1 TABLET BY MOUTH EVERY DAY 01/14/19   [name], [name] M, MD  cyanocobalamin 1000 MCG tablet  Take by mouth.    [provider]  ezetimibe (ZETIA) 10 MG tablet TAKE 1 TABLET BY MOUTH EVERY DAY 01/08/19   [name], [name] M, MD  fluticasone [facility]) 50 MCG/ACT nasal spray Place 2 sprays into both nostrils daily.    [provider]  furosemide (LASIX) 40 MG tablet Take 40 mg by mouth 2 (two) times daily.    [provider]  glimepiride (AMARYL) 2 MG tablet TAKE 1 TABLET (2 MG TOTAL) BY MOUTH DAILY WITH BREAKFAST 01/25/18   [name], [name] M, MD  hydrALAZINE (APRESOLINE) 25 MG tablet Take 1 tablet (25 mg total) by mouth 2 (two) times daily. 06/10/19   [name], [name] M, MD  hydrochlorothiazide (HYDRODIURIL) 25 MG tablet TAKE 1 TABLET BY MOUTH EVERY DAY IN THE MORNING 10/03/18   Bovard-Stuckert, Jody, MD  loratadine (CLARITIN) 10 MG tablet Take 10 mg by mouth daily.    [provider]  omeprazole (PRILOSEC) 20 MG capsule Take 2 capsules (40 mg total) by mouth daily. 10/17/19   [name], [name] M, MD  OVER THE COUNTER MEDICATION Replenex tablets: Take 3 tablet by mouth every morning (joint health)    [provider]  Polyvinyl Alcohol-Povidone (REFRESH OP) Place 2 drops into both eyes  daily as needed (for dry eyes).     [provider]  tamsulosin (FLOMAX) 0.4 MG CAPS capsule TAKE 1 CAPSULE BY MOUTH ONCE DAILY Patient taking differently: TAKE 0.4 MG BY MOUTH ONCE DAILY IN THE EVENING 09/21/15   [name], [name] M, MD    Allergies    Niacin, Niacin and related, Metformin and related, and Statins  Review of Systems   Review of Systems  Constitutional: Negative for chills and fever.  Respiratory: Positive for shortness of breath.   Cardiovascular: Negative for chest pain.  Gastrointestinal: Negative for abdominal pain, blood in stool, constipation, diarrhea and vomiting.  Genitourinary: Negative for dysuria and hematuria.  Neurological: Positive for weakness and light-headedness. Negative for syncope.  All other systems reviewed and are negative.   Physical Exam Updated Vital Signs BP 113/62 (BP Location: Left Arm)   Pulse 64   Temp 97.8 F (36.6 C) (Oral)   Resp 16   Ht 6' (1.829 m)   Wt 125.2 kg   SpO2 100%   BMI 37.43 kg/m   Physical Exam Vitals and nursing note reviewed.  Constitutional:      General: He is not in acute distress.    Appearance: He is well-developed. He is not toxic-appearing.  HENT:     Head: Normocephalic and atraumatic.  Eyes:     General:        Right eye: No discharge.        Left eye: No discharge.     Comments: Conjunctival pallor noted.   Cardiovascular:     Rate and Rhythm: Normal rate and regular rhythm.  Pulmonary:     Effort: Pulmonary effort is normal. No respiratory distress.     Breath sounds: Normal breath sounds. No wheezing, rhonchi or rales.     Comments: Sp02 100% on baseline 2L via [place] Abdominal:     Palpations: Abdomen is soft.     Tenderness: There is no abdominal tenderness. There is no guarding.  Genitourinary:    Comments: Chaperone present.  DRE soft brown stool. No melena/hematochezia. Fecal occult negative.  Musculoskeletal:     Cervical back: Neck supple.     Comments: Trace to 1+  symmetric pitting edema to the bilateral lower legs.  Skin:    General: Skin is warm and dry.     Findings: No rash.  Neurological:     Mental Status: He is alert.     Comments: Clear speech.   Psychiatric:        Behavior: Behavior normal.     ED Results / Procedures / Treatments   Labs (all labs ordered are listed, but only abnormal results are displayed) Labs Reviewed  BASIC METABOLIC PANEL - Abnormal; Notable for the following components:      Result Value   Glucose, Bld 196 (*)    BUN 57 (*)    Creatinine, Ser 2.48 (*)    GFR, Estimated 26 (*)    All other components within normal limits  CBC - Abnormal; Notable for the following components:   RBC 1.87 (*)    Hemoglobin 6.4 (*)    HCT 21.5 (*)    MCV 115.0 (*)    MCH 34.2 (*)    MCHC 29.8 (*)    RDW 18.7 (*)    All other components within normal limits  RESPIRATORY PANEL BY RT PCR (FLU A&B, COVID)  VITAMIN B12  FOLATE  IRON AND TIBC  FERRITIN  RETICULOCYTES  HEPATIC FUNCTION PANEL  HEMOGLOBIN A1C  POC OCCULT BLOOD, ED  TYPE AND SCREEN  PREPARE RBC (CROSSMATCH)    EKG None  Radiology DG Chest 2 View  Result Date: 12/07/2019 CLINICAL DATA:  Shortness of breath. EXAM: CHEST - 2 VIEW COMPARISON:  03/29/2018 FINDINGS: 1016 hours. The cardio pericardial silhouette is enlarged. There is pulmonary vascular congestion without overt pulmonary edema. Diffuse interstitial opacity suggest component of underlying interstitial edema. Left single lead permanent pacemaker noted. Bones are demineralized. IMPRESSION: Enlargement of the cardiopericardial silhouette with pulmonary vascular congestion and probable interstitial edema. Electronically Signed   By: Misty Stanley M.D.   On: 12/07/2019 10:40    Procedures .Critical Care Performed by: Amaryllis Dyke, PA-C Authorized by: Amaryllis Dyke, PA-C    CRITICAL CARE Performed by: Kennith Maes   Total critical care time: 35 minutes  Critical  care time was exclusive of separately billable procedures and treating other patients.  Critical care was necessary to treat or prevent imminent or life-threatening deterioration.  Critical care was time spent personally by me on the following activities: development of treatment plan with patient and/or surrogate as well as nursing, discussions with consultants, evaluation of patient's response to treatment, examination of patient, obtaining history from patient or surrogate, ordering and performing treatments and interventions, ordering and review of laboratory studies, ordering and review of radiographic studies, pulse oximetry and re-evaluation of patient's condition.  (including critical care time)  Medications Ordered in ED Medications - No data to display  ED Course  I have reviewed the triage vital signs and the nursing notes.  Pertinent labs & imaging results that were available during my care of the patient were reviewed by me and considered in my medical decision making (see chart for details).    MDM Rules/Calculators/A&P                         Patient presents to the ED with sxs of 3-4 weeks of generalized weakness & dyspnea, found to be anemic by cardiology & sent to the ED for further management. Nontoxic, vitals WNL. Conjunctival pallor noted on exam. Lungs clears. Trace to 1+ symmetric LE edema. Abdomen nontender. DRE w/ soft brown stool, fecal occult negative.   Additional  history obtained:  Additional history obtained from chart reivew & nursing note review. Previous records obtained and reviewed as discussed in HPI.   EKG: Paced Lab Tests:  I reviewed and interpreted labs, which included:  CBC: Anemia w/ hgb 6.4 hct 21.5- improved some from yesterday but acutely worsened from 7 months prior. No WBC or PLT dysfunction.  BMP: worsening renal function.   I have ordered a hepatic function panel & anemia panel for further assessment.   Imaging Studies ordered:  CXR  ordered per triage, I independently visualized and interpreted imaging which showed enlargement of the cardiopericardial silhouette with pulmonary vascular congestion and probable interstitial edema.  Patient with new anemia & AKI, does have findings of CHF on CXR however lungs are clear & is saturating well on baseline oxygen, took lasix this AM. Anemia of unknown etiology, fecal occult negative, anemia panel ordered. Will start with 1 unit of blood for transfusion & discuss w/ hospitliast service for admission.   11:32: CONSULT: Discussed with internal medicine residency service- accept admission.   Patient & his wife updated on results & plan of care- in agreement.   Findings and plan of care discussed with supervising physician Dr. Zenia Resides who is in agreement.   Portions of this note were generated with [employer]. Dictation errors may occur despite best attempts at proofreading.  Final Clinical Impression(s) / ED Diagnoses Final diagnoses:  Symptomatic anemia    Rx / DC Orders ED Discharge Orders    None       Amaryllis Dyke, PA-C 12/07/19 1427    Lacretia Leigh, MD 12/08/19 [phone]

## 2019-12-07 NOTE — Consult Note (Signed)
[facility] Gastroenterology Consult  Referring Provider: Internal Medicine Teaching Service Primary Care Physician:  Leonel Ramsay, MD Primary Gastroenterologist: Althia Forts  Reason for Consultation: Anemia  HPI: Evan [name] is a 79 y.o. male with past medical history of chronic anemia, diastolic CHF, atrial fibrillation, status post pacemaker placement, on Eliquis(last dose yesterday), cirrhosis diagnosed on imaging, hypertension, dyslipidemia, coronary artery disease, status post CABG, carotid artery disease, aortic stenosis status post TAVR, prior CVA, type 2 diabetes, oxygen dependent, 2 L via nasal cannula presented to the ER with anemia.  Patient was evaluated recently by cardiology for progressively worsening shortness of breath and was found to have a hemoglobin of 6.2, was advised to hold Eliquis and to proceed to ER for blood transfusions.  Patient has never had an endoscopy or colonoscopy in the past. He denies black stools, blood in stools, vomiting blood. He denies acid reflux, heartburn, difficulty swallowing, pain on swallowing, early satiety, bloating. He reports weight loss with use of diuretics otherwise denies decreased appetite.  He is aware that he has cirrhosis of liver, however has not had an appointment or evaluation by a gastroenterologist. There has been no history of ascites/paracentesis, variceal bleeding or encephalopathy to suggest decompensated disease.  He has been followed up by hematology for macrocytic anemia and was recommended a bone marrow biopsy which the patient had refused.  He was also suggested to undergo an endoscopy and a colonoscopy but they have not been performed as yet.  Most of the history is obtained from the patient's wife who is present at bedside in the ER, patient is slightly hard of hearing.   Past Medical History:  Diagnosis Date  . Anemia   . Carotid arterial disease ([place])   . CHF (congestive heart failure) ([place])   . Coronary  artery disease   . Hearing loss   . HOH (hard of hearing)   . Hyperlipidemia   . Hypertension   . Nocturia   . Obesity   . Osteoarthritis    "BUE; BLE; right knee" (03/23/2017)  . Peripheral vision loss, bilateral    S/P 07/2015  . Permanent atrial fibrillation ([place])   . 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 Severe aortic stenosis   . Sinus drainage   . Stroke [facility]) 07/2015   "peripheral vision still bad out of left eye" (03/23/2017)  . Torn rotator cuff 2012   Right arm  . Type II diabetes mellitus ([place])   . Urinary frequency     Past Surgical History:  Procedure Laterality Date  . BACK SURGERY    . CARDIAC CATHETERIZATION  11/29/2005   SEVERE THREE VESSEL OBSTRUCTIVE ATHERSCLEROTIC CAD. ALL GRAFTS WERE PATENT. EF 65%  . CARDIAC CATHETERIZATION  03/23/2017  . CAROTID ENDARTERECTOMY Right 01-29-08   cea  . CARPAL TUNNEL RELEASE Right   . CORONARY ARTERY BYPASS GRAFT  03/2003   X3. LIMA GRAFT TO THE LAD, SAPHENOUS VEIN GRAFT TO THE PA, AND A LEFT RADIAL GRAFT TO THEOBTUSE MARGINAL VESSEL  . ENDARTERECTOMY Left 05/28/2012   Procedure: ENDARTERECTOMY CAROTID;  Surgeon: Elam Dutch, MD;  Location: [place];  Service: Vascular;  Laterality: Left;  . INSERT / REPLACE / REMOVE PACEMAKER  03/23/2017  . [place]; 1984  . MOLE SURGERY     "? face/arms"  . PACEMAKER IMPLANT N/A 03/23/2017   Procedure: PACEMAKER IMPLANT;  Surgeon: Constance Haw, MD;  Location: [place] CV LAB;  Service: Cardiovascular;  Laterality: N/A;  .  PATCH ANGIOPLASTY Left 05/28/2012   Procedure: WITH dACRON PATCH ANGIOPLASTY;  Surgeon: Elam Dutch, MD;  Location: [place];  Service: Vascular;  Laterality: Left;  . RIGHT/LEFT HEART CATH AND CORONARY/GRAFT ANGIOGRAPHY N/A 03/23/2017   Procedure: RIGHT/LEFT HEART CATH AND CORONARY/GRAFT ANGIOGRAPHY;  Surgeon: Sherren Mocha, MD;  Location: [place] CV LAB;  Service: Cardiovascular;  Laterality: N/A;  . TEE WITHOUT  CARDIOVERSION N/A 05/09/2017   Procedure: TRANSESOPHAGEAL ECHOCARDIOGRAM (TEE);  Surgeon: Sherren Mocha, MD;  Location: [place];  Service: Open Heart Surgery;  Laterality: N/A;  . TRANSCATHETER AORTIC VALVE REPLACEMENT, TRANSFEMORAL N/A 05/09/2017   Procedure: TRANSCATHETER AORTIC VALVE REPLACEMENT, TRANSFEMORAL using an Edwards 27m Sapien 3 Aortic Valve;  Surgeon: CSherren Mocha MD;  [place]  Service: Open Heart Surgery;  Laterality: N/A;    Prior to Admission medications   Medication Sig Start Date End Date Taking? Authorizing Provider  acetaminophen (TYLENOL) 650 MG CR tablet Take 1,300 mg by mouth every 8 (eight) hours as needed for pain.    Yes [provider]  amLODipine (NORVASC) 2.5 MG tablet Take 1 tablet (2.5 mg total) by mouth daily. 11/27/19 02/25/20 Yes JMartinique [name] M, MD  apixaban (ELIQUIS) 5 MG TABS tablet Take 1 tablet (5 mg total) by mouth every 12 (twelve) hours. 11/27/19  Yes JMartinique [name] M, MD  benazepril (LOTENSIN) 40 MG tablet TAKE 1 TABLET BY MOUTH EVERY DAY Patient taking differently: Take 40 mg by mouth daily.  01/14/19  Yes JMartinique [name] M, MD  cyanocobalamin 1000 MCG tablet Take 1,000 mcg by mouth daily.    Yes [provider]  ezetimibe (ZETIA) 10 MG tablet TAKE 1 TABLET BY MOUTH EVERY DAY Patient taking differently: Take 10 mg by mouth daily.  01/08/19  Yes JMartinique [name] M, MD  furosemide (LASIX) 40 MG tablet Take 40 mg by mouth daily.    Yes [provider]  glimepiride (AMARYL) 2 MG tablet TAKE 1 TABLET (2 MG TOTAL) BY MOUTH DAILY WITH BREAKFAST Patient taking differently: Take 2 mg by mouth daily with breakfast.  01/25/18  Yes JMartinique [name] M, MD  hydrALAZINE (APRESOLINE) 25 MG tablet Take 1 tablet (25 mg total) by mouth 2 (two) times daily. 06/10/19  Yes JMartinique [name] M, MD  hydrochlorothiazide (HYDRODIURIL) 25 MG tablet TAKE 1 TABLET BY MOUTH EVERY DAY IN THE MORNING Patient taking differently: Take 25 mg by mouth daily.  10/03/18   Yes Bovard-Stuckert, Jody, MD  loratadine (CLARITIN) 10 MG tablet Take 10 mg by mouth daily.   Yes [provider]  omeprazole (PRILOSEC) 20 MG capsule Take 2 capsules (40 mg total) by mouth daily. 10/17/19  Yes JMartinique [name] M, MD  Polyvinyl Alcohol-Povidone (REFRESH OP) Place 1 drop into both eyes daily as needed (for dry eyes).    Yes [provider]  tamsulosin (FLOMAX) 0.4 MG CAPS capsule TAKE 1 CAPSULE BY MOUTH ONCE DAILY Patient taking differently: Take 0.4 mg by mouth daily.  09/21/15  Yes JMartinique [name] M, MD    Current Facility-Administered Medications  Medication Dose Route Frequency Provider Last Rate Last Admin  . acetaminophen (TYLENOL) tablet 650 mg  650 mg Oral Q6H PRN Christian, Rylee, MD       Or  . acetaminophen (TYLENOL) suppository 650 mg  650 mg Rectal Q6H PRN CDarrick Meigs Rylee, MD      . [Derrill MemoON 12/08/2019] amLODipine (NORVASC) tablet 2.5 mg  2.5 mg Oral Daily Christian, Rylee, MD      . apixaban ([name]  tablet 5 mg  5 mg Oral Q12H Christian, Rylee, MD      . Derrill Memo ON 12/08/2019] benazepril (LOTENSIN) tablet 40 mg  40 mg Oral Daily Christian, Rylee, MD      . ezetimibe (ZETIA) tablet 10 mg  10 mg Oral Daily Christian, Rylee, MD      . furosemide (LASIX) injection 40 mg  40 mg Intravenous BID Christian, Rylee, MD      . hydrALAZINE (APRESOLINE) tablet 25 mg  25 mg Oral BID Christian, Rylee, MD      . Derrill Memo ON 12/08/2019] hydrochlorothiazide (HYDRODIURIL) tablet 25 mg  25 mg Oral Daily Christian, Rylee, MD      . insulin aspart (novoLOG) injection 0-15 Units  0-15 Units Subcutaneous TID WC Christian, Rylee, MD      . pantoprazole (PROTONIX) EC tablet 40 mg  40 mg Oral Daily Christian, Rylee, MD      . tamsulosin (FLOMAX) capsule 0.4 mg  0.4 mg Oral Daily Christian, Rylee, MD      . Derrill Memo ON 12/08/2019] vitamin B-12 (CYANOCOBALAMIN) tablet 1,000 mcg  1,000 mcg Oral Daily Mitzi Hansen, MD       Current Outpatient Medications  Medication Sig  Dispense Refill  . acetaminophen (TYLENOL) 650 MG CR tablet Take 1,300 mg by mouth every 8 (eight) hours as needed for pain.     Marland Kitchen amLODipine (NORVASC) 2.5 MG tablet Take 1 tablet (2.5 mg total) by mouth daily. 90 tablet 3  . apixaban (ELIQUIS) 5 MG TABS tablet Take 1 tablet (5 mg total) by mouth every 12 (twelve) hours. 180 tablet 3  . benazepril (LOTENSIN) 40 MG tablet TAKE 1 TABLET BY MOUTH EVERY DAY (Patient taking differently: Take 40 mg by mouth daily. ) 90 tablet 3  . cyanocobalamin 1000 MCG tablet Take 1,000 mcg by mouth daily.     Marland Kitchen ezetimibe (ZETIA) 10 MG tablet TAKE 1 TABLET BY MOUTH EVERY DAY (Patient taking differently: Take 10 mg by mouth daily. ) 90 tablet 2  . furosemide (LASIX) 40 MG tablet Take 40 mg by mouth daily.     Marland Kitchen glimepiride (AMARYL) 2 MG tablet TAKE 1 TABLET (2 MG TOTAL) BY MOUTH DAILY WITH BREAKFAST (Patient taking differently: Take 2 mg by mouth daily with breakfast. ) 90 tablet 0  . hydrALAZINE (APRESOLINE) 25 MG tablet Take 1 tablet (25 mg total) by mouth 2 (two) times daily. 180 tablet 3  . hydrochlorothiazide (HYDRODIURIL) 25 MG tablet TAKE 1 TABLET BY MOUTH EVERY DAY IN THE MORNING (Patient taking differently: Take 25 mg by mouth daily. ) 90 tablet 0  . loratadine (CLARITIN) 10 MG tablet Take 10 mg by mouth daily.    Marland Kitchen omeprazole (PRILOSEC) 20 MG capsule Take 2 capsules (40 mg total) by mouth daily. 30 capsule 11  . Polyvinyl Alcohol-Povidone (REFRESH OP) Place 1 drop into both eyes daily as needed (for dry eyes).     . tamsulosin (FLOMAX) 0.4 MG CAPS capsule TAKE 1 CAPSULE BY MOUTH ONCE DAILY (Patient taking differently: Take 0.4 mg by mouth daily. ) 90 capsule 3    Allergies as of 12/07/2019 - Review Complete 12/07/2019  Allergen Reaction Noted  . Niacin Other (See Comments) and Rash 06/07/2012  . Niacin and related Rash and Other (See Comments) 06/07/2012  . Metformin and related Diarrhea 08/25/2015  . Statins Other (See Comments) 08/25/2015    Family  History  Problem Relation Age of Onset  . Heart attack Father   . Heart attack  Brother   . Parkinsonism Brother   . Cancer Brother   . Arrhythmia Brother     Social History   Socioeconomic History  . Marital status: Married    Spouse name: Not on file  . Number of children: 5  . Years of education: Not on file  . Highest education level: Not on file  Occupational History  . Occupation: retired    Fish farm manager: RETIRED  Tobacco Use  . Smoking status: Former Smoker    Packs/day: 2.00    Years: 39.00    Pack years: 78.00    Types: Cigarettes    Quit date: 01/24/1994    Years since quitting: 25.8  . Smokeless tobacco: Never Used  Vaping Use  . Vaping Use: Never used  Substance and Sexual Activity  . Alcohol use: No  . Drug use: No  . Sexual activity: Never  Other Topics Concern  . Not on file  Social History Narrative   On Farm/ in [place]; no alcohol; quit smoking- 96; worked in Estate agent.    Social Determinants of Health   Financial Resource Strain:   . Difficulty of Paying Living Expenses: Not on file  Food Insecurity:   . Worried About Charity fundraiser in the Last Year: Not on file  . Ran Out of Food in the Last Year: Not on file  Transportation Needs:   . Lack of Transportation (Medical): Not on file  . Lack of Transportation (Non-Medical): Not on file  Physical Activity:   . Days of Exercise per Week: Not on file  . Minutes of Exercise per Session: Not on file  Stress:   . Feeling of Stress : Not on file  Social Connections:   . Frequency of Communication with Friends and Family: Not on file  . Frequency of Social Gatherings with Friends and Family: Not on file  . Attends Religious Services: Not on file  . Active Member of Clubs or Organizations: Not on file  . Attends Archivist Meetings: Not on file  . Marital Status: Not on file  Intimate Partner Violence:   . Fear of Current or Ex-Partner: Not on file  . Emotionally Abused:  Not on file  . Physically Abused: Not on file  . Sexually Abused: Not on file    Review of Systems: Positive for: GI: Described in detail in HPI.    Gen:  fatigue, weakness, malaise, denies any fever, chills, rigors, night sweats, anorexia, involuntary weight loss, and sleep disorder CV: Denies chest pain, angina, palpitations, syncope, orthopnea, PND, peripheral edema, and claudication. Resp: dyspnea, denies  cough, sputum, wheezing, coughing up blood. GU : Denies urinary burning, blood in urine, urinary frequency, urinary hesitancy, nocturnal urination, and urinary incontinence. MS: ambulates with a walker at home Derm: Denies rash, itching, oral ulcerations, hives, unhealing ulcers.  Psych: Denies depression, anxiety, memory loss, suicidal ideation, hallucinations,  and confusion. Heme: Denies bruising, bleeding, and enlarged lymph nodes. Neuro:  Denies any headaches, dizziness, paresthesias. Endo:  DM, denies any problems with , thyroid, adrenal function.  Physical Exam: Vital signs in last 24 hours: Temp:  [97.3 F (36.3 C)-97.8 F (36.6 C)] 97.3 F (36.3 C) (11/13 1410) Pulse Rate:  [64-69] 65 (11/13 1500) Resp:  [15-19] 19 (11/13 1500) BP: (108-143)/(52-65) 143/65 (11/13 1500) SpO2:  [96 %-100 %] 96 % (11/13 1500) Weight:  [124.6 kg-125.2 kg] 125.2 kg (11/13 0959)    General:   Alert,  Well-developed, overweight, pleasant and cooperative in  NAD Head:  Normocephalic and atraumatic. Eyes:  Sclera clear, no icterus.  Prominent pallor Ears: Hard of hearing. Nose:  No deformity, discharge,  or lesions. Mouth:  No deformity or lesions.  Oropharynx pink & moist. Neck:  Supple; no masses or thyromegaly. Lungs:  Clear throughout to auscultation.   No wheezes, crackles, or rhonchi. No acute distress. Heart:  Regular rate and rhythm Extremities:  Without clubbing or edema. Neurologic:  Alert and  oriented x4;  grossly normal neurologically. Skin:  Intact without significant  lesions or rashes. Psych:  Alert and cooperative. Normal mood and affect. Abdomen:  Soft, nontender and distended. No masses, hepatosplenomegaly or hernias noted. Normal bowel sounds, without guarding, and without rebound.         Lab Results: Recent Labs    12/06/19 1630 12/07/19 1015  WBC 5.5 5.7  HGB 6.2* 6.4*  HCT 19.6* 21.5*  PLT 287 265   BMET Recent Labs    12/06/19 1630 12/07/19 1015  NA 141 139  K 4.4 3.9  CL 98 99  CO2 28 28  GLUCOSE 155* 196*  BUN 54* 57*  CREATININE 2.32* 2.48*  CALCIUM 9.0 9.0   LFT No results for input(s): PROT, ALBUMIN, AST, ALT, ALKPHOS, BILITOT, BILIDIR, IBILI in the last 72 hours. PT/INR No results for input(s): LABPROT, INR in the last 72 hours.  Studies/Results: DG Chest 2 View  Result Date: 12/07/2019 CLINICAL DATA:  Shortness of breath. EXAM: CHEST - 2 VIEW COMPARISON:  03/29/2018 FINDINGS: 1016 hours. The cardio pericardial silhouette is enlarged. There is pulmonary vascular congestion without overt pulmonary edema. Diffuse interstitial opacity suggest component of underlying interstitial edema. Left single lead permanent pacemaker noted. Bones are demineralized. IMPRESSION: Enlargement of the cardiopericardial silhouette with pulmonary vascular congestion and probable interstitial edema. Electronically Signed   By: Misty Stanley M.D.   On: 12/07/2019 10:40    Impression: Symptomatic anemia without obvious melena or hematochezia, hemoglobin 6.4 on presentation but with an elevated MCV of 115 FOBT negative  Renal impairment, BUN 57, creatinine 2.48, GFR 26  Cirrhosis noted on CT from 2019  Multiple comorbidities-CAD, CABG, hypertension, atrial fibrillation, pacemaker, TAVR, carotid artery disease, CHF   Plan: Recommend cardiology evaluation and possible clearance required for EGD and colonoscopy under anesthesia. I will keep the patient on clear liquid diet for now, and start him on MiraLAX 17 g twice daily. Last dose of  Eliquis was yesterday. Will order an ultrasound of the abdomen for evaluation of cirrhosis-currently there are no signs of decompensation. Will order LFTs, PT/INR for [facility] calculation along with an alpha-fetoprotein for [facility] screening. I have discussed with the patient as well as his wife at bedside that he will benefit from an EGD and colonoscopy this admission if he is cleared from cardiac standpoint. We will continue to follow   LOS: 0 days   Ronnette Juniper, MD  12/07/2019, 3:46 PM

## 2019-12-07 NOTE — H&P (Signed)
Date: 12/07/2019               Patient Name:  Evan [name] MRN: [id number]  DOB: December 15, 1940 Age / Sex: 79 y.o., male   PCP: Evan Ramsay, MD         Medical Service: Internal Medicine Teaching Service         Attending Physician: Dr. Aldine Contes, MD    First Contact: Dr. Cato Mulligan, MD Pager: [phone]  Second Contact: Dr. Mitzi Hansen, MD Pager: [phone]       After Hours (After 5p/  First Contact Pager: [phone]  weekends / holidays): Second Contact Pager: [phone]   Chief Complaint: Weakness and shortness of breath  History of Present Illness:  [name] is a 79 year old male with past medical history significant for chronic macrocytic anemia, diastolic CHF (last EF of 11-57% on 08/2018), persistent atrial fibrillation (s/p pacemaker, on Eliquis), cirrhosis, HTN, HLD, CAD s/p CABG 2005, carotid artery disease, aortic stenosis s/p TAVR, prior CVA (right PCA), T2DM, and chronic use of 2L O2 via [place] who presented to the [facility] ED on 11/13 due to shortness of breath.  History obtained from patient and patient's wife at bedside. Patient reports a several month history of progressively worsening shortness of breath, particularly with activity and relieved with rest. In addition to the shortness of breath with activity, he endorses chronic fatigue, with occassional lightheadedness. He has been following closely with cardiology for these complaints and has had adjustments to his diuretic regimen from 37m lasix daily to twice daily and initiation of 2L supplemental home oxygen. Despite these attempted adjustments, patient's shortness of breath has been progressing and limiting his ability to ambulate safely. Patient was seen by cardiology yesterday due to complaints of continued shortness of breath despite taking increased dose of lasix 457mtwice daily for prior two days. He was noted to have a hemoglobin of 6.2 down from 9.5 seven months prior, creatinine of  2.32, and pro BNP of 9,423. Due to these findings, he was informed to hold his home Eliquis, and come to the ED for blood transfusion and consideration of repeat echocardiogram.  Of note, patient has had progressive macrocytic anemia for the past several years and was seen by Dr. BrRogue Bussingn 04/16/19. At that time, patient underwent significant workup including the following labs: CBC w/ Diff, smear, kappa/lamba light chains, multiple myeloma panel, zinc, copper, folate, iron and TIBC, vitamin B12, reticulocytes, LDH, haptoglobin, ferritin, and erythropoietin. Workup was unrevealing for an obvious cause of patient's macrocytic anemia and patient was offered a bone marrow biopsy and suggested to undergo colonoscopy, however patient declined further evaluation. His last hemoglobin check was at this time and noted to be 9.5 with macrocytosis of 101.7.  ED Course: On arrival to the ED, patient was hemodynamically stable, afebrile, saturating at 100% on home oxygen requirement of 2L. Initial labs: Hb 6.4, MCV 115; BMP with BUN of 57, Cr 2.48; fecal occult blood negative. EKG revealed ventricular paced rhythm with occasional supraventricular complexes. CXR revealed enlargement of cardiopericardial silhouette with pulmonary vascular congestion and probable interstitial edema. Anemia workup with Vitamin B12, folate, iron and TIBC, ferritin, and reticulocytes was ordered as well as a hepatic function panel. He was provided 1u of PRBCs and admitted to IMTS for further evaluation and management of his symptomatic anemia.  Meds: No current facility-administered medications on file prior to encounter.   Current Outpatient Medications on File Prior to Encounter  Medication  Sig Dispense Refill  . acetaminophen (TYLENOL) 650 MG CR tablet Take 1,300 mg by mouth every 8 (eight) hours as needed for pain.     Marland Kitchen amLODipine (NORVASC) 2.5 MG tablet Take 1 tablet (2.5 mg total) by mouth daily. 90 tablet 3  . apixaban  (ELIQUIS) 5 MG TABS tablet Take 1 tablet (5 mg total) by mouth every 12 (twelve) hours. 180 tablet 3  . benazepril (LOTENSIN) 40 MG tablet TAKE 1 TABLET BY MOUTH EVERY DAY (Patient taking differently: Take 40 mg by mouth daily. ) 90 tablet 3  . cyanocobalamin 1000 MCG tablet Take 1,000 mcg by mouth daily.     Marland Kitchen ezetimibe (ZETIA) 10 MG tablet TAKE 1 TABLET BY MOUTH EVERY DAY (Patient taking differently: Take 10 mg by mouth daily. ) 90 tablet 2  . furosemide (LASIX) 40 MG tablet Take 40 mg by mouth daily.     Marland Kitchen glimepiride (AMARYL) 2 MG tablet TAKE 1 TABLET (2 MG TOTAL) BY MOUTH DAILY WITH BREAKFAST (Patient taking differently: Take 2 mg by mouth daily with breakfast. ) 90 tablet 0  . hydrALAZINE (APRESOLINE) 25 MG tablet Take 1 tablet (25 mg total) by mouth 2 (two) times daily. 180 tablet 3  . hydrochlorothiazide (HYDRODIURIL) 25 MG tablet TAKE 1 TABLET BY MOUTH EVERY DAY IN THE MORNING (Patient taking differently: Take 25 mg by mouth daily. ) 90 tablet 0  . loratadine (CLARITIN) 10 MG tablet Take 10 mg by mouth daily.    Marland Kitchen omeprazole (PRILOSEC) 20 MG capsule Take 2 capsules (40 mg total) by mouth daily. 30 capsule 11  . Polyvinyl Alcohol-Povidone (REFRESH OP) Place 1 drop into both eyes daily as needed (for dry eyes).     . tamsulosin (FLOMAX) 0.4 MG CAPS capsule TAKE 1 CAPSULE BY MOUTH ONCE DAILY (Patient taking differently: Take 0.4 mg by mouth daily. ) 90 capsule 3   Allergies: Allergies as of 12/07/2019 - Review Complete 12/07/2019  Allergen Reaction Noted  . Niacin Other (See Comments) and Rash 06/07/2012  . Niacin and related Rash and Other (See Comments) 06/07/2012  . Metformin and related Diarrhea 08/25/2015  . Statins Other (See Comments) 08/25/2015   Past Medical History:  Diagnosis Date  . Anemia   . Carotid arterial disease ([place])   . CHF (congestive heart failure) ([place])   . Coronary artery disease   . Hearing loss   . HOH (hard of hearing)   . Hyperlipidemia   . Hypertension    . Nocturia   . Obesity   . Osteoarthritis    "BUE; BLE; right knee" (03/23/2017)  . Peripheral vision loss, bilateral    S/P 07/2015  . Permanent atrial fibrillation ([place])   . 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 Severe aortic stenosis   . Sinus drainage   . Stroke [facility]) 07/2015   "peripheral vision still bad out of left eye" (03/23/2017)  . Torn rotator cuff 2012   Right arm  . Type II diabetes mellitus ([place])   . Urinary frequency    Family History:  Family History  Problem Relation Age of Onset  . Heart attack Father   . Heart attack Brother   . Parkinsonism Brother   . Cancer Brother   . Arrhythmia Brother    Social History:  Social History   Tobacco Use  . Smoking status: Former Smoker    Packs/day: 2.00    Years: 39.00    Pack years: 78.00    Types: Cigarettes  Quit date: 01/24/1994    Years since quitting: 25.8  . Smokeless tobacco: Never Used  Vaping Use  . Vaping Use: Never used  Substance Use Topics  . Alcohol use: No  . Drug use: No   Review of Systems: A complete ROS was negative except as per HPI.  Physical Exam: Blood pressure 131/64, pulse 64, temperature (!) 97.3 F (36.3 C), temperature source Oral, resp. rate 18, height 6' (1.829 m), weight 125.2 kg, SpO2 99 %. Physical Exam Vitals and nursing note reviewed.  Constitutional:      General: He is not in acute distress.    Appearance: He is obese.  HENT:     Head: Normocephalic and atraumatic.  Cardiovascular:     Rate and Rhythm: Normal rate and regular rhythm.  Pulmonary:     Effort: Pulmonary effort is normal. No tachypnea, accessory muscle usage or respiratory distress.     Comments: Rhonchi in bilateral lower lungs Abdominal:     General: Bowel sounds are normal.     Palpations: Abdomen is soft.  Musculoskeletal:        General: Normal range of motion.     Cervical back: Normal range of motion and neck supple.     Right lower leg: Edema present.     Left lower  leg: Edema present.     Comments: 2+ bilateral pitting edema of lower extremities to the knees  Skin:    General: Skin is warm and dry.     Capillary Refill: Capillary refill takes less than 2 seconds.     Coloration: Skin is pale.  Neurological:     General: No focal deficit present.     Mental Status: He is alert and oriented to person, place, and time.  Psychiatric:        Mood and Affect: Mood normal.        Behavior: Behavior normal.    EKG: personally reviewed my interpretation is ventricular paced rhythm with occasional supraventricular complexes  CXR: personally reviewed my interpretation is enlargement of cardiopericardial silhouette with pulmonary vascular congestion and probable interstitial edema  Assessment & Plan by Problem: Active Problems:   Symptomatic anemia  Mr. Nollie Terlizzi is a 79 year old male with past medical history significant for chronic macrocytic anemia, diastolic CHF (last EF of 64-40% on 08/2018), persistent atrial fibrillation (s/p pacemaker, on Eliquis), cirrhosis, HTN, HLD, CAD s/p CABG 2005, carotid artery disease, aortic stenosis s/p TAVR, prior CVA (right PCA), T2DM, and chronic use of 2L O2 via [place] who presented to the [facility] ED on 11/13 due to shortness of breath found to have progression of his chronic macrocytic anemia, acute on chronic CHF exacerbation as well as acute on chronic kidney disease.   #Symptomatic macrocytic anemia, active Patient presenting with symptomatic macrocytic anemia (Hb 6.4, MCV 115) due to an unclear etiology. Patient underwent significant laboratory workup with Dr. Rogue Bussing (as described in the HPI) for this chronic condition in March of 2021 with recommendation for patient to undergo colonoscopy and bone marrow biopsy for further evaluation of this condition as it was clearly progressive in nature. Patient deferred these procedures and had not had repeat hemoglobin check until yesterday with cardiology where he was  noted to have progression of his macrocytic anemia to 6.2 down from prior reading of 9.5. Patient's anemia is likely to be significantly contributing to his shortness of breath, lightheadedness, and fatigue, however his coexisting CHF is likely a large factor as well. He is receiving one  unit of blood currently in the ED and would likely benefit from continued evaluation of this condition with consideration of inpatient GI evaluation and continued follow-up with Dr. Rogue Bussing for consideration of bone marrow biopsy. -Follow-up post-transfusion H&H -Follow-up ferritin, folate, iron and TIBC, vitamin B12, reticulocytes -Haptoglobin, smear, LDH -Continue home vitamin B12 supplementation, although most recently B12 level noted to be mildly elevated -Continue home protonix 98m daily -Daily CBC -GI consulted, appreciate recommendations -Follow-up with Dr. BRogue Bussingin outpatient setting  #Acute on chronic diastolic heart failure exacerbation, active Patient with history of diastolic CHF with last echocardiogram in August 2020 revealing EF of 55-60%. He has been following closely with cardiology for management of this condition and was previously on 451mlasix daily with recent attempts to uptitrate his lasix dose to 4037mwice daily, however patient experienced acute kidney injury. For the past three days, he has been taking 77m34mice daily due to worsening shortness of breath and recent weight gain. His BNP is significantly elevated to 9,423 with radiologic evidence of pulmonary vascular congestion and interstitial edema. He would benefit from further diuresis for his acute heart failure exacerbation and cardiology consultation. 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 Unclear whether patient's elevated creatinine secondary to progression of his CKD versus acute injury. -Daily BMP, monitor creatinine -Avoid nephrotoxins  #Atrial fibrillation s/p pacemaker, chronic Patient with history of persistent atrial fibrillation s/p pacemaker placement in normal sinus rhythm on admission. Patient's last dose of Eliquis was yesterday evening. Currently holding in the setting of possible GI bleed. -Holding home Eliquis 5mg t36me daily -Telemetry  #HTN, chronic Patient's home blood pressure medication regimen includes amlodipine 2.5mg da63m, benazepril 77mg da74m hydralazine 25mg twi34maily, and HCTZ 25mg dail87mContinue amlodipine 2.5mg daily 79mntinue benazepril 77mg daily 69mtinue hydralazine 25mg twice d24m -Continue HCTZ 25mg daily  #28mhosis, chronic Patient noted to have shrunken appearance and nodular contour of the liver suggestive of cirrhosis on CTA Abdomen Pelvis in March 2019. Patient has no history of alcoholism and hepatitis B and C laboratory workup was unremarkable. -Hepatic Function Panel pending -GI consulted, appreciate recommendations  #T2DM, chronic Patient with history of T2DM, last A1c two years prior of 6.8. -Obtain repeat HbA1c -Moderate SSI -CBG monitoring 4 times daily, before meals and at bedtime  #HLD, chronic -Continue home zetia 10mg daily  #C14mstatus: Full code #VTE ppx: SCDs #Diet: Carb modified #IVF: None #Bowel regimen: None  Dispo: Admit patient to Inpatient with expected length of stay greater than 2 midnights.  Signed: Charlann Wayne, MattheCato Mulligan, 2:35 PM  Pager: [phone] [phone] and 1pm on weekends: On Call pager: [phone]

## 2019-12-07 NOTE — Consult Note (Signed)
Cardiology Consultation:   Patient ID: Evan [name] MRN: [id number]; DOB: 1940/02/03  Admit date: 12/07/2019 Date of Consult: 12/07/2019  Primary Care Provider: Leonel Ramsay, MD [facility] Cardiologist: [name] [name], MD  [facility] Electrophysiologist:  Will Meredith Leeds, MD    Patient Profile:   Evan [name] is a 79 y.o. male with a hx of coronary artery disease status post coronary artery bypass and graft, hypertension, hyperlipidemia, permanent atrial fibrillation, diabetes mellitus, previous pacemaker, previous TAVR, carotid artery disease who is being seen today for the evaluation of acute on chronic diastolic congestive heart failure at the request of Cato Mulligan, MD.  History of Present Illness:   Most recent cardiac catheterization in 2019 showed severe 3 vessel coronary artery disease with patent LIMA to the LAD, patent radial graft to the OM with tight stenosis at the distal anastomosis involving a small caliber vessel, patent saphenous vein graft to the PDA.  He had TAVR at that time.  Last echocardiogram August 2020 showed normal LV function, moderate left ventricular hypertrophy, severe biatrial enlargement, status post TAVR with mean gradient 17 mmHg and no aortic insufficiency.  Patient is also status post pacemaker in 2019.  Chest seen yesterday in the office with complaints of increasing dyspnea.  It was recommended that he continue on Lasix 40 mg twice daily which had been recently increased.  proBNP was 9423.  Hemoglobin 6.4.  Patient states that for the past 6 months he has had worsening dyspnea on exertion.  No orthopnea, PND, chest pain or syncope.  He has noticed bilateral lower extremity edema for the past 1 month.  He has gained 5 pounds.  No fevers or hemoptysis.  No melena or hematochezia.  Patient admitted by primary care and cardiology asked to evaluate.   Past Medical History:  Diagnosis Date  . Anemia   . Carotid arterial disease ([place])    . CHF (congestive heart failure) ([place])   . Coronary artery disease   . Hearing loss   . HOH (hard of hearing)   . Hyperlipidemia   . Hypertension   . Nocturia   . Obesity   . Osteoarthritis    "BUE; BLE; right knee" (03/23/2017)  . Peripheral vision loss, bilateral    S/P 07/2015  . Permanent atrial fibrillation ([place])   . 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 Severe aortic stenosis   . Sinus drainage   . Stroke [facility]) 07/2015   "peripheral vision still bad out of left eye" (03/23/2017)  . Torn rotator cuff 2012   Right arm  . Type II diabetes mellitus ([place])   . Urinary frequency     Past Surgical History:  Procedure Laterality Date  . BACK SURGERY    . CARDIAC CATHETERIZATION  11/29/2005   SEVERE THREE VESSEL OBSTRUCTIVE ATHERSCLEROTIC CAD. ALL GRAFTS WERE PATENT. EF 65%  . CARDIAC CATHETERIZATION  03/23/2017  . CAROTID ENDARTERECTOMY Right 01-29-08   cea  . CARPAL TUNNEL RELEASE Right   . CORONARY ARTERY BYPASS GRAFT  03/2003   X3. LIMA GRAFT TO THE LAD, SAPHENOUS VEIN GRAFT TO THE PA, AND A LEFT RADIAL GRAFT TO THEOBTUSE MARGINAL VESSEL  . ENDARTERECTOMY Left 05/28/2012   Procedure: ENDARTERECTOMY CAROTID;  Surgeon: Elam Dutch, MD;  Location: [place];  Service: Vascular;  Laterality: Left;  . INSERT / REPLACE / REMOVE PACEMAKER  03/23/2017  . [place]; 1984  . MOLE SURGERY     "? face/arms"  .  PACEMAKER IMPLANT N/A 03/23/2017   Procedure: PACEMAKER IMPLANT;  Surgeon: Constance Haw, MD;  Location: [place] CV LAB;  Service: Cardiovascular;  Laterality: N/A;  . PATCH ANGIOPLASTY Left 05/28/2012   Procedure: WITH dACRON PATCH ANGIOPLASTY;  Surgeon: Elam Dutch, MD;  Location: [place];  Service: Vascular;  Laterality: Left;  . RIGHT/LEFT HEART CATH AND CORONARY/GRAFT ANGIOGRAPHY N/A 03/23/2017   Procedure: RIGHT/LEFT HEART CATH AND CORONARY/GRAFT ANGIOGRAPHY;  Surgeon: Sherren Mocha, MD;  Location: [place] CV LAB;  Service:  Cardiovascular;  Laterality: N/A;  . TEE WITHOUT CARDIOVERSION N/A 05/09/2017   Procedure: TRANSESOPHAGEAL ECHOCARDIOGRAM (TEE);  Surgeon: Sherren Mocha, MD;  Location: [place];  Service: Open Heart Surgery;  Laterality: N/A;  . TRANSCATHETER AORTIC VALVE REPLACEMENT, TRANSFEMORAL N/A 05/09/2017   Procedure: TRANSCATHETER AORTIC VALVE REPLACEMENT, TRANSFEMORAL using an Edwards 44mm Sapien 3 Aortic Valve;  Surgeon: Sherren Mocha, MD;  Location: [place];  Service: Open Heart Surgery;  Laterality: N/A;      Inpatient Medications: Scheduled Meds: . [START ON 12/08/2019] amLODipine  2.5 mg Oral Daily  . apixaban  5 mg Oral Q12H  . [START ON 12/08/2019] benazepril  40 mg Oral Daily  . ezetimibe  10 mg Oral Daily  . furosemide  40 mg Intravenous BID  . hydrALAZINE  25 mg Oral BID  . [START ON 12/08/2019] hydrochlorothiazide  25 mg Oral Daily  . insulin aspart  0-15 Units Subcutaneous TID WC  . pantoprazole  40 mg Oral Daily  . tamsulosin  0.4 mg Oral Daily  . [START ON 12/08/2019] cyanocobalamin  1,000 mcg Oral Daily   Continuous Infusions:  PRN Meds: acetaminophen **OR** acetaminophen  Allergies:    Allergies  Allergen Reactions  . Niacin Other (See Comments) and Rash    Red face and burns.  . Niacin And Related Rash and Other (See Comments)    Red face and burns.  . Metformin And Related Diarrhea  . Statins Other (See Comments)    Myalgias, weakness    Social History:   Social History   Socioeconomic History  . Marital status: Married    Spouse name: Not on file  . Number of children: 5  . Years of education: Not on file  . Highest education level: Not on file  Occupational History  . Occupation: retired    Fish farm manager: RETIRED  Tobacco Use  . Smoking status: Former Smoker    Packs/day: 2.00    Years: 39.00    Pack years: 78.00    Types: Cigarettes    Quit date: 01/24/1994    Years since quitting: 25.8  . Smokeless tobacco: Never Used  Vaping Use  . Vaping Use: Never  used  Substance and Sexual Activity  . Alcohol use: No  . Drug use: No  . Sexual activity: Never  Other Topics Concern  . Not on file  Social History Narrative   On Farm/ in [place]; no alcohol; quit smoking- 96; worked in Estate agent.    Social Determinants of Health   Financial Resource Strain:   . Difficulty of Paying Living Expenses: Not on file  Food Insecurity:   . Worried About Charity fundraiser in the Last Year: Not on file  . Ran Out of Food in the Last Year: Not on file  Transportation Needs:   . Lack of Transportation (Medical): Not on file  . Lack of Transportation (Non-Medical): Not on file  Physical Activity:   . Days of Exercise per Week: Not  on file  . Minutes of Exercise per Session: Not on file  Stress:   . Feeling of Stress : Not on file  Social Connections:   . Frequency of Communication with Friends and Family: Not on file  . Frequency of Social Gatherings with Friends and Family: Not on file  . Attends Religious Services: Not on file  . Active Member of Clubs or Organizations: Not on file  . Attends Archivist Meetings: Not on file  . Marital Status: Not on file  Intimate Partner Violence:   . Fear of Current or Ex-Partner: Not on file  . Emotionally Abused: Not on file  . Physically Abused: Not on file  . Sexually Abused: Not on file    Family History:    Family History  Problem Relation Age of Onset  . Heart attack Father   . Heart attack Brother   . Parkinsonism Brother   . Cancer Brother   . Arrhythmia Brother      ROS:  Please see the history of present illness.  No melena or hematochezia.  No hematemesis. All other ROS reviewed and negative.     Physical Exam/Data:   Vitals:   12/07/19 1202 12/07/19 1315 12/07/19 1410 12/07/19 1500  BP: 117/64 130/64 131/64 (!) 143/65  Pulse: 68 64 64 65  Resp: 16 15 18 19   Temp: (!) 97.3 F (36.3 C)  (!) 97.3 F (36.3 C)   TempSrc: Oral  Oral   SpO2: 100% 99% 99%  96%  Weight:      Height:        Intake/Output Summary (Last 24 hours) at 12/07/2019 1547 Last data filed at 12/07/2019 1410 Gross per 24 hour  Intake 315 ml  Output --  Net 315 ml   Last 3 Weights 12/07/2019 12/06/2019 10/17/2019  Weight (lbs) 276 lb 274 lb 12.8 oz 268 lb 12.8 oz  Weight (kg) 125.193 kg 124.648 kg 121.927 kg     Body mass index is 37.43 kg/m.  General:  Obese, well developed, in no acute distress HEENT: normal Lymph: no adenopathy Neck: no JVD Endocrine:  No thryomegaly Vascular: No carotid bruits; FA pulses 2+ bilaterally without bruits  Cardiac:  normal S1, S2; RRR; no murmur  Lungs:  clear to auscultation bilaterally, no wheezing, rhonchi or rales  Abd: soft, nontender, no hepatomegaly  Ext: 1+ edema Musculoskeletal:  No deformities, BUE and BLE strength normal and equal Skin: warm and dry  Neuro:  CNs 2-12 intact, no focal abnormalities noted Psych:  Normal affect   EKG:  The EKG was personally reviewed and demonstrates: December 07, 2019-ventricular pacing with underlying atrial fibrillation.       Laboratory Data:  Chemistry Recent Labs  Lab 12/06/19 1630 12/07/19 1015  NA 141 139  K 4.4 3.9  CL 98 99  CO2 28 28  GLUCOSE 155* 196*  BUN 54* 57*  CREATININE 2.32* 2.48*  CALCIUM 9.0 9.0  GFRNONAA 26* 26*  GFRAA 30*  --   ANIONGAP  --  12    Hematology Recent Labs  Lab 12/06/19 1630 12/07/19 1015  WBC 5.5 5.7  RBC 1.84* 1.87*  HGB 6.2* 6.4*  HCT 19.6* 21.5*  MCV 107* 115.0*  MCH 33.7* 34.2*  MCHC 31.6 29.8*  RDW 16.9* 18.7*  PLT 287 265   BNP Recent Labs  Lab 12/06/19 1633  PROBNP 9,423*     Radiology/Studies:  DG Chest 2 View  Result Date: 12/07/2019 CLINICAL DATA:  Shortness of  breath. EXAM: CHEST - 2 VIEW COMPARISON:  03/29/2018 FINDINGS: 1016 hours. The cardio pericardial silhouette is enlarged. There is pulmonary vascular congestion without overt pulmonary edema. Diffuse interstitial opacity suggest component  of underlying interstitial edema. Left single lead permanent pacemaker noted. Bones are demineralized. IMPRESSION: Enlargement of the cardiopericardial silhouette with pulmonary vascular congestion and probable interstitial edema. Electronically Signed   By: Misty Stanley M.D.   On: 12/07/2019 10:40     Assessment and Plan:   1. Acute on chronic diastolic congestive heart failure-patient is mildly volume overloaded on examination.  He has significant anemia which is likely the precipitating factor.  We will check echocardiogram for LV function.  We will gently diurese with Lasix 40 mg IV twice daily and follow renal function closely.  Symptoms should also improve with transfusion/correction of anemia. 2. Anemia-macrocytic.  Agree with anemia laboratories.  Also given history of cirrhosis gastroenterology has been consulted and plans EGD and colonoscopy.  Would transfuse to hemoglobin of 9. 3. Acute on chronic stage III kidney disease-likely related to anemia as well.  Transfuse and continue Lasix as outlined above.  Follow renal function closely.  Will hold ACE inhibitor for now.  Would also hold HCTZ given we are diuresing with Lasix. 4. Permanent atrial fibrillation-patient's heart rate is controlled on no medications.  Given new anemia would hold apixaban until it is clear there is no GI blood loss.  He is also scheduled for EGD and colonoscopy and will need to be off of anticoagulation for potential intervention. 5. Prior pacemaker 6. History of cirrhosis-Per gastroenterology. 7. Hypertension-hold ACE inhibitor in setting of worsening renal function.  Will resume as it improves.  Follow blood pressure and adjust regimen as needed.  We will continue hydralazine for now. 8. Status post TAVR-repeat echocardiogram.   For questions or updates, please contact [place] Please consult [email or web address] for contact info under    Signed, Kirk Ruths, MD  12/07/2019 3:47 PM

## 2019-12-07 NOTE — ED Notes (Signed)
Patient transported to Ultrasound 

## 2019-12-08 ENCOUNTER — Inpatient Hospital Stay (HOSPITAL_COMMUNITY): Payer: Medicare Other

## 2019-12-08 DIAGNOSIS — D649 Anemia, unspecified: Secondary | ICD-10-CM | POA: Diagnosis not present

## 2019-12-08 DIAGNOSIS — E1122 Type 2 diabetes mellitus with diabetic chronic kidney disease: Secondary | ICD-10-CM

## 2019-12-08 DIAGNOSIS — D539 Nutritional anemia, unspecified: Secondary | ICD-10-CM

## 2019-12-08 DIAGNOSIS — I130 Hypertensive heart and chronic kidney disease with heart failure and stage 1 through stage 4 chronic kidney disease, or unspecified chronic kidney disease: Secondary | ICD-10-CM

## 2019-12-08 DIAGNOSIS — I5033 Acute on chronic diastolic (congestive) heart failure: Secondary | ICD-10-CM | POA: Diagnosis not present

## 2019-12-08 DIAGNOSIS — N179 Acute kidney failure, unspecified: Secondary | ICD-10-CM | POA: Diagnosis not present

## 2019-12-08 DIAGNOSIS — K7460 Unspecified cirrhosis of liver: Secondary | ICD-10-CM | POA: Diagnosis not present

## 2019-12-08 DIAGNOSIS — Z7901 Long term (current) use of anticoagulants: Secondary | ICD-10-CM

## 2019-12-08 DIAGNOSIS — I4819 Other persistent atrial fibrillation: Secondary | ICD-10-CM

## 2019-12-08 DIAGNOSIS — Z950 Presence of cardiac pacemaker: Secondary | ICD-10-CM

## 2019-12-08 DIAGNOSIS — R0602 Shortness of breath: Secondary | ICD-10-CM | POA: Diagnosis not present

## 2019-12-08 DIAGNOSIS — R600 Localized edema: Secondary | ICD-10-CM

## 2019-12-08 LAB — CBC
HCT: 27.8 % — ABNORMAL LOW (ref 39.0–52.0)
HCT: 28.3 % — ABNORMAL LOW (ref 39.0–52.0)
Hemoglobin: 8.8 g/dL — ABNORMAL LOW (ref 13.0–17.0)
Hemoglobin: 8.8 g/dL — ABNORMAL LOW (ref 13.0–17.0)
MCH: 32.7 pg (ref 26.0–34.0)
MCH: 32.2 pg (ref 26.0–34.0)
MCHC: 31.7 g/dL (ref 30.0–36.0)
MCHC: 31.1 g/dL (ref 30.0–36.0)
MCV: 103.3 fL — ABNORMAL HIGH (ref 80.0–100.0)
MCV: 103.7 fL — ABNORMAL HIGH (ref 80.0–100.0)
Platelets: 266 10*3/uL (ref 150–400)
Platelets: 262 10*3/uL (ref 150–400)
RBC: 2.69 MIL/uL — ABNORMAL LOW (ref 4.22–5.81)
RBC: 2.73 MIL/uL — ABNORMAL LOW (ref 4.22–5.81)
RDW: 22.5 % — ABNORMAL HIGH (ref 11.5–15.5)
RDW: 22.3 % — ABNORMAL HIGH (ref 11.5–15.5)
WBC: 5.7 10*3/uL (ref 4.0–10.5)
WBC: 5.9 10*3/uL (ref 4.0–10.5)
nRBC: 0 % (ref 0.0–0.2)
nRBC: 0 % (ref 0.0–0.2)

## 2019-12-08 LAB — COMPREHENSIVE METABOLIC PANEL
ALT: 18 U/L (ref 0–44)
AST: 16 U/L (ref 15–41)
Albumin: 3.9 g/dL (ref 3.5–5.0)
Alkaline Phosphatase: 128 U/L — ABNORMAL HIGH (ref 38–126)
Anion gap: 12 (ref 5–15)
BUN: 53 mg/dL — ABNORMAL HIGH (ref 8–23)
CO2: 28 mmol/L (ref 22–32)
Calcium: 9.4 mg/dL (ref 8.9–10.3)
Chloride: 99 mmol/L (ref 98–111)
Creatinine, Ser: 2.25 mg/dL — ABNORMAL HIGH (ref 0.61–1.24)
GFR, Estimated: 29 mL/min — ABNORMAL LOW (ref >60)
Glucose, Bld: 147 mg/dL — ABNORMAL HIGH (ref 70–99)
Potassium: 4.1 mmol/L (ref 3.5–5.1)
Sodium: 139 mmol/L (ref 135–145)
Total Bilirubin: 3.1 mg/dL — ABNORMAL HIGH (ref 0.3–1.2)
Total Protein: 6.6 g/dL (ref 6.5–8.1)

## 2019-12-08 LAB — ECHOCARDIOGRAM COMPLETE
AR max vel: 1.46 cm2
AV Area VTI: 1.44 cm2
AV Area mean vel: 1.28 cm2
AV Mean grad: 11 mmHg
AV Peak grad: 20.1 mmHg
Ao pk vel: 2.24 m/s
Area-P 1/2: 4.49 cm2
Height: 72 in
S' Lateral: 4.7 cm
Weight: 4326.4 oz

## 2019-12-08 LAB — GLUCOSE, CAPILLARY
Glucose-Capillary: 146 mg/dL — ABNORMAL HIGH (ref 70–99)
Glucose-Capillary: 177 mg/dL — ABNORMAL HIGH (ref 70–99)
Glucose-Capillary: 111 mg/dL — ABNORMAL HIGH (ref 70–99)
Glucose-Capillary: 147 mg/dL — ABNORMAL HIGH (ref 70–99)

## 2019-12-08 LAB — PROTIME-INR
INR: 1.4 — ABNORMAL HIGH (ref 0.8–1.2)
Prothrombin Time: 16.4 seconds — ABNORMAL HIGH (ref 11.4–15.2)

## 2019-12-08 MED ORDER — FUROSEMIDE 10 MG/ML IJ SOLN
40.0000 mg | Freq: Every day | INTRAMUSCULAR | Status: DC
  Administered 2019-12-09 – 2019-12-12 (×4): 40 mg via INTRAVENOUS
  Filled 2019-12-08 (×4): qty 4

## 2019-12-08 NOTE — Progress Notes (Signed)
Patient arrived to 3E07 from [facility]. A&Ox4. No complaints of pain. VSS. No skin break down noted. Heart failure and general care plan initiated

## 2019-12-08 NOTE — Progress Notes (Signed)
Subjective: No abdominal pain.  Objective: Vital signs in last 24 hours: Temp:  [97.3 F (36.3 C)-98.6 F (37 C)] 97.9 F (36.6 C) (11/14 0540) Pulse Rate:  [64-93] 66 (11/14 0540) Resp:  [14-20] 20 (11/14 0540) BP: (106-143)/(46-75) 141/64 (11/14 0540) SpO2:  [94 %-100 %] 97 % (11/14 0540) Weight:  [122.3 kg-122.7 kg] 122.7 kg (11/14 0500) Weight change:  Last BM Date: 12/08/19  PE: GEN:  Chronically ill-appearing, overweight SKIN:  Jaundiced, scattered ecchymoses ABD:  Protuberant, soft, non-tender EXT:  1-2+ pitting edema bilateral lower extremities  Lab Results: CBC    Component Value Date/Time   WBC 5.7 12/08/2019 0749   RBC 2.69 (L) 12/08/2019 0749   HGB 8.8 (L) 12/08/2019 0749   HGB 6.2 (LL) 12/06/2019 1630   HCT 27.8 (L) 12/08/2019 0749   HCT 19.6 (L) 12/06/2019 1630   PLT 266 12/08/2019 0749   PLT 287 12/06/2019 1630   MCV 103.3 (H) 12/08/2019 0749   MCV 107 (H) 12/06/2019 1630   MCH 32.7 12/08/2019 0749   MCHC 31.7 12/08/2019 0749   RDW 22.5 (H) 12/08/2019 0749   RDW 16.9 (H) 12/06/2019 1630   LYMPHSABS 0.9 04/16/2019 1152   LYMPHSABS 1.0 03/02/2018 1205   MONOABS 0.3 04/16/2019 1152   EOSABS 0.2 04/16/2019 1152   EOSABS 0.2 03/02/2018 1205   BASOSABS 0.1 04/16/2019 1152   BASOSABS 0.0 03/02/2018 1205   CMP     Component Value Date/Time   NA 139 12/08/2019 0749   NA 141 12/06/2019 1630   K 4.1 12/08/2019 0749   CL 99 12/08/2019 0749   CO2 28 12/08/2019 0749   GLUCOSE 147 (H) 12/08/2019 0749   BUN 53 (H) 12/08/2019 0749   BUN 54 (H) 12/06/2019 1630   CREATININE 2.25 (H) 12/08/2019 0749   CREATININE 0.96 12/11/2015 0854   CALCIUM 9.4 12/08/2019 0749   PROT 6.6 12/08/2019 0749   PROT 7.0 08/06/2018 1159   ALBUMIN 3.9 12/08/2019 0749   ALBUMIN 4.6 08/06/2018 1159   AST 16 12/08/2019 0749   ALT 18 12/08/2019 0749   ALKPHOS 128 (H) 12/08/2019 0749   BILITOT 3.1 (H) 12/08/2019 0749   BILITOT 0.7 08/06/2018 1159   GFRNONAA 29 (L) 12/08/2019  0749   GFRAA 30 (L) 12/06/2019 1630   Assessment:  1.  Anemia without overt GI bleeding.  Macrocytosis.  Unclear if GI in origin. 2.  Cirrhosis.  U/S yesterday no liver lesion. 3.  Multiple cardiac comorbidities. 4.  Cardiology consultation in process; need cardiology clearance prior to consideration of endoscopy/colonoscopy.  Plan:  1.  Medical management of heart failure, anemia. 2.  Awaiting variety of liver serologies. 3.  Consider endoscopy/colonoscopy this admission (most likely, if cardiology clearance obtained, could do prep tomorrow for procedures Tuesday). 4.  [facility] will follow.   Landry Dyke 12/08/2019, 11:32 AM   Cell [phone] If no answer or after 5 PM call [phone]

## 2019-12-08 NOTE — Consult Note (Signed)
[place]  Telephone:([phone]) [place]   Evan [name]  DOB: 1940/07/08  MR#: [id number]  CSN#: [id number]    Requesting Physician: [facility]  Patient Care Team: Leonel Ramsay, MD as PCP - General (Infectious Diseases) [name], [name] M, MD as PCP - Cardiology (Cardiology) Constance Haw, MD as PCP - Electrophysiology (Cardiology) Cammie Sickle, MD as Consulting Physician (Hematology and Oncology)  Reason for consult: anemia   History of present illness:   Patient is a 79 year old Caucasian male, with extensive heart disease, anemia, was admitted for worsening anemia and CHF exacerbation.  He has had a chronic mild to moderate anemia since 2019, with Hb around 9-10, macrocytic. He was seen by Dr. Rogue Bussing in March 2021, anemia work-up was negative for megaloblastic anemia or other nutritional anemia, multiple myeloma etc. bone marrow transplant was recommended, but patient declined. He has not followed with Dr. Rogue Bussing for anemia since then, but has been checking blood counts with his cardiologist, per his wife. His hemoglobin has been stable until few weeks, he developed worsening shortness of breath and leg edema. He was seen by cardiology service again, was found to have hemoglobin 6.2, and was directed to emergency room for admission. He denies any overt GI bleeding. Of note, he has a history of liver cirrhosis, unclear etiology, has not been seen by GI or liver clinic, and has not had endoscopy. He denies overt GI bleeding. Patient is very hard of hearing, most history from chart and his wife.  Patient has history of coronary artery disease, status post CABG in 2005, carotid artery disease, hypertension, type 2 diabetes, permanent atrial fibrillation on anticoagulation, pacemaker, severe aortic stenosis status post TAVR.   MEDICAL HISTORY:  Past Medical History:  Diagnosis Date  . Anemia   .  Carotid arterial disease ([place])   . CHF (congestive heart failure) ([place])   . Coronary artery disease   . Hearing loss   . HOH (hard of hearing)   . Hyperlipidemia   . Hypertension   . Nocturia   . Obesity   . Osteoarthritis    "BUE; BLE; right knee" (03/23/2017)  . Peripheral vision loss, bilateral    S/P 07/2015  . Permanent atrial fibrillation ([place])   . 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 Severe aortic stenosis   . Sinus drainage   . Stroke [facility]) 07/2015   "peripheral vision still bad out of left eye" (03/23/2017)  . Torn rotator cuff 2012   Right arm  . Type II diabetes mellitus ([place])   . Urinary frequency     SURGICAL HISTORY: Past Surgical History:  Procedure Laterality Date  . BACK SURGERY    . CARDIAC CATHETERIZATION  11/29/2005   SEVERE THREE VESSEL OBSTRUCTIVE ATHERSCLEROTIC CAD. ALL GRAFTS WERE PATENT. EF 65%  . CARDIAC CATHETERIZATION  03/23/2017  . CAROTID ENDARTERECTOMY Right 01-29-08   cea  . CARPAL TUNNEL RELEASE Right   . CORONARY ARTERY BYPASS GRAFT  03/2003   X3. LIMA GRAFT TO THE LAD, SAPHENOUS VEIN GRAFT TO THE PA, AND A LEFT RADIAL GRAFT TO THEOBTUSE MARGINAL VESSEL  . ENDARTERECTOMY Left 05/28/2012   Procedure: ENDARTERECTOMY CAROTID;  Surgeon: Elam Dutch, MD;  Location: [place];  Service: Vascular;  Laterality: Left;  . INSERT / REPLACE / REMOVE PACEMAKER  03/23/2017  . [place]; 1984  . MOLE SURGERY     "? face/arms"  . PACEMAKER IMPLANT  N/A 03/23/2017   Procedure: PACEMAKER IMPLANT;  Surgeon: Constance Haw, MD;  Location: [place] CV LAB;  Service: Cardiovascular;  Laterality: N/A;  . PATCH ANGIOPLASTY Left 05/28/2012   Procedure: WITH dACRON PATCH ANGIOPLASTY;  Surgeon: Elam Dutch, MD;  Location: [place];  Service: Vascular;  Laterality: Left;  . RIGHT/LEFT HEART CATH AND CORONARY/GRAFT ANGIOGRAPHY N/A 03/23/2017   Procedure: RIGHT/LEFT HEART CATH AND CORONARY/GRAFT ANGIOGRAPHY;  Surgeon: Sherren Mocha, MD;  Location: [place] CV LAB;  Service: Cardiovascular;  Laterality: N/A;  . TEE WITHOUT CARDIOVERSION N/A 05/09/2017   Procedure: TRANSESOPHAGEAL ECHOCARDIOGRAM (TEE);  Surgeon: Sherren Mocha, MD;  Location: [place];  Service: Open Heart Surgery;  Laterality: N/A;  . TRANSCATHETER AORTIC VALVE REPLACEMENT, TRANSFEMORAL N/A 05/09/2017   Procedure: TRANSCATHETER AORTIC VALVE REPLACEMENT, TRANSFEMORAL using an Edwards 49m Sapien 3 Aortic Valve;  Surgeon: CSherren Mocha MD;  Location: [place]  Service: Open Heart Surgery;  Laterality: N/A;    SOCIAL HISTORY: Social History   Socioeconomic History  . Marital status: Married    Spouse name: Not on file  . Number of children: 5  . Years of education: Not on file  . Highest education level: Not on file  Occupational History  . Occupation: retired    [employer] farm manager RETIRED  Tobacco Use  . Smoking status: Former Smoker    Packs/day: 2.00    Years: 39.00    Pack years: 78.00    Types: Cigarettes    Quit date: 01/24/1994    Years since quitting: 25.8  . Smokeless tobacco: Never Used  Vaping Use  . Vaping Use: Never used  Substance and Sexual Activity  . Alcohol use: No  . Drug use: No  . Sexual activity: Never  Other Topics Concern  . 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 Difficulty of Paying Living Expenses: Not on file  Food Insecurity:   . Worried About RCharity fundraiserin the Last Year: Not on file  . Ran Out of Food in the Last Year: Not on file  Transportation Needs:   . Lack of Transportation (Medical): Not on file  . Lack of Transportation (Non-Medical): Not on file  Physical Activity:   . Days of Exercise per Week: Not on file  . Minutes of Exercise per Session: Not on file  Stress:   . Feeling of Stress : Not on file  Social Connections:   . Frequency  of Communication with Friends and Family: Not on file  . Frequency of Social Gatherings with Friends and Family: Not on file  . Attends Religious Services: Not on file  . Active Member of Clubs or Organizations: Not on file  . Attends CArchivistMeetings: Not on file  . Marital Status: Not on file  Intimate Partner Violence:   . Fear of Current or Ex-Partner: Not on file  . Emotionally Abused: Not on file  . Physically Abused: Not on file  . Sexually Abused: Not on file    FAMILY HISTORY: Family History  Problem Relation Age of Onset  . Heart attack Father   . Heart attack Brother   . Parkinsonism Brother   . Cancer Brother   . Arrhythmia Brother     ALLERGIES:  is allergic to niacin, niacin and related, metformin and related, and statins.  MEDICATIONS:  Current Facility-Administered Medications  Medication Dose Route Frequency Provider Last Rate Last Admin  . acetaminophen (TYLENOL) tablet 650 mg  650 mg Oral Q6H PRN Christian, Rylee, MD       Or  . acetaminophen (TYLENOL) suppository 650 mg  650 mg Rectal Q6H PRN Christian, Rylee, MD      . amLODipine (NORVASC) tablet 2.5 mg  2.5 mg Oral Daily Christian, Rylee, MD   2.5 mg at 12/08/19 0938  . ezetimibe (ZETIA) tablet 10 mg  10 mg Oral Daily Darrick Meigs, Rylee, MD   10 mg at 12/08/19 0938  . [START ON 12/09/2019] furosemide (LASIX) injection 40 mg  40 mg Intravenous Daily Lelon Perla, MD      . hydrALAZINE (APRESOLINE) tablet 25 mg  25 mg Oral BID Mitzi Hansen, MD   25 mg at 12/08/19 2706  . insulin aspart (novoLOG) injection 0-15 Units  0-15 Units Subcutaneous TID WC Mitzi Hansen, MD   3 Units at 12/08/19 1213  . pantoprazole (PROTONIX) EC tablet 40 mg  40 mg Oral Daily Darrick Meigs, Rylee, MD   40 mg at 12/08/19 0937  . polyethylene glycol (MIRALAX / GLYCOLAX) packet 17 g  17 g Oral BID Ronnette Juniper, MD   17 g at 12/08/19 2376  . tamsulosin (FLOMAX) capsule 0.4 mg  0.4 mg Oral Daily Darrick Meigs, Rylee, MD    0.4 mg at 12/08/19 0938  . vitamin B-12 (CYANOCOBALAMIN) tablet 1,000 mcg  1,000 mcg Oral Daily Mitzi Hansen, MD   1,000 mcg at 12/08/19 2831    REVIEW OF SYSTEMS:   Constitutional: Denies fevers, chills or abnormal night sweats, (+) fatigue  Eyes: Denies blurriness of vision, double vision or watery eyes Ears, nose, mouth, throat, and face: Denies mucositis or sore throat Respiratory: Denies cough, (+) dyspnea on exertion Cardiovascular: Denies palpitation, chest discomfort, (+) low extremity swelling Gastrointestinal:  Denies nausea, heartburn or change in bowel habits Skin: Denies abnormal skin rashes Lymphatics: Denies new lymphadenopathy or easy bruising Neurological:Denies numbness, tingling or new weaknesses Behavioral/Psych: Mood is stable, no new changes  All other systems were reviewed with the patient and are negative.  PHYSICAL EXAMINATION: ECOG PERFORMANCE STATUS: 3 - Symptomatic, >50% confined to bed  Vitals:   12/08/19 1142 12/08/19 1643  BP: 135/63 (!) 137/54  Pulse: 65 68  Resp: 20 20  Temp: 97.8 F (36.6 C) 98 F (36.7 C)  SpO2: 98% 94%   Filed Weights   12/07/19 0959 12/07/19 2234 12/08/19 0500  Weight: 276 lb (125.2 kg) 269 lb 10 oz (122.3 kg) 270 lb 6.4 oz (122.7 kg)    GENERAL:alert, no distress and comfortable SKIN: skin color, texture, turgor are normal, no rashes or significant lesions EYES: normal, conjunctiva are pink and non-injected, sclera clear OROPHARYNX:no exudate, no erythema and lips, buccal mucosa, and tongue normal  NECK: supple, thyroid normal size, non-tender, without nodularity LYMPH:  no palpable lymphadenopathy in the cervical, axillary or inguinal LUNGS: clear to auscultation and percussion with normal breathing effort HEART: regular rate & rhythm and no murmurs and no lower extremity edema ABDOMEN:abdomen soft, non-tender and normal bowel sounds Musculoskeletal:no cyanosis of digits and no clubbing  PSYCH: alert & oriented x  3 with fluent speech NEURO: no focal motor/sensory deficits  LABORATORY DATA:  I have reviewed the data as listed Lab Results  Component Value Date   WBC 5.7 12/08/2019   HGB 8.8 (L) 12/08/2019   HCT 27.8 (L) 12/08/2019   MCV 103.3 (H) 12/08/2019  PLT 266 12/08/2019   Recent Labs    03/28/19 1416 03/28/19 1416 12/06/19 1630 12/07/19 1015 12/07/19 1844 12/08/19 0749  NA 143   < > 141 139  --  139  K 5.3*   < > 4.4 3.9  --  4.1  CL 105   < > 98 99  --  99  CO2 22   < > 28 28  --  28  GLUCOSE 183*   < > 155* 196*  --  147*  BUN 30*   < > 54* 57*  --  53*  CREATININE 1.49*   < > 2.32* 2.48*  --  2.25*  CALCIUM 9.0   < > 9.0 9.0  --  9.4  GFRNONAA 45*   < > 26* 26*  --  29*  GFRAA 52*  --  30*  --   --   --   PROT  --   --   --   --  6.6 6.6  ALBUMIN  --   --   --   --  3.8 3.9  AST  --   --   --   --  15 16  ALT  --   --   --   --  17 18  ALKPHOS  --   --   --   --  127* 128*  BILITOT  --   --   --   --  2.3* 3.1*  BILIDIR  --   --   --   --  0.7*  --   IBILI  --   --   --   --  1.6*  --    < > = values in this interval not displayed.    RADIOGRAPHIC STUDIES: I have personally reviewed the radiological images as listed and agreed with the findings in the report. DG Chest 2 View  Result Date: 12/07/2019 CLINICAL DATA:  Shortness of breath. EXAM: CHEST - 2 VIEW COMPARISON:  03/29/2018 FINDINGS: 1016 hours. The cardio pericardial silhouette is enlarged. There is pulmonary vascular congestion without overt pulmonary edema. Diffuse interstitial opacity suggest component of underlying interstitial edema. Left single lead permanent pacemaker noted. Bones are demineralized. IMPRESSION: Enlargement of the cardiopericardial silhouette with pulmonary vascular congestion and probable interstitial edema. Electronically Signed   By: Misty Stanley M.D.   On: 12/07/2019 10:40   ECHOCARDIOGRAM COMPLETE  Result Date: 12/08/2019    ECHOCARDIOGRAM REPORT   Patient Name:   SHANE BADEAUX [facility]  Date of Exam: 12/08/2019 Medical Rec #:  [id number]      Height:       72.0 in Accession #:    [id number]     Weight:       270.4 lb Date of Birth:  01-26-1940      BSA:          2.422 m Patient Age:    59 years       BP:           135/63 mmHg Patient Gender: M              HR:           65 bpm. Exam Location:  Inpatient Procedure: 2D Echo, Cardiac Doppler and Color Doppler Indications:    CHF  History:        Patient has prior history of Echocardiogram examinations, most                 recent 08/27/2018.  CHF, CAD, Pacemaker and Prior CABG, Stroke,                 Aortic Valve Disease and TAVR, Arrythmias:Atrial Fibrillation;                 Risk Factors:Hypertension, Dyslipidemia and Diabetes.  Sonographer:    Dustin Flock Referring Phys: [place]  1. Left ventricular ejection fraction, by estimation, is 45 to 50%. The left ventricle has mildly decreased function. The left ventricle demonstrates global hypokinesis. There is moderate concentric left ventricular hypertrophy. Left ventricular diastolic function could not be evaluated.  2. Right ventricular systolic function is moderately reduced. The right ventricular size is moderately enlarged. There is severely elevated pulmonary artery systolic pressure. The estimated right ventricular systolic pressure is 84.6 mmHg.  3. Left atrial size was severely dilated.  4. Right atrial size was moderately dilated.  5. The mitral valve is degenerative. Mild mitral valve regurgitation. Moderate to severe mitral annular calcification.  6. The aortic valve has been repaired/replaced. Aortic valve regurgitation is not visualized. Procedure Date: 04/29/2017. Echo findings are consistent with normal structure and function of the aortic valve prosthesis. Aortic valve mean gradient measures  11.0 mmHg. Aortic valve Vmax measures 2.24 m/s.  7. The inferior vena cava is dilated in size with <50% respiratory variability, suggesting right atrial pressure of  15 mmHg. Comparison(s): Prior images reviewed side by side. The left ventricular function is worsened. The right ventricular systolic function is worse. Pulmonary artery systolic pressure and right atrial pressure are higher. TAVR gradients are improved (possibly  due to lower cardiac output). FINDINGS  Left Ventricle: Left ventricular ejection fraction, by estimation, is 45 to 50%. The left ventricle has mildly decreased function. The left ventricle demonstrates global hypokinesis. The left ventricular internal cavity size was normal in size. There is  moderate concentric left ventricular hypertrophy. Abnormal (paradoxical) septal motion, consistent with RV pacemaker and abnormal (paradoxical) septal motion consistent with post-operative status. Left ventricular diastolic function could not be evaluated due to atrial fibrillation. Left ventricular diastolic function could not be evaluated. Right Ventricle: The right ventricular size is moderately enlarged. Right vetricular wall thickness was not well visualized. Right ventricular systolic function is moderately reduced. There is severely elevated pulmonary artery systolic pressure. The tricuspid regurgitant velocity is 3.68 m/s, and with an assumed right atrial pressure of 15 mmHg, the estimated right ventricular systolic pressure is 65.9 mmHg. Left Atrium: Left atrial size was severely dilated. Right Atrium: Right atrial size was moderately dilated. Pericardium: There is no evidence of pericardial effusion. Mitral Valve: The mitral valve is degenerative in appearance. Moderate to severe mitral annular calcification. Mild mitral valve regurgitation. Tricuspid Valve: The tricuspid valve is normal in structure. Tricuspid valve regurgitation is trivial. Aortic Valve: The aortic valve has been repaired/replaced. Aortic valve regurgitation is not visualized. Aortic valve mean gradient measures 11.0 mmHg. Aortic valve peak gradient measures 20.1 mmHg. Aortic valve area,  by VTI measures 1.44 cm. 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 Echo findings are consistent with normal structure and function of the aortic valve prosthesis. Pulmonic Valve: The pulmonic valve was grossly normal. Pulmonic valve regurgitation is not visualized. Aorta: The aortic root is normal in size and structure. Venous: The inferior vena cava is dilated in size with less than 50% respiratory variability, suggesting right atrial pressure of 15 mmHg. IAS/Shunts: No atrial level shunt detected by color flow Doppler. Additional Comments: A  pacer wire is visualized.  LEFT VENTRICLE PLAX 2D LVIDd:         5.50 cm  Diastology LVIDs:         4.70 cm  LV e' medial:    4.46 cm/s LV PW:         1.50 cm  LV E/e' medial:  31.2 LV IVS:        1.50 cm  LV e' lateral:   5.77 cm/s LVOT diam:     2.00 cm  LV E/e' lateral: 24.1 LV SV:         67 LV SV Index:   28 LVOT Area:     3.14 cm  RIGHT VENTRICLE RV Basal diam:  5.20 cm RV S prime:     4.46 cm/s TAPSE (M-mode): 3.2 cm LEFT ATRIUM             Index       RIGHT ATRIUM           Index LA diam:        5.10 cm 2.11 cm/m  RA Area:     25.40 cm LA Vol (A2C):   93.2 ml 38.48 ml/m RA Volume:   75.50 ml  31.17 ml/m LA Vol (A4C):   89.7 ml 37.04 ml/m LA Biplane Vol: 94.7 ml 39.10 ml/m  AORTIC VALVE AV Area (Vmax):    1.46 cm AV Area (Vmean):   1.28 cm AV Area (VTI):     1.44 cm AV Vmax:           224.00 cm/s AV Vmean:          158.000 cm/s AV VTI:            0.466 m AV Peak Grad:      20.1 mmHg AV Mean Grad:      11.0 mmHg LVOT Vmax:         104.00 cm/s LVOT Vmean:        64.500 cm/s LVOT VTI:          0.213 m LVOT/AV VTI ratio: 0.46  AORTA Ao Root diam: 3.50 cm MITRAL VALVE                TRICUSPID VALVE MV Area (PHT): 4.49 cm     TR Peak grad:   54.2 mmHg MV Decel Time: 169 msec     TR Vmax:        368.00 cm/s MV E velocity: 139.00 cm/s MV A velocity: 30.60 cm/s   SHUNTS MV E/A ratio:  4.54         Systemic VTI:  0.21 m                              Systemic Diam: 2.00 cm Dani Gobble Croitoru MD Electronically signed by Sanda Klein MD Signature Date/Time: 12/08/2019/3:50:51 PM    Final    US Abdomen Limited RUQ (LIVER/GB)  Result Date: 12/07/2019 CLINICAL DATA:  Cirrhosis EXAM: ULTRASOUND ABDOMEN LIMITED RIGHT UPPER QUADRANT COMPARISON:  April 09, 2017 FINDINGS: Gallbladder: No gallstones visualized. Mild diffuse gallbladder wall thickening the relatively decompressed gallbladder up to 5 mm. No sonographic Murphy sign noted by sonographer. Common bile duct: Diameter: 5 mm, within normal limits Liver: Mildly nodular contour of the liver. Liver echogenicity is heterogeneous. Small volume ascites. Portal vein is patent on color Doppler imaging with normal direction of blood flow towards the liver. Other: None. IMPRESSION: 1. Constellation of  findings are consistent with cirrhosis small volume ascites. No focal hepatic lesion identified sonographically. 2. Mild diffuse gallbladder wall thickening is most likely due to volume status or hypoalbuminemia. Electronically Signed   By: Valentino Saxon MD   On: 12/07/2019 17:02    ASSESSMENT & PLAN: 79 year old Caucasian male with extensive heart disease, on Eliquis for atrial fibrillation, liver cirrhosis, was admitted for worsening anemia  1. macrocytic anemia, s/p 3 units blood transfusion 2. CHF exacerbation, precipitated by severe anemia 3. Acute on chronic kidney disease, stage III 4. Coronary artery disease, status post CABG 5. Atrial fibrillation status post pacemaker, on chronic eliquis  6. HTN 7. Liver cirrhosis, etiology has not been worked up  8. T2DM   Recommendations: -Lab reviewed, he has developed worsening microcytic anemia, with low platelet reticulocyte count, no lab evidence of hemolysis. Folic acid and H00 levels are normal, no iron deficiency. This is likely multifactorial, related to liver cirrhosis and CKD, but the degree of anemia is out of proportion.  Underlying bone marrow disease, especially MDS, is to be ruled out. I recommend bone marrow biopsy to be done while he is in hospital. I discussed the procedure, risks and complications with patient and his wife in detail. Patient is afraid of pain, I reassured that he will unlikely have severe pain from biopsy.  -Given his liver cirrhosis and chronic anticoagulation, it is reasonable to ruled out GI bleeding. He also needs EGD to rule out varices. He has been seen by GI, planned for endoscopy on Tuesday. -Patient and his wife would like to think about bone marrow biopsy, and make decision tomorrow. She would like him to have endoscopy first, so bone marrow biopsy likely will be Wednesday or Thursday.  -I agree with blood transfusion to keep hemoglobin above 8.5-9.0, given his hear disease   All questions were answered. The patient knows to call the clinic with any problems, questions or concerns.      Truitt Merle, MD 12/08/2019 4:57 PM

## 2019-12-08 NOTE — Progress Notes (Signed)
Date: 12/08/2019  Patient name: Evan [name]  Medical record number: [id number]  Date of birth: 1940-09-06   I have seen and evaluated Evan [name] and discussed their care with the Residency Team.  In brief, patient is a 79 year old male with a past medical history of chronic microcytic anemia, chronic diastolic heart failure, persistent A. fib on anticoagulation, status post pacemaker, cirrhosis, hypertension, hyperlipidemia, CAD status post CABG, carotid artery disease, aortic stenosis status post TAVR, prior CVA, type 2 diabetes who presented to the ED with worsening shortness of breath.  Over the last 2 months patient has noted progressively worsening shortness of breath especially with exertion and relieved with rest.  Patient also had chronic fatigue and intermittent lightheadedness.  Patient has been following with cardiology and has had adjustments made to his diuretic regimen and had his Lasix increased from 40 mg daily to 40 mg twice daily.  Patient was also started on home O2 by cardiology for his persistent shortness of breath.  On the day prior to his admission, he was seen by cardiology and had blood work drawn which showed a hemoglobin of 6.2 down from 9.57 months prior as well as a worsening creatinine of 2.32 from a baseline of approximately 1.4.  Patient has been getting worked up for his anemia over the last several years and was seeing hematology as an outpatient for this.  He underwent extensive work-up which was unrevealing but has not yet had a bone marrow biopsy or colonoscopy.  Today, patient states that his shortness of breath has improved and he feels much better.  PMHx, Fam Hx, and/or Soc Hx : As per resident admit note  Vitals:   12/08/19 0540 12/08/19 1142  BP: (!) 141/64 135/63  Pulse: 66 65  Resp: 20 20  Temp: 97.9 F (36.6 C) 97.8 F (36.6 C)  SpO2: 97% 98%   General: Awake, alert oriented x3, NAD CVS: Regular rate and rhythm, normal heart sounds Lungs:  CTA bilaterally Abdomen: Soft, obese, nontender, normoactive bowel sounds Extremities: 1+ bilateral lower extremity pitting edema noted on exam, nontender to palpation Psych: Normal mood and affect HEENT: Normocephalic, atraumatic Skin: Warm and dry   Assessment and Plan: I have seen and evaluated the patient as outlined above. I agree with the formulated Assessment and Plan as detailed in the residents' note, with the following changes:   1.  Symptomatic anemia: -The etiology behind the patient's anemia remains uncertain at this time.  Patient had an extensive work-up done by hematology back in March 2021.  Patient however has not had a bone marrow or colonoscopy which was recommended at the time.  Patient now noted to have worsening anemia and was admitted for further evaluation. -Peripheral smear showing schistocytes, ovalocytes and teardrop cells.  He has had no signs of hemolysis though with his LDH within normal limits.  He also has a normal platelet count making TTP/HUS unlikely. -Patient is status post 3 units PRBC with improvement of his hemoglobin to 8.8.  We will continue to monitor his CBC daily. -Hematology to follow-up today.  We will follow up recommendations.  Patient may be able to have a bone marrow biopsy done while inpatient -Patient does have a mildly elevated bilirubin but has normal LDH which would make hemolysis less likely.  We will follow-up haptoglobin levels -B12 is within normal limits -Continue with home PPI for now -GI follow-up and recommendations appreciated.  It is unclear if his anemia secondary to a GI origin.  Patient will need cardiology clearance prior to consideration of endoscopy/colonoscopy -We will hold anticoagulation at this time (patient was on apixaban at home) -No further work-up at this time  2.  Acute on chronic diastolic heart failure exacerbation: -Patient has a history of chronic diastolic heart failure and has been following up with  cardiology for this.  He recently had his Lasix increased to 40 mg twice daily from once daily 2 days prior to his admission. -On recent cardiology visit on the day prior to his admission he was noted to have an elevated proBNP of up to 9000.  Patient did have an x-ray here which showed pulmonary vascular congestion and interstitial edema. -Patient was started on Lasix 40 mg IV twice daily initially but this was transitioned to Lasix 40 mg IV once daily -We will follow up strict I's and O's and daily weights -We will follow-up 2D echo -Cardiology follow-up and recommendations appreciated -No further work-up at this time  3.  AKI on CKD stage IIIb: -Patient's creatinine was elevated 2.48 from his baseline of 1.4 back in March 2021 -The etiology of his elevated creatinine remains uncertain but is likely multifactorial in origin considering his recent increase in diuretic dosage at home as well as being on an ACE inhibitor and hydrochlorothiazide and hypovolemia secondary to his underlying anemia. -We will continue to hold ACE inhibitor and 100 for thiazide for now -Would maintain patient on Lasix 40 mg IV daily -Patient's creatinine has improved to 2.25 today -We will continue to monitor BMP closely  Aldine Contes, MD 11/14/202112:10 PM

## 2019-12-08 NOTE — Progress Notes (Signed)
Subjective:   Evan [name] is a 79 year old male with past medical history significant for chronic macrocytic anemia, diastolic CHF (last EF of 33-54% on 08/2018), persistent atrial fibrillation (s/p pacemaker, on Eliquis), cirrhosis, HTN, HLD, CAD s/p CABG 2005, carotid artery disease, aortic stenosis s/p TAVR, prior CVA (right PCA), T2DM, and chronic use of 2L O2 via [place] who presented to the [facility] ED on 11/13 due to shortness of breath found to have progression of his chronic macrocytic anemia, acute on chronic CHF exacerbation as well as acute on chronic kidney disease.   Overnight, patient received an additional 2 units of blood transfusion.  This morning, he reports that he feels great. His shortness of breath has improved and he denies chest pain, lightheadedness, dizziness, or fatigue. He understands and agrees with the plan to receive additional diuresis, undergo evaluation by hematology/oncology, and get a colonoscopy/endoscopy within the next few days. He has no further questions or concerns.  Objective:  Vital signs in last 24 hours: Vitals:   12/08/19 0308 12/08/19 0310 12/08/19 0500 12/08/19 0540  BP: (!) 115/47 (!) 115/47  (!) 141/64  Pulse: 65 65  66  Resp: _0 Temp: 98 F (36.7 C) 98 F (36.7 C)  97.9 F (36.6 C)  TempSrc:  Oral  Oral  SpO2:  94%  97%  Weight:   122.7 kg   Height:      SpO2: 97 % O2 Flow Rate (L/min): 2 L/min  Intake/Output Summary (Last 24 hours) at 12/08/2019 1012 Last data filed at 12/08/2019 0950 Gross per 24 hour  Intake 1042 ml  Output 350 ml  Net 692 ml   Filed Weights   12/07/19 0959 12/07/19 2234 12/08/19 0500  Weight: 125.2 kg 122.3 kg 122.7 kg  Physical Exam Vitals and nursing note reviewed.  Constitutional:      General: He is not in acute distress.    Appearance: He is obese.  HENT:     Head: Normocephalic and atraumatic.  Cardiovascular:     Rate and Rhythm: Normal rate and regular rhythm.  Pulmonary:      Effort: Pulmonary effort is normal. No respiratory distress.     Breath sounds: Normal breath sounds.  Abdominal:     General: Bowel sounds are normal.     Palpations: Abdomen is soft.     Tenderness: There is no abdominal tenderness.  Musculoskeletal:        General: Normal range of motion.     Cervical back: Normal range of motion and neck supple.     Right lower leg: Edema present.     Left lower leg: Edema present.     Comments: 1+ pitting edema of bilateral lower extremities extending to the knees  Skin:    General: Skin is warm and dry.     Capillary Refill: Capillary refill takes less than 2 seconds.  Neurological:     General: No focal deficit present.     Mental Status: He is alert.  Psychiatric:        Mood and Affect: Mood normal.        Behavior: Behavior normal.    CBC Latest Ref Rng & Units 12/08/2019 12/07/2019 12/07/2019  WBC 4.0 - 10.5 K/uL 5.7 5.6 5.7  Hemoglobin 13.0 - 17.0 g/dL 8.8(L) 7.3(L) 6.4(LL)  Hematocrit 39 - 52 % 27.8(L) 24.5(L) 21.5(L)  Platelets 150 - 400 K/uL 266 245 265   CMP Latest Ref Rng & Units 12/08/2019 12/07/2019  12/06/2019  Glucose 70 - 99 mg/dL 147(H) 196(H) 155(H)  BUN 8 - 23 mg/dL 53(H) 57(H) 54(H)  Creatinine 0.61 - 1.24 mg/dL 2.25(H) 2.48(H) 2.32(H)  Sodium 135 - 145 mmol/L 139 139 141  Potassium 3.5 - 5.1 mmol/L 4.1 3.9 4.4  Chloride 98 - 111 mmol/L 99 99 98  CO2 22 - 32 mmol/L _0 Calcium 8.9 - 10.3 mg/dL 9.4 9.0 9.0  Total Protein 6.5 - 8.1 g/dL 6.6 6.6 -  Total Bilirubin 0.3 - 1.2 mg/dL 3.1(H) 2.3(H) -  Alkaline Phos 38 - 126 U/L 128(H) 127(H) -  AST 15 - 41 U/L 16 15 -  ALT 0 - 44 U/L 18 17 -   Haptoglobin - pending AFP tumor marker - pending Hemoglobin A1c - 6.6 LDH - 190 Direct bilirubin - 0.7 Indirect bilirubin 1.6 Folate - 11.8 Ferritin - 71 Iron - 251 TIBC - 398 Saturation ratios - 63% UIBC - 147 Vitamin B12 - 1,433 Technologist smear - Ovalocytes, teardrop cells, schistocytes Pathologist smear -  pending Reticulocytes: -Retic Ct Pct 2.3 -RBC 1.89 -Retic count, absolute 42.5 -Immature Retic Fract 22.2  DG Chest 2 View  Result Date: 12/07/2019 CLINICAL DATA:  Shortness of breath. EXAM: CHEST - 2 VIEW COMPARISON:  03/29/2018 FINDINGS: 1016 hours. The cardio pericardial silhouette is enlarged. There is pulmonary vascular congestion without overt pulmonary edema. Diffuse interstitial opacity suggest component of underlying interstitial edema. Left single lead permanent pacemaker noted. Bones are demineralized. IMPRESSION: Enlargement of the cardiopericardial silhouette with pulmonary vascular congestion and probable interstitial edema. Electronically Signed   By: Misty Stanley M.D.   On: 12/07/2019 10:40   US Abdomen Limited RUQ (LIVER/GB)  Result Date: 12/07/2019 CLINICAL DATA:  Cirrhosis EXAM: ULTRASOUND ABDOMEN LIMITED RIGHT UPPER QUADRANT COMPARISON:  April 09, 2017 FINDINGS: Gallbladder: No gallstones visualized. Mild diffuse gallbladder wall thickening the relatively decompressed gallbladder up to 5 mm. No sonographic Murphy sign noted by sonographer. Common bile duct: Diameter: 5 mm, within normal limits Liver: Mildly nodular contour of the liver. Liver echogenicity is heterogeneous. Small volume ascites. Portal vein is patent on color Doppler imaging with normal direction of blood flow towards the liver. Other: None. IMPRESSION: 1. 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 No focal hepatic lesion identified sonographically. 2. Mild diffuse gallbladder wall thickening is most likely due to volume status or hypoalbuminemia. Electronically Signed   By: Valentino Saxon MD   On: 12/07/2019 17:02   Echocardiogram Complete - Ordered  Assessment/Plan:  Active Problems:   Symptomatic anemia  Evan [name] is a 79 year old male with past medical history significant for chronic macrocytic anemia, diastolic CHF (last EF of 51-76% on 08/2018),  persistent atrial fibrillation (s/p pacemaker, on Eliquis), cirrhosis, HTN, HLD, CAD s/p CABG 2005, carotid artery disease, aortic stenosis s/p TAVR, prior CVA (right PCA), T2DM, and chronic use of 2L O2 via [place] who presented to the [facility] ED on 11/13 due to shortness of breath found to have progression of his chronic macrocytic anemia, acute on chronic CHF exacerbation as well as acute on chronic kidney disease.   #Symptomatic macrocytic anemia, active Patient presented with symptomatic macrocytic anemia. Patient underwent significant laboratory workup with Dr. Rogue Bussing (as described in the HPI) for this chronic condition in March of 2021 with recommendation to undergo bone marrow biopsy and colonoscopy, however patient deferred procedures. Hemoglobin noted to be 6.2 two days ago at cardiology clinic, down from 9.5 in March, and sent to ED.  Patient's anemia likely significantly contributed to his shortness of breath, lightheadedness, and fatigue, however his coexisting CHF is likely a large factor as well. The etiology of his anemia is unknown at this time, however he does have schistocytes, ovalocytes and teardrop cells on his peripheral smear. Remaining laboratory workup unrevealing for a deficiency or hemolytic cause. He responded well to blood transfusions (s/p 3 units) with hemoglobin most recently of 8.8. GI consulted and recommending endoscopy/colonoscopy upon receiving cardiology clearance. Hematology/oncology to see patient today. -Follow-up haptoglobin and pathologist smear results -Daily CBC, transfuse if Hb <9 -Continue home vitamin B12 supplementation, although B12 level elevated -Continue home protonix 41m daily -Endoscopy/colonoscopy following cardiology clearance -Appreciate hematology/oncology recommendations  #Acute on chronic diastolic heart failure exacerbation, active Patient with history of diastolic CHF (EF 523-55%87/3220. He has been following closely with cardiology for  management of this condition and was previously on 453mlasix daily with escalation of his lasix dose to 4053mwice daily two days prior to arrival. His Pro-BNP is significantly elevated to 9,423 with radiologic evidence of pulmonary vascular congestion and interstitial edema. He received 54m47m lasix twice daily for the first 36 hours of his hospital course with appropriate response. Cardiology was consulted and providing recommendations. -IV lasix 54mg58mly -Echocardiogram complete ordered -Strict intake and output -Daily weights -Continue home supplemental oxygen of 2L  #Acute on chronic kidney disease stage 3, active Patient with history of CKD3 with creatinine of 2.48 on arrival up from 1.49 in March of 2021. Unclear whether patient's elevated creatinine secondary to progression of his CKD versus acute injury in the setting of his anemia and acute CHF exacerbation. Patient's creatinine improving with blood transfusions and diuresis. -Daily BMP, monitor creatinine -Avoid nephrotoxins -Holding home benazepril -Holding home HCTZ  #Atrial fibrillation s/p pacemaker, chronic Patient with history of persistent atrial fibrillation s/p pacemaker placement in normal sinus rhythm on admission. Patient's last dose of Eliquis was evening prior to admission. Currently holding as patient may undergo colonoscopy/endoscopy shortly. -Holding home Eliquis 5mg t59me daily -Telemetry  #HTN, chronic Patient's home blood pressure medication regimen includes amlodipine 2.5mg da41m, benazepril 54mg da76m hydralazine 25mg twi54maily, and HCTZ 25mg dail16mContinue amlodipine 2.5mg daily 70mlding home benazepril 54mg daily 3mtinue hydralazine 25mg twice d69m -Holding home HCTZ 25mg daily  #96mhosis, chronic Patient noted to have shrunken appearance and nodular contour of the liver suggestive of cirrhosis on CTA Abdomen Pelvis in March 2019. Patient has no history of alcoholism and hepatitis B and C  laboratory workup was unremarkable. RUQ ultrasound consistent with prior. -GI consulted, appreciate recommendations -follow-up alpha-fetoprotein -follow-up PT/INR  #T2DM, chronic Hemoglobin A1c of 6.6 on admission. -Moderate SSI -CBG monitoring 4 times daily, before meals and at bedtime  #HLD, chronic -Continue home zetia 10mg daily  #C46mstatus: Full code #VTE ppx: SCDs #Diet: Clear liquid diet #IVF: None #Bowel regimen: Miralax twice daily   Nandana Krolikowski, MattheCato Mulligan, 10:12 AM Pager: [phone] [phone] and 1pm on weekends: On Call pager [phone]

## 2019-12-08 NOTE — Progress Notes (Signed)
  Echocardiogram 2D Echocardiogram has been performed.  Evan [name] 12/08/2019, 3:30 PM

## 2019-12-08 NOTE — Progress Notes (Signed)
Progress Note  Patient Name: Evan [name] Date of Encounter: 12/08/2019  [facility] Cardiologist: [name] [name], MD   Subjective   No CP; dyspnea improving  Inpatient Medications    Scheduled Meds:  amLODipine  2.5 mg Oral Daily   ezetimibe  10 mg Oral Daily   furosemide  40 mg Intravenous BID   hydrALAZINE  25 mg Oral BID   insulin aspart  0-15 Units Subcutaneous TID WC   pantoprazole  40 mg Oral Daily   polyethylene glycol  17 g Oral BID   tamsulosin  0.4 mg Oral Daily   cyanocobalamin  1,000 mcg Oral Daily   Continuous Infusions:  PRN Meds: acetaminophen **OR** acetaminophen   Vital Signs    Vitals:   12/08/19 0308 12/08/19 0310 12/08/19 0500 12/08/19 0540  BP: (!) 115/47 (!) 115/47  (!) 141/64  Pulse: 65 65  66  Resp: 18 18  20   Temp: 98 F (36.7 C) 98 F (36.7 C)  97.9 F (36.6 C)  TempSrc:  Oral  Oral  SpO2:  94%  97%  Weight:   122.7 kg   Height:        Intake/Output Summary (Last 24 hours) at 12/08/2019 0756 Last data filed at 12/08/2019 0550 Gross per 24 hour  Intake 1042 ml  Output 200 ml  Net 842 ml   Last 3 Weights 12/08/2019 12/07/2019 12/07/2019  Weight (lbs) 270 lb 6.4 oz 269 lb 10 oz 276 lb  Weight (kg) 122.653 kg 122.3 kg 125.193 kg      Telemetry    Atrial fibrirllation- Personally Reviewed  Physical Exam   GEN: No acute distress.   Neck: No JVD Cardiac: irregular Respiratory: Clear to auscultation bilaterally. GI: Soft, nontender, non-distended  MS: trace edema Neuro:  Nonfocal  Psych: Normal affect   Labs     Chemistry Recent Labs  Lab 12/06/19 1630 12/07/19 1015 12/07/19 1844  NA 141 139  --   K 4.4 3.9  --   CL 98 99  --   CO2 28 28  --   GLUCOSE 155* 196*  --   BUN 54* 57*  --   CREATININE 2.32* 2.48*  --   CALCIUM 9.0 9.0  --   PROT  --   --  6.6  ALBUMIN  --   --  3.8  AST  --   --  15  ALT  --   --  17  ALKPHOS  --   --  127*  BILITOT  --   --  2.3*  GFRNONAA 26* 26*  --     GFRAA 30*  --   --   ANIONGAP  --  12  --      Hematology Recent Labs  Lab 12/06/19 1630 12/07/19 1015 12/07/19 2005  WBC 5.5 5.7 5.6  RBC 1.84* 1.87*   1.89* 2.20*  HGB 6.2* 6.4* 7.3*  HCT 19.6* 21.5* 24.5*  MCV 107* 115.0* 111.4*  MCH 33.7* 34.2* 33.2  MCHC 31.6 29.8* 29.8*  RDW 16.9* 18.7* 21.4*  PLT 287 265 245    BNP Recent Labs  Lab 12/06/19 1633  PROBNP 9,423*     Radiology    DG Chest 2 View  Result Date: 12/07/2019 CLINICAL DATA:  Shortness of breath. EXAM: CHEST - 2 VIEW COMPARISON:  03/29/2018 FINDINGS: 1016 hours. The cardio pericardial silhouette is enlarged. There is pulmonary vascular congestion without overt pulmonary edema. Diffuse interstitial opacity suggest component of underlying interstitial edema.  Left single lead permanent pacemaker noted. Bones are demineralized. IMPRESSION: Enlargement of the cardiopericardial silhouette with pulmonary vascular congestion and probable interstitial edema. Electronically Signed   By: Misty Stanley M.D.   On: 12/07/2019 10:40   US Abdomen Limited RUQ (LIVER/GB)  Result Date: 12/07/2019 CLINICAL DATA:  Cirrhosis EXAM: ULTRASOUND ABDOMEN LIMITED RIGHT UPPER QUADRANT COMPARISON:  April 09, 2017 FINDINGS: Gallbladder: No gallstones visualized. Mild diffuse gallbladder wall thickening the relatively decompressed gallbladder up to 5 mm. No sonographic Murphy sign noted by sonographer. Common bile duct: Diameter: 5 mm, within normal limits Liver: Mildly nodular contour of the liver. Liver echogenicity is heterogeneous. Small volume ascites. Portal vein is patent on color Doppler imaging with normal direction of blood flow towards the liver. Other: None. IMPRESSION: 1. 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 No focal hepatic lesion identified sonographically. 2. Mild diffuse gallbladder wall thickening is most likely due to volume status or hypoalbuminemia. Electronically Signed   By:  Valentino Saxon MD   On: 12/07/2019 17:02    Patient Profile     79 y.o. male with past medical history of coronary artery disease, hypertension, hyperlipidemia, permanent atrial fibrillation, diabetes mellitus, previous pacemaker, previous TAVR admitted with acute on chronic diastolic congestive heart failure and severe anemia.  Assessment & Plan    1. Acute on chronic diastolic congestive heart failure-volume status improving.  Patient significantly anemic at time of admission which was likely the precipitating factor of his worsening CHF.  Await echo to assess LV function.  I will decrease Lasix to 40 mg IV daily.  Hopefully symptoms will continue to improve as his hemoglobin improves. 2. Anemia-macrocytic. Hemoglobin has improved but remains decreased.  Would favor transfusing 1 more unit of blood given symptomatic anemia.  Anemia labs pending.  Gastroenterology has evaluated and plans EGD and colonoscopy. 3. Acute on chronic stage III kidney disease-likely related to anemia as well.  Would give 1 more unit of packed red blood cells.  ACE inhibitor and HCTZ on hold.  Decrease Lasix to 40 mg daily.  Follow renal function closely. 4. Permanent atrial fibrillation-patient's heart rate is controlled on no medications.  Given severe anemia he is scheduled for EGD and colonoscopy.  Apixaban is on hold until GI evaluation complete. 5. Prior pacemaker 6. History of cirrhosis-Per gastroenterology. 7. Hypertension-ACE inhibitor and HCTZ on hold in setting of worsening renal function.  Follow blood pressure and adjust amlodipine or hydralazine as needed.  8. Status post TAVR-repeat echocardiogram pending.  For questions or updates, please contact [place] Please consult [email or web address] for contact info under        Signed, Kirk Ruths, MD  12/08/2019, 7:56 AM

## 2019-12-09 DIAGNOSIS — I5041 Acute combined systolic (congestive) and diastolic (congestive) heart failure: Secondary | ICD-10-CM | POA: Diagnosis not present

## 2019-12-09 DIAGNOSIS — N179 Acute kidney failure, unspecified: Secondary | ICD-10-CM

## 2019-12-09 DIAGNOSIS — D649 Anemia, unspecified: Secondary | ICD-10-CM | POA: Diagnosis not present

## 2019-12-09 DIAGNOSIS — D539 Nutritional anemia, unspecified: Secondary | ICD-10-CM | POA: Diagnosis not present

## 2019-12-09 DIAGNOSIS — Z952 Presence of prosthetic heart valve: Secondary | ICD-10-CM

## 2019-12-09 DIAGNOSIS — N1832 Chronic kidney disease, stage 3b: Secondary | ICD-10-CM | POA: Diagnosis not present

## 2019-12-09 DIAGNOSIS — I130 Hypertensive heart and chronic kidney disease with heart failure and stage 1 through stage 4 chronic kidney disease, or unspecified chronic kidney disease: Secondary | ICD-10-CM | POA: Diagnosis not present

## 2019-12-09 DIAGNOSIS — I2510 Atherosclerotic heart disease of native coronary artery without angina pectoris: Secondary | ICD-10-CM

## 2019-12-09 DIAGNOSIS — R0602 Shortness of breath: Secondary | ICD-10-CM | POA: Diagnosis not present

## 2019-12-09 DIAGNOSIS — Z951 Presence of aortocoronary bypass graft: Secondary | ICD-10-CM

## 2019-12-09 DIAGNOSIS — I480 Paroxysmal atrial fibrillation: Secondary | ICD-10-CM

## 2019-12-09 DIAGNOSIS — E669 Obesity, unspecified: Secondary | ICD-10-CM

## 2019-12-09 LAB — CBC
HCT: 26.8 % — ABNORMAL LOW (ref 39.0–52.0)
Hemoglobin: 8.3 g/dL — ABNORMAL LOW (ref 13.0–17.0)
MCH: 32 pg (ref 26.0–34.0)
MCHC: 31 g/dL (ref 30.0–36.0)
MCV: 103.5 fL — ABNORMAL HIGH (ref 80.0–100.0)
Platelets: 257 10*3/uL (ref 150–400)
RBC: 2.59 MIL/uL — ABNORMAL LOW (ref 4.22–5.81)
RDW: 21.9 % — ABNORMAL HIGH (ref 11.5–15.5)
WBC: 5.8 10*3/uL (ref 4.0–10.5)
nRBC: 0 % (ref 0.0–0.2)

## 2019-12-09 LAB — COMPREHENSIVE METABOLIC PANEL
ALT: 15 U/L (ref 0–44)
AST: 17 U/L (ref 15–41)
Albumin: 3.6 g/dL (ref 3.5–5.0)
Alkaline Phosphatase: 104 U/L (ref 38–126)
Anion gap: 13 (ref 5–15)
BUN: 46 mg/dL — ABNORMAL HIGH (ref 8–23)
CO2: 28 mmol/L (ref 22–32)
Calcium: 9.2 mg/dL (ref 8.9–10.3)
Chloride: 100 mmol/L (ref 98–111)
Creatinine, Ser: 2.09 mg/dL — ABNORMAL HIGH (ref 0.61–1.24)
GFR, Estimated: 32 mL/min — ABNORMAL LOW (ref >60)
Glucose, Bld: 116 mg/dL — ABNORMAL HIGH (ref 70–99)
Potassium: 3.7 mmol/L (ref 3.5–5.1)
Sodium: 141 mmol/L (ref 135–145)
Total Bilirubin: 2.9 mg/dL — ABNORMAL HIGH (ref 0.3–1.2)
Total Protein: 6 g/dL — ABNORMAL LOW (ref 6.5–8.1)

## 2019-12-09 LAB — AFP TUMOR MARKER: AFP, Serum, Tumor Marker: 1.4 ng/mL (ref 0.0–8.3)

## 2019-12-09 LAB — HAPTOGLOBIN: Haptoglobin: 172 mg/dL (ref 34–355)

## 2019-12-09 LAB — GLUCOSE, CAPILLARY
Glucose-Capillary: 115 mg/dL — ABNORMAL HIGH (ref 70–99)
Glucose-Capillary: 143 mg/dL — ABNORMAL HIGH (ref 70–99)
Glucose-Capillary: 114 mg/dL — ABNORMAL HIGH (ref 70–99)
Glucose-Capillary: 134 mg/dL — ABNORMAL HIGH (ref 70–99)

## 2019-12-09 LAB — PATHOLOGIST SMEAR REVIEW

## 2019-12-09 LAB — PREPARE RBC (CROSSMATCH)

## 2019-12-09 LAB — HEMOGLOBIN AND HEMATOCRIT, BLOOD
HCT: 30.8 % — ABNORMAL LOW (ref 39.0–52.0)
Hemoglobin: 9.8 g/dL — ABNORMAL LOW (ref 13.0–17.0)

## 2019-12-09 MED ORDER — POTASSIUM CHLORIDE CRYS ER 20 MEQ PO TBCR
40.0000 meq | EXTENDED_RELEASE_TABLET | Freq: Once | ORAL | Status: AC
  Administered 2019-12-09: 40 meq via ORAL
  Filled 2019-12-09: qty 2

## 2019-12-09 MED ORDER — ONDANSETRON HCL 4 MG PO TABS
4.0000 mg | ORAL_TABLET | Freq: Once | ORAL | Status: AC
  Administered 2019-12-09: 4 mg via ORAL
  Filled 2019-12-09: qty 1

## 2019-12-09 MED ORDER — SODIUM CHLORIDE 0.9% IV SOLUTION: Freq: Once | INTRAVENOUS | Status: AC

## 2019-12-09 MED ORDER — SODIUM CHLORIDE 0.9 % IV SOLN: INTRAVENOUS | Status: DC

## 2019-12-09 MED ORDER — PANTOPRAZOLE SODIUM 40 MG IV SOLR
40.0000 mg | Freq: Two times a day (BID) | INTRAVENOUS | Status: DC
  Administered 2019-12-09 – 2019-12-10 (×3): 40 mg via INTRAVENOUS
  Filled 2019-12-09 (×3): qty 40

## 2019-12-09 MED ORDER — PEG 3350-KCL-NA BICARB-NACL 420 G PO SOLR
4000.0000 mL | Freq: Once | ORAL | Status: AC
  Administered 2019-12-09: 4000 mL via ORAL
  Filled 2019-12-09: qty 4000

## 2019-12-09 MED ORDER — ONDANSETRON HCL 4 MG PO TABS: 4.0000 mg | ORAL_TABLET | Freq: Three times a day (TID) | ORAL | Status: DC | PRN

## 2019-12-09 NOTE — Progress Notes (Signed)
Blood transfusion still running, per MD place an order for H&H and page if Hg is not above 9

## 2019-12-09 NOTE — H&P (View-Only) (Signed)
[facility] Gastroenterology Progress Note  Evan [name] 79 y.o. November 22, 1940  CC:  Anemia  Subjective: Patient reports feeling weak.  States his breathing is about the same as yesterday and is worse with ambulation.  Denies chest pain.  Reports a brown bowel movement this morning.  Denies any abdominal pain but reports mild nausea.  No vomiting.  Patient's wife is present at bedside and states patient woke up feeling temporarily confused, but this resolved.  She also states patient had a colonoscopy many years ago which was unable to be fully completed due to what sounds like a tortuous colon.  ROS : Review of Systems  Respiratory: Positive for shortness of breath. Negative for cough.   Cardiovascular: Negative for chest pain.  Gastrointestinal: Positive for nausea. Negative for abdominal pain, blood in stool, constipation, diarrhea, heartburn, melena and vomiting.    Objective: Vital signs in last 24 hours: Vitals:   12/09/19 0912 12/09/19 1028  BP: 139/61 (!) 111/59  Pulse: 60 64  Resp: 20 18  Temp:  97.9 F (36.6 C)  SpO2: 100% 100%    Physical Exam:  General:  Lethargic, oriented, cooperative, no acute distress  Head:  Normocephalic, without obvious abnormality, atraumatic; nasal cannula in place  Eyes:  Mild conjunctival pallor, EOMs intact   Lungs:   Clear to auscultation bilaterally, respirations unlabored  Heart:  Regular rate and rhythm, S1, S2 normal  Abdomen:   Soft, non-tender, bowel sounds active all four quadrants,  no guarding or peritoneal signs   Extremities: Mild bilateral lower extremity edema  Pulses: 2+ and symmetric    Lab Results: Recent Labs    12/08/19 0749 12/09/19 0100  NA 139 141  K 4.1 3.7  CL 99 100  CO2 28 28  GLUCOSE 147* 116*  BUN 53* 46*  CREATININE 2.25* 2.09*  CALCIUM 9.4 9.2   Recent Labs    12/08/19 0749 12/09/19 0100  AST 16 17  ALT 18 15  ALKPHOS 128* 104  BILITOT 3.1* 2.9*  PROT 6.6 6.0*  ALBUMIN 3.9 3.6   Recent  Labs    12/08/19 0749 12/09/19 0100  WBC 5.9  5.7 5.8  HGB 8.8*  8.8* 8.3*  HCT 28.3*  27.8* 26.8*  MCV 103.7*  103.3* 103.5*  PLT 262  266 257   Recent Labs    12/08/19 1534  LABPROT 16.4*  INR 1.4*    Assessment: Symptomatic macrocytic anemia, unclear etiology, no signs of GI bleeding, FOBT negative, no iron deficiency -Hgb 8.3 today, stable.  He had 2u pRBCs on 11/14 and 1u PRBCs 11/13  Cirrhosis per imaging, noted on CT in 2019, as well as on [place] 12/07/19.  Elevated T. 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  No thrombocytopneia or coagulopathy.  AST/ALT/ALP normal. MELD score of 22 as of 12/08/19.  CKD: BUN 46/Cr 2.09  CAD, CABG, hypertension, atrial fibrillation, pacemaker, TAVR, carotid artery disease, CHF  Plan: EGD and colonoscopy tomorrow.  Per Dr. Stanford Breed, okay to proceed from a cardiology standpoint.  I thoroughly discussed the procedure with the patient and patient's wife at bedside to include nature, alternatives, benefits, and risks (including but not limited to bleeding, infection, perforation, anesthesia/cardiac and pulmonary complications).  Patient and patient's wife verbalized understanding.  Patient gave verbal consent to proceed with EGD and colonoscopy.  Clear liquid diet, NuLYETLY prep today, NPO after midnight.  Continue to monitor H&H with transfusion as needed to maintain hemoglobin greater than 9 per cardiology recommendations.  Protonix  40 mg IV BID. Anti-emetics as needed for nausea.  [facility] will follow.  Salley Slaughter PA-C 12/09/2019, 10:51 AM  Contact #  [phone]

## 2019-12-09 NOTE — Progress Notes (Addendum)
[facility] Gastroenterology Progress Note  Evan [name] 79 y.o. 11-21-1940  CC:  Anemia  Subjective: Patient reports feeling weak.  States his breathing is about the same as yesterday and is worse with ambulation.  Denies chest pain.  Reports a brown bowel movement this morning.  Denies any abdominal pain but reports mild nausea.  No vomiting.  Patient's wife is present at bedside and states patient woke up feeling temporarily confused, but this resolved.  She also states patient had a colonoscopy many years ago which was unable to be fully completed due to what sounds like a tortuous colon.  ROS : Review of Systems  Respiratory: Positive for shortness of breath. Negative for cough.   Cardiovascular: Negative for chest pain.  Gastrointestinal: Positive for nausea. Negative for abdominal pain, blood in stool, constipation, diarrhea, heartburn, melena and vomiting.    Objective: Vital signs in last 24 hours: Vitals:   12/09/19 0912 12/09/19 1028  BP: 139/61 (!) 111/59  Pulse: 60 64  Resp: 20 18  Temp:  97.9 F (36.6 C)  SpO2: 100% 100%    Physical Exam:  General:  Lethargic, oriented, cooperative, no acute distress  Head:  Normocephalic, without obvious abnormality, atraumatic; nasal cannula in place  Eyes:  Mild conjunctival pallor, EOMs intact   Lungs:   Clear to auscultation bilaterally, respirations unlabored  Heart:  Regular rate and rhythm, S1, S2 normal  Abdomen:   Soft, non-tender, bowel sounds active all four quadrants,  no guarding or peritoneal signs   Extremities: Mild bilateral lower extremity edema  Pulses: 2+ and symmetric    Lab Results: Recent Labs    12/08/19 0749 12/09/19 0100  NA 139 141  K 4.1 3.7  CL 99 100  CO2 28 28  GLUCOSE 147* 116*  BUN 53* 46*  CREATININE 2.25* 2.09*  CALCIUM 9.4 9.2   Recent Labs    12/08/19 0749 12/09/19 0100  AST 16 17  ALT 18 15  ALKPHOS 128* 104  BILITOT 3.1* 2.9*  PROT 6.6 6.0*  ALBUMIN 3.9 3.6   Recent  Labs    12/08/19 0749 12/09/19 0100  WBC 5.9  5.7 5.8  HGB 8.8*  8.8* 8.3*  HCT 28.3*  27.8* 26.8*  MCV 103.7*  103.3* 103.5*  PLT 262  266 257   Recent Labs    12/08/19 1534  LABPROT 16.4*  INR 1.4*    Assessment: Symptomatic macrocytic anemia, unclear etiology, no signs of GI bleeding, FOBT negative, no iron deficiency -Hgb 8.3 today, stable.  He had 2u pRBCs on 11/14 and 1u PRBCs 11/13  Cirrhosis per imaging, noted on CT in 2019, as well as on [place] 12/07/19.  Elevated T. 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  No thrombocytopneia or coagulopathy.  AST/ALT/ALP normal. MELD score of 22 as of 12/08/19.  CKD: BUN 46/Cr 2.09  CAD, CABG, hypertension, atrial fibrillation, pacemaker, TAVR, carotid artery disease, CHF  Plan: EGD and colonoscopy tomorrow.  Per Dr. Stanford Breed, okay to proceed from a cardiology standpoint.  I thoroughly discussed the procedure with the patient and patient's wife at bedside to include nature, alternatives, benefits, and risks (including but not limited to bleeding, infection, perforation, anesthesia/cardiac and pulmonary complications).  Patient and patient's wife verbalized understanding.  Patient gave verbal consent to proceed with EGD and colonoscopy.  Clear liquid diet, NuLYETLY prep today, NPO after midnight.  Continue to monitor H&H with transfusion as needed to maintain hemoglobin greater than 9 per cardiology recommendations.  Protonix  40 mg IV BID. Anti-emetics as needed for nausea.  [facility] will follow.  Salley Slaughter PA-C 12/09/2019, 10:51 AM  Contact #  [phone]

## 2019-12-09 NOTE — Anesthesia Preprocedure Evaluation (Addendum)
Anesthesia Evaluation  Patient identified by MRN, date of birth, ID band Patient awake    Reviewed: Allergy & Precautions, NPO status , Patient's Chart, lab work & pertinent test results  History of Anesthesia Complications Negative for: history of anesthetic complications  Airway Mallampati: II  TM Distance: >3 FB Neck ROM: Full    Dental  (+) Edentulous Upper, Edentulous Lower   Pulmonary shortness of breath, former smoker,    Pulmonary exam normal breath sounds clear to auscultation       Cardiovascular hypertension, + CAD and +CHF  + dysrhythmias + pacemaker + Valvular Problems/Murmurs AS  Rhythm:Regular Rate:Normal + Systolic murmurs Echo 26/94/8546 1. Left ventricular ejection fraction, by estimation, is 45 to 50%. The left ventricle has mildly decreased function. The left ventricle demonstrates global hypokinesis. There is moderate concentric left ventricular hypertrophy. Left ventricular diastolic function could not be evaluated.  2. Right ventricular systolic function is moderately reduced. The right ventricular size is moderately enlarged. There is severely elevated pulmonary artery systolic pressure. The estimated right ventricular  systolic pressure is 27.0 mmHg.  3. Left atrial size was severely dilated.  4. Right atrial size was moderately dilated.  5. The mitral valve is degenerative. Mild mitral valve regurgitation. Moderate to severe mitral annular calcification.  6. The aortic valve has been repaired/replaced. Aortic valve regurgitation is not visualized. Procedure Date: 04/29/2017. Echo findings are consistent with normal structure and function of the aortic valve prosthesis. Aortic valve mean gradient measures 11.0 mmHg. Aortic valve Vmax measures 2.24 m/s.  7. The inferior vena cava is dilated in size with <50% respiratory variability, suggesting right atrial pressure of 15 mmHg.    Neuro/Psych CVA,  Residual Symptoms negative psych ROS   GI/Hepatic negative GI ROS, Neg liver ROS,   Endo/Other  diabetes  Renal/GU negative Renal ROS  negative genitourinary   Musculoskeletal  (+) Arthritis ,   Abdominal   Peds negative pediatric ROS (+)  Hematology  (+) Blood dyscrasia, anemia ,   Anesthesia Other Findings   Reproductive/Obstetrics negative OB ROS                            Anesthesia Physical  Anesthesia Plan  ASA: IV  Anesthesia Plan: MAC   Post-op Pain Management:    Induction: Intravenous  PONV Risk Score and Plan: 1 and Propofol infusion, Treatment may vary due to age or medical condition and TIVA  Airway Management Planned: Natural Airway and Simple Face Mask  Additional Equipment: None  Intra-op Plan:   Post-operative Plan:   Informed Consent: I have reviewed the patients History and Physical, chart, labs and discussed the procedure including the risks, benefits and alternatives for the proposed anesthesia with the patient or authorized representative who has indicated his/her understanding and acceptance.     Dental advisory given  Plan Discussed with: CRNA  Anesthesia Plan Comments:        Anesthesia Quick Evaluation

## 2019-12-09 NOTE — Consult Note (Signed)
Chief Complaint: Patient was seen in consultation today for progressive anemia/bone marrow biopsy and aspiration.  Referring Physician(s): Curcio, Roselie Awkward (oncology)  Supervising Physician: Ruthann Cancer  Patient Status: [facility] - In-pt  History of Present Illness: Evan [name] is a 79 y.o. male with a past medical history of hypertension, hyperlipidemia, CAD, HF, atrial fibrillation on chronic anticoagulation with Eliquis (LD 12/07/2019 per wife), s/p pacemaker, aortic stenosis, CVA 2017, diabetes mellitus type II, progressive anemia, OA, HOH, and obesity. Of note, patient has had progressive anemia for years, managed by Dr. Burlene Arnt- he was offered bone marrow biopsy/aspiraiton along with EGD/colonoscopy for further evaluation however patient declined at that time. He presented to [facility] ED 12/07/2019 with complaint of dyspnea. In ED, patient with anemia. He was given 4 units of PRBCs and admitted for further management. Hematology/oncology and GI were consulted and it was recommended that patient undergo EGD, colonoscopy, and IR bone marrow biopsy/aspiration for further evaluation of progressive anemia.  IR consulted by Mikey Bussing, NP for possible image-guided bone marrow biopsy/aspiration. Patient awake and alert sitting in chair with no complaints at this time. Wife at bedside. Denies fever, chills, chest pain, dyspnea, abdominal pain, or headache.   Past Medical History:  Diagnosis Date  . Anemia   . Carotid arterial disease ([place])   . CHF (congestive heart failure) ([place])   . Coronary artery disease   . Hearing loss   . HOH (hard of hearing)   . Hyperlipidemia   . Hypertension   . Nocturia   . Obesity   . Osteoarthritis    "BUE; BLE; right knee" (03/23/2017)  . Peripheral vision loss, bilateral    S/P 07/2015  . Permanent atrial fibrillation ([place])   . 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 Severe aortic stenosis   . Sinus drainage   . Stroke [facility])  07/2015   "peripheral vision still bad out of left eye" (03/23/2017)  . Torn rotator cuff 2012   Right arm  . Type II diabetes mellitus ([place])   . Urinary frequency     Past Surgical History:  Procedure Laterality Date  . BACK SURGERY    . CARDIAC CATHETERIZATION  11/29/2005   SEVERE THREE VESSEL OBSTRUCTIVE ATHERSCLEROTIC CAD. ALL GRAFTS WERE PATENT. EF 65%  . CARDIAC CATHETERIZATION  03/23/2017  . CAROTID ENDARTERECTOMY Right 01-29-08   cea  . CARPAL TUNNEL RELEASE Right   . CORONARY ARTERY BYPASS GRAFT  03/2003   X3. LIMA GRAFT TO THE LAD, SAPHENOUS VEIN GRAFT TO THE PA, AND A LEFT RADIAL GRAFT TO THEOBTUSE MARGINAL VESSEL  . ENDARTERECTOMY Left 05/28/2012   Procedure: ENDARTERECTOMY CAROTID;  Surgeon: Elam Dutch, MD;  Location: [place];  Service: Vascular;  Laterality: Left;  . INSERT / REPLACE / REMOVE PACEMAKER  03/23/2017  . [facility]; 1984  . MOLE SURGERY     "? face/arms"  . PACEMAKER IMPLANT N/A 03/23/2017   Procedure: PACEMAKER IMPLANT;  Surgeon: Constance Haw, MD;  Location: [place] CV LAB;  Service: Cardiovascular;  Laterality: N/A;  . PATCH ANGIOPLASTY Left 05/28/2012   Procedure: WITH dACRON PATCH ANGIOPLASTY;  Surgeon: Elam Dutch, MD;  Location: [place];  Service: Vascular;  Laterality: Left;  . RIGHT/LEFT HEART CATH AND CORONARY/GRAFT ANGIOGRAPHY N/A 03/23/2017   Procedure: RIGHT/LEFT HEART CATH AND CORONARY/GRAFT ANGIOGRAPHY;  Surgeon: Sherren Mocha, MD;  Location: [place] CV LAB;  Service: Cardiovascular;  Laterality: N/A;  . TEE WITHOUT CARDIOVERSION N/A 05/09/2017  Procedure: TRANSESOPHAGEAL ECHOCARDIOGRAM (TEE);  Surgeon: Sherren Mocha, MD;  Location: [place];  Service: Open Heart Surgery;  Laterality: N/A;  . TRANSCATHETER AORTIC VALVE REPLACEMENT, TRANSFEMORAL N/A 05/09/2017   Procedure: TRANSCATHETER AORTIC VALVE REPLACEMENT, TRANSFEMORAL using an Edwards 76m Sapien 3 Aortic Valve;  Surgeon: CSherren Mocha MD;  [place]  Service: Open Heart Surgery;  Laterality: N/A;    Allergies: Niacin, Niacin and related, Metformin and related, and Statins  Medications: Prior to Admission medications   Medication Sig Start Date End Date Taking? Authorizing Provider  acetaminophen (TYLENOL) 650 MG CR tablet Take 1,300 mg by mouth every 8 (eight) hours as needed for pain.    Yes [provider]  amLODipine (NORVASC) 2.5 MG tablet Take 1 tablet (2.5 mg total) by mouth daily. 11/27/19 02/25/20 Yes JMartinique [name] M, MD  apixaban (ELIQUIS) 5 MG TABS tablet Take 1 tablet (5 mg total) by mouth every 12 (twelve) hours. 11/27/19  Yes JMartinique [name] M, MD  benazepril (LOTENSIN) 40 MG tablet TAKE 1 TABLET BY MOUTH EVERY DAY Patient taking differently: Take 40 mg by mouth daily.  01/14/19  Yes JMartinique [name] M, MD  cyanocobalamin 1000 MCG tablet Take 1,000 mcg by mouth daily.    Yes [provider]  ezetimibe (ZETIA) 10 MG tablet TAKE 1 TABLET BY MOUTH EVERY DAY Patient taking differently: Take 10 mg by mouth daily.  01/08/19  Yes JMartinique [name] M, MD  furosemide (LASIX) 40 MG tablet Take 40 mg by mouth daily.    Yes [provider]  glimepiride (AMARYL) 2 MG tablet TAKE 1 TABLET (2 MG TOTAL) BY MOUTH DAILY WITH BREAKFAST Patient taking differently: Take 2 mg by mouth daily with breakfast.  01/25/18  Yes JMartinique [name] M, MD  hydrALAZINE (APRESOLINE) 25 MG tablet Take 1 tablet (25 mg total) by mouth 2 (two) times daily. 06/10/19  Yes JMartinique [name] M, MD  hydrochlorothiazide (HYDRODIURIL) 25 MG tablet TAKE 1 TABLET BY MOUTH EVERY DAY IN THE MORNING Patient taking differently: Take 25 mg by mouth daily.  10/03/18  Yes Bovard-Stuckert, Jody, MD  loratadine (CLARITIN) 10 MG tablet Take 10 mg by mouth daily.   Yes [provider]  omeprazole (PRILOSEC) 20 MG capsule Take 2 capsules (40 mg total) by mouth daily. 10/17/19  Yes JMartinique [name] M, MD  Polyvinyl Alcohol-Povidone (REFRESH OP) Place 1 drop into both eyes  daily as needed (for dry eyes).    Yes [provider]  tamsulosin (FLOMAX) 0.4 MG CAPS capsule TAKE 1 CAPSULE BY MOUTH ONCE DAILY Patient taking differently: Take 0.4 mg by mouth daily.  09/21/15  Yes JMartinique [name] M, MD     Family History  Problem Relation Age of Onset  . Heart attack Father   . Heart attack Brother   . Parkinsonism Brother   . Cancer Brother   . Arrhythmia Brother     Social History   Socioeconomic History  . Marital status: Married    Spouse name: Not on file  . Number of children: 5  . Years of education: Not on file  . Highest education level: Not on file  Occupational History  . Occupation: retired    [employer] farm manager RETIRED  Tobacco Use  . Smoking status: Former Smoker    Packs/day: 2.00    Years: 39.00    Pack years: 78.00    Types: Cigarettes    Quit date: 01/24/1994    Years since quitting: 25.8  . Smokeless tobacco: Never Used  Vaping Use  . Vaping Use: Never used  Substance and Sexual Activity  . Alcohol use: No  . Drug use: No  . Sexual activity: Never  Other Topics Concern  . Not on file  Social History Narrative   [place] Farm/ in [place]; no alcohol; quit smoking- 96; worked in Estate agent.    Social Determinants of Health   Financial Resource Strain:   . Difficulty of Paying Living Expenses: Not on file  Food Insecurity:   . Worried About Charity fundraiser in the Last Year: Not on file  . Ran Out of Food in the Last Year: Not on file  Transportation Needs:   . Lack of Transportation (Medical): Not on file  . Lack of Transportation (Non-Medical): Not on file  Physical Activity:   . Days of Exercise per Week: Not on file  . Minutes of Exercise per Session: Not on file  Stress:   . Feeling of Stress : Not on file  Social Connections:   . Frequency of Communication with Friends and Family: Not on file  . Frequency of Social Gatherings with Friends and Family: Not on file  . Attends Religious Services: Not on file    . Active Member of Clubs or Organizations: Not on file  . Attends Archivist Meetings: Not on file  . Marital Status: Not on file     Review of Systems: A 12 point ROS discussed and pertinent positives are indicated in the HPI above.  All other systems are negative.  Review of Systems  Constitutional: Negative for chills and fever.  Respiratory: Negative for shortness of breath and wheezing.   Cardiovascular: Negative for chest pain and palpitations.  Gastrointestinal: Negative for abdominal pain.  Neurological: Negative for headaches.  Psychiatric/Behavioral: Negative for behavioral problems and confusion.    Vital Signs: BP (!) 126/59   Pulse 98   Temp 97.8 F (36.6 C) (Oral)   Resp 20   Ht 6' (1.829 m)   Wt 267 lb 14.4 oz (121.5 kg) Comment: scale a  SpO2 98%   BMI 36.33 kg/m   Physical Exam Vitals and nursing note reviewed.  Constitutional:      General: He is not in acute distress. Cardiovascular:     Rate and Rhythm: Normal rate and regular rhythm.     Heart sounds: Normal heart sounds. No murmur heard.   Pulmonary:     Effort: Pulmonary effort is normal. No respiratory distress.     Breath sounds: Normal breath sounds. No wheezing.  Skin:    General: Skin is warm and dry.  Neurological:     Mental Status: He is alert and oriented to person, place, and time.      MD Evaluation Airway: WNL Heart: WNL Abdomen: WNL Chest/ Lungs: WNL ASA  Classification: 3 Mallampati/Airway Score: One   Imaging: DG Chest 2 View  Result Date: 12/07/2019 CLINICAL DATA:  Shortness of breath. EXAM: CHEST - 2 VIEW COMPARISON:  03/29/2018 FINDINGS: 1016 hours. The cardio pericardial silhouette is enlarged. There is pulmonary vascular congestion without overt pulmonary edema. Diffuse interstitial opacity suggest component of underlying interstitial edema. Left single lead permanent pacemaker noted. Bones are demineralized. IMPRESSION: Enlargement of the  cardiopericardial silhouette with pulmonary vascular congestion and probable interstitial edema. Electronically Signed   By: Misty Stanley M.D.   On: 12/07/2019 10:40   ECHOCARDIOGRAM COMPLETE  Result Date: 12/08/2019    ECHOCARDIOGRAM REPORT   Patient Name:   Evan [name] [facility]  Date of Exam: 12/08/2019 Medical Rec #:  [id number]      Height:       72.0 in Accession #:    [id number]     Weight:       270.4 lb Date of Birth:  [birth year]      BSA:          2.422 m Patient Age:    37 years       BP:           135/63 mmHg Patient Gender: M              HR:           65 bpm. Exam Location:  Inpatient Procedure: 2D Echo, Cardiac Doppler and Color Doppler Indications:    CHF  History:        Patient has prior history of Echocardiogram examinations, most                 recent 08/27/2018. CHF, CAD, Pacemaker and Prior CABG, Stroke,                 Aortic Valve Disease and TAVR, Arrythmias:Atrial Fibrillation;                 Risk Factors:Hypertension, Dyslipidemia and Diabetes.  Sonographer:    Dustin Flock Referring Phys: [place]  1. Left ventricular ejection fraction, by estimation, is 45 to 50%. The left ventricle has mildly decreased function. The left ventricle demonstrates global hypokinesis. There is moderate concentric left ventricular hypertrophy. Left ventricular diastolic function could not be evaluated.  2. Right ventricular systolic function is moderately reduced. The right ventricular size is moderately enlarged. There is severely elevated pulmonary artery systolic pressure. The estimated right ventricular systolic pressure is 87.8 mmHg.  3. Left atrial size was severely dilated.  4. Right atrial size was moderately dilated.  5. The mitral valve is degenerative. Mild mitral valve regurgitation. Moderate to severe mitral annular calcification.  6. The aortic valve has been repaired/replaced. Aortic valve regurgitation is not visualized. Procedure Date: 04/29/2017. Echo findings are  consistent with normal structure and function of the aortic valve prosthesis. Aortic valve mean gradient measures  11.0 mmHg. Aortic valve Vmax measures 2.24 m/s.  7. The inferior vena cava is dilated in size with <50% respiratory variability, suggesting right atrial pressure of 15 mmHg. Comparison(s): Prior images reviewed side by side. The left ventricular function is worsened. The right ventricular systolic function is worse. Pulmonary artery systolic pressure and right atrial pressure are higher. TAVR gradients are improved (possibly  due to lower cardiac output). FINDINGS  Left Ventricle: Left ventricular ejection fraction, by estimation, is 45 to 50%. The left ventricle has mildly decreased function. The left ventricle demonstrates global hypokinesis. The left ventricular internal cavity size was normal in size. There is  moderate concentric left ventricular hypertrophy. Abnormal (paradoxical) septal motion, consistent with RV pacemaker and abnormal (paradoxical) septal motion consistent with post-operative status. Left ventricular diastolic function could not be evaluated due to atrial fibrillation. Left ventricular diastolic function could not be evaluated. Right Ventricle: The right ventricular size is moderately enlarged. Right vetricular wall thickness was not well visualized. Right ventricular systolic function is moderately reduced. There is severely elevated pulmonary artery systolic pressure. The tricuspid regurgitant velocity is 3.68 m/s, and with an assumed right atrial pressure of 15 mmHg, the estimated right ventricular systolic pressure is 67.6 mmHg. Left Atrium: Left atrial size was severely dilated. Right Atrium:  Right atrial size was moderately dilated. Pericardium: There is no evidence of pericardial effusion. Mitral Valve: The mitral valve is degenerative in appearance. Moderate to severe mitral annular calcification. Mild mitral valve regurgitation. Tricuspid Valve: The tricuspid valve is  normal in structure. Tricuspid valve regurgitation is trivial. Aortic Valve: The aortic valve has been repaired/replaced. Aortic valve regurgitation is not visualized. Aortic valve mean gradient measures 11.0 mmHg. Aortic valve peak gradient measures 20.1 mmHg. Aortic valve area, by VTI measures 1.44 cm. 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 Echo findings are consistent with normal structure and function of the aortic valve prosthesis. Pulmonic Valve: The pulmonic valve was grossly normal. Pulmonic valve regurgitation is not visualized. Aorta: The aortic root is normal in size and structure. Venous: The inferior vena cava is dilated in size with less than 50% respiratory variability, suggesting right atrial pressure of 15 mmHg. IAS/Shunts: No atrial level shunt detected by color flow Doppler. Additional Comments: A pacer wire is visualized.  LEFT VENTRICLE PLAX 2D LVIDd:         5.50 cm  Diastology LVIDs:         4.70 cm  LV e' medial:    4.46 cm/s LV PW:         1.50 cm  LV E/e' medial:  31.2 LV IVS:        1.50 cm  LV e' lateral:   5.77 cm/s LVOT diam:     2.00 cm  LV E/e' lateral: 24.1 LV SV:         67 LV SV Index:   28 LVOT Area:     3.14 cm  RIGHT VENTRICLE RV Basal diam:  5.20 cm RV S prime:     4.46 cm/s TAPSE (M-mode): 3.2 cm LEFT ATRIUM             Index       RIGHT ATRIUM           Index LA diam:        5.10 cm 2.11 cm/m  RA Area:     25.40 cm LA Vol (A2C):   93.2 ml 38.48 ml/m RA Volume:   75.50 ml  31.17 ml/m LA Vol (A4C):   89.7 ml 37.04 ml/m LA Biplane Vol: 94.7 ml 39.10 ml/m  AORTIC VALVE AV Area (Vmax):    1.46 cm AV Area (Vmean):   1.28 cm AV Area (VTI):     1.44 cm AV Vmax:           224.00 cm/s AV Vmean:          158.000 cm/s AV VTI:            0.466 m AV Peak Grad:      20.1 mmHg AV Mean Grad:      11.0 mmHg LVOT Vmax:         104.00 cm/s LVOT Vmean:        64.500 cm/s LVOT VTI:          0.213 m LVOT/AV VTI ratio: 0.46  AORTA Ao Root diam:  3.50 cm MITRAL VALVE                TRICUSPID VALVE MV Area (PHT): 4.49 cm     TR Peak grad:   54.2 mmHg MV Decel Time: 169 msec     TR Vmax:        368.00 cm/s MV E velocity: 139.00 cm/s MV A velocity:  30.60 cm/s   SHUNTS MV E/A ratio:  4.54         Systemic VTI:  0.21 m                             Systemic Diam: 2.00 cm Dani Gobble Croitoru MD Electronically signed by Sanda Klein MD Signature Date/Time: 12/08/2019/3:50:51 PM    Final    US Abdomen Limited RUQ (LIVER/GB)  Result Date: 12/07/2019 CLINICAL DATA:  Cirrhosis EXAM: ULTRASOUND ABDOMEN LIMITED RIGHT UPPER QUADRANT COMPARISON:  April 09, 2017 FINDINGS: Gallbladder: No gallstones visualized. Mild diffuse gallbladder wall thickening the relatively decompressed gallbladder up to 5 mm. No sonographic Murphy sign noted by sonographer. Common bile duct: Diameter: 5 mm, within normal limits Liver: Mildly nodular contour of the liver. Liver echogenicity is heterogeneous. Small volume ascites. Portal vein is patent on color Doppler imaging with normal direction of blood flow towards the liver. Other: None. IMPRESSION: 1. 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 No focal hepatic lesion identified sonographically. 2. Mild diffuse gallbladder wall thickening is most likely due to volume status or hypoalbuminemia. Electronically Signed   By: Valentino Saxon MD   On: 12/07/2019 17:02    Labs:  CBC: Recent Labs    12/07/19 1015 12/07/19 2005 12/08/19 0749 12/09/19 0100  WBC 5.7 5.6 5.9  5.7 5.8  HGB 6.4* 7.3* 8.8*  8.8* 8.3*  HCT 21.5* 24.5* 28.3*  27.8* 26.8*  PLT 265 245 262  266 257    COAGS: Recent Labs    12/08/19 1534  INR 1.4*    BMP: Recent Labs    03/28/19 1416 03/28/19 1416 12/06/19 1630 12/07/19 1015 12/08/19 0749 12/09/19 0100  NA 143   < > 141 139 139 141  K 5.3*   < > 4.4 3.9 4.1 3.7  CL 105   < > 98 99 99 100  CO2 22   < > '28 28 28 28  ' GLUCOSE 183*   < > 155* 196* 147*  116*  BUN 30*   < > 54* 57* 53* 46*  CALCIUM 9.0   < > 9.0 9.0 9.4 9.2  CREATININE 1.49*   < > 2.32* 2.48* 2.25* 2.09*  GFRNONAA 45*   < > 26* 26* 29* 32*  GFRAA 52*  --  30*  --   --   --    < > = values in this interval not displayed.    LIVER FUNCTION TESTS: Recent Labs    12/07/19 1844 12/08/19 0749 12/09/19 0100  BILITOT 2.3* 3.1* 2.9*  AST '15 16 17  ' ALT '17 18 15  ' ALKPHOS 127* 128* 104  PROT 6.6 6.6 6.0*  ALBUMIN 3.8 3.9 3.6     Assessment and Plan:  Progressive anemia. Plan for image-guided bone marrow biopsy/aspiration in IR tentatively for Wednesday 12/11/2019 pending IR scheduling (patient currently scheduled for EGD/colonoscopy 12/10/2019). Patient will be NPO at midnight prior to IR procedure. Afebrile. CBC with differential ordered for Wednesday AM.  Risks and benefits discussed with the patient including, but not limited to bleeding, infection, damage to adjacent structures or low yield requiring additional tests. All of the patient's questions were answered, patient is agreeable to proceed. Consent signed and in chart.   Thank you for this interesting consult.  I greatly enjoyed meeting LEGRANDE HAO and look forward to participating in their care.  A copy of this report was sent to the requesting  provider on this date.  Electronically Signed: Earley Abide, PA-C 12/09/2019, 3:08 PM   I spent a total of 20 Minutes in face to face in clinical consultation, greater than 50% of which was counseling/coordinating care for progressive anemia/bone marrow biopsy and aspiration.

## 2019-12-09 NOTE — Plan of Care (Signed)

## 2019-12-09 NOTE — Progress Notes (Signed)
H&H ordered for 15:30 pm

## 2019-12-09 NOTE — Progress Notes (Signed)
Subjective:   [name] is a 79 year old male with past medical history significant for chronic macrocytic anemia, diastolic CHF (EF of 45-99% on 08/2018), persistent atrial fibrillation (s/p pacemaker, on Eliquis), cirrhosis, HTN, HLD, CAD s/p CABG 2005, carotid artery disease, aortic stenosis s/p TAVR, prior CVA (right PCA), T2DM, and chronic use of 2L O2 via [place] who presented to the [facility] ED on 11/13 due to shortness of breath found to have progression of his chronic macrocytic anemia, acute on chronic CHF exacerbation as well as acute on chronic kidney disease.   Overnight, no acute events.  This morning, patient's wife reports that patient had a rough night with waking up with confusion about where he was located. Otherwise, she reports that his breathing is better. Patient reports having no new or worsening complaints. Patient and patient's wife still desire to continue discussing with hematology the risks and benefits of undergoing a bone marrow biopsy following endoscopic evaluation.   Objective:  Vital signs in last 24 hours: Vitals:   12/08/19 1643 12/08/19 2010 12/09/19 0105 12/09/19 0416  BP: (!) 137/54 140/69 (!) 130/57 112/61  Pulse: 68 71 64 63  Resp: _0 Temp: 98 F (36.7 C) 98.8 F (37.1 C) 98.5 F (36.9 C) 98.8 F (37.1 C)  TempSrc: Oral Oral Oral Oral  SpO2: 94% 96% 97% 95%  Weight:   121.5 kg   Height:      SpO2: 95 % O2 Flow Rate (L/min): 2 L/min  Intake/Output Summary (Last 24 hours) at 12/09/2019 0650 Last data filed at 12/09/2019 0417 Gross per 24 hour  Intake 1460 ml  Output 1500 ml  Net -40 ml   Filed Weights   12/07/19 2234 12/08/19 0500 12/09/19 0105  Weight: 122.3 kg 122.7 kg 121.5 kg  Physical Exam Vitals and nursing note reviewed.  Constitutional:      General: He is not in acute distress.    Appearance: He is obese.  HENT:     Head: Normocephalic and atraumatic.  Cardiovascular:     Rate and Rhythm: Normal rate and  regular rhythm.  Pulmonary:     Effort: Pulmonary effort is normal. No respiratory distress.     Breath sounds: Normal breath sounds.  Abdominal:     General: Bowel sounds are normal.     Palpations: Abdomen is soft.     Tenderness: There is no abdominal tenderness.  Musculoskeletal:        General: Normal range of motion.     Cervical back: Normal range of motion and neck supple.     Right lower leg: Edema present.     Left lower leg: Edema present.     Comments: Trace pitting edema of bilateral lower extremities extending to the knees  Skin:    General: Skin is warm and dry.     Capillary Refill: Capillary refill takes less than 2 seconds.  Neurological:     General: No focal deficit present.     Mental Status: He is alert.  Psychiatric:        Mood and Affect: Mood normal.        Behavior: Behavior normal.    CBC Latest Ref Rng & Units 12/09/2019 12/08/2019 12/08/2019  WBC 4.0 - 10.5 K/uL 5.8 5.9 5.7  Hemoglobin 13.0 - 17.0 g/dL 8.3(L) 8.8(L) 8.8(L)  Hematocrit 39 - 52 % 26.8(L) 28.3(L) 27.8(L)  Platelets 150 - 400 K/uL 257 262 266   CMP Latest Ref Rng &  Units 12/09/2019 12/08/2019 12/07/2019  Glucose 70 - 99 mg/dL 116(H) 147(H) 196(H)  BUN 8 - 23 mg/dL 46(H) 53(H) 57(H)  Creatinine 0.61 - 1.24 mg/dL 2.09(H) 2.25(H) 2.48(H)  Sodium 135 - 145 mmol/L 141 139 139  Potassium 3.5 - 5.1 mmol/L 3.7 4.1 3.9  Chloride 98 - 111 mmol/L 100 99 99  CO2 22 - 32 mmol/L _0 Calcium 8.9 - 10.3 mg/dL 9.2 9.4 9.0  Total Protein 6.5 - 8.1 g/dL 6.0(L) 6.6 6.6  Total Bilirubin 0.3 - 1.2 mg/dL 2.9(H) 3.1(H) 2.3(H)  Alkaline Phos 38 - 126 U/L 104 128(H) 127(H)  AST 15 - 41 U/L _1 ALT 0 - 44 U/L _2 Haptoglobin - pending AFP tumor marker - pending Hemoglobin A1c - 6.6 LDH - 190 Direct bilirubin - 0.7 Indirect bilirubin 1.6 Folate - 11.8 Ferritin - 71 Iron - 251 TIBC - 398 Saturation ratios - 63% UIBC - 147 Vitamin B12 - 1,433 Technologist smear - Ovalocytes,  teardrop cells, schistocytes Pathologist smear - pending Reticulocytes: -Retic Ct Pct 2.3 -RBC 1.89 -Retic count, absolute 42.5 -Immature Retic Fract 22.2  DG Chest 2 View  Result Date: 12/07/2019 CLINICAL DATA:  Shortness of breath. EXAM: CHEST - 2 VIEW COMPARISON:  03/29/2018 FINDINGS: 1016 hours. The cardio pericardial silhouette is enlarged. There is pulmonary vascular congestion without overt pulmonary edema. Diffuse interstitial opacity suggest component of underlying interstitial edema. Left single lead permanent pacemaker noted. Bones are demineralized. IMPRESSION: Enlargement of the cardiopericardial silhouette with pulmonary vascular congestion and probable interstitial edema. Electronically Signed   By: Misty Stanley M.D.   On: 12/07/2019 10:40   ECHOCARDIOGRAM COMPLETE  Result Date: 12/08/2019    ECHOCARDIOGRAM REPORT   Patient Name:   Evan [name] [facility] Date of Exam: 12/08/2019 Medical Rec #:  [id number]      Height:       72.0 in Accession #:    [id number]     Weight:       270.4 lb Date of Birth:  11/25/1940      BSA:          2.422 m Patient Age:    19 years       BP:           135/63 mmHg Patient Gender: M              HR:           65 bpm. Exam Location:  Inpatient Procedure: 2D Echo, Cardiac Doppler and Color Doppler Indications:    CHF  History:        Patient has prior history of Echocardiogram examinations, most                 recent 08/27/2018. CHF, CAD, Pacemaker and Prior CABG, Stroke,                 Aortic Valve Disease and TAVR, Arrythmias:Atrial Fibrillation;                 Risk Factors:Hypertension, Dyslipidemia and Diabetes.  Sonographer:    Dustin Flock Referring Phys: [place]  1. Left ventricular ejection fraction, by estimation, is 45 to 50%. The left ventricle has mildly decreased function. The left ventricle demonstrates global hypokinesis. There is moderate concentric left ventricular hypertrophy. Left ventricular diastolic function could  not be evaluated.  2. Right ventricular systolic function is moderately reduced. The right ventricular size is moderately enlarged.  There is severely elevated pulmonary artery systolic pressure. The estimated right ventricular systolic pressure is 16.1 mmHg.  3. Left atrial size was severely dilated.  4. Right atrial size was moderately dilated.  5. The mitral valve is degenerative. Mild mitral valve regurgitation. Moderate to severe mitral annular calcification.  6. The aortic valve has been repaired/replaced. Aortic valve regurgitation is not visualized. Procedure Date: 04/29/2017. Echo findings are consistent with normal structure and function of the aortic valve prosthesis. Aortic valve mean gradient measures  11.0 mmHg. Aortic valve Vmax measures 2.24 m/s.  7. The inferior vena cava is dilated in size with <50% respiratory variability, suggesting right atrial pressure of 15 mmHg. Comparison(s): Prior images reviewed side by side. The left ventricular function is worsened. The right ventricular systolic function is worse. Pulmonary artery systolic pressure and right atrial pressure are higher. TAVR gradients are improved (possibly  due to lower cardiac output). FINDINGS  Left Ventricle: Left ventricular ejection fraction, by estimation, is 45 to 50%. The left ventricle has mildly decreased function. The left ventricle demonstrates global hypokinesis. The left ventricular internal cavity size was normal in size. There is  moderate concentric left ventricular hypertrophy. Abnormal (paradoxical) septal motion, consistent with RV pacemaker and abnormal (paradoxical) septal motion consistent with post-operative status. Left ventricular diastolic function could not be evaluated due to atrial fibrillation. Left ventricular diastolic function could not be evaluated. Right Ventricle: The right ventricular size is moderately enlarged. Right vetricular wall thickness was not well visualized. Right ventricular systolic  function is moderately reduced. There is severely elevated pulmonary artery systolic pressure. The tricuspid regurgitant velocity is 3.68 m/s, and with an assumed right atrial pressure of 15 mmHg, the estimated right ventricular systolic pressure is 09.6 mmHg. Left Atrium: Left atrial size was severely dilated. Right Atrium: Right atrial size was moderately dilated. Pericardium: There is no evidence of pericardial effusion. Mitral Valve: The mitral valve is degenerative in appearance. Moderate to severe mitral annular calcification. Mild mitral valve regurgitation. Tricuspid Valve: The tricuspid valve is normal in structure. Tricuspid valve regurgitation is trivial. Aortic Valve: The aortic valve has been repaired/replaced. Aortic valve regurgitation is not visualized. Aortic valve mean gradient measures 11.0 mmHg. Aortic valve peak gradient measures 20.1 mmHg. Aortic valve area, by VTI measures 1.44 cm. 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 Echo findings are consistent with normal structure and function of the aortic valve prosthesis. Pulmonic Valve: The pulmonic valve was grossly normal. Pulmonic valve regurgitation is not visualized. Aorta: The aortic root is normal in size and structure. Venous: The inferior vena cava is dilated in size with less than 50% respiratory variability, suggesting right atrial pressure of 15 mmHg. IAS/Shunts: No atrial level shunt detected by color flow Doppler. Additional Comments: A pacer wire is visualized.  LEFT VENTRICLE PLAX 2D LVIDd:         5.50 cm  Diastology LVIDs:         4.70 cm  LV e' medial:    4.46 cm/s LV PW:         1.50 cm  LV E/e' medial:  31.2 LV IVS:        1.50 cm  LV e' lateral:   5.77 cm/s LVOT diam:     2.00 cm  LV E/e' lateral: 24.1 LV SV:         67 LV SV Index:   28 LVOT Area:     3.14 cm  RIGHT VENTRICLE RV Basal  diam:  5.20 cm RV S prime:     4.46 cm/s TAPSE (M-mode): 3.2 cm LEFT ATRIUM             Index        RIGHT ATRIUM           Index LA diam:        5.10 cm 2.11 cm/m  RA Area:     25.40 cm LA Vol (A2C):   93.2 ml 38.48 ml/m RA Volume:   75.50 ml  31.17 ml/m LA Vol (A4C):   89.7 ml 37.04 ml/m LA Biplane Vol: 94.7 ml 39.10 ml/m  AORTIC VALVE AV Area (Vmax):    1.46 cm AV Area (Vmean):   1.28 cm AV Area (VTI):     1.44 cm AV Vmax:           224.00 cm/s AV Vmean:          158.000 cm/s AV VTI:            0.466 m AV Peak Grad:      20.1 mmHg AV Mean Grad:      11.0 mmHg LVOT Vmax:         104.00 cm/s LVOT Vmean:        64.500 cm/s LVOT VTI:          0.213 m LVOT/AV VTI ratio: 0.46  AORTA Ao Root diam: 3.50 cm MITRAL VALVE                TRICUSPID VALVE MV Area (PHT): 4.49 cm     TR Peak grad:   54.2 mmHg MV Decel Time: 169 msec     TR Vmax:        368.00 cm/s MV E velocity: 139.00 cm/s MV A velocity: 30.60 cm/s   SHUNTS MV E/A ratio:  4.54         Systemic VTI:  0.21 m                             Systemic Diam: 2.00 cm Dani Gobble Croitoru MD Electronically signed by Sanda Klein MD Signature Date/Time: 12/08/2019/3:50:51 PM    Final    US Abdomen Limited RUQ (LIVER/GB)  Result Date: 12/07/2019 CLINICAL DATA:  Cirrhosis EXAM: ULTRASOUND ABDOMEN LIMITED RIGHT UPPER QUADRANT COMPARISON:  April 09, 2017 FINDINGS: Gallbladder: No gallstones visualized. Mild diffuse gallbladder wall thickening the relatively decompressed gallbladder up to 5 mm. No sonographic Murphy sign noted by sonographer. Common bile duct: Diameter: 5 mm, within normal limits Liver: Mildly nodular contour of the liver. Liver echogenicity is heterogeneous. Small volume ascites. Portal vein is patent on color Doppler imaging with normal direction of blood flow towards the liver. Other: None. IMPRESSION: 1. 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 No focal hepatic lesion identified sonographically. 2. Mild diffuse gallbladder wall thickening is most likely due to volume status or hypoalbuminemia. Electronically  Signed   By: Valentino Saxon MD   On: 12/07/2019 17:02   Assessment/Plan:  Active Problems:   Symptomatic anemia   Cirrhosis [facility])  [name] is a 79 year old male with past medical history significant for chronic macrocytic anemia, diastolic CHF (last EF of 65-46% on 08/2018), persistent atrial fibrillation (s/p pacemaker, on Eliquis), cirrhosis, HTN, HLD, CAD s/p CABG 2005, carotid artery disease, aortic stenosis s/p TAVR, prior CVA (right PCA), T2DM, and chronic use of 2L O2 via [place] who presented to the [facility] on 11/13 due to shortness of breath found to have progression of his chronic macrocytic anemia, acute on chronic CHF exacerbation as well as acute on chronic kidney disease.   #Symptomatic macrocytic anemia, active Patient underwent significant laboratory workup with Dr. Rogue Bussing for macrocytic anemia in March of 2021 with recommendation to undergo bone marrow biopsy and colonoscopy, however patient deferred procedures. Hemoglobin noted to be 6.4 on admission, down from prior of 9.5 in March. Patient's anemia likely significantly contributing to his shortness of breath, lightheadedness, and fatigue. The etiology of his anemia is unknown at this time, however he does have schistocytes, ovalocytes and teardrop cells on his peripheral smear. Remaining laboratory workup unrevealing for a deficiency or hemolytic cause. He responded well to blood transfusions (s/p 3 units) with hemoglobin most recently of 8.3. Cardiology and hematology recommending transfusion goal greater than 9, therefore we will transfuse an additional unit of blood today. GI consulted and recommending endoscopy/colonoscopy tomorrow. Hematology/oncology following and recommending bone marrow biopsy to workup possible myelodysplastic syndrome. -Follow-up haptoglobin and pathologist smear results -Daily CBC, transfuse if Hb <9 -Continue home vitamin B12 supplementation, although B12 level elevated -Continue home  protonix 40mg  daily -Cardiology cleared patient for endoscopy/colonoscopy which patient will undergo tomorrow -Hematology/oncology offering patient inpatient bone marrow biopsy on Wednesday/Thursday, however patient and patient's wife unsure if desire this procedure  #Acute on chronic diastolic heart failure exacerbation, active Patient with history of diastolic CHF (EF 78-46% [id number]/6293). He has been following closely with cardiology for management of this condition and was previously on 40mg  lasix daily with escalation of his lasix dose to 40mg  twice daily two days prior to arrival. His Pro-BNP is significantly elevated to 9,423 with radiologic evidence of pulmonary vascular congestion and interstitial edema. He received 40mg  IV lasix twice daily for the first 36 hours of his hospital course with appropriate response with transition to 40mg  IV lasix once daily per cardiology recommendations. Repeat echocardiogram revealing LVEF of 45-50% with global hypokinesis and severely elevated pulmonary artery systolic pressure. Patient more euvolemic on examination today. -Continue IV lasix 40mg  daily -Strict intake and output -Daily weights -Continue home supplemental oxygen of 2L -Cardiology signed off with plan to follow-up with Dr. 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 Creatinine improving, most recently 2.09 following blood transfusions, continued diuresis and holding of home ACE-inhibitor and thiazide diuretic. -Daily BMP, monitor creatinine -Avoid nephrotoxins -Holding home benazepril -Holding home HCTZ  #Atrial fibrillation s/p pacemaker, chronic Patient with history of persistent atrial fibrillation s/p pacemaker placement in normal sinus rhythm on admission. Patient's last dose of Eliquis was evening prior to admission. Currently holding as patient may undergo  colonoscopy/endoscopy shortly. -Holding home Eliquis 5mg  twice daily -Telemetry  #HTN, chronic Patient's home blood pressure medication regimen includes amlodipine 2.5mg  daily, benazepril 40mg  daily, hydralazine 25mg  twice daily, and HCTZ 25mg  daily. -Continue amlodipine 2.5mg  daily -Holding home benazepril 40mg  daily -Continue hydralazine 25mg  twice daily -Holding home HCTZ 25mg  daily  #Cirrhosis, chronic Patient noted to have shrunken appearance and nodular contour of the liver suggestive of cirrhosis on CTA Abdomen Pelvis in March 2019. Patient has no history of alcoholism and hepatitis B and C laboratory workup was unremarkable. RUQ ultrasound consistent with prior. -GI consulted, appreciate recommendations -follow-up alpha-fetoprotein -follow-up PT/INR  #T2DM, chronic Hemoglobin A1c of 6.6 on admission. -Moderate SSI -CBG monitoring 4 times  daily, before meals and at bedtime  #HLD, chronic -Continue home zetia 77m daily  #Code status: Full code #VTE ppx: SCDs #Diet: Clear liquid diet #IVF: None #Bowel regimen: Miralax twice daily   JCato Mulligan MD 12/09/2019, 6:50 AM Pager: [phone] 5pm on weekdays and 1pm on weekends: On Call pager [phone]

## 2019-12-09 NOTE — Progress Notes (Addendum)
HEMATOLOGY-ONCOLOGY PROGRESS NOTE  SUBJECTIVE: The patient has no specific complaints today.  His wife is at the bedside and tells me that they have decided to proceed with a bone marrow biopsy.  Breathing stable.  No bleeding has been noted.  For EGD and colonoscopy 11/16.  REVIEW OF SYSTEMS:   Constitutional: Denies fevers, chills, positive for fatigue Eyes: Denies blurriness of vision Ears, nose, mouth, throat, and face: Denies mucositis or sore throat Respiratory: No shortness of breath but reports dyspnea on exertion Cardiovascular: Denies palpitation, chest discomfort Gastrointestinal:  Denies nausea, heartburn or change in bowel habits Skin: Denies abnormal skin rashes Lymphatics: Denies new lymphadenopathy or easy bruising Neurological:Denies numbness, tingling or new weaknesses Behavioral/Psych: Mood is stable, no new changes  Extremities: Reports lower extremity edema All other systems were reviewed with the patient and are negative.  I have reviewed the past medical history, past surgical history, social history and family history with the patient and they are unchanged from previous note.   PHYSICAL EXAMINATION: ECOG PERFORMANCE STATUS: 3 - Symptomatic, >50% confined to bed  Vitals:   12/09/19 0416 12/09/19 0742  BP: 112/61 (!) 113/55  Pulse: 63 65  Resp: 18 20  Temp: 98.8 F (37.1 C) (!) 97.4 F (36.3 C)  SpO2: 95% 99%   Filed Weights   12/07/19 2234 12/08/19 0500 12/09/19 0105  Weight: 122.3 kg 122.7 kg 121.5 kg    Intake/Output from previous day: 11/14 0701 - 11/15 0700 In: 1460 [P.O.:1460] Out: 1500 [Urine:1500]  GENERAL:alert, no distress and comfortable SKIN: skin color, texture, turgor are normal, no rashes or significant lesions EYES: normal, Conjunctiva are pink and non-injected, sclera clear OROPHARYNX:no exudate, no erythema and lips, buccal mucosa, and tongue normal  LUNGS: clear to auscultation and percussion with normal breathing  effort HEART: regular rate & rhythm and no murmurs and trace lower extremity edema ABDOMEN:abdomen soft, non-tender and normal bowel sounds Musculoskeletal:no cyanosis of digits and no clubbing  NEURO: alert & oriented x 3 with fluent speech, no focal motor/sensory deficits  LABORATORY DATA:  I have reviewed the data as listed CMP Latest Ref Rng & Units 12/09/2019 12/08/2019 12/07/2019  Glucose 70 - 99 mg/dL 116(H) 147(H) 196(H)  BUN 8 - 23 mg/dL 46(H) 53(H) 57(H)  Creatinine 0.61 - 1.24 mg/dL 2.09(H) 2.25(H) 2.48(H)  Sodium 135 - 145 mmol/L 141 139 139  Potassium 3.5 - 5.1 mmol/L 3.7 4.1 3.9  Chloride 98 - 111 mmol/L 100 99 99  CO2 22 - 32 mmol/L '28 28 28  ' Calcium 8.9 - 10.3 mg/dL 9.2 9.4 9.0  Total Protein 6.5 - 8.1 g/dL 6.0(L) 6.6 6.6  Total Bilirubin 0.3 - 1.2 mg/dL 2.9(H) 3.1(H) 2.3(H)  Alkaline Phos 38 - 126 U/L 104 128(H) 127(H)  AST 15 - 41 U/L '17 16 15  ' ALT 0 - 44 U/L '15 18 17    ' Lab Results  Component Value Date   WBC 5.8 12/09/2019   HGB 8.3 (L) 12/09/2019   HCT 26.8 (L) 12/09/2019   MCV 103.5 (H) 12/09/2019   PLT 257 12/09/2019   NEUTROABS 3.3 04/16/2019    DG Chest 2 View  Result Date: 12/07/2019 CLINICAL DATA:  Shortness of breath. EXAM: CHEST - 2 VIEW COMPARISON:  03/29/2018 FINDINGS: 1016 hours. The cardio pericardial silhouette is enlarged. There is pulmonary vascular congestion without overt pulmonary edema. Diffuse interstitial opacity suggest component of underlying interstitial edema. Left single lead permanent pacemaker noted. Bones are demineralized. IMPRESSION: Enlargement of the cardiopericardial silhouette with pulmonary  vascular congestion and probable interstitial edema. Electronically Signed   By: Misty Stanley M.D.   On: 12/07/2019 10:40   ECHOCARDIOGRAM COMPLETE  Result Date: 12/08/2019    ECHOCARDIOGRAM REPORT   Patient Name:   Evan [name] [facility] Date of Exam: 12/08/2019 Medical Rec #:  [id number]      Height:       72.0 in Accession #:     [id number]     Weight:       270.4 lb Date of Birth:  1940/03/27      BSA:          2.422 m Patient Age:    79 years       BP:           135/63 mmHg Patient Gender: M              HR:           65 bpm. Exam Location:  Inpatient Procedure: 2D Echo, Cardiac Doppler and Color Doppler Indications:    CHF  History:        Patient has prior history of Echocardiogram examinations, most                 recent 08/27/2018. CHF, CAD, Pacemaker and Prior CABG, Stroke,                 Aortic Valve Disease and TAVR, Arrythmias:Atrial Fibrillation;                 Risk Factors:Hypertension, Dyslipidemia and Diabetes.  Sonographer:    Dustin Flock Referring Phys: [place]  1. Left ventricular ejection fraction, by estimation, is 45 to 50%. The left ventricle has mildly decreased function. The left ventricle demonstrates global hypokinesis. There is moderate concentric left ventricular hypertrophy. Left ventricular diastolic function could not be evaluated.  2. Right ventricular systolic function is moderately reduced. The right ventricular size is moderately enlarged. There is severely elevated pulmonary artery systolic pressure. The estimated right ventricular systolic pressure is 35.0 mmHg.  3. Left atrial size was severely dilated.  4. Right atrial size was moderately dilated.  5. The mitral valve is degenerative. Mild mitral valve regurgitation. Moderate to severe mitral annular calcification.  6. The aortic valve has been repaired/replaced. Aortic valve regurgitation is not visualized. Procedure Date: 04/29/2017. Echo findings are consistent with normal structure and function of the aortic valve prosthesis. Aortic valve mean gradient measures  11.0 mmHg. Aortic valve Vmax measures 2.24 m/s.  7. The inferior vena cava is dilated in size with <50% respiratory variability, suggesting right atrial pressure of 15 mmHg. Comparison(s): Prior images reviewed side by side. The left ventricular function is  worsened. The right ventricular systolic function is worse. Pulmonary artery systolic pressure and right atrial pressure are higher. TAVR gradients are improved (possibly  due to lower cardiac output). FINDINGS  Left Ventricle: Left ventricular ejection fraction, by estimation, is 45 to 50%. The left ventricle has mildly decreased function. The left ventricle demonstrates global hypokinesis. The left ventricular internal cavity size was normal in size. There is  moderate concentric left ventricular hypertrophy. Abnormal (paradoxical) septal motion, consistent with RV pacemaker and abnormal (paradoxical) septal motion consistent with post-operative status. Left ventricular diastolic function could not be evaluated due to atrial fibrillation. Left ventricular diastolic function could not be evaluated. Right Ventricle: The right ventricular size is moderately enlarged. Right vetricular wall thickness was not well visualized. Right ventricular systolic function is moderately reduced. There  is severely elevated pulmonary artery systolic pressure. The tricuspid regurgitant velocity is 3.68 m/s, and with an assumed right atrial pressure of 15 mmHg, the estimated right ventricular systolic pressure is 62.8 mmHg. Left Atrium: Left atrial size was severely dilated. Right Atrium: Right atrial size was moderately dilated. Pericardium: There is no evidence of pericardial effusion. Mitral Valve: The mitral valve is degenerative in appearance. Moderate to severe mitral annular calcification. Mild mitral valve regurgitation. Tricuspid Valve: The tricuspid valve is normal in structure. Tricuspid valve regurgitation is trivial. Aortic Valve: The aortic valve has been repaired/replaced. Aortic valve regurgitation is not visualized. Aortic valve mean gradient measures 11.0 mmHg. Aortic valve peak gradient measures 20.1 mmHg. Aortic valve area, by VTI measures 1.44 cm. 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 Echo findings are consistent with normal structure and function of the aortic valve prosthesis. Pulmonic Valve: The pulmonic valve was grossly normal. Pulmonic valve regurgitation is not visualized. Aorta: The aortic root is normal in size and structure. Venous: The inferior vena cava is dilated in size with less than 50% respiratory variability, suggesting right atrial pressure of 15 mmHg. IAS/Shunts: No atrial level shunt detected by color flow Doppler. Additional Comments: A pacer wire is visualized.  LEFT VENTRICLE PLAX 2D LVIDd:         5.50 cm  Diastology LVIDs:         4.70 cm  LV e' medial:    4.46 cm/s LV PW:         1.50 cm  LV E/e' medial:  31.2 LV IVS:        1.50 cm  LV e' lateral:   5.77 cm/s LVOT diam:     2.00 cm  LV E/e' lateral: 24.1 LV SV:         67 LV SV Index:   28 LVOT Area:     3.14 cm  RIGHT VENTRICLE RV Basal diam:  5.20 cm RV S prime:     4.46 cm/s TAPSE (M-mode): 3.2 cm LEFT ATRIUM             Index       RIGHT ATRIUM           Index LA diam:        5.10 cm 2.11 cm/m  RA Area:     25.40 cm LA Vol (A2C):   93.2 ml 38.48 ml/m RA Volume:   75.50 ml  31.17 ml/m LA Vol (A4C):   89.7 ml 37.04 ml/m LA Biplane Vol: 94.7 ml 39.10 ml/m  AORTIC VALVE AV Area (Vmax):    1.46 cm AV Area (Vmean):   1.28 cm AV Area (VTI):     1.44 cm AV Vmax:           224.00 cm/s AV Vmean:          158.000 cm/s AV VTI:            0.466 m AV Peak Grad:      20.1 mmHg AV Mean Grad:      11.0 mmHg LVOT Vmax:         104.00 cm/s LVOT Vmean:        64.500 cm/s LVOT VTI:          0.213 m LVOT/AV VTI ratio: 0.46  AORTA Ao Root diam: 3.50 cm MITRAL VALVE                TRICUSPID VALVE MV Area (  PHT): 4.49 cm     TR Peak grad:   54.2 mmHg MV Decel Time: 169 msec     TR Vmax:        368.00 cm/s MV E velocity: 139.00 cm/s MV A velocity: 30.60 cm/s   SHUNTS MV E/A ratio:  4.54         Systemic VTI:  0.21 m                             Systemic Diam: 2.00 cm Dani Gobble Croitoru MD Electronically signed by Sanda Klein MD Signature Date/Time: 12/08/2019/3:50:51 PM    Final    US Abdomen Limited RUQ (LIVER/GB)  Result Date: 12/07/2019 CLINICAL DATA:  Cirrhosis EXAM: ULTRASOUND ABDOMEN LIMITED RIGHT UPPER QUADRANT COMPARISON:  April 09, 2017 FINDINGS: Gallbladder: No gallstones visualized. Mild diffuse gallbladder wall thickening the relatively decompressed gallbladder up to 5 mm. No sonographic Murphy sign noted by sonographer. Common bile duct: Diameter: 5 mm, within normal limits Liver: Mildly nodular contour of the liver. Liver echogenicity is heterogeneous. Small volume ascites. Portal vein is patent on color Doppler imaging with normal direction of blood flow towards the liver. Other: None. IMPRESSION: 1. 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 No focal hepatic lesion identified sonographically. 2. Mild diffuse gallbladder wall thickening is most likely due to volume status or hypoalbuminemia. Electronically Signed   By: Valentino Saxon MD   On: 12/07/2019 17:02    ASSESSMENT AND PLAN: 79 year old Caucasian male with extensive heart disease, on Eliquis for atrial fibrillation, liver cirrhosis, was admitted for worsening anemia  1. macrocytic anemia, s/p 4 units blood transfusion 2. CHF exacerbation, precipitated by severe anemia 3. Acute on chronic kidney disease, stage III 4. Coronary artery disease, status post CABG 5. Atrial fibrillation status post pacemaker, on chronic eliquis  6. HTN 7. Liver cirrhosis, etiology has not been worked up  8. T2DM   Recommendations: -Lab reviewed, he developed worsening microcytic anemia, with low reticulocyte count, no lab evidence of hemolysis. Folic acid and J50 levels are normal, no iron deficiency. This is likely multifactorial, related to liver cirrhosis and CKD, but the degree of anemia is out of proportion. Underlying bone marrow disease, especially MDS, needs to be ruled out. I recommend bone marrow biopsy to  be done while he is in hospital. I discussed the procedure, risks and complications with patient and his wife in detail.  The patient was reluctant to have the bone marrow biopsy but after further discussion today, he is now agreeable.  Since he is scheduled for procedure on 12/10/2019, will plan for bone marrow biopsy on Wednesday, 12/11/2019.  I have entered the order for the bone marrow biopsy to be performed by IR. -Given his liver cirrhosis and chronic anticoagulation, it is reasonable to ruled out GI bleeding. EGD and colonoscopy scheduled for 12/10/19.   -I agree with blood transfusion to keep hemoglobin above 8.5-9.0, given his heart disease    LOS: 2 days   Mikey Bussing, DNP, AGPCNP-BC, AOCNP 12/09/19   Addendum  I spoke with pt's wife and plan to have BM biopsy by IR on Wednesdays. Questions were answered.   Truitt Merle  12/09/2019

## 2019-12-09 NOTE — Progress Notes (Addendum)
Progress Note  Patient Name: Evan [name] Date of Encounter: 12/09/2019  [facility] Cardiologist: Evan Sayed [name], MD   Subjective   Feeling fair this morning. Still with SOB which is unchanged from yesterday. No complaints of chest pain, though has had progressive DOE over the past several months.   Inpatient Medications    Scheduled Meds:  sodium chloride   Intravenous Once   amLODipine  2.5 mg Oral Daily   ezetimibe  10 mg Oral Daily   furosemide  40 mg Intravenous Daily   hydrALAZINE  25 mg Oral BID   insulin aspart  0-15 Units Subcutaneous TID WC   pantoprazole  40 mg Oral Daily   polyethylene glycol  17 g Oral BID   tamsulosin  0.4 mg Oral Daily   cyanocobalamin  1,000 mcg Oral Daily   Continuous Infusions:   PRN Meds: acetaminophen **OR** acetaminophen   Vital Signs    Vitals:   12/08/19 2010 12/09/19 0105 12/09/19 0416 12/09/19 0742  BP: 140/69 (!) 130/57 112/61 (!) 113/55  Pulse: 71 64 63 65  Resp: _0 Temp: 98.8 F (37.1 C) 98.5 F (36.9 C) 98.8 F (37.1 C) (!) 97.4 F (36.3 C)  TempSrc: Oral Oral Oral Oral  SpO2: 96% 97% 95% 99%  Weight:  121.5 kg    Height:        Intake/Output Summary (Last 24 hours) at 12/09/2019 0743 Last data filed at 12/09/2019 0417 Gross per 24 hour  Intake 1460 ml  Output 1500 ml  Net -40 ml   Last 3 Weights 12/09/2019 12/08/2019 12/07/2019  Weight (lbs) 267 lb 14.4 oz 270 lb 6.4 oz 269 lb 10 oz  Weight (kg) 121.519 kg 122.653 kg 122.3 kg      Telemetry    V-paced rhythm - Personally Reviewed  ECG    No new tracings - Personally Reviewed  Physical Exam   GEN: chronically ill gentleman sitting upright in bed appearing mildly SOB.   Neck: No JVD Cardiac: RRR, +murmur, no rubs or gallops.  Respiratory: Clear to auscultation bilaterally. GI: Soft, nontender, non-distended  MS: 1+ LE edema; No deformity. Neuro:  Nonfocal  Psych: Normal affect   Labs    High Sensitivity Troponin:  No results  for input(s): TROPONINIHS in the last 720 hours.    Chemistry Recent Labs  Lab 12/06/19 1630 12/06/19 1630 12/07/19 1015 12/07/19 1844 12/08/19 0749 12/09/19 0100  NA 141  --  139  --  139 141  K 4.4   < > 3.9  --  4.1 3.7  CL 98   < > 99  --  99 100  CO2 28   < > 28  --  28 28  GLUCOSE 155*  --  196*  --  147* 116*  BUN 54*  --  57*  --  53* 46*  CREATININE 2.32*   < > 2.48*  --  2.25* 2.09*  CALCIUM 9.0   < > 9.0  --  9.4 9.2  PROT  --   --   --  6.6 6.6 6.0*  ALBUMIN  --   --   --  3.8 3.9 3.6  AST  --   --   --  _1 ALT  --   --   --  _2 ALKPHOS  --   --   --  127* 128* 104  BILITOT  --   --   --  2.3* 3.1* 2.9*  GFRNONAA 26*  --  26*  --  29* 32*  GFRAA 30*  --   --   --   --   --   ANIONGAP  --   --  12  --  12 13   < > = values in this interval not displayed.     Hematology Recent Labs  Lab 12/07/19 2005 12/08/19 0749 12/09/19 0100  WBC 5.6 5.9  5.7 5.8  RBC 2.20* 2.73*  2.69* 2.59*  HGB 7.3* 8.8*  8.8* 8.3*  HCT 24.5* 28.3*  27.8* 26.8*  MCV 111.4* 103.7*  103.3* 103.5*  MCH 33.2 32.2  32.7 32.0  MCHC 29.8* 31.1  31.7 31.0  RDW 21.4* 22.3*  22.5* 21.9*  PLT 245 262  266 257    BNP Recent Labs  Lab 12/06/19 1633  PROBNP 9,423*     DDimer No results for input(s): DDIMER in the last 168 hours.   Radiology    DG Chest 2 View  Result Date: 12/07/2019 CLINICAL DATA:  Shortness of breath. EXAM: CHEST - 2 VIEW COMPARISON:  03/29/2018 FINDINGS: 1016 hours. The cardio pericardial silhouette is enlarged. There is pulmonary vascular congestion without overt pulmonary edema. Diffuse interstitial opacity suggest component of underlying interstitial edema. Left single lead permanent pacemaker noted. Bones are demineralized. IMPRESSION: Enlargement of the cardiopericardial silhouette with pulmonary vascular congestion and probable interstitial edema. Electronically Signed   By: Misty Stanley M.D.   On: 12/07/2019 10:40   ECHOCARDIOGRAM  COMPLETE  Result Date: 12/08/2019    ECHOCARDIOGRAM REPORT   Patient Name:   Evan [name] [facility] Date of Exam: 12/08/2019 Medical Rec #:  [id number]      Height:       72.0 in Accession #:    [id number]     Weight:       270.4 lb Date of Birth:  Apr 09, 1940      BSA:          2.422 m Patient Age:    78 years       BP:           135/63 mmHg Patient Gender: M              HR:           65 bpm. Exam Location:  Inpatient Procedure: 2D Echo, Cardiac Doppler and Color Doppler Indications:    CHF  History:        Patient has prior history of Echocardiogram examinations, most                 recent 08/27/2018. CHF, CAD, Pacemaker and Prior CABG, Stroke,                 Aortic Valve Disease and TAVR, Arrythmias:Atrial Fibrillation;                 Risk Factors:Hypertension, Dyslipidemia and Diabetes.  Sonographer:    Dustin Flock Referring Phys: [place]  1. Left ventricular ejection fraction, by estimation, is 45 to 50%. The left ventricle has mildly decreased function. The left ventricle demonstrates global hypokinesis. There is moderate concentric left ventricular hypertrophy. Left ventricular diastolic function could not be evaluated.  2. Right ventricular systolic function is moderately reduced. The right ventricular size is moderately enlarged. There is severely elevated pulmonary artery systolic pressure. The estimated right ventricular systolic pressure is 89.2 mmHg.  3. Left atrial size was severely dilated.  4. Right atrial  size was moderately dilated.  5. The mitral valve is degenerative. Mild mitral valve regurgitation. Moderate to severe mitral annular calcification.  6. The aortic valve has been repaired/replaced. Aortic valve regurgitation is not visualized. Procedure Date: 04/29/2017. Echo findings are consistent with normal structure and function of the aortic valve prosthesis. Aortic valve mean gradient measures  11.0 mmHg. Aortic valve Vmax measures 2.24 m/s.  7. The inferior vena  cava is dilated in size with <50% respiratory variability, suggesting right atrial pressure of 15 mmHg. Comparison(s): Prior images reviewed side by side. The left ventricular function is worsened. The right ventricular systolic function is worse. Pulmonary artery systolic pressure and right atrial pressure are higher. TAVR gradients are improved (possibly  due to lower cardiac output). FINDINGS  Left Ventricle: Left ventricular ejection fraction, by estimation, is 45 to 50%. The left ventricle has mildly decreased function. The left ventricle demonstrates global hypokinesis. The left ventricular internal cavity size was normal in size. There is  moderate concentric left ventricular hypertrophy. Abnormal (paradoxical) septal motion, consistent with RV pacemaker and abnormal (paradoxical) septal motion consistent with post-operative status. Left ventricular diastolic function could not be evaluated due to atrial fibrillation. Left ventricular diastolic function could not be evaluated. Right Ventricle: The right ventricular size is moderately enlarged. Right vetricular wall thickness was not well visualized. Right ventricular systolic function is moderately reduced. There is severely elevated pulmonary artery systolic pressure. The tricuspid regurgitant velocity is 3.68 m/s, and with an assumed right atrial pressure of 15 mmHg, the estimated right ventricular systolic pressure is 27.0 mmHg. Left Atrium: Left atrial size was severely dilated. Right Atrium: Right atrial size was moderately dilated. Pericardium: There is no evidence of pericardial effusion. Mitral Valve: The mitral valve is degenerative in appearance. Moderate to severe mitral annular calcification. Mild mitral valve regurgitation. Tricuspid Valve: The tricuspid valve is normal in structure. Tricuspid valve regurgitation is trivial. Aortic Valve: The aortic valve has been repaired/replaced. Aortic valve regurgitation is not visualized. Aortic valve mean  gradient measures 11.0 mmHg. Aortic valve peak gradient measures 20.1 mmHg. Aortic valve area, by VTI measures 1.44 cm. 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 Echo findings are consistent with normal structure and function of the aortic valve prosthesis. Pulmonic Valve: The pulmonic valve was grossly normal. Pulmonic valve regurgitation is not visualized. Aorta: The aortic root is normal in size and structure. Venous: The inferior vena cava is dilated in size with less than 50% respiratory variability, suggesting right atrial pressure of 15 mmHg. IAS/Shunts: No atrial level shunt detected by color flow Doppler. Additional Comments: A pacer wire is visualized.  LEFT VENTRICLE PLAX 2D LVIDd:         5.50 cm  Diastology LVIDs:         4.70 cm  LV e' medial:    4.46 cm/s LV PW:         1.50 cm  LV E/e' medial:  31.2 LV IVS:        1.50 cm  LV e' lateral:   5.77 cm/s LVOT diam:     2.00 cm  LV E/e' lateral: 24.1 LV SV:         67 LV SV Index:   28 LVOT Area:     3.14 cm  RIGHT VENTRICLE RV Basal diam:  5.20 cm RV S prime:     4.46 cm/s TAPSE (M-mode): 3.2 cm LEFT ATRIUM  Index       RIGHT ATRIUM           Index LA diam:        5.10 cm 2.11 cm/m  RA Area:     25.40 cm LA Vol (A2C):   93.2 ml 38.48 ml/m RA Volume:   75.50 ml  31.17 ml/m LA Vol (A4C):   89.7 ml 37.04 ml/m LA Biplane Vol: 94.7 ml 39.10 ml/m  AORTIC VALVE AV Area (Vmax):    1.46 cm AV Area (Vmean):   1.28 cm AV Area (VTI):     1.44 cm AV Vmax:           224.00 cm/s AV Vmean:          158.000 cm/s AV VTI:            0.466 m AV Peak Grad:      20.1 mmHg AV Mean Grad:      11.0 mmHg LVOT Vmax:         104.00 cm/s LVOT Vmean:        64.500 cm/s LVOT VTI:          0.213 m LVOT/AV VTI ratio: 0.46  AORTA Ao Root diam: 3.50 cm MITRAL VALVE                TRICUSPID VALVE MV Area (PHT): 4.49 cm     TR Peak grad:   54.2 mmHg MV Decel Time: 169 msec     TR Vmax:        368.00 cm/s MV E velocity: 139.00  cm/s MV A velocity: 30.60 cm/s   SHUNTS MV E/A ratio:  4.54         Systemic VTI:  0.21 m                             Systemic Diam: 2.00 cm Dani Gobble Croitoru MD Electronically signed by Sanda Klein MD Signature Date/Time: 12/08/2019/3:50:51 PM    Final    US Abdomen Limited RUQ (LIVER/GB)  Result Date: 12/07/2019 CLINICAL DATA:  Cirrhosis EXAM: ULTRASOUND ABDOMEN LIMITED RIGHT UPPER QUADRANT COMPARISON:  April 09, 2017 FINDINGS: Gallbladder: No gallstones visualized. Mild diffuse gallbladder wall thickening the relatively decompressed gallbladder up to 5 mm. No sonographic Murphy sign noted by sonographer. Common bile duct: Diameter: 5 mm, within normal limits Liver: Mildly nodular contour of the liver. Liver echogenicity is heterogeneous. Small volume ascites. Portal vein is patent on color Doppler imaging with normal direction of blood flow towards the liver. Other: None. IMPRESSION: 1. 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 No focal hepatic lesion identified sonographically. 2. Mild diffuse gallbladder wall thickening is most likely due to volume status or hypoalbuminemia. Electronically Signed   By: Valentino Saxon MD   On: 12/07/2019 17:02    Cardiac Studies   Left/right heart catheterization 02/2017: Mid RCA lesion is 100% stenosed. Ost LM to Dist LM lesion is 70% stenosed. Prox Cx lesion is 90% stenosed. Ost 2nd Mrg lesion is 100% stenosed. Prox LAD-1 lesion is 90% stenosed. Prox LAD-2 lesion is 100% stenosed. LIMA and is normal in caliber. The graft exhibits no disease. SVG graft was visualized by angiography and is normal in caliber. Left radial artery graft was visualized by angiography.   1.  Severe native three-vessel coronary artery disease with total occlusion of the RCA, severe left main stenosis, total occlusion of the LAD with severe calcification, and  severe stenosis of the circumflex and its branch vessels.   2.  Status post  aortocoronary bypass surgery with continued patency of the LIMA to LAD, continued patency of the free radial graft to OM with tight stenosis at the distal anastomosis involving a small caliber vessel, and continued patency of the saphenous vein graft to PDA without any significant disease.   3.  Severe aortic valve stenosis with a mean gradient of 40 mmHg.  The calculated valve area is 1.74 cm likely related to high cardiac output   4.  Moderate pulmonary hypertension with a PA pressure of 72/25 with a mean of 38     Recommendations: The patient will undergo permanent pacemaker placement for symptomatic atrial fibrillation with slow ventricular rate.  He will continue to undergo evaluation by the multidisciplinary heart team for consideration of treatment options for his severe aortic stenosis.     Echocardiogram 08/2018: 1. The left ventricle has normal systolic function, with an ejection  fraction of 55-60%. The cavity size was normal. There is moderately  increased left ventricular wall thickness. Left ventricular diastolic  function could not be evaluated secondary to  atrial fibrillation. Elevated mean left atrial pressure.   2. The right ventricle has normal systolic function. The cavity was  moderately enlarged. Right ventricular systolic pressure is mildly  elevated with an estimated pressure of 33.2 mmHg.   3. Left atrial size was severely dilated.   4. Right atrial size was severely dilated.   5. The mitral valve is abnormal. There is moderate mitral annular  calcification present.   6. The tricuspid valve is grossly normal.   7. A Edwards Sapien bioprosthetic aortic valve (TAVR) valve is present in  the aortic position. Procedure Date: 04/29/2017 Normal aortic valve  prosthesis.   8. The aorta is normal in size and structure.   9. The inferior vena cava was dilated in size with >50% respiratory  variability.  10. Normal LV systolic function; moderate LVH; biatrial enlargement;   moderate RVE; s/p AVR with mean gradient of 17 mmHg and no AI.   Echocardiogram 12/08/19: 1. Left ventricular ejection fraction, by estimation, is 45 to 50%. The  left ventricle has mildly decreased function. The left ventricle  demonstrates global hypokinesis. There is moderate concentric left  ventricular hypertrophy. Left ventricular  diastolic function could not be evaluated.   2. Right ventricular systolic function is moderately reduced. The right  ventricular size is moderately enlarged. There is severely elevated  pulmonary artery systolic pressure. The estimated right ventricular  systolic pressure is 12.7 mmHg.   3. Left atrial size was severely dilated.   4. Right atrial size was moderately dilated.   5. The mitral valve is degenerative. Mild mitral valve regurgitation.  Moderate to severe mitral annular calcification.   6. The aortic valve has been repaired/replaced. Aortic valve  regurgitation is not visualized. Procedure Date: 04/29/2017. Echo findings  are consistent with normal structure and function of the aortic valve  prosthesis. Aortic valve mean gradient measures   11.0 mmHg. Aortic valve Vmax measures 2.24 m/s.   7. The inferior vena cava is dilated in size with <50% respiratory  variability, suggesting right atrial pressure of 15 mmHg.   Patient Profile     79 y.o. male with PMH of CAD s/p CABG in 2005, carotid artery disease, aortic stenosis s/p TAVR, chronic diastolic CHF, permanent atrial fibrillation, SSS s/p PPM, HTN, HLD, DM type 2, anemia, CKD stage 3, and cirrhosis, who is being  followed by cardiology for acute on chronic diastolic CHF.  Assessment & Plan    1. Acute combined CHF: patient presented wit worsening SOB, improved with IV lasix which was de-escalated yesterday to 25m daily. UOP is essentially net nil over the past 24 hours with net +[id number] admission. Weight is down to 267lbs today from 270lbs yesterday. Echo yesterday showed EF 45-50%  (down from 55-60% 08/2018), with global hypokinesis, moderate concentric LVH, moderately reduced RV function, severely elevated PA pressures (previously mildly elevated), severe LAE, moderate RAE, mild MR, moderate to severe MAC, and stable aortic valve s/p TAVR. He denies recent chest pain, though has experienced progressive DOE - Will review echo results with Dr. NiJohnsie Cancel could consider a NST to r/o ischemic cause   - Low threshold to uptitrate lasix if SOB not improved with improvement in Hgb.  2. Symptomatic anemia: patient presented with SOB, fatigue, and lightheadedness. Found to have Hgb 6.4 on admission, down from baseline 9-10. He was transfused with improvement in Hgb to 8.8, though Hgb back down to 8.3 today.  GI following with plans for possible EGD/C-scope this admission. Oncology also following with plans for possible bone marrow biopsy this admission - Continue transfusions per primary team for goal Hgb >9 - anticipate transfusion today.   3. AoCKD stage 3b: Cr peaked at 2.48 this admission, improved to 2.09 today following diuresis.  - Continue close monitoring  4. CAD s/p CABG: no CP complaints, though has been experiencing progressive DOE over the past couple months. His last ischemic evaluation was 02/2017 in preparation for TAVR which revealed severe native 3-vessel CAD with patent LIMA to LAD, radial graft to OM with tight stenosis at the distal anastomosis in small caliber vessel, and patent SVG to PDA.   5. Permanent atrial fibrillation: rates are stable without AV nodal blocking agents. Anticoagulation on hold given #2.  - Continue telemetry while inpatient - Anticipate restarting eliquis when Hgb stabilizes.   6. Pulmonary artery hypertension: echo this admission with moderate-severely elevated PA pressures, previously mildly elevated 08/2018.  - Continue diuresis as above.   7. HTN: BP stable. Home HCTZ and benazepril on hold given #3.  - Continue amlodipine and hydralazine  - uptitrate as needed   8. Aortic stenosis s/p TAVR: valve appears stable on echo this admission - Continue routine outpatient monitoring           For questions or updates, please contact [facility] consult [email or web address] for contact info under        Signed, KrAbigail ButtsPA-C  12/09/2019, 7:43 AM    Patient examined chart reviewed Discussed care with PA and wife who does most of the talking. Cardiac status stable. SEM through TAVR valve no AR Telemetry with pacing. Not a candidate for cath with anemia requiring transfusion and renal failure. [facility] to proceed with [facility].  No indication to risk stratify since he cannot have cath at this time  Will sign off continue diuretics will arrange outpatient f/u with Dr JoMartinique Tramel Westbrook MD [facility]

## 2019-12-10 ENCOUNTER — Inpatient Hospital Stay (HOSPITAL_COMMUNITY): Payer: Medicare Other | Admitting: Anesthesiology

## 2019-12-10 ENCOUNTER — Encounter (HOSPITAL_COMMUNITY)
Admission: EM | Disposition: A | Discharge status: Home Health | Payer: Self-pay | Source: Home / Self Care | Attending: Internal Medicine

## 2019-12-10 ENCOUNTER — Encounter (HOSPITAL_COMMUNITY): Payer: Self-pay | Admitting: Internal Medicine

## 2019-12-10 DIAGNOSIS — I5042 Chronic combined systolic (congestive) and diastolic (congestive) heart failure: Secondary | ICD-10-CM

## 2019-12-10 DIAGNOSIS — N183 Chronic kidney disease, stage 3 unspecified: Secondary | ICD-10-CM | POA: Diagnosis present

## 2019-12-10 DIAGNOSIS — I5033 Acute on chronic diastolic (congestive) heart failure: Secondary | ICD-10-CM | POA: Diagnosis present

## 2019-12-10 DIAGNOSIS — D649 Anemia, unspecified: Secondary | ICD-10-CM | POA: Diagnosis not present

## 2019-12-10 DIAGNOSIS — N179 Acute kidney failure, unspecified: Secondary | ICD-10-CM | POA: Diagnosis present

## 2019-12-10 HISTORY — PX: COLONOSCOPY: SHX5424

## 2019-12-10 HISTORY — PX: POLYPECTOMY: SHX5525

## 2019-12-10 HISTORY — PX: HEMOSTASIS CLIP PLACEMENT: SHX6857

## 2019-12-10 HISTORY — PX: ESOPHAGOGASTRODUODENOSCOPY: SHX5428

## 2019-12-10 LAB — TYPE AND SCREEN
ABO/RH(D): A POS
Antibody Screen: NEGATIVE
Unit division: 0
Unit division: 0
Unit division: 0
Unit division: 0

## 2019-12-10 LAB — GLUCOSE, CAPILLARY
Glucose-Capillary: 124 mg/dL — ABNORMAL HIGH (ref 70–99)
Glucose-Capillary: 129 mg/dL — ABNORMAL HIGH (ref 70–99)
Glucose-Capillary: 130 mg/dL — ABNORMAL HIGH (ref 70–99)
Glucose-Capillary: 141 mg/dL — ABNORMAL HIGH (ref 70–99)
Glucose-Capillary: 159 mg/dL — ABNORMAL HIGH (ref 70–99)

## 2019-12-10 LAB — BPAM RBC
Blood Product Expiration Date: 202111282359
Blood Product Expiration Date: 202112112359
Blood Product Expiration Date: 202112112359
Blood Product Expiration Date: 202112112359
ISSUE DATE / TIME: 202111131133
ISSUE DATE / TIME: 202111132355
ISSUE DATE / TIME: 202111140247
ISSUE DATE / TIME: 202111151036
Unit Type and Rh: 6200
Unit Type and Rh: 6200
Unit Type and Rh: 6200
Unit Type and Rh: 6200

## 2019-12-10 LAB — COMPREHENSIVE METABOLIC PANEL
ALT: 16 U/L (ref 0–44)
AST: 18 U/L (ref 15–41)
Albumin: 3.6 g/dL (ref 3.5–5.0)
Alkaline Phosphatase: 113 U/L (ref 38–126)
Anion gap: 13 (ref 5–15)
BUN: 42 mg/dL — ABNORMAL HIGH (ref 8–23)
CO2: 29 mmol/L (ref 22–32)
Calcium: 9.2 mg/dL (ref 8.9–10.3)
Chloride: 99 mmol/L (ref 98–111)
Creatinine, Ser: 2 mg/dL — ABNORMAL HIGH (ref 0.61–1.24)
GFR, Estimated: 34 mL/min — ABNORMAL LOW (ref >60)
Glucose, Bld: 133 mg/dL — ABNORMAL HIGH (ref 70–99)
Potassium: 3.8 mmol/L (ref 3.5–5.1)
Sodium: 141 mmol/L (ref 135–145)
Total Bilirubin: 3.1 mg/dL — ABNORMAL HIGH (ref 0.3–1.2)
Total Protein: 6.3 g/dL — ABNORMAL LOW (ref 6.5–8.1)

## 2019-12-10 LAB — CBC
HCT: 29 % — ABNORMAL LOW (ref 39.0–52.0)
Hemoglobin: 9.4 g/dL — ABNORMAL LOW (ref 13.0–17.0)
MCH: 33 pg (ref 26.0–34.0)
MCHC: 32.4 g/dL (ref 30.0–36.0)
MCV: 101.8 fL — ABNORMAL HIGH (ref 80.0–100.0)
Platelets: 250 10*3/uL (ref 150–400)
RBC: 2.85 MIL/uL — ABNORMAL LOW (ref 4.22–5.81)
RDW: 21.6 % — ABNORMAL HIGH (ref 11.5–15.5)
WBC: 5.3 10*3/uL (ref 4.0–10.5)
nRBC: 0 % (ref 0.0–0.2)

## 2019-12-10 SURGERY — EGD (ESOPHAGOGASTRODUODENOSCOPY)
Anesthesia: Monitor Anesthesia Care

## 2019-12-10 MED ORDER — PANTOPRAZOLE SODIUM 40 MG PO TBEC
40.0000 mg | DELAYED_RELEASE_TABLET | Freq: Every day | ORAL | Status: DC
  Administered 2019-12-11 – 2019-12-12 (×2): 40 mg via ORAL
  Filled 2019-12-10 (×2): qty 1

## 2019-12-10 MED ORDER — PROPOFOL 500 MG/50ML IV EMUL
INTRAVENOUS | Status: DC | PRN
  Administered 2019-12-10: 75 ug/kg/min via INTRAVENOUS

## 2019-12-10 MED ORDER — LIDOCAINE HCL (CARDIAC) PF 100 MG/5ML IV SOSY
PREFILLED_SYRINGE | INTRAVENOUS | Status: DC | PRN
  Administered 2019-12-10: 40 mg via INTRAVENOUS

## 2019-12-10 MED ORDER — PROPOFOL 10 MG/ML IV BOLUS
INTRAVENOUS | Status: DC | PRN
  Administered 2019-12-10 (×4): 10 mg via INTRAVENOUS

## 2019-12-10 MED ORDER — NYSTATIN 100000 UNIT/ML MT SUSP
5.0000 mL | Freq: Four times a day (QID) | OROMUCOSAL | Status: DC
  Administered 2019-12-10 – 2019-12-12 (×9): 500000 [IU] via OROMUCOSAL
  Filled 2019-12-10 (×9): qty 5

## 2019-12-10 MED ORDER — LACTATED RINGERS IV SOLN: INTRAVENOUS | Status: DC | PRN

## 2019-12-10 MED ORDER — EPHEDRINE SULFATE 50 MG/ML IJ SOLN
INTRAMUSCULAR | Status: DC | PRN
  Administered 2019-12-10: 7.5 mg via INTRAVENOUS
  Administered 2019-12-10: 5 mg via INTRAVENOUS

## 2019-12-10 MED ORDER — PHENYLEPHRINE HCL (PRESSORS) 10 MG/ML IV SOLN
INTRAVENOUS | Status: DC | PRN
  Administered 2019-12-10: 40 ug via INTRAVENOUS
  Administered 2019-12-10: 80 ug via INTRAVENOUS
  Administered 2019-12-10: 40 ug via INTRAVENOUS
  Administered 2019-12-10 (×3): 80 ug via INTRAVENOUS

## 2019-12-10 NOTE — Plan of Care (Signed)
  Problem: Education: Goal: Knowledge of General Education information will improve Description Including pain rating scale, medication(s)/side effects and non-pharmacologic comfort measures Outcome: Progressing   

## 2019-12-10 NOTE — Progress Notes (Addendum)
Subjective:   [name] is a 79 year old male with past medical history significant for chronic macrocytic anemia, diastolic CHF (EF of 11-02% on 08/2018), persistent atrial fibrillation (s/p pacemaker, on Eliquis), cirrhosis, HTN, HLD, CAD s/p CABG 2005, carotid artery disease, aortic stenosis s/p TAVR, prior CVA (right PCA), T2DM, and chronic use of 2L O2 via [place] who presented to the [facility] ED on 11/13 due to shortness of breath found to have progression of his chronic macrocytic anemia, acute on chronic CHF exacerbation as well as acute on chronic kidney disease.   Overnight, no acute events.  This morning, patient reports that he is tired after the colonoscopy/endoscopy, but that he feels well. He states that his shortness of breath is improved, but his legs are more swollen because he slept most the night sitting upright. He reports having a strong appetite and looks forward to eating lunch and having his coffee. His wife reports that he continued to have confusion overnight. The patient and family report being ready for the bone marrow biopsy tomorrow morning.  Objective:  Vital signs in last 24 hours: Vitals:   12/10/19 1005 12/10/19 1015 12/10/19 1025 12/10/19 1111  BP: (!) 133/58 134/60 134/60 125/60  Pulse: 64 64 64 69  Resp: 19 (!) '23 20 18  ' Temp: 97.7 F (36.5 C)   (!) 97.5 F (36.4 C)  TempSrc: Axillary   Oral  SpO2: 100% 92% 92% 100%  Weight:      Height:      SpO2: 100 % O2 Flow Rate (L/min): 2 L/min  Intake/Output Summary (Last 24 hours) at 12/10/2019 1233 Last data filed at 12/10/2019 0957 Gross per 24 hour  Intake 1678 ml  Output 100 ml  Net 1578 ml   Filed Weights   12/08/19 0500 12/09/19 0105 12/10/19 0100  Weight: 122.7 kg 121.5 kg 122.2 kg  Physical Exam Vitals and nursing note reviewed.  Constitutional:      General: He is not in acute distress.    Appearance: He is obese.  HENT:     Head: Normocephalic and atraumatic.  Cardiovascular:       Rate and Rhythm: Normal rate and regular rhythm.  Pulmonary:     Effort: Pulmonary effort is normal. No respiratory distress.     Breath sounds: Normal breath sounds.  Abdominal:     General: Bowel sounds are normal.     Palpations: Abdomen is soft.     Tenderness: There is no abdominal tenderness.  Musculoskeletal:        General: Normal range of motion.     Cervical back: Normal range of motion and neck supple.     Right lower leg: Edema present.     Left lower leg: Edema present.     Comments: 2+ pitting edema of bilateral lower extremities extending to the knees  Skin:    General: Skin is warm and dry.     Capillary Refill: Capillary refill takes less than 2 seconds.  Neurological:     General: No focal deficit present.     Mental Status: He is alert.  Psychiatric:        Mood and Affect: Mood normal.        Behavior: Behavior normal.    CBC Latest Ref Rng & Units 12/10/2019 12/09/2019 12/09/2019  WBC 4.0 - 10.5 K/uL 5.3 - 5.8  Hemoglobin 13.0 - 17.0 g/dL 9.4(L) 9.8(L) 8.3(L)  Hematocrit 39 - 52 % 29.0(L) 30.8(L) 26.8(L)  Platelets 150 -  400 K/uL 250 - 257   CMP Latest Ref Rng & Units 12/10/2019 12/09/2019 12/08/2019  Glucose 70 - 99 mg/dL 133(H) 116(H) 147(H)  BUN 8 - 23 mg/dL 42(H) 46(H) 53(H)  Creatinine 0.61 - 1.24 mg/dL 2.00(H) 2.09(H) 2.25(H)  Sodium 135 - 145 mmol/L 141 141 139  Potassium 3.5 - 5.1 mmol/L 3.8 3.7 4.1  Chloride 98 - 111 mmol/L 99 100 99  CO2 22 - 32 mmol/L '29 28 28  ' Calcium 8.9 - 10.3 mg/dL 9.2 9.2 9.4  Total Protein 6.5 - 8.1 g/dL 6.3(L) 6.0(L) 6.6  Total Bilirubin 0.3 - 1.2 mg/dL 3.1(H) 2.9(H) 3.1(H)  Alkaline Phos 38 - 126 U/L 113 104 128(H)  AST 15 - 41 U/L '18 17 16  ' ALT 0 - 44 U/L '16 15 18   ' Haptoglobin - 172 AFP tumor marker - 1.4 Hemoglobin A1c - 6.6 LDH - 190 Direct bilirubin - 0.7 Indirect bilirubin 1.6 Folate - 11.8 Ferritin - 71 Iron - 251 TIBC - 398 Saturation ratios - 63% UIBC - 147 Vitamin B12 -  1,433 Technologist smear - Ovalocytes, teardrop cells, schistocytes Pathologist smear - Macrocytic anemia Reticulocytes: -Retic Ct Pct 2.3 -RBC 1.89 -Retic count, absolute 42.5 -Immature Retic Fract 22.2  DG Chest 2 View  Result Date: 12/07/2019 CLINICAL DATA:  Shortness of breath. EXAM: CHEST - 2 VIEW COMPARISON:  03/29/2018 FINDINGS: 1016 hours. The cardio pericardial silhouette is enlarged. There is pulmonary vascular congestion without overt pulmonary edema. Diffuse interstitial opacity suggest component of underlying interstitial edema. Left single lead permanent pacemaker noted. Bones are demineralized. IMPRESSION: Enlargement of the cardiopericardial silhouette with pulmonary vascular congestion and probable interstitial edema. Electronically Signed   By: Misty Stanley M.D.   On: 12/07/2019 10:40   ECHOCARDIOGRAM COMPLETE  Result Date: 12/08/2019    ECHOCARDIOGRAM REPORT   Patient Name:   Evan [name] [facility] Date of Exam: 12/08/2019 Medical Rec #:  [id number]      Height:       72.0 in Accession #:    [id number]     Weight:       270.4 lb Date of Birth:  Jan 23, 1941      BSA:          2.422 m Patient Age:    41 years       BP:           135/63 mmHg Patient Gender: M              HR:           65 bpm. Exam Location:  Inpatient Procedure: 2D Echo, Cardiac Doppler and Color Doppler Indications:    CHF  History:        Patient has prior history of Echocardiogram examinations, most                 recent 08/27/2018. CHF, CAD, Pacemaker and Prior CABG, Stroke,                 Aortic Valve Disease and TAVR, Arrythmias:Atrial Fibrillation;                 Risk Factors:Hypertension, Dyslipidemia and Diabetes.  Sonographer:    Dustin Flock Referring Phys: [place]  1. Left ventricular ejection fraction, by estimation, is 45 to 50%. The left ventricle has mildly decreased function. The left ventricle demonstrates global hypokinesis. There is moderate concentric left ventricular  hypertrophy. Left ventricular diastolic function could not be evaluated.  2. Right  ventricular systolic function is moderately reduced. The right ventricular size is moderately enlarged. There is severely elevated pulmonary artery systolic pressure. The estimated right ventricular systolic pressure is 98.3 mmHg.  3. Left atrial size was severely dilated.  4. Right atrial size was moderately dilated.  5. The mitral valve is degenerative. Mild mitral valve regurgitation. Moderate to severe mitral annular calcification.  6. The aortic valve has been repaired/replaced. Aortic valve regurgitation is not visualized. Procedure Date: 04/29/2017. Echo findings are consistent with normal structure and function of the aortic valve prosthesis. Aortic valve mean gradient measures  11.0 mmHg. Aortic valve Vmax measures 2.24 m/s.  7. The inferior vena cava is dilated in size with <50% respiratory variability, suggesting right atrial pressure of 15 mmHg. Comparison(s): Prior images reviewed side by side. The left ventricular function is worsened. The right ventricular systolic function is worse. Pulmonary artery systolic pressure and right atrial pressure are higher. TAVR gradients are improved (possibly  due to lower cardiac output). FINDINGS  Left Ventricle: Left ventricular ejection fraction, by estimation, is 45 to 50%. The left ventricle has mildly decreased function. The left ventricle demonstrates global hypokinesis. The left ventricular internal cavity size was normal in size. There is  moderate concentric left ventricular hypertrophy. Abnormal (paradoxical) septal motion, consistent with RV pacemaker and abnormal (paradoxical) septal motion consistent with post-operative status. Left ventricular diastolic function could not be evaluated due to atrial fibrillation. Left ventricular diastolic function could not be evaluated. Right Ventricle: The right ventricular size is moderately enlarged. Right vetricular wall thickness  was not well visualized. Right ventricular systolic function is moderately reduced. There is severely elevated pulmonary artery systolic pressure. The tricuspid regurgitant velocity is 3.68 m/s, and with an assumed right atrial pressure of 15 mmHg, the estimated right ventricular systolic pressure is 38.2 mmHg. Left Atrium: Left atrial size was severely dilated. Right Atrium: Right atrial size was moderately dilated. Pericardium: There is no evidence of pericardial effusion. Mitral Valve: The mitral valve is degenerative in appearance. Moderate to severe mitral annular calcification. Mild mitral valve regurgitation. Tricuspid Valve: The tricuspid valve is normal in structure. Tricuspid valve regurgitation is trivial. Aortic Valve: The aortic valve has been repaired/replaced. Aortic valve regurgitation is not visualized. Aortic valve mean gradient measures 11.0 mmHg. Aortic valve peak gradient measures 20.1 mmHg. Aortic valve area, by VTI measures 1.44 cm. 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 Echo findings are consistent with normal structure and function of the aortic valve prosthesis. Pulmonic Valve: The pulmonic valve was grossly normal. Pulmonic valve regurgitation is not visualized. Aorta: The aortic root is normal in size and structure. Venous: The inferior vena cava is dilated in size with less than 50% respiratory variability, suggesting right atrial pressure of 15 mmHg. IAS/Shunts: No atrial level shunt detected by color flow Doppler. Additional Comments: A pacer wire is visualized.  LEFT VENTRICLE PLAX 2D LVIDd:         5.50 cm  Diastology LVIDs:         4.70 cm  LV e' medial:    4.46 cm/s LV PW:         1.50 cm  LV E/e' medial:  31.2 LV IVS:        1.50 cm  LV e' lateral:   5.77 cm/s LVOT diam:     2.00 cm  LV E/e' lateral: 24.1 LV SV:         67 LV SV Index:   28  LVOT Area:     3.14 cm  RIGHT VENTRICLE RV Basal diam:  5.20 cm RV S prime:     4.46 cm/s TAPSE  (M-mode): 3.2 cm LEFT ATRIUM             Index       RIGHT ATRIUM           Index LA diam:        5.10 cm 2.11 cm/m  RA Area:     25.40 cm LA Vol (A2C):   93.2 ml 38.48 ml/m RA Volume:   75.50 ml  31.17 ml/m LA Vol (A4C):   89.7 ml 37.04 ml/m LA Biplane Vol: 94.7 ml 39.10 ml/m  AORTIC VALVE AV Area (Vmax):    1.46 cm AV Area (Vmean):   1.28 cm AV Area (VTI):     1.44 cm AV Vmax:           224.00 cm/s AV Vmean:          158.000 cm/s AV VTI:            0.466 m AV Peak Grad:      20.1 mmHg AV Mean Grad:      11.0 mmHg LVOT Vmax:         104.00 cm/s LVOT Vmean:        64.500 cm/s LVOT VTI:          0.213 m LVOT/AV VTI ratio: 0.46  AORTA Ao Root diam: 3.50 cm MITRAL VALVE                TRICUSPID VALVE MV Area (PHT): 4.49 cm     TR Peak grad:   54.2 mmHg MV Decel Time: 169 msec     TR Vmax:        368.00 cm/s MV E velocity: 139.00 cm/s MV A velocity: 30.60 cm/s   SHUNTS MV E/A ratio:  4.54         Systemic VTI:  0.21 m                             Systemic Diam: 2.00 cm Dani Gobble Croitoru MD Electronically signed by Sanda Klein MD Signature Date/Time: 12/08/2019/3:50:51 PM    Final    US Abdomen Limited RUQ (LIVER/GB)  Result Date: 12/07/2019 CLINICAL DATA:  Cirrhosis EXAM: ULTRASOUND ABDOMEN LIMITED RIGHT UPPER QUADRANT COMPARISON:  April 09, 2017 FINDINGS: Gallbladder: No gallstones visualized. Mild diffuse gallbladder wall thickening the relatively decompressed gallbladder up to 5 mm. No sonographic Murphy sign noted by sonographer. Common bile duct: Diameter: 5 mm, within normal limits Liver: Mildly nodular contour of the liver. Liver echogenicity is heterogeneous. Small volume ascites. Portal vein is patent on color Doppler imaging with normal direction of blood flow towards the liver. Other: None. IMPRESSION: 1. 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 No focal hepatic lesion identified sonographically. 2. Mild diffuse gallbladder wall thickening is most likely due  to volume status or hypoalbuminemia. Electronically Signed   By: Valentino Saxon MD   On: 12/07/2019 17:02   Assessment/Plan:  Active Problems:   Symptomatic anemia   Cirrhosis [facility])  [name] is a 79 year old male with past medical history significant for chronic macrocytic anemia, diastolic CHF (last EF of 93-57% on 08/2018), persistent atrial fibrillation (s/p pacemaker, on Eliquis), cirrhosis, HTN, HLD, CAD s/p CABG 2005, carotid artery disease, aortic stenosis s/p TAVR, prior CVA (right PCA), T2DM,  and chronic use of 2L O2 via [place] who presented to the [facility] ED on 11/13 due to shortness of breath found to have progression of his chronic macrocytic anemia, acute on chronic CHF exacerbation as well as acute on chronic kidney disease.    #Symptomatic macrocytic anemia, active Patient presented with progressive shortness of breath, fatigue, and lightheadedness. Significant prior laboratory workup with Dr. Rogue Bussing for macrocytic anemia in 03/2019 with recommendation to undergo bone marrow biopsy and colonoscopy/endoscopy, however patient declined. Hemoglobin 6.4 on admission, down from prior of 9.5 in March. Etiology of his anemia is unclear. Patient does have schistocytes, ovalocytes and teardrop cells on his peripheral smear. Remaining laboratory workup unrevealing for a deficiency or hemolytic cause. He responded well to blood transfusions (s/p 4 units) with hemoglobin most recently of 9.4. GI performed endoscopy/colonoscopy revealing diverticulosis, multiple polyps, as well as internal hemorrhoids. Although patient's endoscopic findings may represent possible occult GI blood loss, these findings would not explain patient's macrocytosis and findings from his smear. Hematology/oncology to perform bone marrow biopsy tomorrow to evaluate for possible underlying myelodysplastic syndrome. -Daily CBC, transfuse if Hb <9 -CT guided bone marrow biopsy on 12/11/19 pending IR  scheduling -Continue holding home Eliquis (restart 12/15/19) -Continue home vitamin B12 supplementation, although B12 level elevated, defer to hematology/oncology  #Acute on chronic diastolic heart failure exacerbation, active Patient with history of diastolic CHF (EF 32-99% 02/4266). He has been following closely with cardiology for management of this condition and was previously on 59m lasix daily with escalation of his lasix dose to 430mtwice daily two days prior to arrival. His Pro-BNP was significantly elevated to 9,423 with radiologic evidence of pulmonary vascular congestion and interstitial edema. He received 4097mV lasix twice daily for the first 36 hours of his hospital course with appropriate response with transition to 64m23m lasix once daily per cardiology recommendations. Repeat echocardiogram revealing LVEF of 45-50% with global hypokinesis and severely elevated pulmonary artery systolic pressure. -Continue IV lasix 64mg21mly -Strict intake and output -Daily weights -Continue home supplemental oxygen of 2L -Cardiology signed off with plan to follow-up with Dr. 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 Creatinine improving, most recently 2.00 following blood transfusions, continued diuresis and holding of home ACE-inhibitor and thiazide diuretic. -Daily BMP, monitor creatinine -Avoid nephrotoxins -Holding home benazepril -Holding home HCTZ   #Atrial fibrillation s/p pacemaker, chronic Patient with history of persistent atrial fibrillation s/p pacemaker placement in normal sinus rhythm on admission. Patient's last dose of Eliquis was evening prior to admission. -Holding home Eliquis 5mg t15me daily (restart 12/15/19) -Telemetry   #HTN, chronic Patient's home blood pressure medication regimen includes amlodipine 2.5mg da53m, benazepril  64mg da60m hydralazine 25mg twi54maily, and HCTZ 25mg dail106mContinue amlodipine 2.5mg daily 82mlding home benazepril 64mg daily 39mtinue hydralazine 25mg twice d39m -Holding home HCTZ 25mg daily   44mrhosis, chronic Patient noted to have shrunken appearance and nodular contour of the liver suggestive of cirrhosis on CTA Abdomen Pelvis in March 2019. Patient has no history of alcoholism and hepatitis B and C laboratory workup was unremarkable. RUQ ultrasound consistent with prior. -follow-up with GI in 3-4 weeks  #Candida esophagitis, active -Nystatin 500,000 units 4 times daily for five days   #T2DM, chronic Hemoglobin A1c of 6.6 on admission. Blood sugars well-controlled (124-143 in past 24 hours). -Moderate SSI -CBG  monitoring 4 times daily, before meals and at bedtime  #HLD, chronic -Continue home zetia 78m daily   #Code status: Full code #VTE ppx: SCDs, holding home Eliquis #Diet: Full liquid, advance as tolerated (NPO at midnight) #IVF: 20cc/hr #Bowel regimen: None   JCato Mulligan MD 12/10/2019, 12:33 PM Pager: [phone] 5pm on weekdays and 1pm on weekends: On Call pager [phone]

## 2019-12-10 NOTE — Interval H&P Note (Signed)
History and Physical Interval Note:  12/10/2019 8:47 AM  Evan [name]  has presented today for surgery, with the diagnosis of anemia.  The various methods of treatment have been discussed with the patient and family. After consideration of risks, benefits and other options for treatment, the patient has consented to  Procedure(s): ESOPHAGOGASTRODUODENOSCOPY (EGD) (N/A) COLONOSCOPY (N/A) as a surgical intervention.  The patient's history has been reviewed, patient examined, no change in status, stable for surgery.  I have reviewed the patient's chart and labs.  Questions were answered to the patient's satisfaction.     Lear Ng

## 2019-12-10 NOTE — Plan of Care (Signed)

## 2019-12-10 NOTE — Op Note (Signed)
[facility] Patient Name: Evan [name] Procedure Date : 12/10/2019 MRN: [id number] Attending MD: Lear Ng , MD Date of Birth: 03/27/1940 CSN: [id number] Age: 79 Admit Type: Inpatient Procedure:                Colonoscopy Indications:              This is the patient's first colonoscopy, Iron                            deficiency anemia Providers:                Lear Ng, MD, Kary Kos RN, RN, Cletis Athens, Technician, [place], Technician,                            Virgilio Belling. Huel Cote, CRNA Referring MD:             hospital team Medicines:                Propofol per Anesthesia, Monitored Anesthesia Care Complications:            No immediate complications. Estimated Blood Loss:     Estimated blood loss: none. Procedure:                Pre-Anesthesia Assessment:                           - Prior to the procedure, a History and Physical                            was performed, and patient medications and                            allergies were reviewed. The patient's tolerance of                            previous anesthesia was also reviewed. The risks                            and benefits of the procedure and the sedation                            options and risks were discussed with the patient.                            All questions were answered, and informed consent                            was obtained. Prior Anticoagulants: The patient has                            taken Eliquis (apixaban), last dose was stopped at  admission. ASA Grade Assessment: IV - A patient                            with severe systemic disease that is a constant                            threat to life. After reviewing the risks and                            benefits, the patient was deemed in satisfactory                            condition to undergo the procedure.                            After obtaining informed consent, the colonoscope                            was passed under direct vision. Throughout the                            procedure, the patient's blood pressure, pulse, and                            oxygen saturations were monitored continuously. The                            PCF-H190DL ([id number] Olympus pediatric colonoscope                            was introduced through the anus and advanced to the                            the cecum, identified by appendiceal orifice and                            ileocecal valve. The colonoscopy was performed with                            difficulty due to significant looping and a                            tortuous colon. Successful completion of the                            procedure was aided by straightening and shortening                            the scope to obtain bowel loop reduction, using                            scope torsion and applying abdominal pressure. The  patient tolerated the procedure well. The quality                            of the bowel preparation was fair and fair but                            repeated irrigation led to a good and adequate                            prep. The ileocecal valve, appendiceal orifice, and                            rectum were photographed. Scope In: 9:06:25 AM Scope Out: 9:56:43 AM Scope Withdrawal Time: 0 hours 27 minutes 49 seconds  Total Procedure Duration: 0 hours 50 minutes 18 seconds  Findings:      The perianal and digital rectal examinations were normal.      Three sessile and semi-sessile polyps were found in the descending       colon. The polyps were 5 to 14 mm in size. These polyps were removed       with a hot snare. Resection and retrieval were complete. Estimated blood       loss: none.      A 7 mm polyp was found in the sigmoid colon. The polyp was sessile. The       polyp was removed with a hot snare.  Resection and retrieval were       complete. Estimated blood loss: none.      A 30 mm polyp was found in the cecum. The polyp was semi-pedunculated.       The polyp was removed with a hot snare. Resection and retrieval were       complete. Estimated blood loss: none. To prevent bleeding after the       polypectomy, two hemostatic clips were successfully placed. There was no       bleeding during, or at the end, of the procedure.      Two sessile polyps were found in the transverse colon. The polyps were 7       to 10 mm in size. These polyps were removed with a hot snare. Resection       and retrieval were complete. Estimated blood loss: none.      Multiple small and large-mouthed diverticula were found in the sigmoid       colon.      Internal hemorrhoids were found during retroflexion. The hemorrhoids       were medium-sized and Grade I (internal hemorrhoids that do not       prolapse). Impression:               - Preparation of the colon was fair.                           - Three 5 to 14 mm polyps in the descending colon,                            removed with a hot snare. Resected and retrieved.                           -  One 7 mm polyp in the sigmoid colon, removed with                            a hot snare. Resected and retrieved.                           - One 30 mm polyp in the cecum, removed with a hot                            snare. Resected and retrieved. Clips were placed.                           - Two 7 to 10 mm polyps in the transverse colon,                            removed with a hot snare. Resected and retrieved.                           - Diverticulosis in the sigmoid colon.                           - Internal hemorrhoids. Recommendation:           - Patient has a contact number available for                            emergencies. The signs and symptoms of potential                            delayed complications were discussed with the                             patient. Return to normal activities tomorrow.                            Written discharge instructions were provided to the                            patient.                           - High fiber diet.                           - Await pathology results.                           - Repeat colonoscopy for surveillance based on                            pathology results.                           - Continue to hold Eliquis until further notice. Procedure Code(s):        --- Professional ---  45385, Colonoscopy, flexible; with removal of                            tumor(s), polyp(s), or other lesion(s) by snare                            technique Diagnosis Code(s):        --- Professional ---                           D50.9, Iron deficiency anemia, unspecified                           K63.5, Polyp of colon                           K64.0, First degree hemorrhoids                           K57.30, Diverticulosis of large intestine without                            perforation or abscess without bleeding CPT copyright 2019 American Medical Association. All rights reserved. The codes documented in this report are preliminary and upon coder review may  be revised to meet current compliance requirements. Lear Ng, MD 12/10/2019 10:18:11 AM This report has been signed electronically. Number of Addenda: 0

## 2019-12-10 NOTE — Transfer of Care (Signed)
Immediate Anesthesia Transfer of Care Note  Patient: Evan [name]  Procedure(s) Performed: ESOPHAGOGASTRODUODENOSCOPY (EGD) (N/A ) COLONOSCOPY (N/A ) POLYPECTOMY FOREIGN BODY RETRIEVAL (N/A ) HEMOSTASIS CLIP PLACEMENT  Patient Location: Endoscopy Unit  Anesthesia Type:MAC  Level of Consciousness: awake, alert  and oriented  Airway & Oxygen Therapy: Patient Spontanous Breathing and Patient connected to nasal cannula oxygen  Post-op Assessment: Report given to RN and Post -op Vital signs reviewed and stable  Post vital signs: Reviewed and stable  Last Vitals:  Vitals Value Taken Time  BP 133/58 12/10/19 1005  Temp    Pulse 64 12/10/19 1005  Resp 20 12/10/19 1005  SpO2 100 % 12/10/19 1005  Vitals shown include unvalidated device data.  Last Pain:  Vitals:   12/10/19 1005  TempSrc: (P) Axillary  PainSc:          Complications: No complications documented.

## 2019-12-10 NOTE — Brief Op Note (Signed)
Mild Candida esophagitis. Seven colon polyps removed. Sigmoid diverticulosis. Internal hemorrhoids on colonoscopy. See endopro note for details. No further GI procedures planned. Hold Eliquis for another 5 days if possible. Advance diet. F/U in office in 3-4 weeks. Will sign off. Call if questions.

## 2019-12-10 NOTE — Anesthesia Postprocedure Evaluation (Signed)
Anesthesia Post Note  Patient: Evan [name]  Procedure(s) Performed: ESOPHAGOGASTRODUODENOSCOPY (EGD) (N/A ) COLONOSCOPY (N/A ) POLYPECTOMY FOREIGN BODY RETRIEVAL (N/A ) HEMOSTASIS CLIP PLACEMENT     Patient location during evaluation: PACU Anesthesia Type: MAC Level of consciousness: awake and alert Pain management: pain level controlled Vital Signs Assessment: post-procedure vital signs reviewed and stable Respiratory status: spontaneous breathing Cardiovascular status: stable Anesthetic complications: no   No complications documented.  Last Vitals:  Vitals:   12/10/19 1025 12/10/19 1111  BP: 134/60 125/60  Pulse: 64 69  Resp: 20 18  Temp:  (!) 36.4 C  SpO2: 92% 100%    Last Pain:  Vitals:   12/10/19 1111  TempSrc: Oral  PainSc:                  Nolon Nations

## 2019-12-10 NOTE — Plan of Care (Signed)
  Problem: Education: Goal: Knowledge of General Education information will improve Description: Including pain rating scale, medication(s)/side effects and non-pharmacologic comfort measures 12/10/2019 2021 by Nelia Shi, RN Outcome: Progressing 12/10/2019 2021 by Nelia Shi, RN Outcome: Progressing

## 2019-12-10 NOTE — Op Note (Signed)
[facility] Patient Name: Evan [name] Procedure Date : 12/10/2019 MRN: [id number] Attending MD: Lear Ng , MD Date of Birth: 13-Dec-1940 CSN: [id number] Age: 79 Admit Type: Inpatient Procedure:                Upper GI endoscopy Indications:              Iron deficiency anemia Providers:                Lear Ng, MD, Kary Kos RN, RN, Cletis Athens, Technician, [place], Technician,                            Virgilio Belling. Huel Cote, CRNA Referring MD:             hospital team Medicines:                Propofol per Anesthesia, Monitored Anesthesia Care Complications:            No immediate complications. Estimated Blood Loss:      Procedure:                Pre-Anesthesia Assessment:                           - Prior to the procedure, a History and Physical                            was performed, and patient medications and                            allergies were reviewed. The patient's tolerance of                            previous anesthesia was also reviewed. The risks                            and benefits of the procedure and the sedation                            options and risks were discussed with the patient.                            All questions were answered, and informed consent                            was obtained. Prior Anticoagulants: The patient has                            taken no previous anticoagulant or antiplatelet                            agents. ASA Grade Assessment: IV - A patient with  severe systemic disease that is a constant threat                            to life. After reviewing the risks and benefits,                            the patient was deemed in satisfactory condition to                            undergo the procedure.                           After obtaining informed consent, the endoscope was                            passed  under direct vision. Throughout the                            procedure, the patient's blood pressure, pulse, and                            oxygen saturations were monitored continuously. The                            GIF-H190 ([id number]) Olympus gastroscope was                            introduced through the mouth, and advanced to the                            second part of duodenum. The upper GI endoscopy was                            accomplished without difficulty. The patient                            tolerated the procedure well. Scope In: Scope Out: Findings:      Localized, white plaques were found in the mid esophagus.      The exam of the esophagus was otherwise normal.      The Z-line was found 46 cm from the incisors.      The entire examined stomach was normal.      The cardia and gastric fundus were normal on retroflexion.      The examined duodenum was normal. Impression:               - Esophageal plaques were found, consistent with                            candidiasis.                           - Z-line, 46 cm from the incisors.                           - Normal  stomach.                           - Normal examined duodenum.                           - No specimens collected. Recommendation:           - Advance diet as tolerated and clear liquid diet.                           - Nystatin suspension 100,000 units PO QID for 5                            days. Procedure Code(s):        --- Professional ---                           [phone], Esophagogastroduodenoscopy, flexible,                            transoral; diagnostic, including collection of                            specimen(s) by brushing or washing, when performed                            (separate procedure) Diagnosis Code(s):        --- Professional ---                           D50.9, Iron deficiency anemia, unspecified                           K22.9, Disease of esophagus, unspecified CPT  copyright 2019 American Medical Association. All rights reserved. The codes documented in this report are preliminary and upon coder review may  be revised to meet current compliance requirements. Lear Ng, MD 12/10/2019 10:06:49 AM This report has been signed electronically. Number of Addenda: 0

## 2019-12-11 ENCOUNTER — Other Ambulatory Visit: Payer: Self-pay

## 2019-12-11 ENCOUNTER — Inpatient Hospital Stay (HOSPITAL_COMMUNITY): Payer: Medicare Other

## 2019-12-11 ENCOUNTER — Telehealth: Payer: Self-pay | Admitting: Cardiology

## 2019-12-11 DIAGNOSIS — D649 Anemia, unspecified: Secondary | ICD-10-CM | POA: Diagnosis not present

## 2019-12-11 DIAGNOSIS — N179 Acute kidney failure, unspecified: Secondary | ICD-10-CM | POA: Diagnosis not present

## 2019-12-11 DIAGNOSIS — I5042 Chronic combined systolic (congestive) and diastolic (congestive) heart failure: Secondary | ICD-10-CM | POA: Diagnosis not present

## 2019-12-11 LAB — HEMOGLOBIN AND HEMATOCRIT, BLOOD
HCT: 32.1 % — ABNORMAL LOW (ref 39.0–52.0)
Hemoglobin: 10.1 g/dL — ABNORMAL LOW (ref 13.0–17.0)

## 2019-12-11 LAB — CBC WITH DIFFERENTIAL/PLATELET
Abs Immature Granulocytes: 0.01 10*3/uL (ref 0.00–0.07)
Basophils Absolute: 0 10*3/uL (ref 0.0–0.1)
Basophils Relative: 1 %
Eosinophils Absolute: 0.4 10*3/uL (ref 0.0–0.5)
Eosinophils Relative: 7 %
HCT: 27.2 % — ABNORMAL LOW (ref 39.0–52.0)
Hemoglobin: 8.4 g/dL — ABNORMAL LOW (ref 13.0–17.0)
Immature Granulocytes: 0 %
Lymphocytes Relative: 14 %
Lymphs Abs: 0.8 10*3/uL (ref 0.7–4.0)
MCH: 31.7 pg (ref 26.0–34.0)
MCHC: 30.9 g/dL (ref 30.0–36.0)
MCV: 102.6 fL — ABNORMAL HIGH (ref 80.0–100.0)
Monocytes Absolute: 0.3 10*3/uL (ref 0.1–1.0)
Monocytes Relative: 6 %
Neutro Abs: 4 10*3/uL (ref 1.7–7.7)
Neutrophils Relative %: 72 %
Platelets: 229 10*3/uL (ref 150–400)
RBC: 2.65 MIL/uL — ABNORMAL LOW (ref 4.22–5.81)
RDW: 21.2 % — ABNORMAL HIGH (ref 11.5–15.5)
WBC: 5.6 10*3/uL (ref 4.0–10.5)
nRBC: 0 % (ref 0.0–0.2)

## 2019-12-11 LAB — COMPREHENSIVE METABOLIC PANEL
ALT: 17 U/L (ref 0–44)
AST: 23 U/L (ref 15–41)
Albumin: 3.3 g/dL — ABNORMAL LOW (ref 3.5–5.0)
Alkaline Phosphatase: 103 U/L (ref 38–126)
Anion gap: 11 (ref 5–15)
BUN: 42 mg/dL — ABNORMAL HIGH (ref 8–23)
CO2: 28 mmol/L (ref 22–32)
Calcium: 8.6 mg/dL — ABNORMAL LOW (ref 8.9–10.3)
Chloride: 99 mmol/L (ref 98–111)
Creatinine, Ser: 2.19 mg/dL — ABNORMAL HIGH (ref 0.61–1.24)
GFR, Estimated: 30 mL/min — ABNORMAL LOW (ref >60)
Glucose, Bld: 147 mg/dL — ABNORMAL HIGH (ref 70–99)
Potassium: 3.9 mmol/L (ref 3.5–5.1)
Sodium: 138 mmol/L (ref 135–145)
Total Bilirubin: 2.2 mg/dL — ABNORMAL HIGH (ref 0.3–1.2)
Total Protein: 5.8 g/dL — ABNORMAL LOW (ref 6.5–8.1)

## 2019-12-11 LAB — PREPARE RBC (CROSSMATCH)

## 2019-12-11 LAB — SURGICAL PATHOLOGY

## 2019-12-11 LAB — GLUCOSE, CAPILLARY
Glucose-Capillary: 113 mg/dL — ABNORMAL HIGH (ref 70–99)
Glucose-Capillary: 155 mg/dL — ABNORMAL HIGH (ref 70–99)
Glucose-Capillary: 132 mg/dL — ABNORMAL HIGH (ref 70–99)
Glucose-Capillary: 139 mg/dL — ABNORMAL HIGH (ref 70–99)

## 2019-12-11 LAB — HIV ANTIBODY (ROUTINE TESTING W REFLEX): HIV Screen 4th Generation wRfx: NONREACTIVE

## 2019-12-11 MED ORDER — SODIUM CHLORIDE 0.9 % IV SOLN
INTRAVENOUS | Status: AC | PRN
  Administered 2019-12-11: 10 mL/h via INTRAVENOUS

## 2019-12-11 MED ORDER — FENTANYL CITRATE (PF) 100 MCG/2ML IJ SOLN
INTRAMUSCULAR | Status: AC
  Filled 2019-12-11: qty 2

## 2019-12-11 MED ORDER — HYDROCODONE-ACETAMINOPHEN 5-325 MG PO TABS
1.0000 | ORAL_TABLET | ORAL | Status: DC | PRN
  Administered 2019-12-11: 1 via ORAL
  Filled 2019-12-11: qty 1

## 2019-12-11 MED ORDER — MIDAZOLAM HCL 2 MG/2ML IJ SOLN
INTRAMUSCULAR | Status: AC
  Filled 2019-12-11: qty 2

## 2019-12-11 MED ORDER — LIDOCAINE HCL 1 % IJ SOLN
INTRAMUSCULAR | Status: AC
  Filled 2019-12-11: qty 20

## 2019-12-11 MED ORDER — MIDAZOLAM HCL 2 MG/2ML IJ SOLN
INTRAMUSCULAR | Status: AC | PRN
  Administered 2019-12-11: 1 mg via INTRAVENOUS
  Administered 2019-12-11: 0.5 mg via INTRAVENOUS

## 2019-12-11 MED ORDER — SODIUM CHLORIDE 0.9% IV SOLUTION: Freq: Once | INTRAVENOUS | Status: AC

## 2019-12-11 MED ORDER — FENTANYL CITRATE (PF) 100 MCG/2ML IJ SOLN
INTRAMUSCULAR | Status: AC | PRN
Start: 2019-12-11 — End: 2019-12-11
  Administered 2019-12-11: 25 ug via INTRAVENOUS
  Administered 2019-12-11: 50 ug via INTRAVENOUS

## 2019-12-11 NOTE — Procedures (Signed)
  Procedure: CT guided bone marrow biopsy r iliac EBL:   minimal Complications:  none immediate  See full dictation in [employer].  Dillard Cannon MD Main # [phone] Pager  [phone] Mobile [phone]

## 2019-12-11 NOTE — Telephone Encounter (Signed)
Spoke to patient's wife she stated husband was admitted to [facility] this past Saturday.Stated hgb was very low.He has had 4 units of blood and he might get another unit of blood today.Stated she would like to speak to [facility] PA about husband's condition.Darlyn Chamber is in clinic today,but I will send message to him.

## 2019-12-11 NOTE — Progress Notes (Addendum)
Subjective:   [name] is a 79 year old male with past medical history significant for chronic macrocytic anemia, diastolic CHF (EF of 92-92% on 08/2018), persistent atrial fibrillation (s/p pacemaker, on Eliquis), cirrhosis, HTN, HLD, CAD s/p CABG 2005, carotid artery disease, aortic stenosis s/p TAVR, prior CVA (right PCA), T2DM, and chronic use of 2L O2 via [place] who presented to the [facility] ED on 11/13 due to shortness of breath found to have progression of his chronic macrocytic anemia, acute on chronic CHF exacerbation as well as acute on chronic kidney disease.   Overnight, no acute events.  This morning, patient was seen at bedside following bone marrow biopsy. He reports not having any pain during or after the procedure. His main concern is that he is hungry and would like to eat something for breakfast. He states that his breathing feels better, but that he would like to sit more upright. He otherwise has no additional concerns or questions this morning.   Objective:  Vital signs in last 24 hours: Vitals:   12/11/19 0915 12/11/19 0920 12/11/19 0925 12/11/19 1015  BP: (!) 145/69 130/70 119/67 110/86  Pulse: 69 73 72 78  Resp: _0 Temp:      TempSrc:      SpO2: 98% 97% 97%   Weight:      Height:      SpO2: 97 % O2 Flow Rate (L/min): 2 L/min  Intake/Output Summary (Last 24 hours) at 12/11/2019 1034 Last data filed at 12/11/2019 0600 Gross per 24 hour  Intake 660 ml  Output 950 ml  Net -290 ml   Filed Weights   12/09/19 0105 12/10/19 0100 12/11/19 0510  Weight: 121.5 kg 122.2 kg 123.2 kg  Physical Exam Vitals and nursing note reviewed.  Constitutional:      General: He is not in acute distress.    Appearance: He is obese.  HENT:     Head: Normocephalic and atraumatic.  Cardiovascular:     Rate and Rhythm: Normal rate and regular rhythm.  Pulmonary:     Effort: Pulmonary effort is normal. No respiratory distress.     Breath sounds: Normal breath  sounds.  Abdominal:     General: Bowel sounds are normal.     Palpations: Abdomen is soft.     Tenderness: There is no abdominal tenderness.  Musculoskeletal:        General: Normal range of motion.     Cervical back: Normal range of motion and neck supple.     Right lower leg: Edema present.     Left lower leg: Edema present.     Comments: 2+ pitting edema of bilateral lower extremities extending to the knees  Skin:    General: Skin is warm and dry.     Capillary Refill: Capillary refill takes less than 2 seconds.  Neurological:     General: No focal deficit present.     Mental Status: He is alert.  Psychiatric:        Mood and Affect: Mood normal.        Behavior: Behavior normal.    CBC Latest Ref Rng & Units 12/11/2019 12/10/2019 12/09/2019  WBC 4.0 - 10.5 K/uL 5.6 5.3 -  Hemoglobin 13.0 - 17.0 g/dL 8.4(L) 9.4(L) 9.8(L)  Hematocrit 39 - 52 % 27.2(L) 29.0(L) 30.8(L)  Platelets 150 - 400 K/uL 229 250 -   CMP Latest Ref Rng & Units 12/11/2019 12/10/2019 12/09/2019  Glucose 70 - 99 mg/dL 147(H)  133(H) 116(H)  BUN 8 - 23 mg/dL 42(H) 42(H) 46(H)  Creatinine 0.61 - 1.24 mg/dL 2.19(H) 2.00(H) 2.09(H)  Sodium 135 - 145 mmol/L 138 141 141  Potassium 3.5 - 5.1 mmol/L 3.9 3.8 3.7  Chloride 98 - 111 mmol/L 99 99 100  CO2 22 - 32 mmol/L _0 Calcium 8.9 - 10.3 mg/dL 8.6(L) 9.2 9.2  Total Protein 6.5 - 8.1 g/dL 5.8(L) 6.3(L) 6.0(L)  Total Bilirubin 0.3 - 1.2 mg/dL 2.2(H) 3.1(H) 2.9(H)  Alkaline Phos 38 - 126 U/L 103 113 104  AST 15 - 41 U/L _1 ALT 0 - 44 U/L _2 Assessment/Plan:  Principal Problem:   Symptomatic anemia Active Problems:   Diabetes mellitus (HCC)   Atrial fibrillation, permanent (HCC)   S/P TAVR (transcatheter aortic valve replacement)   Cirrhosis (HCC)   Acute on chronic heart failure with preserved ejection fraction (HFpEF) (HCC)   Acute renal failure superimposed on stage 3 chronic kidney disease ([place])  [name] is a  79 year old male with past medical history significant for chronic macrocytic anemia, diastolic CHF (last EF of 45-36% on 08/2018), persistent atrial fibrillation (s/p pacemaker, on Eliquis), cirrhosis, HTN, HLD, CAD s/p CABG 2005, carotid artery disease, aortic stenosis s/p TAVR, prior CVA (right PCA), T2DM, and chronic use of 2L O2 via [place] who presented to the [facility] ED on 11/13 due to shortness of breath found to have progression of his chronic macrocytic anemia, acute on chronic CHF exacerbation as well as acute on chronic kidney disease.    #Symptomatic macrocytic anemia, active Patient presented with progressive shortness of breath, fatigue, and lightheadedness. Significant prior laboratory workup with Dr. Rogue Bussing for macrocytic anemia in 03/2019 with recommendation to undergo bone marrow biopsy and colonoscopy/endoscopy, however patient declined. Hemoglobin 6.4 on admission, down from prior of 9.5 in March. Patient does have schistocytes, ovalocytes and teardrop cells on his peripheral smear. Remaining labs unrevealing for a deficiency or hemolytic cause. GI performed endoscopy/colonoscopy revealing diverticulosis, multiple polyps, as well as internal hemorrhoids. Although patient's endoscopic findings may represent possible occult GI blood loss, these findings would not explain patient's macrocytosis and findings from his smear. Hemoglobin this morning 8.4 (below goal of 9). Patient underwent bone marrow biopsy without complication. Hematology/oncology following and providing recommendations. -Appreciate hematology/oncology recommendations -Transfuse 1 unit as hemoglobin <9 -Continue holding home Eliquis (restart 12/15/19) -Continue home vitamin B12 supplementation, although B12 level elevated, defer to hematology/oncology -Bedrest for 1 hour following biopsy then advance as tolerated -Norco/Vicodin 1-2 tablet Q4H PRN  #Acute on chronic diastolic heart failure exacerbation, active Patient  with history of diastolic CHF (EF 46-80% 03/2120). He has been following closely with cardiology for management of this condition and was previously on 63m lasix daily with escalation of his lasix dose to 488mtwice daily two days prior to arrival. His Pro-BNP was significantly elevated to 9,423 with radiologic evidence of pulmonary vascular congestion and interstitial edema. He received 402mV lasix twice daily for the first 36 hours of his hospital course with appropriate response with transition to 60m66m lasix once daily per cardiology recommendations. Repeat echocardiogram revealing LVEF of 45-50% with global hypokinesis and severely elevated pulmonary artery systolic pressure. Patient continues to appear volume overloaded on examination, however he reports that his breathing is at his baseline. -Continue IV lasix 60mg6mly -Strict intake and output -Daily weights -Continue home supplemental oxygen of 2L -Cardiology signed off with plan to follow-up with  Dr. 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 Creatinine improving, most recently 2.19 following blood transfusions, continued diuresis and holding of home ACE-inhibitor and thiazide diuretic. -Daily BMP, monitor creatinine -Avoid nephrotoxins -Holding home benazepril -Holding home HCTZ   #Atrial fibrillation s/p pacemaker, chronic Patient with history of persistent atrial fibrillation s/p pacemaker placement in normal sinus rhythm on admission. Patient's last dose of Eliquis was evening prior to admission. -Holding home Eliquis 75m twice daily (restart 12/15/19) -Telemetry   #HTN, chronic Patient's home blood pressure medication regimen includes amlodipine 2.537mdaily, benazepril 4030maily, hydralazine 70m44mice daily, and HCTZ 70mg70mly. -Continue amlodipine 2.5mg d22my -Holding home  benazepril 40mg d74m -Continue hydralazine 70mg tw29mdaily -Holding home HCTZ 70mg dai99m #Cirrhosis, chronic Patient noted to have shrunken appearance and nodular contour of the liver suggestive of cirrhosis on CTA Abdomen Pelvis in March 2019. Patient has no history of alcoholism and hepatitis B and C laboratory workup was unremarkable. RUQ ultrasound consistent with prior. -follow-up with GI in 3-4 weeks  #Candida esophagitis, active -Nystatin 500,000 units 4 times daily for five days (day 2/5)   #T2DM, chronic Hemoglobin A1c of 6.6 on admission. Blood sugars well-controlled (124-143 in past 24 hours). -Moderate SSI -CBG monitoring 4 times daily, before meals and at bedtime  #HLD, chronic -Continue home zetia 10mg dail21m#Code status: Full code #VTE ppx: SCDs, holding home Eliquis #Diet: Carb modified #IVF: 20cc/hr #Bowel regimen: None   Edan Juday, MCato Mulligan/2021, 10:34 AM Pager: [phone] on weekdays and 1pm on weekends: On Call pager [phone]

## 2019-12-11 NOTE — Telephone Encounter (Signed)
I have called the patient's wife and gave her an update of her husband's hospital work-up and conditions.  I will call her again tomorrow to give her another update.

## 2019-12-11 NOTE — Telephone Encounter (Signed)
New message:    Patient wife calling stating that her husband is in the hospital and she is very worried, because they have given 4 units of blood and one today. Patient wife would like for the nurse to call her before they give more blood.

## 2019-12-11 NOTE — Progress Notes (Signed)
Patient has an order for one unit of the blood. Patient went this morning for biopsy, waiting on lab draw for type and screen in order to start blood. Phlebotomy notified that patient is back on the unit.

## 2019-12-12 ENCOUNTER — Telehealth: Payer: Self-pay | Admitting: Internal Medicine

## 2019-12-12 ENCOUNTER — Other Ambulatory Visit: Payer: Self-pay | Admitting: Student

## 2019-12-12 DIAGNOSIS — I5042 Chronic combined systolic (congestive) and diastolic (congestive) heart failure: Secondary | ICD-10-CM | POA: Diagnosis not present

## 2019-12-12 DIAGNOSIS — N179 Acute kidney failure, unspecified: Secondary | ICD-10-CM | POA: Diagnosis not present

## 2019-12-12 DIAGNOSIS — D649 Anemia, unspecified: Secondary | ICD-10-CM | POA: Diagnosis not present

## 2019-12-12 DIAGNOSIS — D469 Myelodysplastic syndrome, unspecified: Secondary | ICD-10-CM

## 2019-12-12 LAB — COMPREHENSIVE METABOLIC PANEL
ALT: 17 U/L (ref 0–44)
AST: 19 U/L (ref 15–41)
Albumin: 3.4 g/dL — ABNORMAL LOW (ref 3.5–5.0)
Alkaline Phosphatase: 110 U/L (ref 38–126)
Anion gap: 9 (ref 5–15)
BUN: 43 mg/dL — ABNORMAL HIGH (ref 8–23)
CO2: 30 mmol/L (ref 22–32)
Calcium: 8.6 mg/dL — ABNORMAL LOW (ref 8.9–10.3)
Chloride: 100 mmol/L (ref 98–111)
Creatinine, Ser: 2.14 mg/dL — ABNORMAL HIGH (ref 0.61–1.24)
GFR, Estimated: 31 mL/min — ABNORMAL LOW (ref >60)
Glucose, Bld: 131 mg/dL — ABNORMAL HIGH (ref 70–99)
Potassium: 4.1 mmol/L (ref 3.5–5.1)
Sodium: 139 mmol/L (ref 135–145)
Total Bilirubin: 2.1 mg/dL — ABNORMAL HIGH (ref 0.3–1.2)
Total Protein: 5.9 g/dL — ABNORMAL LOW (ref 6.5–8.1)

## 2019-12-12 LAB — CBC
HCT: 28.6 % — ABNORMAL LOW (ref 39.0–52.0)
Hemoglobin: 9.1 g/dL — ABNORMAL LOW (ref 13.0–17.0)
MCH: 32.4 pg (ref 26.0–34.0)
MCHC: 31.8 g/dL (ref 30.0–36.0)
MCV: 101.8 fL — ABNORMAL HIGH (ref 80.0–100.0)
Platelets: 212 10*3/uL (ref 150–400)
RBC: 2.81 MIL/uL — ABNORMAL LOW (ref 4.22–5.81)
RDW: 21.2 % — ABNORMAL HIGH (ref 11.5–15.5)
WBC: 4.8 10*3/uL (ref 4.0–10.5)
nRBC: 0 % (ref 0.0–0.2)

## 2019-12-12 LAB — GLUCOSE, CAPILLARY
Glucose-Capillary: 112 mg/dL — ABNORMAL HIGH (ref 70–99)
Glucose-Capillary: 171 mg/dL — ABNORMAL HIGH (ref 70–99)
Glucose-Capillary: 177 mg/dL — ABNORMAL HIGH (ref 70–99)

## 2019-12-12 LAB — TYPE AND SCREEN
ABO/RH(D): A POS
Antibody Screen: NEGATIVE
Unit division: 0

## 2019-12-12 LAB — BPAM RBC
Blood Product Expiration Date: 202112132359
ISSUE DATE / TIME: 202111171247
Unit Type and Rh: 6200

## 2019-12-12 MED ORDER — NYSTATIN 100000 UNIT/ML MT SUSP: 5.0000 mL | Freq: Four times a day (QID) | OROMUCOSAL | 0 refills | Status: AC

## 2019-12-12 MED ORDER — FUROSEMIDE 10 MG/ML IJ SOLN: 40.0000 mg | Freq: Two times a day (BID) | INTRAMUSCULAR | Status: DC

## 2019-12-12 MED ORDER — PANTOPRAZOLE SODIUM 40 MG PO TBEC
40.0000 mg | DELAYED_RELEASE_TABLET | Freq: Every day | ORAL | 0 refills | Status: DC
Start: 2019-12-13 — End: 2020-01-14

## 2019-12-12 NOTE — Discharge Instructions (Signed)
Evan [name],  It was a pleasure meeting you during your recent hospitalization. 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 Dr. Rogue Bussing will follow up with you on November 22nd for further evaluation and management of this condition next week. Please make sure to attend this follow-up appointment. Regarding your chronic heart failure, please follow-up with Dr. [name] with Cardiology on November 23rd. In the meantime, we would like for you to resume your home diuretic regimen of 40mg  lasix daily. Please, also discuss with your cardiologist the need to restart your home Eliquis, HCTZ and benazepril which we held during your hospitalization. Finally, please follow-up with gastroenterology in three to four weeks for continued follow-up of your endoscopy/colonoscopy findings.  Sincerely, Dr. Paulla Dolly, MD

## 2019-12-12 NOTE — Progress Notes (Addendum)
HEMATOLOGY-ONCOLOGY PROGRESS NOTE  SUBJECTIVE: Had bone marrow biopsy performed yesterday.  Tolerated the procedure well overall.  The patient's wife is at the bedside and states that he received pain medication last evening which made him somewhat restless.  He denies complaints today.  REVIEW OF SYSTEMS:   Constitutional: Denies fevers, chills, positive for fatigue Eyes: Denies blurriness of vision Ears, nose, mouth, throat, and face: Denies mucositis or sore throat Respiratory: No shortness of breath but reports dyspnea on exertion Cardiovascular: Denies palpitation, chest discomfort Gastrointestinal:  Denies nausea, heartburn or change in bowel habits Skin: Denies abnormal skin rashes Lymphatics: Denies new lymphadenopathy or easy bruising Neurological:Denies numbness, tingling or new weaknesses Behavioral/Psych: Mood is stable, no new changes  Extremities: Reports lower extremity edema All other systems were reviewed with the patient and are negative.  I have reviewed the past medical history, past surgical history, social history and family history with the patient and they are unchanged from previous note.   PHYSICAL EXAMINATION: ECOG PERFORMANCE STATUS: 3 - Symptomatic, >50% confined to bed  Vitals:   12/12/19 1000 12/12/19 1140  BP: (!) 116/53 100/67  Pulse: 67 65  Resp: 18 19  Temp: (!) 97.4 F (36.3 C) 97.9 F (36.6 C)  SpO2: 96% 97%   Filed Weights   12/10/19 0100 12/11/19 0510 12/12/19 0435  Weight: 122.2 kg 123.2 kg 124.6 kg    Intake/Output from previous day: 11/17 0701 - 11/18 0700 In: 480 [P.O.:480] Out: 950 [Urine:950]  GENERAL:alert, no distress and comfortable SKIN: skin color, texture, turgor are normal, no rashes or significant lesions EYES: normal, Conjunctiva are pink and non-injected, sclera clear OROPHARYNX:no exudate, no erythema and lips, buccal mucosa, and tongue normal  LUNGS: clear to auscultation and percussion with normal breathing  effort HEART: regular rate & rhythm and no murmurs and trace lower extremity edema ABDOMEN:abdomen soft, non-tender and normal bowel sounds Musculoskeletal:no cyanosis of digits and no clubbing  NEURO: alert & oriented x 3 with fluent speech, no focal motor/sensory deficits  LABORATORY DATA:  I have reviewed the data as listed CMP Latest Ref Rng & Units 12/12/2019 12/11/2019 12/10/2019  Glucose 70 - 99 mg/dL 131(H) 147(H) 133(H)  BUN 8 - 23 mg/dL 43(H) 42(H) 42(H)  Creatinine 0.61 - 1.24 mg/dL 2.14(H) 2.19(H) 2.00(H)  Sodium 135 - 145 mmol/L 139 138 141  Potassium 3.5 - 5.1 mmol/L 4.1 3.9 3.8  Chloride 98 - 111 mmol/L 100 99 99  CO2 22 - 32 mmol/L '30 28 29  ' Calcium 8.9 - 10.3 mg/dL 8.6(L) 8.6(L) 9.2  Total Protein 6.5 - 8.1 g/dL 5.9(L) 5.8(L) 6.3(L)  Total Bilirubin 0.3 - 1.2 mg/dL 2.1(H) 2.2(H) 3.1(H)  Alkaline Phos 38 - 126 U/L 110 103 113  AST 15 - 41 U/L '19 23 18  ' ALT 0 - 44 U/L '17 17 16    ' Lab Results  Component Value Date   WBC 4.8 12/12/2019   HGB 9.1 (L) 12/12/2019   HCT 28.6 (L) 12/12/2019   MCV 101.8 (H) 12/12/2019   PLT 212 12/12/2019   NEUTROABS 4.0 12/11/2019    DG Chest 2 View  Result Date: 12/07/2019 CLINICAL DATA:  Shortness of breath. EXAM: CHEST - 2 VIEW COMPARISON:  03/29/2018 FINDINGS: 1016 hours. The cardio pericardial silhouette is enlarged. There is pulmonary vascular congestion without overt pulmonary edema. Diffuse interstitial opacity suggest component of underlying interstitial edema. Left single lead permanent pacemaker noted. Bones are demineralized. IMPRESSION: Enlargement of the cardiopericardial silhouette with pulmonary vascular congestion and probable  interstitial edema. Electronically Signed   By: Misty Stanley M.D.   On: 12/07/2019 10:40   CT BONE MARROW BIOPSY & ASPIRATION  Result Date: 12/11/2019 CLINICAL DATA:  Anemia EXAM: CT GUIDED DEEP ILIAC BONE ASPIRATION AND CORE BIOPSY TECHNIQUE: Patient was placed prone on the CT gantry and  limited axial scans through the pelvis were obtained. Appropriate skin entry site was identified. Skin site was marked, prepped with chlorhexidine, draped in usual sterile fashion, and infiltrated locally with 1% lidocaine. Intravenous Fentanyl 13mg and Versed 155mwere administered as conscious sedation during continuous monitoring of the patient's level of consciousness and physiological / cardiorespiratory status by the radiology RN, with a total moderate sedation time of 20 minutes. Under CT fluoroscopic guidance an 11-gauge Cook trocar bone needle was advanced into the right iliac bone just lateral to the sacroiliac joint. Once needle tip position was confirmed, core and aspiration samples were obtained, submitted to pathology for approval. Post procedure scans show no hematoma or fracture. Patient tolerated procedure well. COMPLICATIONS: COMPLICATIONS none IMPRESSION: 1. Technically successful CT guided right iliac bone core and aspiration biopsy. Electronically Signed   By: D Lucrezia Europe.D.   On: 12/11/2019 09:52   ECHOCARDIOGRAM COMPLETE  Result Date: 12/08/2019    ECHOCARDIOGRAM REPORT   [facility] Medical Rec #:  [id number]    Height:       72.0 in Accession #:    [id number]   Weight:       270.4 lb Date of Birth:  5/April 01, 1940    BSA:          2.422 m Patient Age:    [age]       BP:           135/63 mmHg Patient Gender: M              HR:           65 bpm. Exam Location:  Inpatient Procedure: 2D Echo, Cardiac Doppler and Color Doppler Indications:    CHF  History:        Patient has prior history of Echocardiogram examinations, most                 recent 08/27/2018. CHF, CAD, Pacemaker and Prior CABG, Stroke,                 Aortic Valve Disease and TAVR, Arrythmias:Atrial Fibrillation;                 Risk Factors:Hypertension, Dyslipidemia and Diabetes.  Sonographer:    BrDustin Flockeferring Phys: [id number]. Left  ventricular ejection fraction, by estimation, is 45 to 50%. The left ventricle has mildly decreased function. The left ventricle demonstrates global hypokinesis. There is moderate concentric left ventricular hypertrophy. Left ventricular diastolic function could not be evaluated.  2. Right ventricular systolic function is moderately reduced. The right ventricular size is moderately enlarged. There is severely elevated pulmonary artery systolic pressure. The estimated right ventricular systolic pressure is 6946.9mHg.  3. Left atrial size was severely dilated.  4. Right atrial size was moderately dilated.  5. The mitral valve is degenerative. Mild mitral valve regurgitation. Moderate to severe mitral annular calcification.  6. The aortic valve has been repaired/replaced. Aortic valve regurgitation is not visualized. Procedure Date: 04/29/2017. Echo findings are consistent with normal structure and function of the aortic valve  prosthesis. Aortic valve mean gradient measures  11.0 mmHg. Aortic valve Vmax measures 2.24 m/s.  7. The inferior vena cava is dilated in size with <50% respiratory variability, suggesting right atrial pressure of 15 mmHg. Comparison(s): Prior images reviewed side by side. The left ventricular function is worsened. The right ventricular systolic function is worse. Pulmonary artery systolic pressure and right atrial pressure are higher. TAVR gradients are improved (possibly  due to lower cardiac output). FINDINGS  Left Ventricle: Left ventricular ejection fraction, by estimation, is 45 to 50%. The left ventricle has mildly decreased function. The left ventricle demonstrates global hypokinesis. The left ventricular internal cavity size was normal in size. There is  moderate concentric left ventricular hypertrophy. Abnormal (paradoxical) septal motion, consistent with RV pacemaker and abnormal (paradoxical) septal motion consistent with post-operative status. Left ventricular diastolic function  could not be evaluated due to atrial fibrillation. Left ventricular diastolic function could not be evaluated. Right Ventricle: The right ventricular size is moderately enlarged. Right vetricular wall thickness was not well visualized. Right ventricular systolic function is moderately reduced. There is severely elevated pulmonary artery systolic pressure. The tricuspid regurgitant velocity is 3.68 m/s, and with an assumed right atrial pressure of 15 mmHg, the estimated right ventricular systolic pressure is 55.3 mmHg. Left Atrium: Left atrial size was severely dilated. Right Atrium: Right atrial size was moderately dilated. Pericardium: There is no evidence of pericardial effusion. Mitral Valve: The mitral valve is degenerative in appearance. Moderate to severe mitral annular calcification. Mild mitral valve regurgitation. Tricuspid Valve: The tricuspid valve is normal in structure. Tricuspid valve regurgitation is trivial. Aortic Valve: The aortic valve has been repaired/replaced. Aortic valve regurgitation is not visualized. Aortic valve mean gradient measures 11.0 mmHg. Aortic valve peak gradient measures 20.1 mmHg. Aortic valve area, by VTI measures 1.44 cm. 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 Echo findings are consistent with normal structure and function of the aortic valve prosthesis. Pulmonic Valve: The pulmonic valve was grossly normal. Pulmonic valve regurgitation is not visualized. Aorta: The aortic root is normal in size and structure. Venous: The inferior vena cava is dilated in size with less than 50% respiratory variability, suggesting right atrial pressure of 15 mmHg. IAS/Shunts: No atrial level shunt detected by color flow Doppler. Additional Comments: A pacer wire is visualized.  LEFT VENTRICLE PLAX 2D LVIDd:         5.50 cm  Diastology LVIDs:         4.70 cm  LV e' medial:    4.46 cm/s LV PW:         1.50 cm  LV E/e' medial:  31.2 LV IVS:         1.50 cm  LV e' lateral:   5.77 cm/s LVOT diam:     2.00 cm  LV E/e' lateral: 24.1 LV SV:         67 LV SV Index:   28 LVOT Area:     3.14 cm  RIGHT VENTRICLE RV Basal diam:  5.20 cm RV S prime:     4.46 cm/s TAPSE (M-mode): 3.2 cm LEFT ATRIUM             Index       RIGHT ATRIUM           Index LA diam:        5.10 cm 2.11 cm/m  RA Area:     25.40 cm LA Vol (A2C):   93.2  ml 38.48 ml/m RA Volume:   75.50 ml  31.17 ml/m LA Vol (A4C):   89.7 ml 37.04 ml/m LA Biplane Vol: 94.7 ml 39.10 ml/m  AORTIC VALVE AV Area (Vmax):    1.46 cm AV Area (Vmean):   1.28 cm AV Area (VTI):     1.44 cm AV Vmax:           224.00 cm/s AV Vmean:          158.000 cm/s AV VTI:            0.466 m AV Peak Grad:      20.1 mmHg AV Mean Grad:      11.0 mmHg LVOT Vmax:         104.00 cm/s LVOT Vmean:        64.500 cm/s LVOT VTI:          0.213 m LVOT/AV VTI ratio: 0.46  AORTA Ao Root diam: 3.50 cm MITRAL VALVE                TRICUSPID VALVE MV Area (PHT): 4.49 cm     TR Peak grad:   54.2 mmHg MV Decel Time: 169 msec     TR Vmax:        368.00 cm/s MV E velocity: 139.00 cm/s MV A velocity: 30.60 cm/s   SHUNTS MV E/A ratio:  4.54         Systemic VTI:  0.21 m                             Systemic Diam: 2.00 cm Dani Gobble Croitoru MD Electronically signed by Sanda Klein MD Signature Date/Time: 12/08/2019/3:50:51 PM    Final    US Abdomen Limited RUQ (LIVER/GB)  Result Date: 12/07/2019 CLINICAL DATA:  Cirrhosis EXAM: ULTRASOUND ABDOMEN LIMITED RIGHT UPPER QUADRANT COMPARISON:  April 09, 2017 FINDINGS: Gallbladder: No gallstones visualized. Mild diffuse gallbladder wall thickening the relatively decompressed gallbladder up to 5 mm. No sonographic Murphy sign noted by sonographer. Common bile duct: Diameter: 5 mm, within normal limits Liver: Mildly nodular contour of the liver. Liver echogenicity is heterogeneous. Small volume ascites. Portal vein is patent on color Doppler imaging with normal direction of blood flow towards the liver. Other:  None. IMPRESSION: 1. 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 No focal hepatic lesion identified sonographically. 2. Mild diffuse gallbladder wall thickening is most likely due to volume status or hypoalbuminemia. Electronically Signed   By: Valentino Saxon MD   On: 12/07/2019 17:02    ASSESSMENT AND PLAN: 79 year old Caucasian male with extensive heart disease, on Eliquis for atrial fibrillation, liver cirrhosis, was admitted for worsening anemia  1. macrocytic anemia, s/p 5 units blood transfusion 2. CHF exacerbation, precipitated by severe anemia 3. Acute on chronic kidney disease, stage III 4. Coronary artery disease, status post CABG 5. Atrial fibrillation status post pacemaker, on chronic eliquis  6. HTN 7. Liver cirrhosis, etiology has not been worked up  8. T2DM   Recommendations: -Lab reviewed, he developed worsening macrocytic anemia, with low reticulocyte count, no lab evidence of hemolysis. Folic acid and D32 levels are normal, no iron deficiency. This is likely multifactorial, related to liver cirrhosis and CKD, but the degree of anemia is out of proportion. Underlying bone marrow disease, especially MDS, needs to be ruled out.  Bone marrow biopsy was performed on 12/11/2019.  Results currently pending. -The patient underwent an EGD and colonoscopy  on 11/16 and was found to have several internal hemorrhoids, diverticulosis, and several polyps on colonoscopy.  EGD showed esophageal plaques consistent with candidiasis otherwise normal exam. -I agree with blood transfusion to keep hemoglobin above 8.5-9.0, given his heart disease   The patient and his wife expressed that they would like to follow-up with hematology here in [place].  We will arrange for outpatient follow-up to discuss the bone marrow biopsy results.   LOS: 5 days   Mikey Bussing, DNP, AGPCNP-BC, AOCNP 12/12/19   Addendum  I have seen the patient, examined him. I  agree with the assessment and and plan and have edited the notes.   Pt was sitting in chair, comfortable. I have reviewed his recent lab, history of blood transfusion, endoscopy findings, and the biopsy results.  I have discussed with our hematological pathologist Dr. Gari Crown around noon today.  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  Pending cytology and FISH pane to confirm.  I discussed the above with patient, his daughter at the bedside, and his wife over the phone.  I discussed MDS treatment in general.  He will likely need frequent lab,   EPO injections and office visit.  [facility] will be closer and more convenient for patient to follow-up, and I encouraged him to follow-up with Dr. Burlene Arnt, and will set up his lab and f/u with him next week. All questions were answered, and I left him my contact info also. OK to discharge home today from hematology standpoint. Discussed with primary team also.  Truitt Merle  12/12/2019

## 2019-12-12 NOTE — Discharge Summary (Signed)
Name: Evan [name] MRN: [id number] DOB: Apr 28, 1940 79 y.o. PCP: Leonel Ramsay, MD  Date of Admission: 12/07/2019  9:53 AM Date of Discharge: 12/12/2019 Attending Physician: Oda Kilts, MD  Discharge Diagnosis: 1. Principal Problem:   Symptomatic anemia Active Problems:   Diabetes mellitus (HCC)   Atrial fibrillation, permanent (HCC)   S/P TAVR (transcatheter aortic valve replacement)   Cirrhosis (HCC)   Acute on chronic heart failure with preserved ejection fraction (HFpEF) (HCC)   Acute renal failure superimposed on stage 3 chronic kidney disease ([place])  Discharge Medications: Allergies as of 12/12/2019      Reactions   Niacin Other (See Comments), Rash   Red face and burns.   Niacin And Related Rash, Other (See Comments)   Red face and burns.   Metformin And Related Diarrhea   Statins Other (See Comments)   Myalgias, weakness      Medication List    STOP taking these medications   apixaban 5 MG Tabs tablet Commonly known as: Eliquis   benazepril 40 MG tablet Commonly known as: LOTENSIN   hydrochlorothiazide 25 MG tablet Commonly known as: HYDRODIURIL   omeprazole 20 MG capsule Commonly known as: PRILOSEC     TAKE these medications   acetaminophen 650 MG CR tablet Commonly known as: TYLENOL Take 1,300 mg by mouth every 8 (eight) hours as needed for pain.   amLODipine 2.5 MG tablet Commonly known as: NORVASC Take 1 tablet (2.5 mg total) by mouth daily.   cyanocobalamin 1000 MCG tablet Take 1,000 mcg by mouth daily.   ezetimibe 10 MG tablet Commonly known as: ZETIA TAKE 1 TABLET BY MOUTH EVERY DAY   furosemide 40 MG tablet Commonly known as: LASIX Take 40 mg by mouth daily.   glimepiride 2 MG tablet Commonly known as: AMARYL TAKE 1 TABLET (2 MG TOTAL) BY MOUTH DAILY WITH BREAKFAST What changed:   how much to take  how to take this  when to take this  additional instructions   hydrALAZINE 25 MG tablet Commonly known as:  APRESOLINE Take 1 tablet (25 mg total) by mouth 2 (two) times daily.   loratadine 10 MG tablet Commonly known as: CLARITIN Take 10 mg by mouth daily.   nystatin 100000 UNIT/ML suspension Commonly known as: MYCOSTATIN Use as directed 5 mLs (500,000 Units total) in the mouth or throat 4 (four) times daily for 3 days.   pantoprazole 40 MG tablet Commonly known as: PROTONIX Take 1 tablet (40 mg total) by mouth daily. Start taking on: December 13, 2019   REFRESH OP Place 1 drop into both eyes daily as needed (for dry eyes).   tamsulosin 0.4 MG Caps capsule Commonly known as: FLOMAX TAKE 1 CAPSULE BY MOUTH ONCE DAILY      Disposition and follow-up:   Mr.Evan [name] was discharged from [facility] in Stable condition.  At the hospital follow up visit please address:  #Macrocytic anemia Patient to follow-up with Dr. 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 Patient will need to have plan for treatment moving forward and consideration for transfusion at follow-up. His home Eliquis is currently being held in the setting of his profound, recurring anemia, please consider risk/benefit assessment of restarting this medication.  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 [name] with cardiology on 11/23 for further management of his chronic heart failure. He will be discharged on his home regimen  of 28m lasix once daily. Please, assess appropriateness of this diuretic regimen. Additionally, patient's Eliquis was held following discharge in the setting of his profound anemia, and his home benazepril and HCTZ were held secondary to his AKI and mild hypotension, please assess the risks/benefits of restarting these medications.  #AKI Patient with AKI during hospitalization with creatine elevated to 2.48 from prior of 1.49 in March of 2021. Following blood transfusions and diuresis,  patient's creatinine stabilized around 2.1. At follow-up visit, please assess patient's renal function.  2.  Labs / imaging needed at time of follow-up: CBC, CMP  3.  Pending labs/ test needing follow-up: Final bone marrow biopsy results (Preliminary read by Dr. FBurr Medicoconsistent with MDS)  Follow-up Appointments:  Follow-up Information    JMartinique [name] M, MD Follow up on 12/17/2019.   Specialty: Cardiology Why: Please arrive 15 minutes early for your 4:20pm post-hospital cardiology appointment Contact information: [address]ona [phone]       BCammie Sickle MD. Go in 1 week(s).   Specialties: Internal Medicine, Oncology Contact information: [phone]        FLeonel Ramsay MD. Schedule an appointment as soon as possible for a visit in 1 week(s).   Specialty: Infectious Diseases Contact information: [phone]        CConstance Haw MD .   Specialty: Cardiology Contact information: [phone]       SWilford Corner MD. Schedule an appointment as soon as possible for a visit in 3 week(s).   Specialty: Gastroenterology Contact information: [address]Sykesville HospitalCourse by problem list: #Macrocytic Anemia [facility] of progressive shortness of breath, fatigue, and lightheadedness following lab draw at his cardiologist's office which revealed a hemoglobin of 6.2. Prior to arrival, patient underwent significant laboratory workup withDr. BRogue Bussingfor macrocytic anemia (hemoglobin of 9.5) in March of 2021 which was largely unremarkable with recommendation to undergo bone marrow biopsy and colonoscopy/endoscopy, however patient declined at that time. On  peripheral smear during this admission, patient found to have schistocytes, ovalocytes and teardrop cells. Remaining labs collected were unrevealing for a deficiency or hemolytic cause for his anemia. GI performed endoscopy/colonoscopy revealing diverticulosis, multiple polyps, as well as internal hemorrhoids. Although patient's endoscopic findings were suspected to possibly represent associated occult GI blood loss, these findings would not explain patient's macrocytosis and findings from his smear. Patient underwent bone marrow biopsy on 12/11/19 with evaluation by Dr. FErnestina Pennapreliminary read consistent with myelodysplastic syndrome. Dr. FBurr Medicodiscussed results with patient and wife at bedside with plan to follow-up with Dr. BRogue Bussingin the outpatient setting next week. Over course of admission, patient required 5 units of blood transfusion. Patient's hemoglobin was 9.1 on day of discharge.  #AoCKD4 Prior to admission, patient's last BMP revealed a creatinine of 1.49. Creatinine on arrival was 2.48 in the setting of his profound anemia and acute heart failure exacerbation. Following transfusion of 5 units of blood and diuresis, patient's creatinine improved to 2.14 on day of discharge with plan to follow-up with his PCP, cardiologist, and hematology/oncology.  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 On admission, patient's Pro-BNP was significantly elevated to 9,423 with radiologic evidence of pulmonary vascular congestion and interstitial edema. He received 465mIV lasix twice daily for the first 36 hours of his hospital course with appropriate response with transition to 4066mV lasix once daily per cardiology recommendations. Cardiology obtained repeat echocardiogram revealing LVEF of 45-50% with global hypokinesis and severely  elevated pulmonary artery systolic pressure. Cardiology signed off with recommendation to continue diuresis while admitted. Patient received daily IV lasix 105m30mtil day of discharge. He will follow-up with Dr. JordMartiniquethe outpatient setting.   Discharge Vitals:   BP 100/67 (BP Location: Right Arm)   Pulse 65   Temp 97.9 F (36.6 C) (Oral)   Resp 19   Ht 6' (1.829 m)   Wt 124.6 kg   SpO2 97%   BMI 37.24 kg/m   Pertinent Labs, Studies, and Procedures:  CBC Latest Ref Rng & Units 12/12/2019 12/11/2019 12/11/2019  WBC 4.0 - 10.5 K/uL 4.8 - 5.6  Hemoglobin 13.0 - 17.0 g/dL 9.1(L) 10.1(L) 8.4(L)  Hematocrit 39 - 52 % 28.6(L) 32.1(L) 27.2(L)  Platelets 150 - 400 K/uL 212 - 229   CMP Latest Ref Rng & Units 12/12/2019 12/11/2019 12/10/2019  Glucose 70 - 99 mg/dL 131(H) 147(H) 133(H)  BUN 8 - 23 mg/dL 43(H) 42(H) 42(H)  Creatinine 0.61 - 1.24 mg/dL 2.14(H) 2.19(H) 2.00(H)  Sodium 135 - 145 mmol/L 139 138 141  Potassium 3.5 - 5.1 mmol/L 4.1 3.9 3.8  Chloride 98 - 111 mmol/L 100 99 99  CO2 22 - 32 mmol/L '30 28 29  ' Calcium 8.9 - 10.3 mg/dL 8.6(L) 8.6(L) 9.2  Total Protein 6.5 - 8.1 g/dL 5.9(L) 5.8(L) 6.3(L)  Total Bilirubin 0.3 - 1.2 mg/dL 2.1(H) 2.2(H) 3.1(H)  Alkaline Phos 38 - 126 U/L 110 103 113  AST 15 - 41 U/L '19 23 18  ' ALT 0 - 44 U/L '17 17 16   ' CT BONE MARROW BIOPSY & ASPIRATION  Result Date: 12/11/2019 CLINICAL DATA:  Anemia EXAM: CT GUIDED DEEP ILIAC BONE ASPIRATION AND CORE BIOPSY TECHNIQUE: Patient was placed prone on the CT gantry and limited axial scans through the pelvis were obtained. Appropriate skin entry site was identified. Skin site was marked, prepped with chlorhexidine, draped in usual sterile fashion, and infiltrated locally with 1% lidocaine. Intravenous Fentanyl 50mc24md Versed 1mg w87m administered as conscious sedation during continuous monitoring of the patient's level of consciousness and physiological / cardiorespiratory status by the radiology RN, with a  total moderate sedation time of 20 minutes. Under CT fluoroscopic guidance an 11-gauge Cook trocar bone needle was advanced into the right iliac bone just lateral to the sacroiliac joint. Once needle tip position was confirmed, core and aspiration samples were obtained, submitted to pathology for approval. Post procedure scans show no hematoma or fracture. Patient tolerated procedure well. COMPLICATIONS: COMPLICATIONS none IMPRESSION: 1. Technically successful CT guided right iliac bone core and aspiration biopsy. Electronically Signed   By: D  HasLucrezia Europe  On: 12/11/2019 09:52   Discharge Instructions: Discharge Instructions    Call MD for:  difficulty breathing, headache or visual disturbances   Complete by: As directed    Call MD for:  extreme fatigue   Complete by: As directed    Call MD for:  hives   Complete by: As directed    Call MD for:  persistant dizziness or light-headedness   Complete by: As directed  Call MD for:  persistant nausea and vomiting   Complete by: As directed    Call MD for:  redness, tenderness, or signs of infection (pain, swelling, redness, odor or green/yellow discharge around incision site)   Complete by: As directed    Call MD for:  severe uncontrolled pain   Complete by: As directed    Call MD for:  temperature >100.4   Complete by: As directed    Diet - low sodium heart healthy   Complete by: As directed    Increase activity slowly   Complete by: As directed    No wound care   Complete by: As directed      Signed: Cato Mulligan, MD 12/12/2019, 4:05 PM   Pager: [phone]

## 2019-12-12 NOTE — Telephone Encounter (Signed)
Received message from Dr.Feng; pt had bmx- in [place].  C- schedule appt-on 11/22 at 8:30 MD; labs- cbc/cmp/ldh;HOLD tube.   Thanks GB  FYI-Dr.Feng.

## 2019-12-12 NOTE — Plan of Care (Signed)
  Problem: Clinical Measurements: Goal: Cardiovascular complication will be avoided Outcome: Progressing   Problem: Nutrition: Goal: Adequate nutrition will be maintained Outcome: Progressing   Problem: Coping: Goal: Level of anxiety will decrease Outcome: Progressing   Problem: Pain Managment: Goal: General experience of comfort will improve Outcome: Progressing   

## 2019-12-12 NOTE — Care Management Important Message (Signed)
Important Message  Patient Details  Name: Evan [name] MRN: [id number] Date of Birth: 1940/06/21   Medicare Important Message Given:  Yes     Shelda Altes 12/12/2019, 10:43 AM

## 2019-12-12 NOTE — Progress Notes (Signed)
Subjective:   Overnight, no acute events.  This morning, patient reports that his shortness of breath is stable with his baseline, however he slept sitting upright in the recliner rather than lying in bed. He denies any new symptoms or concerns. He is interested in going home, however he understands and agrees with the plan to remain hospitalized due to his repeated need for blood transfusions.  Objective:  Vital signs in last 24 hours: Vitals:   12/12/19 0430 12/12/19 0435 12/12/19 1000 12/12/19 1140  BP: (!) 103/54  (!) 116/53 100/67  Pulse: 64  67 65  Resp: '17  18 19  ' Temp: 98 F (36.7 C)  (!) 97.4 F (36.3 C) 97.9 F (36.6 C)  TempSrc: Oral  Oral Oral  SpO2: 98%  96% 97%  Weight:  124.6 kg    Height:      SpO2: 97 % O2 Flow Rate (L/min): 2 L/min  Intake/Output Summary (Last 24 hours) at 12/12/2019 1158 Last data filed at 12/12/2019 1106 Gross per 24 hour  Intake 720 ml  Output 900 ml  Net -180 ml   Filed Weights   12/10/19 0100 12/11/19 0510 12/12/19 0435  Weight: 122.2 kg 123.2 kg 124.6 kg  Physical Exam Vitals and nursing note reviewed.  Constitutional:      General: He is not in acute distress.    Appearance: He is obese.  HENT:     Head: Normocephalic and atraumatic.  Neck:     Vascular: Hepatojugular reflux and JVD present.  Cardiovascular:     Rate and Rhythm: Normal rate and regular rhythm.  Pulmonary:     Effort: Pulmonary effort is normal. No respiratory distress.     Breath sounds: Normal breath sounds.  Abdominal:     General: Bowel sounds are normal.     Palpations: Abdomen is soft.     Tenderness: There is no abdominal tenderness.  Musculoskeletal:        General: Normal range of motion.     Cervical back: Normal range of motion and neck supple.     Right lower leg: Edema present.     Left lower leg: Edema present.     Comments: 2+ pitting edema of bilateral lower extremities extending to the knees  Skin:    General: Skin is warm and  dry.     Capillary Refill: Capillary refill takes less than 2 seconds.  Neurological:     General: No focal deficit present.     Mental Status: He is alert.  Psychiatric:        Mood and Affect: Mood normal.        Behavior: Behavior normal.    CBC Latest Ref Rng & Units 12/12/2019 12/11/2019 12/11/2019  WBC 4.0 - 10.5 K/uL 4.8 - 5.6  Hemoglobin 13.0 - 17.0 g/dL 9.1(L) 10.1(L) 8.4(L)  Hematocrit 39 - 52 % 28.6(L) 32.1(L) 27.2(L)  Platelets 150 - 400 K/uL 212 - 229   CMP Latest Ref Rng & Units 12/12/2019 12/11/2019 12/10/2019  Glucose 70 - 99 mg/dL 131(H) 147(H) 133(H)  BUN 8 - 23 mg/dL 43(H) 42(H) 42(H)  Creatinine 0.61 - 1.24 mg/dL 2.14(H) 2.19(H) 2.00(H)  Sodium 135 - 145 mmol/L 139 138 141  Potassium 3.5 - 5.1 mmol/L 4.1 3.9 3.8  Chloride 98 - 111 mmol/L 100 99 99  CO2 22 - 32 mmol/L '30 28 29  ' Calcium 8.9 - 10.3 mg/dL 8.6(L) 8.6(L) 9.2  Total Protein 6.5 - 8.1 g/dL 5.9(L) 5.8(L) 6.3(L)  Total  Bilirubin 0.3 - 1.2 mg/dL 2.1(H) 2.2(H) 3.1(H)  Alkaline Phos 38 - 126 U/L 110 103 113  AST 15 - 41 U/L '19 23 18  ' ALT 0 - 44 U/L '17 17 16   ' Surgical pathology (bone marrow biopsy collected 11/17) - pending  IMAGING: No results found.  Assessment/Plan:  Principal Problem:   Symptomatic anemia Active Problems:   Diabetes mellitus (HCC)   Atrial fibrillation, permanent (HCC)   S/P TAVR (transcatheter aortic valve replacement)   Cirrhosis (HCC)   Acute on chronic heart failure with preserved ejection fraction (HFpEF) (HCC)   Acute renal failure superimposed on stage 3 chronic kidney disease ([place])  [name] is a 79 year old male with past medical history significant for chronic macrocytic anemia, diastolic CHF (last EF of 69-45% on 08/2018), persistent atrial fibrillation (s/p pacemaker, on Eliquis), cirrhosis, HTN, HLD, CAD s/p CABG 2005, carotid artery disease, aortic stenosis s/p TAVR, prior CVA (right PCA), T2DM, and chronic use of 2L O2 via [place] who presented to the [facility] ED on 11/13 due to shortness of breath found to have acute on chronic macrocytic anemia, acute on chronic CHF exacerbation as well as acute on chronic kidney disease.    #Symptomatic macrocytic anemia, active Hemoglobin this morning 9.1 down from post-transfusion H&H of 10.1 yesterday afternoon. Patient denies any new sources of bleeding. At this time, primary concern for patient's anemia is myelodysplastic syndrome with a possible component of occult GI blood loss in the setting of endoscopic findings of diverticulosis, polyps and internal hemorrhoids. Patient currently awaiting results of bone marrow biopsy performed yesterday. -Appreciate hematology/oncology recommendations -Continue holding home Eliquis (restart 12/15/19) -Continue home vitamin B12 supplementation, although B12 level elevated, defer to hematology/oncology -Daily CBC -Transfuse PRN  #Acute on chronic combined systolic and diastolic heart failure exacerbation, active Echocardiogram on admission revealing LVEF of 45-50% with global hypokinesis and severely elevated pulmonary artery systolic pressure. Despite continuing 59m IV lasix daily for past several days, patient appears volume overloaded on physical examination. His weight is up slightly (124.6kg from 122.3kg on admission), and he is +1,9826msince admission. -Escalate IV lasix to 4042mwice daily -Strict intake and output -Daily weights -Continue home supplemental oxygen of 2L -Cardiology signed off with plan to follow-up with Dr. JorMartinique outpatient setting  #Acute on chronic kidney disease stage 3a, active Creatinine stable this morning, most recently 2.14 down from 2.19. -Daily BMP, monitor creatinine closely with escalation of diuretic regimen -Avoid nephrotoxins -Holding home benazepril -Holding home HCTZ   #Atrial fibrillation s/p pacemaker, chronic Patient currently in normal sinus rhythm. Currently holding home Eliquis in the setting of anemia of  undetermined etiology. -Continue holding home Eliquis 5mg65mice daily, GI recommending restarting 11/21 -Telemetry   #HTN, chronic Patient's blood pressure in 100s038U/828Mtolic for past 24 hours. -Continue amlodipine 2.5mg 79mly -Continue hydralazine 25mg 2me daily -Holding home HCTZ 25mg d67m -Holding home benazepril 40mg da76m  #Cirrhosis, chronic Patient noted to have shrunken appearance and nodular contour of the liver suggestive of cirrhosis on CTA Abdomen Pelvis in March 2019. Patient has no history of alcoholism and hepatitis B and C laboratory workup was unremarkable. RUQ ultrasound consistent with prior. -follow-up with GI in 3-4 weeks  #Candida esophagitis, active -Nystatin 500,000 units 4 times daily for five days (day 2/5)   #T2DM, chronic Hemoglobin A1c of 6.6 on admission. Blood sugars well-controlled (124-143 in past 24 hours). -Moderate SSI -CBG monitoring 4 times daily, before meals and at bedtime  #  HLD, chronic -Continue home zetia 90m daily   #Code status: Full code #VTE ppx: SCDs, holding home Eliquis #Diet: Carb modified #IVF: 20cc/hr #Bowel regimen: None  JCato Mulligan MD 12/12/2019, 11:58 AM Pager: [phone] 5pm on weekdays and 1pm on weekends: On Call pager [phone]

## 2019-12-12 NOTE — TOC Transition Note (Signed)
Transition of Care [facility]) - CM/SW Discharge Note   Patient Details  Name: Evan [name] MRN: [id number] Date of Birth: 01-20-1941  Transition of [facility]) CM/SW Contact:  Zenon Mayo, RN Phone Number: 12/12/2019, 4:41 PM   Clinical Narrative:    Patient is for dc today, NCM received call from [facility] with [facility] she states patient is active with them for [facility].  NCM asked patient if he would like to continue with [facility] for [facility] services, he states yes.  NCM asked MD for [facility] orders.    Final next level of care: [place] Barriers to Discharge: No Barriers Identified   Patient Goals and CMS Choice        Discharge Placement                       Discharge Plan and Services                          HH Arranged: RN [facility] Agency: [place] ([facility]) Date HH Agency Contacted: 12/12/19 Time [place]: [phone] Representative spoke with at [place]: [place] Determinants of Health ([place]) Interventions     Readmission Risk Interventions No flowsheet data found.

## 2019-12-13 LAB — SURGICAL PATHOLOGY

## 2019-12-13 NOTE — Telephone Encounter (Signed)
Evan [name] with [facility] is following up regarding hospitalization. She states due to patient being in the hospital, a verbal order is required for the patient to resume care with them on 12/15/19. Please return call to assist.  Phone#: [phone] (opt# 2)

## 2019-12-13 NOTE — Telephone Encounter (Signed)
Verbal order given to resume home health./cy

## 2019-12-13 NOTE — Addendum Note (Signed)
Addended by: Gloris Ham on: 12/13/2019 08:06 AM   Modules accepted: Orders

## 2019-12-13 NOTE — Telephone Encounter (Signed)
Patient's wife calling back. She states she has a concern about the patient's eliquis since he has been off the medication.

## 2019-12-13 NOTE — Telephone Encounter (Signed)
I have called and spoke with Evan [name].  Hospital record and medication adjustment has been reviewed.  Her husband just released from the hospital yesterday.  After the recent colonoscopy, Dr. Michail Sermon of GI service recommended holding Eliquis for a minimum of 5 more days.  He was supposed to restart Eliquis after 11/21.  Wife is quite concerned as the patient remained very weak.  We discussed benefit and risk of Eliquis especially in light of the recent hospitalization, severe symptomatic anemia and his permanent atrial fibrillation.  Holding the Eliquis may help with the symptomatic anemia, however will increase his clotting risk.  If the patient continued to feel poorly and very weak, I think it would be reasonable to hold the Eliquis until he can be seen by either hematology next Monday or cardiology service by next Tuesday.  Note, wife is deeply involved in the patient's care.  I will be happy to give the patient on call should any questions arise.

## 2019-12-13 NOTE — Telephone Encounter (Signed)
Apts scheduled and lab orders entered

## 2019-12-14 NOTE — Progress Notes (Signed)
Cardiology Office Note:    Date:  12/17/2019   ID:  GILMAR BUA, DOB 07-16-1940, MRN [id number]  PCP:  Leonel Ramsay, MD  [facility] Cardiologist:  Luis Nickles [name], MD  [place] Electrophysiologist:  Will Meredith Leeds, MD   Referring MD: Leonel Ramsay, MD   Chief Complaint  Patient presents with  . Congestive Heart Failure  . Atrial Fibrillation    History of Present Illness:    ANGLE KAREL is a 79 y.o. male with a hx of CAD s/p CABG 2005, carotid artery disease, hypertension, hyperlipidemia, DM 2, permanent atrial fibrillation, pacemaker and severe AS s/p TAVR.  Cardiac catheterization in 2007 demonstrated patent grafts.  Myoview in 2013 showed no significant abnormality.  He had a left carotid enterectomy in May 2014.  He is intolerant to statins due to severe myalgia and weakness.  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  Anticoagulation was initially held, and he was treated with aspirin before restarting Eliquis later.  Sleep study in October 2017 showed borderline sleep apnea with minimal desaturation.  Echocardiogram in February 2019 showed severe aortic stenosis.  Dr. Curt Bears also recommended permanent pacemaker due to severe conduction disease, this was placed near the end of February with a St Jude MRI compatible dual-chamber pacemaker on 03/15/2017.  He underwent cardiac catheterization on 03/23/2017 which showed severe three-vessel CAD with patent LIMA to LAD, continued patency of free radial graft to OM with tight stenosis at the distal anastomosis involving a small caliber vessel, patent SVG to PDA, severe aortic stenosis.  He was evaluated by Dr. Burt Knack and underwent TAVR with a Berniece Pap THV size 26 mm bioprosthetic valve by Dr. Cyndia Bent in April 2019.  Follow-up echo in May showed good results.  Aspirin was discontinued to allow the patient to stay on Eliquis long-term.  Patient has been seen by oncology  service for microcytic anemia.  He also had possible cirrhosis on previous CT.  Extensive blood work-up was done and was unremarkable.  Bone biopsy was considered however patient declined.  Over the past couple of months his condition has deteriorated. He was seen in the office by Almyra Deforest PA-C. Noted increase in edema and weight gain. Labs were drawn and indicated severe anemia with Hgb down to 6. He was admitted. He was transfused and diuresed. GI evaluation was negative for source of bleeding. He did undergo Bone marrow Bx showing myelodysplasia. Eliquis was held. His benazepril and HCTZ were also held due to hypotension and AKI. Creatinine stabilized at 2.1. Hgb was 9.1. He was DC on lasix 40 mg daily. Echo showed EF of 45-50%. Pulmonary HTN. Satisfactory TAVR function.  Since DC he has increased LE edema. Weight is up 3 lbs. Notes he is not eating well. He is weak and SOB. Seen by hematology yesterday- Dr Rogue Bussing. Hgb up to 9.9 and creatinine improved to 1.6. electrolytes OK. Patient is not keeping feet elevated.    Past Medical History:  Diagnosis Date  . Anemia   . Carotid arterial disease ([place])   . CHF (congestive heart failure) ([place])   . Coronary artery disease   . Hearing loss   . HOH (hard of hearing)   . Hyperlipidemia   . Hypertension   . Nocturia   . Obesity   . Osteoarthritis    "BUE; BLE; right knee" (03/23/2017)  . Peripheral vision loss, bilateral    S/P 07/2015  . Permanent atrial fibrillation (  [place])   . 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 Severe aortic stenosis   . Sinus drainage   . Stroke [facility]) 07/2015   "peripheral vision still bad out of left eye" (03/23/2017)  . Torn rotator cuff 2012   Right arm  . Type II diabetes mellitus ([place])   . Urinary frequency     Past Surgical History:  Procedure Laterality Date  . BACK SURGERY    . CARDIAC CATHETERIZATION  11/29/2005   SEVERE THREE VESSEL OBSTRUCTIVE ATHERSCLEROTIC CAD. ALL GRAFTS WERE  PATENT. EF 65%  . CARDIAC CATHETERIZATION  03/23/2017  . CAROTID ENDARTERECTOMY Right 01-29-08   cea  . CARPAL TUNNEL RELEASE Right   . COLONOSCOPY N/A 12/10/2019   Procedure: COLONOSCOPY;  Surgeon: Wilford Corner, MD;  Location: [facility] ENDOSCOPY;  Service: Endoscopy;  Laterality: N/A;  . CORONARY ARTERY BYPASS GRAFT  03/2003   X3. LIMA GRAFT TO THE LAD, SAPHENOUS VEIN GRAFT TO THE PA, AND A LEFT RADIAL GRAFT TO THEOBTUSE MARGINAL VESSEL  . ENDARTERECTOMY Left 05/28/2012   Procedure: ENDARTERECTOMY CAROTID;  Surgeon: Elam Dutch, MD;  Location: [place];  Service: Vascular;  Laterality: Left;  . ESOPHAGOGASTRODUODENOSCOPY N/A 12/10/2019   Procedure: ESOPHAGOGASTRODUODENOSCOPY (EGD);  Surgeon: Wilford Corner, MD;  Location: [place];  Service: Endoscopy;  Laterality: N/A;  . HEMOSTASIS CLIP PLACEMENT  12/10/2019   Procedure: HEMOSTASIS CLIP PLACEMENT;  Surgeon: Wilford Corner, MD;  Location: [place] ENDOSCOPY;  Service: Endoscopy;;  . INSERT / REPLACE / REMOVE PACEMAKER  03/23/2017  . [place]; 1984  . MOLE SURGERY     "? face/arms"  . PACEMAKER IMPLANT N/A 03/23/2017   Procedure: PACEMAKER IMPLANT;  Surgeon: Constance Haw, MD;  Location: [place] CV LAB;  Service: Cardiovascular;  Laterality: N/A;  . PATCH ANGIOPLASTY Left 05/28/2012   Procedure: WITH dACRON PATCH ANGIOPLASTY;  Surgeon: Elam Dutch, MD;  Location: [place];  Service: Vascular;  Laterality: Left;  . POLYPECTOMY  12/10/2019   Procedure: POLYPECTOMY;  Surgeon: Wilford Corner, MD;  Location: [facility] ENDOSCOPY;  Service: Endoscopy;;  . RIGHT/LEFT HEART CATH AND CORONARY/GRAFT ANGIOGRAPHY N/A 03/23/2017   Procedure: RIGHT/LEFT HEART CATH AND CORONARY/GRAFT ANGIOGRAPHY;  Surgeon: Sherren Mocha, MD;  Location: [place] CV LAB;  Service: Cardiovascular;  Laterality: N/A;  . TEE WITHOUT CARDIOVERSION N/A 05/09/2017   Procedure: TRANSESOPHAGEAL ECHOCARDIOGRAM (TEE);  Surgeon: Sherren Mocha, MD;   Location: [place];  Service: Open Heart Surgery;  Laterality: N/A;  . TRANSCATHETER AORTIC VALVE REPLACEMENT, TRANSFEMORAL N/A 05/09/2017   Procedure: TRANSCATHETER AORTIC VALVE REPLACEMENT, TRANSFEMORAL using an Edwards 41mm Sapien 3 Aortic Valve;  Surgeon: Sherren Mocha, MD;  Location: [place];  Service: Open Heart Surgery;  Laterality: N/A;    Current Medications: Current Meds  Medication Sig  . acetaminophen (TYLENOL) 650 MG CR tablet Take 1,300 mg by mouth every 8 (eight) hours as needed for pain.   Marland Kitchen amLODipine (NORVASC) 2.5 MG tablet Take 1 tablet (2.5 mg total) by mouth daily.  . cyanocobalamin 1000 MCG tablet Take 1,000 mcg by mouth daily.   Marland Kitchen ezetimibe (ZETIA) 10 MG tablet TAKE 1 TABLET BY MOUTH EVERY DAY (Patient taking differently: Take 10 mg by mouth daily. )  . furosemide (LASIX) 40 MG tablet Take 40 mg by mouth 2 (two) times daily.  Marland Kitchen glimepiride (AMARYL) 2 MG tablet TAKE 1 TABLET (2 MG TOTAL) BY MOUTH DAILY WITH BREAKFAST (Patient taking differently: Take 2 mg by mouth daily with breakfast. )  . hydrALAZINE (  APRESOLINE) 25 MG tablet Take 1 tablet (25 mg total) by mouth 2 (two) times daily.  Marland Kitchen loratadine (CLARITIN) 10 MG tablet Take 10 mg by mouth daily.  . pantoprazole (PROTONIX) 40 MG tablet Take 1 tablet (40 mg total) by mouth daily.  . Polyvinyl Alcohol-Povidone (REFRESH OP) Place 1 drop into both eyes daily as needed (for dry eyes).   . tamsulosin (FLOMAX) 0.4 MG CAPS capsule TAKE 1 CAPSULE BY MOUTH ONCE DAILY (Patient taking differently: Take 0.4 mg by mouth daily. )  . [DISCONTINUED] nystatin (NYSTATIN) powder Apply 1 application topically 3 (three) times daily.     Allergies:   Niacin, Niacin and related, Metformin and related, and Statins   Social History   Socioeconomic History  . Marital status: Married    Spouse name: Not on file  . Number of children: 5  . Years of education: Not on file  . Highest education level: Not on file  Occupational History  .  Occupation: retired    Fish farm manager: RETIRED  Tobacco Use  . Smoking status: Former Smoker    Packs/day: 2.00    Years: 39.00    Pack years: 78.00    Types: Cigarettes    Quit date: 01/24/1994    Years since quitting: 25.9  . Smokeless tobacco: Never Used  Vaping Use  . Vaping Use: Never used  Substance and Sexual Activity  . Alcohol use: No  . Drug use: No  . Sexual activity: Never  Other Topics Concern  . Not on file  Social History Narrative   On Farm/ in [place]; no alcohol; quit smoking- 96; worked in Estate agent.    Social Determinants of Health   Financial Resource Strain:   . Difficulty of Paying Living Expenses: Not on file  Food Insecurity:   . Worried About Charity fundraiser in the Last Year: Not on file  . Ran Out of Food in the Last Year: Not on file  Transportation Needs:   . Lack of Transportation (Medical): Not on file  . Lack of Transportation (Non-Medical): Not on file  Physical Activity:   . Days of Exercise per Week: Not on file  . Minutes of Exercise per Session: Not on file  Stress:   . Feeling of Stress : Not on file  Social Connections:   . Frequency of Communication with Friends and Family: Not on file  . Frequency of Social Gatherings with Friends and Family: Not on file  . Attends Religious Services: Not on file  . Active Member of Clubs or Organizations: Not on file  . Attends Archivist Meetings: Not on file  . Marital Status: Not on file     Family History: The patient's family history includes Arrhythmia in his brother; Cancer in his brother; Heart attack in his brother and father; Parkinsonism in his brother.  ROS:   Please see the history of present illness.     All other systems reviewed and are negative.  EKGs/Labs/Other Studies Reviewed:    The following studies were reviewed today:  Echo 08/27/2018 1. The left ventricle has normal systolic function, with an ejection  fraction of 55-60%. The cavity size  was normal. There is moderately  increased left ventricular wall thickness. Left ventricular diastolic  function could not be evaluated secondary to  atrial fibrillation. Elevated mean left atrial pressure.  2. The right ventricle has normal systolic function. The cavity was  moderately enlarged. Right ventricular systolic pressure is mildly  elevated  with an estimated pressure of 33.2 mmHg.  3. Left atrial size was severely dilated.  4. Right atrial size was severely dilated.  5. The mitral valve is abnormal. There is moderate mitral annular  calcification present.  6. The tricuspid valve is grossly normal.  7. A Edwards Sapien bioprosthetic aortic valve (TAVR) valve is present in  the aortic position. Procedure Date: 04/29/2017 Normal aortic valve  prosthesis.  8. The aorta is normal in size and structure.  9. The inferior vena cava was dilated in size with >50% respiratory  variability.  10. Normal LV systolic function; moderate LVH; biatrial enlargement;  moderate RVE; s/p AVR with mean gradient of 17 mmHg and no AI.   Echo 12/08/19: IMPRESSIONS    1. Left ventricular ejection fraction, by estimation, is 45 to 50%. The  left ventricle has mildly decreased function. The left ventricle  demonstrates global hypokinesis. There is moderate concentric left  ventricular hypertrophy. Left ventricular  diastolic function could not be evaluated.  2. Right ventricular systolic function is moderately reduced. The right  ventricular size is moderately enlarged. There is severely elevated  pulmonary artery systolic pressure. The estimated right ventricular  systolic pressure is 70.6 mmHg.  3. Left atrial size was severely dilated.  4. Right atrial size was moderately dilated.  5. The mitral valve is degenerative. Mild mitral valve regurgitation.  Moderate to severe mitral annular calcification.  6. The aortic valve has been repaired/replaced. Aortic valve  regurgitation is not  visualized. Procedure Date: 04/29/2017. Echo findings  are consistent with normal structure and function of the aortic valve  prosthesis. Aortic valve mean gradient measures  11.0 mmHg. Aortic valve Vmax measures 2.24 m/s.  7. The inferior vena cava is dilated in size with <50% respiratory  variability, suggesting right atrial pressure of 15 mmHg.   Comparison(s): Prior images reviewed side by side. The left ventricular  function is worsened. The right ventricular systolic function is worse.  Pulmonary artery systolic pressure and right atrial pressure are higher.  TAVR gradients are improved (possibly  due to lower cardiac output).   EKG:  EKG is not ordered today.    Recent Labs: 12/06/2019: NT-Pro BNP 9,423 12/16/2019: ALT 25; BUN 31; Creatinine, Ser 1.60; Hemoglobin 9.9; Platelets 227; Potassium 3.8; Sodium 141  Recent Lipid Panel    Component Value Date/Time   CHOL 152 08/06/2018 1201   TRIG 107 08/06/2018 1201   HDL 42 08/06/2018 1201   CHOLHDL 3.6 08/06/2018 1201   CHOLHDL 2.3 12/11/2015 0854   VLDL 12 12/11/2015 0854   LDLCALC 89 08/06/2018 1201     Risk Assessment/Calculations:     CHA2DS2-VASc Score = 6  This indicates a 9.7% annual risk of stroke. The patient's score is based upon: CHF History: 1 HTN History: 1 Diabetes History: 1 Stroke History: 0 Vascular Disease History: 1 Age Score: 2 Gender Score: 0     Physical Exam:    VS:  BP 136/61   Pulse 67   Ht 6' (1.829 m)   Wt 272 lb (123.4 kg)   SpO2 98%   BMI 36.89 kg/m     Wt Readings from Last 3 Encounters:  12/17/19 272 lb (123.4 kg)  12/16/19 276 lb (125.2 kg)  12/12/19 274 lb 9.6 oz (124.6 kg)     GEN:  Frail, chronically ill appearing, obese HEENT: Normal NECK: No JVD; No carotid bruits LYMPHATICS: No lymphadenopathy CARDIAC: Irregularly irregular, no murmurs, rubs, gallops RESPIRATORY:  Left basilar rales.  ABDOMEN:  Soft, non-tender, non-distended MUSCULOSKELETAL:  No edema; No  deformity  SKIN: Warm and dry Ext: 2-3+ pitting edema below the knees bilaterally. NEUROLOGIC:  Alert and oriented x 3 PSYCHIATRIC:  Normal affect   ASSESSMENT:    1. Acute on chronic combined systolic and diastolic CHF (congestive heart failure) ([place])   2. Coronary artery disease involving coronary bypass graft of native heart without angina pectoris   3. S/p TAVR (transcatheter aortic valve replacement), bioprosthetic   4. Permanent atrial fibrillation ([place])   5. Bilateral carotid artery stenosis   6. Myelodysplasia (myelodysplastic syndrome) ([place])   7. Anemia due to bone marrow failure, unspecified bone marrow failure type ([place])    PLAN:    In order of problems listed above:  1. Acute on chronic combined systolic/diastolic CHF. EF 45-50% by recent Echo. ACEi held due to hypotension and AKI. Renal function and BP improved. Recent CHF exacerbated by severe anemia. Plan to increase lasix to 80 mg bid for 3 days then 40 mg bid. Needs to restrict salt intake and keep feet elevated. Will arrange follow up in 3-4 weeks.   2. CAD s/p CABG: No recent chest pain  3. Carotid artery disease: Mild disease bilaterally on last carotid Doppler on 08/28/2018.  He has known right subclavian artery stenosis.  4. Hypertension: Blood pressure is now elevated. This will allow more leeway for his diuresis. On low dose amlodipine.   5. Hyperlipidemia: Intolerant of statins  6. DM2: Managed by primary care provider  7. Permanent atrial fibrillation: rate controlled.  With myelodysplasia risk of anemia and bleeding is substantially higher. Will continue to hold Eliquis for now.   8. History of TAVR: Stable on last echocardiogram.    9. 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 Improved. Will monitor with increased diuretics.     Shared Decision Making/Informed Consent        Medication Adjustments/Labs and Tests Ordered: Current medicines are reviewed at  length with the patient today.  Concerns regarding medicines are outlined above.  No orders of the defined types were placed in this encounter.  No orders of the defined types were placed in this encounter.   Patient Instructions  Increase lasix to 80  mg twice a day for 3 days then 40 mg bid.   Follow up in 4 weeks.    Signed, Jaloni Sorber [name], MD  12/17/2019 4:55 PM    [facility]

## 2019-12-16 ENCOUNTER — Other Ambulatory Visit: Payer: Self-pay

## 2019-12-16 ENCOUNTER — Inpatient Hospital Stay: Payer: Medicare Other | Attending: Internal Medicine

## 2019-12-16 ENCOUNTER — Inpatient Hospital Stay (HOSPITAL_BASED_OUTPATIENT_CLINIC_OR_DEPARTMENT_OTHER): Payer: Medicare Other | Admitting: Internal Medicine

## 2019-12-16 ENCOUNTER — Telehealth: Payer: Self-pay | Admitting: Internal Medicine

## 2019-12-16 ENCOUNTER — Encounter: Payer: Self-pay | Admitting: Internal Medicine

## 2019-12-16 DIAGNOSIS — R5383 Other fatigue: Secondary | ICD-10-CM | POA: Insufficient documentation

## 2019-12-16 DIAGNOSIS — Z951 Presence of aortocoronary bypass graft: Secondary | ICD-10-CM | POA: Diagnosis not present

## 2019-12-16 DIAGNOSIS — Z7901 Long term (current) use of anticoagulants: Secondary | ICD-10-CM | POA: Diagnosis not present

## 2019-12-16 DIAGNOSIS — I110 Hypertensive heart disease with heart failure: Secondary | ICD-10-CM | POA: Diagnosis not present

## 2019-12-16 DIAGNOSIS — D539 Nutritional anemia, unspecified: Secondary | ICD-10-CM | POA: Diagnosis not present

## 2019-12-16 DIAGNOSIS — Z8249 Family history of ischemic heart disease and other diseases of the circulatory system: Secondary | ICD-10-CM | POA: Insufficient documentation

## 2019-12-16 DIAGNOSIS — Z79899 Other long term (current) drug therapy: Secondary | ICD-10-CM | POA: Diagnosis not present

## 2019-12-16 DIAGNOSIS — E785 Hyperlipidemia, unspecified: Secondary | ICD-10-CM | POA: Insufficient documentation

## 2019-12-16 DIAGNOSIS — I6523 Occlusion and stenosis of bilateral carotid arteries: Secondary | ICD-10-CM

## 2019-12-16 DIAGNOSIS — Z950 Presence of cardiac pacemaker: Secondary | ICD-10-CM | POA: Insufficient documentation

## 2019-12-16 DIAGNOSIS — Z87891 Personal history of nicotine dependence: Secondary | ICD-10-CM | POA: Diagnosis not present

## 2019-12-16 DIAGNOSIS — I509 Heart failure, unspecified: Secondary | ICD-10-CM | POA: Insufficient documentation

## 2019-12-16 DIAGNOSIS — E119 Type 2 diabetes mellitus without complications: Secondary | ICD-10-CM | POA: Diagnosis not present

## 2019-12-16 DIAGNOSIS — Z7189 Other specified counseling: Secondary | ICD-10-CM

## 2019-12-16 DIAGNOSIS — Z7984 Long term (current) use of oral hypoglycemic drugs: Secondary | ICD-10-CM | POA: Diagnosis not present

## 2019-12-16 DIAGNOSIS — D469 Myelodysplastic syndrome, unspecified: Secondary | ICD-10-CM | POA: Insufficient documentation

## 2019-12-16 DIAGNOSIS — D4620 Refractory anemia with excess of blasts, unspecified: Secondary | ICD-10-CM | POA: Diagnosis not present

## 2019-12-16 DIAGNOSIS — Z952 Presence of prosthetic heart valve: Secondary | ICD-10-CM | POA: Insufficient documentation

## 2019-12-16 DIAGNOSIS — Z8673 Personal history of transient ischemic attack (TIA), and cerebral infarction without residual deficits: Secondary | ICD-10-CM | POA: Diagnosis not present

## 2019-12-16 DIAGNOSIS — I2510 Atherosclerotic heart disease of native coronary artery without angina pectoris: Secondary | ICD-10-CM | POA: Diagnosis not present

## 2019-12-16 DIAGNOSIS — I4821 Permanent atrial fibrillation: Secondary | ICD-10-CM | POA: Diagnosis not present

## 2019-12-16 DIAGNOSIS — R0602 Shortness of breath: Secondary | ICD-10-CM | POA: Diagnosis not present

## 2019-12-16 LAB — COMPREHENSIVE METABOLIC PANEL
ALT: 25 U/L (ref 0–44)
AST: 22 U/L (ref 15–41)
Albumin: 4 g/dL (ref 3.5–5.0)
Alkaline Phosphatase: 125 U/L (ref 38–126)
Anion gap: 12 (ref 5–15)
BUN: 31 mg/dL — ABNORMAL HIGH (ref 8–23)
CO2: 30 mmol/L (ref 22–32)
Calcium: 9.2 mg/dL (ref 8.9–10.3)
Chloride: 99 mmol/L (ref 98–111)
Creatinine, Ser: 1.6 mg/dL — ABNORMAL HIGH (ref 0.61–1.24)
GFR, Estimated: 44 mL/min — ABNORMAL LOW (ref >60)
Glucose, Bld: 121 mg/dL — ABNORMAL HIGH (ref 70–99)
Potassium: 3.8 mmol/L (ref 3.5–5.1)
Sodium: 141 mmol/L (ref 135–145)
Total Bilirubin: 2.6 mg/dL — ABNORMAL HIGH (ref 0.3–1.2)
Total Protein: 7 g/dL (ref 6.5–8.1)

## 2019-12-16 LAB — CBC WITH DIFFERENTIAL/PLATELET
Abs Immature Granulocytes: 0.43 10*3/uL — ABNORMAL HIGH (ref 0.00–0.07)
Basophils Absolute: 0.1 10*3/uL (ref 0.0–0.1)
Basophils Relative: 2 %
Eosinophils Absolute: 0.4 10*3/uL (ref 0.0–0.5)
Eosinophils Relative: 9 %
HCT: 30.6 % — ABNORMAL LOW (ref 39.0–52.0)
Hemoglobin: 9.9 g/dL — ABNORMAL LOW (ref 13.0–17.0)
Immature Granulocytes: 9 %
Lymphocytes Relative: 15 %
Lymphs Abs: 0.7 10*3/uL (ref 0.7–4.0)
MCH: 32.7 pg (ref 26.0–34.0)
MCHC: 32.4 g/dL (ref 30.0–36.0)
MCV: 101 fL — ABNORMAL HIGH (ref 80.0–100.0)
Monocytes Absolute: 0.3 10*3/uL (ref 0.1–1.0)
Monocytes Relative: 5 %
Neutro Abs: 3.1 10*3/uL (ref 1.7–7.7)
Neutrophils Relative %: 60 %
Platelets: 227 10*3/uL (ref 150–400)
RBC: 3.03 MIL/uL — ABNORMAL LOW (ref 4.22–5.81)
RDW: 20 % — ABNORMAL HIGH (ref 11.5–15.5)
Smear Review: NORMAL
WBC: 5.1 10*3/uL (ref 4.0–10.5)
nRBC: 0 % (ref 0.0–0.2)

## 2019-12-16 LAB — SAMPLE TO BLOOD BANK

## 2019-12-16 LAB — LACTATE DEHYDROGENASE: LDH: 207 U/L — ABNORMAL HIGH (ref 98–192)

## 2019-12-16 NOTE — Telephone Encounter (Signed)
On 11/22- spoke to pt's daughter regarding patient's diagnosis of MDS/low-grade leukemia.  Discussed regarding use of Procrit.  Awaiting further studies as scheduled for further treatment options. In agreement.  Evan [name]

## 2019-12-16 NOTE — Assessment & Plan Note (Addendum)
#  MDS: low grade [nadir hb-6.5- NOV 2021]; s/p bone marrow biopsy; cytogenetics pending.  Discussed treatment options mainly depend upon his cytogenetics.  Also recommend foundation heme.  However given his multiple comorbidities borderline performance status-hypomethylating agents might be prohibitive.  See below  # Severe anemia-multifactorial/MDS [as above]; CKD.  Today hemoglobin is 9.9.  Recommend start erythropoietin stimulating agent. Discussed use of erythropoietin stimulating agents like retacrit to stimulate the bone marrow.  Discussed the potential issues with erythropoietin estimating agents-given the risk of stroke thromboembolic events/elevated blood pressure.  However, most of the serious events did not happen when the goal hematocrit is 33/hemoglobin 30.  I also discussed the use of IV iron infusion; and the potential infusion reactions.  # CKD stage III [GFR ~46/creatinine 1.6]-multifactorial monitor closely on diuretics.  #CHF-noted to have significant weight gain/swelling in the legs-defer to cardiology regarding increasing Lasix appointment tomorrow.  # A. Fib-Eliquis currently on HOLD sec to severe anemia- [Dr.Jordan]-today hemoglobin is 9.7; given the absence of any active bleeding; reasonable to restart Eliquis; defer to cardiology.  I spoke at length with the patient's wife and daughter over the phone]- regarding the patient's clinical status/plan of care.  Family agreement.   #DISPOSITION: # 1 week- labs- erythorpoietin; H&H- RETACRIT # 2 weeks- labs- H&H- retacrit # 3 weeks-MD' labs- cbc/bmp-retacrit- Dr.B  Cc; [place]/Jordan

## 2019-12-16 NOTE — Telephone Encounter (Signed)
On 11/22- I spoke to pt's daughter

## 2019-12-16 NOTE — Progress Notes (Signed)
[place] NOTE  Patient Care Team: Leonel Ramsay, MD as PCP - General (Infectious Diseases) [name], [name] M, MD as PCP - Cardiology (Cardiology) Constance Haw, MD as PCP - Electrophysiology (Cardiology) Cammie Sickle, MD as Consulting Physician (Hematology and Oncology)  CHIEF COMPLAINTS/PURPOSE OF CONSULTATION: MDS  HEMATOLOGY HISTORY  Oncology History Overview Note  # NOV 2021-LOW GRADE MDS- Macrocytic ANEMIA [since 2016]- no colo; CT scan march 2019-cirrhosis/ no splenomegaly [no alcohol]; NOV 2021- Hospitalization-  [Bone marrow Nov 2021- [place]/Dr.Feng]-The bone marrow is hypercellular for age with dyspoietic changes primarily involving the megakaryocytic cell line with many small and/or hypolobated forms.  Despite limited overall findings, the changes are concerning for a low-grade myelodysplastic syndrome although secondary  changes cannot be entirely excluded; s/p 5 minutes of PRBC transfusion  # CKD-III   # CAD/CHF [Dr.Jordan]; CABG [2005] PPP/ AVR s/p TAVR on ELiquis [NOv 2021- on hold post dc]; DM;  Cirrhosis; EGD/[place] NOV 2021- [esophageal candidiasis; multiple polyps; Dr.Schooler; [place]]    MDS (myelodysplastic syndrome), low grade ([place])  12/16/2019 Initial Diagnosis   MDS (myelodysplastic syndrome), low grade ([place])      HISTORY OF PRESENTING ILLNESS:  Evan [name] 79 y.o.  male is here for follow-up for his anemia.  The interim patient was admitted to the hospital in [facility] for worsening shortness of breath noted to have worsening anemia [hb 6]; acute congestive heart failure.  Patient evaluated by cardiology.  Also underwent EGD colonoscopy-without any obvious source of bleeding. Patient had a bone marrow biopsy that showed above MDS.  Patient was taken off his Eliquis given his severe anemia. Patient is here for follow-up to discuss further treatment options.    Patient continues to have shortness of breath especially  exertion.  Complains of swelling in the legs.  Patient is in a wheelchair; also on 2 L of nasal cannula oxygen.   Review of Systems  Constitutional: Positive for malaise/fatigue. Negative for chills, diaphoresis, fever and weight loss.  HENT: Negative for nosebleeds and sore throat.   Eyes: Negative for double vision.  Respiratory: Positive for shortness of breath. Negative for cough, hemoptysis, sputum production and wheezing.   Cardiovascular: Positive for leg swelling. Negative for chest pain, palpitations and orthopnea.  Gastrointestinal: Negative for abdominal pain, blood in stool, constipation, diarrhea, heartburn, melena, nausea and vomiting.  Genitourinary: Negative for dysuria and urgency.  Musculoskeletal: Negative for back pain and joint pain.  Skin: Negative.  Negative for itching and rash.  Neurological: Negative for dizziness, tingling, focal weakness, weakness and headaches.  Endo/Heme/Allergies: Does not bruise/bleed easily.  Psychiatric/Behavioral: Negative for depression. The patient is not nervous/anxious and does not have insomnia.     MEDICAL HISTORY:  Past Medical History:  Diagnosis Date  . Anemia   . Carotid arterial disease ([place])   . CHF (congestive heart failure) ([place])   . Coronary artery disease   . Hearing loss   . HOH (hard of hearing)   . Hyperlipidemia   . Hypertension   . Nocturia   . Obesity   . Osteoarthritis    "BUE; BLE; right knee" (03/23/2017)  . Peripheral vision loss, bilateral    S/P 07/2015  . Permanent atrial fibrillation ([place])   . 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 Severe aortic stenosis   . Sinus drainage   . Stroke [facility]) 07/2015   "peripheral vision still bad out of left eye" (03/23/2017)  . Torn rotator cuff 2012   Right  arm  . Type II diabetes mellitus ([place])   . Urinary frequency     SURGICAL HISTORY: Past Surgical History:  Procedure Laterality Date  . BACK SURGERY    . CARDIAC CATHETERIZATION   11/29/2005   SEVERE THREE VESSEL OBSTRUCTIVE ATHERSCLEROTIC CAD. ALL GRAFTS WERE PATENT. EF 65%  . CARDIAC CATHETERIZATION  03/23/2017  . CAROTID ENDARTERECTOMY Right 01-29-08   cea  . CARPAL TUNNEL RELEASE Right   . COLONOSCOPY N/A 12/10/2019   Procedure: COLONOSCOPY;  Surgeon: Wilford Corner, MD;  Location: [facility] ENDOSCOPY;  Service: Endoscopy;  Laterality: N/A;  . CORONARY ARTERY BYPASS GRAFT  03/2003   X3. LIMA GRAFT TO THE LAD, SAPHENOUS VEIN GRAFT TO THE PA, AND A LEFT RADIAL GRAFT TO THEOBTUSE MARGINAL VESSEL  . ENDARTERECTOMY Left 05/28/2012   Procedure: ENDARTERECTOMY CAROTID;  Surgeon: Elam Dutch, MD;  Location: [facility] [employer];  Service: Vascular;  Laterality: Left;  . ESOPHAGOGASTRODUODENOSCOPY N/A 12/10/2019   Procedure: ESOPHAGOGASTRODUODENOSCOPY (EGD);  Surgeon: Wilford Corner, MD;  Location: [place];  Service: Endoscopy;  Laterality: N/A;  . HEMOSTASIS CLIP PLACEMENT  12/10/2019   Procedure: HEMOSTASIS CLIP PLACEMENT;  Surgeon: Wilford Corner, MD;  Location: [place] ENDOSCOPY;  Service: Endoscopy;;  . INSERT / REPLACE / REMOVE PACEMAKER  03/23/2017  . [place]; 1984  . MOLE SURGERY     "? face/arms"  . PACEMAKER IMPLANT N/A 03/23/2017   Procedure: PACEMAKER IMPLANT;  Surgeon: Constance Haw, MD;  Location: [place] CV LAB;  Service: Cardiovascular;  Laterality: N/A;  . PATCH ANGIOPLASTY Left 05/28/2012   Procedure: WITH dACRON PATCH ANGIOPLASTY;  Surgeon: Elam Dutch, MD;  Location: [place];  Service: Vascular;  Laterality: Left;  . POLYPECTOMY  12/10/2019   Procedure: POLYPECTOMY;  Surgeon: Wilford Corner, MD;  Location: [facility] ENDOSCOPY;  Service: Endoscopy;;  . RIGHT/LEFT HEART CATH AND CORONARY/GRAFT ANGIOGRAPHY N/A 03/23/2017   Procedure: RIGHT/LEFT HEART CATH AND CORONARY/GRAFT ANGIOGRAPHY;  Surgeon: Sherren Mocha, MD;  Location: [place] CV LAB;  Service: Cardiovascular;  Laterality: N/A;  . TEE WITHOUT CARDIOVERSION N/A 05/09/2017    Procedure: TRANSESOPHAGEAL ECHOCARDIOGRAM (TEE);  Surgeon: Sherren Mocha, MD;  Location: [place];  Service: Open Heart Surgery;  Laterality: N/A;  . TRANSCATHETER AORTIC VALVE REPLACEMENT, TRANSFEMORAL N/A 05/09/2017   Procedure: TRANSCATHETER AORTIC VALVE REPLACEMENT, TRANSFEMORAL using an Edwards 47m Sapien 3 Aortic Valve;  Surgeon: CSherren Mocha MD;  Location: [place]  Service: Open Heart Surgery;  Laterality: N/A;    SOCIAL HISTORY: Social History   Socioeconomic History  . Marital status: Married    Spouse name: Not on file  . Number of children: 5  . Years of education: Not on file  . Highest education level: Not on file  Occupational History  . Occupation: retired    [employer] farm manager RETIRED  Tobacco Use  . Smoking status: Former Smoker    Packs/day: 2.00    Years: 39.00    Pack years: 78.00    Types: Cigarettes    Quit date: 01/24/1994    Years since quitting: 25.9  . Smokeless tobacco: Never Used  Vaping Use  . Vaping Use: Never used  Substance and Sexual Activity  . Alcohol use: No  . Drug use: No  . Sexual activity: Never  Other Topics Concern  . 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  Difficulty of Paying Living Expenses: Not on file  Food Insecurity:   . Worried About Charity fundraiser in the Last Year: Not on file  . Ran Out of Food in the Last Year: Not on file  Transportation Needs:   . Lack of Transportation (Medical): Not on file  . Lack of Transportation (Non-Medical): Not on file  Physical Activity:   . Days of Exercise per Week: Not on file  . Minutes of Exercise per Session: Not on file  Stress:   . Feeling of Stress : Not on file  Social Connections:   . Frequency of Communication with Friends and Family: Not on file  . Frequency of Social Gatherings with Friends and Family: Not on file  .  Attends Religious Services: Not on file  . Active Member of Clubs or Organizations: Not on file  . Attends Archivist Meetings: Not on file  . Marital Status: Not on file  Intimate Partner Violence:   . Fear of Current or Ex-Partner: Not on file  . Emotionally Abused: Not on file  . Physically Abused: Not on file  . Sexually Abused: Not on file    FAMILY HISTORY: Family History  Problem Relation Age of Onset  . Heart attack Father   . Heart attack Brother   . Parkinsonism Brother   . Cancer Brother   . Arrhythmia Brother     ALLERGIES:  is allergic to niacin, niacin and related, metformin and related, and statins.  MEDICATIONS:  Current Outpatient Medications  Medication Sig Dispense Refill  . acetaminophen (TYLENOL) 650 MG CR tablet Take 1,300 mg by mouth every 8 (eight) hours as needed for pain.     Marland Kitchen amLODipine (NORVASC) 2.5 MG tablet Take 1 tablet (2.5 mg total) by mouth daily. 90 tablet 3  . cyanocobalamin 1000 MCG tablet Take 1,000 mcg by mouth daily.     Marland Kitchen ezetimibe (ZETIA) 10 MG tablet TAKE 1 TABLET BY MOUTH EVERY DAY (Patient taking differently: Take 10 mg by mouth daily. ) 90 tablet 2  . furosemide (LASIX) 40 MG tablet Take 40 mg by mouth daily.     Marland Kitchen glimepiride (AMARYL) 2 MG tablet TAKE 1 TABLET (2 MG TOTAL) BY MOUTH DAILY WITH BREAKFAST (Patient taking differently: Take 2 mg by mouth daily with breakfast. ) 90 tablet 0  . hydrALAZINE (APRESOLINE) 25 MG tablet Take 1 tablet (25 mg total) by mouth 2 (two) times daily. 180 tablet 3  . loratadine (CLARITIN) 10 MG tablet Take 10 mg by mouth daily.    . pantoprazole (PROTONIX) 40 MG tablet Take 1 tablet (40 mg total) by mouth daily. 30 tablet 0  . Polyvinyl Alcohol-Povidone (REFRESH OP) Place 1 drop into both eyes daily as needed (for dry eyes).     . tamsulosin (FLOMAX) 0.4 MG CAPS capsule TAKE 1 CAPSULE BY MOUTH ONCE DAILY (Patient taking differently: Take 0.4 mg by mouth daily. ) 90 capsule 3   No current  facility-administered medications for this visit.      PHYSICAL EXAMINATION:   Vitals:   12/16/19 0832  BP: (!) 152/55  Pulse: 68  Resp: 18  Temp: 98 F (36.7 C)  SpO2: 97%   Filed Weights   12/16/19 0832  Weight: 276 lb (125.2 kg)    Physical Exam Constitutional:      Comments: Obese male patient in a wheelchair.  Accompanied by his wife.  Nasal cannula oxygen.  HENT:     Head: Normocephalic and  atraumatic.     Mouth/Throat:     Pharynx: No oropharyngeal exudate.  Eyes:     Pupils: Pupils are equal, round, and reactive to light.  Cardiovascular:     Rate and Rhythm: Normal rate and regular rhythm.  Pulmonary:     Effort: No respiratory distress.     Breath sounds: No wheezing.     Comments: Decreased air entry bilaterally the bases. Abdominal:     General: Bowel sounds are normal. There is no distension.     Palpations: Abdomen is soft. There is no mass.     Tenderness: There is no abdominal tenderness. There is no guarding or rebound.  Musculoskeletal:        General: No tenderness. Normal range of motion.     Cervical back: Normal range of motion and neck supple.     Right lower leg: Edema present.     Left lower leg: Edema present.  Skin:    General: Skin is warm.  Neurological:     Mental Status: He is alert and oriented to person, place, and time.  Psychiatric:        Mood and Affect: Affect normal.     LABORATORY DATA:  I have reviewed the data as listed Lab Results  Component Value Date   WBC 5.1 12/16/2019   HGB 9.9 (L) 12/16/2019   HCT 30.6 (L) 12/16/2019   MCV 101.0 (H) 12/16/2019   PLT 227 12/16/2019   Recent Labs    03/28/19 1416 03/28/19 1416 12/06/19 1630 12/07/19 1015 12/07/19 1844 12/08/19 0749 12/11/19 0228 12/12/19 0259 12/16/19 0757  NA 143   < > 141   < >  --    < > 138 139 141  K 5.3*   < > 4.4   < >  --    < > 3.9 4.1 3.8  CL 105   < > 98   < >  --    < > 99 100 99  CO2 22   < > 28   < >  --    < > _0 GLUCOSE 183*   < > 155*   < >  --    < > 147* 131* 121*  BUN 30*   < > 54*   < >  --    < > 42* 43* 31*  CREATININE 1.49*   < > 2.32*   < >  --    < > 2.19* 2.14* 1.60*  CALCIUM 9.0   < > 9.0   < >  --    < > 8.6* 8.6* 9.2  GFRNONAA 45*   < > 26*   < >  --    < > 30* 31* 44*  GFRAA 52*  --  30*  --   --   --   --   --   --   PROT  --   --   --   --  6.6   < > 5.8* 5.9* 7.0  ALBUMIN  --   --   --   --  3.8   < > 3.3* 3.4* 4.0  AST  --   --   --   --  15   < > _1 ALT  --   --   --   --  17   < > _2 ALKPHOS  --   --   --   --  127*   < > 103 110 125  BILITOT  --   --   --   --  2.3*   < > 2.2* 2.1* 2.6*  BILIDIR  --   --   --   --  0.7*  --   --   --   --   IBILI  --   --   --   --  1.6*  --   --   --   --    < > = values in this interval not displayed.     DG Chest 2 View  Result Date: 12/07/2019 CLINICAL DATA:  Shortness of breath. EXAM: CHEST - 2 VIEW COMPARISON:  03/29/2018 FINDINGS: 1016 hours. The cardio pericardial silhouette is enlarged. There is pulmonary vascular congestion without overt pulmonary edema. Diffuse interstitial opacity suggest component of underlying interstitial edema. Left single lead permanent pacemaker noted. Bones are demineralized. IMPRESSION: Enlargement of the cardiopericardial silhouette with pulmonary vascular congestion and probable interstitial edema. Electronically Signed   By: Misty Stanley M.D.   On: 12/07/2019 10:40   CT BONE MARROW BIOPSY & ASPIRATION  Result Date: 12/11/2019 CLINICAL DATA:  Anemia EXAM: CT GUIDED DEEP ILIAC BONE ASPIRATION AND CORE BIOPSY TECHNIQUE: Patient was placed prone on the CT gantry and limited axial scans through the pelvis were obtained. Appropriate skin entry site was identified. Skin site was marked, prepped with chlorhexidine, draped in usual sterile fashion, and infiltrated locally with 1% lidocaine. Intravenous Fentanyl 65mg and Versed 147mwere administered as conscious sedation during continuous  monitoring of the patient's level of consciousness and physiological / cardiorespiratory status by the radiology RN, with a total moderate sedation time of 20 minutes. Under CT fluoroscopic guidance an 11-gauge Cook trocar bone needle was advanced into the right iliac bone just lateral to the sacroiliac joint. Once needle tip position was confirmed, core and aspiration samples were obtained, submitted to pathology for approval. Post procedure scans show no hematoma or fracture. Patient tolerated procedure well. COMPLICATIONS: COMPLICATIONS none IMPRESSION: 1. Technically successful CT guided right iliac bone core and aspiration biopsy. Electronically Signed   By: D Lucrezia Europe.D.   On: 12/11/2019 09:52   ECHOCARDIOGRAM COMPLETE  Result Date: 12/08/2019    ECHOCARDIOGRAM REPORT   [facility]2019 Medical Rec #:  [id number]    Height:       72.0 in Accession #:    [id number]   Weight:       270.4 lb Date of Birth:  5/Aug 19, 1940    BSA:          2.422 m Patient Age:    [age]       BP:           135/63 mmHg Patient Gender: M              HR:           65 bpm. Exam Location:  Inpatient Procedure: 2D Echo, Cardiac Doppler and Color Doppler Indications:    CHF  History:        Patient has prior history of Echocardiogram examinations, most                 recent 08/27/2018. CHF, CAD, Pacemaker and Prior CABG, Stroke,                 Aortic Valve Disease and TAVR, Arrythmias:Atrial Fibrillation;  Risk Factors:Hypertension, Dyslipidemia and Diabetes.  Sonographer:    Dustin Flock Referring Phys: Kirkpatrick  1. Left ventricular ejection fraction, by estimation, is 45 to 50%. The left ventricle has mildly decreased function. The left ventricle demonstrates global hypokinesis. There is moderate concentric left ventricular hypertrophy. Left ventricular diastolic function could not be evaluated.  2. Right ventricular systolic function is  moderately reduced. The right ventricular size is moderately enlarged. There is severely elevated pulmonary artery systolic pressure. The estimated right ventricular systolic pressure is 56.2 mmHg.  3. Left atrial size was severely dilated.  4. Right atrial size was moderately dilated.  5. The mitral valve is degenerative. Mild mitral valve regurgitation. Moderate to severe mitral annular calcification.  6. The aortic valve has been repaired/replaced. Aortic valve regurgitation is not visualized. Procedure Date: 04/29/2017. Echo findings are consistent with normal structure and function of the aortic valve prosthesis. Aortic valve mean gradient measures  11.0 mmHg. Aortic valve Vmax measures 2.24 m/s.  7. The inferior vena cava is dilated in size with <50% respiratory variability, suggesting right atrial pressure of 15 mmHg. Comparison(s): Prior images reviewed side by side. The left ventricular function is worsened. The right ventricular systolic function is worse. Pulmonary artery systolic pressure and right atrial pressure are higher. TAVR gradients are improved (possibly  due to lower cardiac output). FINDINGS  Left Ventricle: Left ventricular ejection fraction, by estimation, is 45 to 50%. The left ventricle has mildly decreased function. The left ventricle demonstrates global hypokinesis. The left ventricular internal cavity size was normal in size. There is  moderate concentric left ventricular hypertrophy. Abnormal (paradoxical) septal motion, consistent with RV pacemaker and abnormal (paradoxical) septal motion consistent with post-operative status. Left ventricular diastolic function could not be evaluated due to atrial fibrillation. Left ventricular diastolic function could not be evaluated. Right Ventricle: The right ventricular size is moderately enlarged. Right vetricular wall thickness was not well visualized. Right ventricular systolic function is moderately reduced. There is severely elevated  pulmonary artery systolic pressure. The tricuspid regurgitant velocity is 3.68 m/s, and with an assumed right atrial pressure of 15 mmHg, the estimated right ventricular systolic pressure is 56.3 mmHg. Left Atrium: Left atrial size was severely dilated. Right Atrium: Right atrial size was moderately dilated. Pericardium: There is no evidence of pericardial effusion. Mitral Valve: The mitral valve is degenerative in appearance. Moderate to severe mitral annular calcification. Mild mitral valve regurgitation. Tricuspid Valve: The tricuspid valve is normal in structure. Tricuspid valve regurgitation is trivial. Aortic Valve: The aortic valve has been repaired/replaced. Aortic valve regurgitation is not visualized. Aortic valve mean gradient measures 11.0 mmHg. Aortic valve peak gradient measures 20.1 mmHg. Aortic valve area, by VTI measures 1.44 cm. 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 Echo findings are consistent with normal structure and function of the aortic valve prosthesis. Pulmonic Valve: The pulmonic valve was grossly normal. Pulmonic valve regurgitation is not visualized. Aorta: The aortic root is normal in size and structure. Venous: The inferior vena cava is dilated in size with less than 50% respiratory variability, suggesting right atrial pressure of 15 mmHg. IAS/Shunts: No atrial level shunt detected by color flow Doppler. Additional Comments: A pacer wire is visualized.  LEFT VENTRICLE PLAX 2D LVIDd:         5.50 cm  Diastology LVIDs:         4.70 cm  LV e' medial:    4.46 cm/s LV PW:  1.50 cm  LV E/e' medial:  31.2 LV IVS:        1.50 cm  LV e' lateral:   5.77 cm/s LVOT diam:     2.00 cm  LV E/e' lateral: 24.1 LV SV:         67 LV SV Index:   28 LVOT Area:     3.14 cm  RIGHT VENTRICLE RV Basal diam:  5.20 cm RV S prime:     4.46 cm/s TAPSE (M-mode): 3.2 cm LEFT ATRIUM             Index       RIGHT ATRIUM           Index LA diam:        5.10 cm 2.11  cm/m  RA Area:     25.40 cm LA Vol (A2C):   93.2 ml 38.48 ml/m RA Volume:   75.50 ml  31.17 ml/m LA Vol (A4C):   89.7 ml 37.04 ml/m LA Biplane Vol: 94.7 ml 39.10 ml/m  AORTIC VALVE AV Area (Vmax):    1.46 cm AV Area (Vmean):   1.28 cm AV Area (VTI):     1.44 cm AV Vmax:           224.00 cm/s AV Vmean:          158.000 cm/s AV VTI:            0.466 m AV Peak Grad:      20.1 mmHg AV Mean Grad:      11.0 mmHg LVOT Vmax:         104.00 cm/s LVOT Vmean:        64.500 cm/s LVOT VTI:          0.213 m LVOT/AV VTI ratio: 0.46  AORTA Ao Root diam: 3.50 cm MITRAL VALVE                TRICUSPID VALVE MV Area (PHT): 4.49 cm     TR Peak grad:   54.2 mmHg MV Decel Time: 169 msec     TR Vmax:        368.00 cm/s MV E velocity: 139.00 cm/s MV A velocity: 30.60 cm/s   SHUNTS MV E/A ratio:  4.54         Systemic VTI:  0.21 m                             Systemic Diam: 2.00 cm Dani Gobble Croitoru MD Electronically signed by Sanda Klein MD Signature Date/Time: 12/08/2019/3:50:51 PM    Final    US Abdomen Limited RUQ (LIVER/GB)  Result Date: 12/07/2019 CLINICAL DATA:  Cirrhosis EXAM: ULTRASOUND ABDOMEN LIMITED RIGHT UPPER QUADRANT COMPARISON:  April 09, 2017 FINDINGS: Gallbladder: No gallstones visualized. Mild diffuse gallbladder wall thickening the relatively decompressed gallbladder up to 5 mm. No sonographic Murphy sign noted by sonographer. Common bile duct: Diameter: 5 mm, within normal limits Liver: Mildly nodular contour of the liver. Liver echogenicity is heterogeneous. Small volume ascites. Portal vein is patent on color Doppler imaging with normal direction of blood flow towards the liver. Other: None. IMPRESSION: 1. 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 No focal hepatic lesion identified sonographically. 2. Mild diffuse gallbladder wall thickening is most likely due to volume status or hypoalbuminemia. Electronically Signed   By: Valentino Saxon MD   On: 12/07/2019 17:02     MDS (myelodysplastic syndrome), low grade (  [place]) # MDS: low grade [nadir hb-6.5- NOV 2021]; s/p bone marrow biopsy; cytogenetics pending.  Discussed treatment options mainly depend upon his cytogenetics.  Also recommend foundation heme.  However given his multiple comorbidities borderline performance status-hypomethylating agents might be prohibitive.  See below  # Severe anemia-multifactorial/MDS [as above]; CKD.  Today hemoglobin is 9.9.  Recommend start erythropoietin stimulating agent. Discussed use of erythropoietin stimulating agents like retacrit to stimulate the bone marrow.  Discussed the potential issues with erythropoietin estimating agents-given the risk of stroke thromboembolic events/elevated blood pressure.  However, most of the serious events did not happen when the goal hematocrit is 33/hemoglobin 30.  I also discussed the use of IV iron infusion; and the potential infusion reactions.  # CKD stage III [GFR ~46/creatinine 1.6]-multifactorial monitor closely on diuretics.  #CHF-noted to have significant weight gain/swelling in the legs-defer to cardiology regarding increasing Lasix appointment tomorrow.  # A. Fib-Eliquis currently on HOLD sec to severe anemia- [Dr.Jordan]-today hemoglobin is 9.7; given the absence of any active bleeding; reasonable to restart Eliquis; defer to cardiology.  I spoke at length with the patient's wife and daughter over the phone]- regarding the patient's clinical status/plan of care.  Family agreement.   #DISPOSITION: # 1 week- labs- erythorpoietin; H&H- RETACRIT # 2 weeks- labs- H&H- retacrit # 3 weeks-MD' labs- cbc/bmp-retacrit- Dr.B  Cc; [place]/Jordan  All questions were answered. The patient knows to call the clinic with any problems, questions or concerns.    Cammie Sickle, MD 12/17/2019 7:52 AM

## 2019-12-17 ENCOUNTER — Ambulatory Visit (INDEPENDENT_AMBULATORY_CARE_PROVIDER_SITE_OTHER): Payer: Medicare Other | Admitting: Cardiology

## 2019-12-17 ENCOUNTER — Encounter: Payer: Self-pay | Admitting: Cardiology

## 2019-12-17 VITALS — BP 136/61 | HR 67 | Ht 72.0 in | Wt 272.0 lb

## 2019-12-17 DIAGNOSIS — I25810 Atherosclerosis of coronary artery bypass graft(s) without angina pectoris: Secondary | ICD-10-CM | POA: Diagnosis not present

## 2019-12-17 DIAGNOSIS — I5043 Acute on chronic combined systolic (congestive) and diastolic (congestive) heart failure: Secondary | ICD-10-CM

## 2019-12-17 DIAGNOSIS — I4821 Permanent atrial fibrillation: Secondary | ICD-10-CM | POA: Diagnosis not present

## 2019-12-17 DIAGNOSIS — Z953 Presence of xenogenic heart valve: Secondary | ICD-10-CM

## 2019-12-17 DIAGNOSIS — I6523 Occlusion and stenosis of bilateral carotid arteries: Secondary | ICD-10-CM

## 2019-12-17 DIAGNOSIS — D469 Myelodysplastic syndrome, unspecified: Secondary | ICD-10-CM

## 2019-12-17 DIAGNOSIS — Z7189 Other specified counseling: Secondary | ICD-10-CM | POA: Insufficient documentation

## 2019-12-17 DIAGNOSIS — D619 Aplastic anemia, unspecified: Secondary | ICD-10-CM

## 2019-12-17 NOTE — Patient Instructions (Addendum)
Increase lasix to 80  mg twice a day for 3 days then 40 mg bid.   Follow up in 4 weeks.

## 2019-12-17 NOTE — Addendum Note (Signed)
Addended by: Debria Garret L on: 12/17/2019 02:00 PM   Modules accepted: Orders

## 2019-12-18 ENCOUNTER — Ambulatory Visit (INDEPENDENT_AMBULATORY_CARE_PROVIDER_SITE_OTHER): Payer: Medicare Other

## 2019-12-18 DIAGNOSIS — I4821 Permanent atrial fibrillation: Secondary | ICD-10-CM

## 2019-12-18 LAB — CUP PACEART REMOTE DEVICE CHECK
Battery Remaining Longevity: 137 mo
Battery Remaining Percentage: 95.5 %
Battery Voltage: 2.99 V
Brady Statistic RV Percent Paced: 97 %
Date Time Interrogation Session: 20211124070638
Implantable Lead Implant Date: 20190228
Implantable Lead Location: 753860
Implantable Pulse Generator Implant Date: 20190228
Lead Channel Impedance Value: 410 Ohm
Lead Channel Pacing Threshold Amplitude: 0.75 V
Lead Channel Pacing Threshold Pulse Width: 0.5 ms
Lead Channel Sensing Intrinsic Amplitude: 9.5 mV
Lead Channel Setting Pacing Amplitude: 1 V
Lead Channel Setting Pacing Pulse Width: 0.5 ms
Lead Channel Setting Sensing Sensitivity: 2 mV
Pulse Gen Model: 1272
Pulse Gen Serial Number: 8971140

## 2019-12-20 ENCOUNTER — Encounter (HOSPITAL_COMMUNITY): Payer: Self-pay | Admitting: Hematology

## 2019-12-23 ENCOUNTER — Inpatient Hospital Stay: Payer: Medicare Other

## 2019-12-23 ENCOUNTER — Other Ambulatory Visit: Payer: Self-pay

## 2019-12-23 VITALS — BP 145/69 | HR 67

## 2019-12-23 DIAGNOSIS — D4620 Refractory anemia with excess of blasts, unspecified: Secondary | ICD-10-CM

## 2019-12-23 DIAGNOSIS — D539 Nutritional anemia, unspecified: Secondary | ICD-10-CM | POA: Diagnosis not present

## 2019-12-23 LAB — HEMATOCRIT: HCT: 28 % — ABNORMAL LOW (ref 39.0–52.0)

## 2019-12-23 LAB — HEMOGLOBIN: Hemoglobin: 8.8 g/dL — ABNORMAL LOW (ref 13.0–17.0)

## 2019-12-23 MED ORDER — EPOETIN ALFA-EPBX 10000 UNIT/ML IJ SOLN
20000.0000 [IU] | Freq: Once | INTRAMUSCULAR | Status: AC
  Administered 2019-12-23: 20000 [IU] via SUBCUTANEOUS

## 2019-12-23 MED ORDER — EPOETIN ALFA-EPBX 40000 UNIT/ML IJ SOLN
20000.0000 [IU] | Freq: Once | INTRAMUSCULAR | Status: DC
  Filled 2019-12-23: qty 1

## 2019-12-24 ENCOUNTER — Encounter (HOSPITAL_COMMUNITY): Payer: Self-pay | Admitting: Hematology

## 2019-12-24 LAB — ERYTHROPOIETIN: Erythropoietin: 342.6 m[IU]/mL — ABNORMAL HIGH (ref 2.6–18.5)

## 2019-12-25 ENCOUNTER — Telehealth: Payer: Self-pay | Admitting: *Deleted

## 2019-12-25 NOTE — Telephone Encounter (Signed)
Dr. Sharin Grave clinic requested that the metb collected on 12/30/2019. You had the metb arranged for 12/13 visit

## 2019-12-25 NOTE — Telephone Encounter (Signed)
-----   Message from [place] sent at 12/25/2019  3:11 PM EST ----- Regarding: [place],   I just uploaded a [place] into this pts chart in Epic under the media tab.  Thanks!

## 2019-12-27 NOTE — Progress Notes (Signed)
Remote pacemaker transmission.   

## 2019-12-30 ENCOUNTER — Inpatient Hospital Stay: Payer: Medicare Other

## 2019-12-30 ENCOUNTER — Inpatient Hospital Stay: Payer: Medicare Other | Attending: Internal Medicine

## 2019-12-30 VITALS — BP 148/71 | HR 75

## 2019-12-30 DIAGNOSIS — Z79899 Other long term (current) drug therapy: Secondary | ICD-10-CM | POA: Insufficient documentation

## 2019-12-30 DIAGNOSIS — Z951 Presence of aortocoronary bypass graft: Secondary | ICD-10-CM | POA: Insufficient documentation

## 2019-12-30 DIAGNOSIS — Z7984 Long term (current) use of oral hypoglycemic drugs: Secondary | ICD-10-CM | POA: Diagnosis not present

## 2019-12-30 DIAGNOSIS — Z87891 Personal history of nicotine dependence: Secondary | ICD-10-CM | POA: Insufficient documentation

## 2019-12-30 DIAGNOSIS — Z950 Presence of cardiac pacemaker: Secondary | ICD-10-CM | POA: Insufficient documentation

## 2019-12-30 DIAGNOSIS — I2510 Atherosclerotic heart disease of native coronary artery without angina pectoris: Secondary | ICD-10-CM | POA: Insufficient documentation

## 2019-12-30 DIAGNOSIS — I110 Hypertensive heart disease with heart failure: Secondary | ICD-10-CM | POA: Insufficient documentation

## 2019-12-30 DIAGNOSIS — Z952 Presence of prosthetic heart valve: Secondary | ICD-10-CM | POA: Insufficient documentation

## 2019-12-30 DIAGNOSIS — R5383 Other fatigue: Secondary | ICD-10-CM | POA: Insufficient documentation

## 2019-12-30 DIAGNOSIS — I509 Heart failure, unspecified: Secondary | ICD-10-CM | POA: Diagnosis not present

## 2019-12-30 DIAGNOSIS — I4821 Permanent atrial fibrillation: Secondary | ICD-10-CM | POA: Insufficient documentation

## 2019-12-30 DIAGNOSIS — E119 Type 2 diabetes mellitus without complications: Secondary | ICD-10-CM | POA: Insufficient documentation

## 2019-12-30 DIAGNOSIS — Z8673 Personal history of transient ischemic attack (TIA), and cerebral infarction without residual deficits: Secondary | ICD-10-CM | POA: Insufficient documentation

## 2019-12-30 DIAGNOSIS — R0602 Shortness of breath: Secondary | ICD-10-CM | POA: Insufficient documentation

## 2019-12-30 DIAGNOSIS — D539 Nutritional anemia, unspecified: Secondary | ICD-10-CM | POA: Insufficient documentation

## 2019-12-30 DIAGNOSIS — Z8249 Family history of ischemic heart disease and other diseases of the circulatory system: Secondary | ICD-10-CM | POA: Diagnosis not present

## 2019-12-30 DIAGNOSIS — D4620 Refractory anemia with excess of blasts, unspecified: Secondary | ICD-10-CM

## 2019-12-30 DIAGNOSIS — E785 Hyperlipidemia, unspecified: Secondary | ICD-10-CM | POA: Diagnosis not present

## 2019-12-30 DIAGNOSIS — Z7901 Long term (current) use of anticoagulants: Secondary | ICD-10-CM | POA: Diagnosis not present

## 2019-12-30 LAB — HEMATOCRIT: HCT: 25.2 % — ABNORMAL LOW (ref 39.0–52.0)

## 2019-12-30 LAB — HEMOGLOBIN: Hemoglobin: 8.2 g/dL — ABNORMAL LOW (ref 13.0–17.0)

## 2019-12-30 LAB — BASIC METABOLIC PANEL
Anion gap: 13 (ref 5–15)
BUN: 35 mg/dL — ABNORMAL HIGH (ref 8–23)
CO2: 31 mmol/L (ref 22–32)
Calcium: 8.9 mg/dL (ref 8.9–10.3)
Chloride: 94 mmol/L — ABNORMAL LOW (ref 98–111)
Creatinine, Ser: 1.69 mg/dL — ABNORMAL HIGH (ref 0.61–1.24)
GFR, Estimated: 41 mL/min — ABNORMAL LOW (ref >60)
Glucose, Bld: 140 mg/dL — ABNORMAL HIGH (ref 70–99)
Potassium: 3.3 mmol/L — ABNORMAL LOW (ref 3.5–5.1)
Sodium: 138 mmol/L (ref 135–145)

## 2019-12-30 LAB — SURGICAL PATHOLOGY

## 2019-12-30 MED ORDER — EPOETIN ALFA-EPBX 10000 UNIT/ML IJ SOLN
10000.0000 [IU] | Freq: Once | INTRAMUSCULAR | Status: AC
  Administered 2019-12-30: 10000 [IU] via SUBCUTANEOUS

## 2019-12-30 MED ORDER — EPOETIN ALFA-EPBX 40000 UNIT/ML IJ SOLN
20000.0000 [IU] | Freq: Once | INTRAMUSCULAR | Status: DC
  Filled 2019-12-30: qty 1

## 2019-12-31 ENCOUNTER — Telehealth: Payer: Self-pay | Admitting: Internal Medicine

## 2019-12-31 MED ORDER — LENALIDOMIDE 5 MG PO CAPS: 5.0000 mg | ORAL_CAPSULE | Freq: Every day | ORAL | 0 refills | Status: DC

## 2019-12-31 NOTE — Telephone Encounter (Signed)
LOW RISK R-IPSS.  Recommend revlimid 5 mg/day [given renal failure GFR-40]. Will start new prescription. Will discuss with pt/wife at next visit.   GB

## 2020-01-01 ENCOUNTER — Telehealth: Payer: Self-pay | Admitting: Pharmacist

## 2020-01-01 ENCOUNTER — Telehealth: Payer: Self-pay | Admitting: *Deleted

## 2020-01-01 ENCOUNTER — Other Ambulatory Visit: Payer: Self-pay | Admitting: *Deleted

## 2020-01-01 ENCOUNTER — Telehealth: Payer: Self-pay | Admitting: Pharmacy Technician

## 2020-01-01 ENCOUNTER — Telehealth: Payer: Self-pay | Admitting: Cardiology

## 2020-01-01 DIAGNOSIS — D4620 Refractory anemia with excess of blasts, unspecified: Secondary | ICD-10-CM

## 2020-01-01 MED ORDER — LENALIDOMIDE 5 MG PO CAPS: 5.0000 mg | ORAL_CAPSULE | Freq: Every day | ORAL | 0 refills | Status: AC

## 2020-01-01 MED ORDER — LENALIDOMIDE 5 MG PO CAPS
5.0000 mg | ORAL_CAPSULE | Freq: Every day | ORAL | 0 refills | Status: DC
Start: 2020-01-01 — End: 2020-01-01

## 2020-01-01 NOTE — Telephone Encounter (Signed)
Oral Oncology Patient Advocate Encounter  Received notification from [facility] that prior authorization for Revlimid is required.  PA submitted on CoverMyMeds Key [id number]  Status is pending  Oral Oncology Clinic will continue to follow.  [place] Patient [place] Phone [phone] Fax [phone] 01/01/2020 11:55 AM

## 2020-01-01 NOTE — Telephone Encounter (Signed)
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  CMP from 12/16/19 assessed, SCr elevated at 1.6 mg/dL, CrCl ~49 mL/min. Prescription dose and frequency assessed. Dose adjusted for renal impairment.  Current medication list in Epic reviewed, no DDIs with lenalidomide identified.  Evaluated chart and no patient barriers to medication adherence identified.   Oral Oncology Clinic will continue to follow for insurance authorization, copayment issues, initial counseling and start date.  Darl Pikes, PharmD, BCPS, BCOP, CPP Hematology/Oncology Clinical Pharmacist Practitioner [facility]/HP/AP [facility] [place] [facility] [phone]  01/01/2020 8:30 AM

## 2020-01-01 NOTE — Telephone Encounter (Signed)
Patient is wanting to speak with Evan [name] in regards to a medication that the patient is trying to be put on. She was unsure of the name of it. Phone kept breaking up so I could barely understand Evan [name]. Please advise.

## 2020-01-01 NOTE — Telephone Encounter (Signed)
Spoke with patient's wife re: the REMS program. Patient electronically enrolled in the REMS program via telephone.

## 2020-01-01 NOTE — Telephone Encounter (Signed)
Spoke with wife and oncologist wants him to start Revlimid once daily, would like Dr Doug Sou input on taking Will forward to Dr [name] for review

## 2020-01-01 NOTE — Telephone Encounter (Signed)
Oral Oncology Patient Advocate Encounter  Prior Authorization for Revlimid has been approved.    PA# [id number]  Effective dates: 01/01/20- until further notice  Patients co-pay is $2224.34.   Oral Oncology Clinic will continue to follow.   [place] Patient [place] Phone [phone] Fax [phone] 01/01/2020 11:56 AM

## 2020-01-02 NOTE — Telephone Encounter (Signed)
Yes I agree with oncology recommendations  Jamecia Lerman [name] MD, [facility]

## 2020-01-02 NOTE — Telephone Encounter (Signed)
Returned call to patient's wife left Dr.Jordan's advice on personal voice mail.

## 2020-01-03 ENCOUNTER — Other Ambulatory Visit: Payer: Self-pay | Admitting: *Deleted

## 2020-01-03 DIAGNOSIS — D4620 Refractory anemia with excess of blasts, unspecified: Secondary | ICD-10-CM

## 2020-01-06 ENCOUNTER — Inpatient Hospital Stay: Payer: Medicare Other | Admitting: Pharmacist

## 2020-01-06 ENCOUNTER — Other Ambulatory Visit: Payer: Self-pay

## 2020-01-06 ENCOUNTER — Telehealth: Payer: Self-pay | Admitting: Pharmacy Technician

## 2020-01-06 ENCOUNTER — Inpatient Hospital Stay (HOSPITAL_BASED_OUTPATIENT_CLINIC_OR_DEPARTMENT_OTHER): Payer: Medicare Other | Admitting: Internal Medicine

## 2020-01-06 ENCOUNTER — Inpatient Hospital Stay: Payer: Medicare Other

## 2020-01-06 DIAGNOSIS — I6523 Occlusion and stenosis of bilateral carotid arteries: Secondary | ICD-10-CM | POA: Diagnosis not present

## 2020-01-06 DIAGNOSIS — D4620 Refractory anemia with excess of blasts, unspecified: Secondary | ICD-10-CM

## 2020-01-06 DIAGNOSIS — D539 Nutritional anemia, unspecified: Secondary | ICD-10-CM | POA: Diagnosis not present

## 2020-01-06 LAB — BASIC METABOLIC PANEL
Anion gap: 11 (ref 5–15)
BUN: 31 mg/dL — ABNORMAL HIGH (ref 8–23)
CO2: 29 mmol/L (ref 22–32)
Calcium: 8.8 mg/dL — ABNORMAL LOW (ref 8.9–10.3)
Chloride: 96 mmol/L — ABNORMAL LOW (ref 98–111)
Creatinine, Ser: 1.59 mg/dL — ABNORMAL HIGH (ref 0.61–1.24)
GFR, Estimated: 44 mL/min — ABNORMAL LOW (ref >60)
Glucose, Bld: 147 mg/dL — ABNORMAL HIGH (ref 70–99)
Potassium: 3.4 mmol/L — ABNORMAL LOW (ref 3.5–5.1)
Sodium: 136 mmol/L (ref 135–145)

## 2020-01-06 LAB — CBC WITH DIFFERENTIAL/PLATELET
Abs Immature Granulocytes: 0 10*3/uL (ref 0.00–0.07)
Band Neutrophils: 8 %
Basophils Absolute: 0 10*3/uL (ref 0.0–0.1)
Basophils Relative: 0 %
Eosinophils Absolute: 0.4 10*3/uL (ref 0.0–0.5)
Eosinophils Relative: 7 %
HCT: 24.5 % — ABNORMAL LOW (ref 39.0–52.0)
Hemoglobin: 7.9 g/dL — ABNORMAL LOW (ref 13.0–17.0)
Lymphocytes Relative: 9 %
Lymphs Abs: 0.5 10*3/uL — ABNORMAL LOW (ref 0.7–4.0)
MCH: 32.2 pg (ref 26.0–34.0)
MCHC: 32.2 g/dL (ref 30.0–36.0)
MCV: 100 fL (ref 80.0–100.0)
Monocytes Absolute: 0.2 10*3/uL (ref 0.1–1.0)
Monocytes Relative: 3 %
Neutro Abs: 4.2 10*3/uL (ref 1.7–7.7)
Neutrophils Relative %: 73 %
Platelets: 286 10*3/uL (ref 150–400)
RBC: 2.45 MIL/uL — ABNORMAL LOW (ref 4.22–5.81)
RDW: 22 % — ABNORMAL HIGH (ref 11.5–15.5)
Smear Review: ADEQUATE
WBC: 5.2 10*3/uL (ref 4.0–10.5)
nRBC: 0 % (ref 0.0–0.2)

## 2020-01-06 LAB — SAMPLE TO BLOOD BANK

## 2020-01-06 MED ORDER — EPOETIN ALFA-EPBX 10000 UNIT/ML IJ SOLN
20000.0000 [IU] | Freq: Once | INTRAMUSCULAR | Status: AC
  Administered 2020-01-06: 12:00:00 20000 [IU] via SUBCUTANEOUS
  Filled 2020-01-06: qty 2

## 2020-01-06 MED ORDER — PROCHLORPERAZINE MALEATE 10 MG PO TABS: 10.0000 mg | ORAL_TABLET | Freq: Four times a day (QID) | ORAL | 1 refills | Status: AC | PRN

## 2020-01-06 MED ORDER — ONDANSETRON HCL 8 MG PO TABS: ORAL_TABLET | ORAL | 1 refills | Status: DC

## 2020-01-06 NOTE — Assessment & Plan Note (Addendum)
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 deletion  #Continue Retacrit as hemoglobin today is 7.9.  However I would recommend addition of Revlimid 5 mg daily [given CKD stage III]-which is usually in very effective treatment especially for 5 q. Deletion MDS. Discussed the potential side effects of Revlimid including but not limited to diarrhea skin rash thromboembolic events.  Recommend going on [facility] cardiology evaluation this week].  Also discussed the potential teratogenic side effects; and also enrolled in REMs program.  Patient will get started whenever Revlimid is available. Discussed with Blima Ledger, pharmacy.  # CKD stage III [GFR ~46/creatinine 1.59]-44 multifactorial monitor closely on diuretics.  #CHF-noted to have significant weight gain/swelling in the legs-defer to cardiology regarding titration of diuretics.  # A. Fib-Eliquis currently on HOLD sec to severe anemia- [Dr.Jordan]-today hemoglobin is 7.9; given the absence of any active bleeding; reasonable to restart Eliquis; defer to cardiology [appt on 12/15].    #DISPOSITION: # RETACRIT today # 1 week- labs- H&H  retacrit # 2 weeks- labs- H&H- retacrit # 3 weeks-X MD' labs- cbc/bmp-retacrit- # 4 week- labs- H&H  retacrit # 5 weeks- labs- H&H- retacrit  # 6 weeks-# 3 weeks- MD labs- cbc/bmp-retacrit- Dr.B  Addendum: I spoke to patient's daughter [phone] regarding the use of Revlimid which is highly effective treatment for 5 q. deletion MDS.  Treatment of his anemia showed help his fatigue/especially context of his CKD/CHF COPD-chronic respiratory failure.  In spite of the potential side effects of Revlimid-patient should small dose; and hopefully then initially improved.  Cc; Ola Spurr; [name].

## 2020-01-06 NOTE — Telephone Encounter (Signed)
Oral Oncology Patient Advocate Encounter  Was successful in securing patient a $10,000 grant from [facility] to provide copayment coverage for Revlimid.  This will keep the out of pocket expense at $0.     [employer] ID: [id number]  I have spoken with the patient.   The billing information is as follows and has been shared with [employer].    RxBin: [id number] PCN: [id number] Member ID: [id number] Group ID: [id number] Dates of Eligibility: 12/07/19 through 12/05/20  Fund:  [place] Patient Evan [name] Phone [phone] Fax [phone] 01/06/2020 11:52 AM

## 2020-01-06 NOTE — Progress Notes (Signed)
[place]  Telephone:([phone] [phone]  Patient Care Team: Leonel Ramsay, MD as PCP - General (Infectious Diseases) [name], [name] M, MD as PCP - Cardiology (Cardiology) Constance Haw, MD as PCP - Electrophysiology (Cardiology) Cammie Sickle, MD as Consulting Physician (Hematology and Oncology)   Name of the patient: Evan [name]  [id number]  [birth year]   Date of visit: 01/06/20  HPI: Patient is a 79 y.o. male with Low-risk MDS. MD plans to start the patient on Revlimid (lenalidomide)  Reason for Consult: Lenalidomide oral chemotherapy education.   PAST MEDICAL HISTORY: Past Medical History:  Diagnosis Date  . Anemia   . Carotid arterial disease ([place])   . CHF (congestive heart failure) ([place])   . Coronary artery disease   . Hearing loss   . HOH (hard of hearing)   . Hyperlipidemia   . Hypertension   . Nocturia   . Obesity   . Osteoarthritis    "BUE; BLE; right knee" (03/23/2017)  . Peripheral vision loss, bilateral    S/P 07/2015  . Permanent atrial fibrillation ([place])   . 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 Severe aortic stenosis   . Sinus drainage   . Stroke [facility]) 07/2015   "peripheral vision still bad out of left eye" (03/23/2017)  . Torn rotator cuff 2012   Right arm  . Type II diabetes mellitus ([place])   . Urinary frequency     PAST SURGICAL HISTORY:  Past Surgical History:  Procedure Laterality Date  . BACK SURGERY    . CARDIAC CATHETERIZATION  11/29/2005   SEVERE THREE VESSEL OBSTRUCTIVE ATHERSCLEROTIC CAD. ALL GRAFTS WERE PATENT. EF 65%  . CARDIAC CATHETERIZATION  03/23/2017  . CAROTID ENDARTERECTOMY Right 01-29-08   cea  . CARPAL TUNNEL RELEASE Right   . COLONOSCOPY N/A 12/10/2019   Procedure: COLONOSCOPY;  Surgeon: Wilford Corner, MD;  Location: [facility] ENDOSCOPY;  Service: Endoscopy;  Laterality: N/A;  . CORONARY ARTERY BYPASS GRAFT  03/2003   X3. LIMA  GRAFT TO THE LAD, SAPHENOUS VEIN GRAFT TO THE PA, AND A LEFT RADIAL GRAFT TO THEOBTUSE MARGINAL VESSEL  . ENDARTERECTOMY Left 05/28/2012   Procedure: ENDARTERECTOMY CAROTID;  Surgeon: Elam Dutch, MD;  Location: [place];  Service: Vascular;  Laterality: Left;  . ESOPHAGOGASTRODUODENOSCOPY N/A 12/10/2019   Procedure: ESOPHAGOGASTRODUODENOSCOPY (EGD);  Surgeon: Wilford Corner, MD;  Location: [place];  Service: Endoscopy;  Laterality: N/A;  . HEMOSTASIS CLIP PLACEMENT  12/10/2019   Procedure: HEMOSTASIS CLIP PLACEMENT;  Surgeon: Wilford Corner, MD;  Location: [place] ENDOSCOPY;  Service: Endoscopy;;  . INSERT / REPLACE / REMOVE PACEMAKER  03/23/2017  . [place]; 1984  . MOLE SURGERY     "? face/arms"  . PACEMAKER IMPLANT N/A 03/23/2017   Procedure: PACEMAKER IMPLANT;  Surgeon: Constance Haw, MD;  Location: [place] CV LAB;  Service: Cardiovascular;  Laterality: N/A;  . PATCH ANGIOPLASTY Left 05/28/2012   Procedure: WITH dACRON PATCH ANGIOPLASTY;  Surgeon: Elam Dutch, MD;  Location: [place];  Service: Vascular;  Laterality: Left;  . POLYPECTOMY  12/10/2019   Procedure: POLYPECTOMY;  Surgeon: Wilford Corner, MD;  Location: [facility] ENDOSCOPY;  Service: Endoscopy;;  . RIGHT/LEFT HEART CATH AND CORONARY/GRAFT ANGIOGRAPHY N/A 03/23/2017   Procedure: RIGHT/LEFT HEART CATH AND CORONARY/GRAFT ANGIOGRAPHY;  Surgeon: Sherren Mocha, MD;  Location: [place] CV LAB;  Service: Cardiovascular;  Laterality: N/A;  . TEE WITHOUT CARDIOVERSION N/A 05/09/2017   Procedure: TRANSESOPHAGEAL  ECHOCARDIOGRAM (TEE);  Surgeon: Sherren Mocha, MD;  Location: [place];  Service: Open Heart Surgery;  Laterality: N/A;  . TRANSCATHETER AORTIC VALVE REPLACEMENT, TRANSFEMORAL N/A 05/09/2017   Procedure: TRANSCATHETER AORTIC VALVE REPLACEMENT, TRANSFEMORAL using an Edwards 81m Sapien 3 Aortic Valve;  Surgeon: CSherren Mocha MD;  Location: [place]  Service: Open Heart Surgery;  Laterality: N/A;     HEMATOLOGY/ONCOLOGY HISTORY:  Oncology History Overview Note  # NOV 2021-LOW GRADE MDS- Macrocytic ANEMIA [since 2016]- no colo; CT scan march 2019-cirrhosis/ no splenomegaly [no alcohol]; NOV 2021- Hospitalization-  [Bone marrow Nov 2021- [place]/Dr.Feng]-The bone marrow is hypercellular for age with dyspoietic changes primarily involving the megakaryocytic cell line with many small and/or hypolobated forms.  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  MEDICATIONS:  Current Outpatient Medications  Medication Sig Dispense Refill  . acetaminophen (TYLENOL) 650 MG CR tablet Take 1,300 mg by mouth every 8 (eight) hours as needed for pain.     .Marland KitchenamLODipine (NORVASC) 2.5 MG tablet Take 1 tablet (2.5 mg total) by mouth daily. 90 tablet 3  . cyanocobalamin 1000 MCG tablet Take 1,000 mcg by mouth daily.     .Marland Kitchenezetimibe (ZETIA) 10 MG tablet TAKE 1 TABLET BY MOUTH EVERY DAY (Patient taking differently: Take 10 mg by mouth daily.) 90 tablet 2  . furosemide (LASIX) 40 MG tablet Take 40 mg by mouth 2 (two) times daily.    .Marland Kitchenglimepiride (AMARYL) 2 MG tablet TAKE 1 TABLET (2 MG TOTAL) BY MOUTH DAILY WITH BREAKFAST (Patient taking differently: Take 2 mg by mouth daily with breakfast.) 90 tablet 0  . hydrALAZINE (APRESOLINE) 25 MG tablet Take 1 tablet (25 mg total) by mouth 2 (two) times daily. 180 tablet 3  . lenalidomide (REVLIMID) 5 MG  capsule Take 1 capsule (5 mg total) by mouth daily. [employer] Auth # [id number]    Date Obtained 01/01/2020 (Patient not taking: Reported on 01/06/2020) 28 capsule 0  . loratadine (CLARITIN) 10 MG tablet Take 10 mg by mouth daily.    . ondansetron (ZOFRAN) 8 MG tablet One pill every 8 hours as needed for nausea/vomitting. 40 tablet 1  . pantoprazole (PROTONIX) 40 MG tablet Take 1 tablet (40 mg total) by mouth daily. 30 tablet 0  . Polyvinyl Alcohol-Povidone (REFRESH OP) Place 1 drop into both eyes daily as needed (for dry eyes).     . prochlorperazine (COMPAZINE) 10 MG tablet Take 1 tablet (10 mg total) by mouth every 6 (six) hours as needed for nausea or vomiting. 40 tablet 1  . tamsulosin (FLOMAX) 0.4 MG CAPS capsule TAKE 1 CAPSULE BY MOUTH ONCE DAILY (Patient taking differently: Take 0.4 mg by mouth daily.) 90 capsule 3   No current facility-administered medications for this visit.    VITAL SIGNS: There were no vitals taken for this visit. There were no vitals filed for this visit.  Estimated body mass index is 35.91 kg/m as calculated from the following:  Height as of 12/17/19: 6' (1.829 m).   Weight as of an earlier encounter on 01/06/20: 120.1 kg (264 lb 12.8 oz).  LABS: CBC:    Component Value Date/Time   WBC 5.2 01/06/2020 1008   HGB 7.9 (L) 01/06/2020 1008   HGB 6.2 (LL) 12/06/2019 1630   HCT 24.5 (L) 01/06/2020 1008   HCT 19.6 (L) 12/06/2019 1630   PLT 286 01/06/2020 1008   PLT 287 12/06/2019 1630   MCV 100.0 01/06/2020 1008   MCV 107 (H) 12/06/2019 1630   NEUTROABS 4.2 01/06/2020 1008   NEUTROABS 3.9 03/02/2018 1205   LYMPHSABS 0.5 (L) 01/06/2020 1008   LYMPHSABS 1.0 03/02/2018 1205   MONOABS 0.2 01/06/2020 1008   EOSABS 0.4 01/06/2020 1008   EOSABS 0.2 03/02/2018 1205   BASOSABS 0.0 01/06/2020 1008   BASOSABS 0.0 03/02/2018 1205   Comprehensive Metabolic Panel:    Component Value Date/Time   NA 136 01/06/2020 1008   NA 141 12/06/2019 1630   K 3.4 (L) 01/06/2020  1008   CL 96 (L) 01/06/2020 1008   CO2 29 01/06/2020 1008   BUN 31 (H) 01/06/2020 1008   BUN 54 (H) 12/06/2019 1630   CREATININE 1.59 (H) 01/06/2020 1008   CREATININE 0.96 12/11/2015 0854   GLUCOSE 147 (H) 01/06/2020 1008   CALCIUM 8.8 (L) 01/06/2020 1008   AST 22 12/16/2019 0757   ALT 25 12/16/2019 0757   ALKPHOS 125 12/16/2019 0757   BILITOT 2.6 (H) 12/16/2019 0757   BILITOT 0.7 08/06/2018 1159   PROT 7.0 12/16/2019 0757   PROT 7.0 08/06/2018 1159   ALBUMIN 4.0 12/16/2019 0757   ALBUMIN 4.6 08/06/2018 1159    RADIOGRAPHIC STUDIES: CT BONE MARROW BIOPSY & ASPIRATION  Result Date: 12/11/2019 CLINICAL DATA:  Anemia EXAM: CT GUIDED DEEP ILIAC BONE ASPIRATION AND CORE BIOPSY TECHNIQUE: Patient was placed prone on the CT gantry and limited axial scans through the pelvis were obtained. Appropriate skin entry site was identified. Skin site was marked, prepped with chlorhexidine, draped in usual sterile fashion, and infiltrated locally with 1% lidocaine. Intravenous Fentanyl 74mg and Versed 123mwere administered as conscious sedation during continuous monitoring of the patient's level of consciousness and physiological / cardiorespiratory status by the radiology RN, with a total moderate sedation time of 20 minutes. Under CT fluoroscopic guidance an 11-gauge Cook trocar bone needle was advanced into the right iliac bone just lateral to the sacroiliac joint. Once needle tip position was confirmed, core and aspiration samples were obtained, submitted to pathology for approval. Post procedure scans show no hematoma or fracture. Patient tolerated procedure well. COMPLICATIONS: COMPLICATIONS none IMPRESSION: 1. Technically successful CT guided right iliac bone core and aspiration biopsy. Electronically Signed   By: D Lucrezia Europe.D.   On: 12/11/2019 09:52   ECHOCARDIOGRAM COMPLETE  Result Date: 12/08/2019    ECHOCARDIOGRAM REPORT   [facility]   [facility] Exam: 12/08/2019 Medical Rec  #:  [id number]    Height:       72.0 in Accession #:    [id number]   Weight:       270.4 lb Date of Birth:  06/01/27/1942    BSA:          2.422 m Patient Age:    [age]       BP:           135/63 mmHg Patient Gender: M  HR:           65 bpm. Exam Location:  Inpatient Procedure: 2D Echo, Cardiac Doppler and Color Doppler Indications:    CHF  History:        Patient has prior history of Echocardiogram examinations, most                 recent 08/27/2018. CHF, CAD, Pacemaker and Prior CABG, Stroke,                 Aortic Valve Disease and TAVR, Arrythmias:Atrial Fibrillation;                 Risk Factors:Hypertension, Dyslipidemia and Diabetes.  Sonographer:    Dustin Flock Referring Phys: [place]  1. Left ventricular ejection fraction, by estimation, is 45 to 50%. The left ventricle has mildly decreased function. The left ventricle demonstrates global hypokinesis. There is moderate concentric left ventricular hypertrophy. Left ventricular diastolic function could not be evaluated.  2. Right ventricular systolic function is moderately reduced. The right ventricular size is moderately enlarged. There is severely elevated pulmonary artery systolic pressure. The estimated right ventricular systolic pressure is 51.8 mmHg.  3. Left atrial size was severely dilated.  4. Right atrial size was moderately dilated.  5. The mitral valve is degenerative. Mild mitral valve regurgitation. Moderate to severe mitral annular calcification.  6. The aortic valve has been repaired/replaced. Aortic valve regurgitation is not visualized. Procedure Date: 04/29/2017. Echo findings are consistent with normal structure and function of the aortic valve prosthesis. Aortic valve mean gradient measures  11.0 mmHg. Aortic valve Vmax measures 2.24 m/s.  7. The inferior vena cava is dilated in size with <50% respiratory variability, suggesting right atrial pressure of 15 mmHg. Comparison(s): Prior images  reviewed side by side. The left ventricular function is worsened. The right ventricular systolic function is worse. Pulmonary artery systolic pressure and right atrial pressure are higher. TAVR gradients are improved (possibly  due to lower cardiac output). FINDINGS  Left Ventricle: Left ventricular ejection fraction, by estimation, is 45 to 50%. The left ventricle has mildly decreased function. The left ventricle demonstrates global hypokinesis. The left ventricular internal cavity size was normal in size. There is  moderate concentric left ventricular hypertrophy. Abnormal (paradoxical) septal motion, consistent with RV pacemaker and abnormal (paradoxical) septal motion consistent with post-operative status. Left ventricular diastolic function could not be evaluated due to atrial fibrillation. Left ventricular diastolic function could not be evaluated. Right Ventricle: The right ventricular size is moderately enlarged. Right vetricular wall thickness was not well visualized. Right ventricular systolic function is moderately reduced. There is severely elevated pulmonary artery systolic pressure. The tricuspid regurgitant velocity is 3.68 m/s, and with an assumed right atrial pressure of 15 mmHg, the estimated right ventricular systolic pressure is 84.1 mmHg. Left Atrium: Left atrial size was severely dilated. Right Atrium: Right atrial size was moderately dilated. Pericardium: There is no evidence of pericardial effusion. Mitral Valve: The mitral valve is degenerative in appearance. Moderate to severe mitral annular calcification. Mild mitral valve regurgitation. Tricuspid Valve: The tricuspid valve is normal in structure. Tricuspid valve regurgitation is trivial. Aortic Valve: The aortic valve has been repaired/replaced. Aortic valve regurgitation is not visualized. Aortic valve mean gradient measures 11.0 mmHg. Aortic valve peak gradient measures 20.1 mmHg. Aortic valve area, by VTI measures 1.44 cm. There is a   Sapien prosthetic, stented (TAVR) valve present in the aortic position. Echo findings are consistent with normal structure and  function of the aortic valve prosthesis. Pulmonic Valve: The pulmonic valve was grossly normal. Pulmonic valve regurgitation is not visualized. Aorta: The aortic root is normal in size and structure. Venous: The inferior vena cava is dilated in size with less than 50% respiratory variability, suggesting right atrial pressure of 15 mmHg. IAS/Shunts: No atrial level shunt detected by color flow Doppler. Additional Comments: A pacer wire is visualized.  LEFT VENTRICLE PLAX 2D LVIDd:         5.50 cm  Diastology LVIDs:         4.70 cm  LV e' medial:    4.46 cm/s LV PW:         1.50 cm  LV E/e' medial:  31.2 LV IVS:        1.50 cm  LV e' lateral:   5.77 cm/s LVOT diam:     2.00 cm  LV E/e' lateral: 24.1 LV SV:         67 LV SV Index:   28 LVOT Area:     3.14 cm  RIGHT VENTRICLE RV Basal diam:  5.20 cm RV S prime:     4.46 cm/s TAPSE (M-mode): 3.2 cm LEFT ATRIUM             Index       RIGHT ATRIUM           Index LA diam:        5.10 cm 2.11 cm/m  RA Area:     25.40 cm LA Vol (A2C):   93.2 ml 38.48 ml/m RA Volume:   75.50 ml  31.17 ml/m LA Vol (A4C):   89.7 ml 37.04 ml/m LA Biplane Vol: 94.7 ml 39.10 ml/m  AORTIC VALVE AV Area (Vmax):    1.46 cm AV Area (Vmean):   1.28 cm AV Area (VTI):     1.44 cm AV Vmax:           224.00 cm/s AV Vmean:          158.000 cm/s AV VTI:            0.466 m AV Peak Grad:      20.1 mmHg AV Mean Grad:      11.0 mmHg LVOT Vmax:         104.00 cm/s LVOT Vmean:        64.500 cm/s LVOT VTI:          0.213 m LVOT/AV VTI ratio: 0.46  AORTA Ao Root diam: 3.50 cm MITRAL VALVE                TRICUSPID VALVE MV Area (PHT): 4.49 cm     TR Peak grad:   54.2 mmHg MV Decel Time: 169 msec     TR Vmax:        368.00 cm/s MV E velocity: 139.00 cm/s MV A velocity: 30.60 cm/s   SHUNTS MV E/A ratio:  4.54         Systemic VTI:  0.21 m                             Systemic Diam:  2.00 cm Dani Gobble Croitoru MD Electronically signed by Sanda Klein MD Signature Date/Time: 12/08/2019/3:50:51 PM    Final    CUP PACEART REMOTE DEVICE CHECK  Result Date: 12/18/2019 Scheduled remote reviewed. Normal device function.  Next remote 91 days. HB  US Abdomen Limited RUQ (LIVER/GB)  Result Date: 12/07/2019  CLINICAL DATA:  Cirrhosis EXAM: ULTRASOUND ABDOMEN LIMITED RIGHT UPPER QUADRANT COMPARISON:  April 09, 2017 FINDINGS: Gallbladder: No gallstones visualized. Mild diffuse gallbladder wall thickening the relatively decompressed gallbladder up to 5 mm. No sonographic Murphy sign noted by sonographer. Common bile duct: Diameter: 5 mm, within normal limits Liver: Mildly nodular contour of the liver. Liver echogenicity is heterogeneous. Small volume ascites. Portal vein is patent on color Doppler imaging with normal direction of blood flow towards the liver. Other: None. IMPRESSION: 1. 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 No focal hepatic lesion identified sonographically. 2. Mild diffuse gallbladder wall thickening is most likely due to volume status or hypoalbuminemia. Electronically Signed   By: Valentino Saxon MD   On: 12/07/2019 17:02     Assessment and Plan-  Patient is to get started as soon has he has medication in hand, that should be this week.   Patient Education I spoke with patient and his wife for overview of new oral chemotherapy medication: Revlimid (lenalidomide) for the treatment of MDS low risk, planned duration until disease progression or unacceptable drug toxicity.   Counseled patient on administration, dosing, side effects, monitoring, drug-food interactions, safe handling, storage, and disposal. Patient will take 1 capsule (5 mg total) by mouth daily.  Side effects include but not limited to: rash, constipation or diarrhea, N/V, fatigue, decreased wbc/plt/hgb.    Reviewed with patient importance of keeping a medication  schedule and plan for any missed doses.  After discussion with patient no patient barriers to medication adherence identified.   Evan [name] voiced understanding and appreciation. All questions answered. Medication handout provided.  Provided patient with [facility] [place] [facility] phone number. Patient knows to call the office with questions or concerns. Oral Chemotherapy Navigation Clinic will continue to follow.  Medication Access Issues: Income information obtained during visit today. Bethena Roys was able to sign Evan [name] up for a [employer] grant to cover his copay. Information given to [facility]. Patient knows to expect a call from [facility] to set-up medication delivery.   Patient expressed understanding and was in agreement with this plan. He also understands that He can call clinic at any time with any questions, concerns, or complaints.   Thank you for allowing me to participate in the care of this very pleasant patient.   Time Total: 25 mins  Visit consisted of counseling and education on dealing with issues of symptom management in the setting of serious and potentially life-threatening illness.Greater than 50%  of this time was spent counseling and coordinating care related to the above assessment and plan.  Signed by: Darl Pikes, PharmD, BCPS, Salley Slaughter, CPP Hematology/Oncology Clinical Pharmacist Practitioner [facility]/HP/AP [facility] [place] [facility] [phone]  01/06/2020 1:49 PM

## 2020-01-06 NOTE — Progress Notes (Signed)
Patient here for follow up after hospital stay and to start Revlimid.

## 2020-01-06 NOTE — Progress Notes (Signed)
[facility] CONSULT NOTE  Patient Care Team: Mick Sell, MD as PCP - General (Infectious Diseases) [place], [name] M, MD as PCP - Cardiology (Cardiology) Regan Lemming, MD as PCP - Electrophysiology (Cardiology) Earna Coder, MD as Consulting Physician (Hematology and Oncology)  CHIEF COMPLAINTS/PURPOSE OF CONSULTATION: MDS  HEMATOLOGY HISTORY  Oncology History Overview Note  # NOV 2021-LOW GRADE MDS- Macrocytic ANEMIA [since 2016]- no colo; CT scan march 2019-cirrhosis/ no splenomegaly [no alcohol]; NOV 2021- Hospitalization-  [Bone marrow Nov 2021- [place]/Dr.Feng]-The bone marrow is hypercellular for age with dyspoietic changes primarily involving the megakaryocytic cell line with many small and/or hypolobated forms.  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  Patient is currently on Retacrit.  Patient denies any significant improvement of his symptoms.  Denies any pain.  Complains of chronic shortness of breath.  Chronic swelling in the legs.  He continues to be on 2 L of nasal cannula oxygen.  Complains swelling in the legs.  Complains of back pain  Review of Systems  Constitutional: Positive for malaise/fatigue. Negative for chills,  diaphoresis, fever and weight loss.  HENT: Negative for nosebleeds and sore throat.   Eyes: Negative for double vision.  Respiratory: Positive for shortness of breath. Negative for cough, hemoptysis, sputum production and wheezing.   Cardiovascular: Positive for leg swelling. Negative for chest pain, palpitations and orthopnea.  Gastrointestinal: Negative for abdominal pain, blood in stool, constipation, diarrhea, heartburn, melena, nausea and vomiting.  Genitourinary: Negative for dysuria and urgency.  Musculoskeletal: Positive for back pain and joint pain.  Skin: Negative.  Negative for itching and rash.  Neurological: Negative for dizziness, tingling, focal weakness, weakness and headaches.  Endo/Heme/Allergies: Does not bruise/bleed easily.  Psychiatric/Behavioral: Negative for depression. The patient is not nervous/anxious and does not have insomnia.     MEDICAL HISTORY:  Past Medical History:  Diagnosis Date  . Anemia   . Carotid arterial disease (HCC)   . CHF (congestive heart failure) (HCC)   . Coronary artery disease   . Hearing loss   . HOH (hard of hearing)   . Hyperlipidemia   . Hypertension   . Nocturia   . Obesity   . Osteoarthritis    "BUE; BLE; right knee" (03/23/2017)  . Peripheral vision loss, bilateral    S/P 07/2015  . Permanent atrial fibrillation (HCC)   . 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 Severe aortic stenosis   . Sinus drainage   . Stroke [facility]) 07/2015   "peripheral vision still bad out of left eye" (03/23/2017)  . Torn rotator cuff 2012   Right arm  . Type II diabetes mellitus (HCC)   . Urinary frequency     SURGICAL HISTORY: Past Surgical History:  Procedure Laterality Date  . BACK SURGERY    . CARDIAC  CATHETERIZATION  11/29/2005   SEVERE THREE VESSEL OBSTRUCTIVE ATHERSCLEROTIC CAD. ALL GRAFTS WERE PATENT. EF 65%  . CARDIAC CATHETERIZATION  03/23/2017  . CAROTID ENDARTERECTOMY Right 01-29-08   cea  . CARPAL TUNNEL RELEASE  Right   . COLONOSCOPY N/A 12/10/2019   Procedure: COLONOSCOPY;  Surgeon: Wilford Corner, MD;  Location: [facility] ENDOSCOPY;  Service: Endoscopy;  Laterality: N/A;  . CORONARY ARTERY BYPASS GRAFT  03/2003   X3. LIMA GRAFT TO THE LAD, SAPHENOUS VEIN GRAFT TO THE PA, AND A LEFT RADIAL GRAFT TO THEOBTUSE MARGINAL VESSEL  . ENDARTERECTOMY Left 05/28/2012   Procedure: ENDARTERECTOMY CAROTID;  Surgeon: Elam Dutch, MD;  Location: [place];  Service: Vascular;  Laterality: Left;  . ESOPHAGOGASTRODUODENOSCOPY N/A 12/10/2019   Procedure: ESOPHAGOGASTRODUODENOSCOPY (EGD);  Surgeon: Wilford Corner, MD;  Location: [place];  Service: Endoscopy;  Laterality: N/A;  . HEMOSTASIS CLIP PLACEMENT  12/10/2019   Procedure: HEMOSTASIS CLIP PLACEMENT;  Surgeon: Wilford Corner, MD;  Location: [place] ENDOSCOPY;  Service: Endoscopy;;  . INSERT / REPLACE / REMOVE PACEMAKER  03/23/2017  . [place]; 1984  . MOLE SURGERY     "? face/arms"  . PACEMAKER IMPLANT N/A 03/23/2017   Procedure: PACEMAKER IMPLANT;  Surgeon: Constance Haw, MD;  Location: [place] CV LAB;  Service: Cardiovascular;  Laterality: N/A;  . PATCH ANGIOPLASTY Left 05/28/2012   Procedure: WITH dACRON PATCH ANGIOPLASTY;  Surgeon: Elam Dutch, MD;  Location: [place];  Service: Vascular;  Laterality: Left;  . POLYPECTOMY  12/10/2019   Procedure: POLYPECTOMY;  Surgeon: Wilford Corner, MD;  Location: [facility] ENDOSCOPY;  Service: Endoscopy;;  . RIGHT/LEFT HEART CATH AND CORONARY/GRAFT ANGIOGRAPHY N/A 03/23/2017   Procedure: RIGHT/LEFT HEART CATH AND CORONARY/GRAFT ANGIOGRAPHY;  Surgeon: Sherren Mocha, MD;  Location: [place] CV LAB;  Service: Cardiovascular;  Laterality: N/A;  . TEE WITHOUT CARDIOVERSION N/A 05/09/2017   Procedure: TRANSESOPHAGEAL ECHOCARDIOGRAM (TEE);  Surgeon: Sherren Mocha, MD;  Location: [place];  Service: Open Heart Surgery;  Laterality: N/A;  . TRANSCATHETER AORTIC VALVE REPLACEMENT, TRANSFEMORAL N/A 05/09/2017    Procedure: TRANSCATHETER AORTIC VALVE REPLACEMENT, TRANSFEMORAL using an Edwards 49mm Sapien 3 Aortic Valve;  Surgeon: Sherren Mocha, MD;  Location: [place];  Service: Open Heart Surgery;  Laterality: N/A;    SOCIAL HISTORY: Social History   Socioeconomic History  . Marital status: Married    Spouse name: Not on file  . Number of children: 5  . Years of education: Not on file  . Highest education level: Not on file  Occupational History  . Occupation: retired    Fish farm manager: RETIRED  Tobacco Use  . Smoking status: Former Smoker    Packs/day: 2.00    Years: 39.00    Pack years: 78.00    Types: Cigarettes    Quit date: 01/24/1994    Years since quitting: 25.9  . Smokeless tobacco: Never Used  Vaping Use  . Vaping Use: Never used  Substance and Sexual Activity  . Alcohol use: No  . Drug use: No  . Sexual activity: Never  Other Topics Concern  . Not on file  Social History Narrative   On Farm/ in [place]; no alcohol; quit smoking- 96; worked in Estate agent.    Social Determinants of Health   Financial Resource Strain: Not on file  Food Insecurity: Not on file  Transportation Needs: Not on file  Physical Activity: Not on file  Stress: Not on file  Social Connections: Not on file  Intimate Partner Violence:  Not on file    FAMILY HISTORY: Family History  Problem Relation Age of Onset  . Heart attack Father   . Heart attack Brother   . Parkinsonism Brother   . Cancer Brother   . Arrhythmia Brother     ALLERGIES:  is allergic to niacin, niacin and related, metformin and related, and statins.  MEDICATIONS:  Current Outpatient Medications  Medication Sig Dispense Refill  . acetaminophen (TYLENOL) 650 MG CR tablet Take 1,300 mg by mouth every 8 (eight) hours as needed for pain.     Marland Kitchen amLODipine (NORVASC) 2.5 MG tablet Take 1 tablet (2.5 mg total) by mouth daily. 90 tablet 3  . cyanocobalamin 1000 MCG tablet Take 1,000 mcg by mouth daily.     Marland Kitchen ezetimibe  (ZETIA) 10 MG tablet TAKE 1 TABLET BY MOUTH EVERY DAY (Patient taking differently: Take 10 mg by mouth daily.) 90 tablet 2  . furosemide (LASIX) 40 MG tablet Take 40 mg by mouth 2 (two) times daily.    Marland Kitchen glimepiride (AMARYL) 2 MG tablet TAKE 1 TABLET (2 MG TOTAL) BY MOUTH DAILY WITH BREAKFAST (Patient taking differently: Take 2 mg by mouth daily with breakfast.) 90 tablet 0  . hydrALAZINE (APRESOLINE) 25 MG tablet Take 1 tablet (25 mg total) by mouth 2 (two) times daily. 180 tablet 3  . loratadine (CLARITIN) 10 MG tablet Take 10 mg by mouth daily.    . pantoprazole (PROTONIX) 40 MG tablet Take 1 tablet (40 mg total) by mouth daily. 30 tablet 0  . Polyvinyl Alcohol-Povidone (REFRESH OP) Place 1 drop into both eyes daily as needed (for dry eyes).     . tamsulosin (FLOMAX) 0.4 MG CAPS capsule TAKE 1 CAPSULE BY MOUTH ONCE DAILY (Patient taking differently: Take 0.4 mg by mouth daily.) 90 capsule 3  . lenalidomide (REVLIMID) 5 MG capsule Take 1 capsule (5 mg total) by mouth daily. [employer] Auth # [id number]     Date Obtained 01/01/2020 (Patient not taking: Reported on 01/06/2020) 28 capsule 0  . ondansetron (ZOFRAN) 8 MG tablet One pill every 8 hours as needed for nausea/vomitting. 40 tablet 1  . prochlorperazine (COMPAZINE) 10 MG tablet Take 1 tablet (10 mg total) by mouth every 6 (six) hours as needed for nausea or vomiting. 40 tablet 1   No current facility-administered medications for this visit.      PHYSICAL EXAMINATION:   Vitals:   01/06/20 1023  BP: 137/60  Pulse: 64  Temp: 98.2 F (36.8 C)  SpO2: 99%   Filed Weights   01/06/20 1023  Weight: 264 lb 12.8 oz (120.1 kg)    Physical Exam Constitutional:      Comments: Obese male patient in a wheelchair.  Accompanied by his wife.  Nasal cannula oxygen.  HENT:     Head: Normocephalic and atraumatic.     Mouth/Throat:     Pharynx: No oropharyngeal exudate.  Eyes:     Pupils: Pupils are equal, round, and reactive to light.   Cardiovascular:     Rate and Rhythm: Normal rate and regular rhythm.  Pulmonary:     Effort: No respiratory distress.     Breath sounds: No wheezing.     Comments: Decreased air entry bilaterally the bases. Abdominal:     General: Bowel sounds are normal. There is no distension.     Palpations: Abdomen is soft. There is no mass.     Tenderness: There is no abdominal tenderness. There is no guarding or rebound.  Musculoskeletal:  General: No tenderness. Normal range of motion.     Cervical back: Normal range of motion and neck supple.     Right lower leg: Edema present.     Left lower leg: Edema present.  Skin:    General: Skin is warm.  Neurological:     Mental Status: He is alert and oriented to person, place, and time.  Psychiatric:        Mood and Affect: Affect normal.     LABORATORY DATA:  I have reviewed the data as listed Lab Results  Component Value Date   WBC 5.2 01/06/2020   HGB 7.9 (L) 01/06/2020   HCT 24.5 (L) 01/06/2020   MCV 100.0 01/06/2020   PLT 286 01/06/2020   Recent Labs    03/28/19 1416 12/06/19 1630 12/07/19 1015 12/07/19 1844 12/08/19 0749 12/11/19 0228 12/12/19 0259 12/16/19 0757 12/30/19 1446 01/06/20 1008  NA 143 141   < >  --    < > 138 139 141 138 136  K 5.3* 4.4   < >  --    < > 3.9 4.1 3.8 3.3* 3.4*  CL 105 98   < >  --    < > 99 100 99 94* 96*  CO2 22 28   < >  --    < > _0 GLUCOSE 183* 155*   < >  --    < > 147* 131* 121* 140* 147*  BUN 30* 54*   < >  --    < > 42* 43* 31* 35* 31*  CREATININE 1.49* 2.32*   < >  --    < > 2.19* 2.14* 1.60* 1.69* 1.59*  CALCIUM 9.0 9.0   < >  --    < > 8.6* 8.6* 9.2 8.9 8.8*  GFRNONAA 45* 26*   < >  --    < > 30* 31* 44* 41* 44*  GFRAA 52* 30*  --   --   --   --   --   --   --   --   PROT  --   --   --  6.6   < > 5.8* 5.9* 7.0  --   --   ALBUMIN  --   --   --  3.8   < > 3.3* 3.4* 4.0  --   --   AST  --   --   --  15   < > _1 --   --   ALT  --   --   --  17   < > _2 --   --   ALKPHOS  --   --   --  127*   < > 103 110 125  --   --   BILITOT  --   --   --  2.3*   < > 2.2* 2.1* 2.6*  --   --   BILIDIR  --   --   --  0.7*  --   --   --   --   --   --   IBILI  --   --   --  1.6*  --   --   --   --   --   --    < > = values in this interval not displayed.     CT BONE MARROW BIOPSY & ASPIRATION  Result Date: 12/11/2019 CLINICAL DATA:  Anemia EXAM: CT GUIDED DEEP ILIAC BONE ASPIRATION AND CORE BIOPSY TECHNIQUE: Patient was placed prone on the CT gantry and limited axial scans through the pelvis were obtained. Appropriate skin entry site was identified. Skin site was marked, prepped with chlorhexidine, draped in usual sterile fashion, and infiltrated locally with 1% lidocaine. Intravenous Fentanyl 66mg and Versed 142mwere administered as conscious sedation during continuous monitoring of the patient's level of consciousness and physiological / cardiorespiratory status by the radiology RN, with a total moderate sedation time of 20 minutes. Under CT fluoroscopic guidance an 11-gauge Cook trocar bone needle was advanced into the right iliac bone just lateral to the sacroiliac joint. Once needle tip position was confirmed, core and aspiration samples were obtained, submitted to pathology for approval. Post procedure scans show no hematoma or fracture. Patient tolerated procedure well. COMPLICATIONS: COMPLICATIONS none IMPRESSION: 1. Technically successful CT guided right iliac bone core and aspiration biopsy. Electronically Signed   By: D Lucrezia Europe.D.   On: 12/11/2019 09:52   ECHOCARDIOGRAM COMPLETE  Result Date: 12/08/2019    ECHOCARDIOGRAM REPORT   [facility] of Exam: 12/08/2019 Medical Rec #:  [id number]    Height:       72.0 in Accession #:    [id number]   Weight:       270.4 lb Date of Birth:  5/13-Jan-1941    BSA:          2.422 m Patient Age:    [age]       BP:           135/63 mmHg Patient Gender: M              HR:           65  bpm. Exam Location:  Inpatient Procedure: 2D Echo, Cardiac Doppler and Color Doppler Indications:    CHF  History:        Patient has prior history of Echocardiogram examinations, most                 recent 08/27/2018. CHF, CAD, Pacemaker and Prior CABG, Stroke,                 Aortic Valve Disease and TAVR, Arrythmias:Atrial Fibrillation;                 Risk Factors:Hypertension, Dyslipidemia and Diabetes.  Sonographer:    BrDustin Flockeferring Phys: [id number]. Left ventricular ejection fraction, by estimation, is 45 to 50%. The left ventricle has mildly decreased function. The left ventricle demonstrates global hypokinesis. There is moderate concentric left ventricular hypertrophy. Left ventricular diastolic function could not be evaluated.  2. Right ventricular systolic function is moderately reduced. The right ventricular size is moderately enlarged. There is severely elevated pulmonary artery systolic pressure. The estimated right ventricular systolic pressure is 6989.3mHg.  3. Left atrial size was severely dilated.  4. Right atrial size was moderately dilated.  5. The mitral valve is degenerative. Mild mitral valve regurgitation. Moderate to severe mitral annular calcification.  6. The aortic valve has been repaired/replaced. Aortic valve regurgitation is not visualized. Procedure Date: 04/29/2017. Echo findings are consistent with normal structure and function of the aortic valve prosthesis. Aortic valve mean gradient measures  11.0 mmHg. Aortic valve Vmax measures 2.24 m/s.  7. The inferior vena cava is dilated in size with <50% respiratory variability, suggesting right atrial  pressure of 15 mmHg. Comparison(s): Prior images reviewed side by side. The left ventricular function is worsened. The right ventricular systolic function is worse. Pulmonary artery systolic pressure and right atrial pressure are higher. TAVR gradients are improved (possibly  due to lower cardiac  output). FINDINGS  Left Ventricle: Left ventricular ejection fraction, by estimation, is 45 to 50%. The left ventricle has mildly decreased function. The left ventricle demonstrates global hypokinesis. The left ventricular internal cavity size was normal in size. There is  moderate concentric left ventricular hypertrophy. Abnormal (paradoxical) septal motion, consistent with RV pacemaker and abnormal (paradoxical) septal motion consistent with post-operative status. Left ventricular diastolic function could not be evaluated due to atrial fibrillation. Left ventricular diastolic function could not be evaluated. Right Ventricle: The right ventricular size is moderately enlarged. Right vetricular wall thickness was not well visualized. Right ventricular systolic function is moderately reduced. There is severely elevated pulmonary artery systolic pressure. The tricuspid regurgitant velocity is 3.68 m/s, and with an assumed right atrial pressure of 15 mmHg, the estimated right ventricular systolic pressure is 56.8 mmHg. Left Atrium: Left atrial size was severely dilated. Right Atrium: Right atrial size was moderately dilated. Pericardium: There is no evidence of pericardial effusion. Mitral Valve: The mitral valve is degenerative in appearance. Moderate to severe mitral annular calcification. Mild mitral valve regurgitation. Tricuspid Valve: The tricuspid valve is normal in structure. Tricuspid valve regurgitation is trivial. Aortic Valve: The aortic valve has been repaired/replaced. Aortic valve regurgitation is not visualized. Aortic valve mean gradient measures 11.0 mmHg. Aortic valve peak gradient measures 20.1 mmHg. Aortic valve area, by VTI measures 1.44 cm. 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 Echo findings are consistent with normal structure and function of the aortic valve prosthesis. Pulmonic Valve: The pulmonic valve was grossly normal. Pulmonic valve  regurgitation is not visualized. Aorta: The aortic root is normal in size and structure. Venous: The inferior vena cava is dilated in size with less than 50% respiratory variability, suggesting right atrial pressure of 15 mmHg. IAS/Shunts: No atrial level shunt detected by color flow Doppler. Additional Comments: A pacer wire is visualized.  LEFT VENTRICLE PLAX 2D LVIDd:         5.50 cm  Diastology LVIDs:         4.70 cm  LV e' medial:    4.46 cm/s LV PW:         1.50 cm  LV E/e' medial:  31.2 LV IVS:        1.50 cm  LV e' lateral:   5.77 cm/s LVOT diam:     2.00 cm  LV E/e' lateral: 24.1 LV SV:         67 LV SV Index:   28 LVOT Area:     3.14 cm  RIGHT VENTRICLE RV Basal diam:  5.20 cm RV S prime:     4.46 cm/s TAPSE (M-mode): 3.2 cm LEFT ATRIUM             Index       RIGHT ATRIUM           Index LA diam:        5.10 cm 2.11 cm/m  RA Area:     25.40 cm LA Vol (A2C):   93.2 ml 38.48 ml/m RA Volume:   75.50 ml  31.17 ml/m LA Vol (A4C):   89.7 ml 37.04 ml/m LA Biplane Vol: 94.7 ml 39.10 ml/m  AORTIC VALVE AV  Area (Vmax):    1.46 cm AV Area (Vmean):   1.28 cm AV Area (VTI):     1.44 cm AV Vmax:           224.00 cm/s AV Vmean:          158.000 cm/s AV VTI:            0.466 m AV Peak Grad:      20.1 mmHg AV Mean Grad:      11.0 mmHg LVOT Vmax:         104.00 cm/s LVOT Vmean:        64.500 cm/s LVOT VTI:          0.213 m LVOT/AV VTI ratio: 0.46  AORTA Ao Root diam: 3.50 cm MITRAL VALVE                TRICUSPID VALVE MV Area (PHT): 4.49 cm     TR Peak grad:   54.2 mmHg MV Decel Time: 169 msec     TR Vmax:        368.00 cm/s MV E velocity: 139.00 cm/s MV A velocity: 30.60 cm/s   SHUNTS MV E/A ratio:  4.54         Systemic VTI:  0.21 m                             Systemic Diam: 2.00 cm Dani Gobble Croitoru MD Electronically signed by Sanda Klein MD Signature Date/Time: 12/08/2019/3:50:51 PM    Final    CUP PACEART REMOTE DEVICE CHECK  Result Date: 12/18/2019 Scheduled remote reviewed. Normal device function.   Next remote 91 days. HB   MDS (myelodysplastic syndrome), low grade (HCC) # MDS: low grade [nadir hb-6.5- NOV 2021]; s/p bone marrow biopsy; cytogenetics-5 q. deletion  #Continue Retacrit as hemoglobin today is 7.9.  However I would recommend addition of Revlimid 5 mg daily [given CKD stage III]-which is usually in very effective treatment especially for 5 q. Deletion MDS. Discussed the potential side effects of Revlimid including but not limited to diarrhea skin rash thromboembolic events.  Recommend going on [facility] cardiology evaluation this week].  Also discussed the potential teratogenic side effects; and also enrolled in REMs program.  Patient will get started whenever Revlimid is available. Discussed with Blima Ledger, pharmacy.  # CKD stage III [GFR ~46/creatinine 1.59]-44 multifactorial monitor closely on diuretics.  #CHF-noted to have significant weight gain/swelling in the legs-defer to cardiology regarding titration of diuretics.  # A. Fib-Eliquis currently on HOLD sec to severe anemia- [Dr.Jordan]-today hemoglobin is 7.9; given the absence of any active bleeding; reasonable to restart Eliquis; defer to cardiology [appt on 12/15].    #DISPOSITION: # RETACRIT today # 1 week- labs- H&H  retacrit # 2 weeks- labs- H&H- retacrit # 3 weeks-X MD' labs- cbc/bmp-retacrit- # 4 week- labs- H&H  retacrit # 5 weeks- labs- H&H- retacrit  # 6 weeks-# 3 weeks- MD labs- cbc/bmp-retacrit- Dr.B  Addendum: I spoke to patient's daughter [phone] regarding the use of Revlimid which is highly effective treatment for 5 q. deletion MDS.  Treatment of his anemia showed help his fatigue/especially context of his CKD/CHF COPD-chronic respiratory failure.  In spite of the potential side effects of Revlimid-patient should small dose; and hopefully then initially improved.  Cc; Ola Spurr; [name].   All questions were answered. The patient knows to call the clinic with any problems, questions or  concerns.    Lenetta Quaker R  Rogue Bussing, MD 01/06/2020 9:34 PM

## 2020-01-08 ENCOUNTER — Ambulatory Visit (INDEPENDENT_AMBULATORY_CARE_PROVIDER_SITE_OTHER): Payer: Medicare Other | Admitting: Physician Assistant

## 2020-01-08 ENCOUNTER — Other Ambulatory Visit: Payer: Self-pay

## 2020-01-08 ENCOUNTER — Encounter: Payer: Self-pay | Admitting: Physician Assistant

## 2020-01-08 VITALS — BP 134/56 | HR 70 | Ht 72.0 in | Wt 265.0 lb

## 2020-01-08 DIAGNOSIS — I25810 Atherosclerosis of coronary artery bypass graft(s) without angina pectoris: Secondary | ICD-10-CM

## 2020-01-08 DIAGNOSIS — Z953 Presence of xenogenic heart valve: Secondary | ICD-10-CM

## 2020-01-08 DIAGNOSIS — I6523 Occlusion and stenosis of bilateral carotid arteries: Secondary | ICD-10-CM

## 2020-01-08 DIAGNOSIS — I4821 Permanent atrial fibrillation: Secondary | ICD-10-CM

## 2020-01-08 DIAGNOSIS — I10 Essential (primary) hypertension: Secondary | ICD-10-CM

## 2020-01-08 DIAGNOSIS — Z950 Presence of cardiac pacemaker: Secondary | ICD-10-CM

## 2020-01-08 DIAGNOSIS — E119 Type 2 diabetes mellitus without complications: Secondary | ICD-10-CM

## 2020-01-08 DIAGNOSIS — D469 Myelodysplastic syndrome, unspecified: Secondary | ICD-10-CM

## 2020-01-08 DIAGNOSIS — E785 Hyperlipidemia, unspecified: Secondary | ICD-10-CM

## 2020-01-08 DIAGNOSIS — I5043 Acute on chronic combined systolic (congestive) and diastolic (congestive) heart failure: Secondary | ICD-10-CM | POA: Diagnosis not present

## 2020-01-08 MED ORDER — POTASSIUM CHLORIDE CRYS ER 20 MEQ PO TBCR: 20.0000 meq | EXTENDED_RELEASE_TABLET | Freq: Two times a day (BID) | ORAL | 3 refills | Status: AC

## 2020-01-08 MED ORDER — FUROSEMIDE 80 MG PO TABS
ORAL_TABLET | ORAL | 3 refills | Status: DC
Start: 2020-01-08 — End: 2020-02-10

## 2020-01-08 NOTE — Progress Notes (Signed)
Cardiology Office Note:    Date:  01/09/2020   ID:  Evan [name], DOB 1940-07-12, MRN [id number]  PCP:  Leonel Ramsay, MD  [facility] Cardiologist:  [name] [name], MD  [place] Electrophysiologist:  Will Meredith Leeds, MD   Referring MD: Leonel Ramsay, MD   Chief Complaint  Patient presents with  . Follow-up    3 weeks.    History of Present Illness:    Evan [name] is a 79 y.o. male with a hx of CADs/p CABG 2005, carotid artery disease, HTN, HLD, DM 2, permanent atrial fibrillation, pacemaker andsevere AS s/pTAVR. Cardiac catheterization in 2007 demonstrated patent grafts. Myoview in 2013 showed no significant abnormality. He had a left carotid enterectomy in May 2014. He is intolerant to statins due to severe myalgia and weakness. 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 Anticoagulation was initially held, and he was treated with aspirin before restarting Eliquis later. Sleep study in October 2017 showed borderline sleep apnea with minimal desaturation. Echocardiogram in February 2019 showed severe aortic stenosis. Dr. Curt Bears also recommended permanent pacemaker due to severe conduction disease, this was placed near the end of February with a StJude MRI compatible dual-chamber pacemaker on 03/15/2017.He underwent cardiac catheterization on 03/23/2017 which showed severe three-vessel CAD with patent LIMA to LAD, continued patency of free radial graft to OM with tight stenosis at the distal anastomosis involving a small caliber vessel, patent SVG to PDA, severe aortic stenosis. He was evaluated by Dr. Burt Knack and underwent TAVR with Berniece Pap THV size 26 mm bioprosthetic valve by Dr. Cyndia Bent in April 2019. Follow-up echo in May showed good results.  Aspirin was discontinued to allow the patient to stay on Eliquis long-term.  Patient has been seen by oncology service for microcytic anemia.  He also had  possible cirrhosis on previous CT.  Extensive blood work-up was done and was unremarkable.  Bone biopsy was considered however patient declined.  Patient was seen by Dr. [name] on 10/17/2019 at which time he appears to be euvolemic however O2 saturation was 89%.  Home oxygen was arranged.  Patient called cardiology service on 12/04/2019 at which time he had increased weight gain and shortness of breath.  I saw the patient in mid November 2021 for worsening shortness of breath.  He appears to be volume overloaded.  Lab work shows hemoglobin down to 6.  He was admitted to the hospital and underwent transfusion the diuresis.  Bone marrow biopsy showing myelodysplasia.  Eliquis was held.  Benazepril and HCTZ were also held due to hypotension and AKI.  Creatinine stabilized at 2.1.  Hemoglobin improved to 9.  He was eventually discharged on 40 mg daily of Lasix.  Echocardiogram showed EF 45 to 50%, pulmonary hypertension, stable TAVR function.  He was seen by Dr. [name] on 12/17/2019, weight was up 3 pounds since discharge and he also had an increase in lower extremity edema.  He is short of breath and weak. Dr. [name] increased his Lasix to 80 mg twice daily for 3 days then 40 mg twice daily thereafter. Oncology is planning to start Revlimid.   Patient presents today along with his wife.  He remained volume overloaded on exam.  His wife mentions after Dr. [name] temporarily increase his Lasix for 3 days, his primary care provider also did the same.  However the swelling quickly returned.  I recommended increase his Lasix to 80 mg a.m. and 40 mg p.m.  I will start him on potassium 20 mEq twice daily as well.  Recent blood work continue to show he is quite anemic.  He will need basic metabolic panel in 5 to 7 days.  This can be done by oncology service if needed.  According to the patient, his oncologist told him that he will need to restart Eliquis if he were to try on Revlimid.  This can be a consideration however  further discussion will need to be held between cardiology service and oncology service.   Past Medical History:  Diagnosis Date  . Anemia   . Carotid arterial disease ([place])   . CHF (congestive heart failure) ([place])   . Coronary artery disease   . Hearing loss   . HOH (hard of hearing)   . Hyperlipidemia   . Hypertension   . Nocturia   . Obesity   . Osteoarthritis    "BUE; BLE; right knee" (03/23/2017)  . Peripheral vision loss, bilateral    S/P 07/2015  . Permanent atrial fibrillation ([place])   . 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 Severe aortic stenosis   . Sinus drainage   . Stroke [facility]) 07/2015   "peripheral vision still bad out of left eye" (03/23/2017)  . Torn rotator cuff 2012   Right arm  . Type II diabetes mellitus ([place])   . Urinary frequency     Past Surgical History:  Procedure Laterality Date  . BACK SURGERY    . CARDIAC CATHETERIZATION  11/29/2005   SEVERE THREE VESSEL OBSTRUCTIVE ATHERSCLEROTIC CAD. ALL GRAFTS WERE PATENT. EF 65%  . CARDIAC CATHETERIZATION  03/23/2017  . CAROTID ENDARTERECTOMY Right 01-29-08   cea  . CARPAL TUNNEL RELEASE Right   . COLONOSCOPY N/A 12/10/2019   Procedure: COLONOSCOPY;  Surgeon: Wilford Corner, MD;  Location: [facility] ENDOSCOPY;  Service: Endoscopy;  Laterality: N/A;  . CORONARY ARTERY BYPASS GRAFT  03/2003   X3. LIMA GRAFT TO THE LAD, SAPHENOUS VEIN GRAFT TO THE PA, AND A LEFT RADIAL GRAFT TO THEOBTUSE MARGINAL VESSEL  . ENDARTERECTOMY Left 05/28/2012   Procedure: ENDARTERECTOMY CAROTID;  Surgeon: Elam Dutch, MD;  Location: [place];  Service: Vascular;  Laterality: Left;  . ESOPHAGOGASTRODUODENOSCOPY N/A 12/10/2019   Procedure: ESOPHAGOGASTRODUODENOSCOPY (EGD);  Surgeon: Wilford Corner, MD;  Location: [place];  Service: Endoscopy;  Laterality: N/A;  . HEMOSTASIS CLIP PLACEMENT  12/10/2019   Procedure: HEMOSTASIS CLIP PLACEMENT;  Surgeon: Wilford Corner, MD;  Location: [place] ENDOSCOPY;  Service:  Endoscopy;;  . INSERT / REPLACE / REMOVE PACEMAKER  03/23/2017  . [place]; 1984  . MOLE SURGERY     "? face/arms"  . PACEMAKER IMPLANT N/A 03/23/2017   Procedure: PACEMAKER IMPLANT;  Surgeon: Constance Haw, MD;  Location: [place] CV LAB;  Service: Cardiovascular;  Laterality: N/A;  . PATCH ANGIOPLASTY Left 05/28/2012   Procedure: WITH dACRON PATCH ANGIOPLASTY;  Surgeon: Elam Dutch, MD;  Location: [place];  Service: Vascular;  Laterality: Left;  . POLYPECTOMY  12/10/2019   Procedure: POLYPECTOMY;  Surgeon: Wilford Corner, MD;  Location: [facility] ENDOSCOPY;  Service: Endoscopy;;  . RIGHT/LEFT HEART CATH AND CORONARY/GRAFT ANGIOGRAPHY N/A 03/23/2017   Procedure: RIGHT/LEFT HEART CATH AND CORONARY/GRAFT ANGIOGRAPHY;  Surgeon: Sherren Mocha, MD;  [facility] [place] [facility];  Service: Cardiovascular;  Laterality: N/A;  . TEE WITHOUT CARDIOVERSION N/A 05/09/2017   Procedure: TRANSESOPHAGEAL ECHOCARDIOGRAM (TEE);  Surgeon: Sherren Mocha, MD;  Location: [place];  Service: Open Heart Surgery;  Laterality: N/A;  .  TRANSCATHETER AORTIC VALVE REPLACEMENT, TRANSFEMORAL N/A 05/09/2017   Procedure: TRANSCATHETER AORTIC VALVE REPLACEMENT, TRANSFEMORAL using an Edwards 20m Sapien 3 Aortic Valve;  Surgeon: CSherren Mocha MD;  Location[place]  Service: Open Heart Surgery;  Laterality: N/A;    Current Medications: No outpatient medications have been marked as taking for the 01/08/20 encounter (Office Visit) with MAlmyra Deforest PA.     Allergies:   Niacin, Niacin and related, Metformin and related, and Statins   Social History   Socioeconomic History  . Marital status: Married    Spouse name: Not on file  . Number of children: 5  . Years of education: Not on file  . Highest education level: Not on file  Occupational History  . Occupation: retired    EF[employer] farm manager RETIRED  Tobacco Use  . Smoking status: Former Smoker    Packs/day: 2.00    Years: 39.00    Pack years: 78.00     Types: Cigarettes    Quit date: 01/24/1994    Years since quitting: 25.9  . Smokeless tobacco: Never Used  Vaping Use  . Vaping Use: Never used  Substance and Sexual Activity  . Alcohol use: No  . Drug use: No  . Sexual activity: Never  Other Topics Concern  . 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  ROS:   Please see the history of present illness.     All other systems reviewed and are negative.  EKGs/Labs/Other Studies Reviewed:    The following studies were reviewed today:  Echo 12/08/2019 1. Left ventricular ejection fraction, by estimation, is 45 to 50%. The  left ventricle has mildly decreased function. The left ventricle  demonstrates global hypokinesis. There is moderate concentric left  ventricular hypertrophy. Left ventricular  diastolic function could not be evaluated.  2. Right ventricular systolic function is moderately reduced. The right  ventricular size is moderately enlarged. There is severely elevated  pulmonary artery systolic pressure. The estimated right ventricular  systolic pressure is 600.9mmHg.  3. Left atrial size was severely dilated.  4. Right atrial size was moderately dilated.  5. The mitral valve is degenerative. Mild mitral valve regurgitation.  Moderate to severe mitral annular calcification.  6. The aortic valve has been repaired/replaced. Aortic valve  regurgitation is not visualized. Procedure Date: 04/29/2017. Echo findings  are consistent with  normal structure and function of the aortic valve  prosthesis. Aortic valve mean gradient measures  11.0 mmHg. Aortic valve Vmax measures 2.24 m/s.  7. The inferior vena cava is dilated in size with <50% respiratory  variability, suggesting right atrial pressure of 15 mmHg.   Comparison(s): Prior images reviewed side by side. The left ventricular  function is worsened. The right ventricular systolic function is worse.  Pulmonary artery systolic pressure and right atrial pressure are higher.  TAVR gradients are improved (possibly  due to lower cardiac output).   EKG:  EKG is not ordered today.    Recent Labs: 12/06/2019: NT-Pro  BNP 9,423 12/16/2019: ALT 25 01/06/2020: BUN 31; Creatinine, Ser 1.59; Hemoglobin 7.9; Platelets 286; Potassium 3.4; Sodium 136  Recent Lipid Panel    Component Value Date/Time   CHOL 152 08/06/2018 1201   TRIG 107 08/06/2018 1201   HDL 42 08/06/2018 1201   CHOLHDL 3.6 08/06/2018 1201   CHOLHDL 2.3 12/11/2015 0854   VLDL 12 12/11/2015 0854   LDLCALC 89 08/06/2018 1201     Risk Assessment/Calculations:     CHA2DS2-VASc Score = 6  This indicates a 9.7% annual risk of stroke. The patient's score is based upon: CHF History: Yes HTN History: Yes Diabetes History: Yes Stroke History: No Vascular Disease History: Yes Age Score: 2 Gender Score: 0      Physical Exam:    VS:  BP (!) 134/56 (BP Location: Left Arm, Patient Position: Sitting, Cuff Size: Normal)   Pulse 70   Ht 6' (1.829 m)   Wt 265 lb (120.2 kg)   BMI 35.94 kg/m     Wt Readings from Last 3 Encounters:  01/08/20 265 lb (120.2 kg)  01/06/20 264 lb 12.8 oz (120.1 kg)  12/17/19 272 lb (123.4 kg)     GEN:  Well nourished, well developed in no acute distress HEENT: Normal NECK: No JVD; No carotid bruits LYMPHATICS: No lymphadenopathy CARDIAC: RRR, no murmurs, rubs, gallops RESPIRATORY:  Clear to auscultation without rales, wheezing or rhonchi  ABDOMEN: Soft, non-tender,  non-distended MUSCULOSKELETAL:  2+ edema; No deformity  SKIN: Warm and dry NEUROLOGIC:  Alert and oriented x 3 PSYCHIATRIC:  Normal affect   ASSESSMENT:    1. Acute on chronic combined systolic and diastolic CHF (congestive heart failure) ([place])   2. Coronary artery disease involving coronary bypass graft of native heart without angina pectoris   3. Atrial fibrillation, permanent ([place])   4. Essential hypertension   5. Hyperlipidemia LDL goal <70   6. Controlled type 2 diabetes mellitus without complication, without long-term current use of insulin ([place])   7. Pacemaker   8. S/p TAVR (transcatheter aortic valve replacement), bioprosthetic   9. Myelodysplasia (myelodysplastic syndrome) ([place])    PLAN:    In order of problems listed above:  1. Acute on chronic combined systolic and diastolic heart failure: Patient appears to be volume overloaded again.  Since previous discharge, he has been treated with at least 2 course of increased diuretic by Dr. [name] and PCP.  Both treatment course was only for 3 days before lowering the dose back to the previous home dose.  Due to persistent volume overload, I recommended permanently increase Lasix to 80 mg a.m. and 40 mg p.m.  He will also need potassium supplement 20 mEq twice daily.  He will need a basic metabolic panel in 5 to 7 days, this can be done at oncology visit  2. CAD s/p CABG: Denies any chest pain  3. Permanent atrial fibrillation: He is currently not on Eliquis due to recent severe anemia.  However recent bone marrow biopsy was concerning for myelodysplastic disease, oncology service is thinking of starting Revlimid.  Per patient, his oncologist recommended restart Eliquis if he is to start on Revlimid.  I will need to discuss this with oncology service  4. Hypertension: Blood pressure stable  5. Hyperlipidemia: On Zetia  6. DM2: Managed by primary care provider  7. History of pacemaker therapy: Followed by EP  8. History of TAVR:  Stable on last echocardiogram  9. History of myelodysplastic syndrome: Followed by oncology service.  Recently underwent  bone marrow biopsy.        Medication Adjustments/Labs and Tests Ordered: Current medicines are reviewed at length with the patient today.  Concerns regarding medicines are outlined above.  No orders of the defined types were placed in this encounter.  Meds ordered this encounter  Medications  . potassium chloride SA (KLOR-CON) 20 MEQ tablet    Sig: Take 1 tablet (20 mEq total) by mouth 2 (two) times daily.    Dispense:  90 tablet    Refill:  3  . furosemide (LASIX) 80 MG tablet    Sig: Take 80 mg daily in the mornings and 40 mg (half tab) in the evenings    Dispense:  135 tablet    Refill:  3    New Rx dose change    Patient Instructions  Medication Instructions:   INCREASE Lasix to 80 mg in the mornings and 40 mg in the evenings  START Potassium 20 meq 2 times a day  *If you need a refill on your cardiac medications before your next appointment, please call your pharmacy*  Lab Work: NONE ordered at this time of appointment   If you have labs (blood work) drawn today and your tests are completely normal, you will receive your results only by: Marland Kitchen MyChart Message (if you have MyChart) OR . A paper copy in the mail If you have any lab test that is abnormal or we need to change your treatment, we will call you to review the results.  Testing/Procedures: NONE ordered at this time of appointment   Follow-Up: At [facility], you and your health needs are our priority.  As part of our continuing mission to provide you with exceptional heart care, we have created designated Provider Care Teams.  These Care Teams include your primary Cardiologist (physician) and Advanced Practice Providers (APPs -  Physician Assistants and Nurse Practitioners) who all work together to provide you with the care you need, when you need it.  We recommend signing up for the  patient portal called "MyChart".  Sign up information is provided on this After Visit Summary.  MyChart is used to connect with patients for Virtual Visits (Telemedicine).  Patients are able to view lab/test results, encounter notes, upcoming appointments, etc.  Non-urgent messages can be sent to your provider as well.   To learn more about what you can do with MyChart, go to [email or web address].    Your next appointment:   2 week(s)  The format for your next appointment:   In Person  Provider:   [name] [name], MD or Almyra Deforest, PA-C  Other Instructions      Signed, Almyra Deforest, [place]  01/09/2020 1:37 PM    [place]

## 2020-01-08 NOTE — Patient Instructions (Signed)
Medication Instructions:   INCREASE Lasix to 80 mg in the mornings and 40 mg in the evenings  START Potassium 20 meq 2 times a day  *If you need a refill on your cardiac medications before your next appointment, please call your pharmacy*  Lab Work: NONE ordered at this time of appointment   If you have labs (blood work) drawn today and your tests are completely normal, you will receive your results only by: Marland Kitchen MyChart Message (if you have MyChart) OR . A paper copy in the mail If you have any lab test that is abnormal or we need to change your treatment, we will call you to review the results.  Testing/Procedures: NONE ordered at this time of appointment   Follow-Up: At [facility], you and your health needs are our priority.  As part of our continuing mission to provide you with exceptional heart care, we have created designated Provider Care Teams.  These Care Teams include your primary Cardiologist (physician) and Advanced Practice Providers (APPs -  Physician Assistants and Nurse Practitioners) who all work together to provide you with the care you need, when you need it.  We recommend signing up for the patient portal called "MyChart".  Sign up information is provided on this After Visit Summary.  MyChart is used to connect with patients for Virtual Visits (Telemedicine).  Patients are able to view lab/test results, encounter notes, upcoming appointments, etc.  Non-urgent messages can be sent to your provider as well.   To learn more about what you can do with MyChart, go to [email or web address].    Your next appointment:   2 week(s)  The format for your next appointment:   In Person  Provider:   [name] [name], MD or Almyra Deforest, PA-C  Other Instructions

## 2020-01-09 ENCOUNTER — Telehealth: Payer: Self-pay | Admitting: *Deleted

## 2020-01-09 ENCOUNTER — Other Ambulatory Visit: Payer: Self-pay | Admitting: Physician Assistant

## 2020-01-09 ENCOUNTER — Encounter: Payer: Self-pay | Admitting: Physician Assistant

## 2020-01-09 ENCOUNTER — Telehealth: Payer: Self-pay | Admitting: Cardiology

## 2020-01-09 MED ORDER — APIXABAN 5 MG PO TABS: 5.0000 mg | ORAL_TABLET | Freq: Two times a day (BID) | ORAL | 5 refills | Status: AC

## 2020-01-09 NOTE — Telephone Encounter (Signed)
Pt c/o medication issue:  1. Name of Medication: eliquis 5 mg  2. How are you currently taking this medication (dosage and times per day)? 1 tablet twice a day  3. Are you having a reaction (difficulty breathing--STAT)? no  4. What is your medication issue? Patient's wife calling to find out if the patient needs to start back on his eliquis. She states the side effects of chemo is heart attack and stroke and she is worried about him starting the chemo.

## 2020-01-09 NOTE — Telephone Encounter (Signed)
I spoke with oncology service, it was felt that the patient's previous anemia was due to combination of chronic renal dysfunction and MDS rather than GI loss.  Unfortunately that Revlimid treatment for MDS will increase the thrombotic risk as well.  If that the case, my recommendation would be to restart Eliquis 5 mg twice a day.  According to Dr. Rogue Bussing, there is no contraindication from oncology perspective to restart Eliquis.    I will contact the wife and discussed with the initiation of Eliquis 5 mg twice a day.

## 2020-01-09 NOTE — Telephone Encounter (Signed)
Long discussion with the patient's wife, we ended up agree on restarting Eliquis given lack of objective sign of GI bleeding and increased thrombotic risk from Revlimid. Will restart Eliquis to 5mg  BID.

## 2020-01-09 NOTE — Telephone Encounter (Signed)
Evan Ohm-

## 2020-01-09 NOTE — Telephone Encounter (Signed)
Nursing for Dr. [name] will have the PA contact Dr. Rogue Bussing.

## 2020-01-09 NOTE — Telephone Encounter (Signed)
H/T- please call Dr.Jordan's office- and leave my cell number to call  to discuss this pt's care. Im unable to reach thru-secure chat.

## 2020-01-09 NOTE — Telephone Encounter (Signed)
Patient wife called reporting that Dr [name] does not want to restart his Eliquis until he speaks with Dr Rogue Bussing first. Wife states patient does not want to start chemotherapy until he is on a blood thinner. She is asking of Dr B has spoken to Dr [name] cardiologist in [place]. Please return her call [phone]

## 2020-01-09 NOTE — Telephone Encounter (Signed)
See separate phone note today from cardiology. Spoke with Dr. Rogue Bussing and the patient

## 2020-01-09 NOTE — Telephone Encounter (Signed)
Returned call to patient's wife. He has been off Eliquis since Nov 13 -- wife states that Fonda PA was going to contact oncologist about if safe to resume with current chemo meds  Wife states that oncologist gave them the impression is would be OK to resume  Message sent to [place] and Ellison Carwin CMA to advise

## 2020-01-10 ENCOUNTER — Telehealth: Payer: Self-pay | Admitting: Internal Medicine

## 2020-01-10 DIAGNOSIS — D4620 Refractory anemia with excess of blasts, unspecified: Secondary | ICD-10-CM

## 2020-01-10 NOTE — Telephone Encounter (Signed)
On 12/16-spoke to patient's cardiologist's PA, Mr.HaoMeng-regarding the consideration of starting Eliquis given the absence of obvious GI bleed.  Anemia likely from underlying MDS/CKD.  Eliquis is also indicated for thromboprophylaxis in the context of Revlimid.  He will call the patient's wife regarding restarting Eliquis.  Spoke to patient's wife regarding my above discussion with cardiology.  Recommend starting Revlimid as planned starting 12/16.  Hopefully patient will have good response to therapy given MDS with 5 q. Deletion.  H/T-please order cbc/BMP with each lab [as per recommendation with cardiology-CHF/monitor renal function].   Thanks GB

## 2020-01-10 NOTE — Addendum Note (Signed)
Addended by: Gloris Ham on: 01/10/2020 09:25 AM   Modules accepted: Orders

## 2020-01-10 NOTE — Telephone Encounter (Signed)
Standing orders for cbc/metb added per md order

## 2020-01-13 ENCOUNTER — Inpatient Hospital Stay: Payer: Medicare Other

## 2020-01-13 ENCOUNTER — Telehealth: Payer: Self-pay | Admitting: Cardiology

## 2020-01-13 ENCOUNTER — Other Ambulatory Visit: Payer: Self-pay

## 2020-01-13 VITALS — BP 145/78 | HR 64

## 2020-01-13 DIAGNOSIS — D4620 Refractory anemia with excess of blasts, unspecified: Secondary | ICD-10-CM

## 2020-01-13 DIAGNOSIS — D539 Nutritional anemia, unspecified: Secondary | ICD-10-CM | POA: Diagnosis not present

## 2020-01-13 LAB — CBC WITH DIFFERENTIAL/PLATELET
Abs Immature Granulocytes: 0 10*3/uL (ref 0.00–0.07)
Band Neutrophils: 9 %
Basophils Absolute: 0 10*3/uL (ref 0.0–0.1)
Basophils Relative: 1 %
Eosinophils Absolute: 0.4 10*3/uL (ref 0.0–0.5)
Eosinophils Relative: 10 %
HCT: 23.4 % — ABNORMAL LOW (ref 39.0–52.0)
Hemoglobin: 7.5 g/dL — ABNORMAL LOW (ref 13.0–17.0)
Lymphocytes Relative: 11 %
Lymphs Abs: 0.5 10*3/uL — ABNORMAL LOW (ref 0.7–4.0)
MCH: 32.9 pg (ref 26.0–34.0)
MCHC: 32.1 g/dL (ref 30.0–36.0)
MCV: 102.6 fL — ABNORMAL HIGH (ref 80.0–100.0)
Metamyelocytes Relative: 1 %
Monocytes Absolute: 0.2 10*3/uL (ref 0.1–1.0)
Monocytes Relative: 4 %
Neutro Abs: 3.1 10*3/uL (ref 1.7–7.7)
Neutrophils Relative %: 64 %
Platelets: 218 10*3/uL (ref 150–400)
RBC: 2.28 MIL/uL — ABNORMAL LOW (ref 4.22–5.81)
RDW: 22.7 % — ABNORMAL HIGH (ref 11.5–15.5)
Smear Review: ADEQUATE
WBC: 4.2 10*3/uL (ref 4.0–10.5)
nRBC: 0 % (ref 0.0–0.2)

## 2020-01-13 LAB — BASIC METABOLIC PANEL
Anion gap: 12 (ref 5–15)
BUN: 32 mg/dL — ABNORMAL HIGH (ref 8–23)
CO2: 27 mmol/L (ref 22–32)
Calcium: 8.6 mg/dL — ABNORMAL LOW (ref 8.9–10.3)
Chloride: 97 mmol/L — ABNORMAL LOW (ref 98–111)
Creatinine, Ser: 1.79 mg/dL — ABNORMAL HIGH (ref 0.61–1.24)
GFR, Estimated: 38 mL/min — ABNORMAL LOW (ref >60)
Glucose, Bld: 176 mg/dL — ABNORMAL HIGH (ref 70–99)
Potassium: 4.3 mmol/L (ref 3.5–5.1)
Sodium: 136 mmol/L (ref 135–145)

## 2020-01-13 MED ORDER — EPOETIN ALFA-EPBX 10000 UNIT/ML IJ SOLN
20000.0000 [IU] | Freq: Once | INTRAMUSCULAR | Status: AC
  Administered 2020-01-13: 15:00:00 20000 [IU] via SUBCUTANEOUS
  Filled 2020-01-13: qty 2

## 2020-01-13 NOTE — Telephone Encounter (Signed)
New Message  Pt c/o medication issue:  1. Name of Medication: pantoprazole (PROTONIX) 40 MG tablet  2. How are you currently taking this medication (dosage and times per day)? 40 mg 1 x daily - Pt is out of medication   3. Are you having a reaction (difficulty breathing--STAT)? No   4. What is your medication issue? Pts wife is calling and says she cannot get this medication refilled. The medication was prescribed by a physician in the [place] wife is wondering if Dr [name] can refill the medication   Benjamin, [place]. HARRISON S  Please call

## 2020-01-14 ENCOUNTER — Telehealth: Payer: Self-pay | Admitting: Cardiology

## 2020-01-14 DIAGNOSIS — R0602 Shortness of breath: Secondary | ICD-10-CM

## 2020-01-14 DIAGNOSIS — Z79899 Other long term (current) drug therapy: Secondary | ICD-10-CM

## 2020-01-14 DIAGNOSIS — I5043 Acute on chronic combined systolic (congestive) and diastolic (congestive) heart failure: Secondary | ICD-10-CM

## 2020-01-14 MED ORDER — PANTOPRAZOLE SODIUM 40 MG PO TBEC: 40.0000 mg | DELAYED_RELEASE_TABLET | Freq: Every day | ORAL | 3 refills | Status: AC

## 2020-01-14 NOTE — Telephone Encounter (Signed)
New Message  Pt c/o swelling: STAT is pt has developed SOB within 24 hours  1) How much weight have you gained and in what time span? Pts wife says he has gained a little bit 12/15 256.2 today 261  2) If swelling, where is the swelling located? Legs and Feet   3) Are you currently taking a fluid pill? Lasix, 80 mg in morning and 40 mg in afternoon   4) Are you currently SOB? He has SOB at night, and is on Oxygen   5) Do you have a log of your daily weights (if so, list)? Yes pts wife weighs him daily   6) Have you gained 3 pounds in a day or 5 pounds in a week? Yes   7) Have you traveled recently? No

## 2020-01-14 NOTE — Telephone Encounter (Signed)
Follow Up   Pts wife is calling to follow up on medication

## 2020-01-14 NOTE — Telephone Encounter (Signed)
Left message for patients wife, refill sent to the pharmacy but prefer they get future refills from medical doctor. Refill sent to the pharmacy electronically.

## 2020-01-14 NOTE — Telephone Encounter (Signed)
Spoke with patient's wife and informed her of [place], [place] recommendations. Understanding verbalized.

## 2020-01-14 NOTE — Telephone Encounter (Signed)
Renal function slightly worsened, but stable electrolyte. Still very anemic, which is related to MDS. Unfortunately, difficult case due multiple comorbidities. Would continue on the current diuretic. Repeat CBC and BMET in a week at the cancer center if possible.

## 2020-01-14 NOTE — Telephone Encounter (Signed)
Spoke with patient's wife. She wants to know if they should continue 80mg  of Lasix in the AM and 40mg  in the afternoon. She reports patient's weight was 256.2lbs on 12/15, 259.2lbs on 12/16 and has been 260-261lbs daily since then. This morning he weighs 261lbs.   He had his BMP checked yesterday at the cancer center.   Patient is experiencing shortness of breath which interferes with his sleeping at night and during the day he is unable to recline in his chair and elevate his feet as he becomes short of breath with that as well.   Patient has an increase in edema to his legs and feet which wife is concerned about. Patient cannot elevate lower extremities. BP 141/69, HR 67.  Will route to Almyra Deforest for advise and review of lab work.

## 2020-01-19 ENCOUNTER — Telehealth: Payer: Self-pay | Admitting: Oncology

## 2020-01-19 NOTE — Telephone Encounter (Signed)
Patient's wife called and reports that patient feels weak.  Not sleeping well.  She wonders if patient will need blood transfusion tomorrow.  Patient has lab and Retacrit appointment tomorrow at the cancer center. My advice to her is if  patient is very weak, then she  needs to bring patient to the ER to get the blood work done and see if patient needs blood transfusion.  Otherwise tomorrow patient will have blood work done for further evaluation of his condition.  Wife agrees with the plan.

## 2020-01-20 ENCOUNTER — Telehealth: Payer: Self-pay | Admitting: Internal Medicine

## 2020-01-20 ENCOUNTER — Inpatient Hospital Stay: Payer: Medicare Other

## 2020-01-20 ENCOUNTER — Other Ambulatory Visit: Payer: Self-pay | Admitting: *Deleted

## 2020-01-20 VITALS — BP 146/78 | HR 68

## 2020-01-20 DIAGNOSIS — D539 Nutritional anemia, unspecified: Secondary | ICD-10-CM | POA: Diagnosis not present

## 2020-01-20 DIAGNOSIS — D4620 Refractory anemia with excess of blasts, unspecified: Secondary | ICD-10-CM

## 2020-01-20 DIAGNOSIS — R6889 Other general symptoms and signs: Secondary | ICD-10-CM

## 2020-01-20 LAB — CBC WITH DIFFERENTIAL/PLATELET
Abs Immature Granulocytes: 0.1 10*3/uL — ABNORMAL HIGH (ref 0.00–0.07)
Band Neutrophils: 8 %
Basophils Absolute: 0.1 10*3/uL (ref 0.0–0.1)
Basophils Relative: 1 %
Eosinophils Absolute: 0.3 10*3/uL (ref 0.0–0.5)
Eosinophils Relative: 6 %
HCT: 22 % — ABNORMAL LOW (ref 39.0–52.0)
Hemoglobin: 7 g/dL — ABNORMAL LOW (ref 13.0–17.0)
Lymphocytes Relative: 11 %
Lymphs Abs: 0.6 10*3/uL — ABNORMAL LOW (ref 0.7–4.0)
MCH: 33 pg (ref 26.0–34.0)
MCHC: 31.8 g/dL (ref 30.0–36.0)
MCV: 103.8 fL — ABNORMAL HIGH (ref 80.0–100.0)
Metamyelocytes Relative: 1 %
Monocytes Absolute: 0.3 10*3/uL (ref 0.1–1.0)
Monocytes Relative: 5 %
Neutro Abs: 4.1 10*3/uL (ref 1.7–7.7)
Neutrophils Relative %: 68 %
Platelets: 149 10*3/uL — ABNORMAL LOW (ref 150–400)
RBC: 2.12 MIL/uL — ABNORMAL LOW (ref 4.22–5.81)
RDW: 23.8 % — ABNORMAL HIGH (ref 11.5–15.5)
Smear Review: ADEQUATE
WBC: 5.4 10*3/uL (ref 4.0–10.5)
nRBC: 0.6 % — ABNORMAL HIGH (ref 0.0–0.2)

## 2020-01-20 LAB — BASIC METABOLIC PANEL
Anion gap: 17 — ABNORMAL HIGH (ref 5–15)
BUN: 43 mg/dL — ABNORMAL HIGH (ref 8–23)
CO2: 23 mmol/L (ref 22–32)
Calcium: 8.7 mg/dL — ABNORMAL LOW (ref 8.9–10.3)
Chloride: 95 mmol/L — ABNORMAL LOW (ref 98–111)
Creatinine, Ser: 2.08 mg/dL — ABNORMAL HIGH (ref 0.61–1.24)
GFR, Estimated: 32 mL/min — ABNORMAL LOW (ref >60)
Glucose, Bld: 164 mg/dL — ABNORMAL HIGH (ref 70–99)
Potassium: 4.6 mmol/L (ref 3.5–5.1)
Sodium: 135 mmol/L (ref 135–145)

## 2020-01-20 LAB — SAMPLE TO BLOOD BANK

## 2020-01-20 MED ORDER — EPOETIN ALFA-EPBX 10000 UNIT/ML IJ SOLN
20000.0000 [IU] | Freq: Once | INTRAMUSCULAR | Status: AC
  Administered 2020-01-20: 15:00:00 20000 [IU] via SUBCUTANEOUS
  Filled 2020-01-20: qty 2

## 2020-01-21 ENCOUNTER — Telehealth: Payer: Self-pay | Admitting: *Deleted

## 2020-01-21 ENCOUNTER — Inpatient Hospital Stay: Payer: Medicare Other

## 2020-01-21 ENCOUNTER — Other Ambulatory Visit: Payer: Self-pay

## 2020-01-21 DIAGNOSIS — D4620 Refractory anemia with excess of blasts, unspecified: Secondary | ICD-10-CM

## 2020-01-21 DIAGNOSIS — R630 Anorexia: Secondary | ICD-10-CM

## 2020-01-21 DIAGNOSIS — R6889 Other general symptoms and signs: Secondary | ICD-10-CM

## 2020-01-21 DIAGNOSIS — D539 Nutritional anemia, unspecified: Secondary | ICD-10-CM | POA: Diagnosis not present

## 2020-01-21 MED ORDER — DIPHENHYDRAMINE HCL 25 MG PO CAPS
25.0000 mg | ORAL_CAPSULE | Freq: Once | ORAL | Status: AC
  Administered 2020-01-21: 09:00:00 25 mg via ORAL
  Filled 2020-01-21: qty 1

## 2020-01-21 MED ORDER — ACETAMINOPHEN 325 MG PO TABS
650.0000 mg | ORAL_TABLET | Freq: Once | ORAL | Status: AC
  Administered 2020-01-21: 09:00:00 650 mg via ORAL
  Filled 2020-01-21: qty 2

## 2020-01-21 MED ORDER — SODIUM CHLORIDE 0.9% IV SOLUTION
250.0000 mL | Freq: Once | INTRAVENOUS | Status: AC
  Administered 2020-01-21: 09:00:00 250 mL via INTRAVENOUS
  Filled 2020-01-21: qty 250

## 2020-01-21 NOTE — Progress Notes (Signed)
Per Dr Cathie Hoops pt.'s blood transfusion does not need to be irradiated. Blood bank notified.   Elliot Simoneaux [employer]

## 2020-01-21 NOTE — Progress Notes (Signed)
Pt tolerated blood transfusion well with no complications. VSS throughout transfusion and prior to discharge. RN educated pt and spouse on the importance of notifying the clinic if any complications occur at home. Pt and spouse verbalized understanding and all questions answered at this time. Pt stable for discharge. RN escorted pt out via wheelchair to be driven home by wife.   Shaneka Efaw [employer]

## 2020-01-21 NOTE — Telephone Encounter (Signed)
Patient arrived to clinic today for 1 unit of blood. Wife requested to speak to the RN. Wife would like labs from yesterday to be fwd to Dr. Elwanda Brooklyn (pcp) and Dr. [place] in cardiology. Patient is currenlty being treated with lasix. Wife would like providers to be in the "loop" of his most recent creatinine levels. I fwd these labs to the patient's providers per her request. Patient has tolerated the oral chemotherapy. He started the Revlimid on 01/09/2020. Wife states pt has been weak and reports patient is not eating/drinking due to loss of appetite. Referral initiated to [facility] in dietary per patient's wife's request. Wife given new copy of AVS with apts for dietary. apts schedules reviewed with patient's wife. Wife requested samples of glucerna for his husband. Samples provided per her request. RN spent Approximately 25 mins with pt's wife. She thanked me for active listening.

## 2020-01-22 LAB — TYPE AND SCREEN
ABO/RH(D): A POS
Antibody Screen: NEGATIVE
Unit division: 0

## 2020-01-22 LAB — BPAM RBC
Blood Product Expiration Date: 202112292359
ISSUE DATE / TIME: 202112280858
Unit Type and Rh: 9500

## 2020-01-22 NOTE — Telephone Encounter (Signed)
Spoke to patient's wife.She stated husband had a bmet done Monday 12/27.Stated she never received results so she did not give husband his pm dose of Lasix yesterday.Stated he has been taking 80 mg in am and 40 mg pm.Stated his lower legs and feet continue to be swollen.He received a blood transfusion on Mon 12/27 Hgb-7.Advised Dr.Jordan out of office,but I will send message to him for advice.

## 2020-01-22 NOTE — Telephone Encounter (Signed)
Spoke to patient's wife Dr.Jordan's advice given. 

## 2020-01-22 NOTE — Telephone Encounter (Signed)
Pt c/o medication issue:  1. Name of Medication: furosemide (LASIX) 80 MG tablet  2. How are you currently taking this medication (dosage and times per day)? Patient's wife states the patient has been taking the medication as directed. However, yesterday the patient took 80 mg in the morning, but none in the evening.  3. Are you having a reaction (difficulty breathing--STAT)? No   4. What is your medication issue? Patient skipped yesterday's afternoon dose of 40 mg and she would like to know if the patient needs to continue taking the medication. Please advise.

## 2020-01-22 NOTE — Telephone Encounter (Signed)
Error

## 2020-01-22 NOTE — Telephone Encounter (Signed)
I saw results of BMET. Creatinine up a little. I would stay on same dose of lasix. Hopefully correction of anemia will help  Evan [name] [place] MD, [facility]

## 2020-01-23 LAB — PREPARE RBC (CROSSMATCH)

## 2020-01-26 ENCOUNTER — Inpatient Hospital Stay
Admission: EM | Admit: 2020-01-26 | Discharge: 2020-02-04 | DRG: 291 | Disposition: A | Discharge status: Home Health | Payer: Medicare Other | Attending: Internal Medicine | Admitting: Internal Medicine

## 2020-01-26 ENCOUNTER — Emergency Department: Payer: Medicare Other

## 2020-01-26 ENCOUNTER — Telehealth: Payer: Self-pay | Admitting: Cardiology

## 2020-01-26 ENCOUNTER — Other Ambulatory Visit: Payer: Self-pay

## 2020-01-26 DIAGNOSIS — D4620 Refractory anemia with excess of blasts, unspecified: Secondary | ICD-10-CM | POA: Diagnosis present

## 2020-01-26 DIAGNOSIS — E790 Hyperuricemia without signs of inflammatory arthritis and tophaceous disease: Secondary | ICD-10-CM

## 2020-01-26 DIAGNOSIS — D631 Anemia in chronic kidney disease: Secondary | ICD-10-CM | POA: Diagnosis present

## 2020-01-26 DIAGNOSIS — N179 Acute kidney failure, unspecified: Secondary | ICD-10-CM | POA: Diagnosis not present

## 2020-01-26 DIAGNOSIS — D469 Myelodysplastic syndrome, unspecified: Secondary | ICD-10-CM | POA: Diagnosis present

## 2020-01-26 DIAGNOSIS — Z888 Allergy status to other drugs, medicaments and biological substances status: Secondary | ICD-10-CM

## 2020-01-26 DIAGNOSIS — E669 Obesity, unspecified: Secondary | ICD-10-CM | POA: Diagnosis present

## 2020-01-26 DIAGNOSIS — Z87891 Personal history of nicotine dependence: Secondary | ICD-10-CM

## 2020-01-26 DIAGNOSIS — N183 Chronic kidney disease, stage 3 unspecified: Secondary | ICD-10-CM | POA: Diagnosis present

## 2020-01-26 DIAGNOSIS — R609 Edema, unspecified: Secondary | ICD-10-CM

## 2020-01-26 DIAGNOSIS — I5023 Acute on chronic systolic (congestive) heart failure: Secondary | ICD-10-CM | POA: Diagnosis not present

## 2020-01-26 DIAGNOSIS — M1990 Unspecified osteoarthritis, unspecified site: Secondary | ICD-10-CM | POA: Diagnosis present

## 2020-01-26 DIAGNOSIS — H9190 Unspecified hearing loss, unspecified ear: Secondary | ICD-10-CM | POA: Diagnosis present

## 2020-01-26 DIAGNOSIS — M25461 Effusion, right knee: Secondary | ICD-10-CM | POA: Diagnosis not present

## 2020-01-26 DIAGNOSIS — R042 Hemoptysis: Secondary | ICD-10-CM

## 2020-01-26 DIAGNOSIS — N400 Enlarged prostate without lower urinary tract symptoms: Secondary | ICD-10-CM | POA: Diagnosis present

## 2020-01-26 DIAGNOSIS — E785 Hyperlipidemia, unspecified: Secondary | ICD-10-CM | POA: Diagnosis present

## 2020-01-26 DIAGNOSIS — N289 Disorder of kidney and ureter, unspecified: Secondary | ICD-10-CM

## 2020-01-26 DIAGNOSIS — I5033 Acute on chronic diastolic (congestive) heart failure: Secondary | ICD-10-CM | POA: Insufficient documentation

## 2020-01-26 DIAGNOSIS — N1832 Chronic kidney disease, stage 3b: Secondary | ICD-10-CM | POA: Diagnosis not present

## 2020-01-26 DIAGNOSIS — I5043 Acute on chronic combined systolic (congestive) and diastolic (congestive) heart failure: Secondary | ICD-10-CM | POA: Diagnosis present

## 2020-01-26 DIAGNOSIS — I5022 Chronic systolic (congestive) heart failure: Secondary | ICD-10-CM | POA: Diagnosis present

## 2020-01-26 DIAGNOSIS — Z20822 Contact with and (suspected) exposure to covid-19: Secondary | ICD-10-CM | POA: Diagnosis present

## 2020-01-26 DIAGNOSIS — Z8673 Personal history of transient ischemic attack (TIA), and cerebral infarction without residual deficits: Secondary | ICD-10-CM

## 2020-01-26 DIAGNOSIS — Z953 Presence of xenogenic heart valve: Secondary | ICD-10-CM

## 2020-01-26 DIAGNOSIS — I4821 Permanent atrial fibrillation: Secondary | ICD-10-CM | POA: Diagnosis present

## 2020-01-26 DIAGNOSIS — M1120 Other chondrocalcinosis, unspecified site: Secondary | ICD-10-CM | POA: Diagnosis present

## 2020-01-26 DIAGNOSIS — Z8249 Family history of ischemic heart disease and other diseases of the circulatory system: Secondary | ICD-10-CM

## 2020-01-26 DIAGNOSIS — Z515 Encounter for palliative care: Secondary | ICD-10-CM

## 2020-01-26 DIAGNOSIS — Z950 Presence of cardiac pacemaker: Secondary | ICD-10-CM

## 2020-01-26 DIAGNOSIS — I2510 Atherosclerotic heart disease of native coronary artery without angina pectoris: Secondary | ICD-10-CM | POA: Diagnosis present

## 2020-01-26 DIAGNOSIS — E1122 Type 2 diabetes mellitus with diabetic chronic kidney disease: Secondary | ICD-10-CM | POA: Diagnosis present

## 2020-01-26 DIAGNOSIS — R600 Localized edema: Secondary | ICD-10-CM

## 2020-01-26 DIAGNOSIS — E119 Type 2 diabetes mellitus without complications: Secondary | ICD-10-CM | POA: Diagnosis not present

## 2020-01-26 DIAGNOSIS — Z7901 Long term (current) use of anticoagulants: Secondary | ICD-10-CM

## 2020-01-26 DIAGNOSIS — I130 Hypertensive heart and chronic kidney disease with heart failure and stage 1 through stage 4 chronic kidney disease, or unspecified chronic kidney disease: Principal | ICD-10-CM | POA: Diagnosis present

## 2020-01-26 DIAGNOSIS — J9610 Chronic respiratory failure, unspecified whether with hypoxia or hypercapnia: Secondary | ICD-10-CM | POA: Diagnosis present

## 2020-01-26 DIAGNOSIS — Z79899 Other long term (current) drug therapy: Secondary | ICD-10-CM

## 2020-01-26 DIAGNOSIS — Z9981 Dependence on supplemental oxygen: Secondary | ICD-10-CM

## 2020-01-26 DIAGNOSIS — Z7984 Long term (current) use of oral hypoglycemic drugs: Secondary | ICD-10-CM

## 2020-01-26 DIAGNOSIS — R54 Age-related physical debility: Secondary | ICD-10-CM | POA: Diagnosis present

## 2020-01-26 DIAGNOSIS — N2581 Secondary hyperparathyroidism of renal origin: Secondary | ICD-10-CM | POA: Diagnosis present

## 2020-01-26 DIAGNOSIS — K7460 Unspecified cirrhosis of liver: Secondary | ICD-10-CM | POA: Diagnosis present

## 2020-01-26 DIAGNOSIS — I428 Other cardiomyopathies: Secondary | ICD-10-CM | POA: Diagnosis present

## 2020-01-26 DIAGNOSIS — Z951 Presence of aortocoronary bypass graft: Secondary | ICD-10-CM

## 2020-01-26 DIAGNOSIS — R531 Weakness: Secondary | ICD-10-CM

## 2020-01-26 DIAGNOSIS — M109 Gout, unspecified: Secondary | ICD-10-CM

## 2020-01-26 LAB — BASIC METABOLIC PANEL
Anion gap: 14 (ref 5–15)
BUN: 45 mg/dL — ABNORMAL HIGH (ref 8–23)
CO2: 26 mmol/L (ref 22–32)
Calcium: 9 mg/dL (ref 8.9–10.3)
Chloride: 94 mmol/L — ABNORMAL LOW (ref 98–111)
Creatinine, Ser: 2.35 mg/dL — ABNORMAL HIGH (ref 0.61–1.24)
GFR, Estimated: 28 mL/min — ABNORMAL LOW (ref >60)
Glucose, Bld: 150 mg/dL — ABNORMAL HIGH (ref 70–99)
Potassium: 5.1 mmol/L (ref 3.5–5.1)
Sodium: 134 mmol/L — ABNORMAL LOW (ref 135–145)

## 2020-01-26 LAB — URINALYSIS, COMPLETE (UACMP) WITH MICROSCOPIC
Bacteria, UA: NONE SEEN
Bilirubin Urine: NEGATIVE
Glucose, UA: NEGATIVE mg/dL
Hgb urine dipstick: NEGATIVE
Ketones, ur: NEGATIVE mg/dL
Leukocytes,Ua: NEGATIVE
Nitrite: NEGATIVE
Protein, ur: 30 mg/dL — AB
Specific Gravity, Urine: 1.012 (ref 1.005–1.030)
Squamous Epithelial / HPF: NONE SEEN (ref 0–5)
pH: 6 (ref 5.0–8.0)

## 2020-01-26 LAB — CBC
HCT: 22.9 % — ABNORMAL LOW (ref 39.0–52.0)
Hemoglobin: 7.1 g/dL — ABNORMAL LOW (ref 13.0–17.0)
MCH: 32.3 pg (ref 26.0–34.0)
MCHC: 31 g/dL (ref 30.0–36.0)
MCV: 104.1 fL — ABNORMAL HIGH (ref 80.0–100.0)
Platelets: 63 10*3/uL — ABNORMAL LOW (ref 150–400)
RBC: 2.2 MIL/uL — ABNORMAL LOW (ref 4.22–5.81)
RDW: 24 % — ABNORMAL HIGH (ref 11.5–15.5)
WBC: 7.6 10*3/uL (ref 4.0–10.5)
nRBC: 0.5 % — ABNORMAL HIGH (ref 0.0–0.2)

## 2020-01-26 LAB — URIC ACID: Uric Acid, Serum: 14.4 mg/dL — ABNORMAL HIGH (ref 3.7–8.6)

## 2020-01-26 LAB — SYNOVIAL CELL COUNT + DIFF, W/ CRYSTALS
Eosinophils-Synovial: 0 %
Lymphocytes-Synovial Fld: 1 %
Monocyte-Macrophage-Synovial Fluid: 5 %
Neutrophil, Synovial: 94 %
WBC, Synovial: 52805 /mm3 — ABNORMAL HIGH (ref 0–200)

## 2020-01-26 LAB — HEPATIC FUNCTION PANEL
ALT: 24 U/L (ref 0–44)
AST: 26 U/L (ref 15–41)
Albumin: 3.9 g/dL (ref 3.5–5.0)
Alkaline Phosphatase: 132 U/L — ABNORMAL HIGH (ref 38–126)
Bilirubin, Direct: 2.3 mg/dL — ABNORMAL HIGH (ref 0.0–0.2)
Indirect Bilirubin: 3.4 mg/dL — ABNORMAL HIGH (ref 0.3–0.9)
Total Bilirubin: 5.7 mg/dL — ABNORMAL HIGH (ref 0.3–1.2)
Total Protein: 7.1 g/dL (ref 6.5–8.1)

## 2020-01-26 LAB — PHOSPHORUS: Phosphorus: 4.7 mg/dL — ABNORMAL HIGH (ref 2.5–4.6)

## 2020-01-26 LAB — BRAIN NATRIURETIC PEPTIDE: B Natriuretic Peptide: 2541.1 pg/mL — ABNORMAL HIGH (ref 0.0–100.0)

## 2020-01-26 LAB — CREATININE, URINE, RANDOM: Creatinine, Urine: 70.77 mg/dL

## 2020-01-26 LAB — RESP PANEL BY RT-PCR (FLU A&B, COVID) ARPGX2
Influenza A by PCR: NEGATIVE
Influenza B by PCR: NEGATIVE
SARS Coronavirus 2 by RT PCR: NEGATIVE

## 2020-01-26 LAB — CBG MONITORING, ED: Glucose-Capillary: 149 mg/dL — ABNORMAL HIGH (ref 70–99)

## 2020-01-26 LAB — SODIUM, URINE, RANDOM: Sodium, Ur: <10 mmol/L

## 2020-01-26 LAB — MAGNESIUM: Magnesium: 2.3 mg/dL (ref 1.7–2.4)

## 2020-01-26 MED ORDER — TAMSULOSIN HCL 0.4 MG PO CAPS
0.4000 mg | ORAL_CAPSULE | Freq: Every day | ORAL | Status: DC
  Administered 2020-01-26 – 2020-02-03 (×9): 0.4 mg via ORAL
  Filled 2020-01-26 (×9): qty 1

## 2020-01-26 MED ORDER — FENTANYL CITRATE (PF) 100 MCG/2ML IJ SOLN
50.0000 ug | Freq: Once | INTRAMUSCULAR | Status: AC
  Administered 2020-01-26: 50 ug via INTRAVENOUS
  Filled 2020-01-26: qty 2

## 2020-01-26 MED ORDER — METHYLPREDNISOLONE SODIUM SUCC 125 MG IJ SOLR
125.0000 mg | Freq: Once | INTRAMUSCULAR | Status: AC
  Administered 2020-01-26: 125 mg via INTRAVENOUS
  Filled 2020-01-26: qty 2

## 2020-01-26 MED ORDER — FUROSEMIDE 10 MG/ML IJ SOLN
120.0000 mg | Freq: Once | INTRAVENOUS | Status: AC
  Administered 2020-01-26: 120 mg via INTRAVENOUS
  Filled 2020-01-26: qty 12

## 2020-01-26 MED ORDER — FUROSEMIDE 10 MG/ML IJ SOLN
60.0000 mg | Freq: Two times a day (BID) | INTRAMUSCULAR | Status: DC
  Administered 2020-01-26 – 2020-01-29 (×6): 60 mg via INTRAVENOUS
  Filled 2020-01-26 (×6): qty 6

## 2020-01-26 MED ORDER — LIDOCAINE HCL (PF) 1 % IJ SOLN
5.0000 mL | Freq: Once | INTRAMUSCULAR | Status: AC
  Administered 2020-01-26: 5 mL
  Filled 2020-01-26: qty 5

## 2020-01-26 MED ORDER — PROCHLORPERAZINE MALEATE 10 MG PO TABS
10.0000 mg | ORAL_TABLET | Freq: Four times a day (QID) | ORAL | Status: DC | PRN
  Filled 2020-01-26: qty 1

## 2020-01-26 MED ORDER — ACETAMINOPHEN 650 MG RE SUPP: 650.0000 mg | Freq: Four times a day (QID) | RECTAL | Status: DC | PRN

## 2020-01-26 MED ORDER — FENTANYL CITRATE (PF) 100 MCG/2ML IJ SOLN
25.0000 ug | INTRAMUSCULAR | Status: DC | PRN
  Administered 2020-01-26 (×2): 25 ug via INTRAVENOUS
  Filled 2020-01-26 (×2): qty 2

## 2020-01-26 MED ORDER — ACETAMINOPHEN 325 MG PO TABS
650.0000 mg | ORAL_TABLET | Freq: Four times a day (QID) | ORAL | Status: DC | PRN
  Administered 2020-01-28 – 2020-02-01 (×8): 650 mg via ORAL
  Filled 2020-01-26 (×8): qty 2

## 2020-01-26 MED ORDER — INSULIN ASPART 100 UNIT/ML ~~LOC~~ SOLN
0.0000 [IU] | Freq: Three times a day (TID) | SUBCUTANEOUS | Status: DC
  Administered 2020-01-27 (×3): 1 [IU] via SUBCUTANEOUS
  Administered 2020-01-28: 17:00:00 2 [IU] via SUBCUTANEOUS
  Administered 2020-01-28 – 2020-01-29 (×3): 1 [IU] via SUBCUTANEOUS
  Administered 2020-01-29: 17:00:00 2 [IU] via SUBCUTANEOUS
  Administered 2020-01-29 – 2020-01-30 (×2): 1 [IU] via SUBCUTANEOUS
  Administered 2020-01-30: 2 [IU] via SUBCUTANEOUS
  Administered 2020-01-30 – 2020-01-31 (×2): 1 [IU] via SUBCUTANEOUS
  Administered 2020-01-31: 15:00:00 2 [IU] via SUBCUTANEOUS
  Administered 2020-01-31 – 2020-02-01 (×3): 1 [IU] via SUBCUTANEOUS
  Administered 2020-02-01: 5 [IU] via SUBCUTANEOUS
  Administered 2020-02-02: 1 [IU] via SUBCUTANEOUS
  Administered 2020-02-02: 2 [IU] via SUBCUTANEOUS
  Administered 2020-02-02: 4 [IU] via SUBCUTANEOUS
  Administered 2020-02-03: 1 [IU] via SUBCUTANEOUS
  Administered 2020-02-03 (×2): 2 [IU] via SUBCUTANEOUS
  Administered 2020-02-04 (×2): 1 [IU] via SUBCUTANEOUS
  Filled 2020-01-26 (×26): qty 1

## 2020-01-26 MED ORDER — VITAMIN B-12 1000 MCG PO TABS
1000.0000 ug | ORAL_TABLET | Freq: Every day | ORAL | Status: DC
  Administered 2020-01-27 – 2020-02-04 (×9): 1000 ug via ORAL
  Filled 2020-01-26 (×9): qty 1

## 2020-01-26 MED ORDER — HYDRALAZINE HCL 25 MG PO TABS
25.0000 mg | ORAL_TABLET | Freq: Two times a day (BID) | ORAL | Status: DC
  Administered 2020-01-27 – 2020-02-04 (×17): 25 mg via ORAL
  Filled 2020-01-26 (×21): qty 1

## 2020-01-26 NOTE — Progress Notes (Signed)
Brief note regarding plan, with full H&P to follow:   Evan [name] is a 80 y.o. 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     #) Acute on chronic systolic heart failure: Diagnosis on the basis of presenting 1 week of progressive bilateral lower extremity edema associated with shortness of breath and orthopnea, with BNP of 2500 representing an approximate 10 fold increase relative to most recent prior value, while presenting chest x-ray suggestive of CHF in the setting of showing evidence of bilateral pulmonary edema.  This is in the context of known history of chronic systolic heart failure, with most recent echocardiogram performed in November 2021, with LVEF of 45 to 50% at that time, and additional details of the study as further noted above.  Apparent acutely decompensated heart failure appears to have occurred in spite of reported good compliance with home diuretic regimen, which consists of Lasix 80 mg p.o. every morning as well as 40 mg p.o. nightly.  ACS appears to be less likely, with the patient denying any recent chest pain, and EKG without overt evidence of acute ischemic changes, including no evidence of ST elevation.  Heart failure due to chronic anemia is a consideration, although it appears that the patient's heart failure symptoms worsen following recent outpatient PRBC transfusion, as above.  IV Lasix  administered in ED, as further quantified above.  In terms of subsequent IV Lasix dosing, will order 60 mg IV twice daily, representing an effective twofold increase relative to his outpatient dosing of a total of 120 mg of daily oral Lasix.  Given concomitant presenting acute kidney injury superimposed on stage IIIb chronic kidney disease, along with elevated presenting uric acid level, will refrain from scheduled serum potassium supplementation for now, but rather closely monitor ensuing serum potassium levels to guide need for ensuing potassium replacement.  Patient's home cardiac regimen also includes benazepril as well as hydralazine.   Plan: Lasix 60 mg IV twice daily, as above.  Monitor strict I's and O's and daily weights.  Close monitoring of ensuing renal function as well as serum potassium level, as above.  Repeat BMP and serum magnesium levels in the morning.  Monitor on telemetry.  Monitor continuous pulse oximetry.  Consider repeat echocardiogram given acute exacerbation in spite of reported good compliance with home Lasix regimen.  Hold home benazepril for now in the setting of AKI, as above.  Close monitoring of ensuing blood pressures including to ensure for adequate afterload reduction continue hydralazine.     #) Acute kidney injury: In the setting of stage IIIb chronic disease with baseline creatinine of 1.6-2.0, presenting creatinine noted to be 2.35, which is relative to most recent prior value of 2.08 when checked on 01/20/2020.  Suspect that this is prerenal in nature due to diminished renal perfusion associated with decline in renal perfusion gradient on the basis of acutely decompensated heart failure, as above.  As such, will continue the above diuresis efforts, with close monitoring of ensuing renal function.  Of note, patient  is also on benazepril at home, which will currently be held in the setting of AKI.  Presenting urinalysis showed the presence of hyaline casts.  Plan:  Recommend management of presenting acute on chronic systolic failure, including appears to IV diuresis, as above.  Monitor strict I's and O's and daily weights.  Attempt avoid nephrotoxic agents.  Repeat BMP in the morning.  Check serum magnesium and phosphorus levels.  Hold home benazepril.     Evan Pigg, DO Hospitalist

## 2020-01-26 NOTE — Telephone Encounter (Signed)
Patient's wife called in reporting that the patient has been struggling with volume overload for several weeks. He was recently seen in the office with Azalee Course, PA and had follow up labs done at the [facility] with mild increase in Cr. Labs were reviewed by Dr. [place] with recommendations to continue on regimen of lasix 80mg  am and 40mg  pm. Wife reports he developed a fever last night. She notes redness to his right knee, but also left foot is more swollen and warm to touch. She says he is in a lot of pain and does not want her to even touch the foot. With this information I advised I'm concerned for possible infection/celluitis of that foot. He should be seen for this. Wife stated she would call EMS to come evaluate him.

## 2020-01-26 NOTE — ED Notes (Signed)
Pt incontinent of urine. Pt taken to be cleaned by 2 nurses. Pt yells at staff while cleaning pt. Pt able to roll side to side. Pt clothes wet with urine, removed from pt and pt placed in gown. Linen changed. Condom cath placed on pt at this time since pt yells any time he is moved.

## 2020-01-26 NOTE — ED Notes (Signed)
EDP at bedside  

## 2020-01-26 NOTE — H&P (Signed)
History and Physical    PLEASE NOTE THAT [employer]AGON DICT[employer] SOFTWARE WAS USED IN THE CONSTRUCTION OF THIS NOTE.   [name] [id number] DOB: 01-13-1941 DOA: 01/26/2020  PCP: Leonel Ramsay, MD Patient coming from: home   I have personally briefly reviewed patient's old medical records in [place]  Chief Complaint: Bilateral lower extremity swelling  HPI: Evan [name] is a 80 y.o. 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   The following history is provided by the patient as well as his wife, who is present at bedside, in addition to my discussions the emergency department physician, and via chart review.  The patient reports 1 week of progressive swelling in the bilateral lower extremities.  This has been associated with mild shortness of breath as well as orthopnea.  Massive swelling of the bilateral lower extremities has progressed over the last week, he has also noticed associated increased discomfort throughout the bilateral lower extremities distal to the bilateral knees, with most prominent discomfort in the right knee and left foot.  Denies any recent or preceding trauma.  Denies any associated subjective fever, chills, rigors, or generalized myalgias.  Denies any associated cough, wheezing, nausea, vomiting, abdominal pain, diarrhea, or rash.  Also denies any recent chest pain,  diaphoresis, or palpitations.  Not associated with any recent headache, neck stiffness, rhinitis, rhinorrhea, sore throat.  No recent traveling or known COVID-19 exposures.  Has received 2 doses of the Moderna COVID-19 vaccine, with most recent dose occurring in February 2021.  No recent dysuria, gross hematuria, or change in urinary urgency/frequency.  Cardiac history is notable for history of chronic systolic heart failure, with most recent echocardiogram occurring on 12/08/2019 and showing global hypokinesis, mild concentric LVH, LVEF 45 to 50%, and indeterminate diastolic function.  He also has a history of severe aortic stenosis status post TAVR in April 2019 as well as permanent atrial fibrillation complicated by sick sinus syndrome status post pacemaker placement in February 2019 and chronically anticoagulated on Eliquis.  Follows with Dr. [name] as his outpatient cardiologist.  Outpatient diuretic regimen, with which the patient reports good compliance, consists of Lasix 80 mg p.o. every morning and 40 mg p.o. nightly.   Medical history is also notable for myelodysplastic syndrome for which he follows with Dr. Rogue Bussing. Per most recent documentation from Dr. Rogue Bussing, the anti-neoplastic agent Lenalidomide was started on 01/09/2020 as management for his myelodysplastic syndrome.  His mild dysplastic syndrome appears to be complicated with chronic macrocytic anemia, with baseline hemoglobin of 7-8.  It appears that the patient underwent transfusion of 1 unit PRBC on 01/20/2020.     ED Course:  Vital signs in the ED were notable for the following: Temperature max 98.9, heart rate 71; blood pressure 138/61; respiratory rate 18; oxygen saturation 96 to 99% on 2 L nasal cannula.  Labs were notable for the following: CMP was notable for the following: Sodium 134 compared to most recent prior serum sodium data point of 135 on 12 27,021, potassium  5.1, chloride 94, bicarbonate 26, BUN 45, creatinine  2.35 relative to most recent prior value of 2.08 on 01/20/2020, calcium 9.0, albumin 3.9, alkaline phosphatase 132, AST 26, ALT 24.  CBC notable for white blood cell count of 7600, hemoglobin 7.1 relative to most recent prior value 7.0 on 12 27,021, platelets 63.  BNP 2500 with most recent prior data point noted to be 280 in April 2019, uric acid 14.4 with no prior data point available for point of comparison.  Urinalysis showed no white blood cells, no bacteria, no squamous epithelial cells, and was positive for hyaline casts.  Chest x-ray showed evidence of bilateral pulmonary edema consistent with CHF, without evidence of pleural effusion, infiltrate, or pneumothorax.  EKG, in comparison to most recent prior from 12/09/2019, showed atrial fibrillation with frequent ventricular paced complexes, ventricular rate 70, right bundle branch block, T wave inversion in 1, V1, V2 of which inversion leads I and V2 are also noted on most recent prior EKG, nonspecific ST depression in aVL, V3, V4, of which aVL is also noted on prior EKG; no evidence of ST elevation. COVID-19 PCR was checked in the ED this evening, with result currently pending.  Plain films of the right knee showed small acute joint effusion without acute bony abnormality.  While in the ED, the ED physician performed right knee aspiration and subsequently sent associated synovial fluid for analysis.  While in the ED, the following were administered: Lasix 120 mg IV x1; fentanyl 50 mcg IV x1.    Review of Systems: As per HPI otherwise 10 point review of systems negative.   Past Medical History:  Diagnosis Date   Anemia    Carotid arterial disease (HCC)    CHF (congestive heart failure) (HCC)    Coronary artery disease    Hearing loss    HOH (hard of hearing)    Hyperlipidemia    Hypertension    Nocturia    Obesity    Osteoarthritis    "BUE; BLE; right knee" (03/23/2017)   Peripheral vision loss, bilateral    S/P 07/2015    Permanent atrial fibrillation (HCC)    Presence of permanent cardiac pacemaker 03/23/2017   Severe aortic stenosis    Sinus drainage    Stroke ([place]) 07/2015   "peripheral vision still bad out of left eye" (03/23/2017)   Torn rotator cuff 2012   Right arm   Type II diabetes mellitus ([place])    Urinary frequency     Past Surgical History:  Procedure Laterality Date   BACK SURGERY     CARDIAC CATHETERIZATION  11/29/2005   SEVERE THREE VESSEL OBSTRUCTIVE ATHERSCLEROTIC CAD. ALL GRAFTS WERE PATENT. EF 65%   CARDIAC CATHETERIZATION  03/23/2017   CAROTID ENDARTERECTOMY Right 01-29-08   cea   CARPAL TUNNEL RELEASE Right    COLONOSCOPY N/A 12/10/2019   Procedure: COLONOSCOPY;  Surgeon: Wilford Corner, MD;  Location: [place];  Service: Endoscopy;  Laterality: N/A;   CORONARY ARTERY BYPASS GRAFT  03/2003   X3. LIMA GRAFT TO THE LAD, SAPHENOUS VEIN GRAFT TO THE PA, AND A LEFT RADIAL GRAFT TO THEOBTUSE MARGINAL VESSEL   ENDARTERECTOMY Left 05/28/2012   Procedure: ENDARTERECTOMY CAROTID;  Surgeon: Elam Dutch, MD;  Location: [place];  Service: Vascular;  Laterality: Left;   ESOPHAGOGASTRODUODENOSCOPY N/A 12/10/2019   Procedure: ESOPHAGOGASTRODUODENOSCOPY (EGD);  Surgeon: Wilford Corner, MD;  Location: [place];  Service: Endoscopy;  Laterality: N/A;   HEMOSTASIS CLIP PLACEMENT  12/10/2019   Procedure: HEMOSTASIS CLIP  PLACEMENT;  Surgeon: Wilford Corner, MD;  Location: [facility] ENDOSCOPY;  Service: Endoscopy;;   INSERT / REPLACE / REMOVE PACEMAKER  03/23/2017   LUMBAR [place]; [place]     "? face/arms"   PACEMAKER IMPLANT N/A 03/23/2017   Procedure: PACEMAKER IMPLANT;  Surgeon: Constance Haw, MD;  Location: [place] CV LAB;  Service: Cardiovascular;  Laterality: N/A;   PATCH ANGIOPLASTY Left 05/28/2012   Procedure: WITH dACRON PATCH ANGIOPLASTY;  Surgeon: Elam Dutch, MD;  Location: [place];  Service: Vascular;  Laterality:  Left;   POLYPECTOMY  12/10/2019   Procedure: POLYPECTOMY;  Surgeon: Wilford Corner, MD;  Location: [facility] ENDOSCOPY;  Service: Endoscopy;;   RIGHT/LEFT HEART CATH AND CORONARY/GRAFT ANGIOGRAPHY N/A 03/23/2017   Procedure: RIGHT/LEFT HEART CATH AND CORONARY/GRAFT ANGIOGRAPHY;  Surgeon: Sherren Mocha, MD;  Location: [place] CV LAB;  Service: Cardiovascular;  Laterality: N/A;   TEE WITHOUT CARDIOVERSION N/A 05/09/2017   Procedure: TRANSESOPHAGEAL ECHOCARDIOGRAM (TEE);  Surgeon: Sherren Mocha, MD;  Location: [place];  Service: Open Heart Surgery;  Laterality: N/A;   TRANSCATHETER AORTIC VALVE REPLACEMENT, TRANSFEMORAL N/A 05/09/2017   Procedure: TRANSCATHETER AORTIC VALVE REPLACEMENT, TRANSFEMORAL using an Edwards 33mm Sapien 3 Aortic Valve;  Surgeon: Sherren Mocha, MD;  Location: [place];  Service: Open Heart Surgery;  Laterality: N/A;    Social History:  reports that he quit smoking about 26 years ago. His smoking use included cigarettes. He has a 78.00 pack-year smoking history. He has never used smokeless tobacco. He reports that he does not drink alcohol and does not use drugs.   Allergies  Allergen Reactions   Niacin Other (See Comments) and Rash    Red face and burns.   Niacin And Related Rash and Other (See Comments)    Red face and burns.   Metformin And Related Diarrhea   Statins Other (See Comments)    Myalgias, weakness    Family History  Problem Relation Age of Onset   Heart attack Father    Heart attack Brother    Parkinsonism Brother    Cancer Brother    Arrhythmia Brother      Prior to Admission medications   Medication Sig Start Date End Date Taking? Authorizing Provider  acetaminophen (TYLENOL) 650 MG CR tablet Take 1,300 mg by mouth every 8 (eight) hours as needed for pain.    Yes [provider]  amLODipine (NORVASC) 2.5 MG tablet Take 1 tablet (2.5 mg total) by mouth daily. 11/27/19 02/25/20 Yes [name], [name] M, MD  apixaban (ELIQUIS) 5 MG  TABS tablet Take 1 tablet (5 mg total) by mouth 2 (two) times daily. 01/09/20 07/07/20 Yes Almyra Deforest, PA  cyanocobalamin 1000 MCG tablet Take 1,000 mcg by mouth daily.    Yes [provider]  ezetimibe (ZETIA) 10 MG tablet TAKE 1 TABLET BY MOUTH EVERY DAY Patient taking differently: Take 10 mg by mouth daily. 01/08/19  Yes [name], [name] M, MD  furosemide (LASIX) 80 MG tablet Take 80 mg daily in the mornings and 40 mg (half tab) in the evenings 01/08/20  Yes Almyra Deforest, PA  glimepiride (AMARYL) 2 MG tablet TAKE 1 TABLET (2 MG TOTAL) BY MOUTH DAILY WITH BREAKFAST Patient taking differently: Take 2 mg by mouth daily with breakfast. 01/25/18  Yes [name], [name] M, MD  hydrALAZINE (APRESOLINE) 25 MG tablet Take 1 tablet (25 mg total) by mouth 2 (two) times daily. 06/10/19  Yes [name], [name] M, MD  lenalidomide (REVLIMID) 5  MG capsule Take 1 capsule (5 mg total) by mouth daily. Siri Cole # [id number]     Date Obtained 01/01/2020 Patient taking differently: Take 5 mg by mouth daily. [employer] Auth # [id number]     Date Obtained 01/01/2020 01/01/20  Yes Earna Coder, MD  loratadine (CLARITIN) 10 MG tablet Take 10 mg by mouth daily.   Yes [provider]  pantoprazole (PROTONIX) 40 MG tablet Take 1 tablet (40 mg total) by mouth daily. 01/14/20  Yes [place], [name] M, MD  potassium chloride SA (KLOR-CON) 20 MEQ tablet Take 1 tablet (20 mEq total) by mouth 2 (two) times daily. 01/08/20  Yes Azalee Course, PA  tamsulosin (FLOMAX) 0.4 MG CAPS capsule TAKE 1 CAPSULE BY MOUTH ONCE DAILY Patient taking differently: Take 0.4 mg by mouth daily. 09/21/15  Yes [place], [name] M, MD  benazepril (LOTENSIN) 40 MG tablet Take 40 mg by mouth daily. Patient not taking: Reported on 01/26/2020 01/20/20   [provider]  omeprazole (PRILOSEC) 40 MG capsule Take 40 mg by mouth daily. Patient not taking: Reported on 01/26/2020 01/17/20   [provider]  ondansetron (ZOFRAN) 8 MG tablet One pill every 8 hours  as needed for nausea/vomitting. Patient taking differently: Take 8 mg by mouth every 8 (eight) hours as needed for nausea or vomiting. One pill every 8 hours as needed for nausea/vomitting. 01/06/20   Earna Coder, MD  Polyvinyl Alcohol-Povidone (REFRESH OP) Place 1 drop into both eyes daily as needed (for dry eyes).     [provider]  prochlorperazine (COMPAZINE) 10 MG tablet Take 1 tablet (10 mg total) by mouth every 6 (six) hours as needed for nausea or vomiting. 01/06/20   Earna Coder, MD  traZODone (DESYREL) 50 MG tablet Take 50 mg by mouth at bedtime. Patient not taking: Reported on 01/26/2020 01/17/20   [provider]     Objective    Physical Exam: Vitals:   01/26/20 1245 01/26/20 1246 01/26/20 1609  BP:  138/61 128/72  Pulse:  71 78  Resp:  18 18  Temp:  98.9 F (37.2 C)   TempSrc:  Oral   SpO2:  96% 99%  Weight: 112.4 kg    Height: 6' (1.829 m)      General: appears to be stated age; alert Skin: warm, dry, erythema, swelling associated with the right knee Head:  AT/[place] Mouth:  Oral mucosa membranes appear moist, normal dentition Neck: supple; trachea midline Heart:  RRR; did not appreciate any M/R/G Lungs: CTAB, did not appreciate any wheezes, rales, or rhonchi Abdomen: + BS; soft, ND, NT Vascular: 2+ pedal pulses b/l; 2+ radial pulses b/l Extremities: 2+ edema in the bilateral lower extremities; no muscle wasting; erythema and increased warmth associated with the right knee Neuro: sensation intact in upper and lower extremities b/l   Labs on Admission: I have personally reviewed following labs and imaging studies  CBC: Recent Labs  Lab 01/20/20 1445 01/26/20 1300  WBC 5.4 7.6  NEUTROABS 4.1  --   HGB 7.0* 7.1*  HCT 22.0* 22.9*  MCV 103.8* 104.1*  PLT 149* 63*   Basic Metabolic Panel: Recent Labs  Lab 01/20/20 1445 01/26/20 1300  NA 135 134*  K 4.6 5.1  CL 95* 94*  CO2 23 26  GLUCOSE 164* 150*  BUN 43* 45*   CREATININE 2.08* 2.35*  CALCIUM 8.7* 9.0  MG  --  2.3   GFR: Estimated Creatinine Clearance: 33.5 mL/min (A) (by C-G formula  based on SCr of 2.35 mg/dL (H)). Liver Function Tests: Recent Labs  Lab 01/26/20 1300  AST 26  ALT 24  ALKPHOS 132*  BILITOT 5.7*  PROT 7.1  ALBUMIN 3.9   No results for input(s): LIPASE, AMYLASE in the last 168 hours. No results for input(s): AMMONIA in the last 168 hours. Coagulation Profile: No results for input(s): INR, PROTIME in the last 168 hours. Cardiac Enzymes: No results for input(s): CKTOTAL, CKMB, CKMBINDEX, TROPONINI in the last 168 hours. BNP (last 3 results) Recent Labs    12/06/19 1633  PROBNP 9,423*   HbA1C: No results for input(s): HGBA1C in the last 72 hours. CBG: Recent Labs  Lab 01/26/20 1606  GLUCAP 149*   Lipid Profile: No results for input(s): CHOL, HDL, LDLCALC, TRIG, CHOLHDL, LDLDIRECT in the last 72 hours. Thyroid Function Tests: No results for input(s): TSH, T4TOTAL, FREET4, T3FREE, THYROIDAB in the last 72 hours. Anemia Panel: No results for input(s): VITAMINB12, FOLATE, FERRITIN, TIBC, IRON, RETICCTPCT in the last 72 hours. Urine analysis:    Component Value Date/Time   COLORURINE YELLOW (A) 01/26/2020 1503   APPEARANCEUR CLEAR (A) 01/26/2020 1503   LABSPEC 1.012 01/26/2020 1503   PHURINE 6.0 01/26/2020 1503   GLUCOSEU NEGATIVE 01/26/2020 1503   HGBUR NEGATIVE 01/26/2020 1503   BILIRUBINUR NEGATIVE 01/26/2020 1503   KETONESUR NEGATIVE 01/26/2020 1503   PROTEINUR 30 (A) 01/26/2020 1503   UROBILINOGEN 0.2 05/24/2012 1535   NITRITE NEGATIVE 01/26/2020 [place] 01/26/2020 1503    Radiological Exams on Admission: DG Chest 2 View  Result Date: 01/26/2020 CLINICAL DATA:  Shortness of breath EXAM: CHEST - 2 VIEW COMPARISON:  December 07, 2019 FINDINGS: The mediastinal contour and cardiac silhouette are stable. Cardiac pacemaker is unchanged. The heart size is enlarged. Diffuse increased  pulmonary interstitium is identified bilaterally. There is no pleural effusion or focal pneumonia. The bony structures are stable. IMPRESSION: Congestive heart failure. Electronically Signed   By: Abelardo Diesel M.D.   On: 01/26/2020 15:51   DG Knee 2 Views Right  Result Date: 01/26/2020 CLINICAL DATA:  Bilateral leg weakness EXAM: RIGHT KNEE - 1-2 VIEW COMPARISON:  None. FINDINGS: Degenerative changes with joint space narrowing and spurring most pronounced in the patellofemoral compartment. Small joint effusion. No acute bony abnormality. Specifically, no fracture, subluxation, or dislocation. Dense vascular calcifications. IMPRESSION: Degenerative changes as above. Small joint effusion. No acute bony abnormality. Electronically Signed   By: Rolm Baptise M.D.   On: 01/26/2020 14:02     EKG: Independently reviewed, with result as described above.    Assessment/Plan   OMARIO GUMAER is a 80 y.o. 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    Principal Problem:   Acute on chronic systolic CHF (congestive heart failure) (HCC) Active Problems:   Atrial fibrillation, permanent (HCC)   Diabetes mellitus type 2, uncomplicated (HCC)   Acute renal failure superimposed on stage 3 chronic kidney disease (HCC)   MDS (myelodysplastic syndrome), low grade (HCC)   Effusion of  right knee    #) Acute on chronic systolic heart  failure: Diagnosis on the basis of presenting 1 week of progressive bilateral lower extremity edema associated with shortness of breath and orthopnea, with BNP of 2500 representing an approximate 10 fold increase relative to most recent prior value, while presenting chest x-ray suggestive of CHF in the setting of showing evidence of bilateral pulmonary edema.  This is in the context of known history of chronic systolic heart failure, with most recent echocardiogram performed in November 2021, with LVEF of 45 to 50% at that time, and additional details of the study as further noted above.  Apparent acutely decompensated heart failure appears to have occurred in spite of reported good compliance with home diuretic regimen, which consists of Lasix 80 mg p.o. every morning as well as 40 mg p.o. nightly.  ACS appears to be less likely, with the patient denying any recent chest pain, and EKG without overt evidence of acute ischemic changes, including no evidence of ST elevation.  Heart failure due to chronic anemia is a consideration, although it appears that the patient's heart failure symptoms worsen following recent outpatient PRBC transfusion, as above.  IV Lasix administered in ED, as further quantified above.  In terms of subsequent IV Lasix dosing, will order 60 mg IV twice daily, representing an effective twofold increase relative to his outpatient dosing of a total of 120 mg of daily oral Lasix.  Given concomitant presenting acute kidney injury superimposed on stage IIIb chronic kidney disease, along with elevated presenting uric acid level, will refrain from scheduled serum potassium supplementation for now, but rather closely monitor ensuing serum potassium levels to guide need for ensuing potassium replacement.  Patient's home cardiac regimen also includes benazepril as well as hydralazine.   Plan: Lasix 60 mg IV twice daily, as above.  Monitor strict I's and O's and daily weights.  Close monitoring of  ensuing renal function as well as serum potassium level, as above.  Repeat BMP and serum magnesium levels in the morning.  Monitor on telemetry.  Monitor continuous pulse oximetry.  Consider repeat echocardiogram given acute exacerbation in spite of reported good compliance with home Lasix regimen.  Hold home benazepril for now in the setting of AKI, as above.  Close monitoring of ensuing blood pressures including to ensure for adequate afterload reduction continue hydralazine.     #) Acute kidney injury superimposed on stage IIIb chronic kidney disease: In the setting of stage IIIb chronic disease with baseline creatinine of 1.6-2.0, presenting creatinine noted to be 2.35, which is relative to most recent prior value of 2.08 when checked on 01/20/2020.  Suspect that this is prerenal in nature due to diminished renal perfusion associated with decline in renal perfusion gradient on the basis of acutely decompensated heart failure, as above.  As such, will continue the above diuresis efforts, with close monitoring of ensuing renal function.  Of note, patient is also on benazepril at home, which will currently be held in the setting of AKI.  Presenting urinalysis showed the presence of hyaline casts.  Plan: Recommend management of presenting acute on chronic systolic failure, including appears to IV diuresis, as above.  Monitor strict I's and O's and daily weights.  Attempt avoid nephrotoxic agents.  Repeat BMP in the morning.  Check serum magnesium and phosphorus levels.  Hold home benazepril.      #) Right knee effusion: Patient presents with 4 to 5 days of progressive right knee pain associated with swelling,  erythema, and increased warmth to the touch, clinically suggestive of inflammatory arthropathy.  Status post arthrocentesis performed by the emergency department this evening, with ensuing synovial fluid sent for additional testing, including assessment for crystals, cell count with differential,  and protein.  Potentially the basis of gout in the setting of concomitant presenting elevated uric acid level.  Of note, prior to administration of additional systemic corticosteroids, will first want to rule out septic arthritis via review of synovial fluid cell count, which is currently pending.  Additionally, in the context of presenting acutely decompensated heart failure, will also refrain from NSAIDs at this time.  The patient reports mild improvement in his right knee pain following arthrocentesis as well as administration of IV fentanyl.  Presentation not consistent with sepsis at this time.    Plan: Follow for results of synovial fluid analysis, as above, including evaluation for presence of crystals as well as associated evaluation for septic arthritis.  Refraining from additional steroids at this time with evaluation for septic arthritis currently pending.  As needed IV fentanyl.  Repeat CBC with differential in the morning.      #) Chronic macrocytic anemia: In the setting of myelodysplastic syndrome, baseline hemoglobin appears to be 7-8, with the patient undergoing transfusion 1 unit PRBC on 01/20/2020.  Presenting hemoglobin noted to be 7.1, up slightly from most recent prior value of 7.0 on 01/20/2020.   Plan: Repeat CBC in the morning.  Monitor on telemetry.  Check INR.      #) Myelodysplastic syndrome: Associated with chronic macrocytic anemia, as further quantified above appears to have been started on lenalidomide on 01/09/20.  Follows with Dr. Dr. Donneta Romberg as his outpatient heme-onc provider. Of note, will closely monitor for evidence of tumor lysis syndrome in the setting of presenting uric acid level and history of myelodysplastic syndrome on Lenalidomide.  Of note, in the setting of presenting acutely decompensated heart failure, will refrain from administration of IV fluid, but rather further trend uric acid level in response to associated diuresis efforts, as discussed  above.  Plan: IV diuresis, as further described above in the setting of acute decompensated heart failure.  Monitor strict I's and O's.  Repeat uric acid level.  Check serum phosphorus level.  Close monitoring of ensuing renal function as well as serum potassium level, with repeat BMP ordered in the morning.  Repeat CBC in the morning.      #) Type 2 diabetes mellitus: On glimepiride as an outpatient.  Not on exogenous insulin-dependent.  Glucose per presenting CMP noted to be 150.  Plan: Hold home glimepiride for now.  Accu-Cheks before every meal and at bedtime with associated low-dose sliding scale insulin.      #) Permanent atrial fibrillation: Documented history of such and complicated by sick sinus syndrome status post pacemaker placement in February 2019. In the setting of a CHA2DS2-VASc score of 8, there is an indication for the patient to be on chronic anticoagulation for thromboembolic prophylaxis. Consistent with this, the patient is chronically anticoagulated on Eliquis. Most recent echocardiogram was performed in November 2021, with results as further discussed above. Presenting EKG reflects rate controlled atrial fibrillation.    Plan: monitor strict I's & O's and daily weights. Repeat BMP in the morning. Check serum magnesium level.  Reevaluate continuation of home Eliquis in the morning in the setting of presenting thrombocytopenia and chronic anemia.       #) BPH: On Flomax as an outpatient.  Plan: Continue on Flomax.  Monitor strict I's and O's.  Repeat BMP in the morning.     Code Status: Full code Disposition Plan: Per Rounding Team Consults called: none  Admission status: inpatient; med-tele;    Of note, this patient was added by me to the following Admit List/Treatment Team:  armcadmits      PLEASE NOTE THAT DRAGON DICTATION SOFTWARE WAS USED IN THE CONSTRUCTION OF THIS NOTE.   [facility] Pager [phone] From  12PM- 12AM  Otherwise, please contact night-coverage  [email or web address] Password [facility]  01/26/2020, 4:29 PM

## 2020-01-26 NOTE — ED Triage Notes (Signed)
Pt arrives to ER via [facility] from home for leg weakness. Pt screams when touched or moved. C/o bilat leg pain. Unable to stand or move x 2 days. Being treated for CA, last appt Monday.

## 2020-01-26 NOTE — ED Provider Notes (Signed)
[facility] Emergency Department Provider Note  Time seen: 1:29 PM  I have reviewed the triage vital signs and the nursing notes.   HISTORY  Chief Complaint Weakness   HPI Evan [name] is a 80 y.o. male with a past medical history of anemia, CHF, CAD, hypertension, hyperlipidemia, CVA, peripheral edema presents to the emergency department for worsening peripheral edema and foot and knee pain.  According to the patient over the past week or so he has had increasing peripheral edema now with significant pain in his left foot and right knee, worse with attempting ambulation.  Wife states unable to get out of bed for the past 3 days due to pain.  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  Had a recent blood transfusion this past week.  Denies any fever or cough.  Does state shortness of breath worse when he attempts to lie down or lie flat.  No abdominal pain.  No vomiting.   Past Medical History:  Diagnosis Date  . Anemia   . Carotid arterial disease ([place])   . CHF (congestive heart failure) ([place])   . Coronary artery disease   . Hearing loss   . HOH (hard of hearing)   . Hyperlipidemia   . Hypertension   . Nocturia   . Obesity   . Osteoarthritis    "BUE; BLE; right knee" (03/23/2017)  . Peripheral vision loss, bilateral    S/P 07/2015  . Permanent atrial fibrillation ([place])   . 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 Severe aortic stenosis   . Sinus drainage   . Stroke [facility]) 07/2015   "peripheral vision still bad out of left eye" (03/23/2017)  . Torn rotator cuff 2012   Right arm  . Type II diabetes mellitus ([place])   . Urinary frequency     Patient Active Problem List   Diagnosis Date Noted  . Goals of care, counseling/discussion 12/17/2019  . 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 Acute on chronic heart failure with preserved ejection fraction (HFpEF) ([place])  12/10/2019  . Acute renal failure superimposed on stage 3 chronic kidney disease ([place]) 12/10/2019  . Cirrhosis ([place])   . Symptomatic anemia 12/07/2019  . Macrocytic anemia 04/16/2019  . S/P TAVR (transcatheter aortic valve replacement)   . Bronchitis 04/17/2017  . Severe aortic stenosis 03/23/2017  . Atrial fibrillation, permanent ([place]) 03/23/2017  . Peripheral vision loss, bilateral   . Osteoarthritis   . Hypertension   . HOH (hard of hearing)   . Heart murmur   . Frequency of urination   . Dyspnea   . Bruises easily   . Diabetes mellitus type 2, uncomplicated ([place]) 123456  . Essential hypertension 10/15/2015  . 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 Diarrhea 08/24/2015  . Stroke ([place]) 07/25/2015  . Edema of leg 03/20/2015  . Bradycardia 03/20/2015  . Nocturia 10/24/2014  . Atrial fibrillation ([place]) 02/20/2013  . Aftercare following surgery of the circulatory system, [place] 06/07/2012  . Occlusion and stenosis of carotid artery without mention of cerebral infarction 04/14/2011  . Shoulder pain 04/14/2011  . 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 Diabetes mellitus ([place]) 02/01/2011  . Coronary artery disease   . Hyperlipidemia   . Hypertensive urgency   . Obesity   . Carotid arterial disease ([place])   . Torn  rotator cuff 01/24/2010    Past Surgical History:  Procedure Laterality Date  . BACK SURGERY    . CARDIAC CATHETERIZATION  11/29/2005   SEVERE THREE VESSEL OBSTRUCTIVE ATHERSCLEROTIC CAD. ALL GRAFTS WERE PATENT. EF 65%  . CARDIAC CATHETERIZATION  03/23/2017  . CAROTID ENDARTERECTOMY Right 01-29-08   cea  . CARPAL TUNNEL RELEASE Right   . COLONOSCOPY N/A 12/10/2019   Procedure: COLONOSCOPY;  Surgeon: Wilford Corner, MD;  Location: [facility] ENDOSCOPY;  Service: Endoscopy;  Laterality: N/A;  . CORONARY ARTERY BYPASS GRAFT  03/2003   X3. LIMA GRAFT TO THE LAD, SAPHENOUS VEIN  GRAFT TO THE PA, AND A LEFT RADIAL GRAFT TO THEOBTUSE MARGINAL VESSEL  . ENDARTERECTOMY Left 05/28/2012   Procedure: ENDARTERECTOMY CAROTID;  Surgeon: Elam Dutch, MD;  Location: [place];  Service: Vascular;  Laterality: Left;  . ESOPHAGOGASTRODUODENOSCOPY N/A 12/10/2019   Procedure: ESOPHAGOGASTRODUODENOSCOPY (EGD);  Surgeon: Wilford Corner, MD;  Location: [place];  Service: Endoscopy;  Laterality: N/A;  . HEMOSTASIS CLIP PLACEMENT  12/10/2019   Procedure: HEMOSTASIS CLIP PLACEMENT;  Surgeon: Wilford Corner, MD;  Location: [place] ENDOSCOPY;  Service: Endoscopy;;  . INSERT / REPLACE / REMOVE PACEMAKER  03/23/2017  . [place]; 1984  . MOLE SURGERY     "? face/arms"  . PACEMAKER IMPLANT N/A 03/23/2017   Procedure: PACEMAKER IMPLANT;  Surgeon: Constance Haw, MD;  Location: [place] CV LAB;  Service: Cardiovascular;  Laterality: N/A;  . PATCH ANGIOPLASTY Left 05/28/2012   Procedure: WITH dACRON PATCH ANGIOPLASTY;  Surgeon: Elam Dutch, MD;  Location: [place];  Service: Vascular;  Laterality: Left;  . POLYPECTOMY  12/10/2019   Procedure: POLYPECTOMY;  Surgeon: Wilford Corner, MD;  Location: [facility] ENDOSCOPY;  Service: Endoscopy;;  . RIGHT/LEFT HEART CATH AND CORONARY/GRAFT ANGIOGRAPHY N/A 03/23/2017   Procedure: RIGHT/LEFT HEART CATH AND CORONARY/GRAFT ANGIOGRAPHY;  Surgeon: Sherren Mocha, MD;  Location: [place] CV LAB;  Service: Cardiovascular;  Laterality: N/A;  . TEE WITHOUT CARDIOVERSION N/A 05/09/2017   Procedure: TRANSESOPHAGEAL ECHOCARDIOGRAM (TEE);  Surgeon: Sherren Mocha, MD;  Location: [place];  Service: Open Heart Surgery;  Laterality: N/A;  . TRANSCATHETER AORTIC VALVE REPLACEMENT, TRANSFEMORAL N/A 05/09/2017   Procedure: TRANSCATHETER AORTIC VALVE REPLACEMENT, TRANSFEMORAL using an Edwards 24mm Sapien 3 Aortic Valve;  Surgeon: Sherren Mocha, MD;  Location: [place];  Service: Open Heart Surgery;  Laterality: N/A;    Prior to Admission medications    Medication Sig Start Date End Date Taking? Authorizing Provider  acetaminophen (TYLENOL) 650 MG CR tablet Take 1,300 mg by mouth every 8 (eight) hours as needed for pain.     [provider]  amLODipine (NORVASC) 2.5 MG tablet Take 1 tablet (2.5 mg total) by mouth daily. 11/27/19 02/25/20  [name], [name] M, MD  apixaban (ELIQUIS) 5 MG TABS tablet Take 1 tablet (5 mg total) by mouth 2 (two) times daily. 01/09/20 07/07/20  Almyra Deforest, PA  cyanocobalamin 1000 MCG tablet Take 1,000 mcg by mouth daily.     [provider]  ezetimibe (ZETIA) 10 MG tablet TAKE 1 TABLET BY MOUTH EVERY DAY Patient taking differently: Take 10 mg by mouth daily. 01/08/19   [name], [name] M, MD  furosemide (LASIX) 80 MG tablet Take 80 mg daily in the mornings and 40 mg (half tab) in the evenings 01/08/20   Almyra Deforest, PA  glimepiride (AMARYL) 2 MG tablet TAKE 1 TABLET (2 MG TOTAL) BY MOUTH DAILY WITH BREAKFAST Patient taking differently: Take 2 mg by mouth  daily with breakfast. 01/25/18   [place], [name] M, MD  hydrALAZINE (APRESOLINE) 25 MG tablet Take 1 tablet (25 mg total) by mouth 2 (two) times daily. 06/10/19   [place], [name] M, MD  lenalidomide (REVLIMID) 5 MG capsule Take 1 capsule (5 mg total) by mouth daily. [employer] Auth # [id number]     Date Obtained 01/01/2020 Patient not taking: Reported on 01/06/2020 01/01/20   Earna Coder, MD  loratadine (CLARITIN) 10 MG tablet Take 10 mg by mouth daily.    [provider]  ondansetron (ZOFRAN) 8 MG tablet One pill every 8 hours as needed for nausea/vomitting. 01/06/20   Earna Coder, MD  pantoprazole (PROTONIX) 40 MG tablet Take 1 tablet (40 mg total) by mouth daily. 01/14/20   [place], [name] M, MD  Polyvinyl Alcohol-Povidone (REFRESH OP) Place 1 drop into both eyes daily as needed (for dry eyes).     [provider]  potassium chloride SA (KLOR-CON) 20 MEQ tablet Take 1 tablet (20 mEq total) by mouth 2 (two) times daily. 01/08/20   Azalee Course,  PA  prochlorperazine (COMPAZINE) 10 MG tablet Take 1 tablet (10 mg total) by mouth every 6 (six) hours as needed for nausea or vomiting. 01/06/20   Earna Coder, MD  tamsulosin (FLOMAX) 0.4 MG CAPS capsule TAKE 1 CAPSULE BY MOUTH ONCE DAILY Patient taking differently: Take 0.4 mg by mouth daily. 09/21/15   [place], [name] M, MD    Allergies  Allergen Reactions  . Niacin Other (See Comments) and Rash    Red face and burns.  . Niacin And Related Rash and Other (See Comments)    Red face and burns.  . Metformin And Related Diarrhea  . Statins Other (See Comments)    Myalgias, weakness    Family History  Problem Relation Age of Onset  . Heart attack Father   . Heart attack Brother   . Parkinsonism Brother   . Cancer Brother   . Arrhythmia Brother     Social History Social History   Tobacco Use  . Smoking status: Former Smoker    Packs/day: 2.00    Years: 39.00    Pack years: 78.00    Types: Cigarettes    Quit date: 01/24/1994    Years since quitting: 26.0  . Smokeless tobacco: Never Used  Vaping Use  . Vaping Use: Never used  Substance Use Topics  . Alcohol use: No  . Drug use: No    Review of Systems Constitutional: Negative for fever. Cardiovascular: Negative for chest pain. Respiratory: Mild shortness of breath worse when lying down or lying flat Gastrointestinal: Negative for abdominal pain, vomiting Genitourinary: Negative for urinary compaints Musculoskeletal: Positive bilateral lower extremity edema with left foot pain and right knee pain Skin: Negative for skin complaints  Neurological: Negative for headache All other ROS negative  ____________________________________________   PHYSICAL EXAM:  VITAL SIGNS: ED Triage Vitals  Enc Vitals Group     BP 01/26/20 1246 138/61     Pulse Rate 01/26/20 1246 71     Resp 01/26/20 1246 18     Temp 01/26/20 1246 98.9 F (37.2 C)     Temp Source 01/26/20 1246 Oral     SpO2 01/26/20 1246 96 %     Weight  01/26/20 1245 247 lb 11.2 oz (112.4 kg)     Height 01/26/20 1245 6' (1.829 m)     Head Circumference --      Peak Flow --  Pain Score 01/26/20 1243 10     Pain Loc --      Pain Edu? --      Excl. in [place]? --    Constitutional: Alert and oriented. Well appearing and in no distress. Eyes: Normal exam ENT      Head: Normocephalic and atraumatic.      Mouth/Throat: Mucous membranes are moist. Cardiovascular: Normal rate, regular rhythm.  Respiratory: Normal respiratory effort without tachypnea nor retractions. Breath sounds are clear Gastrointestinal: Soft and nontender. No distention.  Obese Musculoskeletal: 4+ lower extreme edema bilaterally.  Tenderness palpation throughout both lower extremities.  Appears to have a right knee effusion denies any known trauma.  Left foot is tender to palpation as well. Neurologic:  Normal speech and language. No gross focal neurologic deficits  Skin:  Skin is warm, dry and intact.  No skin color changes. Psychiatric: Mood and affect are normal.   ____________________________________________    EKG  EKG viewed and interpreted by myself shows what could be ventricular paced versus bundle branch block at 70 bpm with a widened QRS left axis deviation nonspecific ST changes throughout but largely unchanged from prior EKG.  ____________________________________________    RADIOLOGY  X-ray shows degenerative changes with small joint effusion.  ____________________________________________   INITIAL IMPRESSION / ASSESSMENT AND PLAN / ED COURSE  Pertinent labs & imaging results that were available during my care of the patient were reviewed by me and considered in my medical decision making (see chart for details).   Patient presents to the emergency department for generalized weakness unable to bear weight on either leg due to significant edema and pain.  No trauma per patient.  He does have a significant right knee effusion on exam.  We will obtain  an x-ray to further evaluate.  We will dose pain medication.  We will check labs including uric acid.  Patient agreeable to plan of care.  Patient's labs have resulted showing a significant uric acid elevation possibly indicating gout.  Given the patient's knee effusion I performed an arthrocentesis and removed 30 cc of synovial fluid.  Given the patient's significant peripheral edema highly suspect CHF exacerbation.  Chest x-ray pending.  Also given the patient's inability to ambulate due to significant leg pain and swelling likely due to gout and CHF patient will be admitted to the hospital service.  I have ordered 120 mg of IV Lasix as well as Solu-Medrol to treat for gout.  Body fluid pending.  DEVARIUS STROMQUIST was evaluated in Emergency Department on 01/26/2020 for the symptoms described in the history of present illness. He was evaluated in the context of the global COVID-19 pandemic, which necessitated consideration that the patient might be at risk for infection with the SARS-CoV-2 virus that causes COVID-19. Institutional protocols and algorithms that pertain to the evaluation of patients at risk for COVID-19 are in a state of rapid change based on information released by regulatory bodies including the CDC and federal and state organizations. These policies and algorithms were followed during the patient's care in the ED.  ____________________________________________   FINAL CLINICAL IMPRESSION(S) / ED DIAGNOSES  Lower extremity pain Weakness Peripheral edema   Harvest Dark, MD 01/26/20 1506

## 2020-01-26 NOTE — ED Notes (Signed)
X-ray at bedside

## 2020-01-26 NOTE — ED Notes (Signed)
Pt taken to xray via stretcher  

## 2020-01-27 ENCOUNTER — Inpatient Hospital Stay: Payer: Medicare Other

## 2020-01-27 ENCOUNTER — Other Ambulatory Visit: Payer: Self-pay | Admitting: *Deleted

## 2020-01-27 ENCOUNTER — Inpatient Hospital Stay: Payer: Medicare Other | Admitting: Oncology

## 2020-01-27 DIAGNOSIS — I5022 Chronic systolic (congestive) heart failure: Secondary | ICD-10-CM | POA: Diagnosis present

## 2020-01-27 DIAGNOSIS — I5023 Acute on chronic systolic (congestive) heart failure: Secondary | ICD-10-CM | POA: Diagnosis not present

## 2020-01-27 DIAGNOSIS — D4620 Refractory anemia with excess of blasts, unspecified: Secondary | ICD-10-CM

## 2020-01-27 DIAGNOSIS — M25461 Effusion, right knee: Secondary | ICD-10-CM | POA: Diagnosis present

## 2020-01-27 LAB — COMPREHENSIVE METABOLIC PANEL
ALT: 112 U/L — ABNORMAL HIGH (ref 0–44)
AST: 133 U/L — ABNORMAL HIGH (ref 15–41)
Albumin: 3.6 g/dL (ref 3.5–5.0)
Alkaline Phosphatase: 112 U/L (ref 38–126)
Anion gap: 16 — ABNORMAL HIGH (ref 5–15)
BUN: 64 mg/dL — ABNORMAL HIGH (ref 8–23)
CO2: 23 mmol/L (ref 22–32)
Calcium: 9 mg/dL (ref 8.9–10.3)
Chloride: 96 mmol/L — ABNORMAL LOW (ref 98–111)
Creatinine, Ser: 2.6 mg/dL — ABNORMAL HIGH (ref 0.61–1.24)
GFR, Estimated: 24 mL/min — ABNORMAL LOW (ref >60)
Glucose, Bld: 196 mg/dL — ABNORMAL HIGH (ref 70–99)
Potassium: 5.1 mmol/L (ref 3.5–5.1)
Sodium: 135 mmol/L (ref 135–145)
Total Bilirubin: 5.5 mg/dL — ABNORMAL HIGH (ref 0.3–1.2)
Total Protein: 6.9 g/dL (ref 6.5–8.1)

## 2020-01-27 LAB — PHOSPHORUS: Phosphorus: 5.6 mg/dL — ABNORMAL HIGH (ref 2.5–4.6)

## 2020-01-27 LAB — PROTEIN, BODY FLUID (OTHER): Total Protein, Body Fluid Other: 4.1 g/dL

## 2020-01-27 LAB — CBC
HCT: 21.1 % — ABNORMAL LOW (ref 39.0–52.0)
Hemoglobin: 6.6 g/dL — ABNORMAL LOW (ref 13.0–17.0)
MCH: 33.2 pg (ref 26.0–34.0)
MCHC: 31.3 g/dL (ref 30.0–36.0)
MCV: 106 fL — ABNORMAL HIGH (ref 80.0–100.0)
Platelets: 50 10*3/uL — ABNORMAL LOW (ref 150–400)
RBC: 1.99 MIL/uL — ABNORMAL LOW (ref 4.22–5.81)
RDW: 23.6 % — ABNORMAL HIGH (ref 11.5–15.5)
WBC: 7.8 10*3/uL (ref 4.0–10.5)
nRBC: 0.9 % — ABNORMAL HIGH (ref 0.0–0.2)

## 2020-01-27 LAB — GLUCOSE, CAPILLARY
Glucose-Capillary: 185 mg/dL — ABNORMAL HIGH (ref 70–99)
Glucose-Capillary: 156 mg/dL — ABNORMAL HIGH (ref 70–99)
Glucose-Capillary: 162 mg/dL — ABNORMAL HIGH (ref 70–99)
Glucose-Capillary: 161 mg/dL — ABNORMAL HIGH (ref 70–99)

## 2020-01-27 LAB — URIC ACID: Uric Acid, Serum: 16.3 mg/dL — ABNORMAL HIGH (ref 3.7–8.6)

## 2020-01-27 LAB — MAGNESIUM: Magnesium: 2.5 mg/dL — ABNORMAL HIGH (ref 1.7–2.4)

## 2020-01-27 LAB — PREPARE RBC (CROSSMATCH)

## 2020-01-27 LAB — GLUCOSE, BODY FLUID OTHER: Glucose, Body Fluid Other: 12 mg/dL

## 2020-01-27 MED ORDER — SODIUM CHLORIDE 0.9% IV SOLUTION: Freq: Once | INTRAVENOUS | Status: AC

## 2020-01-27 MED ORDER — NALOXONE HCL 0.4 MG/ML IJ SOLN
0.4000 mg | INTRAMUSCULAR | Status: DC | PRN
  Administered 2020-01-27: 0.4 mg via INTRAVENOUS
  Filled 2020-01-27: qty 1

## 2020-01-27 MED ORDER — METHYLPREDNISOLONE SODIUM SUCC 125 MG IJ SOLR
60.0000 mg | Freq: Every day | INTRAMUSCULAR | Status: DC
  Administered 2020-01-27: 09:00:00 60 mg via INTRAVENOUS
  Filled 2020-01-27: qty 2

## 2020-01-27 MED ORDER — LENALIDOMIDE 15 MG PO CAPS: 15.0000 mg | ORAL_CAPSULE | Freq: Every day | ORAL | Status: DC

## 2020-01-27 MED ORDER — APIXABAN 2.5 MG PO TABS
2.5000 mg | ORAL_TABLET | Freq: Two times a day (BID) | ORAL | Status: DC
  Administered 2020-01-27 – 2020-01-30 (×7): 2.5 mg via ORAL
  Filled 2020-01-27 (×7): qty 1

## 2020-01-27 MED ORDER — LENALIDOMIDE 5 MG PO CAPS
5.0000 mg | ORAL_CAPSULE | Freq: Every day | ORAL | Status: DC
  Administered 2020-01-27 – 2020-01-28 (×2): 5 mg via ORAL
  Filled 2020-01-27 (×2): qty 1

## 2020-01-27 NOTE — Progress Notes (Signed)
OT Cancellation Note  Patient Details Name: Evan [name] MRN: [id number] DOB: Nov 30, 1940   Cancelled Treatment:    Reason Eval/Treat Not Completed: Other (comment)  Pt with Hgb of 6.6 and to receive transfusion, will attempt to f/u for OT eval after transfusion. Thank you.  Rejeana Brock, MS, OTR/L ascom [phone] 01/27/20, 11:46 AM

## 2020-01-27 NOTE — Telephone Encounter (Signed)
Dr. Smith Robert, Will we need to hold Revlimid given the recent hospitalization? Please let me know. I have not submitted the Celegene Auth for RF at this time.

## 2020-01-27 NOTE — Telephone Encounter (Signed)
Patient's wife is requesting to speak with Dr. Elvis Coil nurse, Elnita Maxwell. She would like to make her aware that the patient is currently admitted at [facility]. She states she would like to update her on his condition.

## 2020-01-27 NOTE — Progress Notes (Signed)
PT Cancellation Note  Patient Details Name: Evan [name] MRN: [id number] DOB: October 06, 1940   Cancelled Treatment:    Reason Eval/Treat Not Completed: Medical issues which prohibited therapy. Hemoglobin is 6.6 and patient anticipated to get transfusion later today per nurse. PT will hold and follow up as appropriate.   Donna Bernard, PT, MPT   Ina Homes 01/27/2020, 11:07 AM

## 2020-01-27 NOTE — Consult Note (Signed)
ANTICOAGULATION CONSULT NOTE  Pharmacy Consult for Apixaban Indication: atrial fibrillation  Patient Measurements: Height: 6' (182.9 cm) Weight: 112.4 kg (247 lb 11.2 oz) IBW/kg (Calculated) : 77.6  Labs: Recent Labs    01/26/20 1300 01/27/20 0718  HGB 7.1* 6.6*  HCT 22.9* 21.1*  PLT 63* 50*  CREATININE 2.35* 2.60*    Estimated Creatinine Clearance: 30.3 mL/min (A) (by C-G formula based on SCr of 2.6 mg/dL (H)).   Medical History: Past Medical History:  Diagnosis Date  . Anemia   . Carotid arterial disease (HCC)   . CHF (congestive heart failure) (HCC)   . Coronary artery disease   . Hearing loss   . HOH (hard of hearing)   . Hyperlipidemia   . Hypertension   . Nocturia   . Obesity   . Osteoarthritis    "BUE; BLE; right knee" (03/23/2017)  . Peripheral vision loss, bilateral    S/P 07/2015  . Permanent atrial fibrillation (HCC)   . 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 Severe aortic stenosis   . Sinus drainage   . Stroke [facility]) 07/2015   "peripheral vision still bad out of left eye" (03/23/2017)  . Torn rotator cuff 2012   Right arm  . Type II diabetes mellitus (HCC)   . Urinary frequency     Medications:  Apixaban 5 mg BID prior to admission  Assessment: Patient is a 80 y/o M with medical history including atrial fibrillation on apixaban and MDS on lenalidomide who is admitted with fluid overload and AKI on CKD. Patient is also receiving ESA on outpatient basis. Pharmacy has been consulted to initiate apixaban for atrial fibrillation.   Labs are notable for worsening anemia (Hgb 7.1 >> 6.6) and thrombocytopenia (Plt 63 >> 50) since admission.    Plan:  --Patient is borderline dose adjustment for apixaban for indication of atrial fibrillation (age 20, Scr > 1.5) which may be offset by larger body habitus --Discussed with provider, given worsening renal function and high risk for bleeding given low platelets, will initiate apixaban at reduced  dose of 2.5 mg BID --Continue to monitor CBC and for signs / symptoms of bleeding  Tressie Ellis 01/27/2020,10:18 AM

## 2020-01-27 NOTE — Progress Notes (Signed)
PROGRESS NOTE    Evan [name]  [id number] DOB: 16-Oct-1940 DOA: 01/26/2020 PCP: Leonel Ramsay, MD   Chief Complain: Bilateral lower extremity swelling  Brief Narrative: Patient is a 80 year old male with history of chronic systolic heart failure, severe aortic stenosis status post TAVR, permanent A. fib, myelodysplastic syndrome, chronic anemia, type 2 diabetes, hypertension, stage IIIb CKD with a baseline creatinine of 1.6-2.0 who presented with complaints of bilateral lower extremity swelling.He was also complaining of dyspnea, orthopnea.  He also was noticed to have right knee effusion.  Patient was admitted for the management of acute on chronic congestive heart failure and possible gout.  He underwent arthrocentesis of the knee which showed urate crystals.  Assessment & Plan:   Principal Problem:   Acute on chronic systolic CHF (congestive heart failure) (HCC) Active Problems:   Atrial fibrillation, permanent (HCC)   Diabetes mellitus type 2, uncomplicated (HCC)   Acute renal failure superimposed on stage 3 chronic kidney disease (HCC)   MDS (myelodysplastic syndrome), low grade (HCC)   Effusion of right knee   Acute on chronic systolic congestive heart failure:Echocardiogram done  on 12/08/2019 and showing global hypokinesis, mild concentric LVH, LVEF 45 to 50%, and indeterminate diastolic function.  He also has a history of severe aortic stenosis status post TAVR in April 2019 as well as permanent atrial fibrillation complicated by sick sinus syndrome status post pacemaker placement in February 2019 and chronically anticoagulated on Eliquis.  Follows with Dr. [name] as his outpatient cardiologist.  Dewaine Conger Lasix 80 mg p.o. every morning and 40 mg p.o. nightly at home. He has been started on aggressive diuresis with Lasix IV 60 mg twice a day.  Continue to monitor input and output, daily weight He has severe bilateral lower extremity edema.  Elevated BNP.  Patient is on 2 L of  oxygen at home.  Currently on baseline oxygen requirement  AKI on CKD stage IIIb: Most likely secondary to cardiorenal syndrome.  His baseline creatinine ranges from 1.6-2.  Continue diuresis.  Monitor BMP.  Right knee effusion: Presented with 4 to 5 days history of progressive right knee pain associated with swelling, erythema suggesting inflammatory arthropathy.  Underwent arthrocentesis in the emergency department.  Synovial fluid significant neutrophils suggesting inflammatory arthritis, presence of urate crystals.  He has severely elevated uric acid level.  He has acute gout flare, will not start allopurinol for now,shd consider allopurinol after resolution of acute flare.  Colchicine is contraindicated on the background office CKD.  We will continue slow taper of the steroids of prednisone . Given 60 mg of IV Solu-Medrol on 01/27/19. He needs to follow-up with rheumatology as an outpatient.  Chronic macrocytic anemia/thrombocytopenia: History of myelodysplastic syndrome.  Follows with Dr. Rogue Bussing.  He was a started on Lenalidomide was started on 01/09/2020 as management for his myelodysplastic syndrome.  His MDS appears to be complicated with chronic macrocytic anemia, with baseline hemoglobin of 7-8. He  underwent transfusion of 1 unit PRBC on 01/20/2020. Hemoglobin dropped to 6.6 on 01/27/2020 and he was transfused with unit of PRBC.  Type diabetes mellitus: Takes glimepiride at home.  Continue sliding-scale insulin here.  Monitor blood sugars  Permanent A. [id number] history of such and complicated by sick sinus syndrome status post pacemaker placement in February 2019.  On Eliquis for anticoagulation.We will reduce the dose of eliquis to 2.5 BID . currently rate is controlled  BPH:On  Flomax  Elevated liver enzymes: Mild, unclear etiology.  Continue to monitor.Check hepatitis panel  Hypertension: Currently blood pressure stable.  Antihypertensives on hold.  Debility/deconditioning:  We will request for PT/OT evaluation            DVT prophylaxis:Eliquis Code Status: Full Family Communication: wife at bedside Status is: Inpatient  Remains inpatient appropriate because:Inpatient level of care appropriate due to severity of illness   Dispo: The patient is from: Home              Anticipated d/c is to: Home              Anticipated d/c date is: 3 days              Patient currently is not medically stable to d/c.=  Consultants: None  Procedures:None  Antimicrobials:  Anti-infectives (From admission, onward)   None      Subjective: Patient seen and examined the bedside this morning.  Hemodynamically stable during my evaluation.  He was complaining of severe right knee pain and left toe pain.  Wife at the bedside.  Looks volume overloaded  Objective: Vitals:   01/26/20 2112 01/26/20 2357 01/27/20 0553 01/27/20 0736  BP: 129/65 (!) 131/58 (!) 134/59 (!) 127/59  Pulse: 69 60 67 68  Resp: 18 18 18 19   Temp: 97.8 F (36.6 C) 99.4 F (37.4 C) 98.2 F (36.8 C) 98.2 F (36.8 C)  TempSrc: Oral Axillary Oral   SpO2: 97% 95% 94% 93%  Weight:      Height:        Intake/Output Summary (Last 24 hours) at 01/27/2020 0758 Last data filed at 01/27/2020 03/26/2020 Gross per 24 hour  Intake -  Output 750 ml  Net -750 ml   Filed Weights   01/26/20 1245  Weight: 112.4 kg    Examination:  General exam: Obese, overall comfortable Respiratory system: Bilateral equal air entry, normal vesicular breath sounds, no wheezes or crackles  Cardiovascular system: S1 & S2 heard, RRR. No JVD, murmurs, rubs, gallops or clicks.  Gastrointestinal system: Abdomen is nondistended, soft and nontender. No organomegaly or masses felt. Normal bowel sounds heard. Central nervous system: Alert and oriented. No focal neurological deficits. Extremities: 2-3+ bilateral lower extremity edema edema, no clubbing ,no cyanosis, tender right knee Skin: No rashes, lesions or ulcers,no icterus  ,no pallor    Data Reviewed: I have personally reviewed following labs and imaging studies  CBC: Recent Labs  Lab 01/20/20 1445 01/26/20 1300  WBC 5.4 7.6  NEUTROABS 4.1  --   HGB 7.0* 7.1*  HCT 22.0* 22.9*  MCV 103.8* 104.1*  PLT 149* 63*   Basic Metabolic Panel: Recent Labs  Lab 01/20/20 1445 01/26/20 1300 01/26/20 1400  NA 135 134*  --   K 4.6 5.1  --   CL 95* 94*  --   CO2 23 26  --   GLUCOSE 164* 150*  --   BUN 43* 45*  --   CREATININE 2.08* 2.35*  --   CALCIUM 8.7* 9.0  --   MG  --  2.3  --   PHOS  --   --  4.7*   GFR: Estimated Creatinine Clearance: 33.5 mL/min (A) (by C-G formula based on SCr of 2.35 mg/dL (H)). Liver Function Tests: Recent Labs  Lab 01/26/20 1300  AST 26  ALT 24  ALKPHOS 132*  BILITOT 5.7*  PROT 7.1  ALBUMIN 3.9   No results for input(s): LIPASE, AMYLASE in the last 168 hours. No results for input(s): AMMONIA in the last 168 hours. Coagulation Profile:  No results for input(s): INR, PROTIME in the last 168 hours. Cardiac Enzymes: No results for input(s): CKTOTAL, CKMB, CKMBINDEX, TROPONINI in the last 168 hours. BNP (last 3 results) Recent Labs    12/06/19 1633  PROBNP 9,423*   HbA1C: No results for input(s): HGBA1C in the last 72 hours. CBG: Recent Labs  Lab 01/26/20 1606 01/27/20 0735  GLUCAP 149* 185*   Lipid Profile: No results for input(s): CHOL, HDL, LDLCALC, TRIG, CHOLHDL, LDLDIRECT in the last 72 hours. Thyroid Function Tests: No results for input(s): TSH, T4TOTAL, FREET4, T3FREE, THYROIDAB in the last 72 hours. Anemia Panel: No results for input(s): VITAMINB12, FOLATE, FERRITIN, TIBC, IRON, RETICCTPCT in the last 72 hours. Sepsis Labs: No results for input(s): PROCALCITON, LATICACIDVEN in the last 168 hours.  Recent Results (from the past 240 hour(s))  Body fluid culture     Status: None (Preliminary result)   Collection Time: 01/26/20  3:03 PM   Specimen: KNEE; Body Fluid  Result Value Ref Range  Status   Specimen Description   Final    KNEE Performed at [facility], [address], [place], [place] [address]    Special Requests   Final    NONE Performed at [facility], [address] [place], [place] [address]    Gram Stain   Final    FEW WBC PRESENT, PREDOMINANTLY PMN NO ORGANISMS SEEN Performed at [facility], [place] [address], [place], [place] [address]    Culture PENDING  Incomplete   Report Status PENDING  Incomplete  Resp Panel by RT-PCR (Flu A&B, Covid) Nasopharyngeal Swab     Status: None   Collection Time: 01/26/20  4:01 PM   Specimen: Nasopharyngeal Swab; Nasopharyngeal(NP) swabs in vial transport medium  Result Value Ref Range Status   SARS Coronavirus 2 by RT PCR NEGATIVE NEGATIVE Final    Comment: (NOTE) SARS-CoV-2 target nucleic acids are NOT DETECTED.  The SARS-CoV-2 RNA is generally detectable in upper respiratory specimens during the acute phase of infection. The lowest concentration of SARS-CoV-2 viral copies this assay can detect is 138 copies/mL. A negative result does not preclude SARS-Cov-2 infection and should not be used as the sole basis for treatment or other patient management decisions. A negative result may occur with  improper specimen collection/handling, submission of specimen other than nasopharyngeal swab, presence of viral mutation(s) within the areas targeted by this assay, and inadequate number of viral copies(<138 copies/mL). A negative result must be combined with clinical observations, patient history, and epidemiological information. The expected result is Negative.  Fact Sheet for Patients:  EntrepreneurPulse.com.au  Fact Sheet for Healthcare Providers:  IncredibleEmployment.be  This test is no t yet approved or cleared by the [place] FDA and  has been authorized for detection and/or diagnosis of SARS-CoV-2 by FDA under an Emergency Use Authorization  (EUA). This EUA will remain  in effect (meaning this test can be used) for the duration of the COVID-19 declaration under Section 564(b)(1) of the Act, 21 U.S.C.section 360bbb-3(b)(1), unless the authorization is terminated  or revoked sooner.       Influenza A by PCR NEGATIVE NEGATIVE Final   Influenza B by PCR NEGATIVE NEGATIVE Final    Comment: (NOTE) The Xpert Xpress SARS-CoV-2/FLU/RSV plus assay is intended as an aid in the diagnosis of influenza from Nasopharyngeal swab specimens and should not be used as a sole basis for treatment. Nasal washings and aspirates are unacceptable for Xpert Xpress SARS-CoV-2/FLU/RSV testing.  Fact Sheet for  Patients: EntrepreneurPulse.com.au  Fact Sheet for Healthcare Providers: IncredibleEmployment.be  This test is not yet approved or cleared by the [place] FDA and has been authorized for detection and/or diagnosis of SARS-CoV-2 by FDA under an Emergency Use Authorization (EUA). This EUA will remain in effect (meaning this test can be used) for the duration of the COVID-19 declaration under Section 564(b)(1) of the Act, 21 U.S.C. section 360bbb-3(b)(1), unless the authorization is terminated or revoked.  Performed at [facility], [address], [place], [place] [address]          Radiology Studies: DG Chest 2 View  Result Date: 01/26/2020 CLINICAL DATA:  Shortness of breath EXAM: CHEST - 2 VIEW COMPARISON:  December 07, 2019 FINDINGS: The mediastinal contour and cardiac silhouette are stable. Cardiac pacemaker is unchanged. The heart size is enlarged. Diffuse increased pulmonary interstitium is identified bilaterally. There is no pleural effusion or focal pneumonia. The bony structures are stable. IMPRESSION: Congestive heart failure. Electronically Signed   By: Abelardo Diesel M.D.   On: 01/26/2020 15:51   DG Knee 2 Views Right  Result Date: 01/26/2020 CLINICAL DATA:  Bilateral leg  weakness EXAM: RIGHT KNEE - 1-2 VIEW COMPARISON:  None. FINDINGS: Degenerative changes with joint space narrowing and spurring most pronounced in the patellofemoral compartment. Small joint effusion. No acute bony abnormality. Specifically, no fracture, subluxation, or dislocation. Dense vascular calcifications. IMPRESSION: Degenerative changes as above. Small joint effusion. No acute bony abnormality. Electronically Signed   By: Rolm Baptise M.D.   On: 01/26/2020 14:02        Scheduled Meds: . furosemide  60 mg Intravenous BID  . hydrALAZINE  25 mg Oral BID  . insulin aspart  0-6 Units Subcutaneous TID WC  . methylPREDNISolone (SOLU-MEDROL) injection  60 mg Intravenous Daily  . tamsulosin  0.4 mg Oral QPC supper  . cyanocobalamin  1,000 mcg Oral Daily   Continuous Infusions:   LOS: 1 day    Time spent: 35 mins.More than 50% of that time was spent in counseling and/or coordination of care.      Shelly Coss, MD [facility] P1/03/2020, 7:58 AM

## 2020-01-27 NOTE — Telephone Encounter (Signed)
Returned the call to the patient's wife. She wanted to call and let Elnita Maxwell know that the patient was currently at [facility]. Appointment for tomorrow has been canceled.

## 2020-01-27 NOTE — Telephone Encounter (Signed)
Spoke to patient's wife she wanted Dr.Jordan to know husband was admitted to [facility].Stated he had another blood transfusion.Stated his Eliquis was decreased to 2.5 mg.He is sob having labored breathing.Advised I will make Dr.Jordan aware.

## 2020-01-27 NOTE — TOC Initial Note (Signed)
Transition of Care [facility]) - Initial/Assessment Note    Patient Details  Name: Evan [name] MRN: [id number] Date of Birth: 11-16-1940  [facility]) CM/SW Contact:    Magnus Ivan, LCSW Phone Number: 01/27/2020, 11:12 AM  Clinical Narrative:                CSW met with patient at bedside for Readmission Screen. Patient lives with wife who takes him to appointments. Pharmacy is [facility]. Unsure of PCP but says he sees Dr. [name] for [place]. Patient has home o2. Patient is active with [facility] for PT and RN serivces, CSW confirmed with Corene Cornea. No SNF history.    Expected Discharge Plan: [place] Barriers to Discharge: Continued Medical Work up   Patient Goals and CMS Choice Patient states their goals for this hospitalization and ongoing recovery are:: return home with home health CMS Medicare.gov Compare Post Acute Care list provided to:: Patient Choice offered to / list presented to : Patient  Expected Discharge Plan and Services Expected Discharge Plan: [place]       Living arrangements for the past 2 months: Single Family Home                                      Prior Living Arrangements/Services Living arrangements for the past 2 months: Single Family Home Lives with:: Spouse Patient language and need for interpreter reviewed:: Yes Do you feel safe going back to the place where you live?: Yes      Need for Family Participation in Patient Care: Yes (Comment) Care giver support system in place?: Yes (comment) Current home services: DME,Home PT,Home RN Criminal Activity/Legal Involvement Pertinent to Current Situation/Hospitalization: No - Comment as needed  Activities of Daily Living      Permission Sought/Granted Permission sought to share information with : Chartered certified accountant granted to share information with : Yes, Verbal Permission Granted     Permission  granted to share info w AGENCY: [facility], DME agencies as needed        Emotional Assessment       Orientation: : Oriented to Self,Oriented to Place,Oriented to  Time,Oriented to Situation Alcohol / Substance Use: Not Applicable Psych Involvement: No (comment)  Admission diagnosis:  Acute on chronic diastolic heart failure (HCC) [I50.33] Peripheral edema [R60.9] Weakness [R53.1] Gout, unspecified cause, unspecified chronicity, unspecified site [M10.9] Patient Active Problem List   Diagnosis Date Noted  . Effusion of right knee 01/27/2020  . Acute on chronic systolic CHF (congestive heart failure) ([place]) 01/27/2020  . Acute on chronic diastolic heart failure ([place]) 01/26/2020  . Goals of care, counseling/discussion 12/17/2019  . MDS (myelodysplastic syndrome), low grade ([place]) 12/16/2019  . Acute on chronic heart failure with preserved ejection fraction (HFpEF) ([place]) 12/10/2019  . Acute renal failure superimposed on stage 3 chronic kidney disease ([place]) 12/10/2019  . Cirrhosis ([place])   . Symptomatic anemia 12/07/2019  . Macrocytic anemia 04/16/2019  . S/P TAVR (transcatheter aortic valve replacement)   . Bronchitis 04/17/2017  . Severe aortic stenosis 03/23/2017  . Atrial fibrillation, permanent ([place]) 03/23/2017  . Peripheral vision loss, bilateral   . Osteoarthritis   . Hypertension   . HOH (hard of hearing)   . Heart murmur   . Frequency of urination   . Dyspnea   . Bruises easily   . Diabetes  mellitus type 2, uncomplicated ([place]) 67/01/1001  . Essential hypertension 10/15/2015  . 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 Diarrhea 08/24/2015  . Stroke ([place]) 07/25/2015  . Edema of leg 03/20/2015  . Bradycardia 03/20/2015  . Nocturia 10/24/2014  . Atrial fibrillation ([place]) 02/20/2013  . Aftercare following surgery of the circulatory system, [place] 06/07/2012  . Occlusion and stenosis of carotid artery without mention of cerebral  infarction 04/14/2011  . Shoulder pain 04/14/2011  . 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 Diabetes mellitus ([place]) 02/01/2011  . Coronary artery disease   . Hyperlipidemia   . Hypertensive urgency   . Obesity   . Carotid arterial disease ([place])   . Torn rotator cuff 01/24/2010   PCP:  Leonel Ramsay, MD Pharmacy:   [facility], [place] - [address] AT [place]. [place] [address] Phone: [phone] Fax: [phone]  [facility], [place] - [address] [place] [address] Phone: [phone] Fax: [phone]     Social Determinants of Health ([place]) Interventions    Readmission Risk Interventions Readmission Risk Prevention Plan 01/27/2020  Transportation Screening Complete  PCP or Specialist Appt within 3-5 Days Complete  HRI or [place] Complete  Social Work Consult for [place] Planning/Counseling Complete  Palliative Care Screening Not Applicable  Medication Review Press photographer) Complete  Some recent data might be hidden

## 2020-01-28 DIAGNOSIS — I5023 Acute on chronic systolic (congestive) heart failure: Secondary | ICD-10-CM | POA: Diagnosis not present

## 2020-01-28 LAB — COMPREHENSIVE METABOLIC PANEL
ALT: 128 U/L — ABNORMAL HIGH (ref 0–44)
ALT: 122 U/L — ABNORMAL HIGH (ref 0–44)
AST: 68 U/L — ABNORMAL HIGH (ref 15–41)
AST: 59 U/L — ABNORMAL HIGH (ref 15–41)
Albumin: 3.7 g/dL (ref 3.5–5.0)
Albumin: 3.6 g/dL (ref 3.5–5.0)
Alkaline Phosphatase: 102 U/L (ref 38–126)
Alkaline Phosphatase: 98 U/L (ref 38–126)
Anion gap: 15 (ref 5–15)
Anion gap: 15 (ref 5–15)
BUN: 83 mg/dL — ABNORMAL HIGH (ref 8–23)
BUN: 82 mg/dL — ABNORMAL HIGH (ref 8–23)
CO2: 27 mmol/L (ref 22–32)
CO2: 27 mmol/L (ref 22–32)
Calcium: 9 mg/dL (ref 8.9–10.3)
Calcium: 8.9 mg/dL (ref 8.9–10.3)
Chloride: 95 mmol/L — ABNORMAL LOW (ref 98–111)
Chloride: 94 mmol/L — ABNORMAL LOW (ref 98–111)
Creatinine, Ser: 2.41 mg/dL — ABNORMAL HIGH (ref 0.61–1.24)
Creatinine, Ser: 2.38 mg/dL — ABNORMAL HIGH (ref 0.61–1.24)
GFR, Estimated: 27 mL/min — ABNORMAL LOW (ref >60)
GFR, Estimated: 27 mL/min — ABNORMAL LOW (ref >60)
Glucose, Bld: 167 mg/dL — ABNORMAL HIGH (ref 70–99)
Glucose, Bld: 194 mg/dL — ABNORMAL HIGH (ref 70–99)
Potassium: 4.3 mmol/L (ref 3.5–5.1)
Potassium: 4.4 mmol/L (ref 3.5–5.1)
Sodium: 137 mmol/L (ref 135–145)
Sodium: 136 mmol/L (ref 135–145)
Total Bilirubin: 5.1 mg/dL — ABNORMAL HIGH (ref 0.3–1.2)
Total Bilirubin: 5 mg/dL — ABNORMAL HIGH (ref 0.3–1.2)
Total Protein: 6.8 g/dL (ref 6.5–8.1)
Total Protein: 6.7 g/dL (ref 6.5–8.1)

## 2020-01-28 LAB — TYPE AND SCREEN
ABO/RH(D): A POS
Antibody Screen: NEGATIVE
Unit division: 0

## 2020-01-28 LAB — HEPATITIS PANEL, ACUTE
HCV Ab: NONREACTIVE
Hep A IgM: NONREACTIVE
Hep B C IgM: NONREACTIVE
Hepatitis B Surface Ag: NONREACTIVE

## 2020-01-28 LAB — CBC WITH DIFFERENTIAL/PLATELET
Abs Immature Granulocytes: 1.03 10*3/uL — ABNORMAL HIGH (ref 0.00–0.07)
Basophils Absolute: 0.1 10*3/uL (ref 0.0–0.1)
Basophils Relative: 1 %
Eosinophils Absolute: 0.3 10*3/uL (ref 0.0–0.5)
Eosinophils Relative: 4 %
HCT: 23.3 % — ABNORMAL LOW (ref 39.0–52.0)
Hemoglobin: 7.6 g/dL — ABNORMAL LOW (ref 13.0–17.0)
Immature Granulocytes: 14 %
Lymphocytes Relative: 6 %
Lymphs Abs: 0.4 10*3/uL — ABNORMAL LOW (ref 0.7–4.0)
MCH: 32.8 pg (ref 26.0–34.0)
MCHC: 32.6 g/dL (ref 30.0–36.0)
MCV: 100.4 fL — ABNORMAL HIGH (ref 80.0–100.0)
Monocytes Absolute: 0.3 10*3/uL (ref 0.1–1.0)
Monocytes Relative: 4 %
Neutro Abs: 5.3 10*3/uL (ref 1.7–7.7)
Neutrophils Relative %: 71 %
Platelets: 45 10*3/uL — ABNORMAL LOW (ref 150–400)
RBC: 2.32 MIL/uL — ABNORMAL LOW (ref 4.22–5.81)
RDW: 23 % — ABNORMAL HIGH (ref 11.5–15.5)
Smear Review: DECREASED
WBC: 7.3 10*3/uL (ref 4.0–10.5)
nRBC: 1 % — ABNORMAL HIGH (ref 0.0–0.2)

## 2020-01-28 LAB — GLUCOSE, CAPILLARY
Glucose-Capillary: 158 mg/dL — ABNORMAL HIGH (ref 70–99)
Glucose-Capillary: 168 mg/dL — ABNORMAL HIGH (ref 70–99)
Glucose-Capillary: 212 mg/dL — ABNORMAL HIGH (ref 70–99)
Glucose-Capillary: 220 mg/dL — ABNORMAL HIGH (ref 70–99)

## 2020-01-28 LAB — LACTATE DEHYDROGENASE: LDH: 261 U/L — ABNORMAL HIGH (ref 98–192)

## 2020-01-28 LAB — RETICULOCYTES
Immature Retic Fract: 22.8 % — ABNORMAL HIGH (ref 2.3–15.9)
RBC.: 2.28 MIL/uL — ABNORMAL LOW (ref 4.22–5.81)
Retic Count, Absolute: 72 10*3/uL (ref 19.0–186.0)
Retic Ct Pct: 3.2 % — ABNORMAL HIGH (ref 0.4–3.1)

## 2020-01-28 LAB — URIC ACID: Uric Acid, Serum: 17.4 mg/dL — ABNORMAL HIGH (ref 3.7–8.6)

## 2020-01-28 LAB — BILIRUBIN, FRACTIONATED(TOT/DIR/INDIR)
Bilirubin, Direct: 2.4 mg/dL — ABNORMAL HIGH (ref 0.0–0.2)
Indirect Bilirubin: 2.8 mg/dL — ABNORMAL HIGH (ref 0.3–0.9)
Total Bilirubin: 5.2 mg/dL — ABNORMAL HIGH (ref 0.3–1.2)

## 2020-01-28 LAB — BPAM RBC
Blood Product Expiration Date: 202201272359
ISSUE DATE / TIME: 202201031300
Unit Type and Rh: 6200

## 2020-01-28 LAB — FOLATE: Folate: 8.4 ng/mL (ref >5.9)

## 2020-01-28 LAB — PHOSPHORUS: Phosphorus: 6.1 mg/dL — ABNORMAL HIGH (ref 2.5–4.6)

## 2020-01-28 LAB — VITAMIN B12: Vitamin B-12: 5466 pg/mL — ABNORMAL HIGH (ref 180–914)

## 2020-01-28 MED ORDER — LIDOCAINE 5 % EX PTCH
1.0000 | MEDICATED_PATCH | CUTANEOUS | Status: DC
  Administered 2020-01-28 – 2020-02-03 (×7): 1 via TRANSDERMAL
  Filled 2020-01-28 (×8): qty 1

## 2020-01-28 MED ORDER — ALLOPURINOL 100 MG PO TABS
50.0000 mg | ORAL_TABLET | ORAL | Status: DC
  Administered 2020-01-28: 15:00:00 50 mg via ORAL
  Filled 2020-01-28: qty 1

## 2020-01-28 MED ORDER — PREDNISONE 50 MG PO TABS
50.0000 mg | ORAL_TABLET | Freq: Every day | ORAL | Status: DC
  Administered 2020-01-28 – 2020-02-04 (×8): 50 mg via ORAL
  Filled 2020-01-28 (×8): qty 1

## 2020-01-28 MED ORDER — HYDROCODONE-ACETAMINOPHEN 7.5-325 MG PO TABS
1.0000 | ORAL_TABLET | Freq: Four times a day (QID) | ORAL | Status: DC | PRN
  Administered 2020-01-29: 1 via ORAL
  Filled 2020-01-28 (×2): qty 1

## 2020-01-28 MED ORDER — CALCIUM ACETATE (PHOS BINDER) 667 MG PO CAPS
667.0000 mg | ORAL_CAPSULE | Freq: Three times a day (TID) | ORAL | Status: DC
  Administered 2020-01-28 – 2020-02-04 (×22): 667 mg via ORAL
  Filled 2020-01-28 (×23): qty 1

## 2020-01-28 NOTE — TOC Progression Note (Signed)
Transition of Care [facility]) - Progression Note    Patient Details  Name: MYKAH SHIN MRN: [id number] Date of Birth: Dec 16, 1940  Transition of Care [facility]) CM/SW Contact  Liliana Cline, LCSW Phone Number: 01/28/2020, 3:36 PM  Clinical Narrative:   Spoke to patient's wife about PT recommending SNF. She was with patient at bedside and reported she and patient do not want SNF rehab. Would like patient to return home with Home Health. Updated MD. Wife asking about BSC/raised toilet seat. Explained 3 in 1 and she is agreeable to CSW ordering one, no agency preference. Asked MD to place orders for Home Health and 3 in 1. Referral to [employer] Representative Elease Hashimoto for 3 in 1.  Expected Discharge Plan: Home w Home Health Services Barriers to Discharge: Continued Medical Work up  Expected Discharge Plan and Services Expected Discharge Plan: Home w Home Health Services       Living arrangements for the past 2 months: Single Family Home                                       Social Determinants of Health (SDOH) Interventions    Readmission Risk Interventions Readmission Risk Prevention Plan 01/27/2020  Transportation Screening Complete  PCP or Specialist Appt within 3-5 Days Complete  HRI or Home Care Consult Complete  Social Work Consult for [place] Care Planning/Counseling Complete  Palliative Care Screening Not Applicable  Medication Review Oceanographer) Complete  Some recent data might be hidden

## 2020-01-28 NOTE — Consult Note (Addendum)
Hematology/Oncology Consult note [facility] [phone] [phone]  Patient Care Team: Mick Sell, MD as PCP - General (Infectious Diseases) [place], [name] M, MD as PCP - Cardiology (Cardiology) Regan Lemming, MD as PCP - Electrophysiology (Cardiology) Earna Coder, MD as Consulting Physician (Hematology and Oncology)   Name of the patient: Evan [name]  [id number]  January 20, 1941    Reason for consult: Anemia   Requesting physician: Dr. Renford Dills  Date of visit: 01/28/2020    History of presenting illness- Patient is a 80 year old male who sees Dr. Donneta Romberg as an outpatient and has a diagnosis of low-grade MDS.  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  His other medical problems include chronic systolic heart failure with an EF of 45 to 50%, severe aortic stenosis s/p TAVR in April 2019, A. fib on Eliquis, stage III CKD.  He presented to the ER with bilateral lower extremity swelling.  He is being treated for Congestive heart failure.  With regards to his anemia patient's hemoglobin has been between 6-8 ever since he was diagnosed with MDS in November 2021.  White cell count and platelets have been normal up until recent hospitalization.  Presently his platelet counts are between 40s to 60s.Back in November 2029 patient's bilirubin was between 2-3 and presently is elevated at 5.  AST ALT also elevated at 59 and 122 respectively.  Patient also has baseline cirrhosis as noted on imaging in November 2021.  Patient has significant fatigue presently.  Unable to give elaborate history   ECOG PS- 3  Pain scale- 0   Review of systems- Review of Systems  Constitutional: Positive for malaise/fatigue. Negative for chills, fever and weight loss.  HENT: Negative for congestion, ear discharge and nosebleeds.   Eyes: Negative for blurred vision.  Respiratory: Negative for cough,  hemoptysis, sputum production, shortness of breath and wheezing.   Cardiovascular: Positive for leg swelling. Negative for chest pain, palpitations, orthopnea and claudication.  Gastrointestinal: Negative for abdominal pain, blood in stool, constipation, diarrhea, heartburn, melena, nausea and vomiting.  Genitourinary: Negative for dysuria, flank pain, frequency, hematuria and urgency.  Musculoskeletal: Negative for back pain, joint pain and myalgias.  Skin: Negative for rash.  Neurological: Negative for dizziness, tingling, focal weakness, seizures, weakness and headaches.  Endo/Heme/Allergies: Does not bruise/bleed easily.  Psychiatric/Behavioral: Negative for depression and suicidal ideas. The patient does not have insomnia.     Allergies  Allergen Reactions  . Niacin Other (See Comments) and Rash    Red face and burns.  . Niacin And Related Rash and Other (See Comments)    Red face and burns.  . Metformin And Related Diarrhea  . Statins Other (See Comments)    Myalgias, weakness    Patient Active Problem List   Diagnosis Date Noted  . Effusion of right knee 01/27/2020  . Acute on chronic systolic CHF (congestive heart failure) (HCC) 01/27/2020  . Acute on chronic diastolic heart failure (HCC) 01/26/2020  . Goals of care, counseling/discussion 12/17/2019  . MDS (myelodysplastic syndrome), low grade (HCC) 12/16/2019  . Acute on chronic heart failure with preserved ejection fraction (HFpEF) (HCC) 12/10/2019  . Acute renal failure superimposed on stage 3 chronic kidney disease (HCC) 12/10/2019  . Cirrhosis (HCC)   . Symptomatic anemia 12/07/2019  . Macrocytic anemia 04/16/2019  . S/P TAVR (transcatheter aortic valve replacement)   . Bronchitis 04/17/2017  . Severe aortic stenosis 03/23/2017  .  Atrial fibrillation, permanent (HCC) 03/23/2017  . Peripheral vision loss, bilateral   . Osteoarthritis   . Hypertension   . HOH (hard of hearing)   . Heart murmur   . Frequency of  urination   . Dyspnea   . Bruises easily   . Diabetes mellitus type 2, uncomplicated (HCC) 10/21/2015  . Essential hypertension 10/15/2015  . 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 Diarrhea 08/24/2015  . Stroke (HCC) 07/25/2015  . Edema of leg 03/20/2015  . Bradycardia 03/20/2015  . Nocturia 10/24/2014  . Atrial fibrillation (HCC) 02/20/2013  . Aftercare following surgery of the circulatory system, NEC 06/07/2012  . Occlusion and stenosis of carotid artery without mention of cerebral infarction 04/14/2011  . Shoulder pain 04/14/2011  . 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 Diabetes mellitus (HCC) 02/01/2011  . Coronary artery disease   . Hyperlipidemia   . Hypertensive urgency   . Obesity   . Carotid arterial disease (HCC)   . Torn rotator cuff 01/24/2010     Past Medical History:  Diagnosis Date  . Anemia   . Carotid arterial disease (HCC)   . CHF (congestive heart failure) (HCC)   . Coronary artery disease   . Hearing loss   . HOH (hard of hearing)   . Hyperlipidemia   . Hypertension   . Nocturia   . Obesity   . Osteoarthritis    "BUE; BLE; right knee" (03/23/2017)  . Peripheral vision loss, bilateral    S/P 07/2015  . Permanent atrial fibrillation (HCC)   . 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 Severe aortic stenosis   . Sinus drainage   . Stroke [facility]) 07/2015   "peripheral vision still bad out of left eye" (03/23/2017)  . Torn rotator cuff 2012   Right arm  . Type II diabetes mellitus (HCC)   . Urinary frequency      Past Surgical History:  Procedure Laterality Date  . BACK SURGERY    . CARDIAC CATHETERIZATION  11/29/2005   SEVERE THREE VESSEL OBSTRUCTIVE ATHERSCLEROTIC CAD. ALL GRAFTS WERE PATENT. EF 65%  . CARDIAC CATHETERIZATION  03/23/2017  . CAROTID ENDARTERECTOMY Right 01-29-08   cea  . CARPAL TUNNEL RELEASE Right   . COLONOSCOPY N/A  12/10/2019   Procedure: COLONOSCOPY;  Surgeon: Charlott Rakes, MD;  Location: [facility] ENDOSCOPY;  Service: Endoscopy;  Laterality: N/A;  . CORONARY ARTERY BYPASS GRAFT  03/2003   X3. LIMA GRAFT TO THE LAD, SAPHENOUS VEIN GRAFT TO THE PA, AND A LEFT RADIAL GRAFT TO THEOBTUSE MARGINAL VESSEL  . ENDARTERECTOMY Left 05/28/2012   Procedure: ENDARTERECTOMY CAROTID;  Surgeon: Sherren Kerns, MD;  Location: [facility] OR;  Service: Vascular;  Laterality: Left;  . ESOPHAGOGASTRODUODENOSCOPY N/A 12/10/2019   Procedure: ESOPHAGOGASTRODUODENOSCOPY (EGD);  Surgeon: Charlott Rakes, MD;  Location: [facility] ENDOSCOPY;  Service: Endoscopy;  Laterality: N/A;  . HEMOSTASIS CLIP PLACEMENT  12/10/2019   Procedure: HEMOSTASIS CLIP PLACEMENT;  Surgeon: Charlott Rakes, MD;  Location: [facility] ENDOSCOPY;  Service: Endoscopy;;  . INSERT / REPLACE / REMOVE PACEMAKER  03/23/2017  . LUMBAR DISC SURGERY  1975; 1984  . MOLE SURGERY     "? face/arms"  . PACEMAKER IMPLANT N/A 03/23/2017   Procedure: PACEMAKER IMPLANT;  Surgeon: Regan Lemming, MD;  Location: [facility] INVASIVE CV LAB;  Service: Cardiovascular;  Laterality: N/A;  . PATCH ANGIOPLASTY Left 05/28/2012   Procedure: WITH dACRON PATCH ANGIOPLASTY;  Surgeon: Leonette Most  Antony Blackbird, MD;  Location: [place];  Service: Vascular;  Laterality: Left;  . POLYPECTOMY  12/10/2019   Procedure: POLYPECTOMY;  Surgeon: Wilford Corner, MD;  Location: [facility] ENDOSCOPY;  Service: Endoscopy;;  . RIGHT/LEFT HEART CATH AND CORONARY/GRAFT ANGIOGRAPHY N/A 03/23/2017   Procedure: RIGHT/LEFT HEART CATH AND CORONARY/GRAFT ANGIOGRAPHY;  Surgeon: Sherren Mocha, MD;  Location: [place] CV LAB;  Service: Cardiovascular;  Laterality: N/A;  . TEE WITHOUT CARDIOVERSION N/A 05/09/2017   Procedure: TRANSESOPHAGEAL ECHOCARDIOGRAM (TEE);  Surgeon: Sherren Mocha, MD;  Location: [place];  Service: Open Heart Surgery;  Laterality: N/A;  . TRANSCATHETER AORTIC VALVE REPLACEMENT, TRANSFEMORAL N/A 05/09/2017   Procedure:  TRANSCATHETER AORTIC VALVE REPLACEMENT, TRANSFEMORAL using an Edwards 48mm Sapien 3 Aortic Valve;  Surgeon: Sherren Mocha, MD;  Location: [place];  Service: Open Heart Surgery;  Laterality: N/A;    Social History   Socioeconomic History  . Marital status: Married    Spouse name: Not on file  . Number of children: 5  . Years of education: Not on file  . Highest education level: Not on file  Occupational History  . Occupation: retired    Fish farm manager: RETIRED  Tobacco Use  . Smoking status: Former Smoker    Packs/day: 2.00    Years: 39.00    Pack years: 78.00    Types: Cigarettes    Quit date: 01/24/1994    Years since quitting: 26.0  . Smokeless tobacco: Never Used  Vaping Use  . Vaping Use: Never used  Substance and Sexual Activity  . Alcohol use: No  . Drug use: No  . Sexual activity: Never  Other Topics Concern  . Not on file  Social History Narrative   On Farm/ in [place]; no alcohol; quit smoking- 96; worked in Estate agent.    Social Determinants of Health   Financial Resource Strain: Not on file  Food Insecurity: Not on file  Transportation Needs: Not on file  Physical Activity: Not on file  Stress: Not on file  Social Connections: Not on file  Intimate Partner Violence: Not on file     Family History  Problem Relation Age of Onset  . Heart attack Father   . Heart attack Brother   . Parkinsonism Brother   . Cancer Brother   . Arrhythmia Brother      Current Facility-Administered Medications:  .  acetaminophen (TYLENOL) tablet 650 mg, 650 mg, Oral, Q6H PRN, 650 mg at 01/28/20 0840 **OR** acetaminophen (TYLENOL) suppository 650 mg, 650 mg, Rectal, Q6H PRN, Howerter, Justin B, DO .  apixaban (ELIQUIS) tablet 2.5 mg, 2.5 mg, Oral, BID, Adhikari, Amrit, MD, 2.5 mg at 01/28/20 1059 .  calcium acetate (PHOSLO) capsule 667 mg, 667 mg, Oral, TID WC, Adhikari, Amrit, MD, 667 mg at 01/28/20 1253 .  furosemide (LASIX) injection 60 mg, 60 mg, Intravenous,  BID, Howerter, Justin B, DO, 60 mg at 01/28/20 0840 .  hydrALAZINE (APRESOLINE) tablet 25 mg, 25 mg, Oral, BID, Howerter, Justin B, DO, 25 mg at 01/28/20 1101 .  HYDROcodone-acetaminophen (NORCO) 7.5-325 MG per tablet 1 tablet, 1 tablet, Oral, Q6H PRN, Adhikari, Amrit, MD .  insulin aspart (novoLOG) injection 0-6 Units, 0-6 Units, Subcutaneous, TID WC, Howerter, Justin B, DO, 1 Units at 01/28/20 1253 .  lenalidomide (REVLIMID) capsule 5 mg, 5 mg, Oral, Daily, Adhikari, Amrit, MD, 5 mg at 01/28/20 1059 .  lidocaine (LIDODERM) 5 % 1 patch, 1 patch, Transdermal, Q24H, Adhikari, Amrit, MD .  naloxone (NARCAN) injection 0.4 mg, 0.4 mg,  Intravenous, PRN, Shelly Coss, MD, 0.4 mg at 01/27/20 1903 .  predniSONE (DELTASONE) tablet 50 mg, 50 mg, Oral, Q breakfast, Adhikari, Amrit, MD, 50 mg at 01/28/20 0840 .  prochlorperazine (COMPAZINE) tablet 10 mg, 10 mg, Oral, Q6H PRN, Howerter, Justin B, DO .  tamsulosin (FLOMAX) capsule 0.4 mg, 0.4 mg, Oral, QPC supper, Howerter, Justin B, DO, 0.4 mg at 01/27/20 1657 .  vitamin B-12 (CYANOCOBALAMIN) tablet 1,000 mcg, 1,000 mcg, Oral, Daily, Howerter, Justin B, DO, 1,000 mcg at 01/28/20 1101   Physical exam:  Vitals:   01/28/20 0539 01/28/20 0805 01/28/20 1059 01/28/20 1203  BP: (!) 146/62 140/66 (!) 148/63 (!) 141/60  Pulse: 64 72 61 67  Resp: 17 19  19   Temp: 98 F (36.7 C) 98 F (36.7 C)  97.8 F (36.6 C)  TempSrc:      SpO2: 94% 94%  92%  Weight: 253 lb 8 oz (115 kg)     Height:       Physical Exam Constitutional:      Comments: Appears fatigued  Eyes:     Extraocular Movements: EOM normal.     Comments: Scleral icterus  Cardiovascular:     Rate and Rhythm: Normal rate and regular rhythm.     Heart sounds: Normal heart sounds.  Pulmonary:     Effort: Pulmonary effort is normal.     Breath sounds: Normal breath sounds.  Abdominal:     General: Bowel sounds are normal.     Palpations: Abdomen is soft.  Musculoskeletal:     Right lower  leg: Edema present.     Left lower leg: Edema present.  Skin:    General: Skin is warm and dry.  Neurological:     Mental Status: He is alert and oriented to person, place, and time.        CMP Latest Ref Rng & Units 01/28/2020  Glucose 70 - 99 mg/dL 194(H)  BUN 8 - 23 mg/dL 82(H)  Creatinine 0.61 - 1.24 mg/dL 2.38(H)  Sodium 135 - 145 mmol/L 136  Potassium 3.5 - 5.1 mmol/L 4.4  Chloride 98 - 111 mmol/L 94(L)  CO2 22 - 32 mmol/L 27  Calcium 8.9 - 10.3 mg/dL 8.9  Total Protein 6.5 - 8.1 g/dL 6.7  Total Bilirubin 0.3 - 1.2 mg/dL 5.0(H)  Alkaline Phos 38 - 126 U/L 98  AST 15 - 41 U/L 59(H)  ALT 0 - 44 U/L 122(H)   CBC Latest Ref Rng & Units 01/28/2020  WBC 4.0 - 10.5 K/uL 7.3  Hemoglobin 13.0 - 17.0 g/dL 7.6(L)  Hematocrit 39.0 - 52.0 % 23.3(L)  Platelets 150 - 400 K/uL 45(L)    @IMAGES @  DG Chest 2 View  Result Date: 01/26/2020 CLINICAL DATA:  Shortness of breath EXAM: CHEST - 2 VIEW COMPARISON:  December 07, 2019 FINDINGS: The mediastinal contour and cardiac silhouette are stable. Cardiac pacemaker is unchanged. The heart size is enlarged. Diffuse increased pulmonary interstitium is identified bilaterally. There is no pleural effusion or focal pneumonia. The bony structures are stable. IMPRESSION: Congestive heart failure. Electronically Signed   By: Abelardo Diesel M.D.   On: 01/26/2020 15:51   DG Knee 2 Views Right  Result Date: 01/26/2020 CLINICAL DATA:  Bilateral leg weakness EXAM: RIGHT KNEE - 1-2 VIEW COMPARISON:  None. FINDINGS: Degenerative changes with joint space narrowing and spurring most pronounced in the patellofemoral compartment. Small joint effusion. No acute bony abnormality. Specifically, no fracture, subluxation, or dislocation. Dense vascular calcifications. IMPRESSION: Degenerative  changes as above. Small joint effusion. No acute bony abnormality. Electronically Signed   By: Rolm Baptise M.D.   On: 01/26/2020 14:02    Assessment and plan- Patient is a 80 y.o.  male with low-grade MDS and multiple comorbidities admitted for congestive heart failure.  Hematology consulted for anemia  Anemia: Likely secondary to MDS.  Patient's baseline hemoglobin since the diagnosis of MDS has been around 6-8.  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  He has received 1 unit of PRBC transfusion as an outpatient on 01/20/2020 as well as yesterday.  He has responded adequately with a hemoglobin that went up from 6.6 to 7.6  I will again do a complete anemia work-up including  B12 and folate, haptoglobin, LDH, reticulocyte count and fractionated bilirubin.  I am not adding iron studies since patient recently received blood transfusion.  LDH only mildly elevated at 261 which is not specific and not indicative of hemolysis necessarily.  Reticulocyte count is mildly elevated at 3.2% which is low for the degree of anemia indicating a hypoproliferative anemia rather than hemolysis  Thrombocytopenia: Likely due to Revlimid.  He had a baseline normal platelet count prior to starting Revlimid.  I will be holding this drug while he is inpatien  Hyperbilirubinemia: He does have hyperbilirubinemia but fractionated bilirubin shows elevation of both direct and indirect bilirubin which is not an entirely favor hemolysis.   Revlimid can cause hyperbilirubinemia in about 1% of cases.  He also has baseline cirrhosis and his bilirubin is typically around 2-3 at baseline.  Alkaline phosphatase is not elevated that would suggest obstruction either.  Recommend having GI on board and getting a right upper quadrant ultrasound.    Hyperuricemia: Uric acid levels elevated between 14-17 over the last 3 days.  We do not have any prior values for comparison.  Recommend starting allopurinol 50 mg every 48 hours and we will check G6PD levels.  If G6PD levels come back is normal he may benefit from 1 dose of rasburicase.   Phosphorus levels are also elevated at 6.1.   It would be unusual for low-grade MDS to present with tumor lysis and it is unclear what the etiology of  elevated phosphorus and uric acid is and if it secondary to his underlying CKD.  He does not have hypocalcemia or hyperkalemia which is also typically seen with tumor lysis syndrome.  I would recommend having nephrology on board for this  Acute gout: Likely secondary to hyperuricemia: He is currently on steroids for the same.   Visit Diagnosis 1.  Low-grade MDS 2.  Normocytic anemia 3.  Thrombocytopenia  Dr. Randa Evens, MD, MPH [facility] at [facility] [id number] 01/28/2020 4:43 PM

## 2020-01-28 NOTE — Progress Notes (Signed)
PROGRESS NOTE    Evan [name]  [id number] DOB: Jun 11, 1940 DOA: 01/26/2020 PCP: Leonel Ramsay, MD   Chief Complain: Bilateral lower extremity swelling  Brief Narrative: Patient is a 80 year old male with history of chronic systolic heart failure, severe aortic stenosis status post TAVR, permanent A. fib, myelodysplastic syndrome, chronic anemia, type 2 diabetes, hypertension, stage IIIb CKD with a baseline creatinine of 1.6-2.0 who presented with complaints of bilateral lower extremity swelling.He was also complaining of dyspnea, orthopnea.  He also was noticed to have right knee effusion.  Patient was admitted for the management of acute on chronic congestive heart failure and acute gout.  He underwent arthrocentesis of the knee which showed urate crystals.PT recommending SNF on  discharge  Assessment & Plan:   Principal Problem:   Acute on chronic systolic CHF (congestive heart failure) (HCC) Active Problems:   Atrial fibrillation, permanent (HCC)   Diabetes mellitus type 2, uncomplicated (HCC)   Acute renal failure superimposed on stage 3 chronic kidney disease (HCC)   MDS (myelodysplastic syndrome), low grade (HCC)   Effusion of right knee   Acute on chronic systolic congestive heart failure:Echocardiogram done  on 12/08/2019 and showing global hypokinesis, mild concentric LVH, LVEF 45 to 50%, and indeterminate diastolic function.  He also has a history of severe aortic stenosis status post TAVR in April 2019 as well as permanent atrial fibrillation complicated by sick sinus syndrome status post pacemaker placement in February 2019 and chronically anticoagulated on Eliquis.  Follows with Dr. [name] as his outpatient cardiologist.  Dewaine Conger Lasix 80 mg p.o. every morning and 40 mg p.o. nightly at home. He has been started on aggressive diuresis with Lasix IV 60 mg twice a day.  Continue to monitor input and output, daily weight He has severe bilateral lower extremity edema.   Elevated BNP.  Patient is on 2 L of oxygen at home.  Currently on baseline oxygen requirement  AKI on CKD stage IIIb: Most likely secondary to cardiorenal syndrome.  His baseline creatinine ranges from 1.6-2.  Continue diuresis.  Monitor BMP.Improving and kidney function coming close to baseline.  Assess volume status tomorrow to change the IV Lasix to oral if appropriate.  Right knee effusion: Presented with 4 to 5 days history of progressive right knee pain associated with swelling, erythema suggesting inflammatory arthropathy.  Underwent arthrocentesis in the emergency department.  Synovial fluid significant neutrophils suggesting inflammatory arthritis, presence of urate crystals.  He has severely elevated uric acid level.  He has acute gout flare, will not start allopurinol for now,shd consider allopurinol after resolution of acute flare.  Colchicine is contraindicated on the background of CKD.  We will continue slow taper of the steroids of prednisone . Given 60 mg of IV Solu-Medrol on 01/27/19. He needs to follow-up with rheumatology as an outpatient.  Chronic macrocytic anemia/thrombocytopenia: History of myelodysplastic syndrome.  Follows with Dr. Rogue Bussing.  He was a started on Lenalidomide was started on 01/09/2020 as management for his myelodysplastic syndrome.  His MDS appears to be complicated with chronic macrocytic anemia, with baseline hemoglobin of 7-8. He  underwent transfusion of 1 unit PRBC on 01/20/2020. Hemoglobin dropped to 6.6 on 01/27/2020 and he was transfused with unit of PRBC.Hb in the range of 7 today.Monitor CBC  Suspicion for hemolytic anemia/tumor lysis syndrome: Elevated uric acid level, bilirubin, phosphorus.  Will check fractionated bilirubin.  I have consulted  oncology, Dr. Janese Banks .We we will follow-up recommendation  Type diabetes mellitus: Takes glimepiride at home.  Continue sliding-scale insulin here.  Monitor blood sugars  Permanent A. [id number] history of  such and complicated by sick sinus syndrome status post pacemaker placement in February 2019.  On Eliquis for anticoagulation.We will reduce the dose of eliquis to 2.5 BID . currently rate is controlled.  If platelets drops less than 30,000, will stop Eliquis.  BPH:On  Flomax  Hyperphosphatemia: Most likely history with CKD.  Phosphorus of 6.1 today.  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  Will check PTH/intact calcium.  Started on PhosLo.  Elevated liver enzymes/elevated bilirubin: Negative hepatitis panel.  He has chronically elevated bilirubin as per previous records.  As per ultrasound of the right upper quadrant on 11/21 he has cirrhosis of the liver.  Continue monitoring.  Hypertension: Currently blood pressure stable.  Antihypertensives on hold.  Sleepiness/drowsiness: Noted on 01/27/2020.  Most likely secondary to opioids.  Fentanyl discontinued.  Given a dose of Narcan.  Minimize narcotics.  He is much more alert  today  Debility/deconditioning:  PT/OT evaluation.  Pending notices skilled nursing facility on discharge.  TOC consulted            DVT prophylaxis:Eliquis Code Status: Full Family Communication: wife at bedside Status is: Inpatient  Remains inpatient appropriate because:Inpatient level of care appropriate due to severity of illness   Dispo: The patient is from: Home              Anticipated d/c is to: SNF              Anticipated d/c date is: 2-3 days              Patient currently is not medically stable to d/c.=  Consultants: None  Procedures:None  Antimicrobials:  Anti-infectives (From admission, onward)   None      Subjective: Patient seen and examined the bedside this morning.  Hemodynamically stable.  Wife at the bedside.  He says his knee pain has been better since yesterday but is still there.  He still has significant bilateral lower extremity edema.  Objective: Vitals:   01/27/20 1658 01/27/20 2101 01/27/20 2356 01/28/20  0539  BP: 134/70 (!) 149/70 (!) 147/67 (!) 146/62  Pulse: 63 69 66 64  Resp: 20 16 18 17   Temp: (!) 96.9 F (36.1 C) 97.9 F (36.6 C) 98.2 F (36.8 C) 98 F (36.7 C)  TempSrc: Axillary  Oral   SpO2: 95% 94% 92% 94%  Weight:    115 kg  Height:        Intake/Output Summary (Last 24 hours) at 01/28/2020 0742 Last data filed at 01/28/2020 [id number] Gross per 24 hour  Intake 812 ml  Output 1050 ml  Net -238 ml   Filed Weights   01/26/20 1245 01/28/20 0539  Weight: 112.4 kg 115 kg    Examination:   General exam: Obese, chronically looking, weak Respiratory system: Bilateral diminished air sounds on bases, no wheezes or crackles  Cardiovascular system: S1 & S2 heard, RRR. No JVD, murmurs, rubs, gallops or clicks. Gastrointestinal system: Abdomen is nondistended, soft and nontender. No organomegaly or masses felt. Normal bowel sounds heard. Central nervous system: Alert and oriented. No focal neurological deficits. Extremities: 2-3+ bilateral lower extremity pitting edema, no clubbing ,no cyanosis.  Tender right knee Skin: No rashes, lesions or ulcers,no icterus ,no pallor     Data Reviewed: I have personally reviewed following labs and imaging studies  CBC: Recent Labs  Lab 01/26/20 1300 01/27/20 0718 01/28/20 0525  WBC 7.6 7.8  7.3  NEUTROABS  --   --  5.3  HGB 7.1* 6.6* 7.6*  HCT 22.9* 21.1* 23.3*  MCV 104.1* 106.0* 100.4*  PLT 63* 50* 45*   Basic Metabolic Panel: Recent Labs  Lab 01/26/20 1300 01/26/20 1400 01/27/20 0718 01/28/20 0525  NA 134*  --  135 137  K 5.1  --  5.1 4.3  CL 94*  --  96* 95*  CO2 26  --  23 27  GLUCOSE 150*  --  196* 167*  BUN 45*  --  64* 83*  CREATININE 2.35*  --  2.60* 2.41*  CALCIUM 9.0  --  9.0 9.0  MG 2.3  --  2.5*  --   PHOS  --  4.7* 5.6* 6.1*   GFR: Estimated Creatinine Clearance: 33.1 mL/min (A) (by C-G formula based on SCr of 2.41 mg/dL (H)). Liver Function Tests: Recent Labs  Lab 01/26/20 1300 01/27/20 0718  01/28/20 0525  AST 26 133* 68*  ALT 24 112* 128*  ALKPHOS 132* 112 102  BILITOT 5.7* 5.5* 5.1*  PROT 7.1 6.9 6.8  ALBUMIN 3.9 3.6 3.7   No results for input(s): LIPASE, AMYLASE in the last 168 hours. No results for input(s): AMMONIA in the last 168 hours. Coagulation Profile: No results for input(s): INR, PROTIME in the last 168 hours. Cardiac Enzymes: No results for input(s): CKTOTAL, CKMB, CKMBINDEX, TROPONINI in the last 168 hours. BNP (last 3 results) Recent Labs    12/06/19 1633  PROBNP 9,423*   HbA1C: No results for input(s): HGBA1C in the last 72 hours. CBG: Recent Labs  Lab 01/26/20 1606 01/27/20 0735 01/27/20 1206 01/27/20 1607 01/27/20 2057  GLUCAP 149* 185* 156* 162* 161*   Lipid Profile: No results for input(s): CHOL, HDL, LDLCALC, TRIG, CHOLHDL, LDLDIRECT in the last 72 hours. Thyroid Function Tests: No results for input(s): TSH, T4TOTAL, FREET4, T3FREE, THYROIDAB in the last 72 hours. Anemia Panel: No results for input(s): VITAMINB12, FOLATE, FERRITIN, TIBC, IRON, RETICCTPCT in the last 72 hours. Sepsis Labs: No results for input(s): PROCALCITON, LATICACIDVEN in the last 168 hours.  Recent Results (from the past 240 hour(s))  Body fluid culture     Status: None (Preliminary result)   Collection Time: 01/26/20  3:03 PM   Specimen: KNEE; Body Fluid  Result Value Ref Range Status   Specimen Description   Final    KNEE Performed at [facility], [address], [place], [place] [address]    Special Requests   Final    NONE Performed at [facility], [address] [place], [place] [address]    Gram Stain   Final    FEW WBC PRESENT, PREDOMINANTLY PMN NO ORGANISMS SEEN    Culture   Final    NO GROWTH < 24 HOURS Performed at [facility], [address], [place], [place] [address]    Report Status PENDING  Incomplete  Resp Panel by RT-PCR (Flu A&B, Covid) Nasopharyngeal Swab     Status: None   Collection Time: 01/26/20   4:01 PM   Specimen: Nasopharyngeal Swab; Nasopharyngeal(NP) swabs in vial transport medium  Result Value Ref Range Status   SARS Coronavirus 2 by RT PCR NEGATIVE NEGATIVE Final    Comment: (NOTE) SARS-CoV-2 target nucleic acids are NOT DETECTED.  The SARS-CoV-2 RNA is generally detectable in upper respiratory specimens during the acute phase of infection. The lowest concentration of SARS-CoV-2 viral copies this assay can detect is 138 copies/mL. A negative result does  not preclude SARS-Cov-2 infection and should not be used as the sole basis for treatment or other patient management decisions. A negative result may occur with  improper specimen collection/handling, submission of specimen other than nasopharyngeal swab, presence of viral mutation(s) within the areas targeted by this assay, and inadequate number of viral copies(<138 copies/mL). A negative result must be combined with clinical observations, patient history, and epidemiological information. The expected result is Negative.  Fact Sheet for Patients:  EntrepreneurPulse.com.au  Fact Sheet for Healthcare Providers:  IncredibleEmployment.be  This test is no t yet approved or cleared by the [place] FDA and  has been authorized for detection and/or diagnosis of SARS-CoV-2 by FDA under an Emergency Use Authorization (EUA). This EUA will remain  in effect (meaning this test can be used) for the duration of the COVID-19 declaration under Section 564(b)(1) of the Act, 21 U.S.C.section 360bbb-3(b)(1), unless the authorization is terminated  or revoked sooner.       Influenza A by PCR NEGATIVE NEGATIVE Final   Influenza B by PCR NEGATIVE NEGATIVE Final    Comment: (NOTE) The Xpert Xpress SARS-CoV-2/FLU/RSV plus assay is intended as an aid in the diagnosis of influenza from Nasopharyngeal swab specimens and should not be used as a sole basis for treatment. Nasal washings and aspirates  are unacceptable for Xpert Xpress SARS-CoV-2/FLU/RSV testing.  Fact Sheet for Patients: EntrepreneurPulse.com.au  Fact Sheet for Healthcare Providers: IncredibleEmployment.be  This test is not yet approved or cleared by the [place] FDA and has been authorized for detection and/or diagnosis of SARS-CoV-2 by FDA under an Emergency Use Authorization (EUA). This EUA will remain in effect (meaning this test can be used) for the duration of the COVID-19 declaration under Section 564(b)(1) of the Act, 21 U.S.C. section 360bbb-3(b)(1), unless the authorization is terminated or revoked.  Performed at [facility], [address] [place], [place] [address]          Radiology Studies: DG Chest 2 View  Result Date: 01/26/2020 CLINICAL DATA:  Shortness of breath EXAM: CHEST - 2 VIEW COMPARISON:  December 07, 2019 FINDINGS: The mediastinal contour and cardiac silhouette are stable. Cardiac pacemaker is unchanged. The heart size is enlarged. Diffuse increased pulmonary interstitium is identified bilaterally. There is no pleural effusion or focal pneumonia. The bony structures are stable. IMPRESSION: Congestive heart failure. Electronically Signed   By: Abelardo Diesel M.D.   On: 01/26/2020 15:51   DG Knee 2 Views Right  Result Date: 01/26/2020 CLINICAL DATA:  Bilateral leg weakness EXAM: RIGHT KNEE - 1-2 VIEW COMPARISON:  None. FINDINGS: Degenerative changes with joint space narrowing and spurring most pronounced in the patellofemoral compartment. Small joint effusion. No acute bony abnormality. Specifically, no fracture, subluxation, or dislocation. Dense vascular calcifications. IMPRESSION: Degenerative changes as above. Small joint effusion. No acute bony abnormality. Electronically Signed   By: Rolm Baptise M.D.   On: 01/26/2020 14:02        Scheduled Meds: . apixaban  2.5 mg Oral BID  . furosemide  60 mg Intravenous BID  . hydrALAZINE   25 mg Oral BID  . insulin aspart  0-6 Units Subcutaneous TID WC  . lenalidomide  5 mg Oral Daily  . methylPREDNISolone (SOLU-MEDROL) injection  60 mg Intravenous Daily  . tamsulosin  0.4 mg Oral QPC supper  . cyanocobalamin  1,000 mcg Oral Daily   Continuous Infusions:   LOS: 2 days    Time spent: 35 mins.More than 50% of that time was spent  in counseling and/or coordination of care.      Shelly Coss, MD [facility] P1/04/2020, 7:42 AM

## 2020-01-28 NOTE — Evaluation (Signed)
Physical Therapy Evaluation Patient Details Name: Evan [name] MRN: [id number] DOB: 31-Dec-1940 Today's Date: 01/28/2020   History of Present Illness  Evan [name] is a 80 y.o. male with medical history significant for chronic systolic heart failure with most recent LVEF 45 to 50% in November 2021, severe aortic stenosis status post TAVR in April 2019, permanent atrial fibrillation chronically anticoagulated on Eliquis, myelodysplastic syndrome complicated by chronic anemia, type 2 diabetes mellitus, hypertension, stage IIIb chronic kidney disease with baseline creatinine 1.6-2.0, who is admitted to [facility] on 01/26/2020 with acute on chronic systolic heart failure after presenting from home to [facility] Emergency Department complaining of swelling in the bilateral lower extremities.     Clinical Impression  Pt received slouched in Semi-Fowler's position and agreeable to therapy with wife present in the room.  Wife is currently asking for pt to be slide up in the bed in order for the pt to be able to breathe better.  Pt is currently on 2L of O2 with SpO2 > 90%.  Pt was able to perform bed-level exercises, requiring assistance from therapist for AAROM of the R LE due to pain/weakness of the R LE.  Pt then, with modA from therapist, able to transfer to seated EOB.  Pt sat unsupported, but has a posterior lean that was corrected by the therapist and pt was able to maintain following correction.  Pt attempted to perform STS with therapist x3 but was unsuccessful even with maxA from therapist.  Pt then transferred back to bed with [place] for R LE navigation.  Discussed d/c options with wife and recommendation of SNF for short term rehab, however wife was unreceptive to idea unless she would be able to stay with pt at facility.  Both pt and wife were educated on the safety portion of d/c home and navigating 6 steps with no ability to stand currently.  Pt will benefit from skilled PT  intervention to increase independence and safety with basic mobility in preparation for discharge to the venue listed below.        Follow Up Recommendations SNF    Equipment Recommendations       Recommendations for Other Services       Precautions / Restrictions Precautions Precautions: Fall Restrictions Weight Bearing Restrictions: No      Mobility  Bed Mobility Overal bed mobility: Needs Assistance Bed Mobility: Supine to Sit     Supine to sit: Mod assist     General bed mobility comments: PT required modA from therapist with use of HHA to come upright into sitting position.    Transfers Overall transfer level: Needs assistance   Transfers: Sit to/from Stand Sit to Stand: Max assist         General transfer comment: Therapist attempted to perform STS with pt giving maxA and pt was unable to perform.  Attempted x3 with no success.  Ambulation/Gait                Stairs            Wheelchair Mobility    Modified Rankin (Stroke Patients Only)       Balance Overall balance assessment: Needs assistance Sitting-balance support: No upper extremity supported;Feet supported Sitting balance-Leahy Scale: Poor Sitting balance - Comments: Pt has posterior lean with sitting EOB. Postural control: Posterior lean     Standing balance comment: not performed.  Pertinent Vitals/Pain Pain Assessment: No/denies pain    Home Living Family/patient expects to be discharged to:: Private residence Living Arrangements: Spouse/significant other Available Help at Discharge: Family;Available 24 hours/day Type of Home: House Home Access: Stairs to enter Entrance Stairs-Rails: Left Entrance Stairs-Number of Steps: 6 Home Layout: One level Home Equipment: Walker - 4 wheels;Grab bars - tub/shower      Prior Function Level of Independence: Needs assistance   Gait / Transfers Assistance Needed: Unable to walk longer  distances.           Hand Dominance   Dominant Hand: Right    Extremity/Trunk Assessment   Upper Extremity Assessment Upper Extremity Assessment: Generalized weakness    Lower Extremity Assessment Lower Extremity Assessment: Generalized weakness       Communication   Communication: HOH  Cognition Arousal/Alertness: Awake/alert Behavior During Therapy: WFL for tasks assessed/performed Overall Cognitive Status: Difficult to assess                                 General Comments: Pt is able to respond, but wife usually responds for pt.  Pt is also very HOH.      General Comments      Exercises Total Joint Exercises Ankle Circles/Pumps: AROM;Strengthening;Both;10 reps;Supine Quad Sets: AROM;Strengthening;Both;10 reps;Supine Gluteal Sets: AROM;Strengthening;Both;10 reps;Supine Hip ABduction/ADduction: AROM;Strengthening;Both;10 reps;Supine Straight Leg Raises: AROM;Strengthening;Both;10 reps;Supine Long Arc Quad: AROM;Strengthening;Both;10 reps;Seated   Assessment/Plan    PT Assessment Patient needs continued PT services  PT Problem List Decreased strength;Decreased range of motion;Decreased activity tolerance;Decreased balance;Decreased mobility;Decreased knowledge of use of DME;Decreased safety awareness;Pain       PT Treatment Interventions      PT Goals (Current goals can be found in the Care Plan section)  Acute Rehab PT Goals Patient Stated Goal: To get stronger. PT Goal Formulation: With patient/family Time For Goal Achievement: 02/11/20 Potential to Achieve Goals: Fair    Frequency Min 2X/week   Barriers to discharge Decreased caregiver support Inability to get into house due to stairs.    Co-evaluation               AM-PAC PT "6 Clicks" Mobility  Outcome Measure Help needed turning from your back to your side while in a flat bed without using bedrails?: A Lot Help needed moving from lying on your back to sitting on the  side of a flat bed without using bedrails?: A Lot Help needed moving to and from a bed to a chair (including a wheelchair)?: A Lot Help needed standing up from a chair using your arms (e.g., wheelchair or bedside chair)?: Total Help needed to walk in hospital room?: Total Help needed climbing 3-5 steps with a railing? : Total 6 Click Score: 9    End of Session Equipment Utilized During Treatment: Gait belt Activity Tolerance: Patient tolerated treatment well;Patient limited by pain Patient left: in bed;with call bell/phone within reach;with bed alarm set;with family/visitor present Nurse Communication: Mobility status PT Visit Diagnosis: Unsteadiness on feet (R26.81);Other abnormalities of gait and mobility (R26.89);Muscle weakness (generalized) (M62.81);Pain Pain - Right/Left: Right Pain - part of body: Leg    Time: 1005-1048 PT Time Calculation (min) (ACUTE ONLY): 43 min   Charges:   PT Evaluation $PT Eval Low Complexity: 1 Low PT Treatments $Therapeutic Exercise: 38-52 mins        Nolon Bussing, PT, DPT 01/28/20, 1:54 PM

## 2020-01-28 NOTE — Telephone Encounter (Signed)
Alyson - I spoke with Dr. Smith Robert. She will hold treatment due to multiple medical issues. Patient currently in hospital. Dr. B will decide when patient will go back on treatment after hospital discharge.

## 2020-01-28 NOTE — Evaluation (Signed)
Occupational Therapy Evaluation Patient Details Name: Evan [name] MRN: [id number] DOB: 10/08/1940 Today's Date: 01/28/2020    History of Present Illness Evan [name] is a 80 y.o. 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   Clinical Impression   Pt seen for OT evaluation this date in setting of acute hospitalization with swelling of B LEs. Pt with specific c/o pain in R LE which underwent arthrocentesis on 1/2 (30 cc of synovial fluid removed). Pt lives with spouse in [facility] with 6 STE. Pt states he is MOD I with 4WW for fxl mobility (occasional scooter use for longer distances) and INDEP with self care ADLs at baseline. However, pt's spouse reports that she has been helping him with bathing and dressing, at least lower body, some for 1-2 months now. She endorses noticing a decline in pt's INDEP since he started supplemental Oxygen in October of 2021. On assessment, pt noted to appear drowsy/fatigued, but is easily roused. In addition, pt with decreased fxl activity tolerance and grossly weak. Pt requires MOD A to come to sitting with HOB elevated and use of bed rails. Demos POOR static sitting balance, intermittently requiring MIN A to sustain.   Pt is currently requiring MAX to TOTAL A for seated or bed level LB ADLs including dressing and SETUP to MIN A with seated UB ADLs. Pt declines to participate with OT in  sit<>stand trial with AD (OT offers to get second person to help with stand, to no avail as pt is still not agreeable to standing). Pt requires encouragement throughout to engage and attend. Overall tolerates well, but is pain limited on R LE. Pt left in bed with all needs met and in reach. Spouse present and RN passing meds. Will continue to follow acutely, but anticipate pt will require extensive rehabilitation to safely return to home environment. Should family and pt choose to go home, will require below-listed AD/DME as well as ramp to enter/exit the home.    Follow Up Recommendations  SNF    Equipment Recommendations  3 in 1 bedside commode;Tub/shower seat;Hospital bed;Other (comment) (wheelchair)    Recommendations for Other Services       Precautions / Restrictions Precautions Precautions: Fall Restrictions Weight Bearing Restrictions: No      Mobility Bed Mobility Overal bed mobility: Needs Assistance Bed Mobility: Supine to Sit;Sit to Supine     Supine to sit: Mod assist Sit to supine: Max assist   General bed mobility comments: MAX A to manage LEs for back to bed.    Transfers Overall transfer level: Needs assistance   Transfers: Sit to/from Stand Sit to Stand: Max assist         General transfer comment: pt declines to attempt standing with OT even if help from a second person obtained.    Balance Overall balance assessment: Needs assistance Sitting-balance support: Feet supported;Bilateral upper extremity supported Sitting balance-Leahy Scale: Poor Sitting balance - Comments: requires UE support to sustain static sit, demos posterior lean. Requires cues to hold trunk up/attend  to maintaining sitting balance w/o help. ~50% of sitting bout, he requires MIN A to sustain Postural control: Posterior lean     Standing balance comment: not performed.                           ADL either performed or assessed with clinical judgement   ADL Overall  ADL's : Needs assistance/impaired                                       General ADL Comments: MAX to TOTAL A for seatd or bed level LB ADLs. SETUP to MIN A for seated UB ADLs. MOD/MAX A with HOB significantly elevated to come to sitting. Declines attempting to come to stand this date.     Vision Baseline Vision/History: Wears glasses Wears Glasses: Reading only Patient Visual Report: No change from baseline       Perception     Praxis      Pertinent Vitals/Pain Pain Assessment: Faces Faces Pain Scale: Hurts even more Pain Location: R knee with any mobilizations Pain Descriptors / Indicators: Tender;Tightness;Sore Pain Intervention(s): Limited activity within patient's tolerance;Monitored during session;Repositioned (eleavted LEs on  pillows in bed)     Hand Dominance Right   Extremity/Trunk Assessment Upper Extremity Assessment Upper Extremity Assessment: Generalized weakness   Lower Extremity Assessment Lower Extremity Assessment: Generalized weakness   Cervical / Trunk Assessment Cervical / Trunk Assessment: Kyphotic (Head/shlds FWD/down)   Communication Communication Communication: HOH   Cognition Arousal/Alertness: Awake/alert (not lethargic, but generally drowsy/fatigued throughout session.) Behavior During Therapy: WFL for tasks assessed/performed Overall Cognitive Status: Difficult to assess                                 General Comments: Pt with delayed reposnses, his wife is noted to respond for patient several times. Pt oriented to self, "hospital" and some, but not all aspects of situiaton. Not oriented to time. Follows ~90% of simple one step commands with increased time and visual/tactile cues d/t decreased hearing.   General Comments       Exercises Other Exercises: OT facilitates ed re: role of OT in acute setting, importance of OOB activity, seated abdominal/chest exercises to improve quality of breathing. Pt with  moderate reception, spouse present throughout and demos good reception of education Other Exercises: OT facilitates pt partication in seated postural extension for 1 set x5 reps with 2-3 second hold to improve surface area for quality of breathing. In addition, OT engages pt in 1 set x5 reps alternating (10 total) contralateral reaching to improve core control for balance as well as improve diaphragmatic strength for breathing and expelling phlegm.   Shoulder Instructions      Home Living Family/patient expects to be discharged to:: Private residence Living Arrangements: Spouse/significant other Available Help at Discharge: Family;Available 24 hours/day Type of Home: House Home Access: Stairs to enter [employer] of Steps: 6 Entrance Stairs-Rails: Left Home Layout: One level     Bathroom Shower/Tub: Tub/shower unit;Walk-in shower   Bathroom Toilet: Handicapped height Bathroom Accessibility: Yes   Home Equipment: Walker - 4 wheels;Grab bars - tub/shower;Electric scooter          Prior Functioning/Environment Level of Independence: Needs assistance  Gait / Transfers Assistance Needed: Uses 4WW primarily, but scooter for longer distances. ADL's /  Homemaking Assistance Needed: Spouse endorses helping pt with some LB bathing/dressing, especially in last month or so before admission.   Comments: Pt's spouse reports noticing a decline since pt was started on supplemental oxygen in October of 2021.        OT Problem List: Decreased strength;Decreased activity tolerance;Impaired balance (sitting and/or standing);Decreased cognition;Decreased safety awareness;Decreased knowledge of use of DME or AE;Cardiopulmonary status limiting activity;Obesity;Increased edema      OT Treatment/Interventions: Self-care/ADL training;DME and/or AE instruction;Therapeutic activities;Balance training;Therapeutic exercise;Energy conservation;Patient/family education    OT Goals(Current goals  can be found in the care plan section) Acute Rehab OT Goals Patient Stated Goal: to be able to go home after hospitalization OT Goal Formulation: With patient/family Time For Goal Achievement: 02/11/20 Potential to Achieve Goals: Fair ADL Goals Pt Will Perform Grooming: with set-up;with supervision;sitting (pt will tolerate 5-6 minute static sit with no external support while completing 2-3 g/h tasks to improve seated fxl activity tolerance.) Pt Will Perform Upper Body Dressing: with set-up;with supervision;sitting (to improve static sitting balance and fxl activity tolerance) Pt Will Transfer to Toilet: with mod assist;with max assist;with +2 assist;stand pivot transfer;bedside commode Pt/caregiver will Perform Home Exercise Program: Increased strength;Both right and left upper extremity;With minimal assist Additional ADL Goal #1: Pt will demo/verbalize use of 2 EC strategies with ADL tasks versus therapeutic activity with 0 cues.  OT Frequency: Min 1X/week   Barriers to D/C:            Co-evaluation              AM-PAC OT "6 Clicks" Daily Activity     Outcome Measure Help from another person eating meals?: A Little Help from another person taking care of personal grooming?: A Little Help from another person toileting, which includes using toliet, bedpan, or urinal?: Total Help from another person bathing (including washing, rinsing, drying)?: A Lot Help from another person to put on and taking off regular upper body clothing?: A Little Help from another person to put on and taking off regular lower body clothing?: Total 6 Click Score: 13   End of Session Equipment Utilized During Treatment: Oxygen Nurse Communication: Mobility status;Other (comment)  Activity Tolerance: Patient limited by fatigue Patient left: in bed;with call bell/phone within reach;with bed alarm set  OT Visit Diagnosis: Unsteadiness on feet (R26.81);Muscle weakness (generalized) (M62.81)                 Time: 5366-4403 OT Time Calculation (min): 46 min Charges:  OT General Charges $OT Visit: 1 Visit OT Evaluation $OT Eval Moderate Complexity: 1 Mod OT Treatments $Self Care/Home Management : 8-22 mins $Therapeutic Activity: 8-22 mins $Therapeutic Exercise: 8-22 mins  Gerrianne Scale, MS, OTR/L ascom [phone] 01/28/20, 4:25 PM

## 2020-01-29 ENCOUNTER — Ambulatory Visit: Payer: Medicare Other | Admitting: Physician Assistant

## 2020-01-29 DIAGNOSIS — I5023 Acute on chronic systolic (congestive) heart failure: Secondary | ICD-10-CM | POA: Diagnosis not present

## 2020-01-29 LAB — BASIC METABOLIC PANEL
Anion gap: 13 (ref 5–15)
BUN: 91 mg/dL — ABNORMAL HIGH (ref 8–23)
CO2: 30 mmol/L (ref 22–32)
Calcium: 8.9 mg/dL (ref 8.9–10.3)
Chloride: 94 mmol/L — ABNORMAL LOW (ref 98–111)
Creatinine, Ser: 2.18 mg/dL — ABNORMAL HIGH (ref 0.61–1.24)
GFR, Estimated: 30 mL/min — ABNORMAL LOW (ref >60)
Glucose, Bld: 202 mg/dL — ABNORMAL HIGH (ref 70–99)
Potassium: 4.1 mmol/L (ref 3.5–5.1)
Sodium: 137 mmol/L (ref 135–145)

## 2020-01-29 LAB — PTH, INTACT AND CALCIUM
Calcium, Total (PTH): 8.9 mg/dL (ref 8.6–10.2)
PTH: 72 pg/mL — ABNORMAL HIGH (ref 15–65)

## 2020-01-29 LAB — HEPATIC FUNCTION PANEL
ALT: 107 U/L — ABNORMAL HIGH (ref 0–44)
AST: 42 U/L — ABNORMAL HIGH (ref 15–41)
Albumin: 3.4 g/dL — ABNORMAL LOW (ref 3.5–5.0)
Alkaline Phosphatase: 94 U/L (ref 38–126)
Bilirubin, Direct: 1.9 mg/dL — ABNORMAL HIGH (ref 0.0–0.2)
Indirect Bilirubin: 2.2 mg/dL — ABNORMAL HIGH (ref 0.3–0.9)
Total Bilirubin: 4.1 mg/dL — ABNORMAL HIGH (ref 0.3–1.2)
Total Protein: 6.6 g/dL (ref 6.5–8.1)

## 2020-01-29 LAB — GLUCOSE 6 PHOSPHATE DEHYDROGENASE
G6PDH: 7.5 U/g{Hb} (ref 4.8–15.7)
Hemoglobin: 13 g/dL (ref 13.0–17.7)

## 2020-01-29 LAB — CBC WITH DIFFERENTIAL/PLATELET
Abs Immature Granulocytes: 0.84 10*3/uL — ABNORMAL HIGH (ref 0.00–0.07)
Basophils Absolute: 0 10*3/uL (ref 0.0–0.1)
Basophils Relative: 1 %
Eosinophils Absolute: 0.6 10*3/uL — ABNORMAL HIGH (ref 0.0–0.5)
Eosinophils Relative: 10 %
HCT: 22.6 % — ABNORMAL LOW (ref 39.0–52.0)
Hemoglobin: 7.2 g/dL — ABNORMAL LOW (ref 13.0–17.0)
Immature Granulocytes: 15 %
Lymphocytes Relative: 6 %
Lymphs Abs: 0.3 10*3/uL — ABNORMAL LOW (ref 0.7–4.0)
MCH: 32.4 pg (ref 26.0–34.0)
MCHC: 31.9 g/dL (ref 30.0–36.0)
MCV: 101.8 fL — ABNORMAL HIGH (ref 80.0–100.0)
Monocytes Absolute: 0.4 10*3/uL (ref 0.1–1.0)
Monocytes Relative: 6 %
Neutro Abs: 3.6 10*3/uL (ref 1.7–7.7)
Neutrophils Relative %: 62 %
Platelets: 43 10*3/uL — ABNORMAL LOW (ref 150–400)
RBC: 2.22 MIL/uL — ABNORMAL LOW (ref 4.22–5.81)
RDW: 22.9 % — ABNORMAL HIGH (ref 11.5–15.5)
Smear Review: DECREASED
WBC: 5.7 10*3/uL (ref 4.0–10.5)
nRBC: 1.9 % — ABNORMAL HIGH (ref 0.0–0.2)

## 2020-01-29 LAB — GLUCOSE, CAPILLARY
Glucose-Capillary: 198 mg/dL — ABNORMAL HIGH (ref 70–99)
Glucose-Capillary: 190 mg/dL — ABNORMAL HIGH (ref 70–99)
Glucose-Capillary: 217 mg/dL — ABNORMAL HIGH (ref 70–99)
Glucose-Capillary: 243 mg/dL — ABNORMAL HIGH (ref 70–99)

## 2020-01-29 LAB — CK: Total CK: 93 U/L (ref 49–397)

## 2020-01-29 LAB — PHOSPHORUS: Phosphorus: 5.7 mg/dL — ABNORMAL HIGH (ref 2.5–4.6)

## 2020-01-29 LAB — URIC ACID: Uric Acid, Serum: 17.7 mg/dL — ABNORMAL HIGH (ref 3.7–8.6)

## 2020-01-29 LAB — HAPTOGLOBIN: Haptoglobin: 190 mg/dL (ref 34–355)

## 2020-01-29 MED ORDER — POLYETHYLENE GLYCOL 3350 17 G PO PACK
17.0000 g | PACK | Freq: Every day | ORAL | Status: DC
  Administered 2020-01-29 – 2020-02-04 (×7): 17 g via ORAL
  Filled 2020-01-29 (×7): qty 1

## 2020-01-29 MED ORDER — FUROSEMIDE 40 MG PO TABS: 80.0000 mg | ORAL_TABLET | Freq: Every day | ORAL | Status: DC

## 2020-01-29 MED ORDER — FUROSEMIDE 40 MG PO TABS
40.0000 mg | ORAL_TABLET | Freq: Every day | ORAL | Status: DC
  Administered 2020-01-29: 40 mg via ORAL
  Filled 2020-01-29: qty 1

## 2020-01-29 MED ORDER — FUROSEMIDE 40 MG PO TABS
80.0000 mg | ORAL_TABLET | Freq: Every day | ORAL | Status: DC
  Administered 2020-01-30: 80 mg via ORAL
  Filled 2020-01-29 (×2): qty 2

## 2020-01-29 MED ORDER — DOCUSATE SODIUM 100 MG PO CAPS
100.0000 mg | ORAL_CAPSULE | Freq: Every day | ORAL | Status: DC
  Administered 2020-01-29 – 2020-02-04 (×7): 100 mg via ORAL
  Filled 2020-01-29 (×8): qty 1

## 2020-01-29 NOTE — Progress Notes (Signed)
PROGRESS NOTE    Evan [name]  [id number] DOB: 1940/03/30 DOA: 01/26/2020 PCP: Leonel Ramsay, MD   Brief Narrative:  Patient is a 80 year old male with history of chronic systolic heart failure, severe aortic stenosis status post TAVR, permanent A. fib, myelodysplastic syndrome, chronic anemia, type 2 diabetes, hypertension, stage IIIb CKD with a baseline creatinine of 1.6-2.0 who presented with complaints of bilateral lower extremity swelling.He was also complaining of dyspnea, orthopnea.  He also was noticed to have right knee effusion.  Patient was admitted for the management of acute on chronic congestive heart failure and acute gout.  He underwent arthrocentesis of the knee which showed urate crystals.PT recommending SNF on  discharge  Assessment & Plan:  Acute on chronic systolic congestive heart failure: -Presented with bilateral leg swelling and elevated BNP on admission. -Echocardiogram done  on 12/08/2019 and showing global hypokinesis, mild concentric LVH, LVEF 45 to 50%, and indeterminate diastolic function.He also has a history of severe aortic stenosis status post TAVR in April 2019 as well as permanent atrial fibrillation complicated by sick sinus syndrome status post pacemaker placement in February 2019 andchronically anticoagulated on Eliquis.Follows with Dr. Eliseo Gum his outpatient cardiologist.  -Started on Lasix 60 mg IV twice daily-changed to home dose today-80 mg in the morning and 40 mg in the evening - continue to monitor input and output, daily weight Patient is on 2 L of oxygen at home.  Currently on baseline oxygen requirement  AKI on CKD stage IIIb: Most likely secondary to cardiorenal syndrome.  His baseline creatinine ranges from 1.6-2.  Continue diuresis.  Monitor BMP.Improving and kidney function coming close to baseline.   Right knee effusion: Presented with 4 to 5 days history of progressive right knee pain associated with swelling, erythema  suggesting inflammatory arthropathy.  Underwent arthrocentesis in the emergency department.  Synovial fluid significant neutrophils suggesting inflammatory arthritis, presence of urate crystals.  He has severely elevated uric acid level.  He has acute gout flare, will not start allopurinol for now,shd consider allopurinol after resolution of acute flare.  Colchicine is contraindicated on the background of CKD.  We will continue slow taper of the steroids of prednisone . Given 60 mg of IV Solu-Medrol on 01/27/19. He needs to follow-up with rheumatology as an outpatient.  Chronic macrocytic anemia/thrombocytopenia: History of myelodysplastic syndrome.  Follows with Dr. Rogue Bussing.  He was a started on Lenalidomidewas started on 01/09/2020 as management for his myelodysplastic syndrome. His MDS appears to be complicated with chronic macrocytic anemia, with baseline hemoglobin of 7-8.He  underwent transfusion of 1 unit PRBC on 01/20/2020. Hemoglobin dropped to 6.6 on 01/27/2020 and he was transfused with unit of PRBC.Hb in the range of 7 -Continue to monitor H&H closely and transfuse as needed.  Suspicion for hemolytic anemia/tumor lysis syndrome: Elevated uric acid level, bilirubin, phosphorus.   -Elevated total bilirubin of 5.2, direct bilirubin: 2.4 and indirect bilirubin: 2.8. -Oncology Dr. Janese Banks was consulted who recommended to consult nephrology. -Consulted nephrology-we will check for G6PD.  Type diabetes mellitus: Takes glimepiride at home.  Continue sliding-scale insulin here.  Monitor blood sugars  Permanent A. [id number] history of suchandcomplicated by sick sinus syndrome status post pacemaker placement in February 2019.  On Eliquis for anticoagulation.We will reduce the dose of eliquis to 2.5 BID . currently rate is controlled.  If platelets drops less than 30,000, will stop Eliquis.  BPH:On  Flomax  Hyperphosphatemia: Most likely history with CKD.  -Phosphorus trended down from  6.1-5.7 today.  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  PTH: Elevated at 72, calcium: 8.9.   -Continue PhosLo.  Consulted nephrology.  Elevated liver enzymes/elevated bilirubin: Negative hepatitis panel.  He has chronically elevated bilirubin as per previous records.  As per ultrasound of the right upper quadrant on 11/21 he has cirrhosis of the liver.  Continue monitoring.  Hypertension: Blood pressure is stable.  Continue hydralazine 25 mg twice daily.  Continue to monitor blood pressure closely.  Sleepiness/drowsiness: Noted on 01/27/2020.  Most likely secondary to opioids.  Fentanyl discontinued.  Given a dose of Narcan.  Minimize narcotics.   Debility/deconditioning: PT/OT recommended SNF placement.  TOC consulted.  DVT prophylaxis:  Eliquis/SCD  Code Status: Full code Family Communication: Patient's wife present at bedside.  Plan of care discussed with patient in length and he verbalized understanding and agreed with it. Disposition Plan: SNF  Consultants:   Nephrology  Oncology  Procedures:   Echo  Antimicrobials:   None  Status is: Inpatient  Dispo: The patient is from: Home              Anticipated d/c is to: SNF              Anticipated d/c date is: 1 day              Patient currently is not medically stable to d/c.      Subjective: Patient seen and examined.  Wife at bedside.  Patient tells me that his breathing has improved and is leg swelling has resolved.  He continues to have right knee pain and swelling for which he took Tylenol.  Remained afebrile.  No acute events overnight.  Objective: Vitals:   01/29/20 0454 01/29/20 0700 01/29/20 0747 01/29/20 0924  BP: 137/63  130/67 (!) 146/67  Pulse: 65  63   Resp: 20  18   Temp: (!) 97.3 F (36.3 C)  97.6 F (36.4 C)   TempSrc: Oral     SpO2: 96%  93%   Weight:  113.9 kg    Height:        Intake/Output Summary (Last 24 hours) at 01/29/2020 1046 Last data filed at 01/29/2020  0945 Gross per 24 hour  Intake 600 ml  Output 3100 ml  Net -2500 ml   Filed Weights   01/26/20 1245 01/28/20 0539 01/29/20 0700  Weight: 112.4 kg 115 kg 113.9 kg    Examination:  General exam: Appears calm and comfortable, elderly, on 2 L of oxygen via nasal cannula Respiratory system: Clear to auscultation. Respiratory effort normal.  No wheezing, crackles or rhonchi. Cardiovascular system: S1 & S2 heard, RRR. No JVD, murmurs, rubs, gallops or clicks.  Bilateral 1+ pitting edema positive.. Gastrointestinal system: Abdomen is nondistended, soft and nontender. No organomegaly or masses felt. Normal bowel sounds heard. Central nervous system: Alert and oriented. No focal neurological deficits. Extremities: Symmetric 5 x 5 power. Skin: No rashes, lesions or ulcers Psychiatry: Judgement and insight appear normal. Mood & affect appropriate.    Data Reviewed: I have personally reviewed following labs and imaging studies  CBC: Recent Labs  Lab 01/26/20 1300 01/27/20 0718 01/28/20 0525 01/29/20 0533  WBC 7.6 7.8 7.3 5.7  NEUTROABS  --   --  5.3 3.6  HGB 7.1* 6.6* 7.6* 7.2*  HCT 22.9* 21.1* 23.3* 22.6*  MCV 104.1* 106.0* 100.4* 101.8*  PLT 63* 50* 45* 43*   Basic Metabolic Panel: Recent Labs  Lab 01/26/20 1300 01/26/20 1400 01/27/20 0718 01/28/20 0525 01/28/20  0810 01/29/20 0533  NA 134*  --  135 137 136 137  K 5.1  --  5.1 4.3 4.4 4.1  CL 94*  --  96* 95* 94* 94*  CO2 26  --  23 27 27 30   GLUCOSE 150*  --  196* 167* 194* 202*  BUN 45*  --  64* 83* 82* 91*  CREATININE 2.35*  --  2.60* 2.41* 2.38* 2.18*  CALCIUM 9.0  --  9.0 9.0 8.9  8.9 8.9  MG 2.3  --  2.5*  --   --   --   PHOS  --  4.7* 5.6* 6.1*  --  5.7*   GFR: Estimated Creatinine Clearance: 36.4 mL/min (A) (by C-G formula based on SCr of 2.18 mg/dL (H)). Liver Function Tests: Recent Labs  Lab 01/26/20 1300 01/27/20 0718 01/28/20 0525 01/28/20 0810 01/28/20 1414 01/29/20 0533  AST 26 133* 68* 59*  --   42*  ALT 24 112* 128* 122*  --  107*  ALKPHOS 132* 112 102 98  --  94  BILITOT 5.7* 5.5* 5.1* 5.0* 5.2* 4.1*  PROT 7.1 6.9 6.8 6.7  --  6.6  ALBUMIN 3.9 3.6 3.7 3.6  --  3.4*   No results for input(s): LIPASE, AMYLASE in the last 168 hours. No results for input(s): AMMONIA in the last 168 hours. Coagulation Profile: No results for input(s): INR, PROTIME in the last 168 hours. Cardiac Enzymes: No results for input(s): CKTOTAL, CKMB, CKMBINDEX, TROPONINI in the last 168 hours. BNP (last 3 results) Recent Labs    12/06/19 1633  PROBNP 9,423*   HbA1C: No results for input(s): HGBA1C in the last 72 hours. CBG: Recent Labs  Lab 01/28/20 0815 01/28/20 1203 01/28/20 1608 01/28/20 1932 01/29/20 0832  GLUCAP 158* 168* 212* 220* 198*   Lipid Profile: No results for input(s): CHOL, HDL, LDLCALC, TRIG, CHOLHDL, LDLDIRECT in the last 72 hours. Thyroid Function Tests: No results for input(s): TSH, T4TOTAL, FREET4, T3FREE, THYROIDAB in the last 72 hours. Anemia Panel: Recent Labs    01/28/20 1455  VITAMINB12 5,466*  FOLATE 8.4  RETICCTPCT 3.2*   Sepsis Labs: No results for input(s): PROCALCITON, LATICACIDVEN in the last 168 hours.  Recent Results (from the past 240 hour(s))  Body fluid culture     Status: None (Preliminary result)   Collection Time: 01/26/20  3:03 PM   Specimen: KNEE; Body Fluid  Result Value Ref Range Status   Specimen Description   Final    KNEE Performed at [facility], [address], [place], [place]    Special Requests   Final    NONE Performed at [facility], [address] [place], [place]    Gram Stain   Final    FEW WBC PRESENT, PREDOMINANTLY PMN NO ORGANISMS SEEN    Culture   Final    NO GROWTH 3 DAYS Performed at [facility], [address] [place], [place]    Report Status PENDING  Incomplete  Resp Panel by RT-PCR (Flu A&B, Covid) Nasopharyngeal Swab     Status: None    Collection Time: 01/26/20  4:01 PM   Specimen: Nasopharyngeal Swab; Nasopharyngeal(NP) swabs in vial transport medium  Result Value Ref Range Status   SARS Coronavirus 2 by RT PCR NEGATIVE NEGATIVE Final    Comment: (NOTE) SARS-CoV-2 target nucleic acids are NOT DETECTED.  The SARS-CoV-2 RNA is generally detectable in upper respiratory specimens during the acute phase  of infection. The lowest concentration of SARS-CoV-2 viral copies this assay can detect is 138 copies/mL. A negative result does not preclude SARS-Cov-2 infection and should not be used as the sole basis for treatment or other patient management decisions. A negative result may occur with  improper specimen collection/handling, submission of specimen other than nasopharyngeal swab, presence of viral mutation(s) within the areas targeted by this assay, and inadequate number of viral copies(<138 copies/mL). A negative result must be combined with clinical observations, patient history, and epidemiological information. The expected result is Negative.  Fact Sheet for Patients:  EntrepreneurPulse.com.au  Fact Sheet for Healthcare Providers:  IncredibleEmployment.be  This test is no t yet approved or cleared by the [place] FDA and  has been authorized for detection and/or diagnosis of SARS-CoV-2 by FDA under an Emergency Use Authorization (EUA). This EUA will remain  in effect (meaning this test can be used) for the duration of the COVID-19 declaration under Section 564(b)(1) of the Act, 21 U.S.C.section 360bbb-3(b)(1), unless the authorization is terminated  or revoked sooner.       Influenza A by PCR NEGATIVE NEGATIVE Final   Influenza B by PCR NEGATIVE NEGATIVE Final    Comment: (NOTE) The Xpert Xpress SARS-CoV-2/FLU/RSV plus assay is intended as an aid in the diagnosis of influenza from Nasopharyngeal swab specimens and should not be used as a sole basis for treatment.  Nasal washings and aspirates are unacceptable for Xpert Xpress SARS-CoV-2/FLU/RSV testing.  Fact Sheet for Patients: EntrepreneurPulse.com.au  Fact Sheet for Healthcare Providers: IncredibleEmployment.be  This test is not yet approved or cleared by the [place] FDA and has been authorized for detection and/or diagnosis of SARS-CoV-2 by FDA under an Emergency Use Authorization (EUA). This EUA will remain in effect (meaning this test can be used) for the duration of the COVID-19 declaration under Section 564(b)(1) of the Act, 21 U.S.C. section 360bbb-3(b)(1), unless the authorization is terminated or revoked.  Performed at [facility], [address] [place], [place] [address]       Radiology Studies: No results found.  Scheduled Meds: . apixaban  2.5 mg Oral BID  . calcium acetate  667 mg Oral TID WC  . furosemide  60 mg Intravenous BID  . hydrALAZINE  25 mg Oral BID  . insulin aspart  0-6 Units Subcutaneous TID WC  . lidocaine  1 patch Transdermal Q24H  . predniSONE  50 mg Oral Q breakfast  . tamsulosin  0.4 mg Oral QPC supper  . cyanocobalamin  1,000 mcg Oral Daily   Continuous Infusions:   LOS: 3 days   Time spent: 40 minutes.   Mckinley Jewel, MD [facility]  If 7PM-7AM, please contact night-coverage [email or web address] 01/29/2020, 10:46 AM

## 2020-01-29 NOTE — Consult Note (Signed)
[facility]  CONSULT NOTE    Date: 01/29/2020                  Patient Name:  Evan [name]  MRN: [id number]  DOB: 12/26/1940  Age / Sex: 80 y.o., male         PCP: Leonel Ramsay, MD                 Service Requesting Consult: Dr. Doristine Bosworth                 Reason for Consult: Acute kidney injury            History of Present Illness: Evan [name]  Admitted to [facility] for increasing bilateral lower extremity edema. Patient found to have right knee effusion that had uric acid crystals. Patient started on IV furosemide. Found to have acute kidney injury and hyperuricemia. No uric acid levels prior. Normal CK level. Patient denies any pain.  Wife is at bedside who assists with history taking. Patient has hearing loss and must be spoke to loudly. Currently on more oxygen than his home requirements.  Patient states he is breathing better and his peripheral edema has improved.    Medications: Outpatient medications: Medications Prior to Admission  Medication Sig Dispense Refill Last Dose  . acetaminophen (TYLENOL) 650 MG CR tablet Take 1,300 mg by mouth every 8 (eight) hours as needed for pain.    01/26/2020 at 0900  . amLODipine (NORVASC) 2.5 MG tablet Take 1 tablet (2.5 mg total) by mouth daily. 90 tablet 3 01/26/2020 at 0900  . apixaban (ELIQUIS) 5 MG TABS tablet Take 1 tablet (5 mg total) by mouth 2 (two) times daily. 60 tablet 5 01/26/2020 at 0900  . cyanocobalamin 1000 MCG tablet Take 1,000 mcg by mouth daily.    01/26/2020 at 0900  . ezetimibe (ZETIA) 10 MG tablet TAKE 1 TABLET BY MOUTH EVERY DAY (Patient taking differently: Take 10 mg by mouth daily.) 90 tablet 2 01/26/2020 at 0900  . furosemide (LASIX) 80 MG tablet Take 80 mg daily in the mornings and 40 mg (half tab) in the evenings 135 tablet 3 01/26/2020 at 0900  . glimepiride (AMARYL) 2 MG tablet TAKE 1 TABLET (2 MG TOTAL) BY MOUTH DAILY WITH BREAKFAST (Patient taking differently: Take 2 mg by mouth daily  with breakfast.) 90 tablet 0 01/26/2020 at 0900  . hydrALAZINE (APRESOLINE) 25 MG tablet Take 1 tablet (25 mg total) by mouth 2 (two) times daily. 180 tablet 3 01/26/2020 at 0900  . lenalidomide (REVLIMID) 5 MG capsule Take 1 capsule (5 mg total) by mouth daily. Fanny Dance # [id number]     Date Obtained 01/01/2020 (Patient taking differently: Take 5 mg by mouth daily. [employer] Auth # [id number]     Date Obtained 01/01/2020) 28 capsule 0 01/25/2020 at 1400  . loratadine (CLARITIN) 10 MG tablet Take 10 mg by mouth daily.   01/26/2020 at 0900  . pantoprazole (PROTONIX) 40 MG tablet Take 1 tablet (40 mg total) by mouth daily. 30 tablet 3 01/26/2020 at 0900  . potassium chloride SA (KLOR-CON) 20 MEQ tablet Take 1 tablet (20 mEq total) by mouth 2 (two) times daily. 90 tablet 3 01/26/2020 at 0900  . tamsulosin (FLOMAX) 0.4 MG CAPS capsule TAKE 1 CAPSULE BY MOUTH ONCE DAILY (Patient taking differently: Take 0.4 mg by mouth daily.) 90 capsule 3 01/26/2020 at 0900  . benazepril (LOTENSIN) 40 MG tablet Take 40 mg by  mouth daily. (Patient not taking: Reported on 01/26/2020)   Not Taking at Unknown time  . omeprazole (PRILOSEC) 40 MG capsule Take 40 mg by mouth daily. (Patient not taking: Reported on 01/26/2020)   Not Taking at Unknown time  . ondansetron (ZOFRAN) 8 MG tablet One pill every 8 hours as needed for nausea/vomitting. (Patient taking differently: Take 8 mg by mouth every 8 (eight) hours as needed for nausea or vomiting. One pill every 8 hours as needed for nausea/vomitting.) 40 tablet 1 unknown at prn  . Polyvinyl Alcohol-Povidone (REFRESH OP) Place 1 drop into both eyes daily as needed (for dry eyes).    unknown at prn  . prochlorperazine (COMPAZINE) 10 MG tablet Take 1 tablet (10 mg total) by mouth every 6 (six) hours as needed for nausea or vomiting. 40 tablet 1 unknown at prn  . traZODone (DESYREL) 50 MG tablet Take 50 mg by mouth at bedtime. (Patient not taking: Reported on 01/26/2020)   Not Taking at Unknown time     Current medications: Current Facility-Administered Medications  Medication Dose Route Frequency Provider Last Rate Last Admin  . acetaminophen (TYLENOL) tablet 650 mg  650 mg Oral Q6H PRN Howerter, Justin B, DO   650 mg at 01/29/20 2694   Or  . acetaminophen (TYLENOL) suppository 650 mg  650 mg Rectal Q6H PRN Howerter, Justin B, DO      . apixaban (ELIQUIS) tablet 2.5 mg  2.5 mg Oral BID Burnadette Pop, MD   2.5 mg at 01/29/20 0918  . calcium acetate (PHOSLO) capsule 667 mg  667 mg Oral TID WC Burnadette Pop, MD   667 mg at 01/29/20 1327  . docusate sodium (COLACE) capsule 100 mg  100 mg Oral Daily Pahwani, Rinka R, MD   100 mg at 01/29/20 1339  . furosemide (LASIX) tablet 80 mg  80 mg Oral Daily Pahwani, Rinka R, MD       And  . furosemide (LASIX) tablet 40 mg  40 mg Oral q1800 Pahwani, Rinka R, MD      . hydrALAZINE (APRESOLINE) tablet 25 mg  25 mg Oral BID Howerter, Justin B, DO   25 mg at 01/29/20 0951  . HYDROcodone-acetaminophen (NORCO) 7.5-325 MG per tablet 1 tablet  1 tablet Oral Q6H PRN Burnadette Pop, MD   1 tablet at 01/29/20 0424  . insulin aspart (novoLOG) injection 0-6 Units  0-6 Units Subcutaneous TID WC Howerter, Justin B, DO   1 Units at 01/29/20 1326  . lidocaine (LIDODERM) 5 % 1 patch  1 patch Transdermal Q24H Burnadette Pop, MD   1 patch at 01/29/20 1339  . naloxone [facility]) injection 0.4 mg  0.4 mg Intravenous PRN Burnadette Pop, MD   0.4 mg at 01/27/20 1903  . polyethylene glycol (MIRALAX / GLYCOLAX) packet 17 g  17 g Oral Daily Pahwani, Rinka R, MD   17 g at 01/29/20 1339  . predniSONE (DELTASONE) tablet 50 mg  50 mg Oral Q breakfast Burnadette Pop, MD   50 mg at 01/29/20 0917  . prochlorperazine (COMPAZINE) tablet 10 mg  10 mg Oral Q6H PRN Howerter, Justin B, DO      . tamsulosin (FLOMAX) capsule 0.4 mg  0.4 mg Oral QPC supper Howerter, Justin B, DO   0.4 mg at 01/28/20 1657  . vitamin B-12 (CYANOCOBALAMIN) tablet 1,000 mcg  1,000 mcg Oral Daily Howerter,  Justin B, DO   1,000 mcg at 01/29/20 8546      Allergies: Allergies  Allergen Reactions  .  Niacin Other (See Comments) and Rash    Red face and burns.  . Niacin And Related Rash and Other (See Comments)    Red face and burns.  . Metformin And Related Diarrhea  . Statins Other (See Comments)    Myalgias, weakness      Past Medical History: Past Medical History:  Diagnosis Date  . Anemia   . Carotid arterial disease ([place])   . CHF (congestive heart failure) ([place])   . Coronary artery disease   . Hearing loss   . HOH (hard of hearing)   . Hyperlipidemia   . Hypertension   . Nocturia   . Obesity   . Osteoarthritis    "BUE; BLE; right knee" (03/23/2017)  . Peripheral vision loss, bilateral    S/P 07/2015  . Permanent atrial fibrillation ([place])   . 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 Severe aortic stenosis   . Sinus drainage   . Stroke [facility]) 07/2015   "peripheral vision still bad out of left eye" (03/23/2017)  . Torn rotator cuff 2012   Right arm  . Type II diabetes mellitus ([place])   . Urinary frequency      Past Surgical History: Past Surgical History:  Procedure Laterality Date  . BACK SURGERY    . CARDIAC CATHETERIZATION  11/29/2005   SEVERE THREE VESSEL OBSTRUCTIVE ATHERSCLEROTIC CAD. ALL GRAFTS WERE PATENT. EF 65%  . CARDIAC CATHETERIZATION  03/23/2017  . CAROTID ENDARTERECTOMY Right 01-29-08   cea  . CARPAL TUNNEL RELEASE Right   . COLONOSCOPY N/A 12/10/2019   Procedure: COLONOSCOPY;  Surgeon: Wilford Corner, MD;  Location: [facility] ENDOSCOPY;  Service: Endoscopy;  Laterality: N/A;  . CORONARY ARTERY BYPASS GRAFT  03/2003   X3. LIMA GRAFT TO THE LAD, SAPHENOUS VEIN GRAFT TO THE PA, AND A LEFT RADIAL GRAFT TO THEOBTUSE MARGINAL VESSEL  . ENDARTERECTOMY Left 05/28/2012   Procedure: ENDARTERECTOMY CAROTID;  Surgeon: Elam Dutch, MD;  Location: [place];  Service: Vascular;  Laterality: Left;  . ESOPHAGOGASTRODUODENOSCOPY N/A 12/10/2019   Procedure:  ESOPHAGOGASTRODUODENOSCOPY (EGD);  Surgeon: Wilford Corner, MD;  Location: [place];  Service: Endoscopy;  Laterality: N/A;  . HEMOSTASIS CLIP PLACEMENT  12/10/2019   Procedure: HEMOSTASIS CLIP PLACEMENT;  Surgeon: Wilford Corner, MD;  Location: [place] ENDOSCOPY;  Service: Endoscopy;;  . INSERT / REPLACE / REMOVE PACEMAKER  03/23/2017  . [place]; 1984  . MOLE SURGERY     "? face/arms"  . PACEMAKER IMPLANT N/A 03/23/2017   Procedure: PACEMAKER IMPLANT;  Surgeon: Constance Haw, MD;  Location: [place] CV LAB;  Service: Cardiovascular;  Laterality: N/A;  . PATCH ANGIOPLASTY Left 05/28/2012   Procedure: WITH dACRON PATCH ANGIOPLASTY;  Surgeon: Elam Dutch, MD;  Location: [place];  Service: Vascular;  Laterality: Left;  . POLYPECTOMY  12/10/2019   Procedure: POLYPECTOMY;  Surgeon: Wilford Corner, MD;  Location[facility] ENDOSCOPY;  Service: Endoscopy;;  . RIGHT/LEFT HEART CATH AND CORONARY/GRAFT ANGIOGRAPHY N/A 03/23/2017   Procedure: RIGHT/LEFT HEART CATH AND CORONARY/GRAFT ANGIOGRAPHY;  Surgeon: Sherren Mocha, MD;  Location: [place] CV LAB;  Service: Cardiovascular;  Laterality: N/A;  . TEE WITHOUT CARDIOVERSION N/A 05/09/2017   Procedure: TRANSESOPHAGEAL ECHOCARDIOGRAM (TEE);  Surgeon: Sherren Mocha, MD;  Location: [place];  Service: Open Heart Surgery;  Laterality: N/A;  . TRANSCATHETER AORTIC VALVE REPLACEMENT, TRANSFEMORAL N/A 05/09/2017   Procedure: TRANSCATHETER AORTIC VALVE REPLACEMENT, TRANSFEMORAL using an Edwards 34mm Sapien 3 Aortic Valve;  Surgeon: Sherren Mocha, MD;  Location:  [place] OR;  Service: Open Heart Surgery;  Laterality: N/A;     Family History: Family History  Problem Relation Age of Onset  . Heart attack Father   . Heart attack Brother   . Parkinsonism Brother   . Cancer Brother   . Arrhythmia Brother      Social History: Social History   Socioeconomic History  . Marital status: Married    Spouse name: Not on file  . Number  of children: 5  . Years of education: Not on file  . Highest education level: Not on file  Occupational History  . Occupation: retired    Fish farm manager: RETIRED  Tobacco Use  . Smoking status: Former Smoker    Packs/day: 2.00    Years: 39.00    Pack years: 78.00    Types: Cigarettes    Quit date: 01/24/1994    Years since quitting: 26.0  . Smokeless tobacco: Never Used  Vaping Use  . Vaping Use: Never used  Substance and Sexual Activity  . Alcohol use: No  . Drug use: No  . Sexual activity: Never  Other Topics Concern  . Not on file  Social History Narrative   On Farm/ in [place]; no alcohol; quit smoking- 96; worked in Estate agent.    Social Determinants of Health   Financial Resource Strain: Not on file  Food Insecurity: Not on file  Transportation Needs: Not on file  Physical Activity: Not on file  Stress: Not on file  Social Connections: Not on file  Intimate Partner Violence: Not on file     Review of Systems: Review of Systems  Constitutional: Negative.   HENT: Negative.   Eyes: Negative.   Respiratory: Positive for shortness of breath and wheezing. Negative for cough, hemoptysis and sputum production.   Cardiovascular: Positive for orthopnea, leg swelling and PND. Negative for chest pain, palpitations and claudication.  Gastrointestinal: Negative.   Genitourinary: Negative for dysuria, flank pain, frequency, hematuria and urgency.  Musculoskeletal: Negative for back pain, falls, joint pain, myalgias and neck pain.  Skin: Negative.   Neurological: Negative.   Endo/Heme/Allergies: Negative.   Psychiatric/Behavioral: Negative.     Vital Signs: Blood pressure 134/60, pulse 68, temperature 97.7 F (36.5 C), temperature source Oral, resp. rate 18, height 6' (1.829 m), weight 113.9 kg, SpO2 95 %.  Weight trends: Filed Weights   01/26/20 1245 01/28/20 0539 01/29/20 0700  Weight: 112.4 kg 115 kg 113.9 kg    Physical Exam: General: NAD,   Head:  Normocephalic, atraumatic. Moist oral mucosal membranes, hard of hearing   Eyes: Anicteric, PERRL  Neck: Supple, trachea midline  Lungs:  Clear to auscultation  Heart: Regular rate and rhythm  Abdomen:  Soft, nontender,   Extremities:  + peripheral edema. Right knee effusion - nontender  Neurologic: Nonfocal, moving all four extremities  Skin: No lesions         Lab results: Basic Metabolic Panel: Recent Labs  Lab 01/26/20 1300 01/26/20 1400 01/27/20 0718 01/28/20 0525 01/28/20 0810 01/29/20 0533  NA 134*  --  135 137 136 137  K 5.1  --  5.1 4.3 4.4 4.1  CL 94*  --  96* 95* 94* 94*  CO2 26  --  23 27 27 30   GLUCOSE 150*  --  196* 167* 194* 202*  BUN 45*  --  64* 83* 82* 91*  CREATININE 2.35*  --  2.60* 2.41* 2.38* 2.18*  CALCIUM 9.0  --  9.0 9.0 8.9  8.9  8.9  MG 2.3  --  2.5*  --   --   --   PHOS  --    < > 5.6* 6.1*  --  5.7*   < > = values in this interval not displayed.    Liver Function Tests: Recent Labs  Lab 01/28/20 0525 01/28/20 0810 01/28/20 1414 01/29/20 0533  AST 68* 59*  --  42*  ALT 128* 122*  --  107*  ALKPHOS 102 98  --  94  BILITOT 5.1* 5.0* 5.2* 4.1*  PROT 6.8 6.7  --  6.6  ALBUMIN 3.7 3.6  --  3.4*   No results for input(s): LIPASE, AMYLASE in the last 168 hours. No results for input(s): AMMONIA in the last 168 hours.  CBC: Recent Labs  Lab 01/26/20 1300 01/27/20 0718 01/28/20 0525 01/29/20 0533  WBC 7.6 7.8 7.3 5.7  NEUTROABS  --   --  5.3 3.6  HGB 7.1* 6.6* 7.6* 7.2*  HCT 22.9* 21.1* 23.3* 22.6*  MCV 104.1* 106.0* 100.4* 101.8*  PLT 63* 50* 45* 43*    Cardiac Enzymes: Recent Labs  Lab 01/29/20 1115  CKTOTAL 93    BNP: Invalid input(s): POCBNP  CBG: Recent Labs  Lab 01/28/20 1203 01/28/20 1608 01/28/20 1932 01/29/20 0832 01/29/20 1209  GLUCAP 168* 212* 220* 198* 190*    Microbiology: Results for orders placed or performed during the hospital encounter of 01/26/20  Body fluid culture     Status: None  (Preliminary result)   Collection Time: 01/26/20  3:03 PM   Specimen: KNEE; Body Fluid  Result Value Ref Range Status   Specimen Description   Final    KNEE Performed at [facility], [address], [place], [place] [address]    Special Requests   Final    NONE Performed at [facility], [address] [place], [place] [address]    Gram Stain   Final    FEW WBC PRESENT, PREDOMINANTLY PMN NO ORGANISMS SEEN    Culture   Final    NO GROWTH 3 DAYS Performed at [facility], [place] [address] [place], [place] [address]    Report Status PENDING  Incomplete  Resp Panel by RT-PCR (Flu A&B, Covid) Nasopharyngeal Swab     Status: None   Collection Time: 01/26/20  4:01 PM   Specimen: Nasopharyngeal Swab; Nasopharyngeal(NP) swabs in vial transport medium  Result Value Ref Range Status   SARS Coronavirus 2 by RT PCR NEGATIVE NEGATIVE Final    Comment: (NOTE) SARS-CoV-2 target nucleic acids are NOT DETECTED.  The SARS-CoV-2 RNA is generally detectable in upper respiratory specimens during the acute phase of infection. The lowest concentration of SARS-CoV-2 viral copies this assay can detect is 138 copies/mL. A negative result does not preclude SARS-Cov-2 infection and should not be used as the sole basis for treatment or other patient management decisions. A negative result may occur with  improper specimen collection/handling, submission of specimen other than nasopharyngeal swab, presence of viral mutation(s) within the areas targeted by this assay, and inadequate number of viral copies(<138 copies/mL). A negative result must be combined with clinical observations, patient history, and epidemiological information. The expected result is Negative.  Fact Sheet for Patients:  EntrepreneurPulse.com.au  Fact Sheet for Healthcare Providers:  IncredibleEmployment.be  This test is no t yet approved or cleared by the [place] FDA and  has been authorized for detection and/or diagnosis of SARS-CoV-2 by FDA under an Emergency Use Authorization (  EUA). This EUA will remain  in effect (meaning this test can be used) for the duration of the COVID-19 declaration under Section 564(b)(1) of the Act, 21 U.S.C.section 360bbb-3(b)(1), unless the authorization is terminated  or revoked sooner.       Influenza A by PCR NEGATIVE NEGATIVE Final   Influenza B by PCR NEGATIVE NEGATIVE Final    Comment: (NOTE) The Xpert Xpress SARS-CoV-2/FLU/RSV plus assay is intended as an aid in the diagnosis of influenza from Nasopharyngeal swab specimens and should not be used as a sole basis for treatment. Nasal washings and aspirates are unacceptable for Xpert Xpress SARS-CoV-2/FLU/RSV testing.  Fact Sheet for Patients: EntrepreneurPulse.com.au  Fact Sheet for Healthcare Providers: IncredibleEmployment.be  This test is not yet approved or cleared by the [place] FDA and has been authorized for detection and/or diagnosis of SARS-CoV-2 by FDA under an Emergency Use Authorization (EUA). This EUA will remain in effect (meaning this test can be used) for the duration of the COVID-19 declaration under Section 564(b)(1) of the Act, 21 U.S.C. section 360bbb-3(b)(1), unless the authorization is terminated or revoked.  Performed at [facility], [address] [place], [place] [address]     Coagulation Studies: No results for input(s): LABPROT, INR in the last 72 hours.  Urinalysis: No results for input(s): COLORURINE, LABSPEC, PHURINE, GLUCOSEU, HGBUR, BILIRUBINUR, KETONESUR, PROTEINUR, UROBILINOGEN, NITRITE, LEUKOCYTESUR in the last 72 hours.  Invalid input(s): APPERANCEUR    Imaging:  No results found.   Assessment & Plan: Evan [name] is a 80 y.o. white male with myelodysplastic syndrome, diabetes mellitus type II, CVA, sick sinus syndrome status post  pacemaker, status post TAVR, hypertension, atrial fibrillation, hearing loss, hyperlipidemia, systolic congestive heart failure, hepatic cirrhosis who was admitted to [facility] on 01/26/2020 for Acute on chronic diastolic heart failure (HCC) [I50.33] Peripheral edema [R60.9] Weakness [R53.1] Gout, unspecified cause, unspecified chronicity, unspecified site [M10.9]  1. Acute kidney injury: on chronic kidney disease stage IIIB: with proteinuria on urinalysis on admission.  Baseline creatinine of 1.59, GFR of 44 on 01/06/2020.  Chronic kidney disease secondary to hypertension and diabetes.  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  No recent IV contrast exposure.  - holding benazepril - Check renal ultrasound  2. Hypertension, atrial fibrillation and acute exacerbation of systolic congestive heart failure - IV furosemide  3. Acute gout flare: secondary to hyperuricemia - started on prednisone  4. Hyperuricemia: etiology could be secondary to myelodysplastic syndrome itself and not tumor lysis syndrome. Checking G6PD. May need rasburicase.   5. Anemia with chronic kidney disease: recent PRBC transfusion on this admission.   LOS: 3 Chiron Campione 1/5/20223:27 PM

## 2020-01-29 NOTE — Plan of Care (Signed)

## 2020-01-29 NOTE — Progress Notes (Signed)
Inpatient Diabetes Program Recommendations  AACE/ADA: New Consensus Statement on Inpatient Glycemic Control (2015)  Target Ranges:  Prepandial:   less than 140 mg/dL      Peak postprandial:   less than 180 mg/dL (1-2 hours)      Critically ill patients:  140 - 180 mg/dL   Lab Results  Component Value Date   GLUCAP 190 (H) 01/29/2020   HGBA1C 6.6 (H) 12/07/2019    Review of Glycemic Control Results for Evan [name], Evan [name] (MRN [id number]) as of 01/29/2020 12:38  Ref. Range 01/28/2020 12:03 01/28/2020 16:08 01/28/2020 19:32 01/29/2020 08:32 01/29/2020 12:09  Glucose-Capillary Latest Ref Range: 70 - 99 mg/dL 656 (H) 812 (H) 751 (H) 198 (H) 190 (H)   Diabetes history: DM 2 Outpatient Diabetes medications:  Amaryl 2 mg daily Current orders for Inpatient glycemic control:  Novolog 0-6 units tid with meals   Inpatient Diabetes Program Recommendations:    If appropriate, consider adding Levemir 5 units daily.   Thanks  Beryl Meager, RN, BC-ADM Inpatient Diabetes Coordinator Pager [phone] (8a-5p)

## 2020-01-30 ENCOUNTER — Inpatient Hospital Stay: Payer: Medicare Other

## 2020-01-30 DIAGNOSIS — I5023 Acute on chronic systolic (congestive) heart failure: Secondary | ICD-10-CM | POA: Diagnosis not present

## 2020-01-30 LAB — COMPREHENSIVE METABOLIC PANEL
ALT: 103 U/L — ABNORMAL HIGH (ref 0–44)
AST: 40 U/L (ref 15–41)
Albumin: 3.5 g/dL (ref 3.5–5.0)
Alkaline Phosphatase: 95 U/L (ref 38–126)
Anion gap: 12 (ref 5–15)
BUN: 92 mg/dL — ABNORMAL HIGH (ref 8–23)
CO2: 34 mmol/L — ABNORMAL HIGH (ref 22–32)
Calcium: 8.7 mg/dL — ABNORMAL LOW (ref 8.9–10.3)
Chloride: 93 mmol/L — ABNORMAL LOW (ref 98–111)
Creatinine, Ser: 2 mg/dL — ABNORMAL HIGH (ref 0.61–1.24)
GFR, Estimated: 34 mL/min — ABNORMAL LOW (ref >60)
Glucose, Bld: 191 mg/dL — ABNORMAL HIGH (ref 70–99)
Potassium: 3.9 mmol/L (ref 3.5–5.1)
Sodium: 139 mmol/L (ref 135–145)
Total Bilirubin: 4 mg/dL — ABNORMAL HIGH (ref 0.3–1.2)
Total Protein: 6.5 g/dL (ref 6.5–8.1)

## 2020-01-30 LAB — CBC
HCT: 22.7 % — ABNORMAL LOW (ref 39.0–52.0)
Hemoglobin: 7.3 g/dL — ABNORMAL LOW (ref 13.0–17.0)
MCH: 33 pg (ref 26.0–34.0)
MCHC: 32.2 g/dL (ref 30.0–36.0)
MCV: 102.7 fL — ABNORMAL HIGH (ref 80.0–100.0)
Platelets: 44 10*3/uL — ABNORMAL LOW (ref 150–400)
RBC: 2.21 MIL/uL — ABNORMAL LOW (ref 4.22–5.81)
RDW: 23.1 % — ABNORMAL HIGH (ref 11.5–15.5)
WBC: 4.6 10*3/uL (ref 4.0–10.5)
nRBC: 2.4 % — ABNORMAL HIGH (ref 0.0–0.2)

## 2020-01-30 LAB — GLUCOSE, CAPILLARY
Glucose-Capillary: 163 mg/dL — ABNORMAL HIGH (ref 70–99)
Glucose-Capillary: 173 mg/dL — ABNORMAL HIGH (ref 70–99)
Glucose-Capillary: 245 mg/dL — ABNORMAL HIGH (ref 70–99)
Glucose-Capillary: 237 mg/dL — ABNORMAL HIGH (ref 70–99)

## 2020-01-30 LAB — BODY FLUID CULTURE: Culture: NO GROWTH

## 2020-01-30 LAB — GLUCOSE 6 PHOSPHATE DEHYDROGENASE
G6PDH: 13.7 U/g{Hb} (ref 4.8–15.7)
Hemoglobin: 7.6 g/dL — ABNORMAL LOW (ref 13.0–17.7)

## 2020-01-30 LAB — MAGNESIUM: Magnesium: 3 mg/dL — ABNORMAL HIGH (ref 1.7–2.4)

## 2020-01-30 LAB — PHOSPHORUS: Phosphorus: 4.8 mg/dL — ABNORMAL HIGH (ref 2.5–4.6)

## 2020-01-30 MED ORDER — FUROSEMIDE 10 MG/ML IJ SOLN
60.0000 mg | Freq: Two times a day (BID) | INTRAMUSCULAR | Status: DC
  Administered 2020-01-30 – 2020-01-31 (×3): 60 mg via INTRAVENOUS
  Filled 2020-01-30 (×3): qty 6
  Filled 2020-01-30: qty 8

## 2020-01-30 NOTE — Progress Notes (Signed)
Physical Therapy Treatment Patient Details Name: Evan [name] MRN: [id number] DOB: 1941/01/03 Today's Date: 01/30/2020    History of Present Illness Evan [name] is a 80 y.o. 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 on 01/26/2020 with acute on chronic systolic heart failure after presenting from home to [facility] Emergency Department complaining of swelling in the bilateral lower extremities.    PT Comments    Patient received in bed, lethargic. Reports he was told he shouldn't get up. With encouragement patient agrees to work with PT and Tech assist. Patient requires mod/max assist +2 for supine to sit with cues for hand placement and technique. Patient is able to sit unsupported once positioned. Attempted standing x 2 from elevated bed with +2 max assist, but patient unable to clear bottom from bed. Patient will continue to benefit from skilled PT while here to improve strength and functional independence. Patient is also limited by right knee pain with mobility and attempts at standing.       Follow Up Recommendations  SNF;Supervision/Assistance - 24 hour     Equipment Recommendations  Other (comment) (TBD)    Recommendations for Other Services       Precautions / Restrictions Precautions Precautions: Fall Restrictions Weight Bearing Restrictions: No    Mobility  Bed Mobility Overal bed mobility: Needs Assistance Bed Mobility: Supine to Sit;Sit to Supine     Supine to sit: Max assist;+2 for physical assistance Sit to supine: Max assist;+2 for physical assistance    General bed mobility comments: Patient reaching for therapist's shirt to pull himself up, cues given for patient to use bed rail  Transfers Overall transfer level: Needs assistance Equipment used: Rolling walker (2 wheeled) Transfers: Sit to/from Stand Sit to Stand: Max assist;+2 physical assistance;From elevated surface         General transfer comment: Patient unable to stand on 2 attempts with max +2 assist from elevated bed. Does not seem  to be trying much  Ambulation/Gait             General Gait Details: unable   Stairs             Wheelchair Mobility    Modified Rankin (Stroke Patients Only)       Balance Overall balance assessment: Needs assistance Sitting-balance support: Feet supported Sitting balance-Leahy Scale: Fair Sitting balance - Comments: patient able to sit unsupported at edge of bed.       Standing balance comment: unable                            Cognition Arousal/Alertness: Lethargic Behavior During Therapy: WFL for tasks assessed/performed Overall Cognitive Status: Difficult to assess                                 General Comments: Difficult to assess as his wife will answer questions for him. He is HOH which also makes it difficult to assess      Exercises      General Comments        Pertinent Vitals/Pain  Pain Assessment: Faces Faces Pain Scale: Hurts whole lot Pain Location: R knee to touch and with any movement. Pain Descriptors / Indicators: Discomfort;Moaning;Sore;Guarding;Grimacing Pain Intervention(s): Limited activity within patient's tolerance;Monitored during session;Repositioned    Home Living                      Prior Function            PT Goals (current goals can now be found in the care plan section) Acute Rehab PT Goals Patient Stated Goal: to be able to go home after hospitalization PT Goal Formulation: With patient/family Time For Goal Achievement:  02/11/20 Potential to Achieve Goals: Poor Progress towards PT goals: Not progressing toward goals - comment (continues to be unable to stand)    Frequency    Min 2X/week      PT Plan Current plan remains appropriate    Co-evaluation              AM-PAC PT "6 Clicks" Mobility   Outcome Measure  Help needed turning from your back to your side while in a flat bed without using bedrails?: A Lot Help needed moving from lying on your back to sitting on the side of a flat bed without using bedrails?: A Lot Help needed moving to and from a bed to a chair (including a wheelchair)?: Total Help needed standing up from a chair using your arms (e.g., wheelchair or bedside chair)?: Total Help needed to walk in hospital room?: Total Help needed climbing 3-5 steps with a railing? : Total 6 Click Score: 8    End of Session Equipment Utilized During Treatment: Gait belt Activity Tolerance: Patient tolerated treatment well;Patient limited by pain;Patient limited by fatigue Patient left: in bed;with call bell/phone within reach;with family/visitor present Nurse Communication: Mobility status PT Visit Diagnosis: Unsteadiness on feet (R26.81);Other abnormalities of gait and mobility (R26.89);Muscle weakness (generalized) (M62.81);Pain Pain - Right/Left: Right Pain - part of body: Knee     Time: [id number] PT Time Calculation (min) (ACUTE ONLY): 30 min  Charges:  $Therapeutic Activity: 23-37 mins                     [employer], PT, GCS 01/30/20,3:22 PM

## 2020-01-30 NOTE — Progress Notes (Signed)
PT Cancellation Note  Patient Details Name: LADISLAV CASELLI MRN: [id number] DOB: 1940/04/30   Cancelled Treatment:    Reason Eval/Treat Not Completed: Patient declined, no reason specified. Patient reports "I don't think I can right now." Wife present in room. Will re-attempt later as time allows.    Shequila Neglia 01/30/2020, 10:45 AM

## 2020-01-30 NOTE — TOC Progression Note (Addendum)
Transition of Care [facility]) - Progression Note    Patient Details  Name: Evan [name] MRN: [id number] Date of Birth: 09/16/1940  Transition of Care [facility]) CM/SW Contact  Liliana Cline, LCSW Phone Number: 01/30/2020, 10:48 AM  Clinical Narrative:   OT recommending hospital bed and wheelchair (3in1 already ordered and delivered to bedside by [employer]). CSW spoke with patient's wife who is agreeable to additional DME being ordered. Referral to [employer] Representative Zach for additional DME. CSW asked MD for orders.     Expected Discharge Plan: Home w Home Health Services Barriers to Discharge: Continued Medical Work up  Expected Discharge Plan and Services Expected Discharge Plan: Home w Home Health Services       Living arrangements for the past 2 months: Single Family Home                                       Social Determinants of Health (SDOH) Interventions    Readmission Risk Interventions Readmission Risk Prevention Plan 01/27/2020  Transportation Screening Complete  PCP or Specialist Appt within 3-5 Days Complete  HRI or Home Care Consult Complete  Social Work Consult for Recovery Care Planning/Counseling Complete  Palliative Care Screening Not Applicable  Medication Review Oceanographer) Complete  Some recent data might be hidden

## 2020-01-30 NOTE — Progress Notes (Signed)
[facility]  ROUNDING NOTE   Subjective:   UOP Creatinine 2 (2.18)  Evan [name] at bedside.   Objective:  Vital signs in last 24 hours:  Temp:  [97.8 F (36.6 C)-98.6 F (37 C)] 98.2 F (36.8 C) (01/06 1048) Pulse Rate:  [63-74] 67 (01/06 1048) Resp:  [16-18] 17 (01/06 0430) BP: (131-141)/(58-70) 135/66 (01/06 1048) SpO2:  [94 %-99 %] 95 % (01/06 1048) Weight:  [110.9 kg] 110.9 kg (01/06 0500)  Weight change: -3.044 kg Filed Weights   01/28/20 0539 01/29/20 0700 01/30/20 0500  Weight: 115 kg 113.9 kg 110.9 kg    Intake/Output: I/O last 3 completed shifts: In: 600 [P.O.:600] Out: 5000 [Urine:5000]   Intake/Output this shift:  Total I/O In: -  Out: 550 [Urine:550]  Physical Exam: General: NAD,   Head: Normocephalic, atraumatic. Moist oral mucosal membranes  Eyes: Anicteric, PERRL  Neck: Supple, trachea midline  Lungs:  Clear to auscultation  Heart: Regular rate and rhythm  Abdomen:  Soft, nontender,   Extremities:  trace peripheral edema.  Neurologic: Nonfocal, moving all four extremities  Skin: No lesions   Joints Right knee with effusion, nontender    Basic Metabolic Panel: Recent Labs  Lab 01/26/20 1300 01/26/20 1400 01/27/20 0718 01/28/20 0525 01/28/20 0810 01/29/20 0533 01/30/20 0654  NA 134*  --  135 137 136 137 139  K 5.1  --  5.1 4.3 4.4 4.1 3.9  CL 94*  --  96* 95* 94* 94* 93*  CO2 26  --  23 27 27 30  34*  GLUCOSE 150*  --  196* 167* 194* 202* 191*  BUN 45*  --  64* 83* 82* 91* 92*  CREATININE 2.35*  --  2.60* 2.41* 2.38* 2.18* 2.00*  CALCIUM 9.0  --  9.0 9.0 8.9  8.9 8.9 8.7*  MG 2.3  --  2.5*  --   --   --  3.0*  PHOS  --  4.7* 5.6* 6.1*  --  5.7* 4.8*    Liver Function Tests: Recent Labs  Lab 01/27/20 0718 01/28/20 0525 01/28/20 0810 01/28/20 1414 01/29/20 0533 01/30/20 0654  AST 133* 68* 59*  --  42* 40  ALT 112* 128* 122*  --  107* 103*  ALKPHOS 112 102 98  --  94 95  BILITOT 5.5* 5.1* 5.0* 5.2* 4.1* 4.0*   PROT 6.9 6.8 6.7  --  6.6 6.5  ALBUMIN 3.6 3.7 3.6  --  3.4* 3.5   No results for input(s): LIPASE, AMYLASE in the last 168 hours. No results for input(s): AMMONIA in the last 168 hours.  CBC: Recent Labs  Lab 01/26/20 1300 01/27/20 0718 01/28/20 0525 01/28/20 1455 01/29/20 0533 01/30/20 0654  WBC 7.6 7.8 7.3  --  5.7 4.6  NEUTROABS  --   --  5.3  --  3.6  --   HGB 7.1* 6.6* 7.6* 13.0 7.2* 7.3*  HCT 22.9* 21.1* 23.3*  --  22.6* 22.7*  MCV 104.1* 106.0* 100.4*  --  101.8* 102.7*  PLT 63* 50* 45*  --  43* 44*    Cardiac Enzymes: Recent Labs  Lab 01/29/20 1115  CKTOTAL 93    BNP: Invalid input(s): POCBNP  CBG: Recent Labs  Lab 01/29/20 1209 01/29/20 1648 01/29/20 2157 01/30/20 0816 01/30/20 1113  GLUCAP 190* 217* 243* 163* 173*    Microbiology: Results for orders placed or performed during the hospital encounter of 01/26/20  Body fluid culture     Status: None  Collection Time: 01/26/20  3:03 PM   Specimen: KNEE; Body Fluid  Result Value Ref Range Status   Specimen Description   Final    KNEE Performed at [facility], [address], [place], [place] [address]    Special Requests   Final    NONE Performed at [facility], [address] [place], [place] [address]    Gram Stain   Final    FEW WBC PRESENT, PREDOMINANTLY PMN NO ORGANISMS SEEN    Culture   Final    NO GROWTH Performed at [facility], [place] [address] [place], [place] [address]    Report Status 01/30/2020 FINAL  Final  Resp Panel by RT-PCR (Flu A&B, Covid) Nasopharyngeal Swab     Status: None   Collection Time: 01/26/20  4:01 PM   Specimen: Nasopharyngeal Swab; Nasopharyngeal(NP) swabs in vial transport medium  Result Value Ref Range Status   SARS Coronavirus 2 by RT PCR NEGATIVE NEGATIVE Final    Comment: (NOTE) SARS-CoV-2 target nucleic acids are NOT DETECTED.  The SARS-CoV-2 RNA is generally detectable in upper respiratory specimens during the acute  phase of infection. The lowest concentration of SARS-CoV-2 viral copies this assay can detect is 138 copies/mL. A negative result does not preclude SARS-Cov-2 infection and should not be used as the sole basis for treatment or other patient management decisions. A negative result may occur with  improper specimen collection/handling, submission of specimen other than nasopharyngeal swab, presence of viral mutation(s) within the areas targeted by this assay, and inadequate number of viral copies(<138 copies/mL). A negative result must be combined with clinical observations, patient history, and epidemiological information. The expected result is Negative.  Fact Sheet for Patients:  EntrepreneurPulse.com.au  Fact Sheet for Healthcare Providers:  IncredibleEmployment.be  This test is no t yet approved or cleared by the [place] FDA and  has been authorized for detection and/or diagnosis of SARS-CoV-2 by FDA under an Emergency Use Authorization (EUA). This EUA will remain  in effect (meaning this test can be used) for the duration of the COVID-19 declaration under Section 564(b)(1) of the Act, 21 U.S.C.section 360bbb-3(b)(1), unless the authorization is terminated  or revoked sooner.       Influenza A by PCR NEGATIVE NEGATIVE Final   Influenza B by PCR NEGATIVE NEGATIVE Final    Comment: (NOTE) The Xpert Xpress SARS-CoV-2/FLU/RSV plus assay is intended as an aid in the diagnosis of influenza from Nasopharyngeal swab specimens and should not be used as a sole basis for treatment. Nasal washings and aspirates are unacceptable for Xpert Xpress SARS-CoV-2/FLU/RSV testing.  Fact Sheet for Patients: EntrepreneurPulse.com.au  Fact Sheet for Healthcare Providers: IncredibleEmployment.be  This test is not yet approved or cleared by the [place] FDA and has been authorized for detection and/or diagnosis of  SARS-CoV-2 by FDA under an Emergency Use Authorization (EUA). This EUA will remain in effect (meaning this test can be used) for the duration of the COVID-19 declaration under Section 564(b)(1) of the Act, 21 U.S.C. section 360bbb-3(b)(1), unless the authorization is terminated or revoked.  Performed at [facility], [address] [place], [place] [address]     Coagulation Studies: No results for input(s): LABPROT, INR in the last 72 hours.  Urinalysis: No results for input(s): COLORURINE, LABSPEC, PHURINE, GLUCOSEU, HGBUR, BILIRUBINUR, KETONESUR, PROTEINUR, UROBILINOGEN, NITRITE, LEUKOCYTESUR in the last 72 hours.  Invalid input(s): APPERANCEUR    Imaging: DG Chest 1 View  Result Date: 01/30/2020 CLINICAL DATA:  Increasing lower extremity edema EXAM: CHEST  1 VIEW COMPARISON:  01/26/2020 FINDINGS: Cardiac shadow is enlarged. Aortic calcifications are noted. Postsurgical changes are again seen. Pacing device is again noted. Increasing vascular congestion with interstitial edema is noted worsened from the prior exam. Small left pleural effusion is noted. No bony abnormality is seen. IMPRESSION: Increasing CHF when compared with the prior exam Electronically Signed   By: Inez Catalina M.D.   On: 01/30/2020 09:08   US RENAL  Result Date: 01/30/2020 CLINICAL DATA:  Worsening renal function. EXAM: RENAL / URINARY TRACT ULTRASOUND COMPLETE COMPARISON:  None. FINDINGS: Right Kidney: Renal measurements: 10.9 x 6.7 x 6.3 cm = volume: 242 mL. Increased echogenicity of renal parenchyma is noted suggesting medical renal disease. No mass or hydronephrosis visualized. Left Kidney: Renal measurements: 10.9 x 6.5 x 5.5 cm = volume: 204 mL. Increased echogenicity of renal parenchyma is noted suggesting medical renal disease. No mass or hydronephrosis visualized. Bladder: Appears normal for degree of bladder distention. Other: None. IMPRESSION: Increased echogenicity of renal parenchyma is noted  suggesting medical renal disease. No hydronephrosis or renal obstruction is noted. Electronically Signed   By: Marijo Conception M.D.   On: 01/30/2020 09:01     Medications:    . apixaban  2.5 mg Oral BID  . calcium acetate  667 mg Oral TID WC  . docusate sodium  100 mg Oral Daily  . furosemide  60 mg Intravenous BID  . hydrALAZINE  25 mg Oral BID  . insulin aspart  0-6 Units Subcutaneous TID WC  . lidocaine  1 patch Transdermal Q24H  . polyethylene glycol  17 g Oral Daily  . predniSONE  50 mg Oral Q breakfast  . tamsulosin  0.4 mg Oral QPC supper  . cyanocobalamin  1,000 mcg Oral Daily   acetaminophen **OR** acetaminophen, HYDROcodone-acetaminophen, naLOXone (NARCAN)  injection, prochlorperazine  Assessment/ Plan:  Evan [name] is a 80 y.o. white male with myelodysplastic syndrome, diabetes mellitus type II, CVA, sick sinus syndrome status post pacemaker, status post TAVR, hypertension, atrial fibrillation, hearing loss, hyperlipidemia, systolic congestive heart failure, hepatic cirrhosis who was admitted to [facility] on 01/26/2020 for Acute on chronic diastolic heart failure (HCC) [I50.33] Peripheral edema [R60.9] Weakness [R53.1] Gout, unspecified cause, unspecified chronicity, unspecified site [M10.9]  1. Acute kidney injury: on chronic kidney disease stage IIIB: with proteinuria on urinalysis on admission.  Baseline creatinine of 1.59, GFR of 44 on 01/06/2020.  Chronic kidney disease secondary to hypertension and diabetes.  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  No recent IV contrast exposure.  - holding benazepril - creatinine trending downward.   2. Hypertension, atrial fibrillation and acute exacerbation of systolic congestive heart failure - IV furosemide - tamsulosin  3. Acute gout flare: secondary to hyperuricemia - started on prednisone  4. Hyperuricemia: etiology could be secondary to myelodysplastic  syndrome itself.  Rasburicase as per oncology  5. Anemia with chronic kidney disease: recent PRBC transfusion on this admission.    LOS: [place] 1/6/20222:40 PM

## 2020-01-30 NOTE — Progress Notes (Signed)
Hematology/Oncology Consult note [facility]  [phone] [phone]  Patient Care Team: Leonel Ramsay, MD as PCP - General (Infectious Diseases) [name], [name] M, MD as PCP - Cardiology (Cardiology) Constance Haw, MD as PCP - Electrophysiology (Cardiology) Cammie Sickle, MD as Consulting Physician (Hematology and Oncology)   Name of the patient: Evan [name]  XN:[id number]  04-21-1940   Date of visit: 01/30/2020  Interval history-he is continuing to do poorly.Ongoing knee pain from gout  Has ongoing fatigue and exertional shortness of breath.  He has not been able to ambulate much due to his  ECOG PS- 3-4   Review of systems- Review of Systems  Unable to perform ROS: Other    Limited due to patient fatigue   Allergies  Allergen Reactions  . Niacin Other (See Comments) and Rash    Red face and burns.  . Niacin And Related Rash and Other (See Comments)    Red face and burns.  . Metformin And Related Diarrhea  . Statins Other (See Comments)    Myalgias, weakness     Past Medical History:  Diagnosis Date  . Anemia   . Carotid arterial disease ([place])   . CHF (congestive heart failure) ([place])   . Coronary artery disease   . Hearing loss   . HOH (hard of hearing)   . Hyperlipidemia   . Hypertension   . Nocturia   . Obesity   . Osteoarthritis    "BUE; BLE; right knee" (03/23/2017)  . Peripheral vision loss, bilateral    S/P 07/2015  . Permanent atrial fibrillation ([place])   . 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 Severe aortic stenosis   . Sinus drainage   . Stroke [facility]) 07/2015   "peripheral vision still bad out of left eye" (03/23/2017)  . Torn rotator cuff 2012   Right arm  . Type II diabetes mellitus ([place])   . Urinary frequency      Past Surgical History:  Procedure Laterality Date  . BACK SURGERY    . CARDIAC CATHETERIZATION  11/29/2005   SEVERE THREE VESSEL OBSTRUCTIVE  ATHERSCLEROTIC CAD. ALL GRAFTS WERE PATENT. EF 65%  . CARDIAC CATHETERIZATION  03/23/2017  . CAROTID ENDARTERECTOMY Right 01-29-08   cea  . CARPAL TUNNEL RELEASE Right   . COLONOSCOPY N/A 12/10/2019   Procedure: COLONOSCOPY;  Surgeon: Wilford Corner, MD;  Location: [facility] ENDOSCOPY;  Service: Endoscopy;  Laterality: N/A;  . CORONARY ARTERY BYPASS GRAFT  03/2003   X3. LIMA GRAFT TO THE LAD, SAPHENOUS VEIN GRAFT TO THE PA, AND A LEFT RADIAL GRAFT TO THEOBTUSE MARGINAL VESSEL  . ENDARTERECTOMY Left 05/28/2012   Procedure: ENDARTERECTOMY CAROTID;  Surgeon: Elam Dutch, MD;  Location: [place];  Service: Vascular;  Laterality: Left;  . ESOPHAGOGASTRODUODENOSCOPY N/A 12/10/2019   Procedure: ESOPHAGOGASTRODUODENOSCOPY (EGD);  Surgeon: Wilford Corner, MD;  Location: [place];  Service: Endoscopy;  Laterality: N/A;  . HEMOSTASIS CLIP PLACEMENT  12/10/2019   Procedure: HEMOSTASIS CLIP PLACEMENT;  Surgeon: Wilford Corner, MD;  Location: [place] ENDOSCOPY;  Service: Endoscopy;;  . INSERT / REPLACE / REMOVE PACEMAKER  03/23/2017  . [place]; 1984  . MOLE SURGERY     "? face/arms"  . PACEMAKER IMPLANT N/A 03/23/2017   Procedure: PACEMAKER IMPLANT;  Surgeon: Constance Haw, MD;  Location: [place] CV LAB;  Service: Cardiovascular;  Laterality: N/A;  . PATCH ANGIOPLASTY Left 05/28/2012   Procedure: WITH dACRON PATCH ANGIOPLASTY;  Surgeon: Elam Dutch, MD;  Location: [place];  Service: Vascular;  Laterality: Left;  . POLYPECTOMY  12/10/2019   Procedure: POLYPECTOMY;  Surgeon: Wilford Corner, MD;  Location: [facility] ENDOSCOPY;  Service: Endoscopy;;  . RIGHT/LEFT HEART CATH AND CORONARY/GRAFT ANGIOGRAPHY N/A 03/23/2017   Procedure: RIGHT/LEFT HEART CATH AND CORONARY/GRAFT ANGIOGRAPHY;  Surgeon: Sherren Mocha, MD;  Location: [place] CV LAB;  Service: Cardiovascular;  Laterality: N/A;  . TEE WITHOUT CARDIOVERSION N/A 05/09/2017   Procedure: TRANSESOPHAGEAL ECHOCARDIOGRAM (TEE);   Surgeon: Sherren Mocha, MD;  Location: [place];  Service: Open Heart Surgery;  Laterality: N/A;  . TRANSCATHETER AORTIC VALVE REPLACEMENT, TRANSFEMORAL N/A 05/09/2017   Procedure: TRANSCATHETER AORTIC VALVE REPLACEMENT, TRANSFEMORAL using an Edwards 26mm Sapien 3 Aortic Valve;  Surgeon: Sherren Mocha, MD;  Location: [place];  Service: Open Heart Surgery;  Laterality: N/A;    Social History   Socioeconomic History  . Marital status: Married    Spouse name: Not on file  . Number of children: 5  . Years of education: Not on file  . Highest education level: Not on file  Occupational History  . Occupation: retired    Fish farm manager: RETIRED  Tobacco Use  . Smoking status: Former Smoker    Packs/day: 2.00    Years: 39.00    Pack years: 78.00    Types: Cigarettes    Quit date: 01/24/1994    Years since quitting: 26.0  . Smokeless tobacco: Never Used  Vaping Use  . Vaping Use: Never used  Substance and Sexual Activity  . Alcohol use: No  . Drug use: No  . Sexual activity: Never  Other Topics Concern  . Not on file  Social History Narrative   On Farm/ in [place]; no alcohol; quit smoking- 96; worked in Estate agent.    Social Determinants of Health   Financial Resource Strain: Not on file  Food Insecurity: Not on file  Transportation Needs: Not on file  Physical Activity: Not on file  Stress: Not on file  Social Connections: Not on file  Intimate Partner Violence: Not on file    Family History  Problem Relation Age of Onset  . Heart attack Father   . Heart attack Brother   . Parkinsonism Brother   . Cancer Brother   . Arrhythmia Brother      Current Facility-Administered Medications:  .  acetaminophen (TYLENOL) tablet 650 mg, 650 mg, Oral, Q6H PRN, 650 mg at 01/30/20 1222 **OR** acetaminophen (TYLENOL) suppository 650 mg, 650 mg, Rectal, Q6H PRN, Howerter, Justin B, DO .  apixaban (ELIQUIS) tablet 2.5 mg, 2.5 mg, Oral, BID, Adhikari, Amrit, MD, 2.5 mg at 01/30/20  0950 .  calcium acetate (PHOSLO) capsule 667 mg, 667 mg, Oral, TID WC, Adhikari, Amrit, MD, 667 mg at 01/30/20 1213 .  docusate sodium (COLACE) capsule 100 mg, 100 mg, Oral, Daily, Pahwani, Rinka R, MD, 100 mg at 01/30/20 0949 .  furosemide (LASIX) injection 60 mg, 60 mg, Intravenous, BID, Pahwani, Rinka R, MD .  hydrALAZINE (APRESOLINE) tablet 25 mg, 25 mg, Oral, BID, Howerter, Justin B, DO, 25 mg at 01/30/20 0949 .  HYDROcodone-acetaminophen (NORCO) 7.5-325 MG per tablet 1 tablet, 1 tablet, Oral, Q6H PRN, Shelly Coss, MD, 1 tablet at 01/29/20 0424 .  insulin aspart (novoLOG) injection 0-6 Units, 0-6 Units, Subcutaneous, TID WC, Howerter, Justin B, DO, 1 Units at 01/30/20 1213 .  lidocaine (LIDODERM) 5 % 1 patch, 1 patch, Transdermal, Q24H, Adhikari, Amrit, MD, 1 patch at 01/30/20 1214 .  naloxone [facility]) injection 0.4 mg, 0.4 mg, Intravenous, PRN, Shelly Coss, MD, 0.4 mg at 01/27/20 1903 .  polyethylene glycol (MIRALAX / GLYCOLAX) packet 17 g, 17 g, Oral, Daily, Pahwani, Rinka R, MD, 17 g at 01/30/20 0952 .  predniSONE (DELTASONE) tablet 50 mg, 50 mg, Oral, Q breakfast, Adhikari, Amrit, MD, 50 mg at 01/30/20 0951 .  prochlorperazine (COMPAZINE) tablet 10 mg, 10 mg, Oral, Q6H PRN, Howerter, Justin B, DO .  tamsulosin (FLOMAX) capsule 0.4 mg, 0.4 mg, Oral, QPC supper, Howerter, Justin B, DO, 0.4 mg at 01/29/20 1701 .  vitamin B-12 (CYANOCOBALAMIN) tablet 1,000 mcg, 1,000 mcg, Oral, Daily, Howerter, Justin B, DO, 1,000 mcg at 01/30/20 0949  Physical exam:  Vitals:   01/30/20 0430 01/30/20 0500 01/30/20 0755 01/30/20 1048  BP: (!) 141/66  134/70 135/66  Pulse: 65  70 67  Resp: 17     Temp: 97.8 F (36.6 C)  98.1 F (36.7 C) 98.2 F (36.8 C)  TempSrc:   Oral   SpO2: 96%  94% 95%  Weight:  244 lb 7.8 oz (110.9 kg)    Height:       Physical Exam Constitutional:      Comments: Appears deconditioned and fatigued he is on 2 L of oxygen  HENT:     Head:     Comments: Mild scleral  icterus Eyes:     Extraocular Movements: EOM normal.  Cardiovascular:     Rate and Rhythm: Normal rate and regular rhythm.     Heart sounds: Normal heart sounds.  Pulmonary:     Comments: Effort of breathing increased Musculoskeletal:     Cervical back: Normal range of motion.  Skin:    General: Skin is warm and dry.  Neurological:     Mental Status: He is alert and oriented to person, place, and time.      CMP Latest Ref Rng & Units 01/30/2020  Glucose 70 - 99 mg/dL 191(H)  BUN 8 - 23 mg/dL 92(H)  Creatinine 0.61 - 1.24 mg/dL 2.00(H)  Sodium 135 - 145 mmol/L 139  Potassium 3.5 - 5.1 mmol/L 3.9  Chloride 98 - 111 mmol/L 93(L)  CO2 22 - 32 mmol/L 34(H)  Calcium 8.9 - 10.3 mg/dL 8.7(L)  Total Protein 6.5 - 8.1 g/dL 6.5  Total Bilirubin 0.3 - 1.2 mg/dL 4.0(H)  Alkaline Phos 38 - 126 U/L 95  AST 15 - 41 U/L 40  ALT 0 - 44 U/L 103(H)   CBC Latest Ref Rng & Units 01/30/2020  WBC 4.0 - 10.5 K/uL 4.6  Hemoglobin 13.0 - 17.0 g/dL 7.3(L)  Hematocrit 39.0 - 52.0 % 22.7(L)  Platelets 150 - 400 K/uL 44(L)    @IMAGES @  DG Chest 1 View  Result Date: 01/30/2020 CLINICAL DATA:  Increasing lower extremity edema EXAM: CHEST  1 VIEW COMPARISON:  01/26/2020 FINDINGS: Cardiac shadow is enlarged. Aortic calcifications are noted. Postsurgical changes are again seen. Pacing device is again noted. Increasing vascular congestion with interstitial edema is noted worsened from the prior exam. Small left pleural effusion is noted. No bony abnormality is seen. IMPRESSION: Increasing CHF when compared with the prior exam Electronically Signed   By: Inez Catalina M.D.   On: 01/30/2020 09:08   DG Chest 2 View  Result Date: 01/26/2020 CLINICAL DATA:  Shortness of breath EXAM: CHEST - 2 VIEW COMPARISON:  December 07, 2019 FINDINGS: The mediastinal contour and cardiac silhouette are stable. Cardiac pacemaker is unchanged. The heart size is enlarged. Diffuse increased  pulmonary interstitium is identified  bilaterally. There is no pleural effusion or focal pneumonia. The bony structures are stable. IMPRESSION: Congestive heart failure. Electronically Signed   By: Abelardo Diesel M.D.   On: 01/26/2020 15:51   DG Knee 2 Views Right  Result Date: 01/26/2020 CLINICAL DATA:  Bilateral leg weakness EXAM: RIGHT KNEE - 1-2 VIEW COMPARISON:  None. FINDINGS: Degenerative changes with joint space narrowing and spurring most pronounced in the patellofemoral compartment. Small joint effusion. No acute bony abnormality. Specifically, no fracture, subluxation, or dislocation. Dense vascular calcifications. IMPRESSION: Degenerative changes as above. Small joint effusion. No acute bony abnormality. Electronically Signed   By: Rolm Baptise M.D.   On: 01/26/2020 14:02   US RENAL  Result Date: 01/30/2020 CLINICAL DATA:  Worsening renal function. EXAM: RENAL / URINARY TRACT ULTRASOUND COMPLETE COMPARISON:  None. FINDINGS: Right Kidney: Renal measurements: 10.9 x 6.7 x 6.3 cm = volume: 242 mL. Increased echogenicity of renal parenchyma is noted suggesting medical renal disease. No mass or hydronephrosis visualized. Left Kidney: Renal measurements: 10.9 x 6.5 x 5.5 cm = volume: 204 mL. Increased echogenicity of renal parenchyma is noted suggesting medical renal disease. No mass or hydronephrosis visualized. Bladder: Appears normal for degree of bladder distention. Other: None. IMPRESSION: Increased echogenicity of renal parenchyma is noted suggesting medical renal disease. No hydronephrosis or renal obstruction is noted. Electronically Signed   By: Marijo Conception M.D.   On: 01/30/2020 09:01     Assessment and plan- Patient is a 80 y.o. 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  He was started on Revlimid as an outpatient but presently his  Thrombocytopenia is worse likely in the setting of CKD and have therefore held his Revlimid at this time.  I suspect thrombocytopenia secondary to revlimid. White cell count is normal and hemoglobin has been chronically low around 7 requiring intermittent blood transfusions.  Patient's wife endorses some improvement in energy when he receives blood transfusion and I will communicate with Dr. Lilyan Gilford to give him 1 unit of blood transfusion tomorrow.  Okay to hold Eliquis which she is taking for A. fib until platelet counts are consistently more than 50.  Acute gout flare in his right knee: Patient is currently on steroids.  Patient also noted to have hyperuricemia which I do not think is secondary to tumor lysis as low-grade MDS is unlikely to cause significant degree of TLS.  Also the hypokalemia, typically does not go with tumor lysis syndrome.  G6PD levels are currently pending and if they are normal it would be okay to give the patient 1 dose of rasburicase  Given patient's multiple comorbidities including heart failure, cirrhosis, CKD and MDS and his declining performance status his overall prognosis is poor.  Patient's wife remains hopeful and I explained to her that it is unlikely the patient's performance status is going to improve significantly in the near future.  She hopes to take the patient home instead of taking him to a nursing home or rehab.  I will have palliative care see the patient tomorrow to facilitate further goals of care.  Patient's wife is agreeable with that.   Visit Diagnosis 1. Weakness   2. Gout, unspecified cause, unspecified chronicity, unspecified site   3. Peripheral edema   4. Hyperuricemia   5. Worsening renal function   6. Cough with  hemoptysis      Dr. Owens Shark, MD, MPH [facility] [phone] 01/30/2020 9:40 PM

## 2020-01-30 NOTE — Plan of Care (Signed)

## 2020-01-30 NOTE — Care Management Important Message (Signed)
Important Message  Patient Details  Name: Evan [name] MRN: [id number] Date of Birth: 1940-11-08   Medicare Important Message Given:  Yes     Olegario Messier A Oryn Casanova 01/30/2020, 10:58 AM

## 2020-01-30 NOTE — Progress Notes (Addendum)
PROGRESS NOTE    Evan [name]  HYQ:[id number] DOB: 01/04/1941 DOA: 01/26/2020 PCP: Mick Sell, MD   Brief Narrative:  Patient is a 80 year old male with history of chronic systolic heart failure, severe aortic stenosis status post TAVR, permanent A. fib, myelodysplastic syndrome, chronic anemia, type 2 diabetes, hypertension, stage IIIb CKD with a baseline creatinine of 1.6-2.0 who presented with complaints of bilateral lower extremity swelling.He was also complaining of dyspnea, orthopnea.  He also was noticed to have right knee effusion.  Patient was admitted for the management of acute on chronic congestive heart failure and acute gout.  He underwent arthrocentesis of the knee which showed urate crystals.PT recommending SNF on  discharge  Assessment & Plan:  Acute on chronic systolic congestive heart failure: -Presented with bilateral leg swelling and elevated BNP on admission. -Echocardiogram done  on 12/08/2019 and showing global hypokinesis, mild concentric LVH, LVEF 45 to 50%, and indeterminate diastolic function.He also has a history of severe aortic stenosis status post TAVR in April 2019 as well as permanent atrial fibrillation complicated by sick sinus syndrome status post pacemaker placement in February 2019 andchronically anticoagulated on Eliquis. -Started on Lasix 60 mg IV twice daily-changed to home dose today-80 mg in the morning and 40 mg in the evening on 1/5.  His shortness of breath-got worse.  Repeat chest x-ray shows worsening CHF.  Change p.o. Lasix to IV Lasix. - continue to monitor input and output, daily weight Patient is on 2 L of oxygen at home.  Currently on baseline oxygen requirement  AKI on CKD stage IIIb: Most likely secondary to cardiorenal syndrome.  His baseline creatinine ranges from 1.6-2.  Continue diuresis.  Kidney function slightly improved this morning.  Monitor BMP -Renal ultrasound shows creased echogenicity of renal parenchyma is noted  suggesting medical renal disease. -Consulted nephrology-appreciate help.  Right knee effusion/acute gout flare:   -Underwent arthrocentesis in the emergency department.   -Synovial fluid significant neutrophils suggesting inflammatory arthritis, presence of urate crystals.  He has severely elevated uric acid level.  He has acute gout flare, will not start allopurinol for now.  Colchicine is contraindicated on the background of CKD.  We will continue slow taper of the steroids of prednisone . Given 60 mg of IV Solu-Medrol on 01/27/19. He needs to follow-up with rheumatology as an outpatient.  Chronic macrocytic anemia/thrombocytopenia: History of myelodysplastic syndrome.  Follows with Dr. Donneta Romberg.  He was a started on Lenalidomidewas started on 01/09/2020. He  underwent transfusion of 1 unit PRBC on 01/20/2020. Hemoglobin dropped to 6.6 on 01/27/2020 and he was transfused with unit of PRBC.Hb in the range of 7 -Revlimid is on hold as per oncology's recommendations. -Continue to monitor H&H closely and transfuse as needed.  Hemoptysis: -Repeat chest x-ray shows worsening CHF. -Monitor H&H closely.  Thrombocytopenia-worsening.  On Eliquis due to history of A. Fib-we will defer this to oncology.  Suspicion for hemolytic anemia/tumor lysis syndrome: Elevated uric acid level, bilirubin, phosphorus.   -Elevated total bilirubin of 5.2, direct bilirubin: 2.4 and indirect bilirubin: 2.8. -Oncology Dr. Smith Robert was consulted-appreciate help.  G6PD level is pending.  Type diabetes mellitus: Takes glimepiride at home.  Continue sliding-scale insulin here.  Monitor blood sugars  Permanent A. XBM:[id number] history of suchandcomplicated by sick sinus syndrome status post pacemaker placement in February 2019.  On Eliquis for anticoagulation.We will reduce the dose of eliquis to 2.5 BID . currently rate is controlled.  If platelets drops less than 30,000, will stop Eliquis.  BPH:On   Flomax  Hyperphosphatemia: Most likely history with CKD.  -Phosphorus trended down from 6.1-5.7-4.8 today.  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  PTH: Elevated at 72, calcium: 8.9.   -Continue PhosLo. -Appreciate nephrology's recommendations.  Elevated liver enzymes/elevated bilirubin: Negative hepatitis panel.  He has chronically elevated bilirubin as per previous records.  As per ultrasound of the right upper quadrant on 11/21 he has cirrhosis of the liver.  Continue monitoring.  Hypertension: Blood pressure is stable.  Continue hydralazine 25 mg twice daily.  Continue to monitor blood pressure closely.  Sleepiness/drowsiness: Noted on 01/27/2020.  Most likely secondary to opioids.  Fentanyl discontinued.  Given a dose of Narcan.  Minimize narcotics.   Debility/deconditioning: PT/OT recommended SNF placement.  TOC consulted.  Goals of care: -Patient has overall poor prognosis in the setting of CHF, MDS, worsening renal function.  Consulted palliative care to discuss goals of care with patient's family.  DVT prophylaxis:  Eliquis/SCD  Code Status: Full code Family Communication: Patient's wife present at bedside.  Plan of care discussed with patient in length and he verbalized understanding and agreed with it. Disposition Plan: PT/OT recommended SNF however patient's wife wishes to take patient home.  Consultants:   Nephrology  Oncology  Procedures:   Echo  Antimicrobials:   None  Status is: Inpatient  Dispo: The patient is from: Home              Anticipated d/c is to: Home              Anticipated d/c date is: 1 day              Patient currently is not medically stable to d/c.      Subjective: Patient seen and examined.  Wife at bedside.  Wife tells me that patient had rough last night with worsening shortness of breath and leg pain.  Remained afebrile.  Denies chest pain, palpitation, dizziness or lightheadedness.  Objective: Vitals:    01/30/20 0430 01/30/20 0500 01/30/20 0755 01/30/20 1048  BP: (!) 141/66  134/70 135/66  Pulse: 65  70 67  Resp: 17     Temp: 97.8 F (36.6 C)  98.1 F (36.7 C) 98.2 F (36.8 C)  TempSrc:   Oral   SpO2: 96%  94% 95%  Weight:  110.9 kg    Height:        Intake/Output Summary (Last 24 hours) at 01/30/2020 1501 Last data filed at 01/30/2020 1225 Gross per 24 hour  Intake 120 ml  Output 2650 ml  Net -2530 ml   Filed Weights   01/28/20 0539 01/29/20 0700 01/30/20 0500  Weight: 115 kg 113.9 kg 110.9 kg    Examination:  General exam: Appears calm and comfortable, elderly, on 2 L of oxygen via nasal cannula Respiratory system: Decreased breath sounds noted on the bases.  No wheezing or rhonchi or crackles noted. Cardiovascular system: S1 & S2 heard, RRR. No JVD, murmurs, rubs, gallops or clicks.  2+ pitting edema positive in right leg and 1+ left leg.. Gastrointestinal system: Abdomen is nondistended, soft and nontender. No organomegaly or masses felt. Normal bowel sounds heard. Central nervous system: Alert and oriented. No focal neurological deficits. Extremities: Symmetric 5 x 5 power.  Right knee: Swollen, tender on palpation, no erythema or warmth. Skin: No rashes, lesions or ulcers Psychiatry: Judgement and insight appear normal. Mood & affect appropriate.    Data Reviewed: I have personally reviewed following labs and imaging studies  CBC: Recent Labs  Lab 01/26/20 1300 01/27/20 0718 01/28/20 0525 01/28/20 1455 01/29/20 0533 01/30/20 0654  WBC 7.6 7.8 7.3  --  5.7 4.6  NEUTROABS  --   --  5.3  --  3.6  --   HGB 7.1* 6.6* 7.6* 13.0 7.2* 7.3*  HCT 22.9* 21.1* 23.3*  --  22.6* 22.7*  MCV 104.1* 106.0* 100.4*  --  101.8* 102.7*  PLT 63* 50* 45*  --  43* 44*   Basic Metabolic Panel: Recent Labs  Lab 01/26/20 1300 01/26/20 1400 01/27/20 0718 01/28/20 0525 01/28/20 0810 01/29/20 0533 01/30/20 0654  NA 134*  --  135 137 136 137 139  K 5.1  --  5.1 4.3 4.4 4.1 3.9   CL 94*  --  96* 95* 94* 94* 93*  CO2 26  --  23 27 27 30  34*  GLUCOSE 150*  --  196* 167* 194* 202* 191*  BUN 45*  --  64* 83* 82* 91* 92*  CREATININE 2.35*  --  2.60* 2.41* 2.38* 2.18* 2.00*  CALCIUM 9.0  --  9.0 9.0 8.9  8.9 8.9 8.7*  MG 2.3  --  2.5*  --   --   --  3.0*  PHOS  --  4.7* 5.6* 6.1*  --  5.7* 4.8*   GFR: Estimated Creatinine Clearance: 39.1 mL/min (A) (by C-G formula based on SCr of 2 mg/dL (H)). Liver Function Tests: Recent Labs  Lab 01/27/20 0718 01/28/20 0525 01/28/20 0810 01/28/20 1414 01/29/20 0533 01/30/20 0654  AST 133* 68* 59*  --  42* 40  ALT 112* 128* 122*  --  107* 103*  ALKPHOS 112 102 98  --  94 95  BILITOT 5.5* 5.1* 5.0* 5.2* 4.1* 4.0*  PROT 6.9 6.8 6.7  --  6.6 6.5  ALBUMIN 3.6 3.7 3.6  --  3.4* 3.5   No results for input(s): LIPASE, AMYLASE in the last 168 hours. No results for input(s): AMMONIA in the last 168 hours. Coagulation Profile: No results for input(s): INR, PROTIME in the last 168 hours. Cardiac Enzymes: Recent Labs  Lab 01/29/20 1115  CKTOTAL 93   BNP (last 3 results) Recent Labs    12/06/19 1633  PROBNP 9,423*   HbA1C: No results for input(s): HGBA1C in the last 72 hours. CBG: Recent Labs  Lab 01/29/20 1209 01/29/20 1648 01/29/20 2157 01/30/20 0816 01/30/20 1113  GLUCAP 190* 217* 243* 163* 173*   Lipid Profile: No results for input(s): CHOL, HDL, LDLCALC, TRIG, CHOLHDL, LDLDIRECT in the last 72 hours. Thyroid Function Tests: No results for input(s): TSH, T4TOTAL, FREET4, T3FREE, THYROIDAB in the last 72 hours. Anemia Panel: Recent Labs    01/28/20 1455  VITAMINB12 5,466*  FOLATE 8.4  RETICCTPCT 3.2*   Sepsis Labs: No results for input(s): PROCALCITON, LATICACIDVEN in the last 168 hours.  Recent Results (from the past 240 hour(s))  Body fluid culture     Status: None   Collection Time: 01/26/20  3:03 PM   Specimen: KNEE; Body Fluid  Result Value Ref Range Status   Specimen Description   Final     KNEE Performed at [facility], [address], [place], [place] [address]    Special Requests   Final    NONE Performed at [facility], [address] [place], [place] [address]    Gram Stain   Final    FEW WBC PRESENT, PREDOMINANTLY PMN NO ORGANISMS SEEN    Culture   Final  NO GROWTH Performed at [facility], [place] [address] [place], [place] [address]    Report Status 01/30/2020 FINAL  Final  Resp Panel by RT-PCR (Flu A&B, Covid) Nasopharyngeal Swab     Status: None   Collection Time: 01/26/20  4:01 PM   Specimen: Nasopharyngeal Swab; Nasopharyngeal(NP) swabs in vial transport medium  Result Value Ref Range Status   SARS Coronavirus 2 by RT PCR NEGATIVE NEGATIVE Final    Comment: (NOTE) SARS-CoV-2 target nucleic acids are NOT DETECTED.  The SARS-CoV-2 RNA is generally detectable in upper respiratory specimens during the acute phase of infection. The lowest concentration of SARS-CoV-2 viral copies this assay can detect is 138 copies/mL. A negative result does not preclude SARS-Cov-2 infection and should not be used as the sole basis for treatment or other patient management decisions. A negative result may occur with  improper specimen collection/handling, submission of specimen other than nasopharyngeal swab, presence of viral mutation(s) within the areas targeted by this assay, and inadequate number of viral copies(<138 copies/mL). A negative result must be combined with clinical observations, patient history, and epidemiological information. The expected result is Negative.  Fact Sheet for Patients:  EntrepreneurPulse.com.au  Fact Sheet for Healthcare Providers:  IncredibleEmployment.be  This test is no t yet approved or cleared by the [place] FDA and  has been authorized for detection and/or diagnosis of SARS-CoV-2 by FDA under an Emergency Use Authorization (EUA). This EUA will remain  in  effect (meaning this test can be used) for the duration of the COVID-19 declaration under Section 564(b)(1) of the Act, 21 U.S.C.section 360bbb-3(b)(1), unless the authorization is terminated  or revoked sooner.       Influenza A by PCR NEGATIVE NEGATIVE Final   Influenza B by PCR NEGATIVE NEGATIVE Final    Comment: (NOTE) The Xpert Xpress SARS-CoV-2/FLU/RSV plus assay is intended as an aid in the diagnosis of influenza from Nasopharyngeal swab specimens and should not be used as a sole basis for treatment. Nasal washings and aspirates are unacceptable for Xpert Xpress SARS-CoV-2/FLU/RSV testing.  Fact Sheet for Patients: EntrepreneurPulse.com.au  Fact Sheet for Healthcare Providers: IncredibleEmployment.be  This test is not yet approved or cleared by the [place] FDA and has been authorized for detection and/or diagnosis of SARS-CoV-2 by FDA under an Emergency Use Authorization (EUA). This EUA will remain in effect (meaning this test can be used) for the duration of the COVID-19 declaration under Section 564(b)(1) of the Act, 21 U.S.C. section 360bbb-3(b)(1), unless the authorization is terminated or revoked.  Performed at [facility], [address] [place], [place] [address]       Radiology Studies: DG Chest 1 View  Result Date: 01/30/2020 CLINICAL DATA:  Increasing lower extremity edema EXAM: CHEST  1 VIEW COMPARISON:  01/26/2020 FINDINGS: Cardiac shadow is enlarged. Aortic calcifications are noted. Postsurgical changes are again seen. Pacing device is again noted. Increasing vascular congestion with interstitial edema is noted worsened from the prior exam. Small left pleural effusion is noted. No bony abnormality is seen. IMPRESSION: Increasing CHF when compared with the prior exam Electronically Signed   By: Inez Catalina M.D.   On: 01/30/2020 09:08   US RENAL  Result Date: 01/30/2020 CLINICAL DATA:  Worsening renal  function. EXAM: RENAL / URINARY TRACT ULTRASOUND COMPLETE COMPARISON:  None. FINDINGS: Right Kidney: Renal measurements: 10.9 x 6.7 x 6.3 cm = volume: 242 mL. Increased echogenicity of renal parenchyma is noted suggesting medical renal disease. No mass or hydronephrosis  visualized. Left Kidney: Renal measurements: 10.9 x 6.5 x 5.5 cm = volume: 204 mL. Increased echogenicity of renal parenchyma is noted suggesting medical renal disease. No mass or hydronephrosis visualized. Bladder: Appears normal for degree of bladder distention. Other: None. IMPRESSION: Increased echogenicity of renal parenchyma is noted suggesting medical renal disease. No hydronephrosis or renal obstruction is noted. Electronically Signed   By: Marijo Conception M.D.   On: 01/30/2020 09:01    Scheduled Meds: . apixaban  2.5 mg Oral BID  . calcium acetate  667 mg Oral TID WC  . docusate sodium  100 mg Oral Daily  . furosemide  60 mg Intravenous BID  . hydrALAZINE  25 mg Oral BID  . insulin aspart  0-6 Units Subcutaneous TID WC  . lidocaine  1 patch Transdermal Q24H  . polyethylene glycol  17 g Oral Daily  . predniSONE  50 mg Oral Q breakfast  . tamsulosin  0.4 mg Oral QPC supper  . cyanocobalamin  1,000 mcg Oral Daily   Continuous Infusions:   LOS: 4 days   Time spent: 40 minutes.   Mckinley Jewel, MD [facility]  If 7PM-7AM, please contact night-coverage [email or web address] 01/30/2020, 3:01 PM

## 2020-01-31 ENCOUNTER — Inpatient Hospital Stay: Payer: Medicare Other

## 2020-01-31 DIAGNOSIS — I5023 Acute on chronic systolic (congestive) heart failure: Secondary | ICD-10-CM | POA: Diagnosis not present

## 2020-01-31 DIAGNOSIS — Z515 Encounter for palliative care: Secondary | ICD-10-CM

## 2020-01-31 LAB — URINALYSIS, ROUTINE W REFLEX MICROSCOPIC
Bilirubin Urine: NEGATIVE
Glucose, UA: NEGATIVE mg/dL
Hgb urine dipstick: NEGATIVE
Ketones, ur: NEGATIVE mg/dL
Leukocytes,Ua: NEGATIVE
Nitrite: NEGATIVE
Protein, ur: NEGATIVE mg/dL
Specific Gravity, Urine: 1.009 (ref 1.005–1.030)
pH: 6 (ref 5.0–8.0)

## 2020-01-31 LAB — BASIC METABOLIC PANEL
Anion gap: 13 (ref 5–15)
BUN: 91 mg/dL — ABNORMAL HIGH (ref 8–23)
CO2: 32 mmol/L (ref 22–32)
Calcium: 8.9 mg/dL (ref 8.9–10.3)
Chloride: 95 mmol/L — ABNORMAL LOW (ref 98–111)
Creatinine, Ser: 1.86 mg/dL — ABNORMAL HIGH (ref 0.61–1.24)
GFR, Estimated: 37 mL/min — ABNORMAL LOW (ref >60)
Glucose, Bld: 252 mg/dL — ABNORMAL HIGH (ref 70–99)
Potassium: 3.8 mmol/L (ref 3.5–5.1)
Sodium: 140 mmol/L (ref 135–145)

## 2020-01-31 LAB — CBC
HCT: 23.4 % — ABNORMAL LOW (ref 39.0–52.0)
Hemoglobin: 7.3 g/dL — ABNORMAL LOW (ref 13.0–17.0)
MCH: 32.3 pg (ref 26.0–34.0)
MCHC: 31.2 g/dL (ref 30.0–36.0)
MCV: 103.5 fL — ABNORMAL HIGH (ref 80.0–100.0)
Platelets: 50 10*3/uL — ABNORMAL LOW (ref 150–400)
RBC: 2.26 MIL/uL — ABNORMAL LOW (ref 4.22–5.81)
RDW: 22.7 % — ABNORMAL HIGH (ref 11.5–15.5)
WBC: 3.5 10*3/uL — ABNORMAL LOW (ref 4.0–10.5)
nRBC: 3.2 % — ABNORMAL HIGH (ref 0.0–0.2)

## 2020-01-31 LAB — GLUCOSE, CAPILLARY
Glucose-Capillary: 195 mg/dL — ABNORMAL HIGH (ref 70–99)
Glucose-Capillary: 179 mg/dL — ABNORMAL HIGH (ref 70–99)
Glucose-Capillary: 199 mg/dL — ABNORMAL HIGH (ref 70–99)
Glucose-Capillary: 199 mg/dL — ABNORMAL HIGH (ref 70–99)

## 2020-01-31 LAB — URIC ACID: Uric Acid, Serum: 16.9 mg/dL — ABNORMAL HIGH (ref 3.7–8.6)

## 2020-01-31 LAB — PHOSPHORUS: Phosphorus: 3.9 mg/dL (ref 2.5–4.6)

## 2020-01-31 LAB — PREPARE RBC (CROSSMATCH)

## 2020-01-31 MED ORDER — SODIUM CHLORIDE 0.9% IV SOLUTION: Freq: Once | INTRAVENOUS | Status: AC

## 2020-01-31 MED ORDER — FUROSEMIDE 10 MG/ML IJ SOLN
20.0000 mg | Freq: Once | INTRAMUSCULAR | Status: AC
  Administered 2020-01-31: 20 mg via INTRAVENOUS

## 2020-01-31 MED ORDER — PANTOPRAZOLE SODIUM 40 MG PO TBEC
40.0000 mg | DELAYED_RELEASE_TABLET | Freq: Every day | ORAL | Status: DC
  Administered 2020-02-01 – 2020-02-04 (×4): 40 mg via ORAL
  Filled 2020-01-31 (×4): qty 1

## 2020-01-31 MED ORDER — CHLORHEXIDINE GLUCONATE CLOTH 2 % EX PADS
6.0000 | MEDICATED_PAD | Freq: Every day | CUTANEOUS | Status: DC
  Administered 2020-01-31 – 2020-02-04 (×4): 6 via TOPICAL

## 2020-01-31 MED ORDER — SODIUM CHLORIDE 0.9 % IV SOLN
6.0000 mg | Freq: Once | INTRAVENOUS | Status: AC
  Administered 2020-01-31: 6 mg via INTRAVENOUS
  Filled 2020-01-31: qty 4

## 2020-01-31 MED ORDER — TRAMADOL HCL 50 MG PO TABS
50.0000 mg | ORAL_TABLET | Freq: Four times a day (QID) | ORAL | Status: DC | PRN
  Administered 2020-02-01: 50 mg via ORAL
  Filled 2020-01-31 (×2): qty 1

## 2020-01-31 NOTE — Consult Note (Signed)
[place]  [phone] [phone]   Name: Evan [name] Date: 01/31/2020 MRN: [id number]  DOB: 1940/10/10  Patient Care Team: Leonel Ramsay, MD as PCP - General (Infectious Diseases) [name], [name] M, MD as PCP - Cardiology (Cardiology) Constance Haw, MD as PCP - Electrophysiology (Cardiology) Cammie Sickle, MD as Consulting Physician (Hematology and Oncology)    REASON FOR CONSULTATION: Evan [name] is a 80 y.o. 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  Patient was admitted to hospital 01/26/2020 with CHF exacerbation.  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  Patient is been treated on Revlimid as an outpatient.  Additionally, he is being treated for acute gout, which is limited in his performance status.  Palliative care was consulted help address goals.  SOCIAL HISTORY:     reports that he quit smoking about 26 years ago. His smoking use included cigarettes. He has a 78.00 pack-year smoking history. He has never used smokeless tobacco. He reports that he does not drink alcohol and does not use drugs.  Patient is married and lives at home with his wife of 78 years.  He has 5 biological children and one stepchild.  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  ADVANCE DIRECTIVES:  Does not have  CODE STATUS: Full code  PAST MEDICAL HISTORY: Past Medical History:  Diagnosis Date  . Anemia   . Carotid arterial disease ([place])   . CHF (congestive heart failure) ([place])   . Coronary artery disease   . Hearing loss   . HOH (hard of hearing)   . Hyperlipidemia   . Hypertension   . Nocturia   . Obesity    . Osteoarthritis    "BUE; BLE; right knee" (03/23/2017)  . Peripheral vision loss, bilateral    S/P 07/2015  . Permanent atrial fibrillation ([place])   . 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 Severe aortic stenosis   . Sinus drainage   . Stroke [facility]) 07/2015   "peripheral vision still bad out of left eye" (03/23/2017)  . Torn rotator cuff 2012   Right arm  . Type II diabetes mellitus ([place])   . Urinary frequency     PAST SURGICAL HISTORY:  Past Surgical History:  Procedure Laterality Date  . BACK SURGERY    . CARDIAC CATHETERIZATION  11/29/2005   SEVERE THREE VESSEL OBSTRUCTIVE ATHERSCLEROTIC CAD. ALL GRAFTS WERE PATENT. EF 65%  . CARDIAC CATHETERIZATION  03/23/2017  . CAROTID ENDARTERECTOMY Right 01-29-08   cea  . CARPAL TUNNEL RELEASE Right   . COLONOSCOPY N/A 12/10/2019   Procedure: COLONOSCOPY;  Surgeon: Wilford Corner, MD;  Location: [facility] ENDOSCOPY;  Service: Endoscopy;  Laterality: N/A;  . CORONARY ARTERY BYPASS GRAFT  03/2003   X3. LIMA GRAFT TO THE LAD, SAPHENOUS VEIN GRAFT TO THE PA, AND A LEFT RADIAL GRAFT TO THEOBTUSE MARGINAL VESSEL  . ENDARTERECTOMY Left 05/28/2012   Procedure: ENDARTERECTOMY CAROTID;  Surgeon: Elam Dutch, MD;  Location: [place];  Service: Vascular;  Laterality: Left;  . ESOPHAGOGASTRODUODENOSCOPY N/A 12/10/2019   Procedure: ESOPHAGOGASTRODUODENOSCOPY (EGD);  Surgeon: Wilford Corner, MD;  Location: [place];  Service: Endoscopy;  Laterality: N/A;  . HEMOSTASIS CLIP PLACEMENT  12/10/2019   Procedure:  HEMOSTASIS CLIP PLACEMENT;  Surgeon: Wilford Corner, MD;  Location: [facility] ENDOSCOPY;  Service: Endoscopy;;  . INSERT / REPLACE / REMOVE PACEMAKER  03/23/2017  . [place]; 1984  . MOLE SURGERY     "? face/arms"  . PACEMAKER IMPLANT N/A 03/23/2017   Procedure: PACEMAKER IMPLANT;  Surgeon: Constance Haw, MD;  Location: [place] CV LAB;  Service: Cardiovascular;  Laterality: N/A;  . PATCH ANGIOPLASTY Left  05/28/2012   Procedure: WITH dACRON PATCH ANGIOPLASTY;  Surgeon: Elam Dutch, MD;  Location: [place];  Service: Vascular;  Laterality: Left;  . POLYPECTOMY  12/10/2019   Procedure: POLYPECTOMY;  Surgeon: Wilford Corner, MD;  Location: [facility] ENDOSCOPY;  Service: Endoscopy;;  . RIGHT/LEFT HEART CATH AND CORONARY/GRAFT ANGIOGRAPHY N/A 03/23/2017   Procedure: RIGHT/LEFT HEART CATH AND CORONARY/GRAFT ANGIOGRAPHY;  Surgeon: Sherren Mocha, MD;  Location: [place] CV LAB;  Service: Cardiovascular;  Laterality: N/A;  . TEE WITHOUT CARDIOVERSION N/A 05/09/2017   Procedure: TRANSESOPHAGEAL ECHOCARDIOGRAM (TEE);  Surgeon: Sherren Mocha, MD;  Location: [place];  Service: Open Heart Surgery;  Laterality: N/A;  . TRANSCATHETER AORTIC VALVE REPLACEMENT, TRANSFEMORAL N/A 05/09/2017   Procedure: TRANSCATHETER AORTIC VALVE REPLACEMENT, TRANSFEMORAL using an Edwards 52m Sapien 3 Aortic Valve;  Surgeon: CSherren Mocha MD;  Location[place]  Service: Open Heart Surgery;  Laterality: N/A;    HEMATOLOGY/ONCOLOGY HISTORY:  Oncology History Overview Note  # NOV 2021-LOW GRADE MDS- Macrocytic ANEMIA [since 2016]- no colo; CT scan march 2019-cirrhosis/ no splenomegaly [no alcohol]; NOV 2021- Hospitalization-  [Bone marrow Nov 2021- [place]/Dr.Feng]-The bone marrow is hypercellular for age with dyspoietic changes primarily involving the megakaryocytic cell line with many small and/or hypolobated forms.  Despite limited overall findings, the changes are concerning for a low-grade myelodysplastic syndrome although secondary  changes cannot be entirely excluded; s/p 5 minutes of PRBC transfusion  # DEC 2021- RETACRIT;  # DEC MID- REVLIMID 5 mg/day [CKD-III]  # CAD/CHF [Dr.Jordan]; CABG [2005] PPP/ AVR s/p TAVR on ELiquis [NOv 2021- on hold post dc]; DM;  Cirrhosis; EGD/Colo NOV 2021- [esophageal candidiasis; multiple polyps; Dr.Schooler; [place]]    MDS (myelodysplastic syndrome), low grade ([place]  12/16/2019 Initial Diagnosis    MDS (myelodysplastic syndrome), low grade (HCC)     ALLERGIES:  is allergic to niacin, niacin and related, metformin and related, and statins.  MEDICATIONS:  Current Facility-Administered Medications  Medication Dose Route Frequency Provider Last Rate Last Admin  . acetaminophen (TYLENOL) tablet 650 mg  650 mg Oral Q6H PRN Howerter, Justin B, DO   650 mg at 01/30/20 1222   Or  . acetaminophen (TYLENOL) suppository 650 mg  650 mg Rectal Q6H PRN Howerter, Justin B, DO      . calcium acetate (PHOSLO) capsule 667 mg  667 mg Oral TID WC AShelly Coss MD   667 mg at 01/31/20 0833  . docusate sodium (COLACE) capsule 100 mg  100 mg Oral Daily Pahwani, Rinka R, MD   100 mg at 01/30/20 0949  . furosemide (LASIX) injection 60 mg  60 mg Intravenous BID Pahwani, Rinka R, MD   60 mg at 01/31/20 0833  . hydrALAZINE (APRESOLINE) tablet 25 mg  25 mg Oral BID Howerter, Justin B, DO   25 mg at 01/30/20 2038  . HYDROcodone-acetaminophen (NORCO) 7.5-325 MG per tablet 1 tablet  1 tablet Oral Q6H PRN AShelly Coss MD   1 tablet at 01/29/20 0424  . insulin aspart (novoLOG) injection 0-6 Units  0-6 Units Subcutaneous TID WC  Howerter, Justin B, DO   1 Units at 01/31/20 [phone]  . lidocaine (LIDODERM) 5 % 1 patch  1 patch Transdermal Q24H Shelly Coss, MD   1 patch at 01/30/20 1214  . naloxone (NARCAN) injection 0.4 mg  0.4 mg Intravenous PRN Shelly Coss, MD   0.4 mg at 01/27/20 1903  . polyethylene glycol (MIRALAX / GLYCOLAX) packet 17 g  17 g Oral Daily Pahwani, Rinka R, MD   17 g at 01/30/20 0952  . predniSONE (DELTASONE) tablet 50 mg  50 mg Oral Q breakfast Shelly Coss, MD   50 mg at 01/31/20 0833  . prochlorperazine (COMPAZINE) tablet 10 mg  10 mg Oral Q6H PRN Howerter, Justin B, DO      . tamsulosin (FLOMAX) capsule 0.4 mg  0.4 mg Oral QPC supper Howerter, Justin B, DO   0.4 mg at 01/30/20 1747  . vitamin B-12 (CYANOCOBALAMIN) tablet 1,000 mcg  1,000 mcg Oral Daily Howerter, Justin B, DO   1,000  mcg at 01/30/20 0949    VITAL SIGNS: BP 137/66 (BP Location: Left Arm)   Pulse 66   Temp 98.3 F (36.8 C)   Resp 18   Ht 6' (1.829 m)   Wt 240 lb 4.8 oz (109 kg)   SpO2 97%   BMI 32.59 kg/m  Filed Weights   01/29/20 0700 01/30/20 0500 01/31/20 0500  Weight: 251 lb 3.2 oz (113.9 kg) 244 lb 7.8 oz (110.9 kg) 240 lb 4.8 oz (109 kg)    Estimated body mass index is 32.59 kg/m as calculated from the following:   Height as of this encounter: 6' (1.829 m).   Weight as of this encounter: 240 lb 4.8 oz (109 kg).  LABS: CBC:    Component Value Date/Time   WBC 3.5 (L) 01/31/2020 0456   HGB 7.3 (L) 01/31/2020 0456   HGB 7.6 (L) 01/29/2020 1115   HCT 23.4 (L) 01/31/2020 0456   HCT 19.6 (L) 12/06/2019 1630   PLT 50 (L) 01/31/2020 0456   PLT 287 12/06/2019 1630   MCV 103.5 (H) 01/31/2020 0456   MCV 107 (H) 12/06/2019 1630   NEUTROABS 3.6 01/29/2020 0533   NEUTROABS 3.9 03/02/2018 1205   LYMPHSABS 0.3 (L) 01/29/2020 0533   LYMPHSABS 1.0 03/02/2018 1205   MONOABS 0.4 01/29/2020 0533   EOSABS 0.6 (H) 01/29/2020 0533   EOSABS 0.2 03/02/2018 1205   BASOSABS 0.0 01/29/2020 0533   BASOSABS 0.0 03/02/2018 1205   Comprehensive Metabolic Panel:    Component Value Date/Time   NA 140 01/31/2020 0456   NA 141 12/06/2019 1630   K 3.8 01/31/2020 0456   CL 95 (L) 01/31/2020 0456   CO2 32 01/31/2020 0456   BUN 91 (H) 01/31/2020 0456   BUN 54 (H) 12/06/2019 1630   CREATININE 1.86 (H) 01/31/2020 0456   CREATININE 0.96 12/11/2015 0854   GLUCOSE 252 (H) 01/31/2020 0456   CALCIUM 8.9 01/31/2020 0456   CALCIUM 8.9 01/28/2020 0810   AST 40 01/30/2020 0654   ALT 103 (H) 01/30/2020 0654   ALKPHOS 95 01/30/2020 0654   BILITOT 4.0 (H) 01/30/2020 0654   BILITOT 0.7 08/06/2018 1159   PROT 6.5 01/30/2020 0654   PROT 7.0 08/06/2018 1159   ALBUMIN 3.5 01/30/2020 0654   ALBUMIN 4.6 08/06/2018 1159    RADIOGRAPHIC STUDIES: DG Chest 1 View  Result Date: 01/30/2020 CLINICAL DATA:  Increasing  lower extremity edema EXAM: CHEST  1 VIEW COMPARISON:  01/26/2020 FINDINGS: Cardiac shadow is enlarged. Aortic  calcifications are noted. Postsurgical changes are again seen. Pacing device is again noted. Increasing vascular congestion with interstitial edema is noted worsened from the prior exam. Small left pleural effusion is noted. No bony abnormality is seen. IMPRESSION: Increasing CHF when compared with the prior exam Electronically Signed   By: Inez Catalina M.D.   On: 01/30/2020 09:08   DG Chest 2 View  Result Date: 01/26/2020 CLINICAL DATA:  Shortness of breath EXAM: CHEST - 2 VIEW COMPARISON:  December 07, 2019 FINDINGS: The mediastinal contour and cardiac silhouette are stable. Cardiac pacemaker is unchanged. The heart size is enlarged. Diffuse increased pulmonary interstitium is identified bilaterally. There is no pleural effusion or focal pneumonia. The bony structures are stable. IMPRESSION: Congestive heart failure. Electronically Signed   By: Abelardo Diesel M.D.   On: 01/26/2020 15:51   DG Knee 2 Views Right  Result Date: 01/26/2020 CLINICAL DATA:  Bilateral leg weakness EXAM: RIGHT KNEE - 1-2 VIEW COMPARISON:  None. FINDINGS: Degenerative changes with joint space narrowing and spurring most pronounced in the patellofemoral compartment. Small joint effusion. No acute bony abnormality. Specifically, no fracture, subluxation, or dislocation. Dense vascular calcifications. IMPRESSION: Degenerative changes as above. Small joint effusion. No acute bony abnormality. Electronically Signed   By: Rolm Baptise M.D.   On: 01/26/2020 14:02   US RENAL  Result Date: 01/30/2020 CLINICAL DATA:  Worsening renal function. EXAM: RENAL / URINARY TRACT ULTRASOUND COMPLETE COMPARISON:  None. FINDINGS: Right Kidney: Renal measurements: 10.9 x 6.7 x 6.3 cm = volume: 242 mL. Increased echogenicity of renal parenchyma is noted suggesting medical renal disease. No mass or hydronephrosis visualized. Left Kidney: Renal  measurements: 10.9 x 6.5 x 5.5 cm = volume: 204 mL. Increased echogenicity of renal parenchyma is noted suggesting medical renal disease. No mass or hydronephrosis visualized. Bladder: Appears normal for degree of bladder distention. Other: None. IMPRESSION: Increased echogenicity of renal parenchyma is noted suggesting medical renal disease. No hydronephrosis or renal obstruction is noted. Electronically Signed   By: Marijo Conception M.D.   On: 01/30/2020 09:01    PERFORMANCE STATUS (ECOG) : 3 - Symptomatic, >50% confined to bed  Review of Systems Unless otherwise noted, a complete review of systems is negative.  Physical Exam General: NAD, frail appearing Pulmonary: Exertion unlabored Abdomen: soft, nontender, + bowel sounds GU: no suprapubic tenderness Extremities: no edema, no joint deformities Skin: no rashes Neurological: Weakness but otherwise nonfocal  IMPRESSION: Met with patient and wife.  Together we discussed patient's ongoing medical problems.  Wife seems to recognize that patient's prognosis is poor.  However, she remains hopeful that he will improve with ongoing medical treatment.  She asked about inpatient rehab at [facility] in an attempt to facilitate improvement in his performance status.  However, PT has recommended SNF.  I'm unsure that patient clinically would be able to tolerate intensive physical therapy.  Wife did ask about taking patient home with hospice, which we discussed in detail.  Unfortunately, patient would have to forego future transfusions to pursue hospice care.  I do not think that patient wife would opt to forego transfusions at the present time.  In fact, patient is pending transfusion today.  We discussed option of follow-up in the [place] after discharge from the hospital to further discuss goals.  Lengthy conversation with patient and wife about CODE STATUS.  He does not have any advance directives and patient and family have never discussed end-of-life  care.  Wife verbalized an understanding that aggressive  measures at end-of-life would likely ultimately prove futile.  However, when asked, patient stated that he wanted to remain a full code for now.  Wife says that she will talk to him more in-depth to facilitate his understanding.  PLAN: -Continue current scope of treatment -Full code for now.  Patient wants to discuss further -Will follow   Time Total: 60 minutes  Visit consisted of counseling and education dealing with the complex and emotionally intense issues of symptom management and palliative care in the setting of serious and potentially life-threatening illness.Greater than 50%  of this time was spent counseling and coordinating care related to the above assessment and plan.  Signed by: Altha Harm, PhD, NP-C

## 2020-01-31 NOTE — Telephone Encounter (Signed)
Spoke to patient's wife she stated patient is still in hospital in [place]. Hospice has been recommended.Stated Dr.discussed DNR.Stated they do not want a DNR right now.She wanted to know if she takes husband home can he be transferred to a rehab facility if she cannot take care of him.Advised to discuss with a Education officer, museum.

## 2020-01-31 NOTE — TOC Progression Note (Signed)
Transition of Care [facility]) - Progression Note    Patient Details  Name: Evan [name] MRN: [id number] Date of Birth: 1940-04-27  Transition of Care [facility] CM/SW Contact  Shelbie Hutching, RN Phone Number: 01/31/2020, 4:05 PM  Clinical Narrative:    RNCM spoke with patient's wife via phone.  Wife wants to get patient home and is happy about having home health services. Wife also reports that the patient needs a hospital bed and wheelchair at home.  Orders for DME are placed and Mardene Celeste with [employer] will be reaching out to the patient's wife to work on delivery.  Wife agrees to OP Palliative services at discharge.  Kieth Brightly with [facility] given referral.    Expected Discharge Plan: [place] Barriers to Discharge: Continued Medical Work up  Expected Discharge Plan and Services Expected Discharge Plan: [place] arrangements for the past 2 months: [place]                 DME Arranged: Hospital bed,Lightweight manual wheelchair with seat cushion DME Agency: [employer] Date DME Agency Contacted: 01/31/20 Time DME Agency Contacted: [id number] Representative spoke with at DME Agency: [place]: [place]: [place] ([place]) Date [place]: 01/31/20 Time [place]: [place] Representative spoke with at [place]: [place] ([place]) Interventions    Readmission Risk Interventions Readmission Risk Prevention Plan 01/27/2020  Transportation Screening Complete  PCP or Specialist Appt within 3-5 Days Complete  HRI or [place] Complete  Social Work Consult for [place] Planning/Counseling Complete  Palliative Care Screening Not Applicable  Medication Review Press photographer) Complete  Some recent data might be hidden

## 2020-01-31 NOTE — Telephone Encounter (Signed)
Patient's wife is calling to follow up regarding hospitalization. She is requesting to speak with Dr. Doug Sou nurse to discuss the possibility of having the patient transferred to [facility]. Please return call to discuss further

## 2020-01-31 NOTE — Progress Notes (Signed)
PROGRESS NOTE    MONIQUE HOEG  [id number] DOB: 1940/06/18 DOA: 01/26/2020 PCP: Leonel Ramsay, MD   Brief Narrative:  Patient is a 80 year old male with history of chronic systolic heart failure, severe aortic stenosis status post TAVR, permanent A. fib, myelodysplastic syndrome, chronic anemia, type 2 diabetes, hypertension, stage IIIb CKD with a baseline creatinine of 1.6-2.0 who presented with complaints of bilateral lower extremity swelling.He was also complaining of dyspnea, orthopnea.  He also was noticed to have right knee effusion.  Patient was admitted for the management of acute on chronic congestive heart failure and acute gout.  He underwent arthrocentesis of the knee which showed urate crystals.PT recommending SNF on  discharge however wife wishes to take him home instead.  Assessment & Plan:  Acute on chronic systolic congestive heart failure: -Presented with bilateral leg swelling and elevated BNP on admission. -Echocardiogram done  on 12/08/2019 and showing global hypokinesis, mild concentric LVH, LVEF 45 to 50%, and indeterminate diastolic function. -Started on Lasix 60 mg IV twice daily-changed to home dose today-80 mg in the morning and 40 mg in the evening on 1/5.  His shortness of breath-got worse.  Repeat chest x-ray shows worsening CHF.  Change p.o. Lasix to IV Lasix-cont. Same for now - continue to monitor input and output, daily weight -Currently on 5 L of oxygen via nasal cannula.  Baseline is 2 L at home.  We will try to wean off of oxygen as tolerated.  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  His baseline creatinine ranges from 1.6-2.  Continue diuresis.  Kidney function slightly improved this morning.  Monitor BMP -Renal ultrasound shows creased echogenicity of renal parenchyma is noted suggesting medical renal disease. -Consulted nephrology-appreciate help.  Right knee effusion/acute gout flare/hyperuricemia:    -Underwent arthrocentesis in the emergency department.   -Synovial fluid significant neutrophils suggesting inflammatory arthritis, presence of urate crystals.  He has severely elevated uric acid level.  - Colchicine is contraindicated on the background of CKD.  We will continue slow taper of the steroids of prednisone . Given 60 mg of IV Solu-Medrol on 01/27/19. He needs to follow-up with rheumatology as an outpatient. -Repeat x-ray of right knee shows no change in small effusion.  Mild to moderate arthritis and chondrocalcinosis. -Add tramadol as needed for pain control  Chronic macrocytic anemia/thrombocytopenia: History of myelodysplastic syndrome.  Follows with Dr. Rogue Bussing.  He was a started on Lenalidomidewas started on 01/09/2020. He  underwent transfusion of 1 unit PRBC on 01/20/2020. Hemoglobin dropped to 6.6 on 01/27/2020 and he was transfused with unit of PRBC.Hb in the range of 7-will transfuse him 1 more unit with Lasix in between. -Revlimid is on hold as per oncology's recommendations.  Hemoptysis: -Repeat chest x-ray shows worsening CHF. -Monitor H&H closely.  Thrombocytopenia-worsening.   -Discussed with oncology-discontinue Eliquis on 01/30/2020  ?hemolytic anemia/tumor lysis syndrome: Elevated uric acid level, bilirubin, phosphorus.   -Elevated total bilirubin of 5.2, direct bilirubin: 2.4 and indirect bilirubin: 2.8. -Oncology Dr. Janese Banks was consulted-appreciate help-does not think it is tumor lysis as low-grade MDS is unlikely to cause significant degree of TLS. -G6PD level: WNL.    Type diabetes mellitus: Takes glimepiride at home.  Continue sliding-scale insulin here.  Monitor blood sugars  Permanent A. [id number] history of suchandcomplicated by sick sinus syndrome status post pacemaker placement in February 2019.  His Eliquis dose was reduced to 2.5 mg twice daily based on his renal function.  Eliquis discontinued on  1/6-in the setting of worsening thrombocytopenia  and hemoptysis.   BPH:On  Flomax  Hyperphosphatemia: Most likely history with CKD.  -Phosphorus trended down from 6.1-5.7-4.8-3.9 today.  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  PTH: Elevated at 72, calcium: 8.9.   -Continue PhosLo. -Appreciate nephrology's recommendations.  Elevated liver enzymes/elevated bilirubin: Negative hepatitis panel.  He has chronically elevated bilirubin as per previous records.  As per ultrasound of the right upper quadrant on 11/21 he has cirrhosis of the liver.  Continue monitoring.  Hypertension: Blood pressure is stable.  Continue hydralazine 25 mg twice daily.  Continue to monitor blood pressure closely.  Debility/deconditioning: PT/OT recommended SNF placement however patient's wife wishes to take him home.  Goals of care: -Patient has overall poor prognosis in the setting of CHF, MDS, worsening renal function.  Discussed with patient's wife at the bedside. appreciate palliative care assistance.  DVT prophylaxis:  Eliquis/SCD  Code Status: Full code-confirmed with patient's wife Family Communication: Patient's wife present at bedside.  Plan of care discussed with patient in length and he verbalized understanding and agreed with it. Disposition Plan: PT/OT recommended SNF however patient's wife wishes to take patient home.  Consultants:   Nephrology  Oncology  Palliative care  Procedures:   Echo  Antimicrobials:   None  Status is: Inpatient  Dispo: The patient is from: Home              Anticipated d/c is to: Home              Anticipated d/c date is: 2 to 3 days              Patient currently is not medically stable to d/c.      Subjective: Patient seen and examined.  Lying comfortably on the bed.  On 5 L of oxygen via nasal cannula.  Wife at bedside.  Tells me that he slept well last night.  No acute events overnight.  Continues to have right knee pain.  Requested for blood transfusion as she thinks patient  feels better after blood transfusion.  Objective: Vitals:   01/31/20 0040 01/31/20 0500 01/31/20 0627 01/31/20 0746  BP: (!) 142/59  (!) 139/56 137/66  Pulse: 66  73 66  Resp: 18  18   Temp: 98.4 F (36.9 C)  97.9 F (36.6 C) 98.3 F (36.8 C)  TempSrc:      SpO2: 99%  97% 97%  Weight:  109 kg    Height:        Intake/Output Summary (Last 24 hours) at 01/31/2020 1104 Last data filed at 01/31/2020 0500 Gross per 24 hour  Intake 240 ml  Output 2450 ml  Net -2210 ml   Filed Weights   01/29/20 0700 01/30/20 0500 01/31/20 0500  Weight: 113.9 kg 110.9 kg 109 kg    Examination:  General exam: Appears calm and comfortable, elderly, on 5 L of oxygen via nasal cannula, appears sick and weak Respiratory system: Decreased breath sounds noted on the bases.  No wheezing or rhonchi or crackles noted. Cardiovascular system: S1 & S2 heard, RRR. No JVD, murmurs, rubs, gallops or clicks.  Bilateral 1+ pitting edema positive. Gastrointestinal system: Abdomen is nondistended, soft and nontender. No organomegaly or masses felt. Normal bowel sounds heard. Central nervous system: Alert and oriented. No focal neurological deficits. Extremities: Symmetric 5 x 5 power.  Right knee: Swollen, mild tender on palpation, no erythema or warmth. Skin: No rashes, lesions or ulcers Psychiatry: Judgement and insight  appear normal. Mood & affect appropriate.    Data Reviewed: I have personally reviewed following labs and imaging studies  CBC: Recent Labs  Lab 01/27/20 0718 01/28/20 0525 01/28/20 1455 01/29/20 0533 01/29/20 1115 01/30/20 0654 01/31/20 0456  WBC 7.8 7.3  --  5.7  --  4.6 3.5*  NEUTROABS  --  5.3  --  3.6  --   --   --   HGB 6.6* 7.6* 13.0 7.2* 7.6* 7.3* 7.3*  HCT 21.1* 23.3*  --  22.6*  --  22.7* 23.4*  MCV 106.0* 100.4*  --  101.8*  --  102.7* 103.5*  PLT 50* 45*  --  43*  --  44* 50*   Basic Metabolic Panel: Recent Labs  Lab 01/26/20 1300 01/26/20 1400 01/27/20 0718  01/28/20 0525 01/28/20 0810 01/29/20 0533 01/30/20 0654 01/31/20 0456  NA 134*  --  135 137 136 137 139 140  K 5.1  --  5.1 4.3 4.4 4.1 3.9 3.8  CL 94*  --  96* 95* 94* 94* 93* 95*  CO2 26  --  23 27 27 30  34* 32  GLUCOSE 150*  --  196* 167* 194* 202* 191* 252*  BUN 45*  --  64* 83* 82* 91* 92* 91*  CREATININE 2.35*  --  2.60* 2.41* 2.38* 2.18* 2.00* 1.86*  CALCIUM 9.0  --  9.0 9.0 8.9  8.9 8.9 8.7* 8.9  MG 2.3  --  2.5*  --   --   --  3.0*  --   PHOS  --    < > 5.6* 6.1*  --  5.7* 4.8* 3.9   < > = values in this interval not displayed.   GFR: Estimated Creatinine Clearance: 41.8 mL/min (A) (by C-G formula based on SCr of 1.86 mg/dL (H)). Liver Function Tests: Recent Labs  Lab 01/27/20 0718 01/28/20 0525 01/28/20 0810 01/28/20 1414 01/29/20 0533 01/30/20 0654  AST 133* 68* 59*  --  42* 40  ALT 112* 128* 122*  --  107* 103*  ALKPHOS 112 102 98  --  94 95  BILITOT 5.5* 5.1* 5.0* 5.2* 4.1* 4.0*  PROT 6.9 6.8 6.7  --  6.6 6.5  ALBUMIN 3.6 3.7 3.6  --  3.4* 3.5   No results for input(s): LIPASE, AMYLASE in the last 168 hours. No results for input(s): AMMONIA in the last 168 hours. Coagulation Profile: No results for input(s): INR, PROTIME in the last 168 hours. Cardiac Enzymes: Recent Labs  Lab 01/29/20 1115  CKTOTAL 93   BNP (last 3 results) Recent Labs    12/06/19 1633  PROBNP 9,423*   HbA1C: No results for input(s): HGBA1C in the last 72 hours. CBG: Recent Labs  Lab 01/30/20 0816 01/30/20 1113 01/30/20 1616 01/30/20 2043 01/31/20 0745  GLUCAP 163* 173* 245* 237* 195*   Lipid Profile: No results for input(s): CHOL, HDL, LDLCALC, TRIG, CHOLHDL, LDLDIRECT in the last 72 hours. Thyroid Function Tests: No results for input(s): TSH, T4TOTAL, FREET4, T3FREE, THYROIDAB in the last 72 hours. Anemia Panel: Recent Labs    01/28/20 1455  VITAMINB12 5,466*  FOLATE 8.4  RETICCTPCT 3.2*   Sepsis Labs: No results for input(s): PROCALCITON, LATICACIDVEN in  the last 168 hours.  Recent Results (from the past 240 hour(s))  Body fluid culture     Status: None   Collection Time: 01/26/20  3:03 PM   Specimen: KNEE; Body Fluid  Result Value Ref Range Status   Specimen Description  Final    KNEE Performed at [facility], [address], [place], [place] [address]    Special Requests   Final    NONE Performed at [facility], [address] [place], [place] [address]    Gram Stain   Final    FEW WBC PRESENT, PREDOMINANTLY PMN NO ORGANISMS SEEN    Culture   Final    NO GROWTH Performed at [facility], [place] [address], [place], [place] [address]    Report Status 01/30/2020 FINAL  Final  Resp Panel by RT-PCR (Flu A&B, Covid) Nasopharyngeal Swab     Status: None   Collection Time: 01/26/20  4:01 PM   Specimen: Nasopharyngeal Swab; Nasopharyngeal(NP) swabs in vial transport medium  Result Value Ref Range Status   SARS Coronavirus 2 by RT PCR NEGATIVE NEGATIVE Final    Comment: (NOTE) SARS-CoV-2 target nucleic acids are NOT DETECTED.  The SARS-CoV-2 RNA is generally detectable in upper respiratory specimens during the acute phase of infection. The lowest concentration of SARS-CoV-2 viral copies this assay can detect is 138 copies/mL. A negative result does not preclude SARS-Cov-2 infection and should not be used as the sole basis for treatment or other patient management decisions. A negative result may occur with  improper specimen collection/handling, submission of specimen other than nasopharyngeal swab, presence of viral mutation(s) within the areas targeted by this assay, and inadequate number of viral copies(<138 copies/mL). A negative result must be combined with clinical observations, patient history, and epidemiological information. The expected result is Negative.  Fact Sheet for Patients:  EntrepreneurPulse.com.au  Fact Sheet for Healthcare Providers:   IncredibleEmployment.be  This test is no t yet approved or cleared by the [place] FDA and  has been authorized for detection and/or diagnosis of SARS-CoV-2 by FDA under an Emergency Use Authorization (EUA). This EUA will remain  in effect (meaning this test can be used) for the duration of the COVID-19 declaration under Section 564(b)(1) of the Act, 21 U.S.C.section 360bbb-3(b)(1), unless the authorization is terminated  or revoked sooner.       Influenza A by PCR NEGATIVE NEGATIVE Final   Influenza B by PCR NEGATIVE NEGATIVE Final    Comment: (NOTE) The Xpert Xpress SARS-CoV-2/FLU/RSV plus assay is intended as an aid in the diagnosis of influenza from Nasopharyngeal swab specimens and should not be used as a sole basis for treatment. Nasal washings and aspirates are unacceptable for Xpert Xpress SARS-CoV-2/FLU/RSV testing.  Fact Sheet for Patients: EntrepreneurPulse.com.au  Fact Sheet for Healthcare Providers: IncredibleEmployment.be  This test is not yet approved or cleared by the [place] FDA and has been authorized for detection and/or diagnosis of SARS-CoV-2 by FDA under an Emergency Use Authorization (EUA). This EUA will remain in effect (meaning this test can be used) for the duration of the COVID-19 declaration under Section 564(b)(1) of the Act, 21 U.S.C. section 360bbb-3(b)(1), unless the authorization is terminated or revoked.  Performed at [facility], [address], [place], [place] [address]       Radiology Studies: DG Chest 1 View  Result Date: 01/30/2020 CLINICAL DATA:  Increasing lower extremity edema EXAM: CHEST  1 VIEW COMPARISON:  01/26/2020 FINDINGS: Cardiac shadow is enlarged. Aortic calcifications are noted. Postsurgical changes are again seen. Pacing device is again noted. Increasing vascular congestion with interstitial edema is noted worsened from the prior exam. Small  left pleural effusion is noted. No bony abnormality is seen. IMPRESSION: Increasing CHF when compared with the  prior exam Electronically Signed   By: Inez Catalina M.D.   On: 01/30/2020 09:08   DG Knee 1-2 Views Right  Result Date: 01/31/2020 CLINICAL DATA:  Right knee pain and swelling. EXAM: RIGHT KNEE - 1-2 VIEW COMPARISON:  Plain films right knee 01/26/2020. FINDINGS: Small joint effusion is unchanged. There is no acute bony or joint abnormality. Joint spaces are preserved but there is osteophytosis about the which is most notable in the patellofemoral and lateral compartments. Chondrocalcinosis is again seen. Vascular clips the posterior soft tissues medially again noted. Atherosclerosis. IMPRESSION: No acute abnormality. No change in a small knee joint effusion. Mild-to-moderate osteoarthritis. Chondrocalcinosis. Electronically Signed   By: Inge Rise M.D.   On: 01/31/2020 10:44   US RENAL  Result Date: 01/30/2020 CLINICAL DATA:  Worsening renal function. EXAM: RENAL / URINARY TRACT ULTRASOUND COMPLETE COMPARISON:  None. FINDINGS: Right Kidney: Renal measurements: 10.9 x 6.7 x 6.3 cm = volume: 242 mL. Increased echogenicity of renal parenchyma is noted suggesting medical renal disease. No mass or hydronephrosis visualized. Left Kidney: Renal measurements: 10.9 x 6.5 x 5.5 cm = volume: 204 mL. Increased echogenicity of renal parenchyma is noted suggesting medical renal disease. No mass or hydronephrosis visualized. Bladder: Appears normal for degree of bladder distention. Other: None. IMPRESSION: Increased echogenicity of renal parenchyma is noted suggesting medical renal disease. No hydronephrosis or renal obstruction is noted. Electronically Signed   By: Marijo Conception M.D.   On: 01/30/2020 09:01    Scheduled Meds: . sodium chloride   Intravenous Once  . calcium acetate  667 mg Oral TID WC  . docusate sodium  100 mg Oral Daily  . furosemide  20 mg Intravenous Once  . furosemide  60 mg  Intravenous BID  . hydrALAZINE  25 mg Oral BID  . insulin aspart  0-6 Units Subcutaneous TID WC  . lidocaine  1 patch Transdermal Q24H  . polyethylene glycol  17 g Oral Daily  . predniSONE  50 mg Oral Q breakfast  . tamsulosin  0.4 mg Oral QPC supper  . cyanocobalamin  1,000 mcg Oral Daily   Continuous Infusions:   LOS: 5 days   Time spent: 40 minutes.   Mckinley Jewel, MD [facility]  If 7PM-7AM, please contact night-coverage [email or web address] 01/31/2020, 11:04 AM

## 2020-01-31 NOTE — Progress Notes (Signed)
[place] [facility]) Hospital Liaison RN note:  Received new referral for [facility] out patient palliative to follow psot discharge from [place], TOC. Patient information given to referral. Plan is for discharge home this weekend. We will follow for disposition.   Thank you for this referral.  Loney Laurence [facility] Liaison [phone]

## 2020-01-31 NOTE — Progress Notes (Addendum)
   01/31/20 1200  Clinical Encounter Type  Visited With Patient and family together  Visit Type Initial  Referral From Nurse  Consult/Referral To Chaplain  While charting a nurse said Pt and family might benefit from a visit. Chaplain stopped by to see how they were doing. Pt's wife said she wants to take Pt home without hospice. Chaplain mentioned the assistance hospice can offer and Pt's wife said she is not there yet. Chaplain told her she respects that. Chaplain suggested that Pt's wife talk to him about what he wants and she said "You want to be with me as long as possible, right" and Pt said yes. Chaplain told Pt's wife that it is important to make sure Pt is saying what he feels and wife said "not what I want." Chaplain said, "yes". Pt's granddaughter, who is a Marine scientist at [employer] came in and chaplain explained a little of what they were talking about. Chaplain suggested that the family has a conversation about what is best for Pt. Chaplain asked if she could pray and Pt's wife said yes. Chaplain hugged Pt's wife and prayed. Chaplain will follow up later. Family would like for their pastor to come and see Pt. Chaplain told them if they have trouble getting him up to see Pt to have a chaplain paged.

## 2020-01-31 NOTE — Progress Notes (Signed)
[facility]  ROUNDING NOTE   Subjective:   UOP 2445mL Creatinine 1.86 (2) (2.18)  Wife at bedside.   Objective:  Vital signs in last 24 hours:  Temp:  [97.8 F (36.6 C)-98.7 F (37.1 C)] 98.7 F (37.1 C) (01/07 1143) Pulse Rate:  [63-73] 63 (01/07 1143) Resp:  [16-18] 18 (01/07 0627) BP: (137-148)/(56-75) 140/64 (01/07 1143) SpO2:  [92 %-100 %] 100 % (01/07 1143) Weight:  [109 kg] 109 kg (01/07 0500)  Weight change: -1.9 kg Filed Weights   01/29/20 0700 01/30/20 0500 01/31/20 0500  Weight: 113.9 kg 110.9 kg 109 kg    Intake/Output: I/O last 3 completed shifts: In: 240 [P.O.:240] Out: 3550 [Urine:3550]   Intake/Output this shift:  Total I/O In: -  Out: 650 [Urine:650]  Physical Exam: General: NAD,   Head: Normocephalic, atraumatic. Moist oral mucosal membranes  Eyes: Anicteric, PERRL  Neck: Supple, trachea midline  Lungs:  Clear to auscultation  Heart: Regular rate and rhythm  Abdomen:  Soft, nontender,   Extremities:  trace peripheral edema.  Neurologic: Nonfocal, moving all four extremities  Skin: No lesions   Joints Right knee with effusion, nontender    Basic Metabolic Panel: Recent Labs  Lab 01/26/20 1300 01/26/20 1400 01/27/20 0718 01/28/20 0525 01/28/20 0810 01/29/20 0533 01/30/20 0654 01/31/20 0456  NA 134*  --  135 137 136 137 139 140  K 5.1  --  5.1 4.3 4.4 4.1 3.9 3.8  CL 94*  --  96* 95* 94* 94* 93* 95*  CO2 26  --  23 27 27 30  34* 32  GLUCOSE 150*  --  196* 167* 194* 202* 191* 252*  BUN 45*  --  64* 83* 82* 91* 92* 91*  CREATININE 2.35*  --  2.60* 2.41* 2.38* 2.18* 2.00* 1.86*  CALCIUM 9.0  --  9.0 9.0 8.9  8.9 8.9 8.7* 8.9  MG 2.3  --  2.5*  --   --   --  3.0*  --   PHOS  --    < > 5.6* 6.1*  --  5.7* 4.8* 3.9   < > = values in this interval not displayed.    Liver Function Tests: Recent Labs  Lab 01/27/20 0718 01/28/20 0525 01/28/20 0810 01/28/20 1414 01/29/20 0533 01/30/20 0654  AST 133* 68* 59*  --   42* 40  ALT 112* 128* 122*  --  107* 103*  ALKPHOS 112 102 98  --  94 95  BILITOT 5.5* 5.1* 5.0* 5.2* 4.1* 4.0*  PROT 6.9 6.8 6.7  --  6.6 6.5  ALBUMIN 3.6 3.7 3.6  --  3.4* 3.5   No results for input(s): LIPASE, AMYLASE in the last 168 hours. No results for input(s): AMMONIA in the last 168 hours.  CBC: Recent Labs  Lab 01/27/20 0718 01/28/20 0525 01/28/20 1455 01/29/20 0533 01/29/20 1115 01/30/20 0654 01/31/20 0456  WBC 7.8 7.3  --  5.7  --  4.6 3.5*  NEUTROABS  --  5.3  --  3.6  --   --   --   HGB 6.6* 7.6* 13.0 7.2* 7.6* 7.3* 7.3*  HCT 21.1* 23.3*  --  22.6*  --  22.7* 23.4*  MCV 106.0* 100.4*  --  101.8*  --  102.7* 103.5*  PLT 50* 45*  --  43*  --  44* 50*    Cardiac Enzymes: Recent Labs  Lab 01/29/20 1115  CKTOTAL 93    BNP: Invalid input(s): POCBNP  CBG: Recent  Labs  Lab 01/30/20 1113 01/30/20 1616 01/30/20 2043 01/31/20 0745 01/31/20 1145  GLUCAP 173* 245* 237* 195* 179*    Microbiology: Results for orders placed or performed during the hospital encounter of 01/26/20  Body fluid culture     Status: None   Collection Time: 01/26/20  3:03 PM   Specimen: KNEE; Body Fluid  Result Value Ref Range Status   Specimen Description   Final    KNEE Performed at [facility], [address] [place], [place] [address]    Special Requests   Final    NONE Performed at [facility], [address] [place], [place] [address]    Gram Stain   Final    FEW WBC PRESENT, PREDOMINANTLY PMN NO ORGANISMS SEEN    Culture   Final    NO GROWTH Performed at [facility], [place] [address] [place], [place] [address]    Report Status 01/30/2020 FINAL  Final  Resp Panel by RT-PCR (Flu A&B, Covid) Nasopharyngeal Swab     Status: None   Collection Time: 01/26/20  4:01 PM   Specimen: Nasopharyngeal Swab; Nasopharyngeal(NP) swabs in vial transport medium  Result Value Ref Range Status   SARS Coronavirus 2 by RT PCR NEGATIVE NEGATIVE Final     Comment: (NOTE) SARS-CoV-2 target nucleic acids are NOT DETECTED.  The SARS-CoV-2 RNA is generally detectable in upper respiratory specimens during the acute phase of infection. The lowest concentration of SARS-CoV-2 viral copies this assay can detect is 138 copies/mL. A negative result does not preclude SARS-Cov-2 infection and should not be used as the sole basis for treatment or other patient management decisions. A negative result may occur with  improper specimen collection/handling, submission of specimen other than nasopharyngeal swab, presence of viral mutation(s) within the areas targeted by this assay, and inadequate number of viral copies(<138 copies/mL). A negative result must be combined with clinical observations, patient history, and epidemiological information. The expected result is Negative.  Fact Sheet for Patients:  EntrepreneurPulse.com.au  Fact Sheet for Healthcare Providers:  IncredibleEmployment.be  This test is no t yet approved or cleared by the [place] FDA and  has been authorized for detection and/or diagnosis of SARS-CoV-2 by FDA under an Emergency Use Authorization (EUA). This EUA will remain  in effect (meaning this test can be used) for the duration of the COVID-19 declaration under Section 564(b)(1) of the Act, 21 U.S.C.section 360bbb-3(b)(1), unless the authorization is terminated  or revoked sooner.       Influenza A by PCR NEGATIVE NEGATIVE Final   Influenza B by PCR NEGATIVE NEGATIVE Final    Comment: (NOTE) The Xpert Xpress SARS-CoV-2/FLU/RSV plus assay is intended as an aid in the diagnosis of influenza from Nasopharyngeal swab specimens and should not be used as a sole basis for treatment. Nasal washings and aspirates are unacceptable for Xpert Xpress SARS-CoV-2/FLU/RSV testing.  Fact Sheet for Patients: EntrepreneurPulse.com.au  Fact Sheet for Healthcare  Providers: IncredibleEmployment.be  This test is not yet approved or cleared by the [place] FDA and has been authorized for detection and/or diagnosis of SARS-CoV-2 by FDA under an Emergency Use Authorization (EUA). This EUA will remain in effect (meaning this test can be used) for the duration of the COVID-19 declaration under Section 564(b)(1) of the Act, 21 U.S.C. section 360bbb-3(b)(1), unless the authorization is terminated or revoked.  Performed at [facility], [address], [place], [place] [address]     Coagulation Studies: No results for  input(s): LABPROT, INR in the last 72 hours.  Urinalysis: Recent Labs    01/31/20 1113  COLORURINE YELLOW*  LABSPEC 1.009  PHURINE 6.0  GLUCOSEU NEGATIVE  HGBUR NEGATIVE  BILIRUBINUR NEGATIVE  KETONESUR NEGATIVE  PROTEINUR NEGATIVE  NITRITE NEGATIVE  LEUKOCYTESUR NEGATIVE      Imaging: DG Chest 1 View  Result Date: 01/30/2020 CLINICAL DATA:  Increasing lower extremity edema EXAM: CHEST  1 VIEW COMPARISON:  01/26/2020 FINDINGS: Cardiac shadow is enlarged. Aortic calcifications are noted. Postsurgical changes are again seen. Pacing device is again noted. Increasing vascular congestion with interstitial edema is noted worsened from the prior exam. Small left pleural effusion is noted. No bony abnormality is seen. IMPRESSION: Increasing CHF when compared with the prior exam Electronically Signed   By: Inez Catalina M.D.   On: 01/30/2020 09:08   DG Knee 1-2 Views Right  Result Date: 01/31/2020 CLINICAL DATA:  Right knee pain and swelling. EXAM: RIGHT KNEE - 1-2 VIEW COMPARISON:  Plain films right knee 01/26/2020. FINDINGS: Small joint effusion is unchanged. There is no acute bony or joint abnormality. Joint spaces are preserved but there is osteophytosis about the which is most notable in the patellofemoral and lateral compartments. Chondrocalcinosis is again seen. Vascular clips the posterior soft  tissues medially again noted. Atherosclerosis. IMPRESSION: No acute abnormality. No change in a small knee joint effusion. Mild-to-moderate osteoarthritis. Chondrocalcinosis. Electronically Signed   By: Inge Rise M.D.   On: 01/31/2020 10:44   US RENAL  Result Date: 01/30/2020 CLINICAL DATA:  Worsening renal function. EXAM: RENAL / URINARY TRACT ULTRASOUND COMPLETE COMPARISON:  None. FINDINGS: Right Kidney: Renal measurements: 10.9 x 6.7 x 6.3 cm = volume: 242 mL. Increased echogenicity of renal parenchyma is noted suggesting medical renal disease. No mass or hydronephrosis visualized. Left Kidney: Renal measurements: 10.9 x 6.5 x 5.5 cm = volume: 204 mL. Increased echogenicity of renal parenchyma is noted suggesting medical renal disease. No mass or hydronephrosis visualized. Bladder: Appears normal for degree of bladder distention. Other: None. IMPRESSION: Increased echogenicity of renal parenchyma is noted suggesting medical renal disease. No hydronephrosis or renal obstruction is noted. Electronically Signed   By: Marijo Conception M.D.   On: 01/30/2020 09:01     Medications:    . sodium chloride   Intravenous Once  . calcium acetate  667 mg Oral TID WC  . Chlorhexidine Gluconate Cloth  6 each Topical Daily  . docusate sodium  100 mg Oral Daily  . furosemide  20 mg Intravenous Once  . furosemide  60 mg Intravenous BID  . hydrALAZINE  25 mg Oral BID  . insulin aspart  0-6 Units Subcutaneous TID WC  . lidocaine  1 patch Transdermal Q24H  . [START ON 02/01/2020] pantoprazole  40 mg Oral Q breakfast  . polyethylene glycol  17 g Oral Daily  . predniSONE  50 mg Oral Q breakfast  . tamsulosin  0.4 mg Oral QPC supper  . cyanocobalamin  1,000 mcg Oral Daily   acetaminophen **OR** acetaminophen, HYDROcodone-acetaminophen, naLOXone (NARCAN)  injection, prochlorperazine, traMADol  Assessment/ Plan:  Mr. LEAMON WENNBERG is a 80 y.o. white male with myelodysplastic syndrome, diabetes mellitus type  II, CVA, sick sinus syndrome status post pacemaker, status post TAVR, hypertension, atrial fibrillation, hearing loss, hyperlipidemia, systolic congestive heart failure, hepatic cirrhosis who was admitted to [facility] on 01/26/2020 for Acute on chronic diastolic heart failure (HCC) [I50.33] Peripheral edema [R60.9] Weakness [R53.1] Gout, unspecified cause, unspecified chronicity, unspecified site [M10.9]  1. Acute kidney injury: on chronic kidney disease stage IIIB: with proteinuria on urinalysis on admission.  Baseline creatinine of 1.59, GFR of 44 on 01/06/2020.  Chronic kidney disease secondary to hypertension and diabetes.  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  No recent IV contrast exposure.  - holding benazepril - creatinine trending downward.   2. Hypertension, atrial fibrillation and acute exacerbation of systolic congestive heart failure - IV furosemide - tamsulosin  3. Acute gout flare: secondary to hyperuricemia - started on prednisone  4. Hyperuricemia: etiology could be secondary to myelodysplastic syndrome itself.  Rasburicase as per oncology  5. Anemia with chronic kidney disease: hemoglobin 7.3, macrocytic.  PRBC transfusions on this admission. Another scheduled for today.    LOS: 5 Kayli Beal 1/7/20223:02 PM

## 2020-01-31 NOTE — Progress Notes (Signed)
    Durable Medical Equipment  (From admission, onward)         Start     Ordered   01/31/20 1554  For home use only DME Hospital bed  Once       Question Answer Comment  Length of Need Lifetime   Patient has (list medical condition): CHF, Myelodysplastic Syndrome   The above medical condition requires: Patient requires the ability to reposition frequently   Head must be elevated greater than: 30 degrees   Bed type Semi-electric   Hoyer Lift Yes   Support Surface: Gel Overlay      01/31/20 1553   01/31/20 1552  For home use only DME lightweight manual wheelchair with seat cushion  Once       Comments: Patient suffers from CHF and myelodysplastic syndrome which impairs their ability to perform daily activities like bathing, dressing, ambulating, and preparing meals in the home.  A walker will not resolve  issue with performing activities of daily living. A wheelchair will allow patient to safely perform daily activities. Patient is not able to propel themselves in the home using a standard weight wheelchair due to shortness of breath and weakness. Patient can self propel in the lightweight wheelchair. Length of need lifetime. Accessories: elevating leg rests (ELRs), wheel locks, extensions and anti-tippers, seat rest and back rest.   01/31/20 1553   01/28/20 1541  For home use only DME 3 n 1  Once        01/28/20 1540

## 2020-02-01 DIAGNOSIS — I5023 Acute on chronic systolic (congestive) heart failure: Secondary | ICD-10-CM | POA: Diagnosis not present

## 2020-02-01 LAB — TYPE AND SCREEN
ABO/RH(D): A POS
Antibody Screen: NEGATIVE
Unit division: 0

## 2020-02-01 LAB — COMPREHENSIVE METABOLIC PANEL
ALT: 60 U/L — ABNORMAL HIGH (ref 0–44)
AST: 20 U/L (ref 15–41)
Albumin: 3.2 g/dL — ABNORMAL LOW (ref 3.5–5.0)
Alkaline Phosphatase: 81 U/L (ref 38–126)
Anion gap: 12 (ref 5–15)
BUN: 78 mg/dL — ABNORMAL HIGH (ref 8–23)
CO2: 36 mmol/L — ABNORMAL HIGH (ref 22–32)
Calcium: 8.8 mg/dL — ABNORMAL LOW (ref 8.9–10.3)
Chloride: 97 mmol/L — ABNORMAL LOW (ref 98–111)
Creatinine, Ser: 1.57 mg/dL — ABNORMAL HIGH (ref 0.61–1.24)
GFR, Estimated: 45 mL/min — ABNORMAL LOW (ref >60)
Glucose, Bld: 173 mg/dL — ABNORMAL HIGH (ref 70–99)
Potassium: 3.5 mmol/L (ref 3.5–5.1)
Sodium: 145 mmol/L (ref 135–145)
Total Bilirubin: 5.3 mg/dL — ABNORMAL HIGH (ref 0.3–1.2)
Total Protein: 6.1 g/dL — ABNORMAL LOW (ref 6.5–8.1)

## 2020-02-01 LAB — MAGNESIUM: Magnesium: 2.9 mg/dL — ABNORMAL HIGH (ref 1.7–2.4)

## 2020-02-01 LAB — GLUCOSE, CAPILLARY
Glucose-Capillary: 166 mg/dL — ABNORMAL HIGH (ref 70–99)
Glucose-Capillary: 186 mg/dL — ABNORMAL HIGH (ref 70–99)
Glucose-Capillary: 246 mg/dL — ABNORMAL HIGH (ref 70–99)
Glucose-Capillary: 235 mg/dL — ABNORMAL HIGH (ref 70–99)

## 2020-02-01 LAB — PHOSPHORUS: Phosphorus: 4 mg/dL (ref 2.5–4.6)

## 2020-02-01 LAB — CBC
HCT: 25.3 % — ABNORMAL LOW (ref 39.0–52.0)
Hemoglobin: 8.2 g/dL — ABNORMAL LOW (ref 13.0–17.0)
MCH: 32.8 pg (ref 26.0–34.0)
MCHC: 32.4 g/dL (ref 30.0–36.0)
MCV: 101.2 fL — ABNORMAL HIGH (ref 80.0–100.0)
Platelets: 50 10*3/uL — ABNORMAL LOW (ref 150–400)
RBC: 2.5 MIL/uL — ABNORMAL LOW (ref 4.22–5.81)
RDW: 23.3 % — ABNORMAL HIGH (ref 11.5–15.5)
WBC: 2.6 10*3/uL — ABNORMAL LOW (ref 4.0–10.5)
nRBC: 2.7 % — ABNORMAL HIGH (ref 0.0–0.2)

## 2020-02-01 LAB — RASBURICASE - URIC ACID: Uric Acid, Serum: 8.1 mg/dL (ref 3.7–8.6)

## 2020-02-01 LAB — BPAM RBC
Blood Product Expiration Date: 202202022359
ISSUE DATE / TIME: 202201071648
Unit Type and Rh: 6200

## 2020-02-01 LAB — URINE CULTURE: Culture: NO GROWTH

## 2020-02-01 MED ORDER — FUROSEMIDE 40 MG PO TABS
80.0000 mg | ORAL_TABLET | Freq: Every day | ORAL | Status: DC
  Administered 2020-02-01 – 2020-02-04 (×4): 80 mg via ORAL
  Filled 2020-02-01 (×5): qty 2

## 2020-02-01 NOTE — Progress Notes (Signed)
PROGRESS NOTE    Evan [name]  JSH:[id number] DOB: Jun 05, 1940 DOA: 01/26/2020 PCP: Leonel Ramsay, MD   Brief Narrative:  Patient is a 80 year old male with history of chronic systolic heart failure, severe aortic stenosis status post TAVR, permanent A. fib, myelodysplastic syndrome, chronic anemia, type 2 diabetes, hypertension, stage IIIb CKD with a baseline creatinine of 1.6-2.0 who presented with complaints of bilateral lower extremity swelling.He was also complaining of dyspnea, orthopnea.  He also was noticed to have right knee effusion.  Patient was admitted for the management of acute on chronic congestive heart failure and acute gout.  He underwent arthrocentesis of the knee which showed urate crystals.PT recommending SNF on  discharge however wife wishes to take him home instead.  Assessment & Plan:  Acute on chronic systolic congestive heart failure: -Presented with bilateral leg swelling and elevated BNP on admission. -Echocardiogram done  on 12/08/2019 and showing global hypokinesis, mild concentric LVH, LVEF 45 to 50%, and indeterminate diastolic function. -Patient started on Lasix 60 twice daily.  His symptoms has improved and oxygen requirement improved.  Switch back to p.o. 80 mg in the morning and 40 mg in the evening - continue to monitor input and output, daily weight -On 2 L oxygen via nasal cannula-baseline.  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  His baseline creatinine ranges from 1.6-2.  Continue diuresis.  Kidney function improving.-Renal ultrasound shows creased echogenicity of renal parenchyma is noted suggesting medical renal disease. -Consulted nephrology-appreciate help.  Right knee effusion/acute gout flare/hyperuricemia:   -Underwent arthrocentesis in the emergency department.   -Synovial fluid significant neutrophils suggesting inflammatory arthritis, presence of urate crystals.  He has severely elevated uric acid  level.  -We will continue slow taper of the steroids of prednisone . Given 60 mg of IV Solu-Medrol on 01/27/19. He needs to follow-up with rheumatology as an outpatient. -Repeat x-ray of right knee shows no change in small effusion.  Mild to moderate arthritis and chondrocalcinosis. -Add tramadol as needed for pain control  Chronic macrocytic anemia/thrombocytopenia/symptomatic anemia: History of myelodysplastic syndrome.  Follows with Dr. Rogue Bussing.  He was a started on Revlimidwas started on 01/09/2020. Received total of 3 unit PRBC transfusion on 01/20/2020, 01/27/2020, 01/31/2020. -H&H improved.  Now in 8. -Revlimid is on hold as per oncology's recommendations.  Hemoptysis: -Improved.  Continue to hold Eliquis.  ?hemolytic anemia/tumor lysis syndrome: Elevated uric acid level, bilirubin, phosphorus.   -Elevated total bilirubin of 5.2, direct bilirubin: 2.4 and indirect bilirubin: 2.8. -Oncology Dr. Janese Banks was consulted-appreciate help-does not think it is tumor lysis as low-grade MDS is unlikely to cause significant degree of TLS. -G6PD level: WNL.  Hyperuricemia improved with rasburicase given on 1/7  Type diabetes mellitus: Takes glimepiride at home.  Continue sliding-scale insulin here.  Monitor blood sugars  Permanent A. IFO:YDXAJOINOM history of suchandcomplicated by sick sinus syndrome status post pacemaker placement in February 2019.  His Eliquis dose was reduced to 2.5 mg twice daily based on his renal function.  Eliquis discontinued on 1/6-in the setting of worsening thrombocytopenia and hemoptysis.   BPH:On  Flomax  Hyperphosphatemia: Most likely history with CKD.  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  PTH: Elevated at 72, calcium: 8.9.   -Continue PhosLo. -Appreciate nephrology's recommendations.  Elevated liver enzymes/elevated bilirubin: Negative hepatitis panel.  He has chronically elevated bilirubin as  per previous records.  As per ultrasound of the right upper quadrant on 11/21 he  has cirrhosis of the liver.   -Liver enzyme improving.  Total bilirubin: 5.3.  Continue to monitor  Hypertension: Blood pressure is stable.  Continue hydralazine 25 mg twice daily.  Continue to monitor blood pressure closely.  Debility/deconditioning: PT/OT recommended SNF placement however patient's wife wishes to take him home.  Goals of care: -Patient has overall poor prognosis in the setting of CHF, MDS, worsening renal function.  Discussed with patient's wife at the bedside. appreciate palliative care assistance.  Patient is improving overall.  His kidney function is improving uric acid level, phosphate level: Improving.  Breathing has improved.  Likely discharge home in next 1 to 2 days.  DVT prophylaxis: SCD, continue to hold Eliquis in the setting of thrombocytopenia  Code Status: Full code-confirmed with patient's wife Family Communication: Patient's wife present at bedside.  Plan of care discussed with patient in length and he verbalized understanding and agreed with it. Disposition Plan: PT/OT recommended SNF however patient's wife wishes to take patient home.  Consultants:   Nephrology  Oncology  Palliative care  Procedures:   Echo  Antimicrobials:   None  Status is: Inpatient  Dispo: The patient is from: Home              Anticipated d/c is to: Home              Anticipated d/c date is: 2 to 3 days              Patient currently is not medically stable to d/c.      Subjective: Patient seen and examined.  Wife at the bedside.  Tells me that patient looks much better this morning.  His leg swelling improved.  He slept well last night.  No acute events overnight.  Remained afebrile.  Objective: Vitals:   01/31/20 2145 01/31/20 2339 02/01/20 0448 02/01/20 0841  BP: 128/67 (!) 135/98 (!) 141/65 (!) 142/59  Pulse: 75 72 66 71  Resp: 20 16 17 16   Temp: 97.8 F (36.6 C) 98.4  F (36.9 C) 98.3 F (36.8 C) 98.4 F (36.9 C)  TempSrc: Oral     SpO2: 99% 99% 99% 98%  Weight:      Height:        Intake/Output Summary (Last 24 hours) at 02/01/2020 1354 Last data filed at 02/01/2020 1300 Gross per 24 hour  Intake 450 ml  Output 4200 ml  Net -3750 ml   Filed Weights   01/29/20 0700 01/30/20 0500 01/31/20 0500  Weight: 113.9 kg 110.9 kg 109 kg    Examination:  General exam: Appears calm and comfortable, elderly, on 2 L of oxygen via nasal cannula Respiratory system: Decreased breath sounds noted on the bases.  No wheezing or rhonchi or crackles noted. Cardiovascular system: S1 & S2 heard, RRR. No JVD, murmurs, rubs, gallops or clicks.  No pitting edema noted. Gastrointestinal system: Abdomen is nondistended, soft and nontender. No organomegaly or masses felt. Normal bowel sounds heard. Central nervous system: Alert and oriented. No focal neurological deficits. Extremities: Symmetric 5 x 5 power.  Right knee: Swollen, nontender on palpation, no erythema or warmth. Skin: No rashes, lesions or ulcers Psychiatry: Judgement and insight appear normal. Mood & affect appropriate.    Data Reviewed: I have personally reviewed following labs and imaging studies  CBC: Recent Labs  Lab 01/28/20 0525 01/28/20 1455 01/29/20 0533 01/29/20 1115 01/30/20 0654 01/31/20 0456 02/01/20 0555  WBC 7.3  --  5.7  --  4.6 3.5* 2.6*  NEUTROABS 5.3  --  3.6  --   --   --   --   HGB 7.6*   < > 7.2* 7.6* 7.3* 7.3* 8.2*  HCT 23.3*  --  22.6*  --  22.7* 23.4* 25.3*  MCV 100.4*  --  101.8*  --  102.7* 103.5* 101.2*  PLT 45*  --  43*  --  44* 50* 50*   < > = values in this interval not displayed.   Basic Metabolic Panel: Recent Labs  Lab 01/26/20 1300 01/26/20 1400 01/27/20 0718 01/28/20 0525 01/28/20 0810 01/29/20 0533 01/30/20 0654 01/31/20 0456 02/01/20 0555  NA 134*  --  135 137 136 137 139 140 145  K 5.1  --  5.1 4.3 4.4 4.1 3.9 3.8 3.5  CL 94*  --  96* 95* 94*  94* 93* 95* 97*  CO2 26  --  23 27 27 30  34* 32 36*  GLUCOSE 150*  --  196* 167* 194* 202* 191* 252* 173*  BUN 45*  --  64* 83* 82* 91* 92* 91* 78*  CREATININE 2.35*  --  2.60* 2.41* 2.38* 2.18* 2.00* 1.86* 1.57*  CALCIUM 9.0  --  9.0 9.0 8.9  8.9 8.9 8.7* 8.9 8.8*  MG 2.3  --  2.5*  --   --   --  3.0*  --  2.9*  PHOS  --    < > 5.6* 6.1*  --  5.7* 4.8* 3.9 4.0   < > = values in this interval not displayed.   GFR: Estimated Creatinine Clearance: 49.5 mL/min (A) (by C-G formula based on SCr of 1.57 mg/dL (H)). Liver Function Tests: Recent Labs  Lab 01/28/20 0525 01/28/20 0810 01/28/20 1414 01/29/20 0533 01/30/20 0654 02/01/20 0555  AST 68* 59*  --  42* 40 20  ALT 128* 122*  --  107* 103* 60*  ALKPHOS 102 98  --  94 95 81  BILITOT 5.1* 5.0* 5.2* 4.1* 4.0* 5.3*  PROT 6.8 6.7  --  6.6 6.5 6.1*  ALBUMIN 3.7 3.6  --  3.4* 3.5 3.2*   No results for input(s): LIPASE, AMYLASE in the last 168 hours. No results for input(s): AMMONIA in the last 168 hours. Coagulation Profile: No results for input(s): INR, PROTIME in the last 168 hours. Cardiac Enzymes: Recent Labs  Lab 01/29/20 1115  CKTOTAL 93   BNP (last 3 results) Recent Labs    12/06/19 1633  PROBNP 9,423*   HbA1C: No results for input(s): HGBA1C in the last 72 hours. CBG: Recent Labs  Lab 01/31/20 1145 01/31/20 1639 01/31/20 2111 02/01/20 0855 02/01/20 1215  GLUCAP 179* 199* 199* 166* 186*   Lipid Profile: No results for input(s): CHOL, HDL, LDLCALC, TRIG, CHOLHDL, LDLDIRECT in the last 72 hours. Thyroid Function Tests: No results for input(s): TSH, T4TOTAL, FREET4, T3FREE, THYROIDAB in the last 72 hours. Anemia Panel: No results for input(s): VITAMINB12, FOLATE, FERRITIN, TIBC, IRON, RETICCTPCT in the last 72 hours. Sepsis Labs: No results for input(s): PROCALCITON, LATICACIDVEN in the last 168 hours.  Recent Results (from the past 240 hour(s))  Body fluid culture     Status: None   Collection Time:  01/26/20  3:03 PM   Specimen: KNEE; Body Fluid  Result Value Ref Range Status   Specimen Description   Final    KNEE Performed at [facility], [address], [place], [place] [address]    Special Requests   Final    NONE Performed at  [place], [place] [address]    Gram Stain   Final    FEW WBC PRESENT, PREDOMINANTLY PMN NO ORGANISMS SEEN    Culture   Final    NO GROWTH Performed at [facility] [address] [place], [place] [address]    Report Status 01/30/2020 FINAL  Final  Resp Panel by RT-PCR (Flu A&B, Covid) Nasopharyngeal Swab     Status: None   Collection Time: 01/26/20  4:01 PM   Specimen: Nasopharyngeal Swab; Nasopharyngeal(NP) swabs in vial transport medium  Result Value Ref Range Status   SARS Coronavirus 2 by RT PCR NEGATIVE NEGATIVE Final    Comment: (NOTE) SARS-CoV-2 target nucleic acids are NOT DETECTED.  The SARS-CoV-2 RNA is generally detectable in upper respiratory specimens during the acute phase of infection. The lowest concentration of SARS-CoV-2 viral copies this assay can detect is 138 copies/mL. A negative result does not preclude SARS-Cov-2 infection and should not be used as the sole basis for treatment or other patient management decisions. A negative result may occur with  improper specimen collection/handling, submission of specimen other than nasopharyngeal swab, presence of viral mutation(s) within the areas targeted by this assay, and inadequate number of viral copies(<138 copies/mL). A negative result must be combined with clinical observations, patient history, and epidemiological information. The expected result is Negative.  Fact Sheet for Patients:  EntrepreneurPulse.com.au  Fact Sheet for Healthcare Providers:  IncredibleEmployment.be  This test is no t yet approved or cleared by the [place] FDA and  has been authorized for  detection and/or diagnosis of SARS-CoV-2 by FDA under an Emergency Use Authorization (EUA). This EUA will remain  in effect (meaning this test can be used) for the duration of the COVID-19 declaration under Section 564(b)(1) of the Act, 21 U.S.C.section 360bbb-3(b)(1), unless the authorization is terminated  or revoked sooner.       Influenza A by PCR NEGATIVE NEGATIVE Final   Influenza B by PCR NEGATIVE NEGATIVE Final    Comment: (NOTE) The Xpert Xpress SARS-CoV-2/FLU/RSV plus assay is intended as an aid in the diagnosis of influenza from Nasopharyngeal swab specimens and should not be used as a sole basis for treatment. Nasal washings and aspirates are unacceptable for Xpert Xpress SARS-CoV-2/FLU/RSV testing.  Fact Sheet for Patients: EntrepreneurPulse.com.au  Fact Sheet for Healthcare Providers: IncredibleEmployment.be  This test is not yet approved or cleared by the [place] FDA and has been authorized for detection and/or diagnosis of SARS-CoV-2 by FDA under an Emergency Use Authorization (EUA). This EUA will remain in effect (meaning this test can be used) for the duration of the COVID-19 declaration under Section 564(b)(1) of the Act, 21 U.S.C. section 360bbb-3(b)(1), unless the authorization is terminated or revoked.  Performed at [facility], [address], [place], [place] [address]   Urine Culture     Status: None   Collection Time: 01/31/20 11:13 AM   Specimen: Urine, Catheterized  Result Value Ref Range Status   Specimen Description   Final    URINE, CATHETERIZED Performed at [facility], [address], [place], [place] [address]    Special Requests   Final    NONE Performed at [facility], [address] [place], [place] [address]    Culture   Final    NO GROWTH Performed at [facility], [place] [address] [place], [place] [address]    Report Status 02/01/2020 FINAL  Final  Radiology Studies: DG Knee 1-2 Views Right  Result Date: 01/31/2020 CLINICAL DATA:  Right knee pain and swelling. EXAM: RIGHT KNEE - 1-2 VIEW COMPARISON:  Plain films right knee 01/26/2020. FINDINGS: Small joint effusion is unchanged. There is no acute bony or joint abnormality. Joint spaces are preserved but there is osteophytosis about the which is most notable in the patellofemoral and lateral compartments. Chondrocalcinosis is again seen. Vascular clips the posterior soft tissues medially again noted. Atherosclerosis. IMPRESSION: No acute abnormality. No change in a small knee joint effusion. Mild-to-moderate osteoarthritis. Chondrocalcinosis. Electronically Signed   By: Inge Rise M.D.   On: 01/31/2020 10:44    Scheduled Meds: . calcium acetate  667 mg Oral TID WC  . Chlorhexidine Gluconate Cloth  6 each Topical Daily  . docusate sodium  100 mg Oral Daily  . furosemide  80 mg Oral Daily  . hydrALAZINE  25 mg Oral BID  . insulin aspart  0-6 Units Subcutaneous TID WC  . lidocaine  1 patch Transdermal Q24H  . pantoprazole  40 mg Oral Q breakfast  . polyethylene glycol  17 g Oral Daily  . predniSONE  50 mg Oral Q breakfast  . tamsulosin  0.4 mg Oral QPC supper  . cyanocobalamin  1,000 mcg Oral Daily   Continuous Infusions:   LOS: 6 days   Time spent: 40 minutes.   Mckinley Jewel, MD [facility]  If 7PM-7AM, please contact night-coverage [email or web address] 02/01/2020, 1:54 PM

## 2020-02-02 DIAGNOSIS — I5023 Acute on chronic systolic (congestive) heart failure: Secondary | ICD-10-CM | POA: Diagnosis not present

## 2020-02-02 LAB — CBC
HCT: 25.9 % — ABNORMAL LOW (ref 39.0–52.0)
Hemoglobin: 7.9 g/dL — ABNORMAL LOW (ref 13.0–17.0)
MCH: 31.5 pg (ref 26.0–34.0)
MCHC: 30.5 g/dL (ref 30.0–36.0)
MCV: 103.2 fL — ABNORMAL HIGH (ref 80.0–100.0)
Platelets: 59 10*3/uL — ABNORMAL LOW (ref 150–400)
RBC: 2.51 MIL/uL — ABNORMAL LOW (ref 4.22–5.81)
RDW: 22.6 % — ABNORMAL HIGH (ref 11.5–15.5)
WBC: 2.4 10*3/uL — ABNORMAL LOW (ref 4.0–10.5)
nRBC: 1.7 % — ABNORMAL HIGH (ref 0.0–0.2)

## 2020-02-02 LAB — COMPREHENSIVE METABOLIC PANEL
ALT: 47 U/L — ABNORMAL HIGH (ref 0–44)
AST: 18 U/L (ref 15–41)
Albumin: 3.1 g/dL — ABNORMAL LOW (ref 3.5–5.0)
Alkaline Phosphatase: 82 U/L (ref 38–126)
Anion gap: 10 (ref 5–15)
BUN: 70 mg/dL — ABNORMAL HIGH (ref 8–23)
CO2: 39 mmol/L — ABNORMAL HIGH (ref 22–32)
Calcium: 9 mg/dL (ref 8.9–10.3)
Chloride: 100 mmol/L (ref 98–111)
Creatinine, Ser: 1.4 mg/dL — ABNORMAL HIGH (ref 0.61–1.24)
GFR, Estimated: 51 mL/min — ABNORMAL LOW (ref >60)
Glucose, Bld: 197 mg/dL — ABNORMAL HIGH (ref 70–99)
Potassium: 3.6 mmol/L (ref 3.5–5.1)
Sodium: 149 mmol/L — ABNORMAL HIGH (ref 135–145)
Total Bilirubin: 4.9 mg/dL — ABNORMAL HIGH (ref 0.3–1.2)
Total Protein: 6.1 g/dL — ABNORMAL LOW (ref 6.5–8.1)

## 2020-02-02 LAB — GLUCOSE, CAPILLARY
Glucose-Capillary: 168 mg/dL — ABNORMAL HIGH (ref 70–99)
Glucose-Capillary: 234 mg/dL — ABNORMAL HIGH (ref 70–99)
Glucose-Capillary: 234 mg/dL — ABNORMAL HIGH (ref 70–99)
Glucose-Capillary: 207 mg/dL — ABNORMAL HIGH (ref 70–99)

## 2020-02-02 LAB — PHOSPHORUS: Phosphorus: 4 mg/dL (ref 2.5–4.6)

## 2020-02-02 NOTE — Progress Notes (Signed)
PROGRESS NOTE    Evan [name]  [id number] DOB: 02/05/1940 DOA: 01/26/2020 PCP: Leonel Ramsay, MD   Brief Narrative:  Patient is a 80 year old male with history of chronic systolic heart failure, severe aortic stenosis status post TAVR, permanent A. fib, myelodysplastic syndrome, chronic anemia, type 2 diabetes, hypertension, stage IIIb CKD with a baseline creatinine of 1.6-2.0 who presented with complaints of bilateral lower extremity swelling.He was also complaining of dyspnea, orthopnea.  He also was noticed to have right knee effusion.  Patient was admitted for the management of acute on chronic congestive heart failure and acute gout.  He underwent arthrocentesis of the knee which showed urate crystals.PT recommending SNF on  discharge however wife wishes to take him home instead.  Assessment & Plan:  Acute on chronic systolic congestive heart failure: -Presented with bilateral leg swelling and elevated BNP on admission. -Echocardiogram done  on 12/08/2019 and showing global hypokinesis, mild concentric LVH, LVEF 45 to 50%, and indeterminate diastolic function. -Patient started on Lasix 60 twice daily.  His symptoms has improved and oxygen requirement improved.  Switch back to p.o. 80 mg in the morning and 40 mg in the evening - continue to monitor input and output, daily weight. He is -12.3 L & Lost about 17 lb of weight -On 5-6 L oxygen via nasal cannula-we will try to wean off of oxygen as tolerated.  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  His baseline creatinine ranges from 1.6-2.  Continue diuresis.  Kidney function improving.-Renal ultrasound shows creased echogenicity of renal parenchyma is noted suggesting medical renal disease. -Consulted nephrology-appreciate help.  Right knee effusion/acute gout flare/hyperuricemia:  -Underwent arthrocentesis in the emergency department.   -Synovial fluid significant neutrophils suggesting  inflammatory arthritis, presence of urate crystals.  He has severely elevated uric acid level.  -Repeat x-ray of right knee shows no change in small effusion.  Mild to moderate arthritis and chondrocalcinosis. -Cont. Prednisone & PPI for GI  Prophylaxis, tramadol as needed for pain control  Chronic macrocytic anemia/thrombocytopenia/acute symptomatic anemia: History of myelodysplastic syndrome.  Follows with Dr. Rogue Bussing.  He was a started on Revlimidwas started on 01/09/2020. Received total of 3 unit PRBC transfusion on 01/20/2020, 01/27/2020, 01/31/2020. -H&H -stable -Revlimid is on hold as per oncology's recommendations.  Hemoptysis: -Improved.  Continue to hold Eliquis.  ?hemolytic anemia/tumor lysis syndrome: Elevated uric acid level, bilirubin, phosphorus.   -Elevated total bilirubin of 5.2, direct bilirubin: 2.4 and indirect bilirubin: 2.8. -Oncology Dr. Janese Banks was consulted-appreciate help-does not think it is tumor lysis as low-grade MDS is unlikely to cause significant degree of TLS. -G6PD level: WNL.  Hyperuricemia improved with rasburicase given on 1/7  Type diabetes mellitus: Takes glimepiride at home.  Continue sliding-scale insulin here.  Monitor blood sugars  Permanent A. [id number] history of suchandcomplicated by sick sinus syndrome status post pacemaker placement in February 2019.  His Eliquis dose was reduced to 2.5 mg twice daily based on his renal function.  Eliquis discontinued on 1/6-in the setting of worsening thrombocytopenia and hemoptysis.   BPH:On  Flomax  Hyperphosphatemia: Most likely history with CKD.  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  PTH: Elevated at 72, calcium: 8.9.   -Continue PhosLo. -Appreciate nephrology's recommendations.  Elevated liver enzymes/elevated bilirubin: Negative hepatitis panel.  He has chronically elevated bilirubin as per previous records.  As per ultrasound  of the right upper quadrant on 11/21 he has cirrhosis of the liver.   -  Liver enzyme improving.  Total bilirubin: 5.3-->4.9.  Continue to monitor  Hypertension: Blood pressure is stable.  Continue hydralazine 25 mg twice daily.  Continue to monitor blood pressure closely.  Debility/deconditioning: PT/OT recommended SNF placement however patient's wife wishes to take him home.  Goals of care: -Patient has overall poor prognosis in the setting of CHF, MDS, worsening renal function.  Discussed with patient's wife at the bedside. appreciate palliative care assistance.  DVT prophylaxis: SCD, continue to hold Eliquis in the setting of thrombocytopenia  Code Status: Full code-confirmed with patient's wife Family Communication: Patient's wife present at bedside.  Plan of care discussed with patient in length and he verbalized understanding and agreed with it. Disposition Plan: PT/OT recommended SNF however patient's wife wishes to take patient home.  Consultants:   Nephrology  Oncology  Palliative care  Procedures:   Echo  Antimicrobials:   None  Status is: Inpatient  Dispo: The patient is from: Home              Anticipated d/c is to: Home              Anticipated d/c date is: Likely tomorrow               Patient currently is not medically stable to d/c.      Subjective: Patient seen and examined.  Wife at the bedside.  As per wife patient is doing much better.  No acute events overnight.  His leg swelling and bleeding has improved.  Objective: Vitals:   02/02/20 0130 02/02/20 0503 02/02/20 0807 02/02/20 1209  BP:  140/66 (!) 143/60 (!) 138/53  Pulse:  71 71 70  Resp:  16 17 17   Temp:  (!) 97.5 F (36.4 C) 97.8 F (36.6 C) 98.5 F (36.9 C)  TempSrc:      SpO2:  100% 99% 97%  Weight: 104.3 kg     Height:        Intake/Output Summary (Last 24 hours) at 02/02/2020 1409 Last data filed at 02/02/2020 1045 Gross per 24 hour  Intake 240 ml  Output 500 ml  Net -260 ml    Filed Weights   01/30/20 0500 01/31/20 0500 02/02/20 0130  Weight: 110.9 kg 109 kg 104.3 kg    Examination:  General exam: Appears calm and comfortable, elderly, on 5 L of oxygen via nasal cannula Respiratory system: Decreased breath sounds noted on the bases.  No wheezing or rhonchi or crackles noted. Cardiovascular system: S1 & S2 heard, RRR. No JVD, murmurs, rubs, gallops or clicks.  No pitting edema noted. Gastrointestinal system: Abdomen is nondistended, soft and nontender. No organomegaly or masses felt. Normal bowel sounds heard. Central nervous system: Alert and oriented. No focal neurological deficits. Extremities: Symmetric 5 x 5 power.  Right knee: Swollen, nontender on palpation, no erythema or warmth. Skin: No rashes, lesions or ulcers Psychiatry: Judgement and insight appear normal. Mood & affect appropriate.    Data Reviewed: I have personally reviewed following labs and imaging studies  CBC: Recent Labs  Lab 01/28/20 0525 01/28/20 1455 01/29/20 0533 01/29/20 1115 01/30/20 0654 01/31/20 0456 02/01/20 0555 02/02/20 0530  WBC 7.3  --  5.7  --  4.6 3.5* 2.6* 2.4*  NEUTROABS 5.3  --  3.6  --   --   --   --   --   HGB 7.6*   < > 7.2* 7.6* 7.3* 7.3* 8.2* 7.9*  HCT 23.3*  --  22.6*  --  22.7*  23.4* 25.3* 25.9*  MCV 100.4*  --  101.8*  --  102.7* 103.5* 101.2* 103.2*  PLT 45*  --  43*  --  44* 50* 50* 59*   < > = values in this interval not displayed.   Basic Metabolic Panel: Recent Labs  Lab 01/27/20 0718 01/28/20 0525 01/29/20 0533 01/30/20 0654 01/31/20 0456 02/01/20 0555 02/02/20 0530  NA 135   < > 137 139 140 145 149*  K 5.1   < > 4.1 3.9 3.8 3.5 3.6  CL 96*   < > 94* 93* 95* 97* 100  CO2 23   < > 30 34* 32 36* 39*  GLUCOSE 196*   < > 202* 191* 252* 173* 197*  BUN 64*   < > 91* 92* 91* 78* 70*  CREATININE 2.60*   < > 2.18* 2.00* 1.86* 1.57* 1.40*  CALCIUM 9.0   < > 8.9 8.7* 8.9 8.8* 9.0  MG 2.5*  --   --  3.0*  --  2.9*  --   PHOS 5.6*   < >  5.7* 4.8* 3.9 4.0 4.0   < > = values in this interval not displayed.   GFR: Estimated Creatinine Clearance: 54.3 mL/min (A) (by C-G formula based on SCr of 1.4 mg/dL (H)). Liver Function Tests: Recent Labs  Lab 01/28/20 0810 01/28/20 1414 01/29/20 0533 01/30/20 0654 02/01/20 0555 02/02/20 0530  AST 59*  --  42* 40 20 18  ALT 122*  --  107* 103* 60* 47*  ALKPHOS 98  --  94 95 81 82  BILITOT 5.0* 5.2* 4.1* 4.0* 5.3* 4.9*  PROT 6.7  --  6.6 6.5 6.1* 6.1*  ALBUMIN 3.6  --  3.4* 3.5 3.2* 3.1*   No results for input(s): LIPASE, AMYLASE in the last 168 hours. No results for input(s): AMMONIA in the last 168 hours. Coagulation Profile: No results for input(s): INR, PROTIME in the last 168 hours. Cardiac Enzymes: Recent Labs  Lab 01/29/20 1115  CKTOTAL 93   BNP (last 3 results) Recent Labs    12/06/19 1633  PROBNP 9,423*   HbA1C: No results for input(s): HGBA1C in the last 72 hours. CBG: Recent Labs  Lab 02/01/20 1215 02/01/20 1638 02/01/20 2142 02/02/20 0807 02/02/20 1158  GLUCAP 186* 246* 235* 168* 234*   Lipid Profile: No results for input(s): CHOL, HDL, LDLCALC, TRIG, CHOLHDL, LDLDIRECT in the last 72 hours. Thyroid Function Tests: No results for input(s): TSH, T4TOTAL, FREET4, T3FREE, THYROIDAB in the last 72 hours. Anemia Panel: No results for input(s): VITAMINB12, FOLATE, FERRITIN, TIBC, IRON, RETICCTPCT in the last 72 hours. Sepsis Labs: No results for input(s): PROCALCITON, LATICACIDVEN in the last 168 hours.  Recent Results (from the past 240 hour(s))  Body fluid culture     Status: None   Collection Time: 01/26/20  3:03 PM   Specimen: KNEE; Body Fluid  Result Value Ref Range Status   Specimen Description   Final    KNEE Performed at [facility], [address] [place], [place] [address]    Special Requests   Final    NONE Performed at [facility], [address] [place], [place] [address]    Gram Stain   Final    FEW  WBC PRESENT, PREDOMINANTLY PMN NO ORGANISMS SEEN    Culture   Final    NO GROWTH Performed at [facility], [place] [address] [place], [place] [address]    Report Status 01/30/2020 FINAL  Final  Resp Panel by RT-PCR (Flu A&B, Covid) Nasopharyngeal Swab     Status: None   Collection Time: 01/26/20  4:01 PM   Specimen: Nasopharyngeal Swab; Nasopharyngeal(NP) swabs in vial transport medium  Result Value Ref Range Status   SARS Coronavirus 2 by RT PCR NEGATIVE NEGATIVE Final    Comment: (NOTE) SARS-CoV-2 target nucleic acids are NOT DETECTED.  The SARS-CoV-2 RNA is generally detectable in upper respiratory specimens during the acute phase of infection. The lowest concentration of SARS-CoV-2 viral copies this assay can detect is 138 copies/mL. A negative result does not preclude SARS-Cov-2 infection and should not be used as the sole basis for treatment or other patient management decisions. A negative result may occur with  improper specimen collection/handling, submission of specimen other than nasopharyngeal swab, presence of viral mutation(s) within the areas targeted by this assay, and inadequate number of viral copies(<138 copies/mL). A negative result must be combined with clinical observations, patient history, and epidemiological information. The expected result is Negative.  Fact Sheet for Patients:  EntrepreneurPulse.com.au  Fact Sheet for Healthcare Providers:  IncredibleEmployment.be  This test is no t yet approved or cleared by the [place] FDA and  has been authorized for detection and/or diagnosis of SARS-CoV-2 by FDA under an Emergency Use Authorization (EUA). This EUA will remain  in effect (meaning this test can be used) for the duration of the COVID-19 declaration under Section 564(b)(1) of the Act, 21 U.S.C.section 360bbb-3(b)(1), unless the authorization is terminated  or revoked sooner.       Influenza A by PCR  NEGATIVE NEGATIVE Final   Influenza B by PCR NEGATIVE NEGATIVE Final    Comment: (NOTE) The Xpert Xpress SARS-CoV-2/FLU/RSV plus assay is intended as an aid in the diagnosis of influenza from Nasopharyngeal swab specimens and should not be used as a sole basis for treatment. Nasal washings and aspirates are unacceptable for Xpert Xpress SARS-CoV-2/FLU/RSV testing.  Fact Sheet for Patients: EntrepreneurPulse.com.au  Fact Sheet for Healthcare Providers: IncredibleEmployment.be  This test is not yet approved or cleared by the [place] FDA and has been authorized for detection and/or diagnosis of SARS-CoV-2 by FDA under an Emergency Use Authorization (EUA). This EUA will remain in effect (meaning this test can be used) for the duration of the COVID-19 declaration under Section 564(b)(1) of the Act, 21 U.S.C. section 360bbb-3(b)(1), unless the authorization is terminated or revoked.  Performed at [facility], [address], [place], [place] [address]   Urine Culture     Status: None   Collection Time: 01/31/20 11:13 AM   Specimen: Urine, Catheterized  Result Value Ref Range Status   Specimen Description   Final    URINE, CATHETERIZED Performed at [facility], [address] [place], [place] [address]    Special Requests   Final    NONE Performed at [facility], [address] [place], [place] [address]    Culture   Final    NO GROWTH Performed at [facility], [place] [address] [place], [place] [address]    Report Status 02/01/2020 FINAL  Final      Radiology Studies: No results found.  Scheduled Meds: . calcium acetate  667 mg Oral TID WC  . Chlorhexidine Gluconate Cloth  6 each Topical Daily  . docusate sodium  100 mg Oral Daily  . furosemide  80 mg Oral Daily  . hydrALAZINE  25 mg Oral BID  . insulin aspart  0-6 Units Subcutaneous  TID WC  . lidocaine  1 patch Transdermal Q24H  .  pantoprazole  40 mg Oral Q breakfast  . polyethylene glycol  17 g Oral Daily  . predniSONE  50 mg Oral Q breakfast  . tamsulosin  0.4 mg Oral QPC supper  . cyanocobalamin  1,000 mcg Oral Daily   Continuous Infusions:   LOS: 7 days   Time spent: 40 minutes.   Mckinley Jewel, MD [facility]  If 7PM-7AM, please contact night-coverage [email or web address] 02/02/2020, 2:09 PM

## 2020-02-02 NOTE — Progress Notes (Signed)
   02/02/20 1240  Clinical Encounter Type  Visited With Patient and family together  Visit Type Follow-up  Referral From Chaplain  Consult/Referral To Chaplain  Chaplain stopped by to check on Pt and his wife. Pt was sleeping, but Mrs. Woolworth was sitting at the bedside trying to rest. Pt's wife said she didn't rest well last night because she was worried about him.  Chaplain encouraged her to step out to take care of herself. She said she would. Chaplain will follow up and check on Pt later if time permits.

## 2020-02-02 NOTE — Progress Notes (Signed)
OT Cancellation Note  Patient Details Name: HEIDI LEMAY MRN: [id number] DOB: 1940-08-22   Cancelled Treatment:    Reason Eval/Treat Not Completed: Fatigue/lethargy limiting ability to participate   Attempted to engage pt in OT tx, but pt and his wife reports he is feeling fatigued this afternoon.  He reports feeling tired after working with physical therapy earlier today.  Pt very lethargic and shook his head "no", pt's wife agreeable to letting pt rest. Will continue to follow up at next opportunity.  Myrtie Hawk Colson Barco, OTR/L 02/02/20, 3:14 PM

## 2020-02-02 NOTE — Progress Notes (Signed)
Physical Therapy Treatment Patient Details Name: Evan [name] MRN: [id number] DOB: 08/21/1940 Today's Date: 02/02/2020    History of Present Illness Evan [name] is a 80 y.o. male with medical history significant for chronic systolic heart failure with most recent LVEF 45 to 50% in November 2021, severe aortic stenosis status post TAVR in April 2019, permanent atrial fibrillation chronically anticoagulated on Eliquis, myelodysplastic syndrome complicated by chronic anemia, type 2 diabetes mellitus, hypertension, stage IIIb chronic kidney disease with baseline creatinine 1.6-2.0, who is admitted to [facility] on 01/26/2020 with acute on chronic systolic heart failure after presenting from home to [facility] Emergency Department complaining of swelling in the bilateral lower extremities.    PT Comments    Patient seen for PT treatment after call from nurse stating spouse requested PT. Spouse was not at the bedside during treatment today. Patient was able to perform bed mobility with one person Maximal assistance. Patient continues to need significant assistance for trunk and BLE support. Patient was unable to stand with maximal assistance of one person with maximal cues for technique. Attempted sit to stand x 2 bouts with rest break between bouts due to fatigue. Sp02 90% in sitting position. Patient was able to scoot along edge of bed in sitting position with stand by assistance and cues for technique. Patient reports his back pain seems to be limiting standing efforts today, although he reports sitting upright was easier today than previous days. At this time, patient continues to require significant assistance with mobility efforts and is unable to stand with one person assistance. Recommend SNF placement at discharge. PT will continue to follow to maximize independence and address remaining functional limitations.    Follow Up Recommendations  SNF     Equipment Recommendations    (to be determined at next level of care)    Recommendations for Other Services       Precautions / Restrictions Precautions Precautions: Fall Restrictions Weight Bearing Restrictions: No    Mobility  Bed Mobility Overal bed mobility: Needs Assistance Bed Mobility: Supine to Sit;Sit to Supine     Supine to sit: Max assist Sit to supine: Max assist   General bed mobility comments: assistance for LE and trunk support. verbal cues for sequencing and technique. extra time required for task completion. cues for sequencing and technique  Transfers Overall transfer level: Needs assistance               General transfer comment: attempted to stand x 2 bouts. patient was unable to stand despite maximal assistance and verbal cues for technique to facilitate independence. minimal weight acceptance on BLE with standing efforts. patient complains mostly that back pain is limiting standing abilities. patient is able to scoot towards head of bed in seated position with stand by assistance and cues for technique  Ambulation/Gait                 Stairs             Wheelchair Mobility    Modified Rankin (Stroke Patients Only)       Balance Overall balance assessment: Needs assistance Sitting-balance support: Feet supported Sitting balance-Leahy Scale: Fair Sitting balance - Comments: no loss of balance in sitting position.                                    Cognition Arousal/Alertness: Lethargic Behavior During Therapy: WFL for  tasks assessed/performed Overall Cognitive Status: Difficult to assess                                 General Comments: no family at the bedside at this time. patient is lethargic and needs cues to remain alert and for particiaption. patient able to follow single step commands consistently      Exercises      General Comments        Pertinent Vitals/Pain Pain Assessment: Faces Faces Pain Scale: Hurts  whole lot Pain Location: mid back, bilateral knees Pain Descriptors / Indicators: Discomfort Pain Intervention(s): Monitored during session (nurse gave pain patch during PT session)    Home Living                      Prior Function            PT Goals (current goals can now be found in the care plan section) Acute Rehab PT Goals Patient Stated Goal: to go home PT Goal Formulation: With patient Time For Goal Achievement: 02/11/20 Potential to Achieve Goals: Poor Progress towards PT goals: Progressing toward goals    Frequency    Min 2X/week      PT Plan Current plan remains appropriate    Co-evaluation              AM-PAC PT "6 Clicks" Mobility   Outcome Measure  Help needed turning from your back to your side while in a flat bed without using bedrails?: A Lot Help needed moving from lying on your back to sitting on the side of a flat bed without using bedrails?: A Lot Help needed moving to and from a bed to a chair (including a wheelchair)?: Total Help needed standing up from a chair using your arms (e.g., wheelchair or bedside chair)?: Total Help needed to walk in hospital room?: Total Help needed climbing 3-5 steps with a railing? : Total 6 Click Score: 8    End of Session Equipment Utilized During Treatment: Oxygen Activity Tolerance: Patient limited by fatigue;Patient limited by lethargy Patient left: in bed;with call bell/phone within reach;with bed alarm set Nurse Communication: Mobility status PT Visit Diagnosis: Unsteadiness on feet (R26.81);Other abnormalities of gait and mobility (R26.89);Muscle weakness (generalized) (M62.81);Pain Pain - Right/Left:  (bilateral) Pain - part of body:  (mid back and bilateral knees)     Time: 9767-3419 PT Time Calculation (min) (ACUTE ONLY): 24 min  Charges:  $Therapeutic Activity: 23-37 mins                     Minna Merritts, PT, MPT    Percell Locus 02/02/2020, 2:54 PM

## 2020-02-03 ENCOUNTER — Inpatient Hospital Stay: Payer: Medicare Other | Attending: Obstetrics and Gynecology

## 2020-02-03 ENCOUNTER — Inpatient Hospital Stay: Payer: Medicare Other

## 2020-02-03 DIAGNOSIS — D4620 Refractory anemia with excess of blasts, unspecified: Secondary | ICD-10-CM

## 2020-02-03 DIAGNOSIS — I5023 Acute on chronic systolic (congestive) heart failure: Secondary | ICD-10-CM | POA: Diagnosis not present

## 2020-02-03 LAB — COMPREHENSIVE METABOLIC PANEL
ALT: 40 U/L (ref 0–44)
AST: 18 U/L (ref 15–41)
Albumin: 2.9 g/dL — ABNORMAL LOW (ref 3.5–5.0)
Alkaline Phosphatase: 77 U/L (ref 38–126)
Anion gap: 13 (ref 5–15)
BUN: 65 mg/dL — ABNORMAL HIGH (ref 8–23)
CO2: 36 mmol/L — ABNORMAL HIGH (ref 22–32)
Calcium: 8.9 mg/dL (ref 8.9–10.3)
Chloride: 96 mmol/L — ABNORMAL LOW (ref 98–111)
Creatinine, Ser: 1.23 mg/dL (ref 0.61–1.24)
GFR, Estimated: >60 mL/min (ref >60)
Glucose, Bld: 190 mg/dL — ABNORMAL HIGH (ref 70–99)
Potassium: 3.5 mmol/L (ref 3.5–5.1)
Sodium: 145 mmol/L (ref 135–145)
Total Bilirubin: 4.8 mg/dL — ABNORMAL HIGH (ref 0.3–1.2)
Total Protein: 5.8 g/dL — ABNORMAL LOW (ref 6.5–8.1)

## 2020-02-03 LAB — CBC
HCT: 26.6 % — ABNORMAL LOW (ref 39.0–52.0)
Hemoglobin: 8.3 g/dL — ABNORMAL LOW (ref 13.0–17.0)
MCH: 32.3 pg (ref 26.0–34.0)
MCHC: 31.2 g/dL (ref 30.0–36.0)
MCV: 103.5 fL — ABNORMAL HIGH (ref 80.0–100.0)
Platelets: 67 10*3/uL — ABNORMAL LOW (ref 150–400)
RBC: 2.57 MIL/uL — ABNORMAL LOW (ref 4.22–5.81)
RDW: 22.1 % — ABNORMAL HIGH (ref 11.5–15.5)
WBC: 2.8 10*3/uL — ABNORMAL LOW (ref 4.0–10.5)
nRBC: 1.4 % — ABNORMAL HIGH (ref 0.0–0.2)

## 2020-02-03 LAB — PHOSPHORUS: Phosphorus: 3.6 mg/dL (ref 2.5–4.6)

## 2020-02-03 LAB — GLUCOSE, CAPILLARY
Glucose-Capillary: 160 mg/dL — ABNORMAL HIGH (ref 70–99)
Glucose-Capillary: 195 mg/dL — ABNORMAL HIGH (ref 70–99)
Glucose-Capillary: 244 mg/dL — ABNORMAL HIGH (ref 70–99)
Glucose-Capillary: 223 mg/dL — ABNORMAL HIGH (ref 70–99)

## 2020-02-03 MED ORDER — APIXABAN 5 MG PO TABS
5.0000 mg | ORAL_TABLET | Freq: Two times a day (BID) | ORAL | Status: DC
  Administered 2020-02-03 – 2020-02-04 (×2): 5 mg via ORAL
  Filled 2020-02-03 (×3): qty 1

## 2020-02-03 MED ORDER — APIXABAN 2.5 MG PO TABS
2.5000 mg | ORAL_TABLET | Freq: Two times a day (BID) | ORAL | Status: DC
Start: 2020-02-03 — End: 2020-02-03

## 2020-02-03 NOTE — TOC Progression Note (Addendum)
Transition of Care [facility]) - Progression Note    Patient Details  Name: Evan [name] MRN: [id number] Date of Birth: 01/07/1941  [facility] of [facility]) CM/SW Contact  Shelbie Ammons, RN Phone Number: 02/03/2020, 2:36 PM  Clinical Narrative:   RNCM met with patient and wife in room at request of mobility specialist who reported wife had questions about additional equipment. In room patient sitting up to bedside with wife right beside him. Wife is questioning about the possibility of having a board to help patient slide from bed to chair. Wife reports that she has gotten the bed delivered but decided not to get the wheelchair as she can borrow one from someone else. RNCM reached out to PT dept to request that they assess patient for appropriateness of using this type of equipment in the home.   3:30pm: RNCM notified by CM [place] that patient's wife had reached out to her as well about patient going home today. Called into room and spoke with wife who reports she was getting confused about who she had talked to. Wife reports that she hasn't gotten delivery of equipment yet because patient doesn't want her to leave him here. Discussed extreme importance of having equipment at home before patient gets there due to his mobility issues.  RNCM reached back out to [place] with [employer] to request that they attempt to deliver again, ASAP.     Expected Discharge Plan: [place] Barriers to Discharge: Continued Medical Work up  Expected Discharge Plan and Services Expected Discharge Plan: [place] arrangements for the past 2 months: [place]                 DME Arranged: Hospital bed,Lightweight manual wheelchair with seat cushion DME Agency: [employer] Date DME Agency Contacted: 01/31/20 Time DME Agency Contacted: 1604 Representative spoke with at DME Agency: [place]: [place]: [place]  ([place]) Date [place]: 01/31/20 Time [place]: [place] Representative spoke with at [place]: [place] ([place]) Interventions    Readmission Risk Interventions Readmission Risk Prevention Plan 01/27/2020  Transportation Screening Complete  PCP or Specialist Appt within 3-5 Days Complete  HRI or [place] Complete  Social Work Consult for [place] Planning/Counseling Complete  Palliative Care Screening Not Applicable  Medication Review Press photographer) Complete  Some recent data might be hidden

## 2020-02-03 NOTE — Plan of Care (Signed)
Pt eating supper at this time,spouse assisting to feed.NAD noted.Pt alert and verbal, occasional confusion but follows commands,no indication of pain.Pt incont ,encouraged to void in urinal & bsc.Pt progressing with care plans.no changes in baseline.will cont to monitor.CL within reach.

## 2020-02-03 NOTE — Care Management Important Message (Signed)
Important Message  Patient Details  Name: Evan [name] MRN: [id number] Date of Birth: Mar 31, 1940   Medicare Important Message Given:  Yes     Dannette Barbara 02/03/2020, 11:18 AM

## 2020-02-03 NOTE — Progress Notes (Signed)
Mobility Specialist - Progress Note   02/03/20 1400  Mobility  Activity Dangled on edge of bed  Range of Motion/Exercises Right leg;Left leg (AP)  Level of Assistance Maximum assist, patient does 25-49%  Assistive Device None  Distance Ambulated (ft) 0 ft  Mobility Response Tolerated well  Mobility performed by Mobility specialist  $Mobility charge 1 Mobility    Pre Mobility: 64 HR, 97% SpO2 During Mobility: 70 HR, 86% SpO2 Post Mobility: 69 HR, 92% SpO2   Pt was lying in bed upon arrival utilizing 3L [name]. Pt agreed to session. Pt denied pain, nausea, or fatigue. Pt fairly flat and minimally responsive this date. Spouse at bedside to answer questions. Pt presents with R knee effusion and gout in L foot. Pt very limited with mobility this date. Pt able to get EOB with maxA. Upon sitting EOB, pt c/o feeling dizzy. Pt dangled EOB for 10 minutes demonstrating fair sitting-balance. Pt to trial standing to RW x3, but unsuccessful each attempt. Pt unable to elevate trunk from bedside. O2 desat to 86% after 1st attempt. PLB technique increased sats to 93% quickly. Pt performed ankle pumps but with noticeable weakness and limited ROM. Pt refused returning to supine at the end of session. Lunch entered and pt was instructed to use call bell when ready to lay down. Pt and wife showed understanding. Pt was left EOB with needs in reach and lunch tray in front of pt. Spouse inquiring about more DME to take home after d/c. Nurse and CSW both notified. Overall, pt tolerated well given barriers.   Evan [name] Mobility Specialist 02/03/20, 2:31 PM

## 2020-02-03 NOTE — Progress Notes (Signed)
PROGRESS NOTE    Evan [name]  [id number] DOB: 07-03-1940 DOA: 01/26/2020 PCP: Leonel Ramsay, MD   Brief Narrative:  Patient is a 80 year old male with history of chronic systolic heart failure, severe aortic stenosis status post TAVR, permanent A. fib, myelodysplastic syndrome, chronic anemia, type 2 diabetes, hypertension, stage IIIb CKD with a baseline creatinine of 1.6-2.0 who presented with complaints of bilateral lower extremity swelling.He was also complaining of dyspnea, orthopnea.  He also was noticed to have right knee effusion.  Patient was admitted for the management of acute on chronic congestive heart failure and acute gout.  He underwent arthrocentesis of the knee which showed urate crystals.PT recommending SNF on  discharge however wife wishes to take him home instead.  Assessment & Plan:  Acute on chronic systolic congestive heart failure: -Presented with bilateral leg swelling and elevated BNP on admission. -Echocardiogram done  on 12/08/2019 and showing global hypokinesis, mild concentric LVH, LVEF 45 to 50%, and indeterminate diastolic function. -Patient started on Lasix 60 twice daily.  His symptoms has improved and oxygen requirement improved.  Switch back to p.o. 80 mg in the morning and 40 mg in the evening - continue to monitor input and output, daily weight. He is -12.3 L & Lost about 17 lb of weight -On 3 L oxygen via nasal cannula-we will try to wean off of oxygen as tolerated.  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  His baseline creatinine ranges from 1.6-2.  Continue diuresis.  Kidney function improving.-Renal ultrasound shows creased echogenicity of renal parenchyma is noted suggesting medical renal disease. -Consulted nephrology-appreciate help-okay to discharge from nephrology standpoint.  Right knee effusion/acute gout flare/hyperuricemia:  -Underwent arthrocentesis in the emergency department.   -Synovial fluid  significant neutrophils suggesting inflammatory arthritis, presence of urate crystals.  He has severely elevated uric acid level.  -Repeat x-ray of right knee shows no change in small effusion.  Mild to moderate arthritis and chondrocalcinosis. -Cont. Prednisone & PPI for GI  Prophylaxis, tramadol as needed for pain control  Chronic macrocytic anemia/thrombocytopenia/acute symptomatic anemia: History of myelodysplastic syndrome.  Follows with Dr. Rogue Bussing.  He was a started on Revlimidwas started on 01/09/2020. Received total of 3 unit PRBC transfusion on 12/27,1/3 & 1/7 -H&H -stable -Revlimid is on hold as per oncology's recommendations.  ?hemolytic anemia/tumor lysis syndrome: Elevated uric acid level, bilirubin, phosphorus.   -Elevated total bilirubin of 5.2, direct bilirubin: 2.4 and indirect bilirubin: 2.8. -Oncology Dr. Janese Banks was consulted-appreciate help-does not think it is tumor lysis as low-grade MDS is unlikely to cause significant degree of TLS. -G6PD level: WNL.  Hyperuricemia improved with rasburicase given on 1/7 -Patient sees Dr. Larwance Sachs.  He will  evaluate the patient this afternoon.  Permanent A. DJ:[id number] history of suchandcomplicated by sick sinus syndrome status post pacemaker placement in February 2019.  His Eliquis dose was reduced to 2.5 mg twice daily based on his renal function.  Eliquis discontinued on 1/6-in the setting of worsening thrombocytopenia and hemoptysis & resumed on 1/10.  Hyperphosphatemia: Most likely history with CKD.  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  PTH: Elevated at 72, calcium: 8.9.   -Continue PhosLo. -Appreciate nephrology's recommendations.  Elevated liver enzymes/elevated bilirubin: Negative hepatitis panel.  He has chronically elevated bilirubin as per previous records.  As per ultrasound of the right upper quadrant on 11/21 he has cirrhosis of the  liver.   -Liver enzyme improving.  Total bilirubin:  5.3-->4.9.  Continue to monitor  Type diabetes mellitus: Takes glimepiride at home.  Continue sliding-scale insulin here.  Monitor blood sugars  BPH:On  Flomax  Hemoptysis: -Resolved.  Will resume Eliquis today.  Hypertension: Blood pressure is stable.  Continue hydralazine 25 mg twice daily.  Continue to monitor blood pressure closely.  Debility/deconditioning: PT/OT recommended SNF placement however patient's wife wishes to take him home.  Goals of care: -Patient has overall poor prognosis in the setting of CHF, MDS, worsening renal function.  Discussed with patient's wife at the bedside. appreciate palliative care assistance.  Overall clinically patient is improving.  His breathing has improved and his Lasix was switched from IV to p.o.  His kidney function is back to baseline.  Okay to discharge from nephrology standpoint.Marland Kitchen  CBC-stable.  No further episodes of hemoptysis. Resumed Eliquis. Await PT eval & DME equipment yet to deliver at home. Will DC patient home tomorrow if okay with oncology.   DVT prophylaxis: SCD, Eliquis  Code Status: Full code-confirmed with patient's wife Family Communication: Patient's wife present at bedside.  Plan of care discussed with patient in length and he verbalized understanding and agreed with it. Disposition Plan: PT/OT recommended SNF however patient's wife wishes to take patient home.  Consultants:   Nephrology  Oncology  Palliative care  Procedures:   Echo  Antimicrobials:   None  Status is: Inpatient  Dispo: The patient is from: Home              Anticipated d/c is to: Home              Anticipated d/c date is: Likely tomorrow               Patient currently is not medically stable to d/c.      Subjective: Patient seen and examined.  Wife at the bedside.  Reports that patient had rough last night due to urinary difficulty.  Remained afebrile.  His breathing and leg  swelling has improved.  Right knee swelling and pain has improved.  Requiring 3 L of oxygen via nasal cannula.  No acute events overnight.   Objective: Vitals:   02/03/20 0445 02/03/20 0751 02/03/20 1124 02/03/20 1538  BP: (!) 155/70 (!) 144/59 140/63 (!) 151/73  Pulse: 65 68 64 69  Resp: 16 15 16 16   Temp: 98.2 F (36.8 C) 98.4 F (36.9 C) 98.6 F (37 C) 98.6 F (37 C)  TempSrc:      SpO2: 100% 100% 99% 100%  Weight:      Height:        Intake/Output Summary (Last 24 hours) at 02/03/2020 1545 Last data filed at 02/03/2020 1059 Gross per 24 hour  Intake 0 ml  Output -  Net 0 ml   Filed Weights   01/30/20 0500 01/31/20 0500 02/02/20 0130  Weight: 110.9 kg 109 kg 104.3 kg    Examination:  General exam: Appears calm and comfortable, elderly, on 3 L of oxygen via nasal cannula Respiratory system: No wheezing, rhonchi or crackles. Cardiovascular system: S1 & S2 heard, RRR. No JVD, murmurs, rubs, gallops or clicks.  No pitting edema noted. Gastrointestinal system: Abdomen is nondistended, soft and nontender. No organomegaly or masses felt. Normal bowel sounds heard. Central nervous system: Alert and oriented. No focal neurological deficits. Extremities: Symmetric 5 x 5 power.  Right knee: Mildly swollen, nontender on palpation, no erythema or warmth. Skin: No rashes, lesions or ulcers Psychiatry: Judgement and insight appear normal. Mood & affect  appropriate.    Data Reviewed: I have personally reviewed following labs and imaging studies  CBC: Recent Labs  Lab 01/28/20 0525 01/28/20 1455 01/29/20 0533 01/29/20 1115 01/30/20 0654 01/31/20 0456 02/01/20 0555 02/02/20 0530 02/03/20 0436  WBC 7.3  --  5.7  --  4.6 3.5* 2.6* 2.4* 2.8*  NEUTROABS 5.3  --  3.6  --   --   --   --   --   --   HGB 7.6*   < > 7.2*   < > 7.3* 7.3* 8.2* 7.9* 8.3*  HCT 23.3*  --  22.6*  --  22.7* 23.4* 25.3* 25.9* 26.6*  MCV 100.4*  --  101.8*  --  102.7* 103.5* 101.2* 103.2* 103.5*  PLT 45*   --  43*  --  44* 50* 50* 59* 67*   < > = values in this interval not displayed.   Basic Metabolic Panel: Recent Labs  Lab 01/30/20 0654 01/31/20 0456 02/01/20 0555 02/02/20 0530 02/03/20 0436  NA 139 140 145 149* 145  K 3.9 3.8 3.5 3.6 3.5  CL 93* 95* 97* 100 96*  CO2 34* 32 36* 39* 36*  GLUCOSE 191* 252* 173* 197* 190*  BUN 92* 91* 78* 70* 65*  CREATININE 2.00* 1.86* 1.57* 1.40* 1.23  CALCIUM 8.7* 8.9 8.8* 9.0 8.9  MG 3.0*  --  2.9*  --   --   PHOS 4.8* 3.9 4.0 4.0 3.6   GFR: Estimated Creatinine Clearance: 61.8 mL/min (by C-G formula based on SCr of 1.23 mg/dL). Liver Function Tests: Recent Labs  Lab 01/29/20 0533 01/30/20 0654 02/01/20 0555 02/02/20 0530 02/03/20 0436  AST 42* 40 20 18 18   ALT 107* 103* 60* 47* 40  ALKPHOS 94 95 81 82 77  BILITOT 4.1* 4.0* 5.3* 4.9* 4.8*  PROT 6.6 6.5 6.1* 6.1* 5.8*  ALBUMIN 3.4* 3.5 3.2* 3.1* 2.9*   No results for input(s): LIPASE, AMYLASE in the last 168 hours. No results for input(s): AMMONIA in the last 168 hours. Coagulation Profile: No results for input(s): INR, PROTIME in the last 168 hours. Cardiac Enzymes: Recent Labs  Lab 01/29/20 1115  CKTOTAL 93   BNP (last 3 results) Recent Labs    12/06/19 1633  PROBNP 9,423*   HbA1C: No results for input(s): HGBA1C in the last 72 hours. CBG: Recent Labs  Lab 02/02/20 1158 02/02/20 1655 02/02/20 2156 02/03/20 0751 02/03/20 1228  GLUCAP 234* 234* 207* 160* 195*   Lipid Profile: No results for input(s): CHOL, HDL, LDLCALC, TRIG, CHOLHDL, LDLDIRECT in the last 72 hours. Thyroid Function Tests: No results for input(s): TSH, T4TOTAL, FREET4, T3FREE, THYROIDAB in the last 72 hours. Anemia Panel: No results for input(s): VITAMINB12, FOLATE, FERRITIN, TIBC, IRON, RETICCTPCT in the last 72 hours. Sepsis Labs: No results for input(s): PROCALCITON, LATICACIDVEN in the last 168 hours.  Recent Results (from the past 240 hour(s))  Body fluid culture     Status: None    Collection Time: 01/26/20  3:03 PM   Specimen: KNEE; Body Fluid  Result Value Ref Range Status   Specimen Description   Final    KNEE Performed at [facility], [address] [place], [place] [address]    Special Requests   Final    NONE Performed at [facility], [address] [place], [place] [address]    Gram Stain   Final    FEW WBC PRESENT, PREDOMINANTLY PMN NO ORGANISMS SEEN    Culture   Final  NO GROWTH Performed at [facility], [place] [address] [place], [place] [address]    Report Status 01/30/2020 FINAL  Final  Resp Panel by RT-PCR (Flu A&B, Covid) Nasopharyngeal Swab     Status: None   Collection Time: 01/26/20  4:01 PM   Specimen: Nasopharyngeal Swab; Nasopharyngeal(NP) swabs in vial transport medium  Result Value Ref Range Status   SARS Coronavirus 2 by RT PCR NEGATIVE NEGATIVE Final    Comment: (NOTE) SARS-CoV-2 target nucleic acids are NOT DETECTED.  The SARS-CoV-2 RNA is generally detectable in upper respiratory specimens during the acute phase of infection. The lowest concentration of SARS-CoV-2 viral copies this assay can detect is 138 copies/mL. A negative result does not preclude SARS-Cov-2 infection and should not be used as the sole basis for treatment or other patient management decisions. A negative result may occur with  improper specimen collection/handling, submission of specimen other than nasopharyngeal swab, presence of viral mutation(s) within the areas targeted by this assay, and inadequate number of viral copies(<138 copies/mL). A negative result must be combined with clinical observations, patient history, and epidemiological information. The expected result is Negative.  Fact Sheet for Patients:  EntrepreneurPulse.com.au  Fact Sheet for Healthcare Providers:  IncredibleEmployment.be  This test is no t yet approved or cleared by the [place] FDA and  has been  authorized for detection and/or diagnosis of SARS-CoV-2 by FDA under an Emergency Use Authorization (EUA). This EUA will remain  in effect (meaning this test can be used) for the duration of the COVID-19 declaration under Section 564(b)(1) of the Act, 21 U.S.C.section 360bbb-3(b)(1), unless the authorization is terminated  or revoked sooner.       Influenza A by PCR NEGATIVE NEGATIVE Final   Influenza B by PCR NEGATIVE NEGATIVE Final    Comment: (NOTE) The Xpert Xpress SARS-CoV-2/FLU/RSV plus assay is intended as an aid in the diagnosis of influenza from Nasopharyngeal swab specimens and should not be used as a sole basis for treatment. Nasal washings and aspirates are unacceptable for Xpert Xpress SARS-CoV-2/FLU/RSV testing.  Fact Sheet for Patients: EntrepreneurPulse.com.au  Fact Sheet for Healthcare Providers: IncredibleEmployment.be  This test is not yet approved or cleared by the [place] FDA and has been authorized for detection and/or diagnosis of SARS-CoV-2 by FDA under an Emergency Use Authorization (EUA). This EUA will remain in effect (meaning this test can be used) for the duration of the COVID-19 declaration under Section 564(b)(1) of the Act, 21 U.S.C. section 360bbb-3(b)(1), unless the authorization is terminated or revoked.  Performed at [facility], [address], [place], [place] [address]   Urine Culture     Status: None   Collection Time: 01/31/20 11:13 AM   Specimen: Urine, Catheterized  Result Value Ref Range Status   Specimen Description   Final    URINE, CATHETERIZED Performed at [facility], [address], [place], [place] [address]    Special Requests   Final    NONE Performed at [facility], [address] [place], [place] [address]    Culture   Final    NO GROWTH Performed at [facility], [place] [address] [place], [place] [address]    Report Status  02/01/2020 FINAL  Final      Radiology Studies: No results found.  Scheduled Meds: . calcium acetate  667 mg Oral TID WC  . Chlorhexidine Gluconate Cloth  6 each Topical Daily  . docusate sodium  100 mg Oral Daily  .  furosemide  80 mg Oral Daily  . hydrALAZINE  25 mg Oral BID  . insulin aspart  0-6 Units Subcutaneous TID WC  . lidocaine  1 patch Transdermal Q24H  . pantoprazole  40 mg Oral Q breakfast  . polyethylene glycol  17 g Oral Daily  . predniSONE  50 mg Oral Q breakfast  . tamsulosin  0.4 mg Oral QPC supper  . cyanocobalamin  1,000 mcg Oral Daily   Continuous Infusions:   LOS: 8 days   Time spent: 40 minutes.   Mckinley Jewel, MD [facility]  If 7PM-7AM, please contact night-coverage [email or web address] 02/03/2020, 3:45 PM

## 2020-02-04 DIAGNOSIS — I5023 Acute on chronic systolic (congestive) heart failure: Secondary | ICD-10-CM | POA: Diagnosis not present

## 2020-02-04 LAB — PHOSPHORUS: Phosphorus: 3.8 mg/dL (ref 2.5–4.6)

## 2020-02-04 LAB — GLUCOSE, CAPILLARY
Glucose-Capillary: 165 mg/dL — ABNORMAL HIGH (ref 70–99)
Glucose-Capillary: 185 mg/dL — ABNORMAL HIGH (ref 70–99)
Glucose-Capillary: 339 mg/dL — ABNORMAL HIGH (ref 70–99)

## 2020-02-04 LAB — COMPREHENSIVE METABOLIC PANEL
ALT: 30 U/L (ref 0–44)
AST: 19 U/L (ref 15–41)
Albumin: 2.8 g/dL — ABNORMAL LOW (ref 3.5–5.0)
Alkaline Phosphatase: 71 U/L (ref 38–126)
Anion gap: 13 (ref 5–15)
BUN: 61 mg/dL — ABNORMAL HIGH (ref 8–23)
CO2: 35 mmol/L — ABNORMAL HIGH (ref 22–32)
Calcium: 8.9 mg/dL (ref 8.9–10.3)
Chloride: 95 mmol/L — ABNORMAL LOW (ref 98–111)
Creatinine, Ser: 1.16 mg/dL (ref 0.61–1.24)
GFR, Estimated: >60 mL/min (ref >60)
Glucose, Bld: 187 mg/dL — ABNORMAL HIGH (ref 70–99)
Potassium: 3.6 mmol/L (ref 3.5–5.1)
Sodium: 143 mmol/L (ref 135–145)
Total Bilirubin: 4.8 mg/dL — ABNORMAL HIGH (ref 0.3–1.2)
Total Protein: 5.7 g/dL — ABNORMAL LOW (ref 6.5–8.1)

## 2020-02-04 LAB — CBC
HCT: 26.2 % — ABNORMAL LOW (ref 39.0–52.0)
Hemoglobin: 8.1 g/dL — ABNORMAL LOW (ref 13.0–17.0)
MCH: 31.8 pg (ref 26.0–34.0)
MCHC: 30.9 g/dL (ref 30.0–36.0)
MCV: 102.7 fL — ABNORMAL HIGH (ref 80.0–100.0)
Platelets: 69 10*3/uL — ABNORMAL LOW (ref 150–400)
RBC: 2.55 MIL/uL — ABNORMAL LOW (ref 4.22–5.81)
RDW: 21.7 % — ABNORMAL HIGH (ref 11.5–15.5)
WBC: 3.2 10*3/uL — ABNORMAL LOW (ref 4.0–10.5)
nRBC: 1.6 % — ABNORMAL HIGH (ref 0.0–0.2)

## 2020-02-04 MED ORDER — DOCUSATE SODIUM 100 MG PO CAPS: 100.0000 mg | ORAL_CAPSULE | Freq: Every day | ORAL | 0 refills | Status: AC

## 2020-02-04 MED ORDER — POLYETHYLENE GLYCOL 3350 17 G PO PACK: 17.0000 g | PACK | Freq: Every day | ORAL | 0 refills | Status: AC

## 2020-02-04 MED ORDER — ALLOPURINOL 100 MG PO TABS: 100.0000 mg | ORAL_TABLET | Freq: Every day | ORAL | 2 refills | Status: AC

## 2020-02-04 NOTE — Progress Notes (Signed)
[facility]) Hospital Liaison RN note:  Request made from Dr. Doristine Bosworth and Jhonnie Garner, [facility] to speak with patient and spouse, Jinny Sanders regarding hospice services at home after discharge. Provided education related to hospice philosophy and services offered. Questions answered and spouse verbalized understanding. Misty Green, TOC present in room to discuss DME needs as well at discharge. At this time, spouse states that she would like to discuss information with all her family before a decision is made regarding hospice. [facility] information and contact numbers provided to spouse.  Thank you.  Zandra Abts, RN [facility] Liaison [phone]

## 2020-02-04 NOTE — TOC Progression Note (Signed)
Transition of Care [facility]) - Progression Note    Patient Details  Name: Evan [name] MRN: [id number] Date of Birth: April 13, 1940  Transition [facility]) CM/SW Contact  Shelbie Ammons, RN Phone Number: 02/04/2020, 9:50 AM  Clinical Narrative:   RNCM received phone call x2 from wife regarding delivery of equipment. RNCM reached out to [place] with [employer] who will get in touch with delivery person to try and find out about delivery of equipment. RNCM met in room with Kieth Brightly from [facility] to discuss services. At this time patient and wife want to get him home and talk with family. Wife again asking about transfer board, discussed that PT would be coming in to evaluate.     Expected Discharge Plan: [place] Barriers to Discharge: Continued Medical Work up  Expected Discharge Plan and Services Expected Discharge Plan: [place] arrangements for the past 2 months: [place]                 DME Arranged: Hospital bed,Lightweight manual wheelchair with seat cushion DME Agency: [employer] Date DME Agency Contacted: 01/31/20 Time DME Agency Contacted: 1604 Representative spoke with at DME Agency: [place]: [place]: [place] ([place]) Date [place]: 01/31/20 Time [place]: [place] Representative spoke with at [place]: [place] ([place]) Interventions    Readmission Risk Interventions Readmission Risk Prevention Plan 01/27/2020  Transportation Screening Complete  PCP or Specialist Appt within 3-5 Days Complete  HRI or [place] Complete  Social Work Consult for [place] Planning/Counseling Complete  Palliative Care Screening Not Applicable  Medication Review Press photographer) Complete  Some recent data might be hidden

## 2020-02-04 NOTE — Plan of Care (Signed)

## 2020-02-04 NOTE — Plan of Care (Signed)
  Problem: Education: Goal: Knowledge of General Education information will improve Description: Including pain rating scale, medication(s)/side effects and non-pharmacologic comfort measures Outcome: Progressing   Problem: Health Behavior/Discharge Planning: Goal: Ability to manage health-related needs will improve Outcome: Progressing   Problem: Clinical Measurements: Goal: Ability to maintain clinical measurements within normal limits will improve Outcome: Progressing Goal: Will remain free from infection Outcome: Progressing Goal: Diagnostic test results will improve Outcome: Progressing Goal: Respiratory complications will improve Outcome: Progressing Goal: Cardiovascular complication will be avoided Outcome: Progressing   Problem: Activity: Goal: Risk for activity intolerance will decrease Outcome: Progressing   Problem: Nutrition: Goal: Adequate nutrition will be maintained Outcome: Progressing   Problem: Coping: Goal: Level of anxiety will decrease Outcome: Progressing   Problem: Elimination: Goal: Will not experience complications related to bowel motility Outcome: Progressing Goal: Will not experience complications related to urinary retention Outcome: Progressing   Problem: Safety: Goal: Ability to remain free from injury will improve Outcome: Progressing   Problem: Pain Managment: Goal: General experience of comfort will improve Outcome: Progressing   Problem: Skin Integrity: Goal: Risk for impaired skin integrity will decrease Outcome: Progressing   Problem: Education: Goal: Ability to demonstrate management of disease process will improve Outcome: Progressing Goal: Ability to verbalize understanding of medication therapies will improve Outcome: Progressing Goal: Individualized Educational Video(s) Outcome: Progressing   Problem: Activity: Goal: Capacity to carry out activities will improve Outcome: Progressing   Problem: Cardiac: Goal:  Ability to achieve and maintain adequate cardiopulmonary perfusion will improve Outcome: Progressing  Pt is being discharged today - will receive home health - supplies and medical equipment have been delivered to home - pt has 02 already in home.

## 2020-02-04 NOTE — Progress Notes (Signed)
   02/04/20 1115  Clinical Encounter Type  Visited With Patient and family together  Visit Type Follow-up  Referral From Chaplain  Consult/Referral To Chaplain  Chaplain stopped in to check on Pt and his wife. Pt's wife said she is trying to get a bed delivered to her home so Pt can go home. While chaplain was visiting with Pt and his wife, his granddaughter came in and chaplain cut her visit short Pt could spend time with his family.

## 2020-02-04 NOTE — Assessment & Plan Note (Signed)
80 year old male patient with multiple medical problems including congestive heart failure/chronic respiratory failure A. fib on anticoagulation-also low-grade MDS 5q deletion currently admitted to hospital for acute on chronic congestive heart failure/acute gout.  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 Deletion-currently off Revlimid [given thrombocytopenia 40s]; currently platelets 60s.  Hemoglobin 8.3.  Continue to hold Revlimid because of hematologic toxicity.  Plan Retacrit to keep hemoglobin above 8.  #Acute on chronic congestive heart failure-status post diuresis; improvement in respiratory status noted.  A. Fib-currently off Eliquis [given thrombocytopenia/anemia]-I think it is reasonable to start Eliquis as platelets are steadily increasing; and anemia is not from blood loss.  #Acute gout-elevated uric acid; on prednisone-symptomatic improvement noted.  #Discussed with the patient's wife-that the patient's prognosis is mainly determined by his congestive heart failure/chronic respiratory failure-where in MDS also complicates the clinical scenario.  Strongly recommend DNR/DNI.

## 2020-02-04 NOTE — Discharge Summary (Signed)
Physician Discharge Summary  Evan [name] [id number] DOB: 1940/12/12 DOA: 01/26/2020  PCP: Leonel Ramsay, MD  Admit date: 01/26/2020 Discharge date: 02/04/2020  Admitted From: Home Disposition:  Home with [facility]  Recommendations for Outpatient Follow-up:  1. Follow-up with PCP in 1 week 2. Repeat CBC and BMP on follow-up visit 3. Follow-up with oncology outpatient 4. Follow-up with rheumatology outpatient 5. Follow-up with cardiology outpatient  Home Health: Yes Equipment/Devices: Hospital bed Discharge Condition: Stable CODE STATUS: Full code Diet recommendation: Heart healthy/carb consistent diet  Brief/Interim Summary: Patient is a 80 year old male with history of chronic systolic heart failure, severe aortic stenosis status post TAVR, permanent A. fib, myelodysplastic syndrome, chronic anemia, type 2 diabetes, hypertension, stage IIIb CKD with a baseline creatinine of 1.6-2.0 who presented with complaints of bilateral lower extremity swelling.He was also complaining of dyspnea, orthopnea. He also was noticed to have right knee effusion. Patient was admitted for the management of acute on chronic congestive heart failure and acutegout. He underwent arthrocentesis of the knee which showed urate crystals.  Acute hypoxemic respiratory failure in the setting of acute on chronic systolic congestive heart failure: -Presented with bilateral leg swelling and elevated BNP on admission. -Echocardiogram done  on 12/08/2019 and showing global hypokinesis, mild concentric LVH, LVEF 45 to 50%, and indeterminate diastolic function. -Patient started on Lasix IV 60 twice daily.  His symptoms has improved and oxygen requirement improved.  Switched back to p.o. 80 mg in the morning and 40 mg in the evening (home dose)-remained stable. - continue to monitor input and output, daily weight. He is -12.3 L & Lost about 17 lb of weight -Currently requiring 3 L of oxygen via nasal cannula.  He has  oxygen at home we will continue same.  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  His baseline creatinine ranges from 1.6-2.  Continue diuresis. .-Renal ultrasound shows creased echogenicity of renal parenchyma is noted suggesting medical renal disease. -Consulted nephrology-appreciate help-okay to discharge from nephrology standpoint. -AKI resolved.  Right knee effusion/acute gout flare/hyperuricemia:  -Underwent arthrocentesis in the emergency department.   -Synovial fluid significant neutrophils suggesting inflammatory arthritis, presence of urate crystals.  He has severely elevated uric acid level.  -Repeat x-ray of right knee shows no change in small effusion.  Mild to moderate arthritis and chondrocalcinosis. -Finished prednisone 50 mg daily for 10 days.  His symptoms improved.  We will discharge him on allopurinol 100 mg daily and follow-up with rheumatology outpatient.    Chronic macrocytic anemia/thrombocytopenia/acute symptomatic anemia: History of myelodysplastic syndrome.  Follows with Dr. Rogue Bussing.  He was a started on Revlimidwas started on 01/09/2020. Received total of 3 unit PRBC transfusion on 12/27,1/3 & 1/7 -H&H -stable -Revlimid is on hold as per oncology's recommendations. -Advised to follow-up with oncology outpatient and discuss about Revlimid.  ?hemolytic anemia/tumor lysis syndrome: Elevated uric acid level, bilirubin, phosphorus.   -Elevated total bilirubin of 5.2, direct bilirubin: 2.4 and indirect bilirubin: 2.8. -Oncology Dr. Janese Banks was consulted-appreciate help-does not think it is tumor lysis as low-grade MDS is unlikely to cause significant degree of TLS. -G6PD level: WNL.  Hyperuricemia improved with rasburicase given on 1/7  Permanent A. [id number] history of suchandcomplicated by sick sinus syndrome status post pacemaker placement in February 2019.  His Eliquis dose was reduced to 2.5 mg twice daily based on his renal  function.  Eliquis discontinued on 1/6-in the setting of worsening thrombocytopenia and hemoptysis & resumed on 1/10.  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  PTH: Elevated at 72, calcium: 8.9.   -Patient started on PhosLo.  His phosphate level resulted within normal limits.    Elevated liver enzymes/elevated bilirubin: Negative hepatitis panel.  He has chronically elevated bilirubin as per previous records.  As per ultrasound of the right upper quadrant on 11/21 he has cirrhosis of the liver.   -Liver enzyme improving.  Total bilirubin: 5.3-->4.9.  -Repeat CMP with PCP outpatient.  Type diabetes mellitus: Patient started on sliding scale insulin.  Resume glimepiride at the time of discharge.  BPH: Continued Flomax  Hemoptysis: -Resolved.    Resumed Eliquis on 1/10.  No further episodes of hemoptysis.  Hypertension: Blood pressure remained stable.  Continued hydralazine 25 mg twice daily.    Debility/deconditioning: PT/OT recommended SNF placement however patient's wife wishes to take him home.  Goals of care: -Patient has overall poor prognosis in the setting of CHF, MDS, worsening renal function.    Wife is aware of that.  Appreciate palliative care assistance.  Wife wishes to keep patient full code for now and refused hospice care at this time.  Discharge Diagnoses:  Acute hypoxemic respiratory failure in the setting of acute on chronic systolic congestive heart failure AKI on CKD stage IIIb Right knee effusion/acute gout flare Chronic macrocytic anemia/thrombocytopenia/acute symptomatic anemia in the setting of myelodysplastic syndrome ?  Hemolytic anemia/tumor lysis syndrome Permanent A. fib Hyperphosphatemia Hyperuricemia Elevated liver enzymes/elevated bilirubin Type 2 diabetes mellitus BPH Hemoptysis Hypertension Debility/deconditioning  Discharge  Instructions  Discharge Instructions    Diet - low sodium heart healthy   Complete by: As directed    Discharge instructions   Complete by: As directed    Follow-up with PCP in 1 week Repeat CBC and BMP on follow-up visit Follow-up with cardiology outpatient Follow-up with oncology outpatient Follow-up with rheumatology outpatient   Increase activity slowly   Complete by: As directed      Allergies as of 02/04/2020      Reactions   Niacin Other (See Comments), Rash   Red face and burns.   Niacin And Related Rash, Other (See Comments)   Red face and burns.   Metformin And Related Diarrhea   Statins Other (See Comments)   Myalgias, weakness      Medication List    STOP taking these medications   omeprazole 40 MG capsule Commonly known as: PRILOSEC   ondansetron 8 MG tablet Commonly known as: ZOFRAN     TAKE these medications   acetaminophen 650 MG CR tablet Commonly known as: TYLENOL Take 1,300 mg by mouth every 8 (eight) hours as needed for pain.   allopurinol 100 MG tablet Commonly known as: Zyloprim Take 1 tablet (100 mg total) by mouth daily.   amLODipine 2.5 MG tablet Commonly known as: NORVASC Take 1 tablet (2.5 mg total) by mouth daily.   apixaban 5 MG Tabs tablet Commonly known as: ELIQUIS Take 1 tablet (5 mg total) by mouth 2 (two) times daily.   benazepril 40 MG tablet Commonly known as: LOTENSIN Take 40 mg by mouth daily.   cyanocobalamin 1000 MCG tablet Take 1,000 mcg by mouth daily.   docusate sodium 100 MG capsule Commonly known as: COLACE Take 1 capsule (100 mg total) by mouth daily. Start taking on: February 05, 2020   ezetimibe 10 MG tablet Commonly known as: ZETIA TAKE 1 TABLET BY MOUTH EVERY DAY   furosemide 80  MG tablet Commonly known as: LASIX Take 80 mg daily in the mornings and 40 mg (half tab) in the evenings   glimepiride 2 MG tablet Commonly known as: AMARYL TAKE 1 TABLET (2 MG TOTAL) BY MOUTH DAILY WITH BREAKFAST What  changed:   how much to take  how to take this  when to take this  additional instructions   hydrALAZINE 25 MG tablet Commonly known as: APRESOLINE Take 1 tablet (25 mg total) by mouth 2 (two) times daily.   lenalidomide 5 MG capsule Commonly known as: REVLIMID Take 1 capsule (5 mg total) by mouth daily. [employer] Auth # [id number]     Date Obtained 01/01/2020   loratadine 10 MG tablet Commonly known as: CLARITIN Take 10 mg by mouth daily.   pantoprazole 40 MG tablet Commonly known as: PROTONIX Take 1 tablet (40 mg total) by mouth daily.   polyethylene glycol 17 g packet Commonly known as: MIRALAX / GLYCOLAX Take 17 g by mouth daily.   potassium chloride SA 20 MEQ tablet Commonly known as: KLOR-CON Take 1 tablet (20 mEq total) by mouth 2 (two) times daily.   prochlorperazine 10 MG tablet Commonly known as: COMPAZINE Take 1 tablet (10 mg total) by mouth every 6 (six) hours as needed for nausea or vomiting.   REFRESH OP Place 1 drop into both eyes daily as needed (for dry eyes).   tamsulosin 0.4 MG Caps capsule Commonly known as: FLOMAX TAKE 1 CAPSULE BY MOUTH ONCE DAILY   traZODone 50 MG tablet Commonly known as: DESYREL Take 50 mg by mouth at bedtime.            Durable Medical Equipment  (From admission, onward)         Start     Ordered   02/04/20 1124  For home use only DME oxygen  Once       Question Answer Comment  Length of Need Lifetime   Mode or (Route) Nasal cannula   Liters per Minute -1   Frequency Continuous (stationary and portable oxygen unit needed)   Oxygen delivery system Gas      02/04/20 1123   02/04/20 1114  For home use only DME Walker rolling  Once       Question Answer Comment  Walker: With [place]   Patient needs a walker to treat with the following condition Weakness      02/04/20 1113   01/31/20 1554  For home use only DME Hospital bed  Once       Question Answer Comment  Length of Need Lifetime   Patient has (list  medical condition): CHF, Myelodysplastic Syndrome   The above medical condition requires: Patient requires the ability to reposition frequently   Head must be elevated greater than: 30 degrees   Bed type Semi-electric   Hoyer Lift Yes   Support Surface: Gel Overlay      01/31/20 1553   01/31/20 1552  For home use only DME lightweight manual wheelchair with seat cushion  Once       Comments: Patient suffers from CHF and myelodysplastic syndrome which impairs their ability to perform daily activities like bathing, dressing, ambulating, and preparing meals in the home.  A walker will not resolve  issue with performing activities of daily living. A wheelchair will allow patient to safely perform daily activities. Patient is not able to propel themselves in the home using a standard weight wheelchair due to shortness of breath and weakness. Patient  can self propel in the lightweight wheelchair. Length of need lifetime. Accessories: elevating leg rests (ELRs), wheel locks, extensions and anti-tippers, seat rest and back rest.   01/31/20 1553   01/28/20 1541  For home use only DME 3 n 1  Once        01/28/20 1540          Follow-up Information    [facility] Follow up on 02/06/2020.   Specialty: Cardiology Why: at 2:30pm. Enter through the [place] entrance Contact information: [place] [address] [place] [phone]       Leonel Ramsay, MD Follow up in 1 week(s).   Specialty: Infectious Diseases Contact information: [place] [address] [phone]        [name], [name] M, MD .   Specialty: Cardiology Contact information: [address] [place] [address] [phone]        Constance Haw, MD .   Specialty: Cardiology Contact information: [address] [place] [address] [phone]        Cammie Sickle, MD  Follow up in 1 week(s).   Specialties: Internal Medicine, Oncology Contact information: [place] [address] [phone]        Emmaline Kluver., MD Follow up in 1 week(s).   Specialty: Rheumatology Contact information: [place] [address] [phone]              Allergies  Allergen Reactions  . Niacin Other (See Comments) and Rash    Red face and burns.  . Niacin And Related Rash and Other (See Comments)    Red face and burns.  . Metformin And Related Diarrhea  . Statins Other (See Comments)    Myalgias, weakness    Consultations:  Oncology  Nephrology  Palliative care   Procedures/Studies: DG Chest 1 View  Result Date: 01/30/2020 CLINICAL DATA:  Increasing lower extremity edema EXAM: CHEST  1 VIEW COMPARISON:  01/26/2020 FINDINGS: Cardiac shadow is enlarged. Aortic calcifications are noted. Postsurgical changes are again seen. Pacing device is again noted. Increasing vascular congestion with interstitial edema is noted worsened from the prior exam. Small left pleural effusion is noted. No bony abnormality is seen. IMPRESSION: Increasing CHF when compared with the prior exam Electronically Signed   By: Inez Catalina M.D.   On: 01/30/2020 09:08   DG Chest 2 View  Result Date: 01/26/2020 CLINICAL DATA:  Shortness of breath EXAM: CHEST - 2 VIEW COMPARISON:  December 07, 2019 FINDINGS: The mediastinal contour and cardiac silhouette are stable. Cardiac pacemaker is unchanged. The heart size is enlarged. Diffuse increased pulmonary interstitium is identified bilaterally. There is no pleural effusion or focal pneumonia. The bony structures are stable. IMPRESSION: Congestive heart failure. Electronically Signed   By: Abelardo Diesel M.D.   On: 01/26/2020 15:51   DG Knee 1-2 Views Right  Result Date: 01/31/2020 CLINICAL DATA:  Right knee pain and swelling. EXAM: RIGHT KNEE - 1-2 VIEW COMPARISON:   Plain films right knee 01/26/2020. FINDINGS: Small joint effusion is unchanged. There is no acute bony or joint abnormality. Joint spaces are preserved but there is osteophytosis about the which is most notable in the patellofemoral and lateral compartments. Chondrocalcinosis is again seen. Vascular clips the posterior soft tissues medially again noted. Atherosclerosis. IMPRESSION: No acute abnormality. No change in a small knee joint  effusion. Mild-to-moderate osteoarthritis. Chondrocalcinosis. Electronically Signed   By: Inge Rise M.D.   On: 01/31/2020 10:44   DG Knee 2 Views Right  Result Date: 01/26/2020 CLINICAL DATA:  Bilateral leg weakness EXAM: RIGHT KNEE - 1-2 VIEW COMPARISON:  None. FINDINGS: Degenerative changes with joint space narrowing and spurring most pronounced in the patellofemoral compartment. Small joint effusion. No acute bony abnormality. Specifically, no fracture, subluxation, or dislocation. Dense vascular calcifications. IMPRESSION: Degenerative changes as above. Small joint effusion. No acute bony abnormality. Electronically Signed   By: Rolm Baptise M.D.   On: 01/26/2020 14:02   US RENAL  Result Date: 01/30/2020 CLINICAL DATA:  Worsening renal function. EXAM: RENAL / URINARY TRACT ULTRASOUND COMPLETE COMPARISON:  None. FINDINGS: Right Kidney: Renal measurements: 10.9 x 6.7 x 6.3 cm = volume: 242 mL. Increased echogenicity of renal parenchyma is noted suggesting medical renal disease. No mass or hydronephrosis visualized. Left Kidney: Renal measurements: 10.9 x 6.5 x 5.5 cm = volume: 204 mL. Increased echogenicity of renal parenchyma is noted suggesting medical renal disease. No mass or hydronephrosis visualized. Bladder: Appears normal for degree of bladder distention. Other: None. IMPRESSION: Increased echogenicity of renal parenchyma is noted suggesting medical renal disease. No hydronephrosis or renal obstruction is noted. Electronically Signed   By: Marijo Conception M.D.    On: 01/30/2020 09:01       Subjective: Patient seen and examined.  Wife and granddaughter at the bedside.  No acute events overnight.  His breathing has improved.  Leg swelling has resolved.  Right knee pain and swelling has improved.  Wife wishes to take patient home today.  Remained afebrile.  Discharge Exam: Vitals:   02/04/20 0803 02/04/20 1121  BP: (!) 148/58 (!) 132/57  Pulse: 72 64  Resp: 16 16  Temp: 98.2 F (36.8 C) 97.7 F (36.5 C)  SpO2: 100% 99%   Vitals:   02/03/20 2323 02/04/20 0440 02/04/20 0803 02/04/20 1121  BP: (!) 145/60 140/74 (!) 148/58 (!) 132/57  Pulse: 67 73 72 64  Resp: 14 18 16 16   Temp: 97.6 F (36.4 C) 97.8 F (36.6 C) 98.2 F (36.8 C) 97.7 F (36.5 C)  TempSrc: Oral Oral    SpO2: 99% 100% 100% 99%  Weight:  103.8 kg    Height:        General: Pt is alert, awake, not in acute distress, on 3 L of oxygen via nasal cannula, sleepy but arousable and communicating well. Cardiovascular: RRR, S1/S2 +, no rubs, no gallops Respiratory: CTA bilaterally, no wheezing, no rhonchi Abdominal: Soft, NT, ND, bowel sounds + Extremities: no edema, no cyanosis    The results of significant diagnostics from this hospitalization (including imaging, microbiology, ancillary and laboratory) are listed below for reference.     Microbiology: Recent Results (from the past 240 hour(s))  Body fluid culture     Status: None   Collection Time: 01/26/20  3:03 PM   Specimen: KNEE; Body Fluid  Result Value Ref Range Status   Specimen Description   Final    KNEE Performed at [facility], [address] [place], [place] [address]    Special Requests   Final    NONE Performed at [facility], [address] [place], [place] [address]    Gram Stain   Final    FEW WBC PRESENT, PREDOMINANTLY PMN NO ORGANISMS SEEN    Culture   Final    NO GROWTH Performed at [facility], [address] [place], [place] [address]    Report Status  01/30/2020 FINAL  Final  Resp Panel by RT-PCR (Flu A&B, Covid) Nasopharyngeal Swab     Status: None   Collection Time: 01/26/20  4:01 PM   Specimen: Nasopharyngeal Swab; Nasopharyngeal(NP) swabs in vial transport medium  Result Value Ref Range Status   SARS Coronavirus 2 by RT PCR NEGATIVE NEGATIVE Final    Comment: (NOTE) SARS-CoV-2 target nucleic acids are NOT DETECTED.  The SARS-CoV-2 RNA is generally detectable in upper respiratory specimens during the acute phase of infection. The lowest concentration of SARS-CoV-2 viral copies this assay can detect is 138 copies/mL. A negative result does not preclude SARS-Cov-2 infection and should not be used as the sole basis for treatment or other patient management decisions. A negative result may occur with  improper specimen collection/handling, submission of specimen other than nasopharyngeal swab, presence of viral mutation(s) within the areas targeted by this assay, and inadequate number of viral copies(<138 copies/mL). A negative result must be combined with clinical observations, patient history, and epidemiological information. The expected result is Negative.  Fact Sheet for Patients:  EntrepreneurPulse.com.au  Fact Sheet for Healthcare Providers:  IncredibleEmployment.be  This test is no t yet approved or cleared by the [place] FDA and  has been authorized for detection and/or diagnosis of SARS-CoV-2 by FDA under an Emergency Use Authorization (EUA). This EUA will remain  in effect (meaning this test can be used) for the duration of the COVID-19 declaration under Section 564(b)(1) of the Act, 21 U.S.C.section 360bbb-3(b)(1), unless the authorization is terminated  or revoked sooner.       Influenza A by PCR NEGATIVE NEGATIVE Final   Influenza B by PCR NEGATIVE NEGATIVE Final    Comment: (NOTE) The Xpert Xpress SARS-CoV-2/FLU/RSV plus assay is intended as an aid in the diagnosis of  influenza from Nasopharyngeal swab specimens and should not be used as a sole basis for treatment. Nasal washings and aspirates are unacceptable for Xpert Xpress SARS-CoV-2/FLU/RSV testing.  Fact Sheet for Patients: EntrepreneurPulse.com.au  Fact Sheet for Healthcare Providers: IncredibleEmployment.be  This test is not yet approved or cleared by the [place] FDA and has been authorized for detection and/or diagnosis of SARS-CoV-2 by FDA under an Emergency Use Authorization (EUA). This EUA will remain in effect (meaning this test can be used) for the duration of the COVID-19 declaration under Section 564(b)(1) of the Act, 21 U.S.C. section 360bbb-3(b)(1), unless the authorization is terminated or revoked.  Performed at [facility], [address], [place], [place] [address]   Urine Culture     Status: None   Collection Time: 01/31/20 11:13 AM   Specimen: Urine, Catheterized  Result Value Ref Range Status   Specimen Description   Final    URINE, CATHETERIZED Performed at [facility], [address], [place], [place] [address]    Special Requests   Final    NONE Performed at [facility], [address], [place], [place] [address]    Culture   Final    NO GROWTH Performed at [facility], [place] [address], [place], [place] [address]    Report Status 02/01/2020 FINAL  Final     Labs: BNP (last 3 results) Recent Labs    01/26/20 1300  BNP [id number]   Basic Metabolic Panel: Recent Labs  Lab 01/30/20 0654 01/31/20 0456 02/01/20 0555 02/02/20 0530 02/03/20 0436 02/04/20 0610  NA 139 140 145 149* 145 143  K  3.9 3.8 3.5 3.6 3.5 3.6  CL 93* 95* 97* 100 96* 95*  CO2 34* 32 36* 39* 36* 35*  GLUCOSE 191* 252* 173* 197* 190* 187*  BUN 92* 91* 78* 70* 65* 61*  CREATININE 2.00* 1.86* 1.57* 1.40* 1.23 1.16  CALCIUM 8.7* 8.9 8.8* 9.0 8.9 8.9  MG 3.0*  --  2.9*  --   --   --   PHOS 4.8* 3.9 4.0  4.0 3.6 3.8   Liver Function Tests: Recent Labs  Lab 01/30/20 0654 02/01/20 0555 02/02/20 0530 02/03/20 0436 02/04/20 0610  AST 40 20 18 18 19   ALT 103* 60* 47* 40 30  ALKPHOS 95 81 82 77 71  BILITOT 4.0* 5.3* 4.9* 4.8* 4.8*  PROT 6.5 6.1* 6.1* 5.8* 5.7*  ALBUMIN 3.5 3.2* 3.1* 2.9* 2.8*   No results for input(s): LIPASE, AMYLASE in the last 168 hours. No results for input(s): AMMONIA in the last 168 hours. CBC: Recent Labs  Lab 01/29/20 0533 01/29/20 1115 01/31/20 0456 02/01/20 0555 02/02/20 0530 02/03/20 0436 02/04/20 0610  WBC 5.7   < > 3.5* 2.6* 2.4* 2.8* 3.2*  NEUTROABS 3.6  --   --   --   --   --   --   HGB 7.2*   < > 7.3* 8.2* 7.9* 8.3* 8.1*  HCT 22.6*   < > 23.4* 25.3* 25.9* 26.6* 26.2*  MCV 101.8*   < > 103.5* 101.2* 103.2* 103.5* 102.7*  PLT 43*   < > 50* 50* 59* 67* 69*   < > = values in this interval not displayed.   Cardiac Enzymes: Recent Labs  Lab 01/29/20 1115  CKTOTAL 93   BNP: Invalid input(s): POCBNP CBG: Recent Labs  Lab 02/03/20 1228 02/03/20 1650 02/03/20 2129 02/04/20 0805 02/04/20 1122  GLUCAP 195* 244* 223* 165* 185*   D-Dimer No results for input(s): DDIMER in the last 72 hours. Hgb A1c No results for input(s): HGBA1C in the last 72 hours. Lipid Profile No results for input(s): CHOL, HDL, LDLCALC, TRIG, CHOLHDL, LDLDIRECT in the last 72 hours. Thyroid function studies No results for input(s): TSH, T4TOTAL, T3FREE, THYROIDAB in the last 72 hours.  Invalid input(s): FREET3 Anemia work up No results for input(s): VITAMINB12, FOLATE, FERRITIN, TIBC, IRON, RETICCTPCT in the last 72 hours. Urinalysis    Component Value Date/Time   COLORURINE YELLOW (A) 01/31/2020 1113   APPEARANCEUR CLEAR (A) 01/31/2020 1113   LABSPEC 1.009 01/31/2020 1113   PHURINE 6.0 01/31/2020 1113   GLUCOSEU NEGATIVE 01/31/2020 1113   HGBUR NEGATIVE 01/31/2020 1113   BILIRUBINUR NEGATIVE 01/31/2020 1113   KETONESUR NEGATIVE 01/31/2020 1113    PROTEINUR NEGATIVE 01/31/2020 1113   UROBILINOGEN 0.2 05/24/2012 1535   NITRITE NEGATIVE 01/31/2020 1113   LEUKOCYTESUR NEGATIVE 01/31/2020 1113   Sepsis Labs Invalid input(s): PROCALCITONIN,  WBC,  LACTICIDVEN Microbiology Recent Results (from the past 240 hour(s))  Body fluid culture     Status: None   Collection Time: 01/26/20  3:03 PM   Specimen: KNEE; Body Fluid  Result Value Ref Range Status   Specimen Description   Final    KNEE Performed at [facility], [address] [place], [place] [address]    Special Requests   Final    NONE Performed at [facility], [address] [place], [place] [address]    Gram Stain   Final    FEW WBC PRESENT, PREDOMINANTLY PMN NO ORGANISMS SEEN    Culture   Final  NO GROWTH Performed at [facility], [place] [address], [place], [place] [address]    Report Status 01/30/2020 FINAL  Final  Resp Panel by RT-PCR (Flu A&B, Covid) Nasopharyngeal Swab     Status: None   Collection Time: 01/26/20  4:01 PM   Specimen: Nasopharyngeal Swab; Nasopharyngeal(NP) swabs in vial transport medium  Result Value Ref Range Status   SARS Coronavirus 2 by RT PCR NEGATIVE NEGATIVE Final    Comment: (NOTE) SARS-CoV-2 target nucleic acids are NOT DETECTED.  The SARS-CoV-2 RNA is generally detectable in upper respiratory specimens during the acute phase of infection. The lowest concentration of SARS-CoV-2 viral copies this assay can detect is 138 copies/mL. A negative result does not preclude SARS-Cov-2 infection and should not be used as the sole basis for treatment or other patient management decisions. A negative result may occur with  improper specimen collection/handling, submission of specimen other than nasopharyngeal swab, presence of viral mutation(s) within the areas targeted by this assay, and inadequate number of viral copies(<138 copies/mL). A negative result must be combined with clinical observations, patient  history, and epidemiological information. The expected result is Negative.  Fact Sheet for Patients:  EntrepreneurPulse.com.au  Fact Sheet for Healthcare Providers:  IncredibleEmployment.be  This test is no t yet approved or cleared by the [place] FDA and  has been authorized for detection and/or diagnosis of SARS-CoV-2 by FDA under an Emergency Use Authorization (EUA). This EUA will remain  in effect (meaning this test can be used) for the duration of the COVID-19 declaration under Section 564(b)(1) of the Act, 21 U.S.C.section 360bbb-3(b)(1), unless the authorization is terminated  or revoked sooner.       Influenza A by PCR NEGATIVE NEGATIVE Final   Influenza B by PCR NEGATIVE NEGATIVE Final    Comment: (NOTE) The Xpert Xpress SARS-CoV-2/FLU/RSV plus assay is intended as an aid in the diagnosis of influenza from Nasopharyngeal swab specimens and should not be used as a sole basis for treatment. Nasal washings and aspirates are unacceptable for Xpert Xpress SARS-CoV-2/FLU/RSV testing.  Fact Sheet for Patients: EntrepreneurPulse.com.au  Fact Sheet for Healthcare Providers: IncredibleEmployment.be  This test is not yet approved or cleared by the [place] FDA and has been authorized for detection and/or diagnosis of SARS-CoV-2 by FDA under an Emergency Use Authorization (EUA). This EUA will remain in effect (meaning this test can be used) for the duration of the COVID-19 declaration under Section 564(b)(1) of the Act, 21 U.S.C. section 360bbb-3(b)(1), unless the authorization is terminated or revoked.  Performed at [facility], [address] [place], [place] [address]   Urine Culture     Status: None   Collection Time: 01/31/20 11:13 AM   Specimen: Urine, Catheterized  Result Value Ref Range Status   Specimen Description   Final    URINE, CATHETERIZED Performed at [facility], [address], [place], [place] [address]    Special Requests   Final    NONE Performed at [facility], [address], [place], [place] [address]    Culture   Final    NO GROWTH Performed at [facility], [place] [address] [place], [place] [address]    Report Status 02/01/2020 FINAL  Final     Time coordinating discharge: Over 30 minutes  SIGNED:   Mckinley Jewel, MD  [facility] 02/04/2020, 12:00 PM Pager   If 7PM-7AM, please contact night-coverage [email or web address]

## 2020-02-04 NOTE — Progress Notes (Signed)
Physical Therapy Treatment Patient Details Name: Evan [name] MRN: [id number] DOB: 07-May-1940 Today's Date: 02/04/2020    History of Present Illness Evan [name] is a 80 y.o. male with medical history significant for chronic systolic heart failure with most recent LVEF 45 to 50% in November 2021, severe aortic stenosis status post TAVR in April 2019, permanent atrial fibrillation chronically anticoagulated on Eliquis, myelodysplastic syndrome complicated by chronic anemia, type 2 diabetes mellitus, hypertension, stage IIIb chronic kidney disease with baseline creatinine 1.6-2.0, who is admitted to [facility] on 01/26/2020 with acute on chronic systolic heart failure after presenting from home to [facility] Emergency Department complaining of swelling in the bilateral lower extremities.    PT Comments    Pt pleasant but lethargic during the session but put forth fair effort with occasional cues/encouragement for increased engagement.  Pt was able to come to standing and to take several steps this session with SpO2 on 3LO2/min for the first ambulation session with pt's SpO2 noted to drop to a low of 84% from a baseline of 97%. Pt education provided on PLB technique in sitting and SpO2 increased back to >/=92% in 30 sec.  Nursing called with this PT instructed to try pt on 4LO2/min.  During second amb session on 4LO2/min pt's SpO2 remained >/= 95% throughout, nsg notified.  Pt will benefit from PT services in a SNF setting upon discharge to safely address deficits listed in patient problem list for decreased caregiver assistance and eventual return to PLOF.   Follow Up Recommendations  SNF     Equipment Recommendations  Rolling walker with 5" wheels;3in1 (PT)    Recommendations for Other Services       Precautions / Restrictions Precautions Precautions: Fall Restrictions Weight Bearing Restrictions: No    Mobility  Bed Mobility Overal bed mobility: Needs Assistance Bed  Mobility: Supine to Sit;Sit to Supine;Rolling Rolling: Min assist   Supine to sit: +2 for physical assistance;Min assist;Mod assist Sit to supine: +2 for physical assistance;Min assist;Mod assist   General bed mobility comments: +2 min to mod A for BLE and trunk control  Transfers Overall transfer level: Needs assistance Equipment used: Rolling walker (2 wheeled) Transfers: Sit to/from Stand Sit to Stand: +2 physical assistance;From elevated surface;Min assist         General transfer comment: Mod verbal cues for sequencing with pt requiring +2 Min A to come to full upright standing position from an elevated surface  Ambulation/Gait Ambulation/Gait assistance: Min guard;+2 safety/equipment Gait Distance (Feet): 3 Feet x 2 Assistive device: Rolling walker (2 wheeled) Gait Pattern/deviations: Step-to pattern;Trunk flexed Gait velocity: decreased   General Gait Details: Pt able to ambulate 2 x 3' with a RW and +2 CGA for safety; pt fatigued quickly and required to return to sitting but did not require physical assistance for stability   Stairs             Wheelchair Mobility    Modified Rankin (Stroke Patients Only)       Balance Overall balance assessment: Needs assistance Sitting-balance support: Feet supported Sitting balance-Leahy Scale: Good Sitting balance - Comments: no loss of balance in seated position.   Standing balance support: Bilateral upper extremity supported;During functional activity Standing balance-Leahy Scale: Fair Standing balance comment: Mod lean on the RW for support in standing but no LOB or LE buckling noted  Cognition Arousal/Alertness: Lethargic Behavior During Therapy: WFL for tasks assessed/performed Overall Cognitive Status: Difficult to assess                                        Exercises Total Joint Exercises Ankle Circles/Pumps: AROM;Strengthening;Both;10 reps;5  reps Heel Slides: AAROM;Both;5 reps Hip ABduction/ADduction: AROM;Strengthening;Both;10 reps;AAROM;5 reps Straight Leg Raises: AROM;Strengthening;Both;10 reps;AAROM;5 reps Long Arc Quad: AROM;Strengthening;Both;10 reps;Seated;5 reps Knee Flexion: AROM;Strengthening;Seated;Both;10 reps;5 reps Marching in Standing: AROM;Strengthening;Both;5 reps;Standing    General Comments        Pertinent Vitals/Pain Pain Assessment: 0-10 Pain Score: 6  Pain Location: B knees Pain Descriptors / Indicators: Aching Pain Intervention(s): Premedicated before session;Monitored during session    Home Living                      Prior Function            PT Goals (current goals can now be found in the care plan section) Progress towards PT goals: Progressing toward goals    Frequency    Min 2X/week      PT Plan Current plan remains appropriate    Co-evaluation              AM-PAC PT "6 Clicks" Mobility   Outcome Measure  Help needed turning from your back to your side while in a flat bed without using bedrails?: A Little Help needed moving from lying on your back to sitting on the side of a flat bed without using bedrails?: A Lot Help needed moving to and from a bed to a chair (including a wheelchair)?: A Lot Help needed standing up from a chair using your arms (e.g., wheelchair or bedside chair)?: A Lot Help needed to walk in hospital room?: Total Help needed climbing 3-5 steps with a railing? : Total 6 Click Score: 11    End of Session Equipment Utilized During Treatment: Oxygen;Gait belt Activity Tolerance: Patient tolerated treatment well Patient left: in bed;with call bell/phone within reach;with bed alarm set;with family/visitor present Nurse Communication: Mobility status PT Visit Diagnosis: Unsteadiness on feet (R26.81);Other abnormalities of gait and mobility (R26.89);Muscle weakness (generalized) (M62.81);Pain Pain - Right/Left:  (Bilateral knees) Pain -  part of body: Knee     Time: 1540-0867 PT Time Calculation (min) (ACUTE ONLY): 39 min  Charges:  $Gait Training: 8-22 mins $Therapeutic Exercise: 8-22 mins $Therapeutic Activity: 8-22 mins                     D. Scott Dalilah Curlin PT, DPT 02/04/20, 1:03 PM

## 2020-02-04 NOTE — Progress Notes (Signed)
Evan [name]   DOB:01/27/1940   DJ#:[id number]    Subjective: Patient resting in the bed.  No acute distress.  Sleeping.  Wife by the bedside.  Objective:  Vitals:   02/03/20 2323 02/04/20 0440  BP: (!) 145/60 140/74  Pulse: 67 73  Resp: 14 18  Temp: 97.6 F (36.4 C) 97.8 F (36.6 C)  SpO2: 99% 100%     Intake/Output Summary (Last 24 hours) at 02/04/2020 0741 Last data filed at 02/04/2020 0400 Gross per 24 hour  Intake 360 ml  Output 800 ml  Net -440 ml    Physical Exam Constitutional:      Comments: Elderly male patient resting in the bed.  Accompanied by his wife.  Sleeping.  Easily arousable.  HENT:     Head: Normocephalic and atraumatic.     Mouth/Throat:     Mouth: Oropharynx is clear and moist.     Pharynx: No oropharyngeal exudate.  Eyes:     Pupils: Pupils are equal, round, and reactive to light.  Cardiovascular:     Rate and Rhythm: Normal rate and regular rhythm.  Pulmonary:     Effort: No respiratory distress.     Breath sounds: No wheezing.     Comments: Decreased air entry bilaterally. Abdominal:     General: Bowel sounds are normal. There is no distension.     Palpations: Abdomen is soft. There is no mass.     Tenderness: There is no abdominal tenderness. There is no guarding or rebound.  Musculoskeletal:        General: No tenderness or edema. Normal range of motion.     Cervical back: Normal range of motion and neck supple.  Skin:    General: Skin is warm.  Neurological:     Mental Status: He is oriented to person, place, and time.  Psychiatric:        Mood and Affect: Affect normal.      Labs:  Lab Results  Component Value Date   WBC 3.2 (L) 02/04/2020   HGB 8.1 (L) 02/04/2020   HCT 26.2 (L) 02/04/2020   MCV 102.7 (H) 02/04/2020   PLT 69 (L) 02/04/2020   NEUTROABS 3.6 01/29/2020    Lab Results  Component Value Date   NA 145 02/03/2020   K 3.5 02/03/2020   CL 96 (L) 02/03/2020   CO2 36 (H) 02/03/2020    Studies:  No results  found.  MDS (myelodysplastic syndrome), low grade ([place]) 80 year old male patient with multiple medical problems including congestive heart failure/chronic respiratory failure A. fib on anticoagulation-also low-grade MDS 5q deletion currently admitted to hospital for acute on chronic congestive heart failure/acute gout.  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 Deletion-currently off Revlimid [given thrombocytopenia 40s]; currently platelets 60s.  Hemoglobin 8.3.  Continue to hold Revlimid because of hematologic toxicity.  Plan Retacrit to keep hemoglobin above 8.  #Acute on chronic congestive heart failure-status post diuresis; improvement in respiratory status noted.  A. Fib-currently off Eliquis [given thrombocytopenia/anemia]-I think it is reasonable to start Eliquis as platelets are steadily increasing; and anemia is not from blood loss.  #Acute gout-elevated uric acid; on prednisone-symptomatic improvement noted.  #Discussed with the patient's wife-that the patient's prognosis is mainly determined by his congestive heart failure/chronic respiratory failure-where in MDS also complicates the clinical scenario.  Strongly recommend DNR/DNI.   Cammie Sickle, MD 02/04/2020  7:41 AM

## 2020-02-06 ENCOUNTER — Ambulatory Visit: Payer: Medicare Other | Admitting: Family

## 2020-02-10 ENCOUNTER — Telehealth: Payer: Self-pay | Admitting: *Deleted

## 2020-02-10 ENCOUNTER — Telehealth: Payer: Self-pay | Admitting: Cardiology

## 2020-02-10 ENCOUNTER — Inpatient Hospital Stay: Payer: Medicare Other

## 2020-02-10 ENCOUNTER — Inpatient Hospital Stay: Payer: Medicare Other | Attending: Internal Medicine

## 2020-02-10 DIAGNOSIS — Z79899 Other long term (current) drug therapy: Secondary | ICD-10-CM

## 2020-02-10 NOTE — Telephone Encounter (Signed)
Returned call to patient's wife (Dr. [name]). Patient has swelling in feet/legs, shortness of breath. She reports symptoms still persist. He was in hospital at [facility] for 9 days, discharged on 1/11. His diet has not changed, his appetite is not that good at this point. Wife reports he has to stay propped up to sleep, wife states he does not like to elevate legs. She states patient told her he can't breathe well when his feet are up. He is on lasix 80mg  QAM and 40mg  QPM. He had noted AKI on top of CKD while in hospital. Will send message to primary cardiologist to advise

## 2020-02-10 NOTE — Telephone Encounter (Signed)
Pt c/o swelling: STAT is pt has developed SOB within 24 hours  1) How much weight have you gained and in what time span? Wife is not sure  2) If swelling, where is the swelling located? Feet and legs  3) Are you currently taking a fluid pill? Yes   4) Are you currently SOB?   5) Do you have a log of your daily weights (if so, list)? no  6) Have you gained 3 pounds in a day or 5 pounds in a week? Wife is not sure  7) Have you traveled recently? No   Wife said patient was just in the hospital for Gout in his feet. He is unable to stand to be weighed, but his feet and ankles are pretty swollen. The wife wanted to know how to adjust his fluid pill. Wife had to take another call before I could ask if patient was SOB

## 2020-02-10 NOTE — Telephone Encounter (Signed)
Spoke with wife about med change. Med list updated. BMET ordered She has difficulties getting patient out of the house, but he has a visit at the cancer center next week She will have them do his BMET then

## 2020-02-10 NOTE — Telephone Encounter (Signed)
I returned the patient's wife's phone call. She states that patient is unable to get out of bed due to flare in gout. He can't even walk. He would require non emergent transport via ems. The patient still doesn't have a ramp built outside the residence and until they do, she is unable to get the patient out of the door. Wife believes that patient may need a blood transfusion. I explained to her that we do not have any availability to transfuse the patient today. However, we will still need labs to determine if he needs to be transfused. Wife stated that until the ramp is built at the home and the icy roads, clear up. She doesn't think she can even get her husband to the cancer center "at all this week. Wife declined any apts this week. Pt has scheduled apts next Tuesday with Dr. Jacinto Reap. She elected to keep this as scheduled and will call our office back if she changes her mind. I offered multiple times for the patient to r/s, but wife declined apts this week.  Colette- Could you see if there is a chair for next Tuesday for possible blood transfusion when patient comes to his apts.

## 2020-02-10 NOTE — Telephone Encounter (Signed)
Evan [name], wife called reporting that due to the roads, she needs to cancel his appointment today, She has opted not to speak with scheduler wanting to speak with Dr B she was asking if patient could get a blood transfusion today if she were able to get him in today and I explained that most likely not with it being too late. Please call her back to discuss her concerns [phone]

## 2020-02-10 NOTE — Telephone Encounter (Signed)
I would increase lasix to 80 mg bid. Should repeat BMET in one week  Del Wiseman [name] MD, [facility]

## 2020-02-11 ENCOUNTER — Telehealth: Payer: Self-pay | Admitting: Internal Medicine

## 2020-02-11 NOTE — Telephone Encounter (Signed)
On 1/17-I called patient's wife regarding her concerns of inability to come to the cancer center for blood transfusion/blood work.  Patient needing wheelchair for ambulation/because of gout.  Wife requests information on transportation.   H- any thoughts on transportation.  GB

## 2020-02-14 ENCOUNTER — Telehealth: Payer: Self-pay | Admitting: Nurse Practitioner

## 2020-02-14 ENCOUNTER — Other Ambulatory Visit: Payer: Self-pay

## 2020-02-14 DIAGNOSIS — D4620 Refractory anemia with excess of blasts, unspecified: Secondary | ICD-10-CM

## 2020-02-14 NOTE — Telephone Encounter (Signed)
Follow-up call made and spoke with patient's wife to see if she had heard anything from [facility] and she said that they came out and admitted patient yesterday.  I will cancel the Palliative referral and notify referral MD.

## 2020-02-15 ENCOUNTER — Emergency Department: Payer: Medicare Other

## 2020-02-15 ENCOUNTER — Inpatient Hospital Stay
Admission: EM | Admit: 2020-02-15 | Discharge: 2020-03-24 | DRG: 870 | Disposition: E | Payer: Medicare Other | Attending: Family Medicine | Admitting: Family Medicine

## 2020-02-15 ENCOUNTER — Encounter: Payer: Self-pay | Admitting: Emergency Medicine

## 2020-02-15 ENCOUNTER — Other Ambulatory Visit: Payer: Self-pay

## 2020-02-15 ENCOUNTER — Telehealth: Payer: Self-pay | Admitting: Student

## 2020-02-15 DIAGNOSIS — D649 Anemia, unspecified: Secondary | ICD-10-CM | POA: Diagnosis not present

## 2020-02-15 DIAGNOSIS — Z7901 Long term (current) use of anticoagulants: Secondary | ICD-10-CM

## 2020-02-15 DIAGNOSIS — I5022 Chronic systolic (congestive) heart failure: Secondary | ICD-10-CM | POA: Diagnosis not present

## 2020-02-15 DIAGNOSIS — E119 Type 2 diabetes mellitus without complications: Secondary | ICD-10-CM

## 2020-02-15 DIAGNOSIS — Z820 Family history of epilepsy and other diseases of the nervous system: Secondary | ICD-10-CM

## 2020-02-15 DIAGNOSIS — E872 Acidosis, unspecified: Secondary | ICD-10-CM

## 2020-02-15 DIAGNOSIS — D469 Myelodysplastic syndrome, unspecified: Secondary | ICD-10-CM

## 2020-02-15 DIAGNOSIS — R4182 Altered mental status, unspecified: Secondary | ICD-10-CM

## 2020-02-15 DIAGNOSIS — B952 Enterococcus as the cause of diseases classified elsewhere: Secondary | ICD-10-CM

## 2020-02-15 DIAGNOSIS — A4189 Other specified sepsis: Principal | ICD-10-CM | POA: Diagnosis present

## 2020-02-15 DIAGNOSIS — Z951 Presence of aortocoronary bypass graft: Secondary | ICD-10-CM

## 2020-02-15 DIAGNOSIS — M109 Gout, unspecified: Secondary | ICD-10-CM | POA: Diagnosis present

## 2020-02-15 DIAGNOSIS — I4821 Permanent atrial fibrillation: Secondary | ICD-10-CM | POA: Diagnosis present

## 2020-02-15 DIAGNOSIS — Z950 Presence of cardiac pacemaker: Secondary | ICD-10-CM

## 2020-02-15 DIAGNOSIS — R6521 Severe sepsis with septic shock: Secondary | ICD-10-CM | POA: Diagnosis present

## 2020-02-15 DIAGNOSIS — E1122 Type 2 diabetes mellitus with diabetic chronic kidney disease: Secondary | ICD-10-CM | POA: Diagnosis present

## 2020-02-15 DIAGNOSIS — Z515 Encounter for palliative care: Secondary | ICD-10-CM

## 2020-02-15 DIAGNOSIS — D696 Thrombocytopenia, unspecified: Secondary | ICD-10-CM

## 2020-02-15 DIAGNOSIS — Z8673 Personal history of transient ischemic attack (TIA), and cerebral infarction without residual deficits: Secondary | ICD-10-CM

## 2020-02-15 DIAGNOSIS — Z7189 Other specified counseling: Secondary | ICD-10-CM | POA: Diagnosis not present

## 2020-02-15 DIAGNOSIS — Z0189 Encounter for other specified special examinations: Secondary | ICD-10-CM

## 2020-02-15 DIAGNOSIS — E785 Hyperlipidemia, unspecified: Secondary | ICD-10-CM | POA: Diagnosis present

## 2020-02-15 DIAGNOSIS — I429 Cardiomyopathy, unspecified: Secondary | ICD-10-CM | POA: Diagnosis present

## 2020-02-15 DIAGNOSIS — E870 Hyperosmolality and hypernatremia: Secondary | ICD-10-CM

## 2020-02-15 DIAGNOSIS — I272 Pulmonary hypertension, unspecified: Secondary | ICD-10-CM | POA: Diagnosis present

## 2020-02-15 DIAGNOSIS — K7460 Unspecified cirrhosis of liver: Secondary | ICD-10-CM | POA: Diagnosis present

## 2020-02-15 DIAGNOSIS — R54 Age-related physical debility: Secondary | ICD-10-CM | POA: Diagnosis present

## 2020-02-15 DIAGNOSIS — A4181 Sepsis due to Enterococcus: Secondary | ICD-10-CM | POA: Diagnosis present

## 2020-02-15 DIAGNOSIS — N1832 Chronic kidney disease, stage 3b: Secondary | ICD-10-CM | POA: Diagnosis present

## 2020-02-15 DIAGNOSIS — I2510 Atherosclerotic heart disease of native coronary artery without angina pectoris: Secondary | ICD-10-CM | POA: Diagnosis present

## 2020-02-15 DIAGNOSIS — Z953 Presence of xenogenic heart valve: Secondary | ICD-10-CM

## 2020-02-15 DIAGNOSIS — I25708 Atherosclerosis of coronary artery bypass graft(s), unspecified, with other forms of angina pectoris: Secondary | ICD-10-CM | POA: Diagnosis not present

## 2020-02-15 DIAGNOSIS — G9340 Encephalopathy, unspecified: Secondary | ICD-10-CM | POA: Diagnosis not present

## 2020-02-15 DIAGNOSIS — J9601 Acute respiratory failure with hypoxia: Secondary | ICD-10-CM | POA: Diagnosis not present

## 2020-02-15 DIAGNOSIS — Z66 Do not resuscitate: Secondary | ICD-10-CM | POA: Diagnosis not present

## 2020-02-15 DIAGNOSIS — I248 Other forms of acute ischemic heart disease: Secondary | ICD-10-CM | POA: Diagnosis present

## 2020-02-15 DIAGNOSIS — J80 Acute respiratory distress syndrome: Secondary | ICD-10-CM | POA: Diagnosis present

## 2020-02-15 DIAGNOSIS — Z888 Allergy status to other drugs, medicaments and biological substances status: Secondary | ICD-10-CM

## 2020-02-15 DIAGNOSIS — Z952 Presence of prosthetic heart valve: Secondary | ICD-10-CM

## 2020-02-15 DIAGNOSIS — U071 COVID-19: Secondary | ICD-10-CM

## 2020-02-15 DIAGNOSIS — J9600 Acute respiratory failure, unspecified whether with hypoxia or hypercapnia: Secondary | ICD-10-CM | POA: Diagnosis present

## 2020-02-15 DIAGNOSIS — I130 Hypertensive heart and chronic kidney disease with heart failure and stage 1 through stage 4 chronic kidney disease, or unspecified chronic kidney disease: Secondary | ICD-10-CM | POA: Diagnosis present

## 2020-02-15 DIAGNOSIS — K921 Melena: Secondary | ICD-10-CM | POA: Diagnosis present

## 2020-02-15 DIAGNOSIS — R531 Weakness: Secondary | ICD-10-CM

## 2020-02-15 DIAGNOSIS — K717 Toxic liver disease with fibrosis and cirrhosis of liver: Secondary | ICD-10-CM | POA: Diagnosis not present

## 2020-02-15 DIAGNOSIS — L89151 Pressure ulcer of sacral region, stage 1: Secondary | ICD-10-CM | POA: Diagnosis present

## 2020-02-15 DIAGNOSIS — N183 Chronic kidney disease, stage 3 unspecified: Secondary | ICD-10-CM | POA: Diagnosis present

## 2020-02-15 DIAGNOSIS — I5043 Acute on chronic combined systolic (congestive) and diastolic (congestive) heart failure: Secondary | ICD-10-CM | POA: Diagnosis present

## 2020-02-15 DIAGNOSIS — Z79899 Other long term (current) drug therapy: Secondary | ICD-10-CM

## 2020-02-15 DIAGNOSIS — R7881 Bacteremia: Secondary | ICD-10-CM

## 2020-02-15 DIAGNOSIS — I10 Essential (primary) hypertension: Secondary | ICD-10-CM | POA: Diagnosis present

## 2020-02-15 DIAGNOSIS — D6859 Other primary thrombophilia: Secondary | ICD-10-CM | POA: Diagnosis present

## 2020-02-15 DIAGNOSIS — K7469 Other cirrhosis of liver: Secondary | ICD-10-CM | POA: Diagnosis not present

## 2020-02-15 DIAGNOSIS — N189 Chronic kidney disease, unspecified: Secondary | ICD-10-CM | POA: Diagnosis not present

## 2020-02-15 DIAGNOSIS — G9341 Metabolic encephalopathy: Secondary | ICD-10-CM

## 2020-02-15 DIAGNOSIS — Z87891 Personal history of nicotine dependence: Secondary | ICD-10-CM

## 2020-02-15 DIAGNOSIS — I4891 Unspecified atrial fibrillation: Secondary | ICD-10-CM | POA: Diagnosis not present

## 2020-02-15 DIAGNOSIS — E876 Hypokalemia: Secondary | ICD-10-CM

## 2020-02-15 DIAGNOSIS — Z6833 Body mass index (BMI) 33.0-33.9, adult: Secondary | ICD-10-CM

## 2020-02-15 DIAGNOSIS — D61818 Other pancytopenia: Secondary | ICD-10-CM | POA: Diagnosis not present

## 2020-02-15 DIAGNOSIS — D4620 Refractory anemia with excess of blasts, unspecified: Secondary | ICD-10-CM | POA: Diagnosis present

## 2020-02-15 DIAGNOSIS — J1282 Pneumonia due to coronavirus disease 2019: Secondary | ICD-10-CM | POA: Diagnosis present

## 2020-02-15 DIAGNOSIS — E1159 Type 2 diabetes mellitus with other circulatory complications: Secondary | ICD-10-CM | POA: Diagnosis not present

## 2020-02-15 DIAGNOSIS — N179 Acute kidney failure, unspecified: Secondary | ICD-10-CM

## 2020-02-15 DIAGNOSIS — C946 Myelodysplastic disease, not classified: Secondary | ICD-10-CM | POA: Diagnosis not present

## 2020-02-15 DIAGNOSIS — E7220 Disorder of urea cycle metabolism, unspecified: Secondary | ICD-10-CM

## 2020-02-15 DIAGNOSIS — A419 Sepsis, unspecified organism: Secondary | ICD-10-CM | POA: Diagnosis present

## 2020-02-15 DIAGNOSIS — K922 Gastrointestinal hemorrhage, unspecified: Secondary | ICD-10-CM | POA: Diagnosis not present

## 2020-02-15 DIAGNOSIS — R7989 Other specified abnormal findings of blood chemistry: Secondary | ICD-10-CM

## 2020-02-15 DIAGNOSIS — M1711 Unilateral primary osteoarthritis, right knee: Secondary | ICD-10-CM | POA: Diagnosis present

## 2020-02-15 DIAGNOSIS — R570 Cardiogenic shock: Secondary | ICD-10-CM | POA: Diagnosis not present

## 2020-02-15 DIAGNOSIS — K5900 Constipation, unspecified: Secondary | ICD-10-CM | POA: Diagnosis present

## 2020-02-15 DIAGNOSIS — R1312 Dysphagia, oropharyngeal phase: Secondary | ICD-10-CM | POA: Diagnosis present

## 2020-02-15 DIAGNOSIS — Z8249 Family history of ischemic heart disease and other diseases of the circulatory system: Secondary | ICD-10-CM

## 2020-02-15 DIAGNOSIS — K7030 Alcoholic cirrhosis of liver without ascites: Secondary | ICD-10-CM | POA: Diagnosis not present

## 2020-02-15 DIAGNOSIS — L8990 Pressure ulcer of unspecified site, unspecified stage: Secondary | ICD-10-CM | POA: Insufficient documentation

## 2020-02-15 DIAGNOSIS — R778 Other specified abnormalities of plasma proteins: Secondary | ICD-10-CM

## 2020-02-15 DIAGNOSIS — Z978 Presence of other specified devices: Secondary | ICD-10-CM

## 2020-02-15 LAB — COMPREHENSIVE METABOLIC PANEL
ALT: 25 U/L (ref 0–44)
AST: 32 U/L (ref 15–41)
Albumin: 2.8 g/dL — ABNORMAL LOW (ref 3.5–5.0)
Alkaline Phosphatase: 107 U/L (ref 38–126)
Anion gap: 18 — ABNORMAL HIGH (ref 5–15)
BUN: 69 mg/dL — ABNORMAL HIGH (ref 8–23)
CO2: 34 mmol/L — ABNORMAL HIGH (ref 22–32)
Calcium: 8.7 mg/dL — ABNORMAL LOW (ref 8.9–10.3)
Chloride: 101 mmol/L (ref 98–111)
Creatinine, Ser: 1.7 mg/dL — ABNORMAL HIGH (ref 0.61–1.24)
GFR, Estimated: 41 mL/min — ABNORMAL LOW (ref >60)
Glucose, Bld: 213 mg/dL — ABNORMAL HIGH (ref 70–99)
Potassium: 3.6 mmol/L (ref 3.5–5.1)
Sodium: 153 mmol/L — ABNORMAL HIGH (ref 135–145)
Total Bilirubin: 4.2 mg/dL — ABNORMAL HIGH (ref 0.3–1.2)
Total Protein: 6.5 g/dL (ref 6.5–8.1)

## 2020-02-15 LAB — CBC WITH DIFFERENTIAL/PLATELET
Abs Immature Granulocytes: 0.06 10*3/uL (ref 0.00–0.07)
Basophils Absolute: 0 10*3/uL (ref 0.0–0.1)
Basophils Relative: 0 %
Eosinophils Absolute: 0 10*3/uL (ref 0.0–0.5)
Eosinophils Relative: 0 %
HCT: 18.9 % — ABNORMAL LOW (ref 39.0–52.0)
Hemoglobin: 5.7 g/dL — ABNORMAL LOW (ref 13.0–17.0)
Immature Granulocytes: 2 %
Lymphocytes Relative: 11 %
Lymphs Abs: 0.4 10*3/uL — ABNORMAL LOW (ref 0.7–4.0)
MCH: 32.2 pg (ref 26.0–34.0)
MCHC: 30.2 g/dL (ref 30.0–36.0)
MCV: 106.8 fL — ABNORMAL HIGH (ref 80.0–100.0)
Monocytes Absolute: 0.1 10*3/uL (ref 0.1–1.0)
Monocytes Relative: 3 %
Neutro Abs: 2.6 10*3/uL (ref 1.7–7.7)
Neutrophils Relative %: 84 %
Platelets: 46 10*3/uL — ABNORMAL LOW (ref 150–400)
RBC: 1.77 MIL/uL — ABNORMAL LOW (ref 4.22–5.81)
RDW: 21.5 % — ABNORMAL HIGH (ref 11.5–15.5)
Smear Review: NORMAL
WBC: 3.2 10*3/uL — ABNORMAL LOW (ref 4.0–10.5)
nRBC: 1.9 % — ABNORMAL HIGH (ref 0.0–0.2)

## 2020-02-15 LAB — AMMONIA: Ammonia: 38 umol/L — ABNORMAL HIGH (ref 9–35)

## 2020-02-15 LAB — MAGNESIUM: Magnesium: 2.6 mg/dL — ABNORMAL HIGH (ref 1.7–2.4)

## 2020-02-15 LAB — BRAIN NATRIURETIC PEPTIDE: B Natriuretic Peptide: 1999.6 pg/mL — ABNORMAL HIGH (ref 0.0–100.0)

## 2020-02-15 LAB — TROPONIN I (HIGH SENSITIVITY): Troponin I (High Sensitivity): 210 ng/L (ref <18)

## 2020-02-15 LAB — SARS CORONAVIRUS 2 BY RT PCR (HOSPITAL ORDER, PERFORMED IN ~~LOC~~ HOSPITAL LAB): SARS Coronavirus 2: POSITIVE — AB

## 2020-02-15 LAB — CBG MONITORING, ED: Glucose-Capillary: 195 mg/dL — ABNORMAL HIGH (ref 70–99)

## 2020-02-15 LAB — PREPARE RBC (CROSSMATCH)

## 2020-02-15 LAB — LACTIC ACID, PLASMA: Lactic Acid, Venous: 2.1 mmol/L (ref 0.5–1.9)

## 2020-02-15 MED ORDER — TAMSULOSIN HCL 0.4 MG PO CAPS
0.4000 mg | ORAL_CAPSULE | Freq: Every day | ORAL | Status: DC
  Administered 2020-02-22 – 2020-02-25 (×4): 0.4 mg via ORAL
  Filled 2020-02-15 (×6): qty 1

## 2020-02-15 MED ORDER — INSULIN ASPART 100 UNIT/ML ~~LOC~~ SOLN: 0.0000 [IU] | Freq: Three times a day (TID) | SUBCUTANEOUS | Status: DC

## 2020-02-15 MED ORDER — ACETAMINOPHEN 650 MG RE SUPP: 650.0000 mg | Freq: Four times a day (QID) | RECTAL | Status: DC | PRN

## 2020-02-15 MED ORDER — ALLOPURINOL 100 MG PO TABS
100.0000 mg | ORAL_TABLET | Freq: Every day | ORAL | Status: DC
  Administered 2020-02-21 – 2020-02-25 (×5): 100 mg via ORAL
  Filled 2020-02-15 (×10): qty 1

## 2020-02-15 MED ORDER — ACETAMINOPHEN 325 MG PO TABS: 650.0000 mg | ORAL_TABLET | Freq: Four times a day (QID) | ORAL | Status: DC | PRN

## 2020-02-15 MED ORDER — SODIUM CHLORIDE 0.9 % IV SOLN: 10.0000 mL/h | Freq: Once | INTRAVENOUS | Status: DC

## 2020-02-15 MED ORDER — INSULIN ASPART 100 UNIT/ML ~~LOC~~ SOLN: 0.0000 [IU] | Freq: Every day | SUBCUTANEOUS | Status: DC

## 2020-02-15 MED ORDER — DOCUSATE SODIUM 100 MG PO CAPS
100.0000 mg | ORAL_CAPSULE | Freq: Every day | ORAL | Status: DC
  Filled 2020-02-15: qty 1

## 2020-02-15 MED ORDER — PANTOPRAZOLE SODIUM 40 MG PO TBEC
40.0000 mg | DELAYED_RELEASE_TABLET | Freq: Every day | ORAL | Status: DC
Start: 2020-02-15 — End: 2020-02-16

## 2020-02-15 MED ORDER — AMLODIPINE BESYLATE 5 MG PO TABS: 2.5000 mg | ORAL_TABLET | Freq: Every day | ORAL | Status: DC

## 2020-02-15 MED ORDER — ONDANSETRON HCL 4 MG/2ML IJ SOLN
4.0000 mg | Freq: Four times a day (QID) | INTRAMUSCULAR | Status: DC | PRN
  Administered 2020-02-17: 4 mg via INTRAVENOUS
  Filled 2020-02-15: qty 2

## 2020-02-15 MED ORDER — ONDANSETRON HCL 4 MG PO TABS: 4.0000 mg | ORAL_TABLET | Freq: Four times a day (QID) | ORAL | Status: DC | PRN

## 2020-02-15 MED ORDER — FUROSEMIDE 40 MG PO TABS: 40.0000 mg | ORAL_TABLET | Freq: Two times a day (BID) | ORAL | Status: DC

## 2020-02-15 MED ORDER — SODIUM CHLORIDE 0.9% IV SOLUTION
Freq: Once | INTRAVENOUS | Status: AC
  Filled 2020-02-15: qty 250

## 2020-02-15 NOTE — ED Triage Notes (Signed)
Arrived via [facility]. Pt with AMS for the last few days. Pt not eating or drinking. 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 Recently dcd from here week ago. Bs from ems 243. Pt with pace maker. Paced rhythm.

## 2020-02-15 NOTE — ED Notes (Signed)
Pt tolerating blood transfusion.

## 2020-02-15 NOTE — ED Notes (Signed)
Patient transported to CT 

## 2020-02-15 NOTE — Telephone Encounter (Signed)
   Patient's wife called Answering Service today with concerns of decreased PO  intake.  Called and spoke with wife.  She notes that he has had a decreased appetite for about 3 weeks; however, for the last 3 days he has not eaten anything and is only taken a couple sips of water/ginger ale.  Wife is worried about him being on Lasix 80 mg twice daily if he is not eating or drinking very much.  He has also had progressive weakness.  He is on home O2 and wife has had to increase his O2 level recently to from 3L/min to 5L/min.  She reports she has some shortness of breath and difficulty laying flat.  She also notes that he had chest discomfort the other day that seem to improve when his O2 was increased.  Discussed with patient's wife that this is a difficult situation.  I do worry about his renal function with him being on this dose of Lasix with poor PO intake.  However, I also worry that his  respiratory status will decline if I decrease his Lasix.  It sounds like he is failure to thrive at this point.  I think the best course of action would for him to be evaluated in the ED and labs to be drawn.  Wife is very understanding and states she will call EMS.  Of note, patient is following with palliative care and wife states she was going to call them as well.  Darreld Mclean, PA-C 06-Mar-2020 4:15 PM

## 2020-02-15 NOTE — ED Notes (Signed)
MD notified about critical lactic acid and troponin.

## 2020-02-15 NOTE — H&P (Addendum)
History and Physical    Evan [name] XIP:[id number] DOB: [birth year] DOA: 01/29/2020  PCP: Evan Ramsay, MD   Patient coming from: home  I have personally briefly reviewed patient's old medical records in [place]  Chief Complaint: Weakness  HPI: Evan [name] is a 81 y.o. 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  He has had a mild cough that is chronic but no fever or shortness of breath.  No complaints of chest pain and no abdominal pain, vomiting or diarrhea.  Most of the history is given by the wife at the bedside who states that he was doing well following his discharge on 1/11 but 5 days ago started having a decline.  She reports that patient has improved from his CHF with decrease in lower extremity edema but it recently got worse and his doctor increased the dose of lasix. His gout pain improved since his recent hospitalization. Wife does report that patient had a few bloody bowel movements after receiving and taking Dulcolax for constipation.   ED Course: On arrival in the emergency room, he was afebrile with blood pressure 119/76 and pulse of 66 but with tachypnea of 22-24.  Blood work notable for hemoglobin of 5.7, down from 8.1 just 11 days prior, platelet count 46K down from 69K, sodium 153, creatinine 1.77 up from baseline of 1.16.  Ammonia level 38, troponin 210, BNP 2000.  COVID PCR positive. EKG not done Chest x-ray: Mild congestive heart failure without significant  change since previous on 01/30/2020 CT head No acute intracranial abnormalities  Patient was started on transfusion of 2 units PRBCs.  Hospitalist consulted for admission.  No bloody or black stool.   Review of Systems: Unable to obtain due to lethargy  Past Medical History:  Diagnosis Date  . Anemia   . Carotid arterial disease ([place])   . CHF (congestive heart failure) ([place])   . Coronary artery disease   . Hearing loss   . HOH (hard of hearing)   . Hyperlipidemia   . Hypertension   . Nocturia   . Obesity   . Osteoarthritis    "BUE; BLE; right knee" (03/23/2017)  . Peripheral vision loss, bilateral    S/P 07/2015  . Permanent atrial fibrillation ([place])   . 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 Severe aortic stenosis   . Sinus drainage   . Stroke [facility]) 07/2015   "peripheral vision still bad out of left eye" (03/23/2017)  . Torn rotator cuff 2012   Right arm  . Type II diabetes mellitus ([place])   . Urinary frequency     Past Surgical History:  Procedure Laterality Date  . BACK SURGERY    . CARDIAC CATHETERIZATION  11/29/2005   SEVERE THREE VESSEL OBSTRUCTIVE ATHERSCLEROTIC CAD. ALL GRAFTS WERE PATENT. EF 65%  . CARDIAC CATHETERIZATION  03/23/2017  . CAROTID ENDARTERECTOMY Right 01-29-08   cea  . CARPAL TUNNEL RELEASE Right   . COLONOSCOPY N/A 12/10/2019   Procedure: COLONOSCOPY;  Surgeon: Wilford Corner, MD;  Location[facility] END[facility]COPY;  Service: Endoscopy;  Laterality:  N/A;  . CORONARY ARTERY BYPASS GRAFT  03/2003   X3. LIMA GRAFT TO THE LAD, SAPHENOUS VEIN GRAFT TO THE PA, AND A LEFT RADIAL GRAFT TO THEOBTUSE MARGINAL VESSEL  . ENDARTERECTOMY Left 05/28/2012   Procedure: ENDARTERECTOMY CAROTID;  Surgeon: Elam Dutch, MD;  Location: [place];  Service: Vascular;  Laterality: Left;  . ESOPHAGOGASTRODUODENOSCOPY N/A 12/10/2019   Procedure: ESOPHAGOGASTRODUODENOSCOPY (EGD);  Surgeon: Wilford Corner, MD;  Location: [place];  Service: Endoscopy;  Laterality:  N/A;  . HEMOSTASIS CLIP PLACEMENT  12/10/2019   Procedure: HEMOSTASIS CLIP PLACEMENT;  Surgeon: Wilford Corner, MD;  Location: [place] ENDOSCOPY;  Service: Endoscopy;;  . INSERT / REPLACE / REMOVE PACEMAKER  03/23/2017  . [place]; 1984  . MOLE SURGERY     "? face/arms"  . PACEMAKER IMPLANT N/A 03/23/2017   Procedure: PACEMAKER IMPLANT;  Surgeon: Constance Haw, MD;  Location: [place] CV LAB;  Service: Cardiovascular;  Laterality: N/A;  . PATCH ANGIOPLASTY Left 05/28/2012   Procedure: WITH dACRON PATCH ANGIOPLASTY;  Surgeon: Elam Dutch, MD;  Location: [place];  Service: Vascular;  Laterality: Left;  . POLYPECTOMY  12/10/2019   Procedure: POLYPECTOMY;  Surgeon: Wilford Corner, MD;  Location: [facility] ENDOSCOPY;  Service: Endoscopy;;  . RIGHT/LEFT HEART CATH AND CORONARY/GRAFT ANGIOGRAPHY N/A 03/23/2017   Procedure: RIGHT/LEFT HEART CATH AND CORONARY/GRAFT ANGIOGRAPHY;  Surgeon: Sherren Mocha, MD;  Location: [place] CV LAB;  Service: Cardiovascular;  Laterality: N/A;  . TEE WITHOUT CARDIOVERSION N/A 05/09/2017   Procedure: TRANSESOPHAGEAL ECHOCARDIOGRAM (TEE);  Surgeon: Sherren Mocha, MD;  Location: [place];  Service: Open Heart Surgery;  Laterality: N/A;  . TRANSCATHETER AORTIC VALVE REPLACEMENT, TRANSFEMORAL N/A 05/09/2017   Procedure: TRANSCATHETER AORTIC VALVE REPLACEMENT, TRANSFEMORAL using an Edwards 18mm Sapien 3 Aortic Valve;  Surgeon: Sherren Mocha, MD;  Location: [place];  Service: Open Heart Surgery;  Laterality: N/A;     reports that he quit smoking about 26 years ago. His smoking use included cigarettes. He has a 78.00 pack-year smoking history. He has never used smokeless tobacco. He reports that he does not drink alcohol and does not use drugs.  Allergies  Allergen Reactions  . Niacin Other (See Comments) and Rash    Red face and burns.  . Niacin And Related Rash and Other (See Comments)    Red face and burns.  . Metformin And Related Diarrhea  .  Statins Other (See Comments)    Myalgias, weakness    Family History  Problem Relation Age of Onset  . Heart attack Father   . Heart attack Brother   . Parkinsonism Brother   . Cancer Brother   . Arrhythmia Brother       Prior to Admission medications   Medication Sig Start Date End Date Taking? Authorizing Provider  acetaminophen (TYLENOL) 650 MG CR tablet Take 1,300 mg by mouth every 8 (eight) hours as needed for pain.     [provider]  allopurinol (ZYLOPRIM) 100 MG tablet Take 1 tablet (100 mg total) by mouth daily. 02/04/20 02/03/21  Pahwani, Michell Heinrich, MD  amLODipine (NORVASC) 2.5 MG tablet Take 1 tablet (2.5 mg total) by mouth daily. 11/27/19 02/25/20  [name], [name] M, MD  apixaban (ELIQUIS) 5 MG TABS tablet Take 1 tablet (5 mg total) by mouth 2 (two) times daily. 01/09/20 07/07/20  Almyra Deforest, PA  benazepril (LOTENSIN) 40 MG tablet Take 40 mg by mouth daily. Patient not taking: Reported on 01/26/2020 01/20/20   [provider]  cyanocobalamin 1000 MCG tablet Take 1,000 mcg by mouth daily.     [provider]  docusate sodium (COLACE) 100 MG capsule Take 1 capsule (100 mg total) by mouth daily. 02/05/20   Pahwani, Michell Heinrich, MD  ezetimibe (ZETIA) 10 MG tablet TAKE 1 TABLET BY MOUTH EVERY DAY Patient taking differently: Take 10 mg by mouth daily. 01/08/19   [name], [name] M, MD  furosemide (LASIX) 80 MG tablet Take 80 mg by mouth 2 (two) times daily.    [provider]  glimepiride (AMARYL) 2 MG tablet TAKE 1 TABLET (2 MG TOTAL) BY MOUTH DAILY WITH BREAKFAST Patient taking differently: Take 2 mg by mouth daily with breakfast. 01/25/18   [name], [name] M, MD  hydrALAZINE (APRESOLINE) 25 MG tablet Take 1 tablet (25 mg total) by mouth 2 (two) times daily. 06/10/19   [name], [name] M, MD  lenalidomide (REVLIMID) 5 MG capsule Take 1 capsule (5 mg total) by mouth daily. Fanny Dance # [id number]     Date Obtained 01/01/2020 Patient taking differently: Take 5 mg by mouth  daily. [employer] Auth # [id number]     Date Obtained 01/01/2020 01/01/20   Cammie Sickle, MD  loratadine (CLARITIN) 10 MG tablet Take 10 mg by mouth daily.    [provider]  pantoprazole (PROTONIX) 40 MG tablet Take 1 tablet (40 mg total) by mouth daily. 01/14/20   [name], [name] M, MD  polyethylene glycol (MIRALAX / GLYCOLAX) 17 g packet Take 17 g by mouth daily. 02/04/20   Pahwani, Michell Heinrich, MD  Polyvinyl Alcohol-Povidone (REFRESH OP) Place 1 drop into both eyes daily as needed (for dry eyes).     [provider]  potassium chloride SA (KLOR-CON) 20 MEQ tablet Take 1 tablet (20 mEq total) by mouth 2 (two) times daily. 01/08/20   Almyra Deforest, PA  prochlorperazine (COMPAZINE) 10 MG tablet Take 1 tablet (10 mg total) by mouth every 6 (six) hours as needed for nausea or vomiting. 01/06/20   Cammie Sickle, MD  tamsulosin (FLOMAX) 0.4 MG CAPS capsule TAKE 1 CAPSULE BY MOUTH ONCE DAILY Patient taking differently: Take 0.4 mg by mouth daily. 09/21/15   [name], [name] M, MD  traZODone (DESYREL) 50 MG tablet Take 50 mg by mouth at bedtime. Patient not taking: Reported on 01/26/2020 01/17/20   [provider]    Physical Exam: Vitals:   February 24, 2020 1820 Feb 24, 2020 1823 02-24-2020 1830 02/24/2020 2000  BP:  125/63 125/63 119/70  Pulse:  89 89 87  Resp:   (!) 23 (!) 24  Temp:  99 F (37.2 C)    TempSrc:  Oral    SpO2:  100% 100% 96%  Weight: 104 kg     Height: 6' (1.829 m)        Vitals:   February 24, 2020 1820 February 24, 2020 1823 02/24/20 1830 02/24/20 2000  BP:  125/63 125/63 119/70  Pulse:  89 89 87  Resp:   (!) 23 (!) 24  Temp:  99 F (37.2 C)    TempSrc:  Oral    SpO2:  100% 100% 96%  Weight: 104 kg     Height: 6' (1.829 m)         Constitutional:  Chronically ill-appearing male, lethargic, oriented x2.  conversational dyspnea.  Pale in appearance HEENT:      Head: Normocephalic and atraumatic.         Eyes: PERLA, EOMI, Conjunctivae are normal. Sclera is non-icteric.  Mouth/Throat: Mucous membranes are moist.       Neck: Supple with no signs of meningismus. Cardiovascular: Regular rate and rhythm. No murmurs, gallops, or rubs. 2+ symmetrical distal pulses are present . No JVD. No 2+ LE edema Respiratory: Respiratory effort increased.Lungs sounds coarse and with bibasilar rales  gastrointestinal: Soft, non tender, and non distended with positive bowel sounds.  Genitourinary: No CVA tenderness. Musculoskeletal: Nontender with normal range of motion in all extremities. No cyanosis, or erythema of extremities. Neurologic:  Face is symmetric. Moving all extremities. No gross focal neurologic deficits . Skin: Skin is warm, dry.  No rash or ulcers Psychiatric: Patient is very lethargic.   Labs on Admission: I have personally reviewed following labs and imaging studies  CBC: Recent Labs  Lab 02/08/2020 1907  WBC 3.2*  NEUTROABS 2.6  HGB 5.7*  HCT 18.9*  MCV 106.8*  PLT 46*   Basic Metabolic Panel: Recent Labs  Lab 01/25/2020 1907  NA 153*  K 3.6  CL 101  CO2 34*  GLUCOSE 213*  BUN 69*  CREATININE 1.70*  CALCIUM 8.7*  MG 2.6*   GFR: Estimated Creatinine Clearance: 44.7 mL/min (A) (by C-G formula based on SCr of 1.7 mg/dL (H)). Liver Function Tests: Recent Labs  Lab 02/10/2020 1907  AST 32  ALT 25  ALKPHOS 107  BILITOT 4.2*  PROT 6.5  ALBUMIN 2.8*   No results for input(s): LIPASE, AMYLASE in the last 168 hours. Recent Labs  Lab 02/01/2020 1907  AMMONIA 38*   Coagulation Profile: No results for input(s): INR, PROTIME in the last 168 hours. Cardiac Enzymes: No results for input(s): CKTOTAL, CKMB, CKMBINDEX, TROPONINI in the last 168 hours. BNP (last 3 results) Recent Labs    12/06/19 1633  PROBNP 9,423*   HbA1C: No results for input(s): HGBA1C in the last 72 hours. CBG: No results for input(s): GLUCAP in the last 168 hours. Lipid Profile: No results for input(s): CHOL, HDL, LDLCALC, TRIG, CHOLHDL, LDLDIRECT in the last 72  hours. Thyroid Function Tests: No results for input(s): TSH, T4TOTAL, FREET4, T3FREE, THYROIDAB in the last 72 hours. Anemia Panel: No results for input(s): VITAMINB12, FOLATE, FERRITIN, TIBC, IRON, RETICCTPCT in the last 72 hours. Urine analysis:    Component Value Date/Time   COLORURINE YELLOW (A) 01/31/2020 1113   APPEARANCEUR CLEAR (A) 01/31/2020 1113   LABSPEC 1.009 01/31/2020 1113   PHURINE 6.0 01/31/2020 1113   GLUCOSEU NEGATIVE 01/31/2020 1113   HGBUR NEGATIVE 01/31/2020 1113   BILIRUBINUR NEGATIVE 01/31/2020 1113   KETONESUR NEGATIVE 01/31/2020 1113   PROTEINUR NEGATIVE 01/31/2020 1113   UROBILINOGEN 0.2 05/24/2012 1535   NITRITE NEGATIVE 01/31/2020 1113   LEUKOCYTESUR NEGATIVE 01/31/2020 1113    Radiological Exams on Admission: DG Chest 2 View  Result Date: 02/11/2020 CLINICAL DATA:  Altered level of consciousness EXAM: CHEST - 2 VIEW COMPARISON:  01/30/2020 FINDINGS: Frontal and lateral views of the chest demonstrate stable single lead pacemaker. 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 Cardiac silhouette is mildly enlarged but stable. There is persistent central vascular congestion, with patchy left basilar airspace disease and small left effusion. No pneumothorax. No acute bony abnormalities. IMPRESSION: 1. Mild congestive heart failure, without significant change since previous. Electronically Signed   By: Randa Ngo M.D.   On: 02/06/2020 18:58   CT Head Wo Contrast  Result Date: 02/04/2020 CLINICAL DATA:  80 year old male with history of altered mental status. EXAM: CT HEAD WITHOUT CONTRAST TECHNIQUE: Contiguous axial images were obtained from the  base of the skull through the vertex without intravenous contrast. COMPARISON:  Head CT 08/24/2015.  Brain MRI 08/24/2015. 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  No evidence of acute infarction, hemorrhage, hydrocephalus, extra-axial collection or mass lesion/mass effect. Vascular: No hyperdense vessel or unexpected calcification. Skull: Normal. Negative for fracture or focal lesion. Sinuses/Orbits: No acute finding. Other: None. IMPRESSION: 1. No acute intracranial abnormalities. 2. 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 Electronically Signed   By: Vinnie Langton M.D.   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      Symptomatic anemia   MDS (myelodysplastic syndrome), low grade (HCC) - Patient presenting with weakness in the setting of known MDS with hemoglobin 5.7 down from baseline of 8.1 and platelet count 46,000 down from recent of 69,000.  Also mildly leukopenic - Continue transfusion of 2 units packed red blood cells - Posttransfusion H&H - Consider oncology consult in the a.m.  Hematochezia - Wife reports that patient has blood a few times in his stool after taking a laxative for constipation - We will hold Eliquis for now - Patient had upper and lower endoscopy in November.  Upper endoscopy showed candidiasis and lower showed 3 sessile polyps - Continue to monitor H&H - Consider GI evaluation when more stable    Acute metabolic encephalopathy Generalized weakness and physical deconditioning - Patient with confusion, unable to answer questions, unable to assist in  ADLs, developing over the past 5 days - Head CT negative - Suspect related to symptomatic anemia, hyperammonemia +/- associated with history of cirrhosis - Differential also includes COVID encephalopathy in view of positive COVID PCR -Neurologic checks - Fall and aspiration precautions  Acute on chronic hypoxic respiratory failure - Following admission, patient's oxygen level fell to the 80s requiring 4 L O2 via nasal cannula - Patient appears to be on 3 L O2 at home - Possibly related to CHF based on chest x-ray findings, cannot rule out component of COVID - Treatment of each below  Mild acute on chronic systolic CHF (congestive heart failure) (HCC) - BNP 1996, down from 2500 during recent past hospitalization and chest x-ray with mild congestion, unchanged from 1/6, midway through recent admission - Lasix if BP will tolerate - Daily weights with intake and output monitorin  COVID-19 virus infection/possible COVID hypoxia - Possible incidental infection. -Wife denies respiratory tract symptoms, nausea or vomiting or diarrhea but patient noted to have tachypnea and is now hypoxic - Start remdesivir, steroids - Airborne precautions  Elevated troponin   Coronary artery disease - Suspect demand ischemia related to severe anemia and possibly mild acute heart failure.  No complaints of chest pain and EKG is nonacute - Continue to trend to peak -Not currently on beta-blocker, statins, antiplatelets    Acute renal failure superimposed on stage 3a chronic kidney disease - creatinine 1.77 up from 1.66 - Suspect prerenal and related to  poor oral intake - IV hydration - Monitor creatinine and sodium    Hyperammonemia (HCC)   History of cirrhosis - Total bilirubin elevated at 4.2 but normal liver enzymes - Uncertain etiology to hyperammonemia but will continue to monitor      Diabetes mellitus (HCC) - Sliding scale insulin coverage    Essential hypertension - We will hold home  benazepril due to renal function - Continue home hydralazine with BP parameters    Atrial fibrillation, permanent (HCC)   Chronic anticoagulation - Holding Eliquis because unable to rule out GI blood loss at this time    S/P TAVR (transcatheter aortic valve replacement) - No acute issues suspected    Diabetes mellitus type 2, uncomplicated (HCC) - Sliding scale insulin coverage    Gout  - not acutely flared --Continue allopurinol - Narcotic pain control          Prognosis is guarded.  This was discussed with wife at bedside    DVT prophylaxis: SCDs Code Status: full code, discussed with wife Family Communication: Wife at bedside Disposition Plan: Back to previous home environment Consults called: none  Status:At the time of admission, it appears that the appropriate admission status for this patient is INPATIENT. This is judged to be reasonable and necessary in order to provide the required intensity of service to ensure the patient's safety given the presenting symptoms, physical exam findings, and initial radiographic and laboratory data in the context of their  Comorbid conditions.   Patient requires inpatient status due to high intensity of service, high risk for further deterioration and high frequency of surveillance required.   I certify that at the point of admission it is my clinical judgment that the patient will require inpatient hospital care spanning beyond [place] MD [facility]     02/01/2020, 9:54 PM

## 2020-02-15 NOTE — ED Provider Notes (Signed)
[facility] Emergency Department Provider Note  ____________________________________________  Time seen: Approximately 6:24 PM  I have reviewed the triage vital signs and the nursing notes.   HISTORY  Chief Complaint Altered Mental Status  Level 5 caveat: Patient is able to answer yes/no questions but does not further elaborate.  Patient's wife is the primary history giver  HPI Evan [name] is a 80 y.o. male who presents the emergency department via EMS from home for complaint of increased altered mental status, weakness, decreased intake of fluids and solids.  Patient was admitted to the hospital  on 01/26/2020.  Patient was discharged on 01/11.  Patient been admitted for CHF, weakness to the point of being unable to stand or ambulate, and gout.  Patient had been admitted for 9 days.  Per the wife, patient had been weak when his discharge but had been relatively his normal self.  Patient's wife is the primary historian states that over the past week the patient has become increasingly weak, is having decreased intake of solids and liquids.  The patient's wife states that the peripheral edema the patient presented with on the second of this month has had significant decrease.  The wife reports that the patient is now actively losing weight but over the past several days has been too weak to stand adequately check weights at home.  The wife states that the patient has been taking his prescriptions up until this morning at which time he was unable to take his prescriptions.  Patient has had decreased verbalization to the wife and the family.  The wife states that the patient is primarily communicating through yes/no questions or pointing at items.  Patient would answer yes no questions for me and denied any headache, chest pain, sensation of shortness of breath, abdominal pain, nausea.  Patient denied any musculoskeletal complaints.  Again this was using yes/no questions.   Patient with a history of anemia requiring frequent blood transfusions, MDS, CAD, CHF, hyperlipidemia, hypertension, A. fib, CVA, diabetes, cirrhosis.  The wife denies any fevers.  Patient has a chronic cough with out worsening according to the patient's wife.  Appetite has been significantly decreased and the patient is no longer eating or drinking currently.  No reports of painful urination or decreased or increased urination at home.  Again patient has been visibly losing weight in regards to peripheral edema according to the wife.  Patient is still having extremity pain from diagnosed gout.  The wife reports that the patient was not ambulatory initially due to the pain, now is unable due to weakness and pain.        Past Medical History:  Diagnosis Date  . Anemia   . Carotid arterial disease (HCC)   . CHF (congestive heart failure) (HCC)   . Coronary artery disease   . Hearing loss   . HOH (hard of hearing)   . Hyperlipidemia   . Hypertension   . Nocturia   . Obesity   . Osteoarthritis    "BUE; BLE; right knee" (03/23/2017)  . Peripheral vision loss, bilateral    S/P 07/2015  . Permanent atrial fibrillation (HCC)   . 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 Severe aortic stenosis   . Sinus drainage   . Stroke [facility]) 07/2015   "peripheral vision still bad out of left eye" (03/23/2017)  . Torn rotator cuff 2012   Right arm  . Type II diabetes mellitus (HCC)   . Urinary frequency  Patient Active Problem List   Diagnosis Date Noted  . Gout flare 02/22/2020  . COVID-19 virus infection 02/01/2020  . Acute metabolic encephalopathy [id number]  . Elevated troponin 02/03/2020  . Chronic anticoagulation 02/09/2020  . Generalized weakness 02/21/2020  . Palliative care encounter   . Effusion of right knee 01/27/2020  . Chronic systolic CHF (congestive heart failure) ([place]) 01/27/2020  . Acute on chronic diastolic heart failure ([place]) 01/26/2020  . Goals of care,  counseling/discussion 12/17/2019  . MDS (myelodysplastic syndrome), low grade ([place]) 12/16/2019  . Acute on chronic heart failure with preserved ejection fraction (HFpEF) ([place]) 12/10/2019  . Acute renal failure superimposed on stage 3 chronic kidney disease ([place]) 12/10/2019  . Cirrhosis ([place])   . Symptomatic anemia 12/07/2019  . Macrocytic anemia 04/16/2019  . S/P TAVR (transcatheter aortic valve replacement)   . Bronchitis 04/17/2017  . Severe aortic stenosis 03/23/2017  . Atrial fibrillation, permanent ([place]) 03/23/2017  . Peripheral vision loss, bilateral   . Osteoarthritis   . Hypertension   . HOH (hard of hearing)   . Heart murmur   . Frequency of urination   . Dyspnea   . Bruises easily   . Diabetes mellitus type 2, uncomplicated ([place]) 123456  . Essential hypertension 10/15/2015  . 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 Diarrhea 08/24/2015  . Stroke ([place]) 07/25/2015  . Edema of leg 03/20/2015  . Bradycardia 03/20/2015  . Nocturia 10/24/2014  . Aftercare following surgery of the circulatory system, [place] 06/07/2012  . Occlusion and stenosis of carotid artery without mention of cerebral infarction 04/14/2011  . Shoulder pain 04/14/2011  . 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 Diabetes mellitus ([place]) 02/01/2011  . Coronary artery disease   . Hyperlipidemia   . Hypertensive urgency   . Obesity   . Carotid arterial disease ([place])   . Torn rotator cuff 01/24/2010    Past Surgical History:  Procedure Laterality Date  . BACK SURGERY    . CARDIAC CATHETERIZATION  11/29/2005   SEVERE THREE VESSEL OBSTRUCTIVE ATHERSCLEROTIC CAD. ALL GRAFTS WERE PATENT. EF 65%  . CARDIAC CATHETERIZATION  03/23/2017  . CAROTID ENDARTERECTOMY Right 01-29-08   cea  . CARPAL TUNNEL RELEASE Right   . COLONOSCOPY N/A 12/10/2019   Procedure: COLONOSCOPY;  Surgeon: Wilford Corner, MD;  Location: [facility]  ENDOSCOPY;  Service: Endoscopy;  Laterality: N/A;  . CORONARY ARTERY BYPASS GRAFT  03/2003   X3. LIMA GRAFT TO THE LAD, SAPHENOUS VEIN GRAFT TO THE PA, AND A LEFT RADIAL GRAFT TO THEOBTUSE MARGINAL VESSEL  . ENDARTERECTOMY Left 05/28/2012   Procedure: ENDARTERECTOMY CAROTID;  Surgeon: Elam Dutch, MD;  Location: [place];  Service: Vascular;  Laterality: Left;  . ESOPHAGOGASTRODUODENOSCOPY N/A 12/10/2019   Procedure: ESOPHAGOGASTRODUODENOSCOPY (EGD);  Surgeon: Wilford Corner, MD;  Location: [place];  Service: Endoscopy;  Laterality: N/A;  . HEMOSTASIS CLIP PLACEMENT  12/10/2019   Procedure: HEMOSTASIS CLIP PLACEMENT;  Surgeon: Wilford Corner, MD;  Location: [place] ENDOSCOPY;  Service: Endoscopy;;  . INSERT / REPLACE / REMOVE PACEMAKER  03/23/2017  . [place]; 1984  . MOLE SURGERY     "? face/arms"  . PACEMAKER IMPLANT N/A 03/23/2017   Procedure: PACEMAKER IMPLANT;  Surgeon: Constance Haw, MD;  Location: [place] CV LAB;  Service: Cardiovascular;  Laterality: N/A;  . PATCH ANGIOPLASTY Left 05/28/2012   Procedure: WITH dACRON PATCH ANGIOPLASTY;  Surgeon: Elam Dutch, MD;  Location: [facility] OR;  Service: Vascular;  Laterality: Left;  . POLYPECTOMY  12/10/2019   Procedure: POLYPECTOMY;  Surgeon: Wilford Corner, MD;  Location: [facility] ENDOSCOPY;  Service: Endoscopy;;  . RIGHT/LEFT HEART CATH AND CORONARY/GRAFT ANGIOGRAPHY N/A 03/23/2017   Procedure: RIGHT/LEFT HEART CATH AND CORONARY/GRAFT ANGIOGRAPHY;  Surgeon: Sherren Mocha, MD;  Location: [place] CV LAB;  Service: Cardiovascular;  Laterality: N/A;  . TEE WITHOUT CARDIOVERSION N/A 05/09/2017   Procedure: TRANSESOPHAGEAL ECHOCARDIOGRAM (TEE);  Surgeon: Sherren Mocha, MD;  Location: [place];  Service: Open Heart Surgery;  Laterality: N/A;  . TRANSCATHETER AORTIC VALVE REPLACEMENT, TRANSFEMORAL N/A 05/09/2017   Procedure: TRANSCATHETER AORTIC VALVE REPLACEMENT, TRANSFEMORAL using an Edwards 46mm Sapien 3 Aortic Valve;   Surgeon: Sherren Mocha, MD;  Location: [place];  Service: Open Heart Surgery;  Laterality: N/A;    Prior to Admission medications   Medication Sig Start Date End Date Taking? Authorizing Provider  acetaminophen (TYLENOL) 650 MG CR tablet Take 1,300 mg by mouth every 8 (eight) hours as needed for pain.     [provider]  allopurinol (ZYLOPRIM) 100 MG tablet Take 1 tablet (100 mg total) by mouth daily. 02/04/20 02/03/21  Pahwani, Michell Heinrich, MD  amLODipine (NORVASC) 2.5 MG tablet Take 1 tablet (2.5 mg total) by mouth daily. 11/27/19 02/25/20  [name], [name] M, MD  apixaban (ELIQUIS) 5 MG TABS tablet Take 1 tablet (5 mg total) by mouth 2 (two) times daily. 01/09/20 07/07/20  Almyra Deforest, PA  benazepril (LOTENSIN) 40 MG tablet Take 40 mg by mouth daily. Patient not taking: Reported on 01/26/2020 01/20/20   [provider]  cyanocobalamin 1000 MCG tablet Take 1,000 mcg by mouth daily.     [provider]  docusate sodium (COLACE) 100 MG capsule Take 1 capsule (100 mg total) by mouth daily. 02/05/20   Pahwani, Michell Heinrich, MD  ezetimibe (ZETIA) 10 MG tablet TAKE 1 TABLET BY MOUTH EVERY DAY Patient taking differently: Take 10 mg by mouth daily. 01/08/19   [name], [name] M, MD  furosemide (LASIX) 80 MG tablet Take 80 mg by mouth 2 (two) times daily.    [provider]  glimepiride (AMARYL) 2 MG tablet TAKE 1 TABLET (2 MG TOTAL) BY MOUTH DAILY WITH BREAKFAST Patient taking differently: Take 2 mg by mouth daily with breakfast. 01/25/18   [name], [name] M, MD  hydrALAZINE (APRESOLINE) 25 MG tablet Take 1 tablet (25 mg total) by mouth 2 (two) times daily. 06/10/19   [name], [name] M, MD  lenalidomide (REVLIMID) 5 MG capsule Take 1 capsule (5 mg total) by mouth daily. Fanny Dance # [id number]     Date Obtained 01/01/2020 Patient taking differently: Take 5 mg by mouth daily. [employer] Auth # [id number]     Date Obtained 01/01/2020 01/01/20   Cammie Sickle, MD  loratadine (CLARITIN) 10 MG tablet  Take 10 mg by mouth daily.    [provider]  pantoprazole (PROTONIX) 40 MG tablet Take 1 tablet (40 mg total) by mouth daily. 01/14/20   [name], [name] M, MD  polyethylene glycol (MIRALAX / GLYCOLAX) 17 g packet Take 17 g by mouth daily. 02/04/20   Pahwani, Michell Heinrich, MD  Polyvinyl Alcohol-Povidone (REFRESH OP) Place 1 drop into both eyes daily as needed (for dry eyes).     [provider]  potassium chloride SA (KLOR-CON) 20 MEQ tablet Take 1 tablet (20 mEq total) by mouth 2 (two) times daily. 01/08/20   Almyra Deforest, PA  prochlorperazine (COMPAZINE) 10 MG tablet  Take 1 tablet (10 mg total) by mouth every 6 (six) hours as needed for nausea or vomiting. 01/06/20   Cammie Sickle, MD  tamsulosin (FLOMAX) 0.4 MG CAPS capsule TAKE 1 CAPSULE BY MOUTH ONCE DAILY Patient taking differently: Take 0.4 mg by mouth daily. 09/21/15   [name], [name] M, MD  traZODone (DESYREL) 50 MG tablet Take 50 mg by mouth at bedtime. Patient not taking: Reported on 01/26/2020 01/17/20   [provider]    Allergies Niacin, Niacin and related, Metformin and related, and Statins  Family History  Problem Relation Age of Onset  . Heart attack Father   . Heart attack Brother   . Parkinsonism Brother   . Cancer Brother   . Arrhythmia Brother     Social History Social History   Tobacco Use  . Smoking status: Former Smoker    Packs/day: 2.00    Years: 39.00    Pack years: 78.00    Types: Cigarettes    Quit date: 01/24/1994    Years since quitting: 26.0  . Smokeless tobacco: Never Used  Vaping Use  . Vaping Use: Never used  Substance Use Topics  . Alcohol use: No  . Drug use: No     Review of Systems  Constitutional: No reported fever/chills Eyes: No reported visual changes. No discharge ENT: No upper respiratory complaints. Cardiovascular: no chest pain. Respiratory: Chronic cough with no reported worsening or improvement. No SOB. Gastrointestinal: No abdominal pain.  No  nausea, no vomiting.  No diarrhea.  No constipation. Genitourinary: Negative for dysuria. No hematuria Musculoskeletal: Lower extremity pain secondary to known gout.  Wife reports that peripheral edema has significantly lessened since admission from 01/26/2020. Skin: Negative for rash, abrasions, lacerations, ecchymosis. Neurological: Negative for headaches, focal weakness or numbness.  10 System ROS otherwise negative.  ____________________________________________   PHYSICAL EXAM:  VITAL SIGNS: ED Triage Vitals [02-18-20 1820]  Enc Vitals Group     BP      Pulse      Resp      Temp      Temp src      SpO2      Weight 229 lb 4.5 oz (104 kg)     Height 6' (1.829 m)     Head Circumference      Peak Flow      Pain Score 0     Pain Loc      Pain Edu?      Excl. in [place]?      Constitutional: Alert apparently oriented.  Patient is relatively nonverbal but will answer simple yes/no questions and occasionally elaborate giving location of hospital, and acknowledging his name..  Generally ill appearing but in no acute distress. Eyes: Conjunctivae are normal. PERRL. EOMI. Head: Atraumatic. ENT:      Ears:       Nose: No congestion/rhinnorhea.      Mouth/Throat: Mucous membranes are moist.  Neck: No stridor.  Neck is supple.  No tenderness. Hematological/Lymphatic/Immunilogical: No cervical lymphadenopathy. Cardiovascular: Normal rate, regular rhythm. Normal S1 and S2.  Good peripheral circulation. Respiratory: Normal respiratory effort without tachypnea or retractions. Lungs without wheezing, rales or rhonchi. Good air entry to the bases with no decreased or absent breath sounds. Gastrointestinal: Bowel sounds 4 quadrants. Soft and nontender to palpation. No guarding or rigidity. No palpable masses. No distention. No CVA tenderness. Musculoskeletal: Full range of motion to all extremities. No gross deformities appreciated.  No gross peripheral edema.  This appears to  be significant  change from previous encounter here in the ED. Neurologic:  Normal speech and language.  Skin:  Skin is warm, dry and intact. No rash noted. Psychiatric: Mood and affect are normal. Speech and behavior are normal. Patient exhibits appropriate insight and judgement.   ____________________________________________   LABS (all labs ordered are listed, but only abnormal results are displayed)  Labs Reviewed  SARS CORONAVIRUS 2 BY RT PCR (HOSPITAL ORDER, [facility]) - Abnormal; Notable for the following components:      Result Value   SARS Coronavirus 2 POSITIVE (*)    All other components within normal limits  CBC WITH DIFFERENTIAL/PLATELET - Abnormal; Notable for the following components:   WBC 3.2 (*)    RBC 1.77 (*)    Hemoglobin 5.7 (*)    HCT 18.9 (*)    MCV 106.8 (*)    RDW 21.5 (*)    Platelets 46 (*)    nRBC 1.9 (*)    Lymphs Abs 0.4 (*)    All other components within normal limits  COMPREHENSIVE METABOLIC PANEL - Abnormal; Notable for the following components:   Sodium 153 (*)    CO2 34 (*)    Glucose, Bld 213 (*)    BUN 69 (*)    Creatinine, Ser 1.70 (*)    Calcium 8.7 (*)    Albumin 2.8 (*)    Total Bilirubin 4.2 (*)    GFR, Estimated 41 (*)    Anion gap 18 (*)    All other components within normal limits  LACTIC ACID, PLASMA - Abnormal; Notable for the following components:   Lactic Acid, Venous 2.1 (*)    All other components within normal limits  BRAIN NATRIURETIC PEPTIDE - Abnormal; Notable for the following components:   B Natriuretic Peptide 1,999.6 (*)    All other components within normal limits  AMMONIA - Abnormal; Notable for the following components:   Ammonia 38 (*)    All other components within normal limits  MAGNESIUM - Abnormal; Notable for the following components:   Magnesium 2.6 (*)    All other components within normal limits  TROPONIN I (HIGH SENSITIVITY) - Abnormal; Notable for the following components:    Troponin I (High Sensitivity) 210 (*)    All other components within normal limits  LACTIC ACID, PLASMA  URINALYSIS, COMPLETE (UACMP) WITH MICROSCOPIC  CBG MONITORING, ED  TYPE AND SCREEN  PREPARE RBC (CROSSMATCH)  TROPONIN I (HIGH SENSITIVITY)   ____________________________________________  EKG   ____________________________________________  RADIOLOGY I personally viewed and evaluated these images as part of my medical decision making, as well as reviewing the written report by the radiologist.  ED Provider Interpretation:   DG Chest 2 View  Result Date: 02/03/2020 CLINICAL DATA:  Altered level of consciousness EXAM: CHEST - 2 VIEW COMPARISON:  01/30/2020 FINDINGS: Frontal and lateral views of the chest demonstrate stable single lead pacemaker. 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 Cardiac silhouette is mildly enlarged but stable. There is persistent central vascular congestion, with patchy left basilar airspace disease and small left effusion. No pneumothorax. No acute bony abnormalities. IMPRESSION: 1. Mild congestive heart failure, without significant change since previous. Electronically Signed   By: Randa Ngo M.D.   On: 01/31/2020 18:58   CT Head Wo Contrast  Result Date: 02/16/2020 CLINICAL DATA:  80 year old male with history of altered mental status. EXAM: CT HEAD WITHOUT CONTRAST TECHNIQUE: Contiguous axial images were obtained from the base of  the skull through the vertex without intravenous contrast. COMPARISON:  Head CT 08/24/2015.  Brain MRI 08/24/2015. 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 No evidence of acute infarction, hemorrhage, hydrocephalus, extra-axial collection or mass lesion/mass effect. Vascular: No hyperdense vessel or unexpected calcification. Skull: Normal. Negative for fracture or focal  lesion. Sinuses/Orbits: No acute finding. Other: None. IMPRESSION: 1. No acute intracranial abnormalities. 2. 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 Electronically Signed   By: Vinnie Langton M.D.   On: 02/08/2020 19:36    ____________________________________________    PROCEDURES  Procedure(s) performed:    .Critical Care Performed by: Darletta Moll, PA-C Authorized by: Darletta Moll, PA-C   Critical care provider statement:    Critical care time (minutes):  38   Critical care time was exclusive of:  Separately billable procedures and treating other patients and teaching time   Critical care was necessary to treat or prevent imminent or life-threatening deterioration of the following conditions:  Circulatory failure   Critical care was time spent personally by me on the following activities:  Obtaining history from patient or surrogate, examination of patient, evaluation of patient's response to treatment, development of treatment plan with patient or surrogate, ordering and performing treatments and interventions, ordering and review of laboratory studies, ordering and review of radiographic studies, pulse oximetry, re-evaluation of patient's condition and review of old charts   Care discussed with: admitting provider        Medications  0.9 %  sodium chloride infusion (has no administration in time range)     ____________________________________________   INITIAL IMPRESSION / ASSESSMENT AND PLAN / ED COURSE  Pertinent labs & imaging results that were available during my care of the patient were reviewed by me and considered in my medical decision making (see chart for details).  Review of the [place] CSRS was performed in accordance of the [place] prior to dispensing any controlled drugs.  Clinical Course as of 02/23/2020 2137  Sat Feb 15, 2020  1905 Patient presented to the emergency department with  his wife for complaint of increased confusion, weakness over the past several days.  Patient was admitted to the hospital for CHF exacerbation and discharged 11 days ago.  Wife reports that over the past 2 to 3 days patient has had significant decline.  Patient is answering yes/no questions but not further elaborating.  The wife states that the patient is primarily pointing at objects at the house rather than talking.  He has not ate or drink today and did not take his morning medications.  Differential includes infection such as pneumonia, urinary tract infection, COVID, CVA, STEMI/NSTEMI, CHF exacerbation, anemia. [JC]    Clinical Course User Index [JC] Pratt Bress, Charline Bills, PA-C          Patient's diagnosis is consistent with altered mental status, COVID-19, symptomatic anemia, elevated troponin, hypernatremia, myelodysplastic syndrome, thrombocytopenia.  Patient presented to the emergency department with confusion, increased weakness, decreased appetite and not eating and drinking over the last 24 hours.  Patient has multiple chronic medical issues.  He was recently admitted for CHF and acute gout flare.  Patient had been relatively sedentary given the pain from his gout.  However patient was at his normal mental baseline.  Evidently the peripheral edema had been significantly improving after his recent admission the patient began describing increased  weakness, decreased appetite, increased confusion.  Today patient had been very confused, extremely weak to the point of being unable to sit up without assistance.  The wife reports that with the patient's MDS he will require frequent transfusions and the increasing weakness and confusion is a typical sign.  There have been no reported fevers or chills, URI symptoms.  No reported chest pain or shortness of breath.  No abdominal pain.  Patient was relatively noncontributory to his history only able to answer yes/no questions.  80 of history was  obtained from the wife.  Patient was found to be anemic at 5.7.  He typically is transfused within the 7 range of his hemoglobin.  At this time I have ordered 2 units of red blood cells for the patient.  Patient had multiple other findings included elevated troponin, elevated lactic, hypernatremia, elevated ammonia, thrombocytopenia.  Given the multiple abnormal findings including a positive COVID-19 test, I felt that the patient was a good candidate for admission.  I talked with the hospitalist for admission for weakness, confusion, anemia, COVID-19.Marland Kitchen  Hospitalist agrees to admit the patient at this time.     ____________________________________________  FINAL CLINICAL IMPRESSION(S) / ED DIAGNOSES  Final diagnoses:  Altered mental status, unspecified altered mental status type  COVID-19  Symptomatic anemia  Elevated troponin  Hypernatremia  MDS (myelodysplastic syndrome) ([place])  Thrombocytopenic ([place])      NEW MEDICATIONS STARTED DURING THIS VISIT:  ED Discharge Orders    None          This chart was dictated using voice recognition software/Dragon. Despite best efforts to proofread, errors can occur which can change the meaning. Any change was purely unintentional.    Brynda Peon 02/07/2020 2137    Carrie Mew, MD 01/26/2020 [phone]

## 2020-02-16 ENCOUNTER — Inpatient Hospital Stay: Payer: Medicare Other

## 2020-02-16 DIAGNOSIS — A419 Sepsis, unspecified organism: Secondary | ICD-10-CM | POA: Diagnosis not present

## 2020-02-16 DIAGNOSIS — J9601 Acute respiratory failure with hypoxia: Secondary | ICD-10-CM | POA: Diagnosis not present

## 2020-02-16 DIAGNOSIS — G9341 Metabolic encephalopathy: Secondary | ICD-10-CM | POA: Diagnosis not present

## 2020-02-16 DIAGNOSIS — E872 Acidosis, unspecified: Secondary | ICD-10-CM

## 2020-02-16 DIAGNOSIS — R7881 Bacteremia: Secondary | ICD-10-CM

## 2020-02-16 DIAGNOSIS — B952 Enterococcus as the cause of diseases classified elsewhere: Secondary | ICD-10-CM

## 2020-02-16 DIAGNOSIS — R6521 Severe sepsis with septic shock: Secondary | ICD-10-CM

## 2020-02-16 DIAGNOSIS — U071 COVID-19: Secondary | ICD-10-CM | POA: Diagnosis not present

## 2020-02-16 DIAGNOSIS — N179 Acute kidney failure, unspecified: Secondary | ICD-10-CM | POA: Diagnosis not present

## 2020-02-16 DIAGNOSIS — L8990 Pressure ulcer of unspecified site, unspecified stage: Secondary | ICD-10-CM | POA: Insufficient documentation

## 2020-02-16 LAB — CBC WITH DIFFERENTIAL/PLATELET
Abs Immature Granulocytes: 0.05 10*3/uL (ref 0.00–0.07)
Basophils Absolute: 0 10*3/uL (ref 0.0–0.1)
Basophils Relative: 0 %
Eosinophils Absolute: 0 10*3/uL (ref 0.0–0.5)
Eosinophils Relative: 0 %
HCT: 23.4 % — ABNORMAL LOW (ref 39.0–52.0)
Hemoglobin: 7.5 g/dL — ABNORMAL LOW (ref 13.0–17.0)
Immature Granulocytes: 1 %
Lymphocytes Relative: 10 %
Lymphs Abs: 0.4 10*3/uL — ABNORMAL LOW (ref 0.7–4.0)
MCH: 29.9 pg (ref 26.0–34.0)
MCHC: 32.1 g/dL (ref 30.0–36.0)
MCV: 93.2 fL (ref 80.0–100.0)
Monocytes Absolute: 0.1 10*3/uL (ref 0.1–1.0)
Monocytes Relative: 2 %
Neutro Abs: 3.3 10*3/uL (ref 1.7–7.7)
Neutrophils Relative %: 87 %
Platelets: 35 10*3/uL — ABNORMAL LOW (ref 150–400)
RBC: 2.51 MIL/uL — ABNORMAL LOW (ref 4.22–5.81)
RDW: 25.6 % — ABNORMAL HIGH (ref 11.5–15.5)
Smear Review: DECREASED
WBC: 3.8 10*3/uL — ABNORMAL LOW (ref 4.0–10.5)
nRBC: 2.6 % — ABNORMAL HIGH (ref 0.0–0.2)

## 2020-02-16 LAB — FIBRIN DERIVATIVES D-DIMER (ARMC ONLY): Fibrin derivatives D-dimer (ARMC): 503.29 ng/mL (FEU) — ABNORMAL HIGH (ref 0.00–499.00)

## 2020-02-16 LAB — BLOOD CULTURE ID PANEL (REFLEXED) - BCID2

## 2020-02-16 LAB — CBG MONITORING, ED
Glucose-Capillary: 197 mg/dL — ABNORMAL HIGH (ref 70–99)
Glucose-Capillary: 212 mg/dL — ABNORMAL HIGH (ref 70–99)

## 2020-02-16 LAB — COMPREHENSIVE METABOLIC PANEL
ALT: 21 U/L (ref 0–44)
AST: 30 U/L (ref 15–41)
Albumin: 2.3 g/dL — ABNORMAL LOW (ref 3.5–5.0)
Alkaline Phosphatase: 84 U/L (ref 38–126)
Anion gap: 14 (ref 5–15)
BUN: 79 mg/dL — ABNORMAL HIGH (ref 8–23)
CO2: 33 mmol/L — ABNORMAL HIGH (ref 22–32)
Calcium: 8.1 mg/dL — ABNORMAL LOW (ref 8.9–10.3)
Chloride: 105 mmol/L (ref 98–111)
Creatinine, Ser: 2.05 mg/dL — ABNORMAL HIGH (ref 0.61–1.24)
GFR, Estimated: 33 mL/min — ABNORMAL LOW (ref >60)
Glucose, Bld: 296 mg/dL — ABNORMAL HIGH (ref 70–99)
Potassium: 3.4 mmol/L — ABNORMAL LOW (ref 3.5–5.1)
Sodium: 152 mmol/L — ABNORMAL HIGH (ref 135–145)
Total Bilirubin: 3.9 mg/dL — ABNORMAL HIGH (ref 0.3–1.2)
Total Protein: 5.4 g/dL — ABNORMAL LOW (ref 6.5–8.1)

## 2020-02-16 LAB — PROTIME-INR
INR: 2.2 — ABNORMAL HIGH (ref 0.8–1.2)
Prothrombin Time: 23.6 seconds — ABNORMAL HIGH (ref 11.4–15.2)

## 2020-02-16 LAB — BASIC METABOLIC PANEL
Anion gap: 17 — ABNORMAL HIGH (ref 5–15)
BUN: 75 mg/dL — ABNORMAL HIGH (ref 8–23)
CO2: 32 mmol/L (ref 22–32)
Calcium: 8 mg/dL — ABNORMAL LOW (ref 8.9–10.3)
Chloride: 103 mmol/L (ref 98–111)
Creatinine, Ser: 1.92 mg/dL — ABNORMAL HIGH (ref 0.61–1.24)
GFR, Estimated: 35 mL/min — ABNORMAL LOW (ref >60)
Glucose, Bld: 233 mg/dL — ABNORMAL HIGH (ref 70–99)
Potassium: 3.7 mmol/L (ref 3.5–5.1)
Sodium: 152 mmol/L — ABNORMAL HIGH (ref 135–145)

## 2020-02-16 LAB — LACTIC ACID, PLASMA
Lactic Acid, Venous: 6.6 mmol/L (ref 0.5–1.9)
Lactic Acid, Venous: 3.7 mmol/L (ref 0.5–1.9)
Lactic Acid, Venous: 1.9 mmol/L (ref 0.5–1.9)

## 2020-02-16 LAB — CBC
HCT: 22 % — ABNORMAL LOW (ref 39.0–52.0)
Hemoglobin: 6.8 g/dL — ABNORMAL LOW (ref 13.0–17.0)
MCH: 30.2 pg (ref 26.0–34.0)
MCHC: 30.9 g/dL (ref 30.0–36.0)
MCV: 97.8 fL (ref 80.0–100.0)
Platelets: 41 10*3/uL — ABNORMAL LOW (ref 150–400)
RBC: 2.25 MIL/uL — ABNORMAL LOW (ref 4.22–5.81)
RDW: 25.7 % — ABNORMAL HIGH (ref 11.5–15.5)
WBC: 3.5 10*3/uL — ABNORMAL LOW (ref 4.0–10.5)
nRBC: 4.6 % — ABNORMAL HIGH (ref 0.0–0.2)

## 2020-02-16 LAB — AMMONIA: Ammonia: 33 umol/L (ref 9–35)

## 2020-02-16 LAB — LACTATE DEHYDROGENASE: LDH: 320 U/L — ABNORMAL HIGH (ref 98–192)

## 2020-02-16 LAB — URINALYSIS, COMPLETE (UACMP) WITH MICROSCOPIC
Bilirubin Urine: NEGATIVE
Glucose, UA: NEGATIVE mg/dL
Hgb urine dipstick: NEGATIVE
Ketones, ur: NEGATIVE mg/dL
Leukocytes,Ua: NEGATIVE
Nitrite: NEGATIVE
Protein, ur: NEGATIVE mg/dL
Specific Gravity, Urine: 1.014 (ref 1.005–1.030)
pH: 5 (ref 5.0–8.0)

## 2020-02-16 LAB — HEMOGLOBIN A1C
Hgb A1c MFr Bld: 5.8 % — ABNORMAL HIGH (ref 4.8–5.6)
Mean Plasma Glucose: 119.76 mg/dL

## 2020-02-16 LAB — PREPARE RBC (CROSSMATCH)

## 2020-02-16 LAB — GLUCOSE, CAPILLARY
Glucose-Capillary: 243 mg/dL — ABNORMAL HIGH (ref 70–99)
Glucose-Capillary: 271 mg/dL — ABNORMAL HIGH (ref 70–99)
Glucose-Capillary: 211 mg/dL — ABNORMAL HIGH (ref 70–99)

## 2020-02-16 LAB — APTT: aPTT: 40 seconds — ABNORMAL HIGH (ref 24–36)

## 2020-02-16 LAB — C-REACTIVE PROTEIN: CRP: 12.1 mg/dL — ABNORMAL HIGH (ref <1.0)

## 2020-02-16 LAB — PROCALCITONIN: Procalcitonin: 0.47 ng/mL

## 2020-02-16 LAB — FERRITIN: Ferritin: 1881 ng/mL — ABNORMAL HIGH (ref 24–336)

## 2020-02-16 LAB — TROPONIN I (HIGH SENSITIVITY): Troponin I (High Sensitivity): 219 ng/L (ref <18)

## 2020-02-16 LAB — CORTISOL: Cortisol, Plasma: >100 ug/dL

## 2020-02-16 MED ORDER — SODIUM CHLORIDE 0.9 % IV SOLN
200.0000 mg | Freq: Once | INTRAVENOUS | Status: AC
  Administered 2020-02-16: 200 mg via INTRAVENOUS
  Filled 2020-02-16: qty 200

## 2020-02-16 MED ORDER — NEPRO/CARBSTEADY PO LIQD
237.0000 mL | Freq: Three times a day (TID) | ORAL | Status: DC
  Administered 2020-02-17: 237 mL via ORAL

## 2020-02-16 MED ORDER — PHENYLEPHRINE HCL-NACL 10-0.9 MG/250ML-% IV SOLN
25.0000 ug/min | INTRAVENOUS | Status: DC
  Filled 2020-02-16: qty 250

## 2020-02-16 MED ORDER — SODIUM CHLORIDE 0.9 % IV SOLN
2.0000 g | Freq: Four times a day (QID) | INTRAVENOUS | Status: DC
  Administered 2020-02-16 – 2020-02-20 (×16): 2 g via INTRAVENOUS
  Filled 2020-02-16 (×4): qty 2000
  Filled 2020-02-16: qty 2
  Filled 2020-02-16 (×4): qty 2000
  Filled 2020-02-16: qty 2
  Filled 2020-02-16 (×5): qty 2000
  Filled 2020-02-16: qty 2
  Filled 2020-02-16 (×2): qty 2000

## 2020-02-16 MED ORDER — GENTAMICIN SULFATE 40 MG/ML IJ SOLN
1.5000 mg/kg | Freq: Once | INTRAVENOUS | Status: DC
  Filled 2020-02-16: qty 3.25

## 2020-02-16 MED ORDER — METRONIDAZOLE IN NACL 5-0.79 MG/ML-% IV SOLN
500.0000 mg | Freq: Three times a day (TID) | INTRAVENOUS | Status: DC
  Administered 2020-02-16 (×2): 500 mg via INTRAVENOUS
  Filled 2020-02-16 (×4): qty 100

## 2020-02-16 MED ORDER — SODIUM CHLORIDE 0.9 % IV SOLN
2.0000 g | Freq: Once | INTRAVENOUS | Status: AC
  Administered 2020-02-16: 2 g via INTRAVENOUS
  Filled 2020-02-16: qty 2

## 2020-02-16 MED ORDER — PANTOPRAZOLE SODIUM 40 MG IV SOLR
40.0000 mg | INTRAVENOUS | Status: DC
  Administered 2020-02-16 – 2020-02-26 (×11): 40 mg via INTRAVENOUS
  Filled 2020-02-16 (×11): qty 40

## 2020-02-16 MED ORDER — SODIUM CHLORIDE 0.9 % IV SOLN
2.0000 g | Freq: Two times a day (BID) | INTRAVENOUS | Status: DC
  Filled 2020-02-16: qty 2

## 2020-02-16 MED ORDER — LACTATED RINGERS IV BOLUS (SEPSIS)
1000.0000 mL | Freq: Once | INTRAVENOUS | Status: AC
  Administered 2020-02-16: 1000 mL via INTRAVENOUS

## 2020-02-16 MED ORDER — SODIUM CHLORIDE 0.9% IV SOLUTION
Freq: Once | INTRAVENOUS | Status: DC
  Filled 2020-02-16: qty 250

## 2020-02-16 MED ORDER — VANCOMYCIN HCL IN DEXTROSE 1-5 GM/200ML-% IV SOLN
1000.0000 mg | Freq: Once | INTRAVENOUS | Status: AC
  Administered 2020-02-16: 1000 mg via INTRAVENOUS
  Filled 2020-02-16: qty 200

## 2020-02-16 MED ORDER — VANCOMYCIN HCL IN DEXTROSE 1-5 GM/200ML-% IV SOLN
1000.0000 mg | INTRAVENOUS | Status: DC
  Administered 2020-02-16: 1000 mg via INTRAVENOUS
  Filled 2020-02-16: qty 200

## 2020-02-16 MED ORDER — MORPHINE SULFATE (PF) 2 MG/ML IV SOLN
INTRAVENOUS | Status: AC
  Administered 2020-02-16: 2 mg via INTRAVENOUS
  Filled 2020-02-16: qty 1

## 2020-02-16 MED ORDER — LACTATED RINGERS IV BOLUS (SEPSIS): 1000.0000 mL | Freq: Once | INTRAVENOUS | Status: DC

## 2020-02-16 MED ORDER — LACTATED RINGERS IV SOLN: INTRAVENOUS | Status: DC

## 2020-02-16 MED ORDER — MORPHINE SULFATE (PF) 2 MG/ML IV SOLN: 2.0000 mg | Freq: Once | INTRAVENOUS | Status: AC

## 2020-02-16 MED ORDER — LACTATED RINGERS IV BOLUS (SEPSIS): 500.0000 mL | Freq: Once | INTRAVENOUS | Status: DC

## 2020-02-16 MED ORDER — PHENYLEPHRINE HCL-NACL 10-0.9 MG/250ML-% IV SOLN
0.0000 ug/min | INTRAVENOUS | Status: DC
  Filled 2020-02-16: qty 250

## 2020-02-16 MED ORDER — METHYLPREDNISOLONE SODIUM SUCC 40 MG IJ SOLR
40.0000 mg | Freq: Two times a day (BID) | INTRAMUSCULAR | Status: DC
  Administered 2020-02-16 – 2020-02-24 (×16): 40 mg via INTRAVENOUS
  Filled 2020-02-16 (×16): qty 1

## 2020-02-16 MED ORDER — METHYLPREDNISOLONE SODIUM SUCC 125 MG IJ SOLR
60.0000 mg | Freq: Two times a day (BID) | INTRAMUSCULAR | Status: DC
  Administered 2020-02-16: 60 mg via INTRAVENOUS
  Filled 2020-02-16: qty 2

## 2020-02-16 MED ORDER — SODIUM CHLORIDE 0.9 % IV SOLN
100.0000 mg | Freq: Every day | INTRAVENOUS | Status: AC
  Administered 2020-02-17 – 2020-02-20 (×4): 100 mg via INTRAVENOUS
  Filled 2020-02-16: qty 20
  Filled 2020-02-16 (×2): qty 100
  Filled 2020-02-16: qty 20

## 2020-02-16 MED ORDER — SODIUM CHLORIDE 0.9 % IV SOLN: 250.0000 mL | INTRAVENOUS | Status: DC

## 2020-02-16 MED ORDER — DEXAMETHASONE SODIUM PHOSPHATE 10 MG/ML IJ SOLN: 6.0000 mg | Freq: Every day | INTRAMUSCULAR | Status: DC

## 2020-02-16 MED ORDER — GENTAMICIN SULFATE 40 MG/ML IJ SOLN: 90.0000 mg | INTRAVENOUS | Status: DC

## 2020-02-16 MED ORDER — ADULT MULTIVITAMIN W/MINERALS CH
1.0000 | ORAL_TABLET | Freq: Every day | ORAL | Status: DC
  Administered 2020-02-21 – 2020-02-25 (×5): 1 via ORAL
  Filled 2020-02-16 (×6): qty 1

## 2020-02-16 MED ORDER — CHLORHEXIDINE GLUCONATE CLOTH 2 % EX PADS
6.0000 | MEDICATED_PAD | Freq: Every day | CUTANEOUS | Status: DC
  Administered 2020-02-16 – 2020-02-26 (×11): 6 via TOPICAL

## 2020-02-16 MED ORDER — INSULIN ASPART 100 UNIT/ML ~~LOC~~ SOLN
0.0000 [IU] | SUBCUTANEOUS | Status: DC
  Administered 2020-02-16: 8 [IU] via SUBCUTANEOUS
  Administered 2020-02-16 (×3): 5 [IU] via SUBCUTANEOUS
  Filled 2020-02-16 (×2): qty 1

## 2020-02-16 MED ORDER — SODIUM CHLORIDE 0.9 % IV SOLN
2.0000 g | Freq: Two times a day (BID) | INTRAVENOUS | Status: DC
  Administered 2020-02-16 – 2020-02-26 (×21): 2 g via INTRAVENOUS
  Filled 2020-02-16 (×10): qty 20
  Filled 2020-02-16: qty 2
  Filled 2020-02-16 (×7): qty 20
  Filled 2020-02-16 (×3): qty 2
  Filled 2020-02-16 (×2): qty 20
  Filled 2020-02-16: qty 2

## 2020-02-16 MED ORDER — LACTATED RINGERS IV BOLUS
1000.0000 mL | Freq: Once | INTRAVENOUS | Status: AC
  Administered 2020-02-16: 1000 mL via INTRAVENOUS

## 2020-02-16 NOTE — ED Notes (Signed)
Report given to [facility] RN

## 2020-02-16 NOTE — Procedures (Signed)
Central Venous Catheter Insertion Procedure Note  BRADAN CONGROVE  [id number]  1940/11/26  Date:02/16/20  Time:5:00 AM   Provider Performing:Kindel Rochefort L [place]   Procedure: Insertion of Non-tunneled Central Venous [phone]) with US guidance (95284)   Indication(s) Medication administration and Difficult access  Consent Risks of the procedure as well as the alternatives and risks of each were explained to the patient and/or caregiver.  Consent for the procedure was obtained and is signed in the bedside chart  Anesthesia Topical only with 1% lidocaine  Pre-medicated with 2 mg of morphine Timeout Verified patient identification, verified procedure, site/side was marked, verified correct patient position, special equipment/implants available, medications/allergies/relevant history reviewed, required imaging and test results available.  Sterile Technique Maximal sterile technique including full sterile barrier drape, hand hygiene, sterile gown, sterile gloves, mask, hair covering, sterile ultrasound probe cover (if used).  Procedure Description Area of catheter insertion was cleaned with chlorhexidine and draped in sterile fashion.  With real-time ultrasound guidance a central venous catheter was placed into the right internal jugular vein. Nonpulsatile blood flow and easy flushing noted in all ports.  The catheter was sutured in place and sterile dressing applied.  Complications/Tolerance None; patient tolerated the procedure well. Chest X-ray is ordered to verify placement for internal jugular or subclavian cannulation.   Chest x-ray is not ordered for femoral cannulation.  EBL Minimal  Specimen(s) None   Venetia Night, AGACNP-BC Acute Care Nurse Practitioner [facility]   [phone] / [phone] Please see Amion for pager details.

## 2020-02-16 NOTE — Progress Notes (Signed)
Patient ID: Evan [name], male   DOB: Apr 08, 1940, 80 y.o.   MRN: [id number] [place] PROGRESS NOTE  Evan [name] NWG:[id number] DOB: November 15, 1940 DOA: 02/07/2020 PCP: Leonel Ramsay, MD  HPI/Subjective: Awakened this morning.  He was confused.  Yelling out for family.  Some shortness of breath.  Feels weak.  Admitted with acute hypoxic respiratory failure sepsis and COVID-positive.  Objective: Vitals:   02/16/20 1130 02/16/20 1145  BP: 107/62 116/69  Pulse: 78 81  Resp: (!) 22 (!) 22  Temp: 97.9 F (36.6 C) (!) 97 F (36.1 C)  SpO2: 98% 98%    Intake/Output Summary (Last 24 hours) at 02/16/2020 1201 Last data filed at 02/16/2020 0359 Gross per 24 hour  Intake 330 ml  Output -  Net 330 ml   Filed Weights   02/09/2020 1820  Weight: 104 kg    ROS: Review of Systems  Unable to perform ROS: Acuity of condition  Respiratory: Positive for shortness of breath.   Cardiovascular: Negative for chest pain.  Gastrointestinal: Negative for abdominal pain.   Exam: Physical Exam HENT:     Head: Normocephalic.     Mouth/Throat:     Pharynx: No oropharyngeal exudate.  Eyes:     General: Lids are normal.     Conjunctiva/sclera: Conjunctivae normal.  Cardiovascular:     Rate and Rhythm: Normal rate and regular rhythm.     Heart sounds: Normal heart sounds, S1 normal and S2 normal.  Pulmonary:     Breath sounds: Examination of the right-lower field reveals decreased breath sounds. Examination of the left-lower field reveals decreased breath sounds. Decreased breath sounds present. No rhonchi or rales.  Abdominal:     Palpations: Abdomen is soft.     Tenderness: There is no abdominal tenderness.  Musculoskeletal:     Right knee: Swelling present.     Left knee: Swelling present.  Skin:    General: Skin is warm.     Findings: No rash.  Neurological:     Mental Status: He is alert.     Comments: Answered a few questions but was yelling out for family       Data  Reviewed: Basic Metabolic Panel: Recent Labs  Lab 02/17/2020 1907 02/16/20 0749  NA 153* 152*  K 3.6 3.7  CL 101 103  CO2 34* 32  GLUCOSE 213* 233*  BUN 69* 75*  CREATININE 1.70* 1.92*  CALCIUM 8.7* 8.0*  MG 2.6*  --    Liver Function Tests: Recent Labs  Lab 02/12/2020 1907  AST 32  ALT 25  ALKPHOS 107  BILITOT 4.2*  PROT 6.5  ALBUMIN 2.8*    Recent Labs  Lab 02/19/2020 1907 02/16/20 0749  AMMONIA 38* 33   CBC: Recent Labs  Lab 02/07/2020 1907 02/16/20 0601  WBC 3.2* 3.5*  NEUTROABS 2.6  --   HGB 5.7* 6.8*  HCT 18.9* 22.0*  MCV 106.8* 97.8  PLT 46* 41*   BNP (last 3 results) Recent Labs    01/26/20 1300 02/16/2020 1907  BNP 2,541.1* 1,999.6*    ProBNP (last 3 results) Recent Labs    12/06/19 1633  PROBNP 9,423*    CBG: Recent Labs  Lab 02/06/2020 2211 02/16/20 0635 02/16/20 0754 02/16/20 1114  GLUCAP 195* 197* 212* 243*    Recent Results (from the past 240 hour(s))  SARS Coronavirus 2 by RT PCR (hospital order, performed in [facility] hospital lab) Nasopharyngeal Nasopharyngeal Swab     Status: Abnormal  Collection Time: 01/29/2020  7:07 PM   Specimen: Nasopharyngeal Swab  Result Value Ref Range Status   SARS Coronavirus 2 POSITIVE (A) NEGATIVE Final    Comment: RESULT CALLED TO, READ BACK BY AND VERIFIED WITH: CHRISTINE HALLAS AT 2050 01/30/2020. MF (NOTE) SARS-CoV-2 target nucleic acids are DETECTED  SARS-CoV-2 RNA is generally detectable in upper respiratory specimens  during the acute phase of infection.  Positive results are indicative  of the presence of the identified virus, but do not rule out bacterial infection or co-infection with other pathogens not detected by the test.  Clinical correlation with patient history and  other diagnostic information is necessary to determine patient infection status.  The expected result is negative.  Fact Sheet for Patients:   StrictlyIdeas.no   Fact Sheet for Healthcare  Providers:   BankingDealers.co.za    This test is not yet approved or cleared by the [place] FDA and  has been authorized for detection and/or diagnosis of SARS-CoV-2 by FDA under an Emergency Use Authorization (EUA).  This EUA will remain in effect (meaning this  test can be used) for the duration of  the COVID-19 declaration under Section 564(b)(1) of the Act, 21 U.S.C. section 360-bbb-3(b)(1), unless the authorization is terminated or revoked sooner.  Performed at [facility], [address] [place], [place] [address]   CULTURE, BLOOD (ROUTINE X 2) w Reflex to ID Panel     Status: None (Preliminary result)   Collection Time: 02/16/20  2:11 AM   Specimen: BLOOD  Result Value Ref Range Status   Specimen Description BLOOD LEFT ANTECUBITAL  Final   Special Requests   Final    BOTTLES DRAWN AEROBIC AND ANAEROBIC Blood Culture results may not be optimal due to an excessive volume of blood received in culture bottles   Culture   Final    NO GROWTH < 12 HOURS Performed at [facility], [address] [place], [place] [address]    Report Status PENDING  Incomplete  CULTURE, BLOOD (ROUTINE X 2) w Reflex to ID Panel     Status: None (Preliminary result)   Collection Time: 02/16/20  2:11 AM   Specimen: BLOOD  Result Value Ref Range Status   Specimen Description BLOOD BLOOD RIGHT FOREARM  Final   Special Requests   Final    BOTTLES DRAWN AEROBIC AND ANAEROBIC Blood Culture adequate volume   Culture  Setup Time   Final    Organism ID to follow Performed at [facility], [address] [place],  [address]    Culture PENDING  Incomplete   Report Status PENDING  Incomplete     Studies: DG Chest 2 View  Result Date: 02/11/2020 CLINICAL DATA:  Altered level of consciousness EXAM: CHEST - 2 VIEW COMPARISON:  01/30/2020 FINDINGS: Frontal and lateral views of the chest demonstrate stable single lead pacemaker. Postsurgical  changes from CABG and aortic valve replacement. Cardiac silhouette is mildly enlarged but stable. There is persistent central vascular congestion, with patchy left basilar airspace disease and small left effusion. No pneumothorax. No acute bony abnormalities. IMPRESSION: 1. Mild congestive heart failure, without significant change since previous. Electronically Signed   By: Randa Ngo M.D.   On: 02/22/2020 18:58   CT Head Wo Contrast  Result Date: 02/02/2020 CLINICAL DATA:  80 year old male with history of altered mental status. EXAM: CT HEAD WITHOUT CONTRAST TECHNIQUE: Contiguous axial images were obtained from the base of the skull through the vertex without intravenous contrast.  COMPARISON:  Head CT 08/24/2015.  Brain MRI 08/24/2015. 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 No evidence of acute infarction, hemorrhage, hydrocephalus, extra-axial collection or mass lesion/mass effect. Vascular: No hyperdense vessel or unexpected calcification. Skull: Normal. Negative for fracture or focal lesion. Sinuses/Orbits: No acute finding. Other: None. IMPRESSION: 1. No acute intracranial abnormalities. 2. 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 Electronically Signed   By: Vinnie Langton M.D.   On: 01/25/2020 19:36   DG CHEST PORT 1 VIEW  Result Date: 02/16/2020 CLINICAL DATA:  Central venous catheter placement. EXAM: PORTABLE CHEST 1 VIEW COMPARISON:  Chest radiograph 02/16/2020. FINDINGS: Right IJ central venous catheter tip projects over the superior vena cava. Pacer apparatus overlies the left hemithorax. Stable cardiomegaly. Aortic atherosclerosis. Similar-appearing diffuse bilateral airspace opacities. Possible small left pleural effusion. No pneumothorax. Aortic valve replacement. IMPRESSION: Right IJ  central venous catheter tip projects over the superior vena cava. Similar-appearing diffuse bilateral airspace opacities. Electronically Signed   By: Lovey Newcomer M.D.   On: 02/16/2020 07:55   DG Chest Portable 1 View  Result Date: 02/16/2020 CLINICAL DATA:  Central venous catheter placed EXAM: PORTABLE CHEST 1 VIEW COMPARISON:  02/19/2020 FINDINGS: Right internal jugular central venous catheter is in place with its tip within the superior right atrium. No pneumothorax. Pulmonary insufflation has diminished resulting in vascular crowding at the hila. No pneumothorax. Possible tiny left pleural effusion. Coronary artery bypass grafting has been performed. Transcatheter aortic valve noted. Mild cardiomegaly is present. Left subclavian single lead pacemaker is seen with its lead overlying the expected right ventricle. IMPRESSION: Right internal jugular central venous catheter tip within the superior right atrium. No pneumothorax. Electronically Signed   By: Fidela Salisbury MD   On: 02/16/2020 05:07    Scheduled Meds: . sodium chloride   Intravenous Once  . sodium chloride   Intravenous Once  . allopurinol  100 mg Oral Daily  . docusate sodium  100 mg Oral Daily  . insulin aspart  0-15 Units Subcutaneous Q4H  . methylPREDNISolone (SOLU-MEDROL) injection  60 mg Intravenous Q12H  . pantoprazole (PROTONIX) IV  40 mg Intravenous Q24H  . tamsulosin  0.4 mg Oral Daily   Continuous Infusions: . sodium chloride    . sodium chloride    . ceFEPime (MAXIPIME) IV    . metronidazole Stopped (02/16/20 1151)  . phenylephrine (NEO-SYNEPHRINE) Adult infusion    . [START ON 02/17/2020] remdesivir 100 mg in NS 100 mL    . vancomycin 1,000 mg (02/16/20 1152)    Assessment/Plan:  1. Septic shock present on admission with lactic acidosis going up to 6.6.  With fever up to 100.8, tachypnea, leukopenia and tachycardia.  Patient also had initial hypotension, acute metabolic encephalopathy, acute kidney injury and acute  hypoxic respiratory failure.  Pressors if needed.  Empiric antibiotics with cefepime Flagyl and vancomycin.  Follow-up cultures. 2. Acute on chronic hypoxic respiratory failure secondary to COVID-19.  Patient on heated high flow nasal cannula 73% oxygen 50 L flow.  Change Decadron over to Solu-Medrol.  Continue remdesivir. 3. Acute metabolic encephalopathy likely secondary to COVID and septic shock.  Continue to monitor.  CT scan of the head negative. 4. Acute kidney injury.  Creatinine around 1.16.  Creatinine 1.7 upon admission and up to 1.92 currently.  Continue to monitor daily. 5. Myelodysplastic syndrome.  Initial  hemoglobin 5.7.  Patient has leukopenia and thrombocytopenia.  Patient received 2 units of packed red blood cells and as per nursing staff and other 1 was ordered.  Check another CBC now. 6. Hematochezia.  Holding Eliquis and continue to monitor 7. Chronic systolic congestive heart failure continue to monitor closely 8. Elevated troponin likely demand ischemia 9. Chronic atrial fibrillation, history of TAVR 10. Type 2 diabetes on sliding scale insulin 11. Stage I sacral decubiti present on admission, see description below  Pressure Injury 02/16/20 Sacrum Mid Stage 1 -  Intact skin with non-blanchable redness of a localized area usually over a bony prominence. (Active)  02/16/20 1000  Location: Sacrum  Location Orientation: Mid  Staging: Stage 1 -  Intact skin with non-blanchable redness of a localized area usually over a bony prominence.  Wound Description (Comments):   Present on Admission: Yes       Code Status:     Code Status Orders  (From admission, onward)         Start     Ordered   02/14/2020 2149  Full code  Continuous        02/10/2020 2151        Code Status History    Date Active Date Inactive Code Status Order ID Comments User Context   01/26/2020 1619 02/04/2020 2326 Full Code [id number]  Rhetta Mura, DO ED   12/07/2019 1334 12/12/2019 2209 Full  Code [id number]  Mitzi Hansen, MD ED   05/09/2017 1240 05/11/2017 1803 Full Code [id number]  Sherren Mocha, MD Inpatient   03/23/2017 1327 03/24/2017 1435 Full Code [id number]  Constance Haw, MD Inpatient   03/23/2017 1323 03/23/2017 1327 Full Code [id number]  Sherren Mocha, MD Inpatient   08/24/2015 2222 08/26/2015 1548 Full Code [id number]  Ivor Costa, MD ED   05/28/2012 1407 05/29/2012 1415 Full Code [id number]  Gabriel Earing, PA-C Inpatient   Advance Care Planning Activity     Family Communication: Spoke with wife on the phone Disposition Plan: Status is: Inpatient  Dispo: The patient is from: Home              Anticipated d/c is to: To be determined              Anticipated d/c date is: Likely will need to wee care in the hospital              Patient currently in the stepdown unit with severe sepsis with septic shock.   Difficult to place patient: Yes if we will require rehab, no if able to go home  Time spent: 32 minutes  [place]

## 2020-02-16 NOTE — Progress Notes (Addendum)
PHARMACY - PHYSICIAN COMMUNICATION CRITICAL VALUE ALERT - BLOOD CULTURE IDENTIFICATION (BCID)  Addendum: Reviewed with Dr. Tommy Medal. 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 Orders placed for CTX 2g q12h in addition to Amp 2g IV q6h. Dr. Tommy Medal will drop note to follow.  Evan [name] is an 80 y.o. male who presented to [facility] on 01/31/2020 with a chief complaint of SOB, weakness, and acute hypoxic respiratory failure with c/f sepsis and found covid +.   Assessment:  Covid+. BCID resulted with 4 of 4 vials (2 ana / 2 aer) that grew Enterococcus Faecalis w/ no resistance mechanisms . No PCN allergy for the patient,   + Will narrow to Ampicillin 2g IV q6h (per current renal function) + Add Ceftriaxone 2g IV q24h (for dual beta-lactam coverage of E.Faecalis endocarditis)  Reviewed BCID results with: Dr. Leslye Peer  Current antibiotics: VAN/CFP/MTZ   Changes to prescribed antibiotics recommended:  Recommendations accepted by provider   VAN/CFP/MTZ --> Ampicillin 2g IV q6H (per current renal fxn)  Results for orders placed or performed during the hospital encounter of 02/22/2020  Blood Culture ID Panel (Reflexed) (Collected: 02/16/2020  2:11 AM)  Result Value Ref Range   Enterococcus faecalis DETECTED (A) NOT DETECTED   Enterococcus Faecium NOT DETECTED NOT DETECTED   Listeria monocytogenes NOT DETECTED NOT DETECTED   Staphylococcus species NOT DETECTED NOT DETECTED   Staphylococcus aureus (BCID) NOT DETECTED NOT DETECTED   Staphylococcus epidermidis NOT DETECTED NOT DETECTED   Staphylococcus lugdunensis NOT DETECTED NOT DETECTED   Streptococcus species NOT DETECTED NOT DETECTED   Streptococcus agalactiae NOT DETECTED NOT DETECTED   Streptococcus pneumoniae NOT DETECTED NOT DETECTED   Streptococcus pyogenes NOT DETECTED NOT DETECTED   A.calcoaceticus-baumannii NOT DETECTED NOT DETECTED   Bacteroides fragilis NOT DETECTED NOT  DETECTED   Enterobacterales NOT DETECTED NOT DETECTED   Enterobacter cloacae complex NOT DETECTED NOT DETECTED   Escherichia coli NOT DETECTED NOT DETECTED   Klebsiella aerogenes NOT DETECTED NOT DETECTED   Klebsiella oxytoca NOT DETECTED NOT DETECTED   Klebsiella pneumoniae NOT DETECTED NOT DETECTED   Proteus species NOT DETECTED NOT DETECTED   Salmonella species NOT DETECTED NOT DETECTED   Serratia marcescens NOT DETECTED NOT DETECTED   Haemophilus influenzae NOT DETECTED NOT DETECTED   Neisseria meningitidis NOT DETECTED NOT DETECTED   Pseudomonas aeruginosa NOT DETECTED NOT DETECTED   Stenotrophomonas maltophilia NOT DETECTED NOT DETECTED   Candida albicans NOT DETECTED NOT DETECTED   Candida auris NOT DETECTED NOT DETECTED   Candida glabrata NOT DETECTED NOT DETECTED   Candida krusei NOT DETECTED NOT DETECTED   Candida parapsilosis NOT DETECTED NOT DETECTED   Candida tropicalis NOT DETECTED NOT DETECTED   Cryptococcus neoformans/gattii NOT DETECTED NOT DETECTED   Vancomycin resistance NOT DETECTED NOT DETECTED    Shanon Brow Akili Corsetti 02/16/2020  1:17 PM

## 2020-02-16 NOTE — Progress Notes (Signed)
Pharmacy Antibiotic Note  Evan [name] is a 80 y.o. male admitted on 02/18/2020 with sepsis.  Pharmacy has been consulted for Vancomycin and Cefepime dosing.  Plan: Cefepime 2gm IV q12hrs Vancomycin 1000 mg IV Q 24 hrs. Goal AUC 400-550. Expected AUC: 519.4 SCr used: 1.7, Css min predicted 13.9   Height: 6' (182.9 cm) Weight: 104 kg (229 lb 4.5 oz) IBW/kg (Calculated) : 77.6  Temp (24hrs), Avg:99.6 F (37.6 C), Min:98.7 F (37.1 C), Max:100.8 F (38.2 C)  Recent Labs  Lab 02/10/2020 1907 02/16/20 0047  WBC 3.2*  --   CREATININE 1.70*  --   LATICACIDVEN 2.1* 6.6*    Estimated Creatinine Clearance: 44.7 mL/min (A) (by C-G formula based on SCr of 1.7 mg/dL (H)).    Allergies  Allergen Reactions  . Niacin Other (See Comments) and Rash    Red face and burns.  . Niacin And Related Rash and Other (See Comments)    Red face and burns.  . Metformin And Related Diarrhea  . Statins Other (See Comments)    Myalgias, weakness   Antimicrobials this admission:   >>    >>   Dose adjustments this admission:   Microbiology results:  BCx:   UCx:    Sputum:    MRSA PCR:   Thank you for allowing pharmacy to be a part of this patient's care.  Hart Robinsons A 02/16/2020 5:26 AM

## 2020-02-16 NOTE — Progress Notes (Signed)
      INFECTIOUS DISEASE ATTENDING ADDENDUM:   Date: 02/16/2020  Patient name: Evan [name]  Medical record number: [id number]  Date of birth: 08/05/1940   I was alerted to patient's + coccus faecalis cultures from the [facility] system  Case reviewed with Dr. Leslye Peer  And Darrin Luis, Pharm D  Evan [name] is a 80 year old man with multiple medical problems including myelodysplastic syndrome, systolic heart failure, TAVR, atrial fibrillation, HTN, DM CKD, cirrhosis who was hospitalized in early January with respiratory failure due to systolic heart failure complicated by gout flare.  He now admitted to the hospital with confusion and weakness.  He was found to be in multiorgan failure, with multiple metabolic abnormalities.  COVID-19 test was positive and he has been started on corticosteroids and remdesivir.  He is now on high flow oxygen.  Blood cultures were taken and he was started on Comycin cefepime and metronidazole.  Blood cultures of subsequently yielded Enterococcus faecalis.  I agree that he should be treated presumptively for prosthetic valve endocarditis  Rather than using an aminoglycoside I prefer to do dual beta-lactam therapy and I will add ceftriaxone 2g IV q 12 hours to his high dose AMP  Source of an edge of his enterococcal bacteremia is unclear but he could have had translocation of it flora across the gut in particular given his cirrhosis.  Repeat blood cultures will be taken.  Transthoracic echocardiogram is being ordered.  Dr Delaine Lame will see the patient formally and in person tomorrow.   Alcide Evener 02/16/2020, 4:06 PM

## 2020-02-16 NOTE — Progress Notes (Signed)
HFNC initiated at 60% & 45 lpm. O2 sat 97.

## 2020-02-16 NOTE — Progress Notes (Signed)
Remdesivir - Pharmacy Brief Note   A/P:  Remdesivir 200 mg IVPB once followed by 100 mg IVPB daily x 4 days.   Antania Hoefling, PharmD Clinical Pharmacist  

## 2020-02-16 NOTE — Progress Notes (Signed)
Initial Nutrition Assessment  DOCUMENTATION CODES:   Obesity unspecified  INTERVENTION:   Nepro Shake po TID, each supplement provides 425 kcal and 19 grams protein  MVI daily   NUTRITION DIAGNOSIS:   Increased nutrient needs related to catabolic illness (COVID 19, CHF, Myelodysplastic syndrome, cirrhosis) as evidenced by estimated needs.  GOAL:   Patient will meet greater than or equal to 90% of their needs  MONITOR:   PO intake,Supplement acceptance,Labs,Weight trends,I & O's,Skin  REASON FOR ASSESSMENT:   Malnutrition Screening Tool    ASSESSMENT:   80 y.o. 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 fib on Eliquis, type 2 diabetes, HTN, CKD 3a, cirrhosis and gout, recently hospitalized from 1/2-1/11 with respiratory failure secondary to systolic heart failure complicated by acute gout flare in right knee requiring arthrocentesis who presents to the emergency room with poor oral intake, confusion and protracted weakness and was found to have COVID 19   RD working remotely.  Per chart, pt with poor appetite and oral intake pta. Pt was eating fairly well during his last admit. RD will add supplements and MVI to help pt meet his estimated needs. Per chart, pt is down 46lbs(17%) in two months; this is severe weight loss. RD will attempt to obtain nutrition related history and exam at follow up.   Medications reviewed and include: allopurinol, colace, insulin, solu-medrol, protonix, omnipen, ceftriaxone  Labs reviewed: Na 152(H), BUN 75(H), creat 1.92(H)  wbc- 3.5(L), Hgb 6.8(L), Hct 22.0(L) cbgs- 197, 212, 243, 271 x 24hrs AIC 5.8(H)  NUTRITION - FOCUSED PHYSICAL EXAM: Unable to perform at this time   Diet Order:   Diet Order            Diet heart healthy/carb modified Room service appropriate? Yes; Fluid consistency: Thin   Diet effective now                EDUCATION NEEDS:   No education needs have been identified at this time  Skin:  Skin Assessment: Reviewed RN Assessment (ecchymosis, Stage I sacrum)  Last BM:  PTA  Height:   Ht Readings from Last 1 Encounters:  2020/02/28 6' (1.829 m)    Weight:   Wt Readings from Last 1 Encounters:  02/28/2020 104 kg    Ideal Body Weight:  80.9 kg  BMI:  Body mass index is 31.1 kg/m.  Estimated Nutritional Needs:   Kcal:  2300-2600kcal/day  Protein:  115-130g/day  Fluid:  2.0L/day  Koleen Distance MS, RD, LDN Please refer to [facility] for RD and/or RD on-call/weekend/after hours pager

## 2020-02-16 NOTE — Progress Notes (Addendum)
  Following admission, patient became hypoxic requiring up to 6 L was hypotensive.  Lactic acid returned at 6, up from 2.1.  Decision made to call code sepsis  Impending shock, type uncertain, possibly septic/cardiogenic/related to severe anemia; -Criteria: Acute hypoxic respiratory failure Acute metabolic encephalopathy Acute kidney injury Hypotension and lactic acidosis, though no fever or leukocytosis - Code sepsis called and bolus initiated with close monitoring for worsening if cardiogenic. - We will treat as sepsis of unknown source for now --Low threshold for vasopressors - Upgraded to stepdown - Critical care consult. Discussed with E-link intensivist, Dr. Levada Schilling.   Acute on chronic hypoxic respiratory failure Possible COVID hypoxia,  - Treatment started with remdesivir and dexamethasone - Other acute medical conditions: - Please refer to recently dictated H&P  Patient is critically ill.  Prognosis continues to be poor Spoke with wife earlier.  Unable to get her on the phone to update her on patient's condition

## 2020-02-16 NOTE — Progress Notes (Signed)
HFNC re-titrated due to sats falling into mid-80's. Pt appears to mouth breathe when asleep. FIO2 adjusted to 90% & flow to 50 lpm. O2 sat now 92-95%.

## 2020-02-16 NOTE — Progress Notes (Signed)
2nd LA delayed only due to need for c-line, cvp and bolus, communication took place between [place] and bedside ED RN

## 2020-02-16 NOTE — Progress Notes (Signed)
Patient ID: SUHAN PACI, male   DOB: 19-Dec-1940, 80 y.o.   MRN: [id number]  Spoke to the patient's wife that he has positive Enterococcus in the blood cultures.  She mentioned that he is going to be followed by palliative but is a full code currently.  He did not do well currently with the swallow evaluation.  She is asking to visit.  Patient is a COVID-positive patient.  I did speak with the nursing supervisor to look into visiting or not.  I ordered palliative care for tomorrow morning Patient full code for now  Dr. Loletha Grayer

## 2020-02-16 NOTE — Progress Notes (Signed)
CODE SEPSIS - PHARMACY COMMUNICATION  **Broad Spectrum Antibiotics should be administered within 1 hour of Sepsis diagnosis**  Time Code Sepsis Called/Page Received: 0142  Antibiotics Ordered: Vancomycin, Cefepime and Flagyl  Time of 1st antibiotic administration: 0233  Additional action taken by pharmacy: n/a  If necessary, Name of Provider/Nurse Contacted: n/a   Ena Dawley ,PharmD Clinical Pharmacist  02/16/2020  3:01 AM

## 2020-02-16 NOTE — ED Notes (Signed)
Admitting physician  Damita Dunnings notified O2 saturation 87-90 on 4L

## 2020-02-16 NOTE — Evaluation (Signed)
Clinical/Bedside Swallow Evaluation Patient Details  Name: Evan [name] MRN: [id number] Date of Birth: 1940/05/24  Today's Date: 02/16/2020 Time: SLP Start Time (ACUTE ONLY): 1455 SLP Stop Time (ACUTE ONLY): 1555 SLP Time Calculation (min) (ACUTE ONLY): 60 min  Past Medical History:  Past Medical History:  Diagnosis Date  . Anemia   . Carotid arterial disease ([place])   . CHF (congestive heart failure) ([place])   . Coronary artery disease   . Hearing loss   . HOH (hard of hearing)   . Hyperlipidemia   . Hypertension   . Nocturia   . Obesity   . Osteoarthritis    "BUE; BLE; right knee" (03/23/2017)  . Peripheral vision loss, bilateral    S/P 07/2015  . Permanent atrial fibrillation ([place])   . 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 Severe aortic stenosis   . Sinus drainage   . Stroke [facility]) 07/2015   "peripheral vision still bad out of left eye" (03/23/2017)  . Torn rotator cuff 2012   Right arm  . Type II diabetes mellitus ([place])   . Urinary frequency    Past Surgical History:  Past Surgical History:  Procedure Laterality Date  . BACK SURGERY    . CARDIAC CATHETERIZATION  11/29/2005   SEVERE THREE VESSEL OBSTRUCTIVE ATHERSCLEROTIC CAD. ALL GRAFTS WERE PATENT. EF 65%  . CARDIAC CATHETERIZATION  03/23/2017  . CAROTID ENDARTERECTOMY Right 01-29-08   cea  . CARPAL TUNNEL RELEASE Right   . COLONOSCOPY N/A 12/10/2019   Procedure: COLONOSCOPY;  Surgeon: Wilford Corner, MD;  Location: [facility] ENDOSCOPY;  Service: Endoscopy;  Laterality: N/A;  . CORONARY ARTERY BYPASS GRAFT  03/2003   X3. LIMA GRAFT TO THE LAD, SAPHENOUS VEIN GRAFT TO THE PA, AND A LEFT RADIAL GRAFT TO THEOBTUSE MARGINAL VESSEL  . ENDARTERECTOMY Left 05/28/2012   Procedure: ENDARTERECTOMY CAROTID;  Surgeon: Elam Dutch, MD;  Location: [place];  Service: Vascular;  Laterality: Left;  . ESOPHAGOGASTRODUODENOSCOPY N/A 12/10/2019   Procedure: ESOPHAGOGASTRODUODENOSCOPY (EGD);  Surgeon: Wilford Corner, MD;   Location: [place];  Service: Endoscopy;  Laterality: N/A;  . HEMOSTASIS CLIP PLACEMENT  12/10/2019   Procedure: HEMOSTASIS CLIP PLACEMENT;  Surgeon: Wilford Corner, MD;  Location: [place] ENDOSCOPY;  Service: Endoscopy;;  . INSERT / REPLACE / REMOVE PACEMAKER  03/23/2017  . [place]; 1984  . MOLE SURGERY     "? face/arms"  . PACEMAKER IMPLANT N/A 03/23/2017   Procedure: PACEMAKER IMPLANT;  Surgeon: Constance Haw, MD;  Location: [place] CV LAB;  Service: Cardiovascular;  Laterality: N/A;  . PATCH ANGIOPLASTY Left 05/28/2012   Procedure: WITH dACRON PATCH ANGIOPLASTY;  Surgeon: Elam Dutch, MD;  Location: [place];  Service: Vascular;  Laterality: Left;  . POLYPECTOMY  12/10/2019   Procedure: POLYPECTOMY;  Surgeon: Wilford Corner, MD;  Location: [facility] ENDOSCOPY;  Service: Endoscopy;;  . RIGHT/LEFT HEART CATH AND CORONARY/GRAFT ANGIOGRAPHY N/A 03/23/2017   Procedure: RIGHT/LEFT HEART CATH AND CORONARY/GRAFT ANGIOGRAPHY;  Surgeon: Sherren Mocha, MD;  Location: [place] CV LAB;  Service: Cardiovascular;  Laterality: N/A;  . TEE WITHOUT CARDIOVERSION N/A 05/09/2017   Procedure: TRANSESOPHAGEAL ECHOCARDIOGRAM (TEE);  Surgeon: Sherren Mocha, MD;  Location: [place];  Service: Open Heart Surgery;  Laterality: N/A;  . TRANSCATHETER AORTIC VALVE REPLACEMENT, TRANSFEMORAL N/A 05/09/2017   Procedure: TRANSCATHETER AORTIC VALVE REPLACEMENT, TRANSFEMORAL using an Edwards 37mm Sapien 3 Aortic Valve;  Surgeon: Sherren Mocha, MD;  Location: [place];  Service: Open Heart Surgery;  Laterality: N/A;  HPI:  Pt is a 80 y.o. male who presents the emergency department via EMS from home for complaint of increased altered mental status, weakness, decreased intake of fluids and solids.  Patient was admitted to the hospital  on 01/26/2020.  Patient was discharged on 01/11.  Patient been admitted for CHF, weakness to the point of being unable to stand or ambulate, and gout.  Patient had been  admitted for 9 days.  Per the wife, patient had been weak when his discharge but had been relatively his normal self.  Patient's wife is the primary historian states that over the past week the patient has become increasingly weak, is having decreased intake of solids and liquids.  The patient's wife states that the peripheral edema the patient presented with on the second of this month has had significant decrease.  The wife reports that the patient is now actively losing weight but over the past several days has been too weak to stand adequately check weights at home.  The wife states that the patient has been taking his prescriptions up until this morning at which time he was unable to take his prescriptions.  Patient has had decreased verbalization to the wife and the family.  The wife states that the patient is primarily communicating through yes/no questions or pointing at items.  Patient would answer yes no questions for me and denied any headache, chest pain, sensation of shortness of breath, abdominal pain, nausea.  Patient denied any musculoskeletal complaints.  Again this was using yes/no questions.  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 fib, CVA, diabetes, cirrhosis.  CXR: Mild congestive heart failure, without significant change since  previous.  Head CT: No acute intracranial abnormalities.  Pt is poorly responsive at this evaluation.   Assessment / Plan / Recommendation Clinical Impression  Pt appears to present w/ significant oropharyngeal phase dysphagia hampered by Significantly reduced Cognitive status and little to no oro-lingual movements during bolus stim, acceptance, or management. W/ all bolus trials of ice chips, pt exhibited continued rote lingual pumping movements and did not respond to the presentation of the ice chips boluses showing awareness w/  appropriate oral prep and attention. MAX verbal/tactile cues given to encourage jaw/labial closing to focus on A-P transfer and swallowing w/ each trial. Slight to no pharyngeal phase response w/ swallow was noted x1 to the melted ice chip trials; laryngeal excursion was reduced upon palpation. Swallow appeared incomplete. Oral phase presentation included poor lingual manipulation of boluses, no controlled A-P transfer, and bolus material laying orally in floor of mouth(swab used to remove as well). Pt presented w/ a slight mouth open posture at rest but was able to achieve labial closure w/ tactile cues intermittently, OM exam incomplete; no unilateral weakness noted. He responsed by pulling away to increased oral stim by gloved finger of SLP. Noted a severely Dried tongue surface and oral debris in oral cavity during oral care provided by SLP. No further po's attempted secondary to pt's presentation and increased risk for aspiration/choking. Recommend strict NPO status w/ frequent oral care for hygiene and stimulation of swallowing next 2-3 days. MD consulted re: above and pt's NPO status; need for time to improve b/f reattempting po's. Also noted pt's pupils were pinpoint - MD informed. Pt nonverbal and did not follow commands. NSG updated. ST services will f/u w/ ongoing oral assessment/awareness stimulation for swallowing in hopes to establish and  oral diet next few days. Dietician f/u recommended for support. SLP Visit Diagnosis: Dysphagia, oropharyngeal phase (R13.12) (Cognitive decline)    Aspiration Risk  Severe aspiration risk;Risk for inadequate nutrition/hydration    Diet Recommendation  NPO status w/ frequent oral care for hygiene and stimulation of swallowing; aspiration precautions  Medication Administration: Via alternative means    Other  Recommendations Recommended Consults:  (Dietician f/u; Palliative Care f/u) Oral Care Recommendations: Oral care QID;Staff/trained caregiver to provide  oral care (precautions) Other Recommendations:  (TBD)   Follow up Recommendations  (TBD)      Frequency and Duration  (TBD)   (TBD)       Prognosis Prognosis for Safe Diet Advancement: Guarded Barriers to Reach Goals: Cognitive deficits;Time post onset;Severity of deficits;Behavior      Swallow Study   General Date of Onset: 01/29/2020 HPI: Pt is a 80 y.o. male who presents the emergency department via EMS from home for complaint of increased altered mental status, weakness, decreased intake of fluids and solids.  Patient was admitted to the hospital  on 01/26/2020.  Patient was discharged on 01/11.  Patient been admitted for CHF, weakness to the point of being unable to stand or ambulate, and gout.  Patient had been admitted for 9 days.  Per the wife, patient had been weak when his discharge but had been relatively his normal self.  Patient's wife is the primary historian states that over the past week the patient has become increasingly weak, is having decreased intake of solids and liquids.  The patient's wife states that the peripheral edema the patient presented with on the second of this month has had significant decrease.  The wife reports that the patient is now actively losing weight but over the past several days has been too weak to stand adequately check weights at home.  The wife states that the patient has been taking his prescriptions up until this morning at which time he was unable to take his prescriptions.  Patient has had decreased verbalization to the wife and the family.  The wife states that the patient is primarily communicating through yes/no questions or pointing at items.  Patient would answer yes no questions for me and denied any headache, chest pain, sensation of shortness of breath, abdominal pain, nausea.  Patient denied any musculoskeletal complaints.  Again this was using yes/no questions.  Patient with a history of myelodysplastic syndrome complicated by chronic  anemia, anemia requiring frequent blood transfusions, MDS, CAD, CHF, hyperlipidemia, hypertension, A. fib, CVA, diabetes, cirrhosis.  CXR: Mild congestive heart failure, without significant change since  previous.  Head CT: No acute intracranial abnormalities.  Pt is poorly responsive at this evaluation. Type of Study: Bedside Swallow Evaluation Previous Swallow Assessment: none noted Diet Prior to this Study: Regular;Thin liquids Temperature Spikes Noted: Yes (wbc 3.5) Respiratory Status: Nasal cannula (HFNC 50%) History of Recent Intubation: No Behavior/Cognition: Lethargic/Drowsy;Doesn't follow directions Oral Cavity Assessment: Dry;Dried secretions (debris) Oral Care Completed by SLP: Yes Oral Cavity - Dentition: Edentulous Vision:  (n/a) Self-Feeding Abilities: Total assist Patient Positioning: Upright in bed (needed support) Baseline Vocal Quality:  (nonverbal) Volitional Cough: Cognitively unable to elicit Volitional Swallow: Unable to elicit    Oral/Motor/Sensory Function Overall Oral Motor/Sensory Function: Generalized oral weakness Facial Symmetry: Within Functional Limits Lingual Symmetry: Abnormal symmetry right   Ice Chips Ice chips: Impaired Presentation:  (gloved finger: 3 trials) Oral Phase Impairments: Reduced labial seal;Reduced lingual movement/coordination;Poor awareness of bolus Oral Phase Functional Implications: Oral holding Pharyngeal  Phase Impairments:  (no pharyngeal swallow appreciated) Other Comments: rote behaviors of lingual pumping   Thin Liquid Thin Liquid: Not tested    Nectar Thick Nectar Thick Liquid: Not tested   Honey Thick Honey Thick Liquid: Not tested   Puree Puree: Not tested   Solid     Solid: Not tested        Orinda Kenner, MS, CCC-SLP Speech Language Pathologist Rehab Services [phone] [facility] 02/16/2020,4:12 PM

## 2020-02-16 NOTE — Progress Notes (Signed)
Elink following for sepsis protocol. 

## 2020-02-16 NOTE — Consult Note (Signed)
NAME:  Evan [name], MRN:  [id number], DOB:  02-Aug-1940, LOS: 1 ADMISSION DATE:  02/16/2020, CONSULTATION DATE:  02/16/2020 REFERRING MD:  Dr. Damita Dunnings, CHIEF COMPLAINT: Altered Mental Status  Brief History:  29 yod male with altered mental status  History of Present Illness:  80 year old male presenting to the ED with poor p.o. take confusion and generalized weakness to the point of being unable to ambulate. Wife reported on admission that 5 days ago he started to decline. She denies that the patient has complained of fever, shortness of breath, chest pain, abdominal pain, vomiting or diarrhea. The wife reported he has a mild chronic cough at baseline.  Wife did report on admission that the patient had a few bloody bowel movements after taking Dulcolax for constipation. ED course: On initial vitals patient was afebrile, BP 119/76, HR 66, tachypneic in the 20's with SPO2 at 100% on 4 L nasal cannula.  Labs significant for: Hemoglobin 5.7 (was 8.1- 11 days prior), platelets 46,000 (reduced from 69,000), sodium 153, creatinine 1.77 (prior 1.16), ammonia 38, troponin 210, BNP 2000, COVID PCR positive.  CT head showed no acute abnormalities.  Patient was started on a blood transfusion 2 units PRBCs.  Hospitalist service consulted for admission. Patient remains roomed in emergency department while under hospitalist service pending admission.  Patient became hypoxic requiring 6 L nasal cannula.  BP also dropped with pressures in the high [id number] low 0000000 systolically maps [id number].  Lactic increased from 2.1-6.6.  Sepsis criteria initiated, PCCM consulted for further management and monitoring due to worsening respiratory status and possible impending shock.  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  When watching me was able to copy the command of "give me a thumbs up".  Otherwise, repeats yes/no somewhat inappropriately.  Labs/ Imaging personally reviewed  WBC/ TMAX: 3.2/ 37.8 Lactic/ PCT: 2.1 > 6.6 / 0.47  ABG: 7.48/40/55/29.8 CXR 02/22/2020: Mild persistent central vascular congestion, patchy left basilar airspace disease and small left pleural effusion  Objective   Blood pressure (!) 110/58, pulse 94, temperature 99.4 F (37.4 C), temperature source Oral, resp. rate (!) 26, height 6' (1.829 m), weight 104 kg, SpO2 91 %.       No intake or output data in the 24 hours ending 02/16/20 0233 Filed Weights   01/26/2020 1820  Weight: 104 kg    Examination: General: Adult male, critically ill, lying in bed, NAD HEENT: MM pink/moist, anicteric, atraumatic, neck supple Neuro: *A&O x 1, able to follow simple persistent commands, PERRL +3, MAE-generalized weakness CV: s1s2 RRR, paced sinus rhythm on monitor, no r/m/g Pulm: Regular, non labored on 6 L nasal cannula, breath sounds coarse crackles throughout GI: soft, rounded, non tender, bs x 4  GU: foley in place with clear yellow urine Skin: Scattered ecchymosis, limited exam- no rashes/lesions noted Extremities: warm/dry, pulses + 2 R/P, +2 edema noted BLE  Resolved Hospital Problem list      Assessment & Plan:  Acute hypoxic respiratory distress in the setting of COVID-19 Inflammatory markers are pending, PCT-0.47 -Consider heated high flow nasal cannula/BiPAP as needed to support respiratory status -Intermittent chest x-ray and ABG as needed -Continue supplemental O2 to maintain SPO2 > 88% at rest, > 85% with activity -Continue Decadron 6mg  IV x10days -Continue Remdesiver  - Consider prone positioning as tolerated -SCDs for VTE prophylaxis, anticoagulation currently on hold due to concerns of GI bleed on admission - Trend lactate/PCT - Cautious IV hydration  Sepsis-unknown source Lactic: 2.1> 6.6, Baseline PCT: 0.47, UA: Nitrite and leukocyte negative/many bacteria present, CXR: Mild central vascular congestion/small left pleural effusion/patchy left basilar airspace disease, CT head negative Initial interventions/workup included: 2 L of LR (reduced from 3.5 L, patient also receiving 2 units PRBCs & fluid overloaded on exam with worsening respiratory status) & Cefepime/ Vancomycin/ Metronidazole - Supplemental oxygen as needed, to maintain SpO2 > 88% at rest - f/u cultures, trend lactic/ PCT - Daily CBC - monitor WBC/ fever curve - IV antibiotics: cefepime & vancomycin & Flagyl -Cautious IVF hydration -CVP monitoring every 4 to assess fluid status - Consider vasopressors to maintain MAP< 65, phenylephrine drip due to history of A. fib - Strict I/O's: alert provider if UOP < 0.5 mL/kg/hr -continuous cardiac monitoring  Severe symptomatic anemia PMHx: Myelodysplastic syndrome Patient's hemoglobin on arrival was 5.7, down from 8.1.  2 units of packed red blood cells ordered and initiated by ED. Patient has received transfusions outpatient, followed by oncology.  Wife described some bloody bowel movements at home, patient on Eliquis for A. Fib-stopped on admission on 02/24/2020 due to concerns of GI bleed.  Platelets also reduced at 46 reduced from 69 at prior  check. -Will repeat CBC post 2 units PRBCs, consider platelets if continue downtrending - Monitor for s/s of bleeding - Daily CBC - Transfuse for Hgb <7 -Consider oncology consult if needed  Acute metabolic encephalopathy Multifactorial in the setting of symptomatic severe anemia, hyper ammonemia, sepsis & COVID-19 diagnosis PMHx: Cirrhosis Ammonia slightly elevated at 38, patient with confusion and poor p.o. take over the last 3 to 5 days per wife's report.  Head CT negative for acute abnormality on admission.   -Frequent neurological checks -Supportive care -Trend ammonia, consider lactulose as needed -Fall and aspiration precautions  HFpEF -LVEF 45 to 50% Elevated troponin: 210 > 219 BNP on admission 2000. Patient's lasix dose increased recently by PCP. Suspect demand ischemia. - CVP monitoring Q 4 to evaluate fluid status - cautious IVF resuscitation - consider diuresis as tolerated - continuous cardiac monitoring - strict I&O's, daily weights  Best practice (evaluated daily)  Diet: heart healthy carb modified as long as mental/respiratory status allows Pain/Anxiety/Delirium protocol (if indicated): N/A VAP protocol (if indicated): N/A DVT prophylaxis: SCDs due to concern of GI bleed GI prophylaxis: Protonix IV Glucose control: Monitor every 4 with sliding scale insulin Mobility: Bedrest, mobilize as tolerated Disposition: Stepdown  Goals of Care:  Last date of multidisciplinary goals of care discussion: 02/16/2020 Family and staff present: APP and wife via telephone conversation Summary of discussion: Discussed plan of care, including the need for central line placement.  Consent obtained, all questions and concerns answered Follow up goals of care discussion due: 02/17/2020 Code Status: Full  Labs  CBC: Recent Labs  Lab 02/09/2020 1907  WBC 3.2*  NEUTROABS 2.6  HGB 5.7*  HCT 18.9*  MCV 106.8*  PLT 46*    Basic Metabolic Panel: Recent Labs  Lab  02/07/2020 1907  NA 153*  K 3.6  CL 101  CO2 34*  GLUCOSE 213*  BUN 69*  CREATININE 1.70*  CALCIUM 8.7*  MG 2.6*   GFR: Estimated Creatinine Clearance: 44.7 mL/min (A) (by C-G formula based on SCr of 1.7 mg/dL (H)). Recent Labs  Lab 02/05/2020 1907 02/16/20 0047  WBC 3.2*  --   LATICACIDVEN 2.1* 6.6*    Liver Function Tests: Recent Labs  Lab 02/01/2020 1907  AST 32  ALT 25  ALKPHOS 107  BILITOT 4.2*  PROT 6.5  ALBUMIN 2.8*   No results for input(s): LIPASE, AMYLASE in the last 168 hours. Recent Labs  Lab 02/21/2020 1907  AMMONIA 38*    ABG    Component Value Date/Time   PHART 7.337 (L) 05/09/2017 1312   PCO2ART 46.0 05/09/2017 1312   PO2ART 158.0 (H) 05/09/2017 1312   HCO3 24.7 05/09/2017 1312   TCO2 26 05/09/2017 1312   ACIDBASEDEF 1.0 05/09/2017 1312   O2SAT 99.0 05/09/2017 1312     Coagulation Profile: No results for input(s): INR, PROTIME in the last 168 hours.  Cardiac Enzymes: No results for input(s): CKTOTAL, CKMB, CKMBINDEX, TROPONINI in the last 168 hours.  HbA1C: Hgb A1c MFr Bld  Date/Time Value Ref Range Status  12/07/2019 06:45 PM 6.6 (H) 4.8 - 5.6 % Final    Comment:    (NOTE) Pre diabetes:          5.7%-6.4%  Diabetes:              >6.4%  Glycemic control for   <7.0% adults with diabetes   05/05/2017 10:05 AM 6.8 (H) 4.8 - 5.6 % Final    Comment:    (NOTE) Pre diabetes:          5.7%-6.4% Diabetes:              >6.4% Glycemic control for   <7.0% adults with diabetes     CBG: Recent Labs  Lab 02/23/2020 2211  GLUCAP 195*    Review of Systems:   UTA -patient unable to provide history bedside  Past Medical History:  He,  has a past medical history of Anemia, Carotid arterial disease ([place]), CHF (congestive heart failure) ([place]), Coronary artery disease, Hearing loss, HOH (hard of hearing), Hyperlipidemia, Hypertension, Nocturia, Obesity, Osteoarthritis, Peripheral vision loss, bilateral, Permanent atrial fibrillation ([place]),  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   Surgical History:   Past Surgical History:  Procedure Laterality Date  . BACK SURGERY    . CARDIAC CATHETERIZATION  11/29/2005   SEVERE THREE VESSEL OBSTRUCTIVE ATHERSCLEROTIC CAD. ALL GRAFTS WERE PATENT. EF 65%  . CARDIAC CATHETERIZATION  03/23/2017  . CAROTID ENDARTERECTOMY Right 01-29-08   cea  . CARPAL TUNNEL RELEASE Right   . COLONOSCOPY N/A 12/10/2019   Procedure: COLONOSCOPY;  Surgeon: Wilford Corner, MD;  Location: [facility] ENDOSCOPY;  Service: Endoscopy;  Laterality: N/A;  . CORONARY ARTERY BYPASS GRAFT  03/2003   X3. LIMA GRAFT TO THE LAD, SAPHENOUS VEIN GRAFT TO THE PA, AND A LEFT RADIAL GRAFT TO THEOBTUSE MARGINAL VESSEL  . ENDARTERECTOMY Left 05/28/2012   Procedure: ENDARTERECTOMY CAROTID;  Surgeon: Elam Dutch, MD;  Location: [facility] OR;  Service: Vascular;  Laterality: Left;  . ESOPHAGOGASTRODUODENOSCOPY N/A 12/10/2019   Procedure: ESOPHAGOGASTRODUODENOSCOPY (EGD);  Surgeon: Wilford Corner, MD;  Location: [place];  Service: Endoscopy;  Laterality: N/A;  . HEMOSTASIS CLIP PLACEMENT  12/10/2019   Procedure: HEMOSTASIS CLIP PLACEMENT;  Surgeon: Wilford Corner, MD;  Location: [place] ENDOSCOPY;  Service: Endoscopy;;  . INSERT / REPLACE / REMOVE PACEMAKER  03/23/2017  . [place]; 1984  . MOLE SURGERY     "? face/arms"  . PACEMAKER IMPLANT N/A 03/23/2017   Procedure: PACEMAKER IMPLANT;  Surgeon: Constance Haw, MD;  Location: [place] CV LAB;  Service: Cardiovascular;  Laterality: N/A;  . PATCH ANGIOPLASTY Left 05/28/2012   Procedure: WITH dACRON PATCH ANGIOPLASTY;  Surgeon: Elam Dutch, MD;  Location: [place];  Service: Vascular;  Laterality: Left;  . POLYPECTOMY  12/10/2019   Procedure: POLYPECTOMY;  Surgeon: Wilford Corner, MD;  Location: [facility]  ENDOSCOPY;  Service: Endoscopy;;  . RIGHT/LEFT HEART CATH AND CORONARY/GRAFT ANGIOGRAPHY N/A 03/23/2017   Procedure: RIGHT/LEFT HEART CATH AND CORONARY/GRAFT ANGIOGRAPHY;  Surgeon: Sherren Mocha, MD;  Location: [place] CV LAB;  Service: Cardiovascular;  Laterality: N/A;  . TEE WITHOUT CARDIOVERSION N/A 05/09/2017   Procedure: TRANSESOPHAGEAL ECHOCARDIOGRAM (TEE);  Surgeon: Sherren Mocha, MD;  Location: [place];  Service: Open Heart Surgery;  Laterality: N/A;  . TRANSCATHETER AORTIC VALVE REPLACEMENT, TRANSFEMORAL N/A 05/09/2017   Procedure: TRANSCATHETER AORTIC VALVE REPLACEMENT, TRANSFEMORAL using an Edwards 22mm Sapien 3 Aortic Valve;  Surgeon: Sherren Mocha, MD;  Location: [place];  Service: Open Heart Surgery;  Laterality: N/A;     Social History:   reports that he quit smoking about 26 years ago. His smoking use included cigarettes. He has a 78.00 pack-year smoking history. He has never used smokeless tobacco. He reports that he does not drink alcohol and does not use drugs.   Family History:  His family history includes Arrhythmia in his brother; Cancer in his brother; Heart attack in his brother and father; Parkinsonism in his brother.   Allergies Allergies  Allergen Reactions  . Niacin Other (See Comments) and Rash    Red face and burns.  . Niacin And Related Rash and Other (See Comments)    Red face and burns.  . Metformin And Related Diarrhea  . Statins Other (See Comments)    Myalgias, weakness     Home Medications  Prior to Admission medications   Medication Sig Start Date End Date Taking? Authorizing Provider  acetaminophen (TYLENOL) 650 MG CR tablet Take 1,300 mg by mouth every 8 (eight) hours as needed for pain.     [provider]  allopurinol (ZYLOPRIM) 100 MG tablet Take 1 tablet (100 mg total) by mouth daily. 02/04/20 02/03/21  Pahwani, Michell Heinrich, MD  amLODipine (NORVASC) 2.5 MG tablet Take 1 tablet (2.5 mg total) by mouth daily. 11/27/19 02/25/20  [name],  [name] M, MD  apixaban (ELIQUIS) 5 MG TABS tablet Take 1 tablet (5 mg total) by mouth 2 (two) times daily. 01/09/20 07/07/20  Almyra Deforest, PA  benazepril (LOTENSIN) 40 MG tablet Take 40 mg by mouth daily. Patient not taking: Reported on 01/26/2020 01/20/20   [provider]  cyanocobalamin 1000 MCG tablet Take 1,000 mcg by mouth daily.     [provider]  docusate sodium (COLACE) 100 MG capsule Take 1 capsule (100 mg total) by mouth daily. 02/05/20   Pahwani, Michell Heinrich, MD  ezetimibe (ZETIA) 10 MG tablet TAKE 1 TABLET  BY MOUTH EVERY DAY Patient taking differently: Take 10 mg by mouth daily. 01/08/19   [name], [name] M, MD  furosemide (LASIX) 80 MG tablet Take 80 mg by mouth 2 (two) times daily.    [provider]  glimepiride (AMARYL) 2 MG tablet TAKE 1 TABLET (2 MG TOTAL) BY MOUTH DAILY WITH BREAKFAST Patient taking differently: Take 2 mg by mouth daily with breakfast. 01/25/18   [name], [name] M, MD  hydrALAZINE (APRESOLINE) 25 MG tablet Take 1 tablet (25 mg total) by mouth 2 (two) times daily. 06/10/19   [name], [name] M, MD  lenalidomide (REVLIMID) 5 MG capsule Take 1 capsule (5 mg total) by mouth daily. Fanny Dance # [id number]     Date Obtained 01/01/2020 Patient taking differently: Take 5 mg by mouth daily. [employer] Auth # [id number]     Date Obtained 01/01/2020 01/01/20   Cammie Sickle, MD  loratadine (CLARITIN) 10 MG tablet Take 10 mg by mouth daily.    [provider]  pantoprazole (PROTONIX) 40 MG tablet Take 1 tablet (40 mg total) by mouth daily. 01/14/20   [name], [name] M, MD  polyethylene glycol (MIRALAX / GLYCOLAX) 17 g packet Take 17 g by mouth daily. 02/04/20   Pahwani, Michell Heinrich, MD  Polyvinyl Alcohol-Povidone (REFRESH OP) Place 1 drop into both eyes daily as needed (for dry eyes).     [provider]  potassium chloride SA (KLOR-CON) 20 MEQ tablet Take 1 tablet (20 mEq total) by mouth 2 (two) times daily. 01/08/20   Almyra Deforest, PA  prochlorperazine  (COMPAZINE) 10 MG tablet Take 1 tablet (10 mg total) by mouth every 6 (six) hours as needed for nausea or vomiting. 01/06/20   Cammie Sickle, MD  tamsulosin (FLOMAX) 0.4 MG CAPS capsule TAKE 1 CAPSULE BY MOUTH ONCE DAILY Patient taking differently: Take 0.4 mg by mouth daily. 09/21/15   [name], [name] M, MD  traZODone (DESYREL) 50 MG tablet Take 50 mg by mouth at bedtime. Patient not taking: Reported on 01/26/2020 01/17/20   [provider]     Critical care time: 55 minutes       Venetia Night, AGACNP-BC Acute Care Nurse Practitioner [place]   [phone] / [phone] Please see Amion for pager details.

## 2020-02-16 NOTE — ED Notes (Signed)
Advised nurse that patient has assigned bed 

## 2020-02-16 NOTE — ED Notes (Addendum)
Assisted ICU NP with insertion of central line at bedside

## 2020-02-17 ENCOUNTER — Inpatient Hospital Stay: Payer: Medicare Other

## 2020-02-17 ENCOUNTER — Telehealth: Payer: Self-pay | Admitting: Internal Medicine

## 2020-02-17 ENCOUNTER — Inpatient Hospital Stay
Admit: 2020-02-17 | Discharge: 2020-02-17 | Disposition: A | Discharge status: Home / Self Care | Payer: Medicare Other | Attending: Internal Medicine | Admitting: Internal Medicine

## 2020-02-17 DIAGNOSIS — G9341 Metabolic encephalopathy: Secondary | ICD-10-CM

## 2020-02-17 DIAGNOSIS — J9601 Acute respiratory failure with hypoxia: Secondary | ICD-10-CM

## 2020-02-17 DIAGNOSIS — I4821 Permanent atrial fibrillation: Secondary | ICD-10-CM

## 2020-02-17 DIAGNOSIS — R7881 Bacteremia: Secondary | ICD-10-CM | POA: Diagnosis not present

## 2020-02-17 DIAGNOSIS — E872 Acidosis: Secondary | ICD-10-CM | POA: Diagnosis not present

## 2020-02-17 DIAGNOSIS — U071 COVID-19: Secondary | ICD-10-CM | POA: Diagnosis not present

## 2020-02-17 DIAGNOSIS — J1282 Pneumonia due to coronavirus disease 2019: Secondary | ICD-10-CM

## 2020-02-17 DIAGNOSIS — B952 Enterococcus as the cause of diseases classified elsewhere: Secondary | ICD-10-CM | POA: Diagnosis not present

## 2020-02-17 DIAGNOSIS — G9340 Encephalopathy, unspecified: Secondary | ICD-10-CM

## 2020-02-17 DIAGNOSIS — N179 Acute kidney failure, unspecified: Secondary | ICD-10-CM

## 2020-02-17 DIAGNOSIS — A419 Sepsis, unspecified organism: Secondary | ICD-10-CM | POA: Diagnosis not present

## 2020-02-17 DIAGNOSIS — E876 Hypokalemia: Secondary | ICD-10-CM

## 2020-02-17 DIAGNOSIS — J9600 Acute respiratory failure, unspecified whether with hypoxia or hypercapnia: Secondary | ICD-10-CM

## 2020-02-17 DIAGNOSIS — M109 Gout, unspecified: Secondary | ICD-10-CM

## 2020-02-17 LAB — PREPARE FRESH FROZEN PLASMA

## 2020-02-17 LAB — ECHOCARDIOGRAM COMPLETE
AR max vel: 1.72 cm2
AV Area VTI: 1.6 cm2
AV Area mean vel: 1.79 cm2
AV Mean grad: 9 mmHg
AV Peak grad: 20.6 mmHg
Ao pk vel: 2.27 m/s
Area-P 1/2: 3.71 cm2
Height: 72 in
MV VTI: 1.51 cm2
S' Lateral: 4.74 cm
Weight: 3668.45 oz

## 2020-02-17 LAB — C-REACTIVE PROTEIN: CRP: 20.5 mg/dL — ABNORMAL HIGH (ref <1.0)

## 2020-02-17 LAB — CBC
HCT: 26.6 % — ABNORMAL LOW (ref 39.0–52.0)
Hemoglobin: 8.4 g/dL — ABNORMAL LOW (ref 13.0–17.0)
MCH: 29.3 pg (ref 26.0–34.0)
MCHC: 31.6 g/dL (ref 30.0–36.0)
MCV: 92.7 fL (ref 80.0–100.0)
Platelets: 34 10*3/uL — ABNORMAL LOW (ref 150–400)
RBC: 2.87 MIL/uL — ABNORMAL LOW (ref 4.22–5.81)
RDW: 25.4 % — ABNORMAL HIGH (ref 11.5–15.5)
WBC: 4.9 10*3/uL (ref 4.0–10.5)
nRBC: 2.6 % — ABNORMAL HIGH (ref 0.0–0.2)

## 2020-02-17 LAB — TYPE AND SCREEN
ABO/RH(D): A POS
Antibody Screen: NEGATIVE
Unit division: 0
Unit division: 0
Unit division: 0

## 2020-02-17 LAB — COMPREHENSIVE METABOLIC PANEL
ALT: 22 U/L (ref 0–44)
AST: 35 U/L (ref 15–41)
Albumin: 2.3 g/dL — ABNORMAL LOW (ref 3.5–5.0)
Alkaline Phosphatase: 82 U/L (ref 38–126)
Anion gap: 15 (ref 5–15)
BUN: 74 mg/dL — ABNORMAL HIGH (ref 8–23)
CO2: 34 mmol/L — ABNORMAL HIGH (ref 22–32)
Calcium: 8.3 mg/dL — ABNORMAL LOW (ref 8.9–10.3)
Chloride: 109 mmol/L (ref 98–111)
Creatinine, Ser: 1.81 mg/dL — ABNORMAL HIGH (ref 0.61–1.24)
GFR, Estimated: 38 mL/min — ABNORMAL LOW (ref >60)
Glucose, Bld: 127 mg/dL — ABNORMAL HIGH (ref 70–99)
Potassium: 2.9 mmol/L — ABNORMAL LOW (ref 3.5–5.1)
Sodium: 158 mmol/L — ABNORMAL HIGH (ref 135–145)
Total Bilirubin: 3.8 mg/dL — ABNORMAL HIGH (ref 0.3–1.2)
Total Protein: 5.8 g/dL — ABNORMAL LOW (ref 6.5–8.1)

## 2020-02-17 LAB — GLUCOSE, CAPILLARY
Glucose-Capillary: 103 mg/dL — ABNORMAL HIGH (ref 70–99)
Glucose-Capillary: 116 mg/dL — ABNORMAL HIGH (ref 70–99)
Glucose-Capillary: 137 mg/dL — ABNORMAL HIGH (ref 70–99)
Glucose-Capillary: 145 mg/dL — ABNORMAL HIGH (ref 70–99)
Glucose-Capillary: 150 mg/dL — ABNORMAL HIGH (ref 70–99)
Glucose-Capillary: 162 mg/dL — ABNORMAL HIGH (ref 70–99)
Glucose-Capillary: 247 mg/dL — ABNORMAL HIGH (ref 70–99)

## 2020-02-17 LAB — BPAM RBC
Blood Product Expiration Date: 202202102359
Blood Product Expiration Date: 202202202359
Blood Product Expiration Date: 202202202359
ISSUE DATE / TIME: 202201222221
ISSUE DATE / TIME: 202201230338
ISSUE DATE / TIME: 202201231124
Unit Type and Rh: 6200
Unit Type and Rh: 6200
Unit Type and Rh: 6200

## 2020-02-17 LAB — BPAM FFP
Blood Product Expiration Date: 202201262359
Unit Type and Rh: 6200

## 2020-02-17 LAB — FERRITIN: Ferritin: 2061 ng/mL — ABNORMAL HIGH (ref 24–336)

## 2020-02-17 LAB — FIBRIN DERIVATIVES D-DIMER (ARMC ONLY): Fibrin derivatives D-dimer (ARMC): 623.53 ng/mL (FEU) — ABNORMAL HIGH (ref 0.00–499.00)

## 2020-02-17 LAB — AMMONIA: Ammonia: 29 umol/L (ref 9–35)

## 2020-02-17 LAB — PROTIME-INR
INR: 1.9 — ABNORMAL HIGH (ref 0.8–1.2)
Prothrombin Time: 20.8 seconds — ABNORMAL HIGH (ref 11.4–15.2)

## 2020-02-17 LAB — MRSA PCR SCREENING: MRSA by PCR: NEGATIVE

## 2020-02-17 LAB — PROCALCITONIN
Procalcitonin: 1.09 ng/mL
Procalcitonin: <0.1 ng/mL

## 2020-02-17 LAB — MAGNESIUM: Magnesium: 2.7 mg/dL — ABNORMAL HIGH (ref 1.7–2.4)

## 2020-02-17 MED ORDER — INSULIN ASPART 100 UNIT/ML ~~LOC~~ SOLN
0.0000 [IU] | SUBCUTANEOUS | Status: DC
  Administered 2020-02-17: 3 [IU] via SUBCUTANEOUS
  Administered 2020-02-17: 4 [IU] via SUBCUTANEOUS
  Administered 2020-02-17 (×2): 3 [IU] via SUBCUTANEOUS
  Administered 2020-02-18: 7 [IU] via SUBCUTANEOUS
  Administered 2020-02-18: 3 [IU] via SUBCUTANEOUS
  Administered 2020-02-18: 4 [IU] via SUBCUTANEOUS
  Administered 2020-02-18 (×2): 3 [IU] via SUBCUTANEOUS
  Administered 2020-02-18: 4 [IU] via SUBCUTANEOUS
  Administered 2020-02-19 (×2): 3 [IU] via SUBCUTANEOUS
  Administered 2020-02-19 – 2020-02-20 (×6): 4 [IU] via SUBCUTANEOUS
  Administered 2020-02-20: 7 [IU] via SUBCUTANEOUS
  Administered 2020-02-20: 3 [IU] via SUBCUTANEOUS
  Administered 2020-02-20: 5 [IU] via SUBCUTANEOUS
  Administered 2020-02-20: 4 [IU] via SUBCUTANEOUS
  Administered 2020-02-21: 3 [IU] via SUBCUTANEOUS
  Administered 2020-02-21: 7 [IU] via SUBCUTANEOUS
  Administered 2020-02-21 (×2): 4 [IU] via SUBCUTANEOUS
  Administered 2020-02-21: 3 [IU] via SUBCUTANEOUS
  Administered 2020-02-21: 4 [IU] via SUBCUTANEOUS
  Administered 2020-02-22 (×2): 3 [IU] via SUBCUTANEOUS
  Administered 2020-02-22: 11 [IU] via SUBCUTANEOUS
  Administered 2020-02-22: 3 [IU] via SUBCUTANEOUS
  Administered 2020-02-22 – 2020-02-23 (×2): 4 [IU] via SUBCUTANEOUS
  Administered 2020-02-23: 11 [IU] via SUBCUTANEOUS
  Administered 2020-02-23: 4 [IU] via SUBCUTANEOUS
  Administered 2020-02-23: 15 [IU] via SUBCUTANEOUS
  Administered 2020-02-23: 7 [IU] via SUBCUTANEOUS
  Administered 2020-02-23: 11 [IU] via SUBCUTANEOUS
  Administered 2020-02-24 (×5): 7 [IU] via SUBCUTANEOUS
  Administered 2020-02-24: 4 [IU] via SUBCUTANEOUS
  Administered 2020-02-25: 7 [IU] via SUBCUTANEOUS
  Administered 2020-02-25: 11 [IU] via SUBCUTANEOUS
  Administered 2020-02-25: 7 [IU] via SUBCUTANEOUS
  Administered 2020-02-25: 4 [IU] via SUBCUTANEOUS
  Filled 2020-02-17 (×47): qty 1

## 2020-02-17 MED ORDER — POTASSIUM CHLORIDE 10 MEQ/50ML IV SOLN
10.0000 meq | INTRAVENOUS | Status: AC
  Administered 2020-02-17 (×4): 10 meq via INTRAVENOUS
  Filled 2020-02-17 (×4): qty 50

## 2020-02-17 MED ORDER — SODIUM CHLORIDE 0.45 % IV SOLN: INTRAVENOUS | Status: DC

## 2020-02-17 NOTE — Progress Notes (Signed)
*  PRELIMINARY RESULTS* Echocardiogram 2D Echocardiogram has been performed.  Evan [name] 02/17/2020, 1:18 PM

## 2020-02-17 NOTE — Progress Notes (Addendum)
Patient ID: Evan [name], male   DOB: Jun 19, 1940, 80 y.o.   MRN: [id number] [facility] PROGRESS NOTE  Evan [name] KDX:[id number] DOB: January 04, 1941 DOA: 02/23/2020 PCP: Leonel Ramsay, MD  HPI/Subjective: Patient answers a few questions today.  A little more alert today than yesterday.  Admitted with acute hypoxic respiratory failure and sepsis.  No complaints of chest pain or shortness of breath.  No abdominal pain nausea or vomiting.  Objective: Vitals:   02/17/20 0700 02/17/20 0800  BP: (!) 164/72   Pulse: 89   Resp: (!) 24   Temp:  (!) 96.9 F (36.1 C)  SpO2: 96%     Intake/Output Summary (Last 24 hours) at 02/17/2020 1115 Last data filed at 02/17/2020 0600 Gross per 24 hour  Intake 1884.98 ml  Output 1450 ml  Net 434.98 ml   Filed Weights   01/26/2020 1820  Weight: 104 kg    ROS: Review of Systems  Respiratory: Negative for shortness of breath.   Cardiovascular: Negative for chest pain.  Gastrointestinal: Negative for abdominal pain, nausea and vomiting.   Exam: Physical Exam HENT:     Head: Normocephalic.     Mouth/Throat:     Pharynx: No oropharyngeal exudate.  Eyes:     General: Lids are normal.     Conjunctiva/sclera: Conjunctivae normal.  Cardiovascular:     Rate and Rhythm: Normal rate and regular rhythm.     Heart sounds: Normal heart sounds, S1 normal and S2 normal.  Pulmonary:     Breath sounds: Examination of the right-lower field reveals decreased breath sounds. Examination of the left-lower field reveals decreased breath sounds. Decreased breath sounds present. No wheezing, rhonchi or rales.  Abdominal:     Palpations: Abdomen is soft.     Tenderness: There is no abdominal tenderness.  Musculoskeletal:     Right ankle: No swelling.     Left ankle: No swelling.  Skin:    General: Skin is warm.     Findings: No rash.  Neurological:     Mental Status: He is lethargic.     Comments: Answers some questions but then goes back to sleep        Data Reviewed: Basic Metabolic Panel: Recent Labs  Lab 02/14/2020 1907 02/16/20 0749 02/16/20 1525 02/17/20 0715  NA 153* 152* 152* 158*  K 3.6 3.7 3.4* 2.9*  CL 101 103 105 109  CO2 34* 32 33* 34*  GLUCOSE 213* 233* 296* 127*  BUN 69* 75* 79* 74*  CREATININE 1.70* 1.92* 2.05* 1.81*  CALCIUM 8.7* 8.0* 8.1* 8.3*  MG 2.6*  --   --   --    Liver Function Tests: Recent Labs  Lab 02/08/2020 1907 02/16/20 1525 02/17/20 0715  AST 32 30 35  ALT 25 21 22   ALKPHOS 107 84 82  BILITOT 4.2* 3.9* 3.8*  PROT 6.5 5.4* 5.8*  ALBUMIN 2.8* 2.3* 2.3*    Recent Labs  Lab 02/20/2020 1907 02/16/20 0749 02/17/20 0715  AMMONIA 38* 33 29   CBC: Recent Labs  Lab 02/10/2020 1907 02/16/20 0601 02/16/20 1525 02/17/20 0715  WBC 3.2* 3.5* 3.8* 4.9  NEUTROABS 2.6  --  3.3  --   HGB 5.7* 6.8* 7.5* 8.4*  HCT 18.9* 22.0* 23.4* 26.6*  MCV 106.8* 97.8 93.2 92.7  PLT 46* 41* 35* 34*   BNP (last 3 results) Recent Labs    01/26/20 1300 02/19/2020 1907  BNP 2,541.1* 1,999.6*    ProBNP (last 3 results) Recent Labs  12/06/19 1633  PROBNP 9,423*    CBG: Recent Labs  Lab 02/16/20 1513 02/16/20 2015 02/17/20 0027 02/17/20 0416 02/17/20 0735  GLUCAP 271* 211* 137* 103* 116*    Recent Results (from the past 240 hour(s))  SARS Coronavirus 2 by RT PCR (hospital order, performed in [facility] hospital lab) Nasopharyngeal Nasopharyngeal Swab     Status: Abnormal   Collection Time: 02/24/20  7:07 PM   Specimen: Nasopharyngeal Swab  Result Value Ref Range Status   SARS Coronavirus 2 POSITIVE (A) NEGATIVE Final    Comment: RESULT CALLED TO, READ BACK BY AND VERIFIED WITH: CHRISTINE HALLAS AT 2050 02/24/2020. MF (NOTE) SARS-CoV-2 target nucleic acids are DETECTED  SARS-CoV-2 RNA is generally detectable in upper respiratory specimens  during the acute phase of infection.  Positive results are indicative  of the presence of the identified virus, but do not rule out bacterial  infection or co-infection with other pathogens not detected by the test.  Clinical correlation with patient history and  other diagnostic information is necessary to determine patient infection status.  The expected result is negative.  Fact Sheet for Patients:   BoilerBrush.com.cy   Fact Sheet for Healthcare Providers:   https://pope.com/    This test is not yet approved or cleared by the [place] FDA and  has been authorized for detection and/or diagnosis of SARS-CoV-2 by FDA under an Emergency Use Authorization (EUA).  This EUA will remain in effect (meaning this  test can be used) for the duration of  the COVID-19 declaration under Section 564(b)(1) of the Act, 21 U.S.C. section 360-bbb-3(b)(1), unless the authorization is terminated or revoked sooner.  Performed at [facility], [address] [place], [place] [address]   CULTURE, BLOOD (ROUTINE X 2) w Reflex to ID Panel     Status: None (Preliminary result)   Collection Time: 02/16/20  2:11 AM   Specimen: BLOOD  Result Value Ref Range Status   Specimen Description BLOOD LEFT ANTECUBITAL  Final   Special Requests   Final    BOTTLES DRAWN AEROBIC AND ANAEROBIC Blood Culture results may not be optimal due to an excessive volume of blood received in culture bottles   Culture  Setup Time   Final    IN BOTH AEROBIC AND ANAEROBIC BOTTLES GRAM POSITIVE COCCI CRITICAL VALUE NOTED.  VALUE IS CONSISTENT WITH PREVIOUSLY REPORTED AND CALLED VALUE. Performed at [facility], [address] [place], [place] [address]    Culture [facility] POSITIVE COCCI  Final   Report Status PENDING  Incomplete  CULTURE, BLOOD (ROUTINE X 2) w Reflex to ID Panel     Status: Abnormal (Preliminary result)   Collection Time: 02/16/20  2:11 AM   Specimen: BLOOD  Result Value Ref Range Status   Specimen Description   Final    BLOOD BLOOD RIGHT FOREARM Performed at [facility],  [address], [place], [place] [address]    Special Requests   Final    BOTTLES DRAWN AEROBIC AND ANAEROBIC Blood Culture adequate volume Performed at [facility], [address] [place], [place] [address]    Culture  Setup Time   Final    IN BOTH AEROBIC AND ANAEROBIC BOTTLES GRAM POSITIVE COCCI CRITICAL RESULT CALLED TO, READ BACK BY AND VERIFIED WITH: BRANDON BEERS 02/16/20 AT 1229 BY ACR    Culture (A)  Final    ENTEROCOCCUS FAECALIS SUSCEPTIBILITIES TO FOLLOW Performed at [facility], [address], [place], [place]  [address]    Report Status PENDING  Incomplete  Blood Culture ID Panel (Reflexed)     Status: Abnormal   Collection Time: 02/16/20  2:11 AM  Result Value Ref Range Status   Enterococcus faecalis DETECTED (A) NOT DETECTED Final    Comment: CRITICAL RESULT CALLED TO, READ BACK BY AND VERIFIED WITH: BRANDON BEERS 02/16/20 AT 1229 BY ACR    Enterococcus Faecium NOT DETECTED NOT DETECTED Final   Listeria monocytogenes NOT DETECTED NOT DETECTED Final   Staphylococcus species NOT DETECTED NOT DETECTED Final   Staphylococcus aureus (BCID) NOT DETECTED NOT DETECTED Final   Staphylococcus epidermidis NOT DETECTED NOT DETECTED Final   Staphylococcus lugdunensis NOT DETECTED NOT DETECTED Final   Streptococcus species NOT DETECTED NOT DETECTED Final   Streptococcus agalactiae NOT DETECTED NOT DETECTED Final   Streptococcus pneumoniae NOT DETECTED NOT DETECTED Final   Streptococcus pyogenes NOT DETECTED NOT DETECTED Final   A.calcoaceticus-baumannii NOT DETECTED NOT DETECTED Final   Bacteroides fragilis NOT DETECTED NOT DETECTED Final   Enterobacterales NOT DETECTED NOT DETECTED Final   Enterobacter cloacae complex NOT DETECTED NOT DETECTED Final   Escherichia coli NOT DETECTED NOT DETECTED Final   Klebsiella aerogenes NOT DETECTED NOT DETECTED Final   Klebsiella oxytoca NOT DETECTED NOT DETECTED Final   Klebsiella pneumoniae NOT DETECTED NOT DETECTED  Final   Proteus species NOT DETECTED NOT DETECTED Final   Salmonella species NOT DETECTED NOT DETECTED Final   Serratia marcescens NOT DETECTED NOT DETECTED Final   Haemophilus influenzae NOT DETECTED NOT DETECTED Final   Neisseria meningitidis NOT DETECTED NOT DETECTED Final   Pseudomonas aeruginosa NOT DETECTED NOT DETECTED Final   Stenotrophomonas maltophilia NOT DETECTED NOT DETECTED Final   Candida albicans NOT DETECTED NOT DETECTED Final   Candida auris NOT DETECTED NOT DETECTED Final   Candida glabrata NOT DETECTED NOT DETECTED Final   Candida krusei NOT DETECTED NOT DETECTED Final   Candida parapsilosis NOT DETECTED NOT DETECTED Final   Candida tropicalis NOT DETECTED NOT DETECTED Final   Cryptococcus neoformans/gattii NOT DETECTED NOT DETECTED Final   Vancomycin resistance NOT DETECTED NOT DETECTED Final    Comment: Performed at [facility], [address] [place], [place] [address]  CULTURE, BLOOD (ROUTINE X 2) w Reflex to ID Panel     Status: None (Preliminary result)   Collection Time: 02/17/20  2:16 AM   Specimen: BLOOD  Result Value Ref Range Status   Specimen Description BLOOD BLOOD LEFT HAND  Final   Special Requests   Final    BOTTLES DRAWN AEROBIC AND ANAEROBIC Blood Culture adequate volume   Culture   Final    NO GROWTH < 12 HOURS Performed at [facility], [address] [place], [place] [address]    Report Status PENDING  Incomplete  CULTURE, BLOOD (ROUTINE X 2) w Reflex to ID Panel     Status: None (Preliminary result)   Collection Time: 02/17/20  2:16 AM   Specimen: BLOOD  Result Value Ref Range Status   Specimen Description BLOOD BLOOD RIGHT HAND  Final   Special Requests   Final    BOTTLES DRAWN AEROBIC AND ANAEROBIC Blood Culture adequate volume   Culture   Final    NO GROWTH < 12 HOURS Performed at [facility], [address], [place], [place] [address]    Report Status PENDING  Incomplete  MRSA PCR Screening      Status: None   Collection Time: 02/17/20  7:15 AM  Result Value Ref Range Status   MRSA by PCR NEGATIVE NEGATIVE Final    Comment:        The GeneXpert MRSA Assay (FDA approved for NASAL specimens only), is one component of a comprehensive MRSA colonization surveillance program. It is not intended to diagnose MRSA infection nor to guide or monitor treatment for MRSA infections. Performed at [facility], [address] [place], [place] [address]      Studies: DG Chest 2 View  Result Date: February 29, 2020 CLINICAL DATA:  Altered level of consciousness EXAM: CHEST - 2 VIEW COMPARISON:  01/30/2020 FINDINGS: Frontal and lateral views of the chest demonstrate stable single lead pacemaker. 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 Cardiac silhouette is mildly enlarged but stable. There is persistent central vascular congestion, with patchy left basilar airspace disease and small left effusion. No pneumothorax. No acute bony abnormalities. IMPRESSION: 1. Mild congestive heart failure, without significant change since previous. Electronically Signed   By: Randa Ngo M.D.   On: 29-Feb-2020 18:58   CT Head Wo Contrast  Result Date: 02/29/20 CLINICAL DATA:  80 year old male with history of altered mental status. EXAM: CT HEAD WITHOUT CONTRAST TECHNIQUE: Contiguous axial images were obtained from the base of the skull through the vertex without intravenous contrast. COMPARISON:  Head CT 08/24/2015.  Brain MRI 08/24/2015. 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 No evidence of acute infarction, hemorrhage, hydrocephalus, extra-axial collection or mass lesion/mass effect. Vascular: No hyperdense vessel or unexpected calcification. Skull: Normal. Negative for fracture or focal lesion. Sinuses/Orbits: No acute finding. Other: None.  IMPRESSION: 1. No acute intracranial abnormalities. 2. 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 Electronically Signed   By: Vinnie Langton M.D.   On: 02-29-2020 19:36   DG Chest Port 1 View  Result Date: 02/17/2020 CLINICAL DATA:  Acute respiratory failure EXAM: PORTABLE CHEST 1 VIEW COMPARISON:  02/16/2020 FINDINGS: Right internal jugular central venous catheter tip noted within the right atrium. Lung volumes are small, but are symmetric and are stable since prior examination. Superimposed perihilar and lower lung zone pulmonary infiltrate appears slightly progressive since prior examination. This may represent a component of superimposed pulmonary edema. No pneumothorax. Tiny left pleural effusion suspected. 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 Mild cardiomegaly is stable. 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 IMPRESSION: Progressive pulmonary infiltrates, possibly representing a component of superimposed cardiogenic failure. Probable tiny left pleural effusion. Stable pulmonary hypoinflation. Electronically Signed   By: Fidela Salisbury MD   On: 02/17/2020 06:00   DG CHEST PORT 1 VIEW  Result Date: 02/16/2020 CLINICAL DATA:  Central venous catheter placement. EXAM: PORTABLE CHEST 1 VIEW COMPARISON:  Chest radiograph 02/16/2020. FINDINGS: Right IJ central venous catheter tip projects over the superior vena cava. Pacer apparatus overlies the left hemithorax. Stable cardiomegaly. Aortic atherosclerosis. Similar-appearing diffuse bilateral airspace opacities. Possible small left pleural effusion. No pneumothorax. Aortic valve replacement. IMPRESSION: Right IJ central venous catheter tip projects over the superior vena cava. Similar-appearing diffuse bilateral airspace opacities. Electronically Signed   By: Lovey Newcomer M.D.   On: 02/16/2020 07:55   DG Chest Portable 1  View  Result Date: 02/16/2020 CLINICAL DATA:  Central venous catheter placed EXAM: PORTABLE CHEST 1 VIEW COMPARISON:  Feb 29, 2020 FINDINGS: Right internal jugular central venous catheter is in place with  its tip within the superior right atrium. No pneumothorax. Pulmonary insufflation has diminished resulting in vascular crowding at the hila. No pneumothorax. Possible tiny left pleural effusion. Coronary artery bypass grafting has been performed. Transcatheter aortic valve noted. Mild cardiomegaly is present. Left subclavian single lead pacemaker is seen with its lead overlying the expected right ventricle. IMPRESSION: Right internal jugular central venous catheter tip within the superior right atrium. No pneumothorax. Electronically Signed   By: Helyn Numbers MD   On: 02/16/2020 05:07    Scheduled Meds: . sodium chloride   Intravenous Once  . sodium chloride   Intravenous Once  . allopurinol  100 mg Oral Daily  . Chlorhexidine Gluconate Cloth  6 each Topical Daily  . docusate sodium  100 mg Oral Daily  . feeding supplement (NEPRO CARB STEADY)  237 mL Oral TID BM  . insulin aspart  0-20 Units Subcutaneous Q4H  . methylPREDNISolone (SOLU-MEDROL) injection  40 mg Intravenous Q12H  . multivitamin with minerals  1 tablet Oral Daily  . pantoprazole (PROTONIX) IV  40 mg Intravenous Q24H  . tamsulosin  0.4 mg Oral Daily   Continuous Infusions: . sodium chloride 100 mL/hr at 02/17/20 0944  . sodium chloride    . sodium chloride    . ampicillin (OMNIPEN) IV 2 g (02/17/20 0817)  . cefTRIAXone (ROCEPHIN)  IV 2 g (02/17/20 0941)  . potassium chloride    . remdesivir 100 mg in NS 100 mL 100 mg (02/17/20 1019)    Assessment/Plan:  1. Septic shock, present on admission with lactic acidosis up at 6.6.  Patient had fever, tachypnea, leukopenia and tachycardia.  Initial hypotension, acute metabolic encephalopathy, acute kidney injury and acute hypoxic respiratory failure.  Case discussed with infectious  disease specialist yesterday and will have on Rocephin plus ampicillin to cover Enterococcus faecalis in the bloodstream. 2. Acute on chronic hypoxic respiratory failure secondary to COVID-19.  Patient on heated high flow nasal cannula 65% oxygen and 40 L.  Continue Solu-Medrol and remdesivir. 3. Acute metabolic encephalopathy likely secondary to Covid infection and septic shock.  Patient able to answer few more questions today than yesterday but easily fell back asleep. 4. Acute kidney injury.  Initial creatinine around 1.16.  Creatinine peaked at 2.05 and this morning down to 1.81. 5. Myelodysplastic syndrome.  Initial hemoglobin 5.7 received 3 units of packed red blood cells during the hospital course so far.  Current hemoglobin 8.4. 6. Hematochezia.  Holding Eliquis for now 7. Hypokalemia replacing potassium IV 8. Hypernatremia on half-normal saline 9. Chronic systolic congestive heart failure.  Continue to monitor closely 10. 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  With holding Eliquis stroke risk will be higher 11. Type 2 diabetes mellitus on sliding scale 12. Stage I sacral decubitus ulcer, present on admission see full description below 13. Elevated troponin likely demand ischemia 14. Cirrhosis of liver 15. Palliative care consultation  Pressure Injury 02/16/20 Sacrum Mid Stage 1 -  Intact skin with non-blanchable redness of a localized area usually over a bony prominence. (Active)  02/16/20 1000  Location: Sacrum  Location Orientation: Mid  Staging: Stage 1 -  Intact skin with non-blanchable redness of a localized area usually over a bony prominence.  Wound Description (Comments):   Present on Admission: Yes       Code Status:     Code Status Orders  (From admission, onward)         Start     Ordered  02/21/2020 2149  Full code  Continuous        02/05/2020 2151        Code Status History    Date Active Date Inactive Code Status Order  ID Comments User Context   01/26/2020 1619 02/04/2020 2326 Full Code [id number]  Rhetta Mura, DO ED   12/07/2019 1334 12/12/2019 2209 Full Code [id number]  Mitzi Hansen, MD ED   05/09/2017 1240 05/11/2017 1803 Full Code [id number]  Sherren Mocha, MD Inpatient   03/23/2017 1327 03/24/2017 1435 Full Code [id number]  Constance Haw, MD Inpatient   03/23/2017 1323 03/23/2017 1327 Full Code [id number]  Sherren Mocha, MD Inpatient   08/24/2015 2222 08/26/2015 1548 Full Code [id number]  Ivor Costa, MD ED   05/28/2012 1407 05/29/2012 1415 Full Code [id number]  Gabriel Earing, PA-C Inpatient   Advance Care Planning Activity     Family Communication: Spoke with wife at the bedside Disposition Plan: Status is: Inpatient  Dispo: The patient is from: Home              Anticipated d/c is to: Rehab              Anticipated d/c date is: Likely will need a week in the hospital              Patient currently in the stepdown unit and not stable for disposition   Difficult to place patient: Yes if will need rehab.  No if able to go home.  Antibiotics:  Rocephin  Ampicillin  Time spent: 30 minutes, case discussed with oncology, ID, cardiology and critical care  [facility]  [facility]

## 2020-02-17 NOTE — Consult Note (Signed)
NAME: Evan [name]  DOB: 11-20-1940  MRN: [id number]  Date/Time: 02/17/2020 1:25 PM  REQUESTING PROVIDER: Dr.Wieting Subjective:  REASON FOR CONSULT: enterococcus bacteremia ?Pt is a poor historian- chart reviewed in detail Evan [name] is a 80 y.o. with a history of MDS, transfusion dependent anemia, permanent pacemaker. Afib, CADm CVA. Aortic stenosis, TAVR Presents with weakness, not eating or drinking and altered mental status on 02/01/2020 In the ED vitals were 98.7, BP 112/61, HR 83, RR 24, sats 90% on 4 L. Labs showed wbc 3.2, HB 5.7, plt 46, cr 1.70 COVID positive Blood culture sent and the patient was started on remdesivir, steroids, vancomycin cefepime and metronidazole.  blood culture came back positive for Enterococcus bacteremia.  The antibiotic was switched to ampicillin and ceftriaxone to treat like endocarditis.  I am seeing the patient for the same. Recent hospitalization 1/2-1/11 for shortness of breath and bilateral lower extremity swelling.  With right knee swelling.  He had acute on chronic congestive heart failure and acute gout.  He underwent arthrocentesis of the knee which showed urate crystals. 2D echo which was done on 12/08/2019 showed global hypokinesis with ejection fraction of 45 to 50%.  Patient was started on Lasix 60 mg twice daily.  For the gout he was given prednisone and was discharged home with allopurinol.  Patient has myelodysplastic syndrome and was followed by oncologist.  He had been started on Revlimid on 01/09/2020.  And he has been getting blood transfusion as outpatient. Past Medical History:  Diagnosis Date  . Anemia   . Carotid arterial disease ([place])   . CHF (congestive heart failure) ([place])   . Coronary artery disease   . Hearing loss   . HOH (hard of hearing)   . Hyperlipidemia   . Hypertension   . Nocturia   . Obesity   . Osteoarthritis    "BUE; BLE; right knee" (03/23/2017)  . Peripheral vision loss, bilateral    S/P 07/2015  .  Permanent atrial fibrillation ([place])   . 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 Severe aortic stenosis   . Sinus drainage   . Stroke [facility]) 07/2015   "peripheral vision still bad out of left eye" (03/23/2017)  . Torn rotator cuff 2012   Right arm  . Type II diabetes mellitus ([place])   . Urinary frequency    12/10/19- espohageal candidiasis by endoscopy Past Surgical History:  Procedure Laterality Date  . BACK SURGERY    . CARDIAC CATHETERIZATION  11/29/2005   SEVERE THREE VESSEL OBSTRUCTIVE ATHERSCLEROTIC CAD. ALL GRAFTS WERE PATENT. EF 65%  . CARDIAC CATHETERIZATION  03/23/2017  . CAROTID ENDARTERECTOMY Right 01-29-08   cea  . CARPAL TUNNEL RELEASE Right   . COLONOSCOPY N/A 12/10/2019   Procedure: COLONOSCOPY;  Surgeon: Wilford Corner, MD;  Location: [facility] ENDOSCOPY;  Service: Endoscopy;  Laterality: N/A;  . CORONARY ARTERY BYPASS GRAFT  03/2003   X3. LIMA GRAFT TO THE LAD, SAPHENOUS VEIN GRAFT TO THE PA, AND A LEFT RADIAL GRAFT TO THEOBTUSE MARGINAL VESSEL  . ENDARTERECTOMY Left 05/28/2012   Procedure: ENDARTERECTOMY CAROTID;  Surgeon: Elam Dutch, MD;  Location: [place];  Service: Vascular;  Laterality: Left;  . ESOPHAGOGASTRODUODENOSCOPY N/A 12/10/2019   Procedure: ESOPHAGOGASTRODUODENOSCOPY (EGD);  Surgeon: Wilford Corner, MD;  Location: [place];  Service: Endoscopy;  Laterality: N/A;  . HEMOSTASIS CLIP PLACEMENT  12/10/2019   Procedure: HEMOSTASIS CLIP PLACEMENT;  Surgeon: Wilford Corner, MD;  Location: [place];  Service: Endoscopy;;  . Randolm Idol /  REPLACE / REMOVE PACEMAKER  03/23/2017  . [place]; 1984  . MOLE SURGERY     "? face/arms"  . PACEMAKER IMPLANT N/A 03/23/2017   Procedure: PACEMAKER IMPLANT;  Surgeon: Constance Haw, MD;  Location: [place] CV LAB;  Service: Cardiovascular;  Laterality: N/A;  . PATCH ANGIOPLASTY Left 05/28/2012   Procedure: WITH dACRON PATCH ANGIOPLASTY;  Surgeon: Elam Dutch, MD;  Location: [place];  Service: Vascular;  Laterality: Left;  . POLYPECTOMY  12/10/2019   Procedure: POLYPECTOMY;  Surgeon: Wilford Corner, MD;  Location: [facility] ENDOSCOPY;  Service: Endoscopy;;  . RIGHT/LEFT HEART CATH AND CORONARY/GRAFT ANGIOGRAPHY N/A 03/23/2017   Procedure: RIGHT/LEFT HEART CATH AND CORONARY/GRAFT ANGIOGRAPHY;  Surgeon: Sherren Mocha, MD;  Location: [place] CV LAB;  Service: Cardiovascular;  Laterality: N/A;  . TEE WITHOUT CARDIOVERSION N/A 05/09/2017   Procedure: TRANSESOPHAGEAL ECHOCARDIOGRAM (TEE);  Surgeon: Sherren Mocha, MD;  Location: [place];  Service: Open Heart Surgery;  Laterality: N/A;  . TRANSCATHETER AORTIC VALVE REPLACEMENT, TRANSFEMORAL N/A 05/09/2017   Procedure: TRANSCATHETER AORTIC VALVE REPLACEMENT, TRANSFEMORAL using an Edwards 48mm Sapien 3 Aortic Valve;  Surgeon: Sherren Mocha, MD;  Location: [place];  Service: Open Heart Surgery;  Laterality: N/A;    Social History   Socioeconomic History  . Marital status: Married    Spouse name: Not on file  . Number of children: 5  . Years of education: Not on file  . Highest education level: Not on file  Occupational History  . Occupation: retired    Fish farm manager: RETIRED  Tobacco Use  . Smoking status: Former Smoker    Packs/day: 2.00    Years: 39.00    Pack years: 78.00    Types: Cigarettes    Quit date: 01/24/1994    Years since quitting: 26.0  . Smokeless tobacco: Never Used  Vaping Use  . Vaping Use: Never used  Substance and Sexual Activity  . Alcohol use: No  . Drug use: No  . Sexual activity: Never  Other Topics Concern  . Not on file  Social History Narrative   On Farm/ in [place]; no alcohol; quit smoking- 96; worked in Estate agent.    Social Determinants of Health   Financial Resource Strain: Not on file  Food Insecurity: Not on file  Transportation Needs: Not on file  Physical Activity: Not on file  Stress: Not on file  Social Connections: Not on file  Intimate Partner Violence: Not  on file    Family History  Problem Relation Age of Onset  . Heart attack Father   . Heart attack Brother   . Parkinsonism Brother   . Cancer Brother   . Arrhythmia Brother    Allergies  Allergen Reactions  . Niacin Other (See Comments) and Rash    Red face and burns.  . Niacin And Related Rash and Other (See Comments)    Red face and burns.  . Metformin And Related Diarrhea  . Statins Other (See Comments)    Myalgias, weakness    ? Current Facility-Administered Medications  Medication Dose Route Frequency Provider Last Rate Last Admin  . 0.45 % sodium chloride infusion   Intravenous Continuous Loletha Grayer, MD 100 mL/hr at 02/17/20 0944 New Bag at 02/17/20 0944  . 0.9 %  sodium chloride infusion (Manually program via Guardrails IV Fluids)   Intravenous Once Rust-Chester, Britton L, NP      . 0.9 %  sodium chloride infusion (Manually program via Guardrails IV  Fluids)   Intravenous Once Rust-Chester, Britton L, NP      . 0.9 %  sodium chloride infusion  10 mL/hr Intravenous Once Cuthriell, Jonathan D, PA-C      . 0.9 %  sodium chloride infusion  250 mL Intravenous Continuous Rust-Chester, Britton L, NP      . acetaminophen (TYLENOL) tablet 650 mg  650 mg Oral Q6H PRN Athena Masse, MD       Or  . acetaminophen (TYLENOL) suppository 650 mg  650 mg Rectal Q6H PRN Athena Masse, MD      . allopurinol (ZYLOPRIM) tablet 100 mg  100 mg Oral Daily Judd Gaudier V, MD      . ampicillin (OMNIPEN) 2 g in sodium chloride 0.9 % 100 mL IVPB  2 g Intravenous Q6H Loletha Grayer, MD 300 mL/hr at 02/17/20 0817 2 g at 02/17/20 0817  . cefTRIAXone (ROCEPHIN) 2 g in sodium chloride 0.9 % 100 mL IVPB  2 g Intravenous Q12H Tommy Medal, Lavell Islam, MD 200 mL/hr at 02/17/20 0941 2 g at 02/17/20 0941  . Chlorhexidine Gluconate Cloth 2 % PADS 6 each  6 each Topical Daily Rosine Door, MD   6 each at 02/17/20 [phone]  . docusate sodium (COLACE) capsule 100 mg  100 mg Oral Daily Judd Gaudier V, MD       . feeding supplement (NEPRO CARB STEADY) liquid 237 mL  237 mL Oral TID BM Wieting, [place], MD      . insulin aspart (novoLOG) injection 0-20 Units  0-20 Units Subcutaneous Q4H Rust-Chester, Britton L, NP   3 Units at 02/17/20 1119  . methylPREDNISolone sodium succinate (SOLU-MEDROL) 40 mg/mL injection 40 mg  40 mg Intravenous Q12H Rosine Door, MD   40 mg at 02/17/20 0757  . multivitamin with minerals tablet 1 tablet  1 tablet Oral Daily Wieting, Richard, MD      . ondansetron [facility]) tablet 4 mg  4 mg Oral Q6H PRN Athena Masse, MD       Or  . ondansetron [facility]) injection 4 mg  4 mg Intravenous Q6H PRN Athena Masse, MD      . pantoprazole (PROTONIX) injection 40 mg  40 mg Intravenous Q24H Rust-Chester, Britton L, NP   40 mg at 02/17/20 [id number]  . potassium chloride 10 mEq in 50 mL *CENTRAL LINE* IVPB  10 mEq Intravenous Q1 Hr x 4 Wieting, Richard, MD 50 mL/hr at 02/17/20 1121 10 mEq at 02/17/20 1121  . remdesivir 100 mg in sodium chloride 0.9 % 100 mL IVPB  100 mg Intravenous Daily Hart Robinsons A, RPH 200 mL/hr at 02/17/20 1019 100 mg at 02/17/20 1019  . tamsulosin (FLOMAX) capsule 0.4 mg  0.4 mg Oral Daily Athena Masse, MD         Abtx:  Anti-infectives (From admission, onward)   Start     Dose/Rate Route Frequency Ordered Stop   02/17/20 1600  gentamicin (GARAMYCIN) 90 mg in dextrose 5 % 50 mL IVPB  Status:  Discontinued       "Followed by" Linked Group Details   90 mg 104.5 mL/hr over 30 Minutes Intravenous Every 24 hours 02/16/20 1453 02/16/20 1512   02/17/20 1000  remdesivir 100 mg in sodium chloride 0.9 % 100 mL IVPB       "Followed by" Linked Group Details   100 mg 200 mL/hr over 30 Minutes Intravenous Daily 02/16/20 0135 02/21/20 0959   02/16/20 1800  ceFEPIme (MAXIPIME) 2 g  in sodium chloride 0.9 % 100 mL IVPB  Status:  Discontinued        2 g 200 mL/hr over 30 Minutes Intravenous Every 12 hours 02/16/20 0303 02/16/20 1320   02/16/20 1545  gentamicin (GARAMYCIN) 130  mg in dextrose 5 % 50 mL IVPB  Status:  Discontinued       "Followed by" Linked Group Details   1.5 mg/kg  88.2 kg (Adjusted) 106.5 mL/hr over 30 Minutes Intravenous  Once 02/16/20 1453 02/16/20 1512   02/16/20 1530  cefTRIAXone (ROCEPHIN) 2 g in sodium chloride 0.9 % 100 mL IVPB        2 g 200 mL/hr over 30 Minutes Intravenous Every 12 hours 02/16/20 1431     02/16/20 1415  ampicillin (OMNIPEN) 2 g in sodium chloride 0.9 % 100 mL IVPB        2 g 300 mL/hr over 20 Minutes Intravenous Every 6 hours 02/16/20 1320     02/16/20 1200  vancomycin (VANCOCIN) IVPB 1000 mg/200 mL premix  Status:  Discontinued        1,000 mg 200 mL/hr over 60 Minutes Intravenous Every 24 hours 02/16/20 0517 02/16/20 1320   02/16/20 0400  remdesivir 200 mg in sodium chloride 0.9% 250 mL IVPB       "Followed by" Linked Group Details   200 mg 580 mL/hr over 30 Minutes Intravenous Once 02/16/20 0135 02/16/20 0531   02/16/20 0300  metroNIDAZOLE (FLAGYL) IVPB 500 mg  Status:  Discontinued        500 mg 100 mL/hr over 60 Minutes Intravenous Every 8 hours 02/16/20 0142 02/16/20 1320   02/16/20 0145  ceFEPIme (MAXIPIME) 2 g in sodium chloride 0.9 % 100 mL IVPB        2 g 200 mL/hr over 30 Minutes Intravenous  Once 02/16/20 0142 02/16/20 0357   02/16/20 0145  vancomycin (VANCOCIN) IVPB 1000 mg/200 mL premix        1,000 mg 200 mL/hr over 60 Minutes Intravenous  Once 02/16/20 0142 02/16/20 0333      REVIEW OF SYSTEMS:  Not available because of patient's acute status and encephalopathy. Objective:  VITALS:  BP 138/60   Pulse 85   Temp (!) 96.4 F (35.8 C) (Axillary)   Resp (!) 27   Ht 6' (1.829 m)   Wt 104 kg   SpO2 93%   BMI 31.10 kg/m  PHYSICAL EXAM:  General: Patient is lethargic.  On calling his name he opens his eyes and is able to converse.  He responds to some commands.   confused. Head: Normocephalic, without obvious abnormality, atraumatic. Eyes: Conjunctivae clear, anicteric sclerae. Pupils are  equal ENT Nares normal. No drainage or sinus tenderness. Lips, mucosa, and tongue normal. No Thrush Neck: Supple,  Back: Did not examine. Lungs: Bilateral air entry.  Crepitations bases Heart: Irregular Pacemaker site okay Abdomen: Soft, non-tender,not distended. Bowel sounds normal. No masses Extremities: Edema legs Skin: Limited examination. Lymph: Cervical, supraclavicular normal. Neurologic: Cannot examine in detail. Pertinent Labs Lab Results CBC    Component Value Date/Time   WBC 4.9 02/17/2020 0715   RBC 2.87 (L) 02/17/2020 0715   HGB 8.4 (L) 02/17/2020 0715   HGB 7.6 (L) 01/29/2020 1115   HCT 26.6 (L) 02/17/2020 0715   HCT 19.6 (L) 12/06/2019 1630   PLT 34 (L) 02/17/2020 0715   PLT 287 12/06/2019 1630   MCV 92.7 02/17/2020 0715   MCV 107 (H) 12/06/2019 1630   MCH 29.3 02/17/2020 0715  MCHC 31.6 02/17/2020 0715   RDW 25.4 (H) 02/17/2020 0715   RDW 16.9 (H) 12/06/2019 1630   LYMPHSABS 0.4 (L) 02/16/2020 1525   LYMPHSABS 1.0 03/02/2018 1205   MONOABS 0.1 02/16/2020 1525   EOSABS 0.0 02/16/2020 1525   EOSABS 0.2 03/02/2018 1205   BASOSABS 0.0 02/16/2020 1525   BASOSABS 0.0 03/02/2018 1205    CMP Latest Ref Rng & Units 02/17/2020 02/16/2020 02/16/2020  Glucose 70 - 99 mg/dL 127(H) 296(H) 233(H)  BUN 8 - 23 mg/dL 74(H) 79(H) 75(H)  Creatinine 0.61 - 1.24 mg/dL 1.81(H) 2.05(H) 1.92(H)  Sodium 135 - 145 mmol/L 158(H) 152(H) 152(H)  Potassium 3.5 - 5.1 mmol/L 2.9(L) 3.4(L) 3.7  Chloride 98 - 111 mmol/L 109 105 103  CO2 22 - 32 mmol/L 34(H) 33(H) 32  Calcium 8.9 - 10.3 mg/dL 8.3(L) 8.1(L) 8.0(L)  Total Protein 6.5 - 8.1 g/dL 5.8(L) 5.4(L) -  Total Bilirubin 0.3 - 1.2 mg/dL 3.8(H) 3.9(H) -  Alkaline Phos 38 - 126 U/L 82 84 -  AST 15 - 41 U/L 35 30 -  ALT 0 - 44 U/L 22 21 -      Microbiology: Recent Results (from the past 240 hour(s))  SARS Coronavirus 2 by RT PCR (hospital order, performed in [facility] hospital lab) Nasopharyngeal Nasopharyngeal Swab      Status: Abnormal   Collection Time: 02-20-2020  7:07 PM   Specimen: Nasopharyngeal Swab  Result Value Ref Range Status   SARS Coronavirus 2 POSITIVE (A) NEGATIVE Final    Comment: RESULT CALLED TO, READ BACK BY AND VERIFIED WITH: CHRISTINE HALLAS AT 2050 February 20, 2020. MF (NOTE) SARS-CoV-2 target nucleic acids are DETECTED  SARS-CoV-2 RNA is generally detectable in upper respiratory specimens  during the acute phase of infection.  Positive results are indicative  of the presence of the identified virus, but do not rule out bacterial infection or co-infection with other pathogens not detected by the test.  Clinical correlation with patient history and  other diagnostic information is necessary to determine patient infection status.  The expected result is negative.  Fact Sheet for Patients:   StrictlyIdeas.no   Fact Sheet for Healthcare Providers:   BankingDealers.co.za    This test is not yet approved or cleared by the [place] FDA and  has been authorized for detection and/or diagnosis of SARS-CoV-2 by FDA under an Emergency Use Authorization (EUA).  This EUA will remain in effect (meaning this  test can be used) for the duration of  the COVID-19 declaration under Section 564(b)(1) of the Act, 21 U.S.C. section 360-bbb-3(b)(1), unless the authorization is terminated or revoked sooner.  Performed at [facility], [address] [place], [place] [address]   CULTURE, BLOOD (ROUTINE X 2) w Reflex to ID Panel     Status: Abnormal (Preliminary result)   Collection Time: 02/16/20  2:11 AM   Specimen: BLOOD  Result Value Ref Range Status   Specimen Description   Final    BLOOD LEFT ANTECUBITAL Performed at [facility], [address] [name], [place] [address]    Special Requests   Final    BOTTLES DRAWN AEROBIC AND ANAEROBIC Blood Culture results may not be optimal due to an excessive volume of blood received in  culture bottles Performed at [facility], [address] [place], [place] [address]    Culture  Setup Time   Final    IN BOTH AEROBIC AND ANAEROBIC BOTTLES GRAM POSITIVE COCCI CRITICAL VALUE NOTED.  VALUE IS  CONSISTENT WITH PREVIOUSLY REPORTED AND CALLED VALUE. Performed at [facility], [address], [place], [place] [address]    Culture ENTEROCOCCUS FAECALIS (A)  Final   Report Status PENDING  Incomplete  CULTURE, BLOOD (ROUTINE X 2) w Reflex to ID Panel     Status: Abnormal (Preliminary result)   Collection Time: 02/16/20  2:11 AM   Specimen: BLOOD  Result Value Ref Range Status   Specimen Description   Final    BLOOD BLOOD RIGHT FOREARM Performed at [facility], [address], [place], [place] [address]    Special Requests   Final    BOTTLES DRAWN AEROBIC AND ANAEROBIC Blood Culture adequate volume Performed at [facility], [address] [place], [place] [address]    Culture  Setup Time   Final    IN BOTH AEROBIC AND ANAEROBIC BOTTLES GRAM POSITIVE COCCI CRITICAL RESULT CALLED TO, READ BACK BY AND VERIFIED WITH: BRANDON BEERS 02/16/20 AT 1229 BY ACR    Culture (A)  Final    ENTEROCOCCUS FAECALIS SUSCEPTIBILITIES TO FOLLOW Performed at [facility], [place] [address], [place], [place] [address]    Report Status PENDING  Incomplete  Blood Culture ID Panel (Reflexed)     Status: Abnormal   Collection Time: 02/16/20  2:11 AM  Result Value Ref Range Status   Enterococcus faecalis DETECTED (A) NOT DETECTED Final    Comment: CRITICAL RESULT CALLED TO, READ BACK BY AND VERIFIED WITH: BRANDON BEERS 02/16/20 AT 1229 BY ACR    Enterococcus Faecium NOT DETECTED NOT DETECTED Final   Listeria monocytogenes NOT DETECTED NOT DETECTED Final   Staphylococcus species NOT DETECTED NOT DETECTED Final   Staphylococcus aureus (BCID) NOT DETECTED NOT DETECTED Final   Staphylococcus epidermidis NOT DETECTED NOT DETECTED Final    Staphylococcus lugdunensis NOT DETECTED NOT DETECTED Final   Streptococcus species NOT DETECTED NOT DETECTED Final   Streptococcus agalactiae NOT DETECTED NOT DETECTED Final   Streptococcus pneumoniae NOT DETECTED NOT DETECTED Final   Streptococcus pyogenes NOT DETECTED NOT DETECTED Final   A.calcoaceticus-baumannii NOT DETECTED NOT DETECTED Final   Bacteroides fragilis NOT DETECTED NOT DETECTED Final   Enterobacterales NOT DETECTED NOT DETECTED Final   Enterobacter cloacae complex NOT DETECTED NOT DETECTED Final   Escherichia coli NOT DETECTED NOT DETECTED Final   Klebsiella aerogenes NOT DETECTED NOT DETECTED Final   Klebsiella oxytoca NOT DETECTED NOT DETECTED Final   Klebsiella pneumoniae NOT DETECTED NOT DETECTED Final   Proteus species NOT DETECTED NOT DETECTED Final   Salmonella species NOT DETECTED NOT DETECTED Final   Serratia marcescens NOT DETECTED NOT DETECTED Final   Haemophilus influenzae NOT DETECTED NOT DETECTED Final   Neisseria meningitidis NOT DETECTED NOT DETECTED Final   Pseudomonas aeruginosa NOT DETECTED NOT DETECTED Final   Stenotrophomonas maltophilia NOT DETECTED NOT DETECTED Final   Candida albicans NOT DETECTED NOT DETECTED Final   Candida auris NOT DETECTED NOT DETECTED Final   Candida glabrata NOT DETECTED NOT DETECTED Final   Candida krusei NOT DETECTED NOT DETECTED Final   Candida parapsilosis NOT DETECTED NOT DETECTED Final   Candida tropicalis NOT DETECTED NOT DETECTED Final   Cryptococcus neoformans/gattii NOT DETECTED NOT DETECTED Final   Vancomycin resistance NOT DETECTED NOT DETECTED Final    Comment: Performed at [facility], [address] [place], [place] [address]  CULTURE, BLOOD (ROUTINE X 2) w Reflex to ID Panel     Status: None (Preliminary result)   Collection Time: 02/17/20  2:16 AM   Specimen: BLOOD  Result Value Ref Range Status   Specimen Description BLOOD BLOOD LEFT HAND  Final   Special Requests   Final    BOTTLES  DRAWN AEROBIC AND ANAEROBIC Blood Culture adequate volume   Culture   Final    NO GROWTH < 12 HOURS Performed at [facility], [address], [place], [place] [address]    Report Status PENDING  Incomplete  CULTURE, BLOOD (ROUTINE X 2) w Reflex to ID Panel     Status: None (Preliminary result)   Collection Time: 02/17/20  2:16 AM   Specimen: BLOOD  Result Value Ref Range Status   Specimen Description BLOOD BLOOD RIGHT HAND  Final   Special Requests   Final    BOTTLES DRAWN AEROBIC AND ANAEROBIC Blood Culture adequate volume   Culture   Final    NO GROWTH < 12 HOURS Performed at [facility], [address], [place], [place] [address]    Report Status PENDING  Incomplete  MRSA PCR Screening     Status: None   Collection Time: 02/17/20  7:15 AM  Result Value Ref Range Status   MRSA by PCR NEGATIVE NEGATIVE Final    Comment:        The GeneXpert MRSA Assay (FDA approved for NASAL specimens only), is one component of a comprehensive MRSA colonization surveillance program. It is not intended to diagnose MRSA infection nor to guide or monitor treatment for MRSA infections. Performed at [facility], [address] [place], [place] [address]     IMAGING RESULTS:  I have personally reviewed the films ? Impression/Recommendation ? Enterococcus bacteremia- need to r/o edocarditis-2D echo has been done.  May need TEE.  As patient has a pacemaker concern for device infection. Patient is on ampicillin and ceftriaxone. Source of the Enterococcus is still unclear.  Could be from the GI tract. UA shows 6-10 WBC.  Urine culture is pending.  COVID illness on remdesivir and methylprednisolone.  Encephalopathy: Combination of bacteremia and Covid illness  TAVR Pacemaker Afib CHF Hyperbilirubinemia CKD  Myelodysplastic syndrome with anemia, leukopenia and thrombocytopenia.  Cirrhosis liver   Gout on  allopurinol ? ? ___________________________________________________ Discussed with his nurse. Note:  This document was prepared using Dragon voice recognition software and may include unintentional dictation errors.

## 2020-02-17 NOTE — Consult Note (Signed)
[place] NOTE  Patient Care Team: Leonel Ramsay, MD as PCP - General (Infectious Diseases) [name], [name] M, MD as PCP - Cardiology (Cardiology) Constance Haw, MD as PCP - Electrophysiology (Cardiology) Cammie Sickle, MD as Consulting Physician (Hematology and Oncology)  CHIEF COMPLAINTS/PURPOSE OF CONSULTATION: MDS.  HISTORY OF PRESENTING ILLNESS:  Evan [name] 80 y.o.  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 fib on Eliquis/ congestive heart failure/CKD/recent acute gout-is currently in the hospital for worsening shortness of breath/generalized weakness.  Patient also noted to have enterococcal bacteremia.  Patient also Covid positive.  Patient is currently in the ICU.  Patient is confused.  And hence unable to participate in history.  On admission patient's hemoglobin was noted to be around 5.6.  Platelets in the 30s.  He received 3 units of PRBC transfusion.  As per the review of chart notes of any GI bleed.   Review of Systems  Unable to perform ROS: Mental acuity     MEDICAL HISTORY:  Past Medical History:  Diagnosis Date  . Anemia   . Carotid arterial disease ([place])   . CHF (congestive heart failure) ([place])   . Coronary artery disease   . Hearing loss   . HOH (hard of hearing)   . Hyperlipidemia   . Hypertension   . Nocturia   . Obesity   . Osteoarthritis    "BUE; BLE; right knee" (03/23/2017)  . Peripheral vision loss, bilateral    S/P 07/2015  . Permanent atrial fibrillation ([place])   . 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 Severe aortic stenosis   . Sinus drainage   . Stroke [facility]) 07/2015   "peripheral vision still bad out of left eye" (03/23/2017)  . Torn rotator cuff 2012   Right arm  . Type II diabetes mellitus ([place])   . Urinary frequency     SURGICAL HISTORY: Past Surgical History:  Procedure Laterality Date  . BACK SURGERY    . CARDIAC  CATHETERIZATION  11/29/2005   SEVERE THREE VESSEL OBSTRUCTIVE ATHERSCLEROTIC CAD. ALL GRAFTS WERE PATENT. EF 65%  . CARDIAC CATHETERIZATION  03/23/2017  . CAROTID ENDARTERECTOMY Right 01-29-08   cea  . CARPAL TUNNEL RELEASE Right   . COLONOSCOPY N/A 12/10/2019   Procedure: COLONOSCOPY;  Surgeon: Wilford Corner, MD;  Location: [facility] ENDOSCOPY;  Service: Endoscopy;  Laterality: N/A;  . CORONARY ARTERY BYPASS GRAFT  03/2003   X3. LIMA GRAFT TO THE LAD, SAPHENOUS VEIN GRAFT TO THE PA, AND A LEFT RADIAL GRAFT TO THEOBTUSE MARGINAL VESSEL  . ENDARTERECTOMY Left 05/28/2012   Procedure: ENDARTERECTOMY CAROTID;  Surgeon: Elam Dutch, MD;  Location: [place];  Service: Vascular;  Laterality: Left;  . ESOPHAGOGASTRODUODENOSCOPY N/A 12/10/2019   Procedure: ESOPHAGOGASTRODUODENOSCOPY (EGD);  Surgeon: Wilford Corner, MD;  Location: [place];  Service: Endoscopy;  Laterality: N/A;  . HEMOSTASIS CLIP PLACEMENT  12/10/2019   Procedure: HEMOSTASIS CLIP PLACEMENT;  Surgeon: Wilford Corner, MD;  Location: [place] ENDOSCOPY;  Service: Endoscopy;;  . INSERT / REPLACE / REMOVE PACEMAKER  03/23/2017  . [place]; 1984  . MOLE SURGERY     "? face/arms"  . PACEMAKER IMPLANT N/A 03/23/2017   Procedure: PACEMAKER IMPLANT;  Surgeon: Constance Haw, MD;  Location: [place] CV LAB;  Service: Cardiovascular;  Laterality: N/A;  . PATCH ANGIOPLASTY Left 05/28/2012   Procedure: WITH dACRON PATCH ANGIOPLASTY;  Surgeon: Elam Dutch, MD;  Location:  [facility] OR;  Service: Vascular;  Laterality: Left;  . POLYPECTOMY  12/10/2019   Procedure: POLYPECTOMY;  Surgeon: Wilford Corner, MD;  Location: [facility] ENDOSCOPY;  Service: Endoscopy;;  . RIGHT/LEFT HEART CATH AND CORONARY/GRAFT ANGIOGRAPHY N/A 03/23/2017   Procedure: RIGHT/LEFT HEART CATH AND CORONARY/GRAFT ANGIOGRAPHY;  Surgeon: Sherren Mocha, MD;  Location: [place] CV LAB;  Service: Cardiovascular;  Laterality: N/A;  . TEE WITHOUT CARDIOVERSION N/A  05/09/2017   Procedure: TRANSESOPHAGEAL ECHOCARDIOGRAM (TEE);  Surgeon: Sherren Mocha, MD;  Location: [place];  Service: Open Heart Surgery;  Laterality: N/A;  . TRANSCATHETER AORTIC VALVE REPLACEMENT, TRANSFEMORAL N/A 05/09/2017   Procedure: TRANSCATHETER AORTIC VALVE REPLACEMENT, TRANSFEMORAL using an Edwards 67mm Sapien 3 Aortic Valve;  Surgeon: Sherren Mocha, MD;  Location: [place];  Service: Open Heart Surgery;  Laterality: N/A;    SOCIAL HISTORY: Social History   Socioeconomic History  . Marital status: Married    Spouse name: Not on file  . Number of children: 5  . Years of education: Not on file  . Highest education level: Not on file  Occupational History  . Occupation: retired    Fish farm manager: RETIRED  Tobacco Use  . Smoking status: Former Smoker    Packs/day: 2.00    Years: 39.00    Pack years: 78.00    Types: Cigarettes    Quit date: 01/24/1994    Years since quitting: 26.0  . Smokeless tobacco: Never Used  Vaping Use  . Vaping Use: Never used  Substance and Sexual Activity  . Alcohol use: No  . Drug use: No  . Sexual activity: Never  Other Topics Concern  . Not on file  Social History Narrative   On Farm/ in [place]; no alcohol; quit smoking- 96; worked in Estate agent.    Social Determinants of Health   Financial Resource Strain: Not on file  Food Insecurity: Not on file  Transportation Needs: Not on file  Physical Activity: Not on file  Stress: Not on file  Social Connections: Not on file  Intimate Partner Violence: Not on file    FAMILY HISTORY: Family History  Problem Relation Age of Onset  . Heart attack Father   . Heart attack Brother   . Parkinsonism Brother   . Cancer Brother   . Arrhythmia Brother     ALLERGIES:  is allergic to niacin, niacin and related, metformin and related, and statins.  MEDICATIONS:  Current Facility-Administered Medications  Medication Dose Route Frequency Provider Last Rate Last Admin  . 0.45 % sodium  chloride infusion   Intravenous Continuous Loletha Grayer, MD 100 mL/hr at 02/17/20 0944 New Bag at 02/17/20 0944  . 0.9 %  sodium chloride infusion (Manually program via Guardrails IV Fluids)   Intravenous Once Rust-Chester, Britton L, NP      . 0.9 %  sodium chloride infusion (Manually program via Guardrails IV Fluids)   Intravenous Once Rust-Chester, Britton L, NP      . 0.9 %  sodium chloride infusion  10 mL/hr Intravenous Once Cuthriell, Jonathan D, PA-C      . 0.9 %  sodium chloride infusion  250 mL Intravenous Continuous Rust-Chester, Britton L, NP      . acetaminophen (TYLENOL) tablet 650 mg  650 mg Oral Q6H PRN Athena Masse, MD       Or  . acetaminophen (TYLENOL) suppository 650 mg  650 mg Rectal Q6H PRN Athena Masse, MD      . allopurinol (ZYLOPRIM) tablet 100 mg  100  mg Oral Daily Judd Gaudier V, MD      . ampicillin (OMNIPEN) 2 g in sodium chloride 0.9 % 100 mL IVPB  2 g Intravenous Q6H Loletha Grayer, MD 300 mL/hr at 02/17/20 0817 2 g at 02/17/20 0817  . cefTRIAXone (ROCEPHIN) 2 g in sodium chloride 0.9 % 100 mL IVPB  2 g Intravenous Q12H Tommy Medal, Lavell Islam, MD 200 mL/hr at 02/17/20 0941 2 g at 02/17/20 0941  . Chlorhexidine Gluconate Cloth 2 % PADS 6 each  6 each Topical Daily Rosine Door, MD   6 each at 02/17/20 [phone]  . docusate sodium (COLACE) capsule 100 mg  100 mg Oral Daily Judd Gaudier V, MD      . feeding supplement (NEPRO CARB STEADY) liquid 237 mL  237 mL Oral TID BM Wieting, Richard, MD      . insulin aspart (novoLOG) injection 0-20 Units  0-20 Units Subcutaneous Q4H Rust-Chester, Britton L, NP   3 Units at 02/17/20 1119  . methylPREDNISolone sodium succinate (SOLU-MEDROL) 40 mg/mL injection 40 mg  40 mg Intravenous Q12H Rosine Door, MD   40 mg at 02/17/20 0757  . multivitamin with minerals tablet 1 tablet  1 tablet Oral Daily Wieting, Richard, MD      . ondansetron [facility]) tablet 4 mg  4 mg Oral Q6H PRN Athena Masse, MD       Or  . ondansetron  [facility]) injection 4 mg  4 mg Intravenous Q6H PRN Athena Masse, MD      . pantoprazole (PROTONIX) injection 40 mg  40 mg Intravenous Q24H Rust-Chester, Britton L, NP   40 mg at 02/17/20 [id number]  . potassium chloride 10 mEq in 50 mL *CENTRAL LINE* IVPB  10 mEq Intravenous Q1 Hr x 4 Wieting, Richard, MD 50 mL/hr at 02/17/20 1121 10 mEq at 02/17/20 1121  . remdesivir 100 mg in sodium chloride 0.9 % 100 mL IVPB  100 mg Intravenous Daily Hart Robinsons A, RPH 200 mL/hr at 02/17/20 1019 100 mg at 02/17/20 1019  . tamsulosin (FLOMAX) capsule 0.4 mg  0.4 mg Oral Daily Athena Masse, MD          .  PHYSICAL EXAMINATION:  Vitals:   02/17/20 1100 02/17/20 1122  BP: 138/60   Pulse: 85   Resp: (!) 27   Temp:  (!) 96.4 F (35.8 C)  SpO2: 93%    Filed Weights   02/04/2020 1820  Weight: 229 lb 4.5 oz (104 kg)    Physical Exam Constitutional:      Comments: Patient is arousable.  However confused.  HENT:     Head: Normocephalic and atraumatic.     Mouth/Throat:     Mouth: Oropharynx is clear and moist.     Pharynx: No oropharyngeal exudate.  Eyes:     Pupils: Pupils are equal, round, and reactive to light.  Cardiovascular:     Rate and Rhythm: Normal rate.     Comments: Irregular heart rhythm. Pulmonary:     Effort: No respiratory distress.     Breath sounds: No wheezing.     Comments: Decreased breath sounds at the bases bilaterally. Abdominal:     General: Bowel sounds are normal. There is no distension.     Palpations: Abdomen is soft. There is no mass.     Tenderness: There is no abdominal tenderness. There is no guarding or rebound.  Musculoskeletal:        General: No tenderness or edema. Normal range  of motion.     Cervical back: Normal range of motion and neck supple.  Skin:    General: Skin is warm.  Psychiatric:        Mood and Affect: Affect normal.      LABORATORY DATA:  I have reviewed the data as listed Lab Results  Component Value Date   WBC 4.9 02/17/2020    HGB 8.4 (L) 02/17/2020   HCT 26.6 (L) 02/17/2020   MCV 92.7 02/17/2020   PLT 34 (L) 02/17/2020   Recent Labs    03/28/19 1416 12/06/19 1630 12/07/19 1015 01/26/20 1300 01/27/20 0718 01/28/20 1414 01/29/20 0533 01/30/20 0654 01/28/2020 1907 02/16/20 0749 02/16/20 1525 02/17/20 0715  NA 143 141   < > 134*   < >  --  137   < > 153* 152* 152* 158*  K 5.3* 4.4   < > 5.1   < >  --  4.1   < > 3.6 3.7 3.4* 2.9*  CL 105 98   < > 94*   < >  --  94*   < > 101 103 105 109  CO2 22 28   < > 26   < >  --  30   < > 34* 32 33* 34*  GLUCOSE 183* 155*   < > 150*   < >  --  202*   < > 213* 233* 296* 127*  BUN 30* 54*   < > 45*   < >  --  91*   < > 69* 75* 79* 74*  CREATININE 1.49* 2.32*   < > 2.35*   < >  --  2.18*   < > 1.70* 1.92* 2.05* 1.81*  CALCIUM 9.0 9.0   < > 9.0   < >  --  8.9   < > 8.7* 8.0* 8.1* 8.3*  GFRNONAA 45* 26*   < > 28*   < >  --  30*   < > 41* 35* 33* 38*  GFRAA 52* 30*  --   --   --   --   --   --   --   --   --   --   PROT  --   --    < > 7.1   < >  --  6.6   < > 6.5  --  5.4* 5.8*  ALBUMIN  --   --    < > 3.9   < >  --  3.4*   < > 2.8*  --  2.3* 2.3*  AST  --   --    < > 26   < >  --  42*   < > 32  --  30 35  ALT  --   --    < > 24   < >  --  107*   < > 25  --  21 22  ALKPHOS  --   --    < > 132*   < >  --  94   < > 107  --  84 82  BILITOT  --   --    < > 5.7*   < > 5.2* 4.1*   < > 4.2*  --  3.9* 3.8*  BILIDIR  --   --    < > 2.3*  --  2.4* 1.9*  --   --   --   --   --   IBILI  --   --    < >  3.4*  --  2.8* 2.2*  --   --   --   --   --    < > = values in this interval not displayed.    RADIOGRAPHIC STUDIES: I have personally reviewed the radiological images as listed and agreed with the findings in the report. DG Chest 1 View  Result Date: 01/30/2020 CLINICAL DATA:  Increasing lower extremity edema EXAM: CHEST  1 VIEW COMPARISON:  01/26/2020 FINDINGS: Cardiac shadow is enlarged. Aortic calcifications are noted. Postsurgical changes are again seen. Pacing device is again  noted. Increasing vascular congestion with interstitial edema is noted worsened from the prior exam. Small left pleural effusion is noted. No bony abnormality is seen. IMPRESSION: Increasing CHF when compared with the prior exam Electronically Signed   By: Inez Catalina M.D.   On: 01/30/2020 09:08   DG Chest 2 View  Result Date: 02/21/2020 CLINICAL DATA:  Altered level of consciousness EXAM: CHEST - 2 VIEW COMPARISON:  01/30/2020 FINDINGS: Frontal and lateral views of the chest demonstrate stable single lead pacemaker. 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 Cardiac silhouette is mildly enlarged but stable. There is persistent central vascular congestion, with patchy left basilar airspace disease and small left effusion. No pneumothorax. No acute bony abnormalities. IMPRESSION: 1. Mild congestive heart failure, without significant change since previous. Electronically Signed   By: Randa Ngo M.D.   On: 02/06/2020 18:58   DG Chest 2 View  Result Date: 01/26/2020 CLINICAL DATA:  Shortness of breath EXAM: CHEST - 2 VIEW COMPARISON:  December 07, 2019 FINDINGS: The mediastinal contour and cardiac silhouette are stable. Cardiac pacemaker is unchanged. The heart size is enlarged. Diffuse increased pulmonary interstitium is identified bilaterally. There is no pleural effusion or focal pneumonia. The bony structures are stable. IMPRESSION: Congestive heart failure. Electronically Signed   By: Abelardo Diesel M.D.   On: 01/26/2020 15:51   DG Knee 1-2 Views Right  Result Date: 01/31/2020 CLINICAL DATA:  Right knee pain and swelling. EXAM: RIGHT KNEE - 1-2 VIEW COMPARISON:  Plain films right knee 01/26/2020. FINDINGS: Small joint effusion is unchanged. There is no acute bony or joint abnormality. Joint spaces are preserved but there is osteophytosis about the which is most notable in the patellofemoral and lateral compartments. Chondrocalcinosis is again seen. Vascular clips the posterior soft  tissues medially again noted. Atherosclerosis. IMPRESSION: No acute abnormality. No change in a small knee joint effusion. Mild-to-moderate osteoarthritis. Chondrocalcinosis. Electronically Signed   By: Inge Rise M.D.   On: 01/31/2020 10:44   DG Knee 2 Views Right  Result Date: 01/26/2020 CLINICAL DATA:  Bilateral leg weakness EXAM: RIGHT KNEE - 1-2 VIEW COMPARISON:  None. FINDINGS: Degenerative changes with joint space narrowing and spurring most pronounced in the patellofemoral compartment. Small joint effusion. No acute bony abnormality. Specifically, no fracture, subluxation, or dislocation. Dense vascular calcifications. IMPRESSION: Degenerative changes as above. Small joint effusion. No acute bony abnormality. Electronically Signed   By: Rolm Baptise M.D.   On: 01/26/2020 14:02   CT Head Wo Contrast  Result Date: 02/19/2020 CLINICAL DATA:  80 year old male with history of altered mental status. EXAM: CT HEAD WITHOUT CONTRAST TECHNIQUE: Contiguous axial images were obtained from the base of the skull through the vertex without intravenous contrast. COMPARISON:  Head CT 08/24/2015.  Brain MRI 08/24/2015. 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 No  evidence of acute infarction, hemorrhage, hydrocephalus, extra-axial collection or mass lesion/mass effect. Vascular: No hyperdense vessel or unexpected calcification. Skull: Normal. Negative for fracture or focal lesion. Sinuses/Orbits: No acute finding. Other: None. IMPRESSION: 1. No acute intracranial abnormalities. 2. 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 Electronically Signed   By: Vinnie Langton M.D.   On: 02/10/2020 19:36   US RENAL  Result Date: 01/30/2020 CLINICAL DATA:  Worsening renal function. EXAM: RENAL / URINARY TRACT ULTRASOUND  COMPLETE COMPARISON:  None. FINDINGS: Right Kidney: Renal measurements: 10.9 x 6.7 x 6.3 cm = volume: 242 mL. Increased echogenicity of renal parenchyma is noted suggesting medical renal disease. No mass or hydronephrosis visualized. Left Kidney: Renal measurements: 10.9 x 6.5 x 5.5 cm = volume: 204 mL. Increased echogenicity of renal parenchyma is noted suggesting medical renal disease. No mass or hydronephrosis visualized. Bladder: Appears normal for degree of bladder distention. Other: None. IMPRESSION: Increased echogenicity of renal parenchyma is noted suggesting medical renal disease. No hydronephrosis or renal obstruction is noted. Electronically Signed   By: Marijo Conception M.D.   On: 01/30/2020 09:01   DG Chest Port 1 View  Result Date: 02/17/2020 CLINICAL DATA:  Acute respiratory failure EXAM: PORTABLE CHEST 1 VIEW COMPARISON:  02/16/2020 FINDINGS: Right internal jugular central venous catheter tip noted within the right atrium. Lung volumes are small, but are symmetric and are stable since prior examination. Superimposed perihilar and lower lung zone pulmonary infiltrate appears slightly progressive since prior examination. This may represent a component of superimposed pulmonary edema. No pneumothorax. Tiny left pleural effusion suspected. 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 Mild cardiomegaly is stable. 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 IMPRESSION: Progressive pulmonary infiltrates, possibly representing a component of superimposed cardiogenic failure. Probable tiny left pleural effusion. Stable pulmonary hypoinflation. Electronically Signed   By: Fidela Salisbury MD   On: 02/17/2020 06:00   DG CHEST PORT 1 VIEW  Result Date: 02/16/2020 CLINICAL DATA:  Central venous catheter placement. EXAM: PORTABLE CHEST 1 VIEW COMPARISON:  Chest radiograph 02/16/2020. FINDINGS: Right IJ central venous catheter tip projects over the  superior vena cava. Pacer apparatus overlies the left hemithorax. Stable cardiomegaly. Aortic atherosclerosis. Similar-appearing diffuse bilateral airspace opacities. Possible small left pleural effusion. No pneumothorax. Aortic valve replacement. IMPRESSION: Right IJ central venous catheter tip projects over the superior vena cava. Similar-appearing diffuse bilateral airspace opacities. Electronically Signed   By: Lovey Newcomer M.D.   On: 02/16/2020 07:55   DG Chest Portable 1 View  Result Date: 02/16/2020 CLINICAL DATA:  Central venous catheter placed EXAM: PORTABLE CHEST 1 VIEW COMPARISON:  02/01/2020 FINDINGS: Right internal jugular central venous catheter is in place with its tip within the superior right atrium. No pneumothorax. Pulmonary insufflation has diminished resulting in vascular crowding at the hila. No pneumothorax. Possible tiny left pleural effusion. Coronary artery bypass grafting has been performed. Transcatheter aortic valve noted. Mild cardiomegaly is present. Left subclavian single lead pacemaker is seen with its lead overlying the expected right ventricle. IMPRESSION: Right internal jugular central venous catheter tip within the superior right atrium. No pneumothorax. Electronically Signed   By: Fidela Salisbury MD   On: 02/16/2020 05:07    MDS (myelodysplastic syndrome) [facility]) 80 year old male patient with multiple medical problems including congestive heart failure/chronic respiratory failure A. fib on anticoagulation-also low-grade MDS 5q deletion currently admitted to hospital for acute on chronic congestive heart failure/enterococcal bacteremia/sepsis-with severe anemia hemoglobin  5-6.  # MDS: low grade-hemoglobin six; platelets 30s to 40s [baseline 60s to 70s]-worsening blood counts likely secondary to underlying infection/congestive heart failure  #ID: Covid positive infection sepsis/enterococcal bacteremia-question source of infection. On broad-spectrum antibiotic/remdesivir  steroids. Awaiting ID  #Acute on chronic congestive heart failure/A. Fib-on Eliquis.  # Recommendations:  #Recommend PRBC transfusion for hemoglobin less than 8.  Recommend holding Eliquis if platelets less than 30,000.  #With regards to prognosis from [place] expectancy is in years.  However patient has multiple other medical problems including congestive heart failure/CKD/poor performance status.  He has also had multiple admission to hospital in the last few months-which is again a poor prognostic feature.  In this admission patient is acutely sick with enterococcal bacteremia/sepsis and also congestive heart failure.  Unfortunately patient overall prognosis extremely poor.  Agree with palliative care evaluation.  Discussed with Dr. Leslye Peer.  Thank you Dr.Wieting for allowing me to participate in the care of your pleasant patient. Please do not hesitate to contact me with questions or concerns in the interim.  All questions were answered. The patient knows to call the clinic with any problems, questions or concerns.    Cammie Sickle, MD 02/17/2020 1:08 PM

## 2020-02-17 NOTE — Assessment & Plan Note (Signed)
80 year old male patient with multiple medical problems including congestive heart failure/chronic respiratory failure A. fib on anticoagulation-also low-grade MDS 5q deletion currently admitted to hospital for acute on chronic congestive heart failure/enterococcal bacteremia/sepsis-with severe anemia hemoglobin 5-6.  # MDS: low grade-hemoglobin six; platelets 30s to 40s [baseline 60s to 70s]-worsening blood counts likely secondary to underlying infection/congestive heart failure  #ID: Covid positive infection sepsis/enterococcal bacteremia-question source of infection. On broad-spectrum antibiotic/remdesivir steroids. Awaiting ID  #Acute on chronic congestive heart failure/A. Fib-on Eliquis.  # Recommendations:  #Recommend PRBC transfusion for hemoglobin less than 8.  Recommend holding Eliquis if platelets less than 30,000.  #With regards to prognosis from [place] expectancy is in years.  However patient has multiple other medical problems including congestive heart failure/CKD/poor performance status.  He has also had multiple admission to hospital in the last few months-which is again a poor prognostic feature.  In this admission patient is acutely sick with enterococcal bacteremia/sepsis and also congestive heart failure.  Unfortunately patient overall prognosis extremely poor.  Agree with palliative care evaluation.  Discussed with Dr. Leslye Peer.  Thank you Dr.Wieting for allowing me to participate in the care of your pleasant patient. Please do not hesitate to contact me with questions or concerns in the interim.

## 2020-02-17 NOTE — Progress Notes (Signed)
CRITICAL CARE NOTE  SYNOPSIS  80 year old male presenting to the ED with poor p.o. take confusion and generalized weakness to the point of being unable to ambulate.  COVID 19 + sCHF+Liver cirrhosis  ED course: On initial vitals patient was afebrile, BP 119/76, HR 66, tachypneic in the 20's with SPO2 at 100% on 4 L nasal cannula.  Labs significant for: Hemoglobin 5.7 (was 8.1- 11 days prior), platelets 46,000 (reduced from 69,000), sodium 153, creatinine 1.77 (prior 1.16), ammonia 38, troponin 210, BNP 2000, COVID PCR positive.  CT head showed no acute abnormalities.  Patient was started on a blood transfusion 2 units PRBCs.  Hospitalist service consulted for admission. Patient remains roomed in emergency department while under hospitalist service pending admission.  Patient became hypoxic requiring 6 L nasal cannula.  BP also dropped with pressures in the high [id number] low 0000000 systolically maps [id number].  Lactic increased from 2.1-6.6.  Sepsis criteria initiated, PCCM consulted for further management and monitoring due to worsening respiratory status and possible impending shock.  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  SpO2: 96 % O2 Flow Rate (L/min): 40 L/min FiO2 (%): 70 %  Estimated body mass index is 31.1 kg/m as calculated from the following:   Height as of this encounter: 6' (1.829 m).   Weight as of this encounter: 104 kg.  SIGNIFICANT EVENTS   REVIEW OF SYSTEMS  PATIENT IS UNABLE TO PROVIDE COMPLETE REVIEW OF SYSTEMS DUE TO SEVERE CRITICAL ILLNESS   Pressure Injury 02/16/20 Sacrum Mid Stage 1 -  Intact skin with non-blanchable redness of a localized area usually over a bony prominence. (Active)  02/16/20 1000  Location: Sacrum  Location Orientation: Mid  Staging: Stage 1 -  Intact skin with non-blanchable redness of a localized area usually over a bony prominence.  Wound Description (Comments):   Present on Admission: Yes      PHYSICAL EXAMINATION:  GENERAL:critically ill appearing, +resp distress HEAD: Normocephalic, atraumatic.  EYES: Pupils equal, round, reactive to light.  No scleral icterus.  MOUTH: Moist mucosal membrane. NECK: Supple.  PULMONARY: +rhonchi, +wheezing CARDIOVASCULAR: S1 and S2. Regular rate and rhythm. No murmurs, rubs, or gallops.  GASTROINTESTINAL: Soft, nontender, -distended.  Positive bowel sounds.   MUSCULOSKELETAL: No swelling, clubbing, or edema.  NEUROLOGIC: obtunded, GCS<8 SKIN:intact,warm,dry  MEDICATIONS: I have reviewed all medications and confirmed regimen  as documented   CULTURE RESULTS   Recent Results (from the past 240 hour(s))  SARS Coronavirus 2 by RT PCR (hospital order, performed in [facility] hospital lab) Nasopharyngeal Nasopharyngeal Swab     Status: Abnormal    Collection Time: 02/14/2020  7:07 PM   Specimen: Nasopharyngeal Swab  Result Value Ref Range Status   SARS Coronavirus 2 POSITIVE (A) NEGATIVE Final    Comment: RESULT CALLED TO, READ BACK BY AND VERIFIED WITH: CHRISTINE HALLAS AT 2050 02/09/2020. MF (NOTE) SARS-CoV-2 target nucleic acids are DETECTED  SARS-CoV-2 RNA is generally detectable in upper respiratory specimens  during the acute phase of infection.  Positive results are indicative  of the presence of the identified virus, but do not rule out bacterial infection or co-infection with other pathogens not detected by the test.  Clinical correlation with patient history and  other diagnostic information is necessary to determine patient infection status.  The expected result is negative.  Fact Sheet for Patients:   StrictlyIdeas.no   Fact Sheet for Healthcare Providers:   BankingDealers.co.za    This test is not yet approved or cleared by the [place] FDA and  has been authorized for detection and/or diagnosis of SARS-CoV-2 by FDA under an Emergency Use Authorization (EUA).  This EUA will remain in effect (meaning this  test can be used) for the duration of  the COVID-19 declaration under Section 564(b)(1) of the Act, 21 U.S.C. section 360-bbb-3(b)(1), unless the authorization is terminated or revoked sooner.  Performed at [facility], [address] [place], [place] [address]   CULTURE, BLOOD (ROUTINE X 2) w Reflex to ID Panel     Status: None (Preliminary result)   Collection Time: 02/16/20  2:11 AM   Specimen: BLOOD  Result Value Ref Range Status   Specimen Description BLOOD LEFT ANTECUBITAL  Final   Special Requests   Final    BOTTLES DRAWN AEROBIC AND ANAEROBIC Blood Culture results may not be optimal due to an excessive volume of blood received in culture bottles   Culture  Setup Time   Final    IN BOTH AEROBIC AND ANAEROBIC BOTTLES GRAM POSITIVE  COCCI CRITICAL VALUE NOTED.  VALUE IS CONSISTENT WITH PREVIOUSLY REPORTED AND CALLED VALUE. Performed at [facility], [address] [place], [place] [address]    Culture [facility] POSITIVE COCCI  Final   Report Status PENDING  Incomplete  CULTURE, BLOOD (ROUTINE X 2) w Reflex to ID Panel     Status: Abnormal (Preliminary result)   Collection Time: 02/16/20  2:11 AM   Specimen: BLOOD  Result Value Ref Range Status   Specimen Description   Final    BLOOD BLOOD RIGHT FOREARM Performed at [facility], [address], [place], [place] [address]    Special Requests   Final    BOTTLES DRAWN AEROBIC AND ANAEROBIC Blood Culture adequate volume Performed at [facility], [address] [place], [place] [address]    Culture  Setup Time   Final    IN BOTH AEROBIC AND ANAEROBIC BOTTLES GRAM POSITIVE COCCI CRITICAL RESULT CALLED TO, READ BACK BY AND VERIFIED WITH: BRANDON BEERS 02/16/20 AT 1229 BY ACR    Culture (A)  Final    ENTEROCOCCUS FAECALIS SUSCEPTIBILITIES TO FOLLOW Performed at [facility], [place] [address] [place], [place] [address]    Report Status PENDING  Incomplete  Blood Culture ID Panel (Reflexed)     Status: Abnormal   Collection Time:  02/16/20  2:11 AM  Result Value Ref Range Status   Enterococcus faecalis DETECTED (A) NOT DETECTED Final    Comment: CRITICAL RESULT CALLED TO, READ BACK BY AND VERIFIED WITH: BRANDON BEERS 02/16/20 AT 1229 BY ACR    Enterococcus Faecium NOT DETECTED NOT DETECTED Final   Listeria monocytogenes NOT DETECTED NOT DETECTED Final   Staphylococcus species NOT DETECTED NOT DETECTED Final   Staphylococcus aureus (BCID) NOT DETECTED NOT DETECTED Final   Staphylococcus epidermidis NOT DETECTED NOT DETECTED Final   Staphylococcus lugdunensis NOT DETECTED NOT DETECTED Final   Streptococcus species NOT DETECTED NOT DETECTED Final   Streptococcus agalactiae NOT DETECTED NOT DETECTED Final   Streptococcus pneumoniae  NOT DETECTED NOT DETECTED Final   Streptococcus pyogenes NOT DETECTED NOT DETECTED Final   A.calcoaceticus-baumannii NOT DETECTED NOT DETECTED Final   Bacteroides fragilis NOT DETECTED NOT DETECTED Final   Enterobacterales NOT DETECTED NOT DETECTED Final   Enterobacter cloacae complex NOT DETECTED NOT DETECTED Final   Escherichia coli NOT DETECTED NOT DETECTED Final   Klebsiella aerogenes NOT DETECTED NOT DETECTED Final   Klebsiella oxytoca NOT DETECTED NOT DETECTED Final   Klebsiella pneumoniae NOT DETECTED NOT DETECTED Final   Proteus species NOT DETECTED NOT DETECTED Final   Salmonella species NOT DETECTED NOT DETECTED Final   Serratia marcescens NOT DETECTED NOT DETECTED Final   Haemophilus influenzae NOT DETECTED NOT DETECTED Final   Neisseria meningitidis NOT DETECTED NOT DETECTED Final   Pseudomonas aeruginosa NOT DETECTED NOT DETECTED Final   Stenotrophomonas maltophilia NOT DETECTED NOT DETECTED Final   Candida albicans NOT DETECTED NOT DETECTED Final   Candida auris NOT DETECTED NOT DETECTED Final   Candida glabrata NOT DETECTED NOT DETECTED Final   Candida krusei NOT DETECTED NOT DETECTED Final   Candida parapsilosis NOT DETECTED NOT DETECTED Final   Candida tropicalis NOT DETECTED NOT DETECTED Final   Cryptococcus neoformans/gattii NOT DETECTED NOT DETECTED Final   Vancomycin resistance NOT DETECTED NOT DETECTED Final    Comment: Performed at [facility], [address] [place], [place] [address]  CULTURE, BLOOD (ROUTINE X 2) w Reflex to ID Panel     Status: None (Preliminary result)   Collection Time: 02/17/20  2:16 AM   Specimen: BLOOD  Result Value Ref Range Status   Specimen Description BLOOD BLOOD LEFT HAND  Final   Special Requests   Final    BOTTLES DRAWN AEROBIC AND ANAEROBIC Blood Culture adequate volume   Culture   Final    NO GROWTH < 12 HOURS Performed at [facility], [address] [place], [place] [address]    Report Status  PENDING  Incomplete  CULTURE, BLOOD (ROUTINE X 2) w Reflex to ID Panel     Status: None (Preliminary result)   Collection Time: 02/17/20  2:16 AM   Specimen: BLOOD  Result Value Ref Range Status   Specimen Description BLOOD BLOOD RIGHT HAND  Final   Special Requests   Final    BOTTLES DRAWN AEROBIC AND ANAEROBIC Blood Culture adequate volume   Culture   Final    NO GROWTH < 12 HOURS Performed at [facility], [address] [place], [place] [address]    Report Status PENDING  Incomplete  MRSA PCR Screening     Status: None   Collection Time: 02/17/20  7:15 AM  Result Value Ref Range Status   MRSA by PCR NEGATIVE NEGATIVE Final    Comment:        The  GeneXpert MRSA Assay (FDA approved for NASAL specimens only), is one component of a comprehensive MRSA colonization surveillance program. It is not intended to diagnose MRSA infection nor to guide or monitor treatment for MRSA infections. Performed at [facility], [address] [place], [place] [address]           IMAGING    DG Chest Port 1 View  Result Date: 02/17/2020 CLINICAL DATA:  Acute respiratory failure EXAM: PORTABLE CHEST 1 VIEW COMPARISON:  02/16/2020 FINDINGS: Right internal jugular central venous catheter tip noted within the right atrium. Lung volumes are small, but are symmetric and are stable since prior examination. Superimposed perihilar and lower lung zone pulmonary infiltrate appears slightly progressive since prior examination. This may represent a component of superimposed pulmonary edema. No pneumothorax. Tiny left pleural effusion suspected. 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 Mild cardiomegaly is stable. 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 IMPRESSION: Progressive pulmonary infiltrates, possibly representing a component of superimposed cardiogenic failure. Probable tiny left pleural effusion. Stable pulmonary  hypoinflation. Electronically Signed   By: Fidela Salisbury MD   On: 02/17/2020 06:00     Nutrition Status: Nutrition Problem: Increased nutrient needs Etiology: catabolic illness (COVID 19, CHF, Myelodysplastic syndrome, cirrhosis) Signs/Symptoms: estimated needs    BMP Latest Ref Rng & Units 02/17/2020 02/16/2020 02/16/2020  Glucose 70 - 99 mg/dL 127(H) 296(H) 233(H)  BUN 8 - 23 mg/dL 74(H) 79(H) 75(H)  Creatinine 0.61 - 1.24 mg/dL 1.81(H) 2.05(H) 1.92(H)  BUN/Creat Ratio 10 - 24 - - -  Sodium 135 - 145 mmol/L 158(H) 152(H) 152(H)  Potassium 3.5 - 5.1 mmol/L 2.9(L) 3.4(L) 3.7  Chloride 98 - 111 mmol/L 109 105 103  CO2 22 - 32 mmol/L 34(H) 33(H) 32  Calcium 8.9 - 10.3 mg/dL 8.3(L) 8.1(L) 8.0(L)      Indwelling Urinary Catheter continued, requirement due to   Reason to continue Indwelling Urinary Catheter strict Intake/Output monitoring for hemodynamic instability   Central Line/ continued, requirement due to  Reason to continue [place] of central venous pressure or other hemodynamic parameters and poor IV access       ASSESSMENT AND PLAN SYNOPSIS Acute hypoxic respiratory distress in the setting of [id number]  ACUTE SYSTOLIC CARDIAC FAILURE- EF -oxygen as needed -Lasix as tolerated  Morbid obesity, possible OSA.   Will certainly impact respiratory mechanics  ACUTE KIDNEY INJURY/Renal Failure -continue Foley Catheter-assess need -Avoid nephrotoxic agents -Follow urine output, BMP -Ensure adequate renal perfusion, optimize oxygenation -Renal dose medications     NEUROLOGY Acute toxic metabolic encephalopathy More responsive today  [place] ICU monitoring  INFECTIOUS DISEASE -continue antibiotics as prescribed -follow up cultures -follow up ID consultation  NOT A CANDIDATE FOR ANTICOAGULATION  GI GI PROPHYLAXIS as indicated   DIET-->SPEECH EVAL Constipation protocol as indicated  ENDO - will use ICU  hypoglycemic\Hyperglycemia protocol if indicated     ELECTROLYTES -follow labs as needed -replace as needed -pharmacy consultation and following   DVT/GI PRX ordered and assessed TRANSFUSIONS AS NEEDED MONITOR FSBS I Assessed the need for Labs I Assessed the need for Foley I Assessed the need for Central Venous Line Family Discussion when available I Assessed the need for Mobilization I made an Assessment of medications to be adjusted accordingly Safety Risk assessment completed   CASE DISCUSSED IN MULTIDISCIPLINARY ROUNDS WITH ICU TEAM    Elliemae Braman Patricia Pesa, M.D.  [facility] Pulmonary & Critical Care Medicine  Medical  Director [place] Director [facility] Cardio-Pulmonary Department

## 2020-02-17 NOTE — Telephone Encounter (Signed)
Pt's wife called to cancel tomorrow's appts. Patient is currently admitted (ICU) and is COVID +. Wife states she will call to r/s once pt has been discharged and fully recovered.

## 2020-02-17 NOTE — Consult Note (Addendum)
Cardiology Consultation:   Due to the COVID-19 pandemic, this visit was completed with telemedicine (audio/video) technology to reduce patient and provider exposure as well as to preserve personal protective equipment.   Patient ID: Evan [name] MRN: [id number]; DOB: 04/17/1940  Admit date: 02/13/2020 Date of Consult: 02/17/2020  Primary Care Provider: Leonel Ramsay, MD Primary Cardiologist: [name] [name], MD  Primary Electrophysiologist:  Will Meredith Leeds, MD    Patient Profile:   Evan [name] is a 80 y.o. male with a hx of CAD s/p CABG in 2005, carotid artery disease, hypertension, hyperlipidemia, diabetes type 2, permanent atrial fibrillation, stroke in 2017 with hemorrhagic transformation, pacemaker and severe AS s/p TAVR who is being seen today for the evaluation for possible TEE at the request of Dr. Leslye Peer.  History of Present Illness:   History obtained through chart review given COVID status and AMS.   Evan [name] is followed by Dr. [name] and Dr. Curt Bears for the above cardiac issues.  Patient had a cardiac catheterization in 2007 demonstrating patent grafts.  Myoview in 2013 showed no significant abnormality.  He had a left carotid endarterectomy in May 2014.  He has an intolerance to statins due to severe myalgia and weakness.  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  Anticoagulation was initially held, and he was treated with aspirin before restarting Eliquis at a later time.  Sleep study in October 2017 showed borderline sleep apnea with minimal desaturation.  Echo in February 2019 showed severe AAS.  Dr. Curt Bears recommended permanent pacemaker due to severe conduction disease which was placed near the end of February with a [facility] Jude MRI compatible dual-chamber pacemaker in 03/15/2017.  He underwent cardiac catheterization on 03/23/2017 which showed severe three-vessel CAD with patent LIMA to LAD, continue patency of free  radial graft to OM with tight stenosis at the distal anastomosis involving a small caliber vessel, patent SVG to PDA, severe AS.  He was evaluated Dr. Burt Knack and underwent TAVR with Angelina Sheriff THV size 26 mm bioprosthetic valve by Dr. Cyndia Bent in April 2019.  Follow-up echo in May showed good results.  Aspirin was discontinued to allow the patient to stay on Eliquis long-term.  Patient has been seen by oncology for microcytic anemia.  CT in 2021 showed possible cirrhosis however work-up was unremarkable.  Lung biopsy was considered however patient declined.  Was admitted in November 2021 shortness of breath found to have a hemoglobin of 6.  He underwent transfusion and diuresis.  Bone marrow biopsy showed myelodysplasia.  Eliquis was held.  Benazepril and HCTZ held for hypotension and AKI.  Creatinine stabilized at 2.1.  Hemoglobin improved to 9.  At that time showed EF 45 to 50%, pulmonary hypertension, stable TAVR function.  He was discharged on Lasix 40 mg daily.  Was seen in follow-up and felt to be mildly volume overloaded and Lasix was increased for couple days.  He was seen back in the office 01/08/2020 and remained volume overloaded on exam and Lasix was increased again.  Showed he was anemic.  Oncology was considering starting Revlimid, but that he will need to restart Eliquis, which was eventually started soon after.  Was hospitalized 1/2-1/11 respiratory failure secondary to systolic heart failure complicated by acute gout flare of right knee requiring arthrocentesis.  Patient's wife called on 02/17/2020 reporting decreased oral intake, decreased appetite for 3 weeks, shortness of breath, orthopnea, progressive weakness, chest discomfort.  It was recommended patient be  evaluated in the ED.  Patient presented to the ED later that day 01/28/2020 with weakness, confusion, poor oral intake.  Also reported a mild cough but no significant shortness of breath.  No chest pain, no abdominal pain vomiting  or diarrhea.  Shortly lower leg edema was improving however recently got worse.  Increasing Lasix did not seem to help.  Reported a few bloody bowel movements.  The ED patient was afebrile and tachypneic.  Placed on 4 L nasal cannula.  Blood work showed hemoglobin 5.7, platelet count 40 6K, sodium 153, creatinine 1.77, ammonia 38, BNP 2000, troponin II 10.  Covid PCR positive.  Chest x-ray showed mild congestive heart failure not significant change since 01/30/2020. She was transfused 2 units PRBCs he was admitted for further work-up.  The ER patient became hypoxic requiring 6 L nasal cannula.  Blood pressure also dropped with systolics in the 38L 37D, map 50-65.  Lactic acid noted to increase from 2.1-6.6.  Sepsis criteria initiated and PCCM was consulted for further management due to worsening respiratory status and possible shock.   Past Medical History:  Diagnosis Date  . Anemia   . Carotid arterial disease ([place])   . CHF (congestive heart failure) ([place])   . Coronary artery disease   . Hearing loss   . HOH (hard of hearing)   . Hyperlipidemia   . Hypertension   . Nocturia   . Obesity   . Osteoarthritis    "BUE; BLE; right knee" (03/23/2017)  . Peripheral vision loss, bilateral    S/P 07/2015  . Permanent atrial fibrillation ([place])   . 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 Severe aortic stenosis   . Sinus drainage   . Stroke [facility]) 07/2015   "peripheral vision still bad out of left eye" (03/23/2017)  . Torn rotator cuff 2012   Right arm  . Type II diabetes mellitus ([place])   . Urinary frequency     Past Surgical History:  Procedure Laterality Date  . BACK SURGERY    . CARDIAC CATHETERIZATION  11/29/2005   SEVERE THREE VESSEL OBSTRUCTIVE ATHERSCLEROTIC CAD. ALL GRAFTS WERE PATENT. EF 65%  . CARDIAC CATHETERIZATION  03/23/2017  . CAROTID ENDARTERECTOMY Right 01-29-08   cea  . CARPAL TUNNEL RELEASE Right   . COLONOSCOPY N/A 12/10/2019   Procedure: COLONOSCOPY;   Surgeon: Wilford Corner, MD;  Location: [facility] ENDOSCOPY;  Service: Endoscopy;  Laterality: N/A;  . CORONARY ARTERY BYPASS GRAFT  03/2003   X3. LIMA GRAFT TO THE LAD, SAPHENOUS VEIN GRAFT TO THE PA, AND A LEFT RADIAL GRAFT TO THEOBTUSE MARGINAL VESSEL  . ENDARTERECTOMY Left 05/28/2012   Procedure: ENDARTERECTOMY CAROTID;  Surgeon: Elam Dutch, MD;  Location: [place];  Service: Vascular;  Laterality: Left;  . ESOPHAGOGASTRODUODENOSCOPY N/A 12/10/2019   Procedure: ESOPHAGOGASTRODUODENOSCOPY (EGD);  Surgeon: Wilford Corner, MD;  Location: [place];  Service: Endoscopy;  Laterality: N/A;  . HEMOSTASIS CLIP PLACEMENT  12/10/2019   Procedure: HEMOSTASIS CLIP PLACEMENT;  Surgeon: Wilford Corner, MD;  Location: [place] ENDOSCOPY;  Service: Endoscopy;;  . INSERT / REPLACE / REMOVE PACEMAKER  03/23/2017  . [facility]; 1984  . MOLE SURGERY     "? face/arms"  . PACEMAKER IMPLANT N/A 03/23/2017   Procedure: PACEMAKER IMPLANT;  Surgeon: Constance Haw, MD;  Location: [place] CV LAB;  Service: Cardiovascular;  Laterality: N/A;  . PATCH ANGIOPLASTY Left 05/28/2012   Procedure: WITH dACRON PATCH ANGIOPLASTY;  Surgeon: Elam Dutch, MD;  Location: [facility] OR;  Service: Vascular;  Laterality: Left;  . POLYPECTOMY  12/10/2019   Procedure: POLYPECTOMY;  Surgeon: Wilford Corner, MD;  Location: [facility] ENDOSCOPY;  Service: Endoscopy;;  . RIGHT/LEFT HEART CATH AND CORONARY/GRAFT ANGIOGRAPHY N/A 03/23/2017   Procedure: RIGHT/LEFT HEART CATH AND CORONARY/GRAFT ANGIOGRAPHY;  Surgeon: Sherren Mocha, MD;  Location: [place] CV LAB;  Service: Cardiovascular;  Laterality: N/A;  . TEE WITHOUT CARDIOVERSION N/A 05/09/2017   Procedure: TRANSESOPHAGEAL ECHOCARDIOGRAM (TEE);  Surgeon: Sherren Mocha, MD;  Location: [place];  Service: Open Heart Surgery;  Laterality: N/A;  . TRANSCATHETER AORTIC VALVE REPLACEMENT, TRANSFEMORAL N/A 05/09/2017   Procedure: TRANSCATHETER AORTIC VALVE REPLACEMENT,  TRANSFEMORAL using an Edwards 78m Sapien 3 Aortic Valve;  Surgeon: CSherren Mocha MD;  [place]  Service: Open Heart Surgery;  Laterality: N/A;     Home Medications:  Prior to Admission medications   Medication Sig Start Date End Date Taking? Authorizing Provider  allopurinol (ZYLOPRIM) 100 MG tablet Take 1 tablet (100 mg total) by mouth daily. 02/04/20 02/03/21 Yes Pahwani, Rinka R, MD  acetaminophen (TYLENOL) 650 MG CR tablet Take 1,300 mg by mouth every 8 (eight) hours as needed for pain.     [provider]  amLODipine (NORVASC) 2.5 MG tablet Take 1 tablet (2.5 mg total) by mouth daily. 11/27/19 02/25/20  JMartinique [name] M, MD  apixaban (ELIQUIS) 5 MG TABS tablet Take 1 tablet (5 mg total) by mouth 2 (two) times daily. 01/09/20 07/07/20  MAlmyra Deforest PA  benazepril (LOTENSIN) 40 MG tablet Take 40 mg by mouth daily. Patient not taking: No sig reported 01/20/20   [provider]  cyanocobalamin 1000 MCG tablet Take 1,000 mcg by mouth daily.     [provider]  docusate sodium (COLACE) 100 MG capsule Take 1 capsule (100 mg total) by mouth daily. Patient not taking: Reported on 02/16/2020 02/05/20   Pahwani, RMichell Heinrich MD  ezetimibe (ZETIA) 10 MG tablet TAKE 1 TABLET BY MOUTH EVERY DAY Patient taking differently: Take 10 mg by mouth daily. 01/08/19   JMartinique [name] M, MD  furosemide (LASIX) 80 MG tablet Take 80 mg by mouth 2 (two) times daily.    [provider]  glimepiride (AMARYL) 2 MG tablet TAKE 1 TABLET (2 MG TOTAL) BY MOUTH DAILY WITH BREAKFAST Patient taking differently: Take 2 mg by mouth daily with breakfast. 01/25/18   JMartinique [name] M, MD  hydrALAZINE (APRESOLINE) 25 MG tablet Take 1 tablet (25 mg total) by mouth 2 (two) times daily. 06/10/19   JMartinique [name] M, MD  lenalidomide (REVLIMID) 5 MG capsule Take 1 capsule (5 mg total) by mouth daily. CFanny Dance# [id number]    Date Obtained 01/01/2020 Patient not taking: Reported on 02/16/2020 01/01/20    BCammie Sickle MD  loratadine (CLARITIN) 10 MG tablet Take 10 mg by mouth daily.    [provider]  pantoprazole (PROTONIX) 40 MG tablet Take 1 tablet (40 mg total) by mouth daily. Patient not taking: Reported on 02/16/2020 01/14/20   JMartinique [name] M, MD  polyethylene glycol (MIRALAX / GLYCOLAX) 17 g packet Take 17 g by mouth daily. Patient not taking: Reported on 02/16/2020 02/04/20   PMckinley Jewel MD  Polyvinyl Alcohol-Povidone (REFRESH OP) Place 1 drop into both eyes daily as needed (for dry eyes).     [provider]  potassium chloride SA (KLOR-CON) 20 MEQ tablet Take 1 tablet (20 mEq total) by mouth 2 (two) times daily. 01/08/20   MEulas Post  Isaac Laud, [place]  prochlorperazine (COMPAZINE) 10 MG tablet Take 1 tablet (10 mg total) by mouth every 6 (six) hours as needed for nausea or vomiting. 01/06/20   Cammie Sickle, MD  tamsulosin (FLOMAX) 0.4 MG CAPS capsule TAKE 1 CAPSULE BY MOUTH ONCE DAILY Patient taking differently: Take 0.4 mg by mouth daily. 09/21/15   [name], [name] M, MD  traZODone (DESYREL) 50 MG tablet Take 50 mg by mouth at bedtime. Patient not taking: No sig reported 01/17/20   [provider]    Inpatient Medications: Scheduled Meds: . sodium chloride   Intravenous Once  . sodium chloride   Intravenous Once  . allopurinol  100 mg Oral Daily  . Chlorhexidine Gluconate Cloth  6 each Topical Daily  . docusate sodium  100 mg Oral Daily  . feeding supplement (NEPRO CARB STEADY)  237 mL Oral TID BM  . insulin aspart  0-20 Units Subcutaneous Q4H  . methylPREDNISolone (SOLU-MEDROL) injection  40 mg Intravenous Q12H  . multivitamin with minerals  1 tablet Oral Daily  . pantoprazole (PROTONIX) IV  40 mg Intravenous Q24H  . tamsulosin  0.4 mg Oral Daily   Continuous Infusions: . sodium chloride 100 mL/hr at 02/17/20 0944  . sodium chloride    . sodium chloride    . ampicillin (OMNIPEN) IV 2 g (02/17/20 0817)  . cefTRIAXone (ROCEPHIN)  IV 2 g  (02/17/20 0941)  . potassium chloride    . remdesivir 100 mg in NS 100 mL     PRN Meds: acetaminophen **OR** acetaminophen, ondansetron **OR** ondansetron (ZOFRAN) IV  Allergies:    Allergies  Allergen Reactions  . Niacin Other (See Comments) and Rash    Red face and burns.  . Niacin And Related Rash and Other (See Comments)    Red face and burns.  . Metformin And Related Diarrhea  . Statins Other (See Comments)    Myalgias, weakness    Social History:   Social History   Socioeconomic History  . Marital status: Married    Spouse name: Not on file  . Number of children: 5  . Years of education: Not on file  . Highest education level: Not on file  Occupational History  . Occupation: retired    Fish farm manager: RETIRED  Tobacco Use  . Smoking status: Former Smoker    Packs/day: 2.00    Years: 39.00    Pack years: 78.00    Types: Cigarettes    Quit date: 01/24/1994    Years since quitting: 26.0  . Smokeless tobacco: Never Used  Vaping Use  . Vaping Use: Never used  Substance and Sexual Activity  . Alcohol use: No  . Drug use: No  . Sexual activity: Never  Other Topics Concern  . Not on file  Social History Narrative   On Farm/ in [place]; no alcohol; quit smoking- 96; worked in Estate agent.    Social Determinants of Health   Financial Resource Strain: Not on file  Food Insecurity: Not on file  Transportation Needs: Not on file  Physical Activity: Not on file  Stress: Not on file  Social Connections: Not on file  Intimate Partner Violence: Not on file    Family History:   Family History  Problem Relation Age of Onset  . Heart attack Father   . Heart attack Brother   . Parkinsonism Brother   . Cancer Brother   . Arrhythmia Brother      ROS:  Please see the history of present  illness.  All other ROS reviewed and negative.     Physical Exam/Data:   Vitals:   02/17/20 0518 02/17/20 0600 02/17/20 0700 02/17/20 0800  BP:  (!) 156/62 (!) 164/72    Pulse:  80 89   Resp:  (!) 23 (!) 24   Temp:    (!) 96.9 F (36.1 C)  TempSrc:    Axillary  SpO2: 98% 96% 96%   Weight:      Height:        Intake/Output Summary (Last 24 hours) at 02/17/2020 0949 Last data filed at 02/17/2020 0600 Gross per 24 hour  Intake 1984.98 ml  Output 1450 ml  Net 534.98 ml   Last 3 Weights 02/14/2020 02/04/2020 02/02/2020  Weight (lbs) 229 lb 4.5 oz 228 lb 13.4 oz 229 lb 15 oz  Weight (kg) 104 kg 103.8 kg 104.3 kg     Body mass index is 31.1 kg/m.   VITAL SIGNS:  reviewed  EKG:  The EKG was personally reviewed and demonstrates: pending  Telemetry:  Telemetry was personally reviewed and demonstrates:  Afib, intermittent V-pacing, Hr 80s, PVCs  Relevant CV Studies:  Echo ordered  Echo 11/2019 1. Left ventricular ejection fraction, by estimation, is 45 to 50%. The  left ventricle has mildly decreased function. The left ventricle  demonstrates global hypokinesis. There is moderate concentric left  ventricular hypertrophy. Left ventricular  diastolic function could not be evaluated.  2. Right ventricular systolic function is moderately reduced. The right  ventricular size is moderately enlarged. There is severely elevated  pulmonary artery systolic pressure. The estimated right ventricular  systolic pressure is 43.3 mmHg.  3. Left atrial size was severely dilated.  4. Right atrial size was moderately dilated.  5. The mitral valve is degenerative. Mild mitral valve regurgitation.  Moderate to severe mitral annular calcification.  6. The aortic valve has been repaired/replaced. Aortic valve  regurgitation is not visualized. Procedure Date: 04/29/2017. Echo findings  are consistent with normal structure and function of the aortic valve  prosthesis. Aortic valve mean gradient measures  11.0 mmHg. Aortic valve Vmax measures 2.24 m/s.  7. The inferior vena cava is dilated in size with <50% respiratory  variability, suggesting right atrial  pressure of 15 mmHg.   Comparison(s): Prior images reviewed side by side. The left ventricular  function is worsened. The right ventricular systolic function is worse.  Pulmonary artery systolic pressure and right atrial pressure are higher.  TAVR gradients are improved (possibly  due to lower cardiac output).   R/LHC 2019   Mid RCA lesion is 100% stenosed.  Ost LM to Dist LM lesion is 70% stenosed.  Prox Cx lesion is 90% stenosed.  Ost 2nd Mrg lesion is 100% stenosed.  Prox LAD-1 lesion is 90% stenosed.  Prox LAD-2 lesion is 100% stenosed.  LIMA and is normal in caliber.  The graft exhibits no disease.  SVG graft was visualized by angiography and is normal in caliber.  Left radial artery graft was visualized by angiography.   1.  Severe native three-vessel coronary artery disease with total occlusion of the RCA, severe left main stenosis, total occlusion of the LAD with severe calcification, and severe stenosis of the circumflex and its branch vessels.  2.  Status post aortocoronary bypass surgery with continued patency of the LIMA to LAD, continued patency of the free radial graft to OM with tight stenosis at the distal anastomosis involving a small caliber vessel, and continued patency of the saphenous vein graft  to PDA without any significant disease.  3.  Severe aortic valve stenosis with a mean gradient of 40 mmHg.  The calculated valve area is 1.74 cm likely related to high cardiac output  4.  Moderate pulmonary hypertension with a PA pressure of 72/25 with a mean of 38   Recommendations: The patient will undergo permanent pacemaker placement for symptomatic atrial fibrillation with slow ventricular rate.  He will continue to undergo evaluation by the multidisciplinary heart team for consideration of treatment options for his severe aortic stenosis.     Laboratory Data:  Chemistry Recent Labs  Lab 02/16/20 0749 02/16/20 1525 02/17/20 0715  NA 152*  152* 158*  K 3.7 3.4* 2.9*  CL 103 105 109  CO2 32 33* 34*  GLUCOSE 233* 296* 127*  BUN 75* 79* 74*  CREATININE 1.92* 2.05* 1.81*  CALCIUM 8.0* 8.1* 8.3*  GFRNONAA 35* 33* 38*  ANIONGAP 17* 14 15    Recent Labs  Lab 01/31/2020 1907 02/16/20 1525 02/17/20 0715  PROT 6.5 5.4* 5.8*  ALBUMIN 2.8* 2.3* 2.3*  AST 32 30 35  ALT '25 21 22  ' ALKPHOS 107 84 82  BILITOT 4.2* 3.9* 3.8*   Hematology Recent Labs  Lab 02/16/20 0601 02/16/20 1525 02/17/20 0715  WBC 3.5* 3.8* 4.9  RBC 2.25* 2.51* 2.87*  HGB 6.8* 7.5* 8.4*  HCT 22.0* 23.4* 26.6*  MCV 97.8 93.2 92.7  MCH 30.2 29.9 29.3  MCHC 30.9 32.1 31.6  RDW 25.7* 25.6* 25.4*  PLT 41* 35* 34*   Cardiac EnzymesNo results for input(s): TROPONINI in the last 168 hours. No results for input(s): TROPIPOC in the last 168 hours.  BNP Recent Labs  Lab 02/21/2020 1907  BNP 1,999.6*    DDimer No results for input(s): DDIMER in the last 168 hours.  Radiology/Studies:  DG Chest 2 View  Result Date: 02/13/2020 CLINICAL DATA:  Altered level of consciousness EXAM: CHEST - 2 VIEW COMPARISON:  01/30/2020 FINDINGS: Frontal and lateral views of the chest demonstrate stable single lead pacemaker. 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 Cardiac silhouette is mildly enlarged but stable. There is persistent central vascular congestion, with patchy left basilar airspace disease and small left effusion. No pneumothorax. No acute bony abnormalities. IMPRESSION: 1. Mild congestive heart failure, without significant change since previous. Electronically Signed   By: Randa Ngo M.D.   On: 02/18/2020 18:58   CT Head Wo Contrast  Result Date: 02/22/2020 CLINICAL DATA:  80 year old male with history of altered mental status. EXAM: CT HEAD WITHOUT CONTRAST TECHNIQUE: Contiguous axial images were obtained from the base of the skull through the vertex without intravenous contrast. COMPARISON:  Head CT 08/24/2015.  Brain MRI 08/24/2015.  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 No evidence of acute infarction, hemorrhage, hydrocephalus, extra-axial collection or mass lesion/mass effect. Vascular: No hyperdense vessel or unexpected calcification. Skull: Normal. Negative for fracture or focal lesion. Sinuses/Orbits: No acute finding. Other: None. IMPRESSION: 1. No acute intracranial abnormalities. 2. 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 Electronically Signed   By: Vinnie Langton M.D.   On: 02/04/2020 19:36   DG Chest Port 1 View  Result Date: 02/17/2020 CLINICAL DATA:  Acute respiratory failure EXAM: PORTABLE CHEST 1 VIEW COMPARISON:  02/16/2020 FINDINGS: Right internal jugular central venous catheter tip noted within the right atrium. Lung volumes are  small, but are symmetric and are stable since prior examination. Superimposed perihilar and lower lung zone pulmonary infiltrate appears slightly progressive since prior examination. This may represent a component of superimposed pulmonary edema. No pneumothorax. Tiny left pleural effusion suspected. 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 Mild cardiomegaly is stable. 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 IMPRESSION: Progressive pulmonary infiltrates, possibly representing a component of superimposed cardiogenic failure. Probable tiny left pleural effusion. Stable pulmonary hypoinflation. Electronically Signed   By: Fidela Salisbury MD   On: 02/17/2020 06:00   DG CHEST PORT 1 VIEW  Result Date: 02/16/2020 CLINICAL DATA:  Central venous catheter placement. EXAM: PORTABLE CHEST 1 VIEW COMPARISON:  Chest radiograph 02/16/2020. FINDINGS: Right IJ central venous catheter tip projects over the superior vena cava. Pacer apparatus  overlies the left hemithorax. Stable cardiomegaly. Aortic atherosclerosis. Similar-appearing diffuse bilateral airspace opacities. Possible small left pleural effusion. No pneumothorax. Aortic valve replacement. IMPRESSION: Right IJ central venous catheter tip projects over the superior vena cava. Similar-appearing diffuse bilateral airspace opacities. Electronically Signed   By: Lovey Newcomer M.D.   On: 02/16/2020 07:55   DG Chest Portable 1 View  Result Date: 02/16/2020 CLINICAL DATA:  Central venous catheter placed EXAM: PORTABLE CHEST 1 VIEW COMPARISON:  01/30/2020 FINDINGS: Right internal jugular central venous catheter is in place with its tip within the superior right atrium. No pneumothorax. Pulmonary insufflation has diminished resulting in vascular crowding at the hila. No pneumothorax. Possible tiny left pleural effusion. Coronary artery bypass grafting has been performed. Transcatheter aortic valve noted. Mild cardiomegaly is present. Left subclavian single lead pacemaker is seen with its lead overlying the expected right ventricle. IMPRESSION: Right internal jugular central venous catheter tip within the superior right atrium. No pneumothorax. Electronically Signed   By: Fidela Salisbury MD   On: 02/16/2020 05:07    '@[email or web address]' @   Assessment and Plan:   Acute hypoxic respiratory distress the setting of [id number] multiple complicated issues -On 7/85 respiratory status worsened and patient was admitted to ICU -Supplemental O2/BiPAP as needed -IV steroids and remdesivir - overall prognosis is poor>>palliative care consulted  Symptomatic anemia Myelodysplastic syndrome -Presented with weakness found to have hemoglobin 5.7, which is down from baseline of 8.  Plt count down to 46. Wife reported possible bloody BMs. - Eliquis held - s/p 3 units PRBCs and platelet transfusion - Hgb 8.4 today -Follow CBC -consider Oncology consult  Septic shock Acute metabolic encephalopathy  likely 2/2 to COVID -Lactic acid noted to be elevated to 6.6.  Patient also febrile, tachycardic, tachypneic. - Head CT unremarkable - ammonia up to 38. Trend with history of cirrhosis - Not on pressors - IV abx - BC showed enterococcus faecalis and ID consulted - TTE ordered. Patient will eventually need TEE  AKI -1.7 on admission. 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% -BNP 2000 on admission -As outpatient patient had been volume up at recent visits and Lasix had been increased - CVP monitoring - lasix held with AKI - cautious with IVF - strict I/Os, daily weights  Elevated troponin with history of known CAD s/p remote CABG -Most recent cath  03/23/2017 which showed severe three-vessel CAD with patent LIMA to LAD, continue patency of free radial graft to OM with tight stenosis at the distal anastomosis involving a small caliber vessel, patent SVG to PDA, severe AS - No chest pain reported however diffcult to obtain full history - suspecte  demand ischemia in the setting of multiple complicated issues - Echo ordered - No plan for further ischemic eval - No IV heparin with anemia  Permanent atrial fibrillation -Was previously taken off Eliquis due to severe anemia, very recently restarted last month due to initiation of Revlimid treatment for MDS - Eliquis held in the setting of severe anemia - follow H&H.   Hypertension - home meds initially held for hypotension. 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

## 2020-02-17 NOTE — Telephone Encounter (Signed)
On 1/24-I tried to reach the patient's wife regarding a patient recent admission to hospital.  Unable to leave a voicemail.

## 2020-02-17 NOTE — Progress Notes (Signed)
Nutrition Follow Up Note   DOCUMENTATION CODES:   Obesity unspecified  INTERVENTION:   Recommend placement of small bore nasogastric feeding tube and nutrition support  If nasogastric tube placed, recommend:  Osmolite 1.5 '@70ml' /hr- Initiate at 74m/hr and increase by 187mhr q 8 hours until goal rate is reached.   Pro-Source 4560mID via tube, provides 40kcal and 11g of protein per serving   Free water flushes 25m46m hours to maintain tube patency   Regimen provides 2600kcal/day, 127g/day protein and 1460ml66m free water.   Pt at high refeed risk; recommend monitor potassium, magnesium and phosphorus labs daily until stable  NUTRITION DIAGNOSIS:   Increased nutrient needs related to catabolic illness (COVID 19, CHF, Myelodysplastic syndrome, cirrhosis) as evidenced by estimated needs.  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 fib on Eliquis, type 2 diabetes, HTN, CKD 3a, cirrhosis and gout, recently hospitalized from 1/2-1/11 with respiratory failure secondary to systolic heart failure complicated by acute gout flare in right knee requiring arthrocentesis who presents to the emergency room with poor oral intake, confusion and protracted weakness and was found to have COVID 19   Pt NPO and unable to be placed on a diet per SLP. Recommend nasogastric tube placement and tube feeds to help pt meet his estimated needs; this was discussed with MD in rounds today. Pt is at high refeed risk.   Medications reviewed and include: allopurinol, colace, insulin, solu-medrol, MVI, protonix, omnipen, ceftriaxone  Labs reviewed: Na 158(H), K 2.9(L), BUN 74(H), creat 1.81(H) Hgb  8.4(L), Hct 26.6(L) cbgs- 137, 103, 116, 145, 150 x 24hrs  Diet Order:   Diet Order            Diet NPO time specified  Diet effective now                EDUCATION NEEDS:   No education needs have been identified at this time  Skin:  Skin Assessment: Reviewed RN Assessment (ecchymosis, Stage I sacrum)  Last BM:  PTA  Height:   Ht Readings from Last 1 Encounters:  02/17/2020 6' (1.829 m)    Weight:   Wt Readings from Last 1 Encounters:  02/18/2020 104 kg    Ideal Body Weight:  80.9 kg  BMI:  Body mass index is 31.1 kg/m.  Estimated Nutritional Needs:   Kcal:  2300-2600kcal/day  Protein:  115-130g/day  Fluid:  2.0L/day  CaseyKoleen DistanceRD, LDN Please refer to [facility] and/or RD on-call/weekend/after hours pager

## 2020-02-17 NOTE — Telephone Encounter (Signed)
Pt's wife called to cancel tomorrow's appts. Patient is currently admitted (ICU) and is COVID +. Wife states she will call to r/s once pt has been discharged and fully recovered.  

## 2020-02-17 NOTE — Progress Notes (Signed)
SLP Cancellation Note  Patient Details Name: Evan [name] MRN: [id number] DOB: [birth year]   Cancelled treatment:       Reason Eval/Treat Not Completed: Patient not medically ready (chart reviewed; consulted NSG). Per review of chart and consulted NSG re: his status today, pt is improving slowly it appears. He is able to verbalize somewhat today; still does not follow commands w/out much cueing. He is more coherent today but remains Confused re: self.  Recommend continued NPO status for another day while still improving(septic shock) w/ frequent oral care for hygiene and stimulation of swallowing. ST services will f/u tomorrow. NSG updated.      Orinda Kenner, MS, CCC-SLP Speech Language Pathologist Rehab Services [phone] [facility] 02/17/2020, 1:57 PM

## 2020-02-18 ENCOUNTER — Inpatient Hospital Stay: Payer: Medicare Other

## 2020-02-18 ENCOUNTER — Inpatient Hospital Stay: Payer: Medicare Other | Admitting: Internal Medicine

## 2020-02-18 ENCOUNTER — Encounter: Payer: Self-pay | Admitting: Internal Medicine

## 2020-02-18 DIAGNOSIS — R4182 Altered mental status, unspecified: Secondary | ICD-10-CM | POA: Diagnosis not present

## 2020-02-18 DIAGNOSIS — K7460 Unspecified cirrhosis of liver: Secondary | ICD-10-CM

## 2020-02-18 DIAGNOSIS — D649 Anemia, unspecified: Secondary | ICD-10-CM

## 2020-02-18 DIAGNOSIS — Z7189 Other specified counseling: Secondary | ICD-10-CM | POA: Diagnosis not present

## 2020-02-18 DIAGNOSIS — D469 Myelodysplastic syndrome, unspecified: Secondary | ICD-10-CM

## 2020-02-18 DIAGNOSIS — A419 Sepsis, unspecified organism: Secondary | ICD-10-CM | POA: Diagnosis not present

## 2020-02-18 DIAGNOSIS — G9341 Metabolic encephalopathy: Secondary | ICD-10-CM | POA: Diagnosis not present

## 2020-02-18 DIAGNOSIS — R7881 Bacteremia: Secondary | ICD-10-CM | POA: Diagnosis not present

## 2020-02-18 DIAGNOSIS — Z515 Encounter for palliative care: Secondary | ICD-10-CM | POA: Diagnosis not present

## 2020-02-18 DIAGNOSIS — U071 COVID-19: Secondary | ICD-10-CM | POA: Diagnosis not present

## 2020-02-18 DIAGNOSIS — I4891 Unspecified atrial fibrillation: Secondary | ICD-10-CM

## 2020-02-18 LAB — CULTURE, BLOOD (ROUTINE X 2): Special Requests: ADEQUATE

## 2020-02-18 LAB — BASIC METABOLIC PANEL
Anion gap: 14 (ref 5–15)
BUN: 76 mg/dL — ABNORMAL HIGH (ref 8–23)
CO2: 32 mmol/L (ref 22–32)
Calcium: 8.3 mg/dL — ABNORMAL LOW (ref 8.9–10.3)
Chloride: 112 mmol/L — ABNORMAL HIGH (ref 98–111)
Creatinine, Ser: 1.53 mg/dL — ABNORMAL HIGH (ref 0.61–1.24)
GFR, Estimated: 46 mL/min — ABNORMAL LOW (ref >60)
Glucose, Bld: 145 mg/dL — ABNORMAL HIGH (ref 70–99)
Potassium: 3.1 mmol/L — ABNORMAL LOW (ref 3.5–5.1)
Sodium: 158 mmol/L — ABNORMAL HIGH (ref 135–145)

## 2020-02-18 LAB — GLUCOSE, CAPILLARY
Glucose-Capillary: 108 mg/dL — ABNORMAL HIGH (ref 70–99)
Glucose-Capillary: 133 mg/dL — ABNORMAL HIGH (ref 70–99)
Glucose-Capillary: 134 mg/dL — ABNORMAL HIGH (ref 70–99)
Glucose-Capillary: 146 mg/dL — ABNORMAL HIGH (ref 70–99)
Glucose-Capillary: 164 mg/dL — ABNORMAL HIGH (ref 70–99)
Glucose-Capillary: 181 mg/dL — ABNORMAL HIGH (ref 70–99)
Glucose-Capillary: 229 mg/dL — ABNORMAL HIGH (ref 70–99)

## 2020-02-18 LAB — CBC
HCT: 25.9 % — ABNORMAL LOW (ref 39.0–52.0)
Hemoglobin: 7.9 g/dL — ABNORMAL LOW (ref 13.0–17.0)
MCH: 29.4 pg (ref 26.0–34.0)
MCHC: 30.5 g/dL (ref 30.0–36.0)
MCV: 96.3 fL (ref 80.0–100.0)
Platelets: 31 10*3/uL — ABNORMAL LOW (ref 150–400)
RBC: 2.69 MIL/uL — ABNORMAL LOW (ref 4.22–5.81)
RDW: 25.6 % — ABNORMAL HIGH (ref 11.5–15.5)
WBC: 4.4 10*3/uL (ref 4.0–10.5)
nRBC: 3.9 % — ABNORMAL HIGH (ref 0.0–0.2)

## 2020-02-18 LAB — URINE CULTURE: Culture: NO GROWTH

## 2020-02-18 LAB — MAGNESIUM: Magnesium: 2.9 mg/dL — ABNORMAL HIGH (ref 1.7–2.4)

## 2020-02-18 LAB — FERRITIN: Ferritin: 2017 ng/mL — ABNORMAL HIGH (ref 24–336)

## 2020-02-18 LAB — C-REACTIVE PROTEIN: CRP: 13.6 mg/dL — ABNORMAL HIGH (ref <1.0)

## 2020-02-18 LAB — PROCALCITONIN: Procalcitonin: 0.72 ng/mL

## 2020-02-18 LAB — PHOSPHORUS: Phosphorus: 3.9 mg/dL (ref 2.5–4.6)

## 2020-02-18 LAB — FIBRIN DERIVATIVES D-DIMER (ARMC ONLY): Fibrin derivatives D-dimer (ARMC): 524.37 ng/mL (FEU) — ABNORMAL HIGH (ref 0.00–499.00)

## 2020-02-18 MED ORDER — POTASSIUM CL IN DEXTROSE 5% 20 MEQ/L IV SOLN
20.0000 meq | INTRAVENOUS | Status: DC
  Administered 2020-02-18 – 2020-02-21 (×7): 20 meq via INTRAVENOUS
  Filled 2020-02-18 (×10): qty 1000

## 2020-02-18 NOTE — Progress Notes (Signed)
Patient ID: Evan [name], male   DOB: 07-13-1940, 80 y.o.   MRN: [id number] [facility] PROGRESS NOTE  Evan [name]:[id number] DOB: 1940-06-28 DOA: 2020-03-07 PCP: Mick Sell, MD  HPI/Subjective: Patient wanted his gloves off.  He feels okay.  Does not complain of shortness of breath or cough.  States he is not hungry.  No abdominal pain.  Admitted with acute hypoxic respiratory failure and COVID-19 pneumonia.  Found also have sepsis with Enterococcus.  Objective: Vitals:   02/18/20 1300 02/18/20 1400  BP: 134/71 132/65  Pulse: 78 74  Resp: (!) 21 20  Temp:    SpO2: 92% 98%    Intake/Output Summary (Last 24 hours) at 02/18/2020 1457 Last data filed at 02/18/2020 0600 Gross per 24 hour  Intake 1158.15 ml  Output 1050 ml  Net 108.15 ml   Filed Weights   2020-03-07 1820  Weight: 104 kg    ROS: Review of Systems  Respiratory: Negative for shortness of breath.   Cardiovascular: Negative for chest pain.  Gastrointestinal: Negative for abdominal pain.   Exam: Physical Exam HENT:     Head: Normocephalic.     Mouth/Throat:     Pharynx: No oropharyngeal exudate.  Eyes:     General: Lids are normal.     Conjunctiva/sclera: Conjunctivae normal.  Cardiovascular:     Rate and Rhythm: Normal rate and regular rhythm.     Heart sounds: Normal heart sounds, S1 normal and S2 normal.  Pulmonary:     Breath sounds: Examination of the right-lower field reveals decreased breath sounds and rhonchi. Examination of the left-lower field reveals decreased breath sounds and rhonchi. Decreased breath sounds and rhonchi present. No wheezing or rales.  Abdominal:     Palpations: Abdomen is soft.     Tenderness: There is no abdominal tenderness.  Musculoskeletal:     Right lower leg: No swelling.     Left lower leg: No swelling.  Skin:    General: Skin is warm.     Findings: No rash.  Neurological:     Mental Status: He is lethargic.       Data Reviewed: Basic  Metabolic Panel: Recent Labs  Lab 03/07/2020 1907 02/16/20 0749 02/16/20 1525 02/17/20 0715 02/18/20 0700  NA 153* 152* 152* 158* 158*  K 3.6 3.7 3.4* 2.9* 3.1*  CL 101 103 105 109 112*  CO2 34* 32 33* 34* 32  GLUCOSE 213* 233* 296* 127* 145*  BUN 69* 75* 79* 74* 76*  CREATININE 1.70* 1.92* 2.05* 1.81* 1.53*  CALCIUM 8.7* 8.0* 8.1* 8.3* 8.3*  MG 2.6*  --   --  2.7* 2.9*  PHOS  --   --   --   --  3.9   Liver Function Tests: Recent Labs  Lab 03/07/20 1907 02/16/20 1525 02/17/20 0715  AST 32 30 35  ALT 25 21 22   ALKPHOS 107 84 82  BILITOT 4.2* 3.9* 3.8*  PROT 6.5 5.4* 5.8*  ALBUMIN 2.8* 2.3* 2.3*    Recent Labs  Lab 2020-03-07 1907 02/16/20 0749 02/17/20 0715  AMMONIA 38* 33 29   CBC: Recent Labs  Lab March 07, 2020 1907 02/16/20 0601 02/16/20 1525 02/17/20 0715 02/18/20 0700  WBC 3.2* 3.5* 3.8* 4.9 4.4  NEUTROABS 2.6  --  3.3  --   --   HGB 5.7* 6.8* 7.5* 8.4* 7.9*  HCT 18.9* 22.0* 23.4* 26.6* 25.9*  MCV 106.8* 97.8 93.2 92.7 96.3  PLT 46* 41* 35* 34* 31*   BNP (  last 3 results) Recent Labs    01/26/20 1300 February 22, 2020 1907  BNP 2,541.1* 1,999.6*    ProBNP (last 3 results) Recent Labs    12/06/19 1633  PROBNP 9,423*    CBG: Recent Labs  Lab 02/17/20 1635 02/17/20 1938 02/18/20 0001 02/18/20 0426 02/18/20 0737  GLUCAP 150* 162* 164* 134* 133*    Recent Results (from the past 240 hour(s))  SARS Coronavirus 2 by RT PCR (hospital order, performed in [facility] hospital lab) Nasopharyngeal Nasopharyngeal Swab     Status: Abnormal   Collection Time: February 22, 2020  7:07 PM   Specimen: Nasopharyngeal Swab  Result Value Ref Range Status   SARS Coronavirus 2 POSITIVE (A) NEGATIVE Final    Comment: RESULT CALLED TO, READ BACK BY AND VERIFIED WITH: CHRISTINE HALLAS AT 2050 02/22/20. MF (NOTE) SARS-CoV-2 target nucleic acids are DETECTED  SARS-CoV-2 RNA is generally detectable in upper respiratory specimens  during the acute phase of infection.  Positive  results are indicative  of the presence of the identified virus, but do not rule out bacterial infection or co-infection with other pathogens not detected by the test.  Clinical correlation with patient history and  other diagnostic information is necessary to determine patient infection status.  The expected result is negative.  Fact Sheet for Patients:   BoilerBrush.com.cy   Fact Sheet for Healthcare Providers:   https://pope.com/    This test is not yet approved or cleared by the [place] FDA and  has been authorized for detection and/or diagnosis of SARS-CoV-2 by FDA under an Emergency Use Authorization (EUA).  This EUA will remain in effect (meaning this  test can be used) for the duration of  the COVID-19 declaration under Section 564(b)(1) of the Act, 21 U.S.C. section 360-bbb-3(b)(1), unless the authorization is terminated or revoked sooner.  Performed at [facility], [address] [place], [place] [address]   CULTURE, BLOOD (ROUTINE X 2) w Reflex to ID Panel     Status: Abnormal (Preliminary result)   Collection Time: 02/16/20  2:11 AM   Specimen: BLOOD  Result Value Ref Range Status   Specimen Description   Final    BLOOD LEFT ANTECUBITAL Performed at [facility], [address] [place], [place] [address]    Special Requests   Final    BOTTLES DRAWN AEROBIC AND ANAEROBIC Blood Culture results may not be optimal due to an excessive volume of blood received in culture bottles Performed at [facility], [address] [place], [place] [address]    Culture  Setup Time   Final    IN BOTH AEROBIC AND ANAEROBIC BOTTLES GRAM POSITIVE COCCI CRITICAL VALUE NOTED.  VALUE IS CONSISTENT WITH PREVIOUSLY REPORTED AND CALLED VALUE. Performed at [facility], [address], [place], [place] [address]    Culture (A)  Final    ENTEROCOCCUS FAECALIS SUSCEPTIBILITIES PERFORMED ON  PREVIOUS CULTURE WITHIN THE LAST 5 DAYS. CULTURE REINCUBATED FOR BETTER GROWTH Performed at [facility], [address], [place], [place] [address]    Report Status PENDING  Incomplete  CULTURE, BLOOD (ROUTINE X 2) w Reflex to ID Panel     Status: Abnormal   Collection Time: 02/16/20  2:11 AM   Specimen: BLOOD  Result Value Ref Range Status   Specimen Description   Final    BLOOD BLOOD RIGHT FOREARM Performed at [facility], [address], [place], [place] [address]    Special Requests   Final  BOTTLES DRAWN AEROBIC AND ANAEROBIC Blood Culture adequate volume Performed at [facility], [address] [place], [place] [address]    Culture  Setup Time   Final    IN BOTH AEROBIC AND ANAEROBIC BOTTLES GRAM POSITIVE COCCI CRITICAL RESULT CALLED TO, READ BACK BY AND VERIFIED WITH: BRANDON BEERS 02/16/20 AT 1229 BY ACR Performed at [facility], [address], [place], [place] [address]    Culture ENTEROCOCCUS FAECALIS (A)  Final   Report Status 02/18/2020 FINAL  Final   Organism ID, Bacteria ENTEROCOCCUS FAECALIS  Final      Susceptibility   Enterococcus faecalis - MIC*    AMPICILLIN <=2 SENSITIVE Sensitive     VANCOMYCIN 2 SENSITIVE Sensitive     GENTAMICIN SYNERGY SENSITIVE Sensitive     * ENTEROCOCCUS FAECALIS  Blood Culture ID Panel (Reflexed)     Status: Abnormal   Collection Time: 02/16/20  2:11 AM  Result Value Ref Range Status   Enterococcus faecalis DETECTED (A) NOT DETECTED Final    Comment: CRITICAL RESULT CALLED TO, READ BACK BY AND VERIFIED WITH: BRANDON BEERS 02/16/20 AT 1229 BY ACR    Enterococcus Faecium NOT DETECTED NOT DETECTED Final   Listeria monocytogenes NOT DETECTED NOT DETECTED Final   Staphylococcus species NOT DETECTED NOT DETECTED Final   Staphylococcus aureus (BCID) NOT DETECTED NOT DETECTED Final   Staphylococcus epidermidis NOT DETECTED NOT DETECTED Final   Staphylococcus lugdunensis NOT DETECTED NOT DETECTED  Final   Streptococcus species NOT DETECTED NOT DETECTED Final   Streptococcus agalactiae NOT DETECTED NOT DETECTED Final   Streptococcus pneumoniae NOT DETECTED NOT DETECTED Final   Streptococcus pyogenes NOT DETECTED NOT DETECTED Final   A.calcoaceticus-baumannii NOT DETECTED NOT DETECTED Final   Bacteroides fragilis NOT DETECTED NOT DETECTED Final   Enterobacterales NOT DETECTED NOT DETECTED Final   Enterobacter cloacae complex NOT DETECTED NOT DETECTED Final   Escherichia coli NOT DETECTED NOT DETECTED Final   Klebsiella aerogenes NOT DETECTED NOT DETECTED Final   Klebsiella oxytoca NOT DETECTED NOT DETECTED Final   Klebsiella pneumoniae NOT DETECTED NOT DETECTED Final   Proteus species NOT DETECTED NOT DETECTED Final   Salmonella species NOT DETECTED NOT DETECTED Final   Serratia marcescens NOT DETECTED NOT DETECTED Final   Haemophilus influenzae NOT DETECTED NOT DETECTED Final   Neisseria meningitidis NOT DETECTED NOT DETECTED Final   Pseudomonas aeruginosa NOT DETECTED NOT DETECTED Final   Stenotrophomonas maltophilia NOT DETECTED NOT DETECTED Final   Candida albicans NOT DETECTED NOT DETECTED Final   Candida auris NOT DETECTED NOT DETECTED Final   Candida glabrata NOT DETECTED NOT DETECTED Final   Candida krusei NOT DETECTED NOT DETECTED Final   Candida parapsilosis NOT DETECTED NOT DETECTED Final   Candida tropicalis NOT DETECTED NOT DETECTED Final   Cryptococcus neoformans/gattii NOT DETECTED NOT DETECTED Final   Vancomycin resistance NOT DETECTED NOT DETECTED Final    Comment: Performed at [facility], [address] [place], [place] [address]  CULTURE, BLOOD (ROUTINE X 2) w Reflex to ID Panel     Status: None (Preliminary result)   Collection Time: 02/17/20  2:16 AM   Specimen: BLOOD  Result Value Ref Range Status   Specimen Description BLOOD BLOOD LEFT HAND  Final   Special Requests   Final    BOTTLES DRAWN AEROBIC AND ANAEROBIC Blood Culture adequate volume    Culture   Final    NO GROWTH 1 DAY Performed at [facility], [address] [place], [place] [address]    Report Status PENDING  Incomplete  CULTURE, BLOOD (ROUTINE X 2) w Reflex to ID Panel     Status: None (Preliminary result)   Collection Time: 02/17/20  2:16 AM   Specimen: BLOOD  Result Value Ref Range Status   Specimen Description BLOOD BLOOD RIGHT HAND  Final   Special Requests   Final    BOTTLES DRAWN AEROBIC AND ANAEROBIC Blood Culture adequate volume   Culture   Final    NO GROWTH 1 DAY Performed at [facility], [address], [place], [place] [address]    Report Status PENDING  Incomplete  Urine Culture     Status: None   Collection Time: 02/17/20  7:15 AM   Specimen: Urine, Random  Result Value Ref Range Status   Specimen Description   Final    URINE, RANDOM Performed at [facility], [address], [place], [place] [address]    Special Requests   Final    NONE Performed at [facility], [address] [place], [place] [address]    Culture   Final    NO GROWTH Performed at [facility], [place] [address] [place], [place] [address]    Report Status 02/18/2020 FINAL  Final  MRSA PCR Screening     Status: None   Collection Time: 02/17/20  7:15 AM  Result Value Ref Range Status   MRSA by PCR NEGATIVE NEGATIVE Final    Comment:        The GeneXpert MRSA Assay (FDA approved for NASAL specimens only), is one component of a comprehensive MRSA colonization surveillance program. It is not intended to diagnose MRSA infection nor to guide or monitor treatment for MRSA infections. Performed at [facility], [address] [place], [place] [address]      Studies: DG Chest [place] 1 View  Result Date: 02/17/2020 CLINICAL DATA:  Acute respiratory failure EXAM: PORTABLE CHEST 1 VIEW COMPARISON:  02/16/2020 FINDINGS: Right internal jugular central venous catheter tip noted within the right atrium. Lung  volumes are small, but are symmetric and are stable since prior examination. Superimposed perihilar and lower lung zone pulmonary infiltrate appears slightly progressive since prior examination. This may represent a component of superimposed pulmonary edema. No pneumothorax. Tiny left pleural effusion suspected. 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 Mild cardiomegaly is stable. 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 IMPRESSION: Progressive pulmonary infiltrates, possibly representing a component of superimposed cardiogenic failure. Probable tiny left pleural effusion. Stable pulmonary hypoinflation. Electronically Signed   By: Fidela Salisbury MD   On: 02/17/2020 06:00   ECHOCARDIOGRAM COMPLETE  Result Date: 02/17/2020    ECHOCARDIOGRAM REPORT   Patient Name:   JAHFARI JOYNER [facility] Date of Exam: 02/17/2020 Medical Rec #:  [id number]      Height:       72.0 in Accession #:    [id number]     Weight:       229.3 lb Date of Birth:  1940-08-09      BSA:          2.258 m Patient Age:    42 years       BP:           149/90 mmHg Patient Gender: M              HR:           96 bpm. Exam Location:  [facility] Procedure: 2D Echo, Color Doppler and Cardiac Doppler Indications:     R78.81 Bacteremia  History:         Patient has prior history of Echocardiogram examinations, most                  recent 12/08/2019. CAD, Pacemaker, Stroke; Risk                  Factors:Diabetes, Hypertension and Dyslipidemia.  Sonographer:     Charmayne Sheer RDCS (AE) Referring Phys:  :[id number] Rosine Door Diagnosing Phys: Neoma Laming MD  Sonographer Comments: Suboptimal apical window. IMPRESSIONS  1. Left ventricular ejection fraction, by estimation, is 35 to 40%. The left ventricle has severely decreased function. The left ventricle demonstrates global hypokinesis. The left ventricular internal cavity size was severely dilated. There is mild concentric left ventricular hypertrophy. Left  ventricular diastolic parameters are consistent with Grade III diastolic dysfunction (restrictive).  2. Right ventricular systolic function is severely reduced. The right ventricular size is severely enlarged.  3. Left atrial size was severely dilated.  4. Right atrial size was severely dilated.  5. The mitral valve is degenerative. Mild mitral valve regurgitation. No evidence of mitral stenosis. Severe mitral annular calcification.  6. Tricuspid valve regurgitation is moderate.  7. The aortic valve is calcified. Aortic valve regurgitation is mild. Mild aortic valve sclerosis is present, with no evidence of aortic valve stenosis.  8. The inferior vena cava is normal in size with greater than 50% respiratory variability, suggesting right atrial pressure of 3 mmHg. FINDINGS  Left Ventricle: Left ventricular ejection fraction, by estimation, is 35 to 40%. The left ventricle has severely decreased function. The left ventricle demonstrates global hypokinesis. The left ventricular internal cavity size was severely dilated. There is mild concentric left ventricular hypertrophy. Left ventricular diastolic parameters are consistent with Grade III diastolic dysfunction (restrictive). Right Ventricle: The right ventricular size is severely enlarged. No increase in right ventricular wall thickness. Right ventricular systolic function is severely reduced. Left Atrium: Left atrial size was severely dilated. Right Atrium: Right atrial size was severely dilated. Pericardium: There is no evidence of pericardial effusion. Mitral Valve: The mitral valve is degenerative in appearance. Severe mitral annular calcification. Mild mitral valve regurgitation. No evidence of mitral valve stenosis. MV peak gradient, 6.5 mmHg. The mean mitral valve gradient is 2.0 mmHg. Tricuspid Valve: The tricuspid valve is normal in structure. Tricuspid valve regurgitation is moderate . No evidence of tricuspid stenosis. Aortic Valve: The aortic valve is  calcified. Aortic valve regurgitation is mild. Mild aortic valve sclerosis is present, with no evidence of aortic valve stenosis. Aortic valve mean gradient measures 9.0 mmHg. Aortic valve peak gradient measures 20.6 mmHg. Aortic valve area, by VTI measures 1.60 cm. Pulmonic Valve: The pulmonic valve was normal in structure. Pulmonic valve regurgitation is not visualized. No evidence of pulmonic stenosis. Aorta: The aortic root is normal in size and structure. Venous: The inferior vena cava is normal in size with greater than 50% respiratory variability, suggesting right atrial pressure of 3 mmHg. IAS/Shunts: The interatrial septum was not assessed.  LEFT VENTRICLE PLAX 2D LVIDd:         5.87 cm LVIDs:         4.74 cm LV PW:         1.28 cm LV IVS:        1.19 cm LVOT diam:     2.40 cm LV SV:  52 LV SV Index:   23 LVOT Area:     4.52 cm  LEFT ATRIUM            Index LA diam:      5.10 cm  2.26 cm/m LA Vol (A4C): 117.0 ml 51.82 ml/m  AORTIC VALVE                    PULMONIC VALVE AV Area (Vmax):    1.72 cm     PV Vmax:       1.45 m/s AV Area (Vmean):   1.79 cm     PV Vmean:      91.800 cm/s AV Area (VTI):     1.60 cm     PV VTI:        0.181 m AV Vmax:           227.00 cm/s  PV Peak grad:  8.4 mmHg AV Vmean:          134.000 cm/s PV Mean grad:  4.0 mmHg AV VTI:            0.322 m AV Peak Grad:      20.6 mmHg AV Mean Grad:      9.0 mmHg LVOT Vmax:         86.30 cm/s LVOT Vmean:        52.900 cm/s LVOT VTI:          0.114 m LVOT/AV VTI ratio: 0.35  AORTA Ao Root diam: 2.70 cm MITRAL VALVE                TRICUSPID VALVE MV Area (PHT): 3.71 cm     TR Peak grad:   55.1 mmHg MV Area VTI:   1.51 cm     TR Vmax:        371.00 cm/s MV Peak grad:  6.5 mmHg MV Mean grad:  2.0 mmHg     SHUNTS MV Vmax:       1.27 m/s     Systemic VTI:  0.11 m MV Vmean:      70.1 cm/s    Systemic Diam: 2.40 cm MV Decel Time: 205 msec MV E velocity: 117.50 cm/s Neoma Laming MD Electronically signed by Neoma Laming MD Signature  Date/Time: 02/17/2020/2:51:43 PM    Final     Scheduled Meds: . sodium chloride   Intravenous Once  . sodium chloride   Intravenous Once  . allopurinol  100 mg Oral Daily  . Chlorhexidine Gluconate Cloth  6 each Topical Daily  . docusate sodium  100 mg Oral Daily  . feeding supplement (NEPRO CARB STEADY)  237 mL Oral TID BM  . insulin aspart  0-20 Units Subcutaneous Q4H  . methylPREDNISolone (SOLU-MEDROL) injection  40 mg Intravenous Q12H  . multivitamin with minerals  1 tablet Oral Daily  . pantoprazole (PROTONIX) IV  40 mg Intravenous Q24H  . tamsulosin  0.4 mg Oral Daily   Continuous Infusions: . sodium chloride    . sodium chloride    . ampicillin (OMNIPEN) IV 2 g (02/18/20 0909)  . cefTRIAXone (ROCEPHIN)  IV 2 g (02/18/20 0911)  . dextrose 5 % with KCl 20 mEq / L    . remdesivir 100 mg in NS 100 mL 100 mg (02/18/20 0916)   Brief history admitted 02/15/2019 with septic shock with Enterococcus and COVID-19 pneumonia.  Patient has multiorgan failure including acute hypoxic respiratory failure acute kidney injury, acute metabolic encephalopathy.  The patient has  a history of myelodysplastic syndrome and came in with an initial hemoglobin of 5.7.  Was given 3 units of packed red blood cells and hemoglobin came up to 7.9.  Patient on Solu-Medrol and remdesivir.  On heated high flow nasal cannula.  Specialists following our critical care specialist, oncology, cardiology and infectious disease.  Assessment/Plan:  1. Septic shock, present on admission with a lactic acid up at 6.6.  Patient also had fever tachypnea, leukopenia and tachycardia.  Patient initially had hypotension, acute kidney injury, acute hypoxic respiratory failure and acute metabolic encephalopathy.  Blood cultures positive for Enterococcus faecalis.  Patient on Rocephin and ampicillin.  Initial TTE negative for endocarditis.  Too sick for TEE at this point. 2. Acute on chronic hypoxic respiratory failure secondary to  COVID-19 pneumonia.  Patient on heated high flow nasal cannula at 80% FiO2 and 55 L.  Continue Solu-Medrol and remdesivir.  With septic shock not a great candidate for baricitinib at this point. 3. Acute metabolic encephalopathy secondary to septic shock and COVID-19 infection..  Patient able to answer few more questions.  Follows some commands but did not do well with the swallow evaluation.  Likely will not be able to keep up with nutritional needs. 4. Acute kidney injury.  Baseline creatinine about 1.16.  Creatinine peaked at 2.05 and is now down to 1.53 with IV fluids. 5. Hyponatremia and hypokalemia.  Case discussed with pharmacist to change IV fluids to D5 W+ 20 KCl. 6. Myelodysplastic syndrome with severe anemia.  Patient initially had a hemoglobin of 5.7 and received 3 units of packed red blood cells.  Current hemoglobin 7.9. 7. Hematochezia.  Holding Eliquis for now 8. Chronic systolic congestive heart failure.  Continue to monitor fluid status closely. 9. Chronic atrial fibrillation with history of Taber and acquired thrombophilia.  Holding Eliquis.  Stroke risk will be higher with holding Eliquis. 10. Type 2 diabetes mellitus on sliding scale 11. Elevated troponin likely demand ischemia 12. Cirrhosis of the liver 13. Stage I sacral decubiti, present on admission.  See full description below 14. Palliative care consultation.  Patient currently full code. 15. Nutrition.  Nursing staff hesitant about placing NG tube at this point with thrombocytopenia  Pressure Injury 02/16/20 Sacrum Mid Stage 1 -  Intact skin with non-blanchable redness of a localized area usually over a bony prominence. (Active)  02/16/20 1000  Location: Sacrum  Location Orientation: Mid  Staging: Stage 1 -  Intact skin with non-blanchable redness of a localized area usually over a bony prominence.  Wound Description (Comments):   Present on Admission: Yes       Code Status:     Code Status Orders  (From  admission, onward)         Start     Ordered   02/06/2020 2149  Full code  Continuous        02/23/2020 2151        Code Status History    Date Active Date Inactive Code Status Order ID Comments User Context   01/26/2020 1619 02/04/2020 2326 Full Code [id number]  Rhetta Mura, DO ED   12/07/2019 1334 12/12/2019 2209 Full Code [id number]  Mitzi Hansen, MD ED   05/09/2017 1240 05/11/2017 1803 Full Code [id number]  Sherren Mocha, MD Inpatient   03/23/2017 1327 03/24/2017 1435 Full Code [id number]  Constance Haw, MD Inpatient   03/23/2017 1323 03/23/2017 1327 Full Code [id number]  Sherren Mocha, MD Inpatient   08/24/2015 2222 08/26/2015 1548 Full Code [id number]  Ivor Costa, MD ED   05/28/2012 1407 05/29/2012 1415 Full Code [id number]  Gabriel Earing, PA-C Inpatient   Advance Care Planning Activity     Family Communication: Spoke with wife on the phone Disposition Plan: Status is: Inpatient  Dispo: The patient is from: Home              Anticipated d/c is to: Likely will need rehab              Anticipated d/c date is: Likely will need another week or so here in the hospital              Patient currently being treated for septic shock and COVID-19 pneumonia with multiorgan failure.  Not ready for disposition   Difficult to place patient.  Likely yes because he will likely need rehab.  Time spent: 28 minutes  [place]

## 2020-02-18 NOTE — Consult Note (Signed)
Consultation Note Date: 02/18/2020   Patient Name: Evan [name]  DOB: 10/01/1940  MRN: [id number]  Age / Sex: 80 y.o., male  PCP: Evan Ramsay, MD Referring Physician: Loletha Grayer, MD  Reason for Consultation: Establishing goals of care and Psychosocial/spiritual support  HPI/Patient Profile: 80 y.o. 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   Clinical Assessment and Goals of Care: Evan [name] is viewed through the glass door of the ICU.  He appears acutely/chronically ill and somewhat frail.  Per nursing staff he is oriented to self only.  There is no family at bedside at this time.  I return later in the afternoon to find Evan [name] at bedside.  She and I meet in the family waiting room.  We talked about Evan [name] acute and chronic health concerns.  She shares that he has had a decline over the last few months in particular.  We talked about his MDS syndrome and oncology treatment.  We talked about his other health concerns in general.  We talked in particular about Covid pneumonia and the treatment, septic shock.  We talked about temporary feeding tube.  Nursing staff share that Evan [name] declined a temporary NG, but realistically, he must have nutrition if he has any hope for a turnaround.  At this point Evan [name] would accept a temporary feeding tube.  We talked about CODE STATUS.  She shares that Evan [name] shared that he would want all measures to keep him alive.  We talked about some "what if's and maybe's".  I encouraged Evan [name] to think about what is next for her husband, and to look for meaningful  improvements.  We talked about 24 to 48 hours for outcomes.  Conference with attending, CCM, bedside nursing staff related to patient condition, needs, goals of care.  Palliative medicine team to follow-up 1/26  HCPOA    NEXT OF KIN -wife of 69 years, Evan [name].  She tells me that they had no children together, but shared 6 children between them    [place]   At this point full scope/full code Wife is considering CODE STATUS 24 to 48 hours for outcomes   Code Status/Advance Care Planning:  Full code  Symptom Management:   Per hospitalist/CCM, no additional needs at this time.  Palliative Prophylaxis:   Frequent Pain Assessment, Oral Care and Turn Reposition  Additional Recommendations (Limitations, Scope, Preferences):  Full Scope Treatment  Psycho-social/Spiritual:   Desire for further Chaplaincy support:no  Additional Recommendations: Caregiving  Support/Resources and Education on Hospice  Prognosis:   Unable to determine, based on outcomes. Guarded.   Discharge Planning: to be determined, based on outcomes.       Primary Diagnoses: Present on Admission: . Essential hypertension . Cirrhosis (  HCC) . Symptomatic anemia . Coronary artery disease . Atrial fibrillation, permanent ([place])   I have reviewed the medical record, interviewed the patient and family, and examined the patient. The following aspects are pertinent.  Past Medical History:  Diagnosis Date  . Anemia   . Carotid arterial disease ([place])   . CHF (congestive heart failure) ([place])   . Coronary artery disease   . Hearing loss   . HOH (hard of hearing)   . Hyperlipidemia   . Hypertension   . Nocturia   . Obesity   . Osteoarthritis    "BUE; BLE; right knee" (03/23/2017)  . Peripheral vision loss, bilateral    S/P 07/2015  . Permanent atrial fibrillation ([place])   . 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 Severe aortic stenosis   . Sinus drainage   .  Stroke [facility]) 07/2015   "peripheral vision still bad out of left eye" (03/23/2017)  . Torn rotator cuff 2012   Right arm  . Type II diabetes mellitus ([place])   . Urinary frequency    Social History   Socioeconomic History  . Marital status: Married    Spouse name: Not on file  . Number of children: 5  . Years of education: Not on file  . Highest education level: Not on file  Occupational History  . Occupation: retired    Fish farm manager: RETIRED  Tobacco Use  . Smoking status: Former Smoker    Packs/day: 2.00    Years: 39.00    Pack years: 78.00    Types: Cigarettes    Quit date: 01/24/1994    Years since quitting: 26.0  . Smokeless tobacco: Never Used  Vaping Use  . Vaping Use: Never used  Substance and Sexual Activity  . Alcohol use: No  . Drug use: No  . Sexual activity: Never  Other Topics Concern  . Not on file  Social History Narrative   On Farm/ in [place]; no alcohol; quit smoking- 96; worked in Estate agent.    Social Determinants of Health   Financial Resource Strain: Not on file  Food Insecurity: Not on file  Transportation Needs: Not on file  Physical Activity: Not on file  Stress: Not on file  Social Connections: Not on file   Family History  Problem Relation Age of Onset  . Heart attack Father   . Heart attack Brother   . Parkinsonism Brother   . Cancer Brother   . Arrhythmia Brother    Scheduled Meds: . sodium chloride   Intravenous Once  . sodium chloride   Intravenous Once  . allopurinol  100 mg Oral Daily  . Chlorhexidine Gluconate Cloth  6 each Topical Daily  . docusate sodium  100 mg Oral Daily  . feeding supplement (NEPRO CARB STEADY)  237 mL Oral TID BM  . insulin aspart  0-20 Units Subcutaneous Q4H  . methylPREDNISolone (SOLU-MEDROL) injection  40 mg Intravenous Q12H  . multivitamin with minerals  1 tablet Oral Daily  . pantoprazole (PROTONIX) IV  40 mg Intravenous Q24H  . tamsulosin  0.4 mg Oral Daily   Continuous  Infusions: . sodium chloride    . sodium chloride    . ampicillin (OMNIPEN) IV 2 g (02/18/20 0909)  . cefTRIAXone (ROCEPHIN)  IV 2 g (02/18/20 0911)  . dextrose 5 % with KCl 20 mEq / L    . remdesivir 100 mg in NS 100 mL 100 mg (02/18/20 0916)   PRN Meds:.acetaminophen **OR** acetaminophen, ondansetron **  OR** ondansetron (ZOFRAN) IV Medications Prior to Admission:  Prior to Admission medications   Medication Sig Start Date End Date Taking? Authorizing Provider  allopurinol (ZYLOPRIM) 100 MG tablet Take 1 tablet (100 mg total) by mouth daily. 02/04/20 02/03/21 Yes Pahwani, Rinka R, MD  acetaminophen (TYLENOL) 650 MG CR tablet Take 1,300 mg by mouth every 8 (eight) hours as needed for pain.     [provider]  amLODipine (NORVASC) 2.5 MG tablet Take 1 tablet (2.5 mg total) by mouth daily. 11/27/19 02/25/20  [name], [name] M, MD  apixaban (ELIQUIS) 5 MG TABS tablet Take 1 tablet (5 mg total) by mouth 2 (two) times daily. 01/09/20 07/07/20  Almyra Deforest, PA  benazepril (LOTENSIN) 40 MG tablet Take 40 mg by mouth daily. Patient not taking: No sig reported 01/20/20   [provider]  cyanocobalamin 1000 MCG tablet Take 1,000 mcg by mouth daily.     [provider]  docusate sodium (COLACE) 100 MG capsule Take 1 capsule (100 mg total) by mouth daily. Patient not taking: Reported on 02/16/2020 02/05/20   Pahwani, Michell Heinrich, MD  ezetimibe (ZETIA) 10 MG tablet TAKE 1 TABLET BY MOUTH EVERY DAY Patient taking differently: Take 10 mg by mouth daily. 01/08/19   [name], [name] M, MD  furosemide (LASIX) 80 MG tablet Take 80 mg by mouth 2 (two) times daily.    [provider]  glimepiride (AMARYL) 2 MG tablet TAKE 1 TABLET (2 MG TOTAL) BY MOUTH DAILY WITH BREAKFAST Patient taking differently: Take 2 mg by mouth daily with breakfast. 01/25/18   [name], [name] M, MD  hydrALAZINE (APRESOLINE) 25 MG tablet Take 1 tablet (25 mg total) by mouth 2 (two) times daily. 06/10/19   [name], [name] M, MD   lenalidomide (REVLIMID) 5 MG capsule Take 1 capsule (5 mg total) by mouth daily. Fanny Dance # [id number]     Date Obtained 01/01/2020 Patient not taking: Reported on 02/16/2020 01/01/20   Cammie Sickle, MD  loratadine (CLARITIN) 10 MG tablet Take 10 mg by mouth daily.    [provider]  pantoprazole (PROTONIX) 40 MG tablet Take 1 tablet (40 mg total) by mouth daily. Patient not taking: Reported on 02/16/2020 01/14/20   [name], [name] M, MD  polyethylene glycol (MIRALAX / GLYCOLAX) 17 g packet Take 17 g by mouth daily. Patient not taking: Reported on 02/16/2020 02/04/20   Mckinley Jewel, MD  Polyvinyl Alcohol-Povidone (REFRESH OP) Place 1 drop into both eyes daily as needed (for dry eyes).     [provider]  potassium chloride SA (KLOR-CON) 20 MEQ tablet Take 1 tablet (20 mEq total) by mouth 2 (two) times daily. 01/08/20   Almyra Deforest, PA  prochlorperazine (COMPAZINE) 10 MG tablet Take 1 tablet (10 mg total) by mouth every 6 (six) hours as needed for nausea or vomiting. 01/06/20   Cammie Sickle, MD  tamsulosin (FLOMAX) 0.4 MG CAPS capsule TAKE 1 CAPSULE BY MOUTH ONCE DAILY Patient taking differently: Take 0.4 mg by mouth daily. 09/21/15   [name], [name] M, MD  traZODone (DESYREL) 50 MG tablet Take 50 mg by mouth at bedtime. Patient not taking: No sig reported 01/17/20   [provider]   Allergies  Allergen Reactions  . Niacin Other (See Comments) and Rash    Red face and burns.  . Niacin And Related Rash and Other (See Comments)    Red face and burns.  . Metformin And Related Diarrhea  . Statins Other (See  Comments)    Myalgias, weakness   Review of Systems  Unable to perform ROS: Acuity of condition    Physical Exam Vitals and nursing note reviewed.  Constitutional:      Appearance: He is ill-appearing.  Cardiovascular:     Rate and Rhythm: Normal rate.  Pulmonary:     Comments: BiPAP in place Neurological:     Comments: Oriented to self  only      Vital Signs: BP (!) 159/76   Pulse 73   Temp (!) 96 F (35.6 C) (Oral)   Resp (!) 23   Ht 6' (1.829 m)   Wt 104 kg   SpO2 92%   BMI 31.10 kg/m  Pain Scale: Faces   Pain Score: 0-No pain   SpO2: SpO2: 92 % O2 Device:SpO2: 92 % O2 Flow Rate: .O2 Flow Rate (L/min): 55 L/min  IO: Intake/output summary:   Intake/Output Summary (Last 24 hours) at 02/18/2020 1317 Last data filed at 02/18/2020 0600 Gross per 24 hour  Intake 1208.15 ml  Output 1050 ml  Net 158.15 ml    LBM: Last BM Date: 01/26/20 Baseline Weight: Weight: 104 kg Most recent weight: Weight: 104 kg     Palliative Assessment/Data:   Flowsheet Rows   Flowsheet Row Most Recent Value  Intake Tab   Referral Department Hospitalist  Unit at Time of Referral Intermediate Care Unit  Palliative Care Primary Diagnosis Pulmonary  Date Notified 02/14/2020  Palliative Care Type Return patient Palliative Care  Reason for referral Clarify Goals of Care  Date of Admission 02/01/2020  Date first seen by Palliative Care 02/18/20  # of days Palliative referral response time 3 Day(s)  # of days IP prior to Palliative referral 0  Clinical Assessment   Palliative Performance Scale Score 20%  Pain Max last 24 hours Not able to report  Pain Min Last 24 hours Not able to report  Dyspnea Max Last 24 Hours Not able to report  Dyspnea Min Last 24 hours Not able to report  Psychosocial & Spiritual Assessment   Palliative Care Outcomes       Time In: 1420 Time Out: 1540 Time Total: 80 minutes  Greater than 50%  of this time was spent counseling and coordinating care related to the above assessment and plan.  Signed by: Drue Novel, NP   Please contact Palliative Medicine Team phone at [phone] for questions and concerns.  For individual provider: See Shea Evans

## 2020-02-18 NOTE — Progress Notes (Signed)
ID pts condition status quo Awake, responds to some questions , but is confused BP (!) 144/66   Pulse 75   Temp (!) 97.2 F (36.2 C) (Oral)   Resp 19   Ht 6' (1.829 m)   Wt 104 kg   SpO2 95%   BMI 31.10 kg/m    Chest b/l air entry crepts HSirregular abd soft Cns -did not examine in detail  Labs CBC Latest Ref Rng & Units 02/18/2020 02/17/2020 02/16/2020  WBC 4.0 - 10.5 K/uL 4.4 4.9 3.8(L)  Hemoglobin 13.0 - 17.0 g/dL 7.9(L) 8.4(L) 7.5(L)  Hematocrit 39.0 - 52.0 % 25.9(L) 26.6(L) 23.4(L)  Platelets 150 - 400 K/uL 31(L) 34(L) 35(L)    CMP Latest Ref Rng & Units 02/18/2020 02/17/2020 02/16/2020  Glucose 70 - 99 mg/dL 145(H) 127(H) 296(H)  BUN 8 - 23 mg/dL 76(H) 74(H) 79(H)  Creatinine 0.61 - 1.24 mg/dL 1.53(H) 1.81(H) 2.05(H)  Sodium 135 - 145 mmol/L 158(H) 158(H) 152(H)  Potassium 3.5 - 5.1 mmol/L 3.1(L) 2.9(L) 3.4(L)  Chloride 98 - 111 mmol/L 112(H) 109 105  CO2 22 - 32 mmol/L 32 34(H) 33(H)  Calcium 8.9 - 10.3 mg/dL 8.3(L) 8.3(L) 8.1(L)  Total Protein 6.5 - 8.1 g/dL - 5.8(L) 5.4(L)  Total Bilirubin 0.3 - 1.2 mg/dL - 3.8(H) 3.9(H)  Alkaline Phos 38 - 126 U/L - 82 84  AST 15 - 41 U/L - 35 30  ALT 0 - 44 U/L - 22 21   Micro 02/16/2020 blood culture Enterococcus faecalis 02/17/2020 blood cultures pending. 02/17/2020 urine culture no growth.  Impression/recommendation Enterococcus bacteremia.  Has a permanent pacemaker.  Also has TAVR 2D echo shows EF of 35 to 40%.  Severely rate decreased left ventricular function.  Left ventricle demonstrated global hypokinesis.  Aortic valve is calcified Mitral valve is degenerative.  Patient ideally needs TEE but he is too ill and also has Covid currently to undergo that procedure. Continue ampicillin and ceftriaxone.  Covid pneumonia with acute hypoxic respiratory failure.  Has received remdesivir and steroids.  Encephalopathy: Combination of bacteremia and Covid illness  A. fib  CHF  Hyperbilirubinemia  CKD  Myelodysplastic  syndrome with anemia, leukopenia and thrombocytopenia Received  3 units of PRBC  Cirrhosis liver  Gout  Patient is critically ill and his prognosis is guarded. Palliative on board.  Discussed the management with the care team.

## 2020-02-18 NOTE — Progress Notes (Signed)
Speech Language Pathology Treatment: Dysphagia  Patient Details Name: Evan [name] MRN: [id number] DOB: 18-Jun-1940 Today's Date: 02/18/2020 Time: 1215-1300 SLP Time Calculation (min) (ACUTE ONLY): 45 min  Assessment / Plan / Recommendation Clinical Impression  Pt seen today for ongoing assessment of swallowing; po trials in hopes to initiate an oral diet. Pt is more awake today though continues to present w/ drowsiness and fatigue w/ any exertion. He remains on HFNC w/ 55% FIO2 support d/t declined Pulmonary status. He has verbalized some w/ staff and Wife but often delayed in his responses.  Pt appears to present w/ oropharyngeal phase dysphagia hampered by Significantly declined Pulmonary status w/ Cognitive decline as well. He appears drowsy w/ delayed responses to Mod verbal/tactile stim but improved somewhat in his ability to respond since initial eval. W/ all bolus trials of ice chips, pt exhibited min improved oral response and awareness w/ lingual manipulation and A-P transfer followed by swallowing. SLP continued w/ trials of Nectar liquids via TSP and 1/2 tsps of Puree. Again, improved oral awareness during the oral prep and oral phase though Mod+ verbal/tactile cues still required to encourage labial closure on spoon to take the bolus. Slow bolus manipulation noted overall w/ suspected premature spillage of Nectar liquids d/t lack of full oral control. Cues given to focus on A-P transfer and swallowing w/ trials as well as cues given to remain alert to the task. Swallows appeared min more complete w/ no immediate, overt clinical s/s of aspiration noted: no decline in O2 sats(96%), RR 19-22. Pt presented w/ a slight mouth open posture at rest and during trials. Oral care given prior to po's w/ min+ debris in oral cavity noted by SLP. No other trial consistencies attempted secondary to pt's presentation and increased risk for aspiration/choking. Pt is hampered by his Mod+ drowsiness and  fatigued presentation w/ the exertion of any task. Rest Breaks given b/t trials to allow for calming of breath support.  Discussed presentation w/ MD and Palliative Care, NSG. Recommend trial of Pleasure po's w/ NSG of Nectar liquids via TSP and Purees(applesauce) w/ strict aspiration precautions and Supervision but otherwise NPO status w/ frequent oral care for hygiene and stimulation of swallowing next 1-2 days. IF pt is able to maintain safety of swallow w/ these Pleasure po's, the a dysphagia diet can by initiated. MD agreed. Wife agreed. NSG updated. ST services will f/u w/ ongoing oral assessment/awareness stimulation for swallowing in hopes to establish and oral diet next few days. Dietician f/u recommended for support.     HPI HPI: Pt is a 80 y.o. male who presents the emergency department via EMS from home for complaint of increased altered mental status, weakness, decreased intake of fluids and solids.  Patient was admitted to the hospital  on 01/26/2020.  Patient was discharged on 01/11.  Patient been admitted for CHF, weakness to the point of being unable to stand or ambulate, and gout.  Patient had been admitted for 9 days.  Per the wife, patient had been weak when his discharge but had been relatively his normal self.  Patient's wife is the primary historian states that over the past week the patient has become increasingly weak, is having decreased intake of solids and liquids.  The patient's wife states that the peripheral edema the patient presented with on the second of this month has had significant decrease.  The wife reports that the patient is now actively losing weight but over the past several days has been too  weak to stand adequately check weights at home.  The wife states that the patient has been taking his prescriptions up until this morning at which time he was unable to take his prescriptions.  Patient has had decreased verbalization to the wife and the family.  The wife states  that the patient is primarily communicating through yes/no questions or pointing at items.  Patient would answer yes no questions for me and denied any headache, chest pain, sensation of shortness of breath, abdominal pain, nausea.  Patient denied any musculoskeletal complaints.  Again this was using yes/no questions.  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 fib, CVA, diabetes, cirrhosis.  CXR: Mild congestive heart failure, without significant change since  previous.  Head CT: No acute intracranial abnormalities.  Pt is poorly responsive at this evaluation.      SLP Plan  Continue with current plan of care       Recommendations  Diet recommendations:  (Pleasure po's w/ NSG - strict precautions) Liquids provided via: Teaspoon Medication Administration: Via alternative means Supervision: Staff to assist with self feeding;Full supervision/cueing for compensatory strategies Compensations: Minimize environmental distractions;Slow rate;Small sips/bites;Lingual sweep for clearance of pocketing;Multiple dry swallows after each bite/sip;Follow solids with liquid Postural Changes and/or Swallow Maneuvers: Seated upright 90 degrees;Upright 30-60 min after meal                General recommendations:  (Palliative Care consult; Dietician f/u) Oral Care Recommendations: Oral care BID;Oral care before and after PO;Staff/trained caregiver to provide oral care Follow up Recommendations: Skilled Nursing facility (TBD) SLP Visit Diagnosis: Dysphagia, oropharyngeal phase (R13.12) Plan: Continue with current plan of care       GO                 Orinda Kenner, [place], [place] [facility] [phone] [facility] 02/18/2020, 4:29 PM

## 2020-02-19 DIAGNOSIS — B952 Enterococcus as the cause of diseases classified elsewhere: Secondary | ICD-10-CM | POA: Diagnosis not present

## 2020-02-19 DIAGNOSIS — D469 Myelodysplastic syndrome, unspecified: Secondary | ICD-10-CM | POA: Diagnosis not present

## 2020-02-19 DIAGNOSIS — I4821 Permanent atrial fibrillation: Secondary | ICD-10-CM | POA: Diagnosis not present

## 2020-02-19 DIAGNOSIS — R7881 Bacteremia: Secondary | ICD-10-CM | POA: Diagnosis not present

## 2020-02-19 DIAGNOSIS — E870 Hyperosmolality and hypernatremia: Secondary | ICD-10-CM

## 2020-02-19 DIAGNOSIS — K7469 Other cirrhosis of liver: Secondary | ICD-10-CM

## 2020-02-19 DIAGNOSIS — U071 COVID-19: Secondary | ICD-10-CM | POA: Diagnosis not present

## 2020-02-19 DIAGNOSIS — I5022 Chronic systolic (congestive) heart failure: Secondary | ICD-10-CM | POA: Diagnosis not present

## 2020-02-19 DIAGNOSIS — Z7189 Other specified counseling: Secondary | ICD-10-CM | POA: Diagnosis not present

## 2020-02-19 DIAGNOSIS — Z515 Encounter for palliative care: Secondary | ICD-10-CM | POA: Diagnosis not present

## 2020-02-19 LAB — BASIC METABOLIC PANEL
Anion gap: 13 (ref 5–15)
BUN: 75 mg/dL — ABNORMAL HIGH (ref 8–23)
CO2: 33 mmol/L — ABNORMAL HIGH (ref 22–32)
Calcium: 8.5 mg/dL — ABNORMAL LOW (ref 8.9–10.3)
Chloride: 112 mmol/L — ABNORMAL HIGH (ref 98–111)
Creatinine, Ser: 1.53 mg/dL — ABNORMAL HIGH (ref 0.61–1.24)
GFR, Estimated: 46 mL/min — ABNORMAL LOW (ref >60)
Glucose, Bld: 166 mg/dL — ABNORMAL HIGH (ref 70–99)
Potassium: 3.1 mmol/L — ABNORMAL LOW (ref 3.5–5.1)
Sodium: 158 mmol/L — ABNORMAL HIGH (ref 135–145)

## 2020-02-19 LAB — GLUCOSE, CAPILLARY
Glucose-Capillary: 139 mg/dL — ABNORMAL HIGH (ref 70–99)
Glucose-Capillary: 150 mg/dL — ABNORMAL HIGH (ref 70–99)
Glucose-Capillary: 164 mg/dL — ABNORMAL HIGH (ref 70–99)
Glucose-Capillary: 173 mg/dL — ABNORMAL HIGH (ref 70–99)
Glucose-Capillary: 155 mg/dL — ABNORMAL HIGH (ref 70–99)
Glucose-Capillary: 164 mg/dL — ABNORMAL HIGH (ref 70–99)

## 2020-02-19 LAB — CBC
HCT: 26.7 % — ABNORMAL LOW (ref 39.0–52.0)
Hemoglobin: 8 g/dL — ABNORMAL LOW (ref 13.0–17.0)
MCH: 29.5 pg (ref 26.0–34.0)
MCHC: 30 g/dL (ref 30.0–36.0)
MCV: 98.5 fL (ref 80.0–100.0)
Platelets: 30 10*3/uL — ABNORMAL LOW (ref 150–400)
RBC: 2.71 MIL/uL — ABNORMAL LOW (ref 4.22–5.81)
RDW: 25.1 % — ABNORMAL HIGH (ref 11.5–15.5)
WBC: 5.5 10*3/uL (ref 4.0–10.5)
nRBC: 4.7 % — ABNORMAL HIGH (ref 0.0–0.2)

## 2020-02-19 LAB — MAGNESIUM: Magnesium: 2.8 mg/dL — ABNORMAL HIGH (ref 1.7–2.4)

## 2020-02-19 LAB — PHOSPHORUS: Phosphorus: 3.6 mg/dL (ref 2.5–4.6)

## 2020-02-19 LAB — FIBRIN DERIVATIVES D-DIMER (ARMC ONLY): Fibrin derivatives D-dimer (ARMC): 539.35 ng/mL (FEU) — ABNORMAL HIGH (ref 0.00–499.00)

## 2020-02-19 LAB — FERRITIN: Ferritin: 1556 ng/mL — ABNORMAL HIGH (ref 24–336)

## 2020-02-19 NOTE — Progress Notes (Signed)
Palliative: Mr. Nazareno is sitting up quietly in bed.  He appears acutely/chronically ill and quite frail.  He does not appear to have respiratory distress, but has high flow nasal cannula at 98%.  He is alert at times, oriented to person and place.  He is unable to tell me the month.  His wife of 36 years, [name] is at bedside.  We talked in detail about Mr. Meno acute health problems including, but not limited to Covid pneumonia, bacteremia, risk for aspiration.  We talked about Mr. Kloepfer high oxygen requirements, but he is maxed out on oxygen treatment.  Mrs. Shatswell states understanding.  We talked about CODE STATUS in detail.  We talked about intubation and life support.  Mr. Meredith Mody states that he would except mechanical ventilation at this point.  I ask, "for how long?".  At this point he states "for 5 years".  Mr. Phillip Heal states that he would like some time in private to talk about this with his wife.   We also talked about tube feeding.  Mr. Eulas Post shares that he would not except a tube to feed him, even a temporary tube in his nose.  I shared that if he accepts intubation/ventilator support then he would receive temporary nutrition via tube.  All questions answered, and I will leave the [place] family to discuss options.  I return a few minutes later to talk with Mrs. Vandevander.   I again, share the medical team's concern about ventilator support for Mr. Scoggins with Covid pneumonia and bacteremia.   Mrs.Gose states that Mr. Mcilhenny wants to try, and she does not want to take that chance away from him.  Conference with attending, bedside nursing staff related to patient condition, needs, goals of care. PMT to continue to follow.  Plan:   At this point continue full scope/full code.  Continue CODE STATUS discussions.  Time for outcomes.  33 minutes  Quinn Axe, NP Palliative medicine team Team phone [phone] Greater than 50% of this time was spent counseling and coordinating care related to  the above assessment and plan.

## 2020-02-19 NOTE — Progress Notes (Signed)
Date of Admission:  02/01/2020     ID: Evan [name] is a 80 y.o. male Principal Problem:   Enterococcal bacteremia Active Problems:   Coronary artery disease   Diabetes mellitus ([place])   Essential hypertension   Atrial fibrillation, permanent (HCC)   S/P TAVR (transcatheter aortic valve replacement)   Diabetes mellitus type 2, uncomplicated (HCC)   Symptomatic anemia   Cirrhosis (HCC)   AKI (acute kidney injury) ([place])   MDS (myelodysplastic syndrome) (HCC)   Chronic systolic CHF (congestive heart failure) (HCC)   Gout flare   Acute respiratory failure due to COVID-19 [facility])   Acute metabolic encephalopathy   Elevated troponin   Chronic anticoagulation   Generalized weakness   Hypernatremia   Hyperammonemia (HCC)   Pressure injury of skin   Severe sepsis with septic shock (HCC)   Lactic acidosis   Acute respiratory failure with hypoxia (HCC)   Hypokalemia    Subjective: Not available  Medications:  . allopurinol  100 mg Oral Daily  . Chlorhexidine Gluconate Cloth  6 each Topical Daily  . docusate sodium  100 mg Oral Daily  . feeding supplement (NEPRO CARB STEADY)  237 mL Oral TID BM  . insulin aspart  0-20 Units Subcutaneous Q4H  . methylPREDNISolone (SOLU-MEDROL) injection  40 mg Intravenous Q12H  . multivitamin with minerals  1 tablet Oral Daily  . pantoprazole (PROTONIX) IV  40 mg Intravenous Q24H  . tamsulosin  0.4 mg Oral Daily    Objective: Vital signs in last 24 hours: Patient Vitals for the past 24 hrs:  BP Temp Temp src Pulse Resp SpO2  02/19/20 1200 (!) 158/77 - - 67 17 95 %  02/19/20 1100 (!) 149/75 - - 75 19 99 %  02/19/20 1000 (!) 158/90 - - 73 (!) 43 100 %  02/19/20 0900 (!) 147/75 - - 80 (!) 21 97 %  02/19/20 0800 (!) 142/64 - - 62 (!) 24 97 %  02/19/20 0722 - - - - - 96 %  02/19/20 0700 (!) 161/76 - - 73 (!) 21 91 %  02/19/20 0400 - (!) 96 F (35.6 C) Oral - - -  02/19/20 0000 - (!) 97.2 F (36.2 C) Axillary - - -  02/18/20 2000 139/79  (!) 96.8 F (36 C) Oral 75 (!) 21 92 %  02/18/20 1800 (!) 144/66 - - 75 19 95 %  02/18/20 1700 133/67 - - 74 16 92 %  02/18/20 1600 (!) 148/78 (!) 97.2 F (36.2 C) Oral 73 (!) 22 96 %  02/18/20 1548 - - - - - 98 %  02/18/20 1500 130/63 - - 69 18 95 %  02/18/20 1400 132/65 - - 74 20 98 %  02/18/20 1300 134/71 - - 78 (!) 21 92 %   PHYSICAL EXAM:  Lethargic He opens eyes and responds to simple questions On nasal cannula Chest b/l air entry Pacemaker site okay Hs irregular- well controlled  Lab Results Recent Labs    02/18/20 0700 02/19/20 0500  WBC 4.4 5.5  HGB 7.9* 8.0*  HCT 25.9* 26.7*  NA 158* 158*  K 3.1* 3.1*  CL 112* 112*  CO2 32 33*  BUN 76* 75*  CREATININE 1.53* 1.53*   Liver Panel Recent Labs    02/16/20 1525 02/17/20 0715  PROT 5.4* 5.8*  ALBUMIN 2.3* 2.3*  AST 30 35  ALT 21 22  ALKPHOS 84 82  BILITOT 3.9* 3.8*   Sedimentation Rate No results for input(s):  ESRSEDRATE in the last 72 hours. C-Reactive Protein Recent Labs    02/17/20 0715 02/18/20 0949  CRP 20.5* 13.6*    Microbiology: Micro 02/16/2020 blood culture Enterococcus faecalis 02/17/2020 blood cultures pending. 02/17/2020 urine culture no growth  Studies/Results: ECHOCARDIOGRAM COMPLETE  Result Date: 02/17/2020    ECHOCARDIOGRAM REPORT   Patient Name:   Evan [name] [facility] Date of Exam: 02/17/2020 Medical Rec #:  [id number]      Height:       72.0 in Accession #:    [id number]     Weight:       229.3 lb Date of Birth:  1940/05/28      BSA:          2.258 m Patient Age:    5 years       BP:           149/90 mmHg Patient Gender: M              HR:           96 bpm. Exam Location:  [facility] Procedure: 2D Echo, Color Doppler and Cardiac Doppler Indications:     R78.81 Bacteremia  History:         Patient has prior history of Echocardiogram examinations, most                  recent 12/08/2019. CAD, Pacemaker, Stroke; Risk                  Factors:Diabetes, Hypertension and Dyslipidemia.  Sonographer:      Charmayne Sheer RDCS (AE) Referring Phys:  [place]:[id number] Rosine Door Diagnosing Phys: Neoma Laming MD  Sonographer Comments: Suboptimal apical window. IMPRESSIONS  1. Left ventricular ejection fraction, by estimation, is 35 to 40%. The left ventricle has severely decreased function. The left ventricle demonstrates global hypokinesis. The left ventricular internal cavity size was severely dilated. There is mild concentric left ventricular hypertrophy. Left ventricular diastolic parameters are consistent with Grade III diastolic dysfunction (restrictive).  2. Right ventricular systolic function is severely reduced. The right ventricular size is severely enlarged.  3. Left atrial size was severely dilated.  4. Right atrial size was severely dilated.  5. The mitral valve is degenerative. Mild mitral valve regurgitation. No evidence of mitral stenosis. Severe mitral annular calcification.  6. Tricuspid valve regurgitation is moderate.  7. The aortic valve is calcified. Aortic valve regurgitation is mild. Mild aortic valve sclerosis is present, with no evidence of aortic valve stenosis.  8. The inferior vena cava is normal in size with greater than 50% respiratory variability, suggesting right atrial pressure of 3 mmHg. FINDINGS  Left Ventricle: Left ventricular ejection fraction, by estimation, is 35 to 40%. The left ventricle has severely decreased function. The left ventricle demonstrates global hypokinesis. The left ventricular internal cavity size was severely dilated. There is mild concentric left ventricular hypertrophy. Left ventricular diastolic parameters are consistent with Grade III diastolic dysfunction (restrictive). Right Ventricle: The right ventricular size is severely enlarged. No increase in right ventricular wall thickness. Right ventricular systolic function is severely reduced. Left Atrium: Left atrial size was severely dilated. Right Atrium: Right atrial size was severely dilated. Pericardium: There is no  evidence of pericardial effusion. Mitral Valve: The mitral valve is degenerative in appearance. Severe mitral annular calcification. Mild mitral valve regurgitation. No evidence of mitral valve stenosis. MV peak gradient, 6.5 mmHg. The mean mitral valve gradient is 2.0 mmHg. Tricuspid Valve: The tricuspid valve is normal in structure. Tricuspid  valve regurgitation is moderate . No evidence of tricuspid stenosis. Aortic Valve: The aortic valve is calcified. Aortic valve regurgitation is mild. Mild aortic valve sclerosis is present, with no evidence of aortic valve stenosis. Aortic valve mean gradient measures 9.0 mmHg. Aortic valve peak gradient measures 20.6 mmHg. Aortic valve area, by VTI measures 1.60 cm. Pulmonic Valve: The pulmonic valve was normal in structure. Pulmonic valve regurgitation is not visualized. No evidence of pulmonic stenosis. Aorta: The aortic root is normal in size and structure. Venous: The inferior vena cava is normal in size with greater than 50% respiratory variability, suggesting right atrial pressure of 3 mmHg. IAS/Shunts: The interatrial septum was not assessed.  LEFT VENTRICLE PLAX 2D LVIDd:         5.87 cm LVIDs:         4.74 cm LV PW:         1.28 cm LV IVS:        1.19 cm LVOT diam:     2.40 cm LV SV:         52 LV SV Index:   23 LVOT Area:     4.52 cm  LEFT ATRIUM            Index LA diam:      5.10 cm  2.26 cm/m LA Vol (A4C): 117.0 ml 51.82 ml/m  AORTIC VALVE                    PULMONIC VALVE AV Area (Vmax):    1.72 cm     PV Vmax:       1.45 m/s AV Area (Vmean):   1.79 cm     PV Vmean:      91.800 cm/s AV Area (VTI):     1.60 cm     PV VTI:        0.181 m AV Vmax:           227.00 cm/s  PV Peak grad:  8.4 mmHg AV Vmean:          134.000 cm/s PV Mean grad:  4.0 mmHg AV VTI:            0.322 m AV Peak Grad:      20.6 mmHg AV Mean Grad:      9.0 mmHg LVOT Vmax:         86.30 cm/s LVOT Vmean:        52.900 cm/s LVOT VTI:          0.114 m LVOT/AV VTI ratio: 0.35  AORTA Ao Root  diam: 2.70 cm MITRAL VALVE                TRICUSPID VALVE MV Area (PHT): 3.71 cm     TR Peak grad:   55.1 mmHg MV Area VTI:   1.51 cm     TR Vmax:        371.00 cm/s MV Peak grad:  6.5 mmHg MV Mean grad:  2.0 mmHg     SHUNTS MV Vmax:       1.27 m/s     Systemic VTI:  0.11 m MV Vmean:      70.1 cm/s    Systemic Diam: 2.40 cm MV Decel Time: 205 msec MV E velocity: 117.50 cm/s Neoma Laming MD Electronically signed by Neoma Laming MD Signature Date/Time: 02/17/2020/2:51:43 PM    Final       Assessment/Plan:  Enterococcus bacteremia.  He has a permanent pacemaker.  Also  has TAVR.  EF is 35 to 40%.  He has severely reduced left ventricular function and global hypokinesis of left ventricle.  Patient ideally needs TEE but he is too ill and also has Covid currently to undergo the procedure.  Continue ampicillin and ceftriaxone.  Will need for a minimum of 6 weeks.  Covid pneumonia with acute hypoxic respiratory failure.  Has received remdesivir and is on steroids.  Encephalopathy combination of bacteremia and Covid illness  A. fib rate controlled  Myelodysplastic syndrome with anemia, leukopenia and thrombocytopenia.  Received 3 units of PRBC  CKD Hypernatremia  Hyperbilirubinemia  Cirrhosis of the liver  Gout on allopurinol Patient is critically ill and his prognosis is guarded. Palliative on board Discussed the management with the care team.

## 2020-02-19 NOTE — Progress Notes (Signed)
Pt was alert in the beginning of the shift and progressively became more lethargic and less interactive. Pt's wife at bedside frequently stating that pt wants to [place]. On each attempt of nectar thick liquid pt had lots of coughing and towards the end of the day would not follow commands to swallow and let the liquid seep out of his mouth. At the time dinner tray was brought to pt, he was not alert enough to even attempt eating. Will pass on feeding instruction from speech therapist to night shift RN.

## 2020-02-19 NOTE — Progress Notes (Signed)
[place] [facility] [place] NAME: Evan [name]    MR#:  [id number]  DATE OF BIRTH:  July 12, 1940  SUBJECTIVE:  patient opens eyes to verbal command. Not able to hold meaningful conversation per RN not able to swallow applesauce without coughing. He is currently NPO. Disoriented to time place person. Remains on heated high flow nasal cannula oxygen  REVIEW OF SYSTEMS:   Review of Systems  Unable to perform ROS: Mental status change   Tolerating Diet:NPO Tolerating PT:   DRUG ALLERGIES:   Allergies  Allergen Reactions  . Niacin Other (See Comments) and Rash    Red face and burns.  . Niacin And Related Rash and Other (See Comments)    Red face and burns.  . Metformin And Related Diarrhea  . Statins Other (See Comments)    Myalgias, weakness    VITALS:  Blood pressure (!) 158/77, pulse 67, temperature (!) 96 F (35.6 C), temperature source Oral, resp. rate 17, height 6' (1.829 m), weight 104 kg, SpO2 95 %.  PHYSICAL EXAMINATION:   Physical Exam  GENERAL:  80 y.o.-year-old patient lying in the bed with  acute respiratory distress. Appears chronically ill fatigued and debilitated HEENT: Head atraumatic, normocephalic. Oropharynx and nasopharynx clear.  LUNGS: decreased breath sounds bilaterally, no wheezing, rales, rhonchi. No use of accessory muscles of respiration.  CARDIOVASCULAR: S1, S2 normal. No murmurs, rubs, or gallops.  ABDOMEN: Soft, nontender, nondistended. Bowel sounds present. No organomegaly or mass.  EXTREMITIES: No cyanosis, clubbing or edema b/l.    NEUROLOGIC: opens eyes to verbal command. Unable to participate in detail neuro- exam moves all extremities well. PSYCHIATRIC:  patient is alert and oriented to time place person SKIN:  Pressure Injury 02/16/20 Sacrum Mid Stage 1 -  Intact skin with non-blanchable redness of a localized area usually over a bony prominence. (Active)  02/16/20 1000  Location: Sacrum  Location  Orientation: Mid  Staging: Stage 1 -  Intact skin with non-blanchable redness of a localized area usually over a bony prominence.  Wound Description (Comments):   Present on Admission: Yes    LABORATORY PANEL:  CBC Recent Labs  Lab 02/19/20 0500  WBC 5.5  HGB 8.0*  HCT 26.7*  PLT 30*    Chemistries  Recent Labs  Lab 02/17/20 0715 02/18/20 0700 02/19/20 0500  NA 158*   < > 158*  K 2.9*   < > 3.1*  CL 109   < > 112*  CO2 34*   < > 33*  GLUCOSE 127*   < > 166*  BUN 74*   < > 75*  CREATININE 1.81*   < > 1.53*  CALCIUM 8.3*   < > 8.5*  MG 2.7*   < > 2.8*  AST 35  --   --   ALT 22  --   --   ALKPHOS 82  --   --   BILITOT 3.8*  --   --    < > = values in this interval not displayed.   Cardiac Enzymes No results for input(s): TROPONINI in the last 168 hours. RADIOLOGY:  ECHOCARDIOGRAM COMPLETE  Result Date: 02/17/2020    ECHOCARDIOGRAM REPORT   Patient Name:   Evan [name] [facility] Date of Exam: 02/17/2020 Medical Rec #:  [id number]      Height:       72.0 in Accession #:    [id number]     Weight:       229.3  lb Date of Birth:  Dec 27, 1940      BSA:          2.258 m Patient Age:    80 years       BP:           149/90 mmHg Patient Gender: M              HR:           96 bpm. Exam Location:  [facility] Procedure: 2D Echo, Color Doppler and Cardiac Doppler Indications:     R78.81 Bacteremia  History:         Patient has prior history of Echocardiogram examinations, most                  recent 12/08/2019. CAD, Pacemaker, Stroke; Risk                  Factors:Diabetes, Hypertension and Dyslipidemia.  Sonographer:     Charmayne Sheer RDCS (AE) Referring Phys:  [place]:[id number] Rosine Door Diagnosing Phys: Neoma Laming MD  Sonographer Comments: Suboptimal apical window. IMPRESSIONS  1. Left ventricular ejection fraction, by estimation, is 35 to 40%. The left ventricle has severely decreased function. The left ventricle demonstrates global hypokinesis. The left ventricular internal cavity size was severely dilated.  There is mild concentric left ventricular hypertrophy. Left ventricular diastolic parameters are consistent with Grade III diastolic dysfunction (restrictive).  2. Right ventricular systolic function is severely reduced. The right ventricular size is severely enlarged.  3. Left atrial size was severely dilated.  4. Right atrial size was severely dilated.  5. The mitral valve is degenerative. Mild mitral valve regurgitation. No evidence of mitral stenosis. Severe mitral annular calcification.  6. Tricuspid valve regurgitation is moderate.  7. The aortic valve is calcified. Aortic valve regurgitation is mild. Mild aortic valve sclerosis is present, with no evidence of aortic valve stenosis.  8. The inferior vena cava is normal in size with greater than 50% respiratory variability, suggesting right atrial pressure of 3 mmHg. FINDINGS  Left Ventricle: Left ventricular ejection fraction, by estimation, is 35 to 40%. The left ventricle has severely decreased function. The left ventricle demonstrates global hypokinesis. The left ventricular internal cavity size was severely dilated. There is mild concentric left ventricular hypertrophy. Left ventricular diastolic parameters are consistent with Grade III diastolic dysfunction (restrictive). Right Ventricle: The right ventricular size is severely enlarged. No increase in right ventricular wall thickness. Right ventricular systolic function is severely reduced. Left Atrium: Left atrial size was severely dilated. Right Atrium: Right atrial size was severely dilated. Pericardium: There is no evidence of pericardial effusion. Mitral Valve: The mitral valve is degenerative in appearance. Severe mitral annular calcification. Mild mitral valve regurgitation. No evidence of mitral valve stenosis. MV peak gradient, 6.5 mmHg. The mean mitral valve gradient is 2.0 mmHg. Tricuspid Valve: The tricuspid valve is normal in structure. Tricuspid valve regurgitation is moderate . No evidence  of tricuspid stenosis. Aortic Valve: The aortic valve is calcified. Aortic valve regurgitation is mild. Mild aortic valve sclerosis is present, with no evidence of aortic valve stenosis. Aortic valve mean gradient measures 9.0 mmHg. Aortic valve peak gradient measures 20.6 mmHg. Aortic valve area, by VTI measures 1.60 cm. Pulmonic Valve: The pulmonic valve was normal in structure. Pulmonic valve regurgitation is not visualized. No evidence of pulmonic stenosis. Aorta: The aortic root is normal in size and structure. Venous: The inferior vena cava is normal in size with greater than 50% respiratory variability, suggesting  right atrial pressure of 3 mmHg. IAS/Shunts: The interatrial septum was not assessed.  LEFT VENTRICLE PLAX 2D LVIDd:         5.87 cm LVIDs:         4.74 cm LV PW:         1.28 cm LV IVS:        1.19 cm LVOT diam:     2.40 cm LV SV:         52 LV SV Index:   23 LVOT Area:     4.52 cm  LEFT ATRIUM            Index LA diam:      5.10 cm  2.26 cm/m LA Vol (A4C): 117.0 ml 51.82 ml/m  AORTIC VALVE                    PULMONIC VALVE AV Area (Vmax):    1.72 cm     PV Vmax:       1.45 m/s AV Area (Vmean):   1.79 cm     PV Vmean:      91.800 cm/s AV Area (VTI):     1.60 cm     PV VTI:        0.181 m AV Vmax:           227.00 cm/s  PV Peak grad:  8.4 mmHg AV Vmean:          134.000 cm/s PV Mean grad:  4.0 mmHg AV VTI:            0.322 m AV Peak Grad:      20.6 mmHg AV Mean Grad:      9.0 mmHg LVOT Vmax:         86.30 cm/s LVOT Vmean:        52.900 cm/s LVOT VTI:          0.114 m LVOT/AV VTI ratio: 0.35  AORTA Ao Root diam: 2.70 cm MITRAL VALVE                TRICUSPID VALVE MV Area (PHT): 3.71 cm     TR Peak grad:   55.1 mmHg MV Area VTI:   1.51 cm     TR Vmax:        371.00 cm/s MV Peak grad:  6.5 mmHg MV Mean grad:  2.0 mmHg     SHUNTS MV Vmax:       1.27 m/s     Systemic VTI:  0.11 m MV Vmean:      70.1 cm/s    Systemic Diam: 2.40 cm MV Decel Time: 205 msec MV E velocity: 117.50 cm/s Neoma Laming MD  Electronically signed by Neoma Laming MD Signature Date/Time: 02/17/2020/2:51:43 PM    Final    ASSESSMENT AND PLAN:  Evan [name] is a 80 y.o. 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  He has had a mild cough that is chronic but no fever or shortness of breath  Septic shock, present on admission with a lactic acid up at  6.6.  Patient also had fever tachypnea, leukopenia and tachycardia.  Patient initially had hypotension, acute kidney injury, acute hypoxic respiratory failure and acute metabolic encephalopathy.   --Blood cultures positive for Enterococcus faecalis.  -- Patient on Rocephin and ampicillin.  Initial TTE negative for endocarditis.  Too sick for TEE at this point. -- Appreciate infectious disease input.  Acute on chronic hypoxic respiratory failure secondary to COVID-19 pneumonia. --  Patient on heated high flow nasal cannula at 98% FiO2 and 55 L.   --Continue Solu-Medrol and remdesivir.  With septic shock not a great candidate for baricitinib at this point. -- Chest x-ray continues to show bilateral pulmonary infiltrates.  Acute metabolic encephalopathy secondary to septic shock and COVID-19 infection. --  Patient able to answer few more questions.  Follows some commands but did not do well with the swallow evaluation.  Likely will not be able to keep up with nutritional needs.  Acute kidney injury. Hypernatremia/poor PO intake --  Baseline creatinine about 1.16.  Creatinine peaked at  2.05 and is now down to 1.53 with IV fluids.  Hypernatremia and hypokalemia.  Case discussed with pharmacist to change IV fluids to D5 W+ 20 KCl.  Myelodysplastic syndrome with severe anemia.  Patient initially had a hemoglobin of 5.7 and received 3 units of packed red blood cells.  Current hemoglobin 7.9.  Hematochezia.  Holding Eliquis for now  Chronic systolic congestive heart failure.  Continue to monitor fluid status closely.  Chronic atrial fibrillation with history of TAVR and acquired thrombophilia.   --Holding Eliquis.  Stroke risk will be higher with holding Eliquis.  Type 2 diabetes mellitus on sliding scale  Elevated troponin likely demand ischemia  Stage I sacral decubiti, present on admission.  See full description below  Palliative care consultation.  Patient currently full code.  Nutrition.  Nursing staff hesitant about placing NG tube at this point with thrombocytopenia  Pressure Injury 02/16/20 Sacrum Mid Stage 1 -  Intact skin with non-blanchable redness of a localized area usually over a bony prominence. (Active)  02/16/20 1000  Location: Sacrum  Location Orientation: Mid  Staging: Stage 1 -  Intact skin with non-blanchable redness of a localized area usually over a bony prominence.  Wound Description (Comments):   Present on Admission: Yes   patient overall has a very poor prognosis. I had a face-to-face conversation with patient's wife Mrs. Evan [name] in the ICU. She tells me patient is tired and giving up. She understands patient has a long road to recovery may deteriorate in the meantime. She wants to discuss further with palliative care today. [facility] notified.   Procedures: Family communication : wife in the ICU Consults : ID CODE STATUS: full DVT Prophylaxis :scd Level of care: Stepdown Status is: Inpatient  Remains inpatient appropriate because:Inpatient level of care appropriate due to severity of illness   Dispo: The patient is  from: Home              Anticipated d/c is to: TBD              Anticipated d/c date is: > 3 days              Patient currently is not medically stable to d/c.   patient is critically ill with multiple comorbid conditions. He is requiring significant amount of high flow nasal cannula oxygen. Overall poor prognosis. Palliative care to follow-up with wife today.       TOTAL TIME TAKING CARE OF  THIS PATIENT: 35 minutes.  >50% time spent on counselling and coordination of care  Note: This dictation was prepared with Dragon dictation along with smaller phrase technology. Any transcriptional errors that result from this process are unintentional.  Fritzi Mandes M.D    [facility]   CC: Primary care physician; Leonel Ramsay, MDPatient ID: Evan [name], male   DOB: Nov 26, 1940, 80 y.o.   MRN: [id number]

## 2020-02-19 NOTE — Progress Notes (Signed)
Increased fio2 to 98% on HHFNC and removed NRB, Sats in the mid to upper 90's with these settings.

## 2020-02-19 NOTE — Progress Notes (Signed)
Progress Note  Patient Name: Evan [name] Date of Encounter: 02/19/2020  [facility] Cardiologist: [name] [name], MD   Subjective   Appears tired, not verbal.  Wife at bedside.  Inpatient Medications    Scheduled Meds: . allopurinol  100 mg Oral Daily  . Chlorhexidine Gluconate Cloth  6 each Topical Daily  . docusate sodium  100 mg Oral Daily  . feeding supplement (NEPRO CARB STEADY)  237 mL Oral TID BM  . insulin aspart  0-20 Units Subcutaneous Q4H  . methylPREDNISolone (SOLU-MEDROL) injection  40 mg Intravenous Q12H  . multivitamin with minerals  1 tablet Oral Daily  . pantoprazole (PROTONIX) IV  40 mg Intravenous Q24H  . tamsulosin  0.4 mg Oral Daily   Continuous Infusions: . sodium chloride    . ampicillin (OMNIPEN) IV 2 g (02/19/20 0809)  . cefTRIAXone (ROCEPHIN)  IV 2 g (02/19/20 1610)  . dextrose 5 % with KCl 20 mEq / L 20 mEq (02/19/20 0511)  . remdesivir 100 mg in NS 100 mL 100 mg (02/19/20 0940)   PRN Meds: acetaminophen **OR** acetaminophen, ondansetron **OR** ondansetron (ZOFRAN) IV   Vital Signs    Vitals:   02/19/20 0900 02/19/20 1000 02/19/20 1100 02/19/20 1200  BP: (!) 147/75 (!) 158/90 (!) 149/75 (!) 158/77  Pulse: 80 73 75 67  Resp: (!) 21 (!) 43 19 17  Temp:      TempSrc:      SpO2: 97% 100% 99% 95%  Weight:      Height:        Intake/Output Summary (Last 24 hours) at 02/19/2020 1356 Last data filed at 02/19/2020 0800 Gross per 24 hour  Intake 2873.14 ml  Output 1200 ml  Net 1673.14 ml   Last 3 Weights 02/06/2020 02/04/2020 02/02/2020  Weight (lbs) 229 lb 4.5 oz 228 lb 13.4 oz 229 lb 15 oz  Weight (kg) 104 kg 103.8 kg 104.3 kg      Telemetry    V paced rhythm heart rate 65- Personally Reviewed  ECG    No new tracing obtained- Personally Reviewed  Physical Exam   GEN:  In bed, nonverbal, high flow nasal cannula Neck: No JVD Cardiac:  Regular, distant heart sounds, no murmurs Respiratory:  Poor inspiratory effort, decreased  breath sounds at bases GI: Soft, nontender, non-distended  MS: No edema; No deformity. Neuro:  Nonfocal , some Psych: Unable to assess  Labs    High Sensitivity Troponin:   Recent Labs  Lab 01/31/2020 1907 02/16/20 0047  TROPONINIHS 210* 219*      Chemistry Recent Labs  Lab 02/10/2020 1907 02/16/20 0749 02/16/20 1525 02/17/20 0715 02/18/20 0700 02/19/20 0500  NA 153*   < > 152* 158* 158* 158*  K 3.6   < > 3.4* 2.9* 3.1* 3.1*  CL 101   < > 105 109 112* 112*  CO2 34*   < > 33* 34* 32 33*  GLUCOSE 213*   < > 296* 127* 145* 166*  BUN 69*   < > 79* 74* 76* 75*  CREATININE 1.70*   < > 2.05* 1.81* 1.53* 1.53*  CALCIUM 8.7*   < > 8.1* 8.3* 8.3* 8.5*  PROT 6.5  --  5.4* 5.8*  --   --   ALBUMIN 2.8*  --  2.3* 2.3*  --   --   AST 32  --  30 35  --   --   ALT 25  --  21 22  --   --  ALKPHOS 107  --  84 82  --   --   BILITOT 4.2*  --  3.9* 3.8*  --   --   GFRNONAA 41*   < > 33* 38* 46* 46*  ANIONGAP 18*   < > 14 15 14 13    < > = values in this interval not displayed.     Hematology Recent Labs  Lab 02/17/20 0715 02/18/20 0700 02/19/20 0500  WBC 4.9 4.4 5.5  RBC 2.87* 2.69* 2.71*  HGB 8.4* 7.9* 8.0*  HCT 26.6* 25.9* 26.7*  MCV 92.7 96.3 98.5  MCH 29.3 29.4 29.5  MCHC 31.6 30.5 30.0  RDW 25.4* 25.6* 25.1*  PLT 34* 31* 30*    BNP Recent Labs  Lab March 05, 2020 1907  BNP 1,999.6*     DDimer No results for input(s): DDIMER in the last 168 hours.   Radiology    No results found.  Cardiac Studies   TTE 01/2020 1. Left ventricular ejection fraction, by estimation, is 35 to 40%. The  left ventricle has severely decreased function. The left ventricle  demonstrates global hypokinesis. The left ventricular internal cavity size  was severely dilated. There is mild  concentric left ventricular hypertrophy. Left ventricular diastolic  parameters are consistent with Grade III diastolic dysfunction  (restrictive).  2. Right ventricular systolic function is severely  reduced. The right  ventricular size is severely enlarged.  3. Left atrial size was severely dilated.  4. Right atrial size was severely dilated.  5. The mitral valve is degenerative. Mild mitral valve regurgitation. No  evidence of mitral stenosis. Severe mitral annular calcification.  6. Tricuspid valve regurgitation is moderate.  7. The aortic valve is calcified. Aortic valve regurgitation is mild.  Mild aortic valve sclerosis is present, with no evidence of aortic valve  stenosis.  8. The inferior vena cava is normal in size with greater than 50%  respiratory variability, suggesting right atrial pressure of 3 mmHg.   Patient Profile     80 y.o. male history of CAD/CABG 2005, AS/TAVR 2019, pacemaker, permanent A. fib presenting with weakness, confusion, fall oral intake.  Diagnosed with COVID-19 pneumonia and bacteremia.  Echocardiogram revealing moderately reduced EF, 35 to 40%.  Assessment & Plan    1.  HFrEF -Appears euvolemic -Not tolerating p.o. -Consider CHF meds pending clinical status, oral intake. -Overall prognosis is guarded  2.  COVID-19 pneumonia -Management as per critical care team  3.  A. Fib -Holding Eliquis due to anemia, thrombocytopenia  4.  Bacteremia -On antibiotics -No vegetation on transthoracic echo -Not safe for TEE  Total encounter time 35 minutes  Greater than 50% was spent in counseling and coordination of care with  wife at bedside       Signed, Kate Sable, MD  02/19/2020, 1:56 PM

## 2020-02-20 ENCOUNTER — Inpatient Hospital Stay: Payer: Medicare Other

## 2020-02-20 DIAGNOSIS — Z515 Encounter for palliative care: Secondary | ICD-10-CM | POA: Diagnosis not present

## 2020-02-20 DIAGNOSIS — Z952 Presence of prosthetic heart valve: Secondary | ICD-10-CM

## 2020-02-20 DIAGNOSIS — G9341 Metabolic encephalopathy: Secondary | ICD-10-CM | POA: Diagnosis not present

## 2020-02-20 DIAGNOSIS — A419 Sepsis, unspecified organism: Secondary | ICD-10-CM | POA: Diagnosis present

## 2020-02-20 DIAGNOSIS — Z7189 Other specified counseling: Secondary | ICD-10-CM | POA: Diagnosis not present

## 2020-02-20 DIAGNOSIS — U071 COVID-19: Secondary | ICD-10-CM | POA: Diagnosis not present

## 2020-02-20 DIAGNOSIS — R7881 Bacteremia: Secondary | ICD-10-CM | POA: Diagnosis not present

## 2020-02-20 DIAGNOSIS — E1159 Type 2 diabetes mellitus with other circulatory complications: Secondary | ICD-10-CM

## 2020-02-20 DIAGNOSIS — N189 Chronic kidney disease, unspecified: Secondary | ICD-10-CM

## 2020-02-20 DIAGNOSIS — R6521 Severe sepsis with septic shock: Secondary | ICD-10-CM

## 2020-02-20 DIAGNOSIS — B952 Enterococcus as the cause of diseases classified elsewhere: Secondary | ICD-10-CM | POA: Diagnosis not present

## 2020-02-20 DIAGNOSIS — I25708 Atherosclerosis of coronary artery bypass graft(s), unspecified, with other forms of angina pectoris: Secondary | ICD-10-CM

## 2020-02-20 DIAGNOSIS — K7030 Alcoholic cirrhosis of liver without ascites: Secondary | ICD-10-CM

## 2020-02-20 DIAGNOSIS — D469 Myelodysplastic syndrome, unspecified: Secondary | ICD-10-CM | POA: Diagnosis not present

## 2020-02-20 DIAGNOSIS — I5022 Chronic systolic (congestive) heart failure: Secondary | ICD-10-CM | POA: Diagnosis not present

## 2020-02-20 DIAGNOSIS — I4821 Permanent atrial fibrillation: Secondary | ICD-10-CM | POA: Diagnosis not present

## 2020-02-20 DIAGNOSIS — J9601 Acute respiratory failure with hypoxia: Secondary | ICD-10-CM

## 2020-02-20 LAB — CBC
HCT: 24.5 % — ABNORMAL LOW (ref 39.0–52.0)
Hemoglobin: 7.4 g/dL — ABNORMAL LOW (ref 13.0–17.0)
MCH: 29.7 pg (ref 26.0–34.0)
MCHC: 30.2 g/dL (ref 30.0–36.0)
MCV: 98.4 fL (ref 80.0–100.0)
Platelets: 29 10*3/uL — CL (ref 150–400)
RBC: 2.49 MIL/uL — ABNORMAL LOW (ref 4.22–5.81)
RDW: 24.6 % — ABNORMAL HIGH (ref 11.5–15.5)
WBC: 5.7 10*3/uL (ref 4.0–10.5)
nRBC: 4.7 % — ABNORMAL HIGH (ref 0.0–0.2)

## 2020-02-20 LAB — FIBRIN DERIVATIVES D-DIMER (ARMC ONLY): Fibrin derivatives D-dimer (ARMC): 623.53 ng/mL (FEU) — ABNORMAL HIGH (ref 0.00–499.00)

## 2020-02-20 LAB — GLUCOSE, CAPILLARY
Glucose-Capillary: 141 mg/dL — ABNORMAL HIGH (ref 70–99)
Glucose-Capillary: 151 mg/dL — ABNORMAL HIGH (ref 70–99)
Glucose-Capillary: 154 mg/dL — ABNORMAL HIGH (ref 70–99)
Glucose-Capillary: 157 mg/dL — ABNORMAL HIGH (ref 70–99)
Glucose-Capillary: 171 mg/dL — ABNORMAL HIGH (ref 70–99)
Glucose-Capillary: 201 mg/dL — ABNORMAL HIGH (ref 70–99)

## 2020-02-20 LAB — BLOOD GAS, ARTERIAL
Acid-Base Excess: 9.1 mmol/L — ABNORMAL HIGH (ref 0.0–2.0)
Bicarbonate: 33.5 mmol/L — ABNORMAL HIGH (ref 20.0–28.0)
FIO2: 0.8
MECHVT: 500 mL
O2 Saturation: 99.7 %
PEEP: 10 cmH2O
Patient temperature: 37
RATE: 18 resp/min
pCO2 arterial: 45 mmHg (ref 32.0–48.0)
pH, Arterial: 7.48 — ABNORMAL HIGH (ref 7.350–7.450)
pO2, Arterial: 187 mmHg — ABNORMAL HIGH (ref 83.0–108.0)

## 2020-02-20 LAB — FERRITIN: Ferritin: 1233 ng/mL — ABNORMAL HIGH (ref 24–336)

## 2020-02-20 LAB — BASIC METABOLIC PANEL
Anion gap: 11 (ref 5–15)
BUN: 61 mg/dL — ABNORMAL HIGH (ref 8–23)
CO2: 31 mmol/L (ref 22–32)
Calcium: 8 mg/dL — ABNORMAL LOW (ref 8.9–10.3)
Chloride: 116 mmol/L — ABNORMAL HIGH (ref 98–111)
Creatinine, Ser: 1.31 mg/dL — ABNORMAL HIGH (ref 0.61–1.24)
GFR, Estimated: 56 mL/min — ABNORMAL LOW (ref >60)
Glucose, Bld: 177 mg/dL — ABNORMAL HIGH (ref 70–99)
Potassium: 3.4 mmol/L — ABNORMAL LOW (ref 3.5–5.1)
Sodium: 158 mmol/L — ABNORMAL HIGH (ref 135–145)

## 2020-02-20 LAB — HEMOGLOBIN AND HEMATOCRIT, BLOOD
HCT: 26.3 % — ABNORMAL LOW (ref 39.0–52.0)
Hemoglobin: 8.3 g/dL — ABNORMAL LOW (ref 13.0–17.0)

## 2020-02-20 LAB — PREPARE RBC (CROSSMATCH)

## 2020-02-20 LAB — MAGNESIUM: Magnesium: 2.6 mg/dL — ABNORMAL HIGH (ref 1.7–2.4)

## 2020-02-20 LAB — PHOSPHORUS: Phosphorus: 3.2 mg/dL (ref 2.5–4.6)

## 2020-02-20 LAB — C-REACTIVE PROTEIN: CRP: 6.2 mg/dL — ABNORMAL HIGH (ref <1.0)

## 2020-02-20 MED ORDER — ETOMIDATE 2 MG/ML IV SOLN: 30.0000 mg | Freq: Once | INTRAVENOUS | Status: AC

## 2020-02-20 MED ORDER — POLYETHYLENE GLYCOL 3350 17 G PO PACK
17.0000 g | PACK | Freq: Every day | ORAL | Status: DC
  Administered 2020-02-22 – 2020-02-24 (×3): 17 g
  Filled 2020-02-20 (×4): qty 1

## 2020-02-20 MED ORDER — ETOMIDATE 2 MG/ML IV SOLN
INTRAVENOUS | Status: AC
  Administered 2020-02-20: 30 mg via INTRAVENOUS
  Filled 2020-02-20: qty 20

## 2020-02-20 MED ORDER — POLYETHYLENE GLYCOL 3350 17 G PO PACK: 17.0000 g | PACK | Freq: Every day | ORAL | Status: DC | PRN

## 2020-02-20 MED ORDER — DOCUSATE SODIUM 100 MG PO CAPS: 100.0000 mg | ORAL_CAPSULE | Freq: Two times a day (BID) | ORAL | Status: DC | PRN

## 2020-02-20 MED ORDER — SUCCINYLCHOLINE CHLORIDE 20 MG/ML IJ SOLN
INTRAMUSCULAR | Status: AC
  Administered 2020-02-20: 160 mg via INTRAVENOUS
  Filled 2020-02-20: qty 1

## 2020-02-20 MED ORDER — FENTANYL 2500MCG IN NS 250ML (10MCG/ML) PREMIX INFUSION
INTRAVENOUS | Status: AC
  Administered 2020-02-20: 25 ug/h via INTRAVENOUS
  Filled 2020-02-20: qty 250

## 2020-02-20 MED ORDER — MIDAZOLAM HCL 2 MG/2ML IJ SOLN
INTRAMUSCULAR | Status: AC
  Administered 2020-02-20: 2 mg via INTRAVENOUS
  Filled 2020-02-20: qty 2

## 2020-02-20 MED ORDER — SODIUM CHLORIDE 0.9 % IV SOLN
2.0000 g | INTRAVENOUS | Status: DC
  Administered 2020-02-20 – 2020-02-22 (×13): 2 g via INTRAVENOUS
  Filled 2020-02-20: qty 2000
  Filled 2020-02-20 (×2): qty 2
  Filled 2020-02-20: qty 2000
  Filled 2020-02-20 (×4): qty 2
  Filled 2020-02-20: qty 2000
  Filled 2020-02-20: qty 2
  Filled 2020-02-20 (×3): qty 2000
  Filled 2020-02-20 (×2): qty 2
  Filled 2020-02-20: qty 2000

## 2020-02-20 MED ORDER — SUCCINYLCHOLINE CHLORIDE 20 MG/ML IJ SOLN: 160.0000 mg | Freq: Once | INTRAMUSCULAR | Status: AC

## 2020-02-20 MED ORDER — MIDAZOLAM HCL 2 MG/2ML IJ SOLN: 2.0000 mg | Freq: Once | INTRAMUSCULAR | Status: AC

## 2020-02-20 MED ORDER — SODIUM CHLORIDE 0.9% IV SOLUTION: Freq: Once | INTRAVENOUS | Status: AC

## 2020-02-20 MED ORDER — PROPOFOL 1000 MG/100ML IV EMUL
INTRAVENOUS | Status: AC
  Administered 2020-02-20: 10 ug/kg/min via INTRAVENOUS
  Filled 2020-02-20: qty 100

## 2020-02-20 MED ORDER — NOREPINEPHRINE 4 MG/250ML-% IV SOLN
0.0000 ug/min | INTRAVENOUS | Status: DC
  Administered 2020-02-20: 2 ug/min via INTRAVENOUS
  Administered 2020-02-21: 4 ug/min via INTRAVENOUS
  Filled 2020-02-20 (×2): qty 250

## 2020-02-20 MED ORDER — FENTANYL 2500MCG IN NS 250ML (10MCG/ML) PREMIX INFUSION
0.0000 ug/h | INTRAVENOUS | Status: DC
  Administered 2020-02-21: 75 ug/h via INTRAVENOUS
  Filled 2020-02-20 (×2): qty 250

## 2020-02-20 MED ORDER — DOCUSATE SODIUM 50 MG/5ML PO LIQD
100.0000 mg | Freq: Two times a day (BID) | ORAL | Status: DC
  Administered 2020-02-21 – 2020-02-24 (×8): 100 mg
  Filled 2020-02-20 (×9): qty 10

## 2020-02-20 MED ORDER — PROPOFOL 1000 MG/100ML IV EMUL
5.0000 ug/kg/min | INTRAVENOUS | Status: DC
  Administered 2020-02-20 (×2): 35 ug/kg/min via INTRAVENOUS
  Administered 2020-02-21: 18:00:00 15 ug/kg/min via INTRAVENOUS
  Administered 2020-02-21 (×3): 40 ug/kg/min via INTRAVENOUS
  Administered 2020-02-22: 15 ug/kg/min via INTRAVENOUS
  Administered 2020-02-24: 20 ug/kg/min via INTRAVENOUS
  Administered 2020-02-24: 10:00:00 30 ug/kg/min via INTRAVENOUS
  Administered 2020-02-24 – 2020-02-25 (×4): 20 ug/kg/min via INTRAVENOUS
  Administered 2020-02-26 (×3): 30 ug/kg/min via INTRAVENOUS
  Filled 2020-02-20 (×18): qty 100

## 2020-02-20 NOTE — Consult Note (Signed)
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 He was admitted with acute respiratory failure secondary to COVID pneumonia, AKI which has improved, and enterococcal bacteremia. Patient was intubated today 1/27 and placed on vent. Pharmacy has been consulted for electrolyte monitoring and replacement.   Nutrition: When stable for tube feed per RD: Vital 69mL/hr + Prosource 84mL 5 times daily + Free water flushes 91mL q4h  MIVF: D5W KCl 20 mEq / L @ 173mL/hr   Goal of Therapy:  Electrolytes WNL   Plan:  Na 158, K 3.4, Continue D5W w/ KCL 20 mEq/L @100mL /hr (2.4L/day, 48 mEq K)  Check electrolytes in am.  Pharmacy will continue to monitor and evaluate electrolytes.      Emogene Morgan, Pharmacy Student  02/20/2020

## 2020-02-20 NOTE — Progress Notes (Signed)
CRITICAL CARE NOTE  SYNOPSIS  80 year old male presenting to the ED with poor p.o. take confusion and generalized weakness to the point of being unable to ambulate.  COVID 19 + sCHF+Liver cirrhosis  ED course: On initial vitals patient was afebrile, BP 119/76, HR 66, tachypneic in the 20's with SPO2 at 100% on 4 L nasal cannula.  Labs significant for: Hemoglobin 5.7 (was 8.1- 11 days prior), platelets 46,000 (reduced from 69,000), sodium 153, creatinine 1.77 (prior 1.16), ammonia 38, troponin 210, BNP 2000, COVID PCR positive.  CT head showed no acute abnormalities.  Patient was started on a blood transfusion 2 units PRBCs.  Hospitalist service consulted for admission. Patient remains roomed in emergency department while under hospitalist service pending admission.  Patient became hypoxic requiring 6 L nasal cannula.  BP also dropped with pressures in the high 02I low 09B systolically maps 35-32.  Lactic increased from 2.1-6.6.  Sepsis criteria initiated, PCCM consulted for further management and monitoring due to worsening respiratory status and possible impending shock.  Interval history: last seen by our service on 1/24. Since then, patient has been monitored on stepdown status here in the ICU. He has had increasing HFNC requirements and become increasing encephalopathic. After extensive discussion with wife and patient, patient would like to be full code and have a trial of mechanical ventilation. Palliative has been following along.   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  SpO2: 92 % O2 Flow Rate (L/min): 55 L/min FiO2 (%): (S) 90 %  Estimated body mass index is 31.1 kg/m as calculated from the following:   Height as of this encounter: 6' (1.829 m).   Weight as of this encounter: 104 kg.  SIGNIFICANT EVENTS   REVIEW OF SYSTEMS  PATIENT IS UNABLE TO PROVIDE COMPLETE REVIEW OF SYSTEMS DUE TO SEVERE CRITICAL ILLNESS   Pressure Injury 02/16/20 Sacrum Mid Stage 1 -  Intact skin with non-blanchable redness of a localized area usually over a bony prominence. (Active)  02/16/20 1000  Location: Sacrum  Location Orientation: Mid  Staging: Stage 1 -  Intact skin with non-blanchable redness of a localized area usually over a bony prominence.  Wound Description (Comments):   Present on Admission: Yes   PHYSICAL EXAMINATION:  GENERAL:critically ill appearing, +resp distress HEAD: Normocephalic, atraumatic.  EYES: Pupils equal, round, reactive to light.  No scleral icterus.  MOUTH: Moist mucosal  membrane. NECK: Supple.  PULMONARY: +rhonchi, +wheezing CARDIOVASCULAR: S1 and S2. Regular rate and rhythm. No murmurs, rubs, or gallops.  GASTROINTESTINAL: Soft, nontender, -distended.  Positive bowel sounds.   MUSCULOSKELETAL: No swelling, clubbing, or edema.   NEUROLOGIC: obtunded, GCS<8 SKIN:intact,warm,dry  MEDICATIONS: I have reviewed all medications and confirmed regimen as documented   CULTURE RESULTS   Recent Results (from the past 240 hour(s))  SARS Coronavirus 2 by RT PCR (hospital order, performed in [facility] hospital lab) Nasopharyngeal Nasopharyngeal Swab     Status: Abnormal   Collection Time: 02/02/2020  7:07 PM   Specimen: Nasopharyngeal Swab  Result Value Ref Range Status   SARS Coronavirus 2 POSITIVE (A) NEGATIVE Final    Comment: RESULT CALLED TO, READ BACK BY AND VERIFIED WITH: CHRISTINE HALLAS AT 2050 02/02/2020. MF (NOTE) SARS-CoV-2 target nucleic acids are DETECTED  SARS-CoV-2 RNA is generally detectable in upper respiratory specimens  during the acute phase of infection.  Positive results are indicative  of the presence of the identified virus, but do not rule out bacterial infection or co-infection with other pathogens not detected by the test.  Clinical correlation with patient history and  other diagnostic information is necessary to determine patient infection status.  The expected result is negative.  Fact Sheet for Patients:   StrictlyIdeas.no   Fact Sheet for Healthcare Providers:   BankingDealers.co.za    This test is not yet approved or cleared by the [place] FDA and  has been authorized for detection and/or diagnosis of SARS-CoV-2 by FDA under an Emergency Use Authorization (EUA).  This EUA will remain in effect (meaning this  test can be used) for the duration of  the COVID-19 declaration under Section 564(b)(1) of the Act, 21 U.S.C. section 360-bbb-3(b)(1), unless the authorization is terminated or revoked sooner.  Performed at [facility], [address] [place], [place] [address]   CULTURE, BLOOD (ROUTINE X 2) w Reflex to ID Panel     Status: Abnormal (Preliminary result)   Collection Time: 02/16/20  2:11 AM   Specimen: BLOOD   Result Value Ref Range Status   Specimen Description   Final    BLOOD LEFT ANTECUBITAL Performed at [facility], [address], [place], [place] [address]    Special Requests   Final    BOTTLES DRAWN AEROBIC AND ANAEROBIC Blood Culture results may not be optimal due to an excessive volume of blood received in culture bottles Performed at [facility], [address] [place], [place] [address]    Culture  Setup Time   Final    IN BOTH AEROBIC AND ANAEROBIC BOTTLES GRAM POSITIVE COCCI CRITICAL VALUE NOTED.  VALUE IS CONSISTENT WITH PREVIOUSLY REPORTED AND CALLED VALUE. Performed at [facility], [address] [place], [place] [address]    Culture (A)  Final    ENTEROCOCCUS FAECALIS SUSCEPTIBILITIES PERFORMED ON PREVIOUS CULTURE WITHIN THE LAST 5 DAYS. CULTURE REINCUBATED FOR BETTER GROWTH Performed at [facility], [place] [address] [place], [place] [address]    Report Status PENDING  Incomplete  CULTURE, BLOOD (ROUTINE X 2) w Reflex to ID Panel     Status: Abnormal   Collection Time: 02/16/20  2:11 AM   Specimen: BLOOD  Result Value Ref Range Status   Specimen Description   Final    BLOOD BLOOD RIGHT FOREARM Performed at [facility], [address] [place], [place] [address]    Special Requests   Final  BOTTLES DRAWN AEROBIC AND ANAEROBIC Blood Culture adequate volume Performed at [facility], [address] [place], [place] [address]    Culture  Setup Time   Final    IN BOTH AEROBIC AND ANAEROBIC BOTTLES GRAM POSITIVE COCCI CRITICAL RESULT CALLED TO, READ BACK BY AND VERIFIED WITH: BRANDON BEERS 02/16/20 AT 1229 BY ACR Performed at [facility], [place] [address] [place], [place] [address]    Culture ENTEROCOCCUS FAECALIS (A)  Final   Report Status 02/18/2020 FINAL  Final   Organism ID, Bacteria ENTEROCOCCUS FAECALIS  Final      Susceptibility   Enterococcus faecalis - MIC*    AMPICILLIN <=2 SENSITIVE  Sensitive     VANCOMYCIN 2 SENSITIVE Sensitive     GENTAMICIN SYNERGY SENSITIVE Sensitive     * ENTEROCOCCUS FAECALIS  Blood Culture ID Panel (Reflexed)     Status: Abnormal   Collection Time: 02/16/20  2:11 AM  Result Value Ref Range Status   Enterococcus faecalis DETECTED (A) NOT DETECTED Final    Comment: CRITICAL RESULT CALLED TO, READ BACK BY AND VERIFIED WITH: BRANDON BEERS 02/16/20 AT 1229 BY ACR    Enterococcus Faecium NOT DETECTED NOT DETECTED Final   Listeria monocytogenes NOT DETECTED NOT DETECTED Final   Staphylococcus species NOT DETECTED NOT DETECTED Final   Staphylococcus aureus (BCID) NOT DETECTED NOT DETECTED Final   Staphylococcus epidermidis NOT DETECTED NOT DETECTED Final   Staphylococcus lugdunensis NOT DETECTED NOT DETECTED Final   Streptococcus species NOT DETECTED NOT DETECTED Final   Streptococcus agalactiae NOT DETECTED NOT DETECTED Final   Streptococcus pneumoniae NOT DETECTED NOT DETECTED Final   Streptococcus pyogenes NOT DETECTED NOT DETECTED Final   A.calcoaceticus-baumannii NOT DETECTED NOT DETECTED Final   Bacteroides fragilis NOT DETECTED NOT DETECTED Final   Enterobacterales NOT DETECTED NOT DETECTED Final   Enterobacter cloacae complex NOT DETECTED NOT DETECTED Final   Escherichia coli NOT DETECTED NOT DETECTED Final   Klebsiella aerogenes NOT DETECTED NOT DETECTED Final   Klebsiella oxytoca NOT DETECTED NOT DETECTED Final   Klebsiella pneumoniae NOT DETECTED NOT DETECTED Final   Proteus species NOT DETECTED NOT DETECTED Final   Salmonella species NOT DETECTED NOT DETECTED Final   Serratia marcescens NOT DETECTED NOT DETECTED Final   Haemophilus influenzae NOT DETECTED NOT DETECTED Final   Neisseria meningitidis NOT DETECTED NOT DETECTED Final   Pseudomonas aeruginosa NOT DETECTED NOT DETECTED Final   Stenotrophomonas maltophilia NOT DETECTED NOT DETECTED Final   Candida albicans NOT DETECTED NOT DETECTED Final   Candida auris NOT DETECTED NOT  DETECTED Final   Candida glabrata NOT DETECTED NOT DETECTED Final   Candida krusei NOT DETECTED NOT DETECTED Final   Candida parapsilosis NOT DETECTED NOT DETECTED Final   Candida tropicalis NOT DETECTED NOT DETECTED Final   Cryptococcus neoformans/gattii NOT DETECTED NOT DETECTED Final   Vancomycin resistance NOT DETECTED NOT DETECTED Final    Comment: Performed at [facility], [address] [place], [place] [address]  CULTURE, BLOOD (ROUTINE X 2) w Reflex to ID Panel     Status: None (Preliminary result)   Collection Time: 02/17/20  2:16 AM   Specimen: BLOOD  Result Value Ref Range Status   Specimen Description BLOOD BLOOD LEFT HAND  Final   Special Requests   Final    BOTTLES DRAWN AEROBIC AND ANAEROBIC Blood Culture adequate volume   Culture   Final    NO GROWTH 3 DAYS Performed at [facility], [address] [place], [place] [address]    Report Status PENDING  Incomplete  CULTURE, BLOOD (ROUTINE X 2) w Reflex to ID Panel     Status: None (Preliminary result)   Collection Time: 02/17/20  2:16 AM   Specimen: BLOOD  Result Value Ref Range Status   Specimen Description BLOOD BLOOD RIGHT HAND  Final   Special Requests   Final    BOTTLES DRAWN AEROBIC AND ANAEROBIC Blood Culture adequate volume   Culture   Final    NO GROWTH 3 DAYS Performed at [facility], [address], [place], [place] [address]    Report Status PENDING  Incomplete  Urine Culture     Status: None   Collection Time: 02/17/20  7:15 AM   Specimen: Urine, Random  Result Value Ref Range Status   Specimen Description   Final    URINE, RANDOM Performed at [facility], [address] [place], [place] [address]    Special Requests   Final    NONE Performed at [facility], [address] [place], [place] [address]    Culture   Final    NO GROWTH Performed at [facility], [place] [address], [place], [place] [address]    Report Status 02/18/2020  FINAL  Final  MRSA PCR Screening     Status: None   Collection Time: 02/17/20  7:15 AM  Result Value Ref Range Status   MRSA by PCR NEGATIVE NEGATIVE Final    Comment:        The GeneXpert MRSA Assay (FDA approved for NASAL specimens only), is one component of a comprehensive MRSA colonization surveillance program. It is not intended to diagnose MRSA infection nor to guide or monitor treatment for MRSA infections. Performed at [facility], [address], [place], [place] [address]       IMAGING   DG Chest 1 View  Result Date: 02/20/2020 Bronson Ing, MD     02/20/2020 10:36 AM Endotracheal Intubation: Patient required placement of an artificial airway secondary to Respiratory Failure Consent: Emergent. Hand washing performed prior to starting the procedure. Medications administered for sedation prior to procedure: Midazolam 2 mg IV,  Etomidate 30 mg IV, Succinylcholine 160 mcg IV. A time out procedure was called and correct patient, name, & ID confirmed. Needed supplies and equipment were assembled and checked to include ETT, 10 ml syringe, Glidescope, Mac and Miller blades, suction, oxygen and bag mask valve, end tidal CO2 monitor. Patient was positioned to align the mouth and pharynx to facilitate visualization of the glottis. Heart rate, SpO2 and blood pressure was continuously monitored during the procedure. Pre-oxygenation was conducted prior to intubation and endotracheal tube was placed through the vocal cords into the trachea.  The artificial airway was placed under direct visualization via glidescope route using a 8-0 ETT on the first attempt. ETT was secured at 26 cm mark at the teeth. Placement was confirmed by auscuitation of lungs with good breath sounds bilaterally and no stomach sounds.  Condensation was noted on endotracheal tube.  Pulse ox 98%.  CO2 detector in place with appropriate color change. Complications: None . Operator: Daxx Tiggs. Chest radiograph ordered and  pending. Comments: OGT placed via glidescope. Arcelia Jew MD intensivist   DG Abd 1 View  Result Date: 02/20/2020 Bronson Ing, MD     02/20/2020 10:36 AM Endotracheal Intubation: Patient required placement of an artificial airway secondary to Respiratory Failure Consent: Emergent. Hand washing performed prior to starting  the procedure. Medications administered for sedation prior to procedure: Midazolam 2 mg IV,  Etomidate 30 mg IV, Succinylcholine 160 mcg IV. A time out procedure was called and correct patient, name, & ID confirmed. Needed supplies and equipment were assembled and checked to include ETT, 10 ml syringe, Glidescope, Mac and Miller blades, suction, oxygen and bag mask valve, end tidal CO2 monitor. Patient was positioned to align the mouth and pharynx to facilitate visualization of the glottis. Heart rate, SpO2 and blood pressure was continuously monitored during the procedure. Pre-oxygenation was conducted prior to intubation and endotracheal tube was placed through the vocal cords into the trachea.  The artificial airway was placed under direct visualization via glidescope route using a 8-0 ETT on the first attempt. ETT was secured at 26 cm mark at the teeth. Placement was confirmed by auscuitation of lungs with good breath sounds bilaterally and no stomach sounds.  Condensation was noted on endotracheal tube.  Pulse ox 98%.  CO2 detector in place with appropriate color change. Complications: None . Operator: Steele Stracener. Chest radiograph ordered and pending. Comments: OGT placed via glidescope. Arcelia Jew MD intensivist   Nutrition Status: Nutrition Problem: Increased nutrient needs Etiology: catabolic illness (COVID 19, CHF, Myelodysplastic syndrome, cirrhosis) Signs/Symptoms: estimated needs    BMP Latest Ref Rng & Units 02/20/2020 02/19/2020 02/18/2020  Glucose 70 - 99 mg/dL 177(H) 166(H) 145(H)  BUN 8 - 23 mg/dL 61(H) 75(H) 76(H)  Creatinine 0.61 - 1.24 mg/dL 1.31(H) 1.53(H) 1.53(H)  BUN/Creat  Ratio 10 - 24 - - -  Sodium 135 - 145 mmol/L 158(H) 158(H) 158(H)  Potassium 3.5 - 5.1 mmol/L 3.4(L) 3.1(L) 3.1(L)  Chloride 98 - 111 mmol/L 116(H) 112(H) 112(H)  CO2 22 - 32 mmol/L 31 33(H) 32  Calcium 8.9 - 10.3 mg/dL 8.0(L) 8.5(L) 8.3(L)    Indwelling Urinary Catheter continued, requirement due to   Reason to continue Indwelling Urinary Catheter strict Intake/Output monitoring for hemodynamic instability   Central Line/ continued, requirement due to  Reason to continue [place] of central venous pressure or other hemodynamic parameters and poor IV access    ASSESSMENT AND PLAN SYNOPSIS Severe ACUTE Hypoxic and Hypercapnic Respiratory Failure secondary to COVID pneumonia -continue Full MV support -continue Bronchodilator Therapy -Wean Fio2 and PEEP as tolerated -will perform SAT/SBT when respiratory parameters are met -VAP/VENT bundle implementation -Continue solumedrol and remdesivir -Wife would like trial of mechanical ventilation -Palliative care following along  ACUTE KIDNEY INJURY/Renal Failure/Hypernatremia/poor po intake -continue Foley Catheter-assess need -Avoid nephrotoxic agents -Follow urine output, BMP -Ensure adequate renal perfusion, optimize oxygenation -Renal dose medications -Baseline Cr is around 1.1 -Currently down trending -Fluids changed to D5 and 20K; K shows improvement -Will start feeds tomorrow   Myelodysplastic syndrome with severe anemia. Patient initially had a hemoglobin of 5.7 and received 3 units of packed red blood cells. Current hemoglobin 7.9.  NEUROLOGY - intubated and sedated - minimal sedation to achieve a RASS goal: -1 Wake up assessment pending  Acute toxic metabolic encephalopathy, need for sedation Goal RASS -2 to -3  SHOCK-SEPSIS/HYPOVOLUMIC/CARDIOGENIC -use vasopressors to keep MAP>65 -follow ABG and LA -follow up cultures  SEPTIC SHOCK SECONDARY TO E. Faecalis infection -Blood cultures positive for E.  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 -Continue to hold eliquis given thrombocytopenia and hematochezia  MDS with severe anemia -Followed by [facility] clinic oncology -Required 3u prbc at admission for anemia -will transfuse again today to keep Hb above 8  ID -continue IV abx as prescibed -follow up cultures  GI GI PROPHYLAXIS as indicated  NUTRITIONAL STATUS DIET--> start TFs tomorrow Constipation protocol as indicated  ENDO -will use ICU hypoglycemic\Hyperglycemia protocol if needed -history of DM2 -on SSI  ELECTROLYTES -follow labs as needed -replace as needed -pharmacy consultation and following  DVT/GI PRX ordered and assessed TRANSFUSIONS AS NEEDED MONITOR FSBS I Assessed the need for Labs I Assessed the need for Foley I Assessed the need for Central Venous Line Family Discussion when available I Assessed the need for Mobilization I made an Assessment of medications to be adjusted accordingly Safety Risk assessment Completed  Critical Care Time devoted to patient care services described in this note is 100 minutes.   Overall, patient is critically ill, prognosis is guarded.  Patient with Multiorgan failure and at high risk for cardiac arrest and death.   CASE DISCUSSED IN MULTIDISCIPLINARY ROUNDS WITH ICU TEAM  Arcelia Jew MD Intensivist

## 2020-02-20 NOTE — Progress Notes (Signed)
[facility] [place] NAME: Evan [name]    MR#:  [id number]  DATE OF BIRTH:  [birth year]  SUBJECTIVE:  patient remains very lethargic. Cont to have difficulty swallowing. veryu poor po intake Wife in the room desats easily despite [place] Remains on heated high flow nasal cannula oxygen  REVIEW OF SYSTEMS:   Review of Systems  Unable to perform ROS: Mental status change   Tolerating Diet:NPO Tolerating PT: too sick to participate  DRUG ALLERGIES:   Allergies  Allergen Reactions  . Niacin Other (See Comments) and Rash    Red face and burns.  . Niacin And Related Rash and Other (See Comments)    Red face and burns.  . Metformin And Related Diarrhea  . Statins Other (See Comments)    Myalgias, weakness    VITALS:  Blood pressure 133/78, pulse 73, temperature (!) 96.6 F (35.9 C), temperature source Axillary, resp. rate (!) 22, height 6' (1.829 m), weight 104 kg, SpO2 92 %.  PHYSICAL EXAMINATION:   Physical Exam  GENERAL:  80 y.o.-year-old patient lying in the bed with  acute respiratory distress. Appears chronically ill fatigued and debilitated lethargic HEENT: Head atraumatic, normocephalic. Oropharynx and nasopharynx clear.  LUNGS: decreased breath sounds bilaterally, no wheezing, rales, rhonchi. +use of accessory muscles of respiration. Abdominal breathing CARDIOVASCULAR: S1, S2 normal. No murmurs, rubs, or gallops.  ABDOMEN: Soft, nontender, nondistended.few Bowel sounds present. EXTREMITIES: No cyanosis, clubbing  + edema b/l.    NEUROLOGIC: opens eyes to verbal command mometarily. Remains lethargic. Unable to participate in detail neuro- exam moves all extremities well. PSYCHIATRIC: lethargic SKIN:  Pressure Injury 02/16/20 Sacrum Mid Stage 1 -  Intact skin with non-blanchable redness of a localized area usually over a bony prominence. (Active)  02/16/20 1000  Location: Sacrum  Location Orientation: Mid  Staging:  Stage 1 -  Intact skin with non-blanchable redness of a localized area usually over a bony prominence.  Wound Description (Comments):   Present on Admission: Yes    LABORATORY PANEL:  CBC Recent Labs  Lab 02/20/20 0330  WBC 5.7  HGB 7.4*  HCT 24.5*  PLT 29*    Chemistries  Recent Labs  Lab 02/17/20 0715 02/18/20 0700 02/20/20 0330  NA 158*   < > 158*  K 2.9*   < > 3.4*  CL 109   < > 116*  CO2 34*   < > 31  GLUCOSE 127*   < > 177*  BUN 74*   < > 61*  CREATININE 1.81*   < > 1.31*  CALCIUM 8.3*   < > 8.0*  MG 2.7*   < > 2.6*  AST 35  --   --   ALT 22  --   --   ALKPHOS 82  --   --   BILITOT 3.8*  --   --    < > = values in this interval not displayed.   Cardiac Enzymes No results for input(s): TROPONINI in the last 168 hours. RADIOLOGY:  DG Chest 1 View  Result Date: 02/20/2020 Bronson Ing, MD     02/20/2020 10:36 AM Endotracheal Intubation: Patient required placement of an artificial airway secondary to Respiratory Failure Consent: Emergent. Hand washing performed prior to starting the procedure. Medications administered for sedation prior to procedure: Midazolam 2 mg IV,  Etomidate 30 mg IV, Succinylcholine 160 mcg IV. A time out procedure was called and correct patient, name, & ID confirmed. Needed  supplies and equipment were assembled and checked to include ETT, 10 ml syringe, Glidescope, Mac and Miller blades, suction, oxygen and bag mask valve, end tidal CO2 monitor. Patient was positioned to align the mouth and pharynx to facilitate visualization of the glottis. Heart rate, SpO2 and blood pressure was continuously monitored during the procedure. Pre-oxygenation was conducted prior to intubation and endotracheal tube was placed through the vocal cords into the trachea.  The artificial airway was placed under direct visualization via glidescope route using a 8-0 ETT on the first attempt. ETT was secured at 26 cm mark at the teeth. Placement was confirmed by auscuitation of  lungs with good breath sounds bilaterally and no stomach sounds.  Condensation was noted on endotracheal tube.  Pulse ox 98%.  CO2 detector in place with appropriate color change. Complications: None . Operator: Nam. Chest radiograph ordered and pending. Comments: OGT placed via glidescope. Arcelia Jew MD intensivist   DG Abd 1 View  Result Date: 02/20/2020 Bronson Ing, MD     02/20/2020 10:36 AM Endotracheal Intubation: Patient required placement of an artificial airway secondary to Respiratory Failure Consent: Emergent. Hand washing performed prior to starting the procedure. Medications administered for sedation prior to procedure: Midazolam 2 mg IV,  Etomidate 30 mg IV, Succinylcholine 160 mcg IV. A time out procedure was called and correct patient, name, & ID confirmed. Needed supplies and equipment were assembled and checked to include ETT, 10 ml syringe, Glidescope, Mac and Miller blades, suction, oxygen and bag mask valve, end tidal CO2 monitor. Patient was positioned to align the mouth and pharynx to facilitate visualization of the glottis. Heart rate, SpO2 and blood pressure was continuously monitored during the procedure. Pre-oxygenation was conducted prior to intubation and endotracheal tube was placed through the vocal cords into the trachea.  The artificial airway was placed under direct visualization via glidescope route using a 8-0 ETT on the first attempt. ETT was secured at 26 cm mark at the teeth. Placement was confirmed by auscuitation of lungs with good breath sounds bilaterally and no stomach sounds.  Condensation was noted on endotracheal tube.  Pulse ox 98%.  CO2 detector in place with appropriate color change. Complications: None . Operator: Nam. Chest radiograph ordered and pending. Comments: OGT placed via glidescope. Arcelia Jew MD intensivist   ASSESSMENT AND PLAN:  Evan [name] is a 80 y.o. 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  He has had a mild cough that is chronic but no fever or shortness of breath  Septic shock, present on admission with a lactic acid up at 6.6.  Patient also had fever tachypnea, leukopenia and tachycardia.  Patient initially had hypotension, acute kidney injury, acute hypoxic respiratory failure and acute metabolic encephalopathy.   --Blood cultures positive for Enterococcus faecalis.  -- Patient on Rocephin and ampicillin.  Initial TTE negative for endocarditis.  Too sick for TEE at this point! -- Appreciate infectious disease input.  Acute on chronic hypoxic respiratory failure secondary to COVID-19 pneumonia. --  Patient on heated high flow nasal cannula at 98% FiO2 and  55 L. --continues to get desaturations and now quiet lethargic --Continue Solu-Medrol and remdesivir.  With septic shock not a great candidate for baricitinib. -- Chest x-ray continues to show bilateral pulmonary infiltrates. --d/w wife and Dr Nam--pt will be intubated today.   Acute metabolic encephalopathy secondary to septic shock and COVID-19 infection. --   Follows some commands but did not do well with the swallow evaluation.  Likely will not be able to keep up with nutritional needs.  Acute kidney injury. Hypernatremia/poor PO intake --  Baseline creatinine about 1.16.  Creatinine peaked at 2.05 and is now down to 1.53 with IV fluids.  Hypernatremia and hypokalemia.  Case discussed with pharmacist to change IV fluids to D5 W+ 20 KCl. --sodium remains unchanged despite  IVF--hoping NG feeding will help  Myelodysplastic syndrome with severe anemia.  Patient initially had a hemoglobin of 5.7 and received 3 units of packed red blood cells.  Current hemoglobin 7.9.  Hematochezia.  Holding Eliquis for now  Chronic systolic congestive heart failure.  Continue to monitor fluid status closely.  Chronic atrial fibrillation with history of TAVR and acquired thrombophilia.   --Holding Eliquis.  Stroke risk will be higher with holding Eliquis.  Type 2 diabetes mellitus on sliding scale  Elevated troponin likely demand ischemia  Stage I sacral decubiti, present on admission.  See full description below  Palliative care consultation.  Patient currently full code.  Nutrition.  per dietitian recommendation now that pt has NG placed Pressure Injury 02/16/20 Sacrum Mid Stage 1 -  Intact skin with non-blanchable redness of a localized area usually over a bony prominence. (Active)  02/16/20 1000  Location: Sacrum  Location Orientation: Mid  Staging: Stage 1 -  Intact skin with non-blanchable redness of a localized area usually over a bony prominence.  Wound Description (Comments):   Present on Admission: Yes     Procedures:ETT Family communication : wife in the ICU Consults : ID, PCCM CODE STATUS: full DVT Prophylaxis :scd Level of care: ICU Status is: Inpatient  Remains inpatient appropriate because:Inpatient level of care appropriate due to severity of illness   Dispo: The patient is from: Home              Anticipated d/c is to: TBD              Anticipated d/c date is: > 3 days              Patient currently is not medically stable to d/c.   patient is critically ill with multiple comorbid conditions. He is requiring significant amount of high flow nasal cannula oxygen. Overall poor prognosis.  Pt now intubated and on the vent.  Will transfer to ICU care       TOTAL TIME TAKING CARE OF THIS PATIENT: 35 minutes.  >50% time spent on  counselling and coordination of care  Note: This dictation was prepared with Dragon dictation along with smaller phrase technology. Any transcriptional errors that result from this process are unintentional.  Fritzi Mandes M.D    [facility]   CC: Primary care physician; Leonel Ramsay, MDPatient ID: Evan [name], male   DOB: [birth year], 80 y.o.   MRN: [id number]

## 2020-02-20 NOTE — Progress Notes (Signed)
SLP Cancellation Note  Patient Details Name: Evan [name] MRN: [id number] DOB: 01-18-1941   Cancelled treatment:       Reason Eval/Treat Not Completed: Patient not medically ready;Medical issues which prohibited therapy (chart reviewed; consulted NSG).   Pt had a decline in medical status and was orally intubated this morning. ST services will sign off at this time. MD to reconsult if pt is appropriate for a BSE in the future.      Orinda Kenner, MS, CCC-SLP Speech Language Pathologist Rehab Services [phone] [facility] 02/20/2020, 1:42 PM

## 2020-02-20 NOTE — Progress Notes (Signed)
Progress Note  Patient Name: Evan [name] Date of Encounter: 02/20/2020  [facility] Cardiologist: [name] [name], MD   Subjective   COVID-19 pneumonia positive TAVR EF 35 to 40% Increasing high flow nasal cannula requirements, developed encephalopathy, patient wanted to remain full code, Difficulty swallowing, anorexia Intubated this morning 10 AM for airway protection, hypoxia Remains in atrial fibrillation, rate relatively well controlled On broad-spectrum antibiotics Rocephin and ampicillin Blood cultures positive for Enterococcus facialis ID following, antibiotics minimum 6 weeks On remdesivir and steroids Hypertension from 11 AM onward, after anesthesia given for intubation  Inpatient Medications    Scheduled Meds: . allopurinol  100 mg Oral Daily  . Chlorhexidine Gluconate Cloth  6 each Topical Daily  . docusate sodium  100 mg Oral Daily  . feeding supplement (NEPRO CARB STEADY)  237 mL Oral TID BM  . insulin aspart  0-20 Units Subcutaneous Q4H  . methylPREDNISolone (SOLU-MEDROL) injection  40 mg Intravenous Q12H  . multivitamin with minerals  1 tablet Oral Daily  . pantoprazole (PROTONIX) IV  40 mg Intravenous Q24H  . tamsulosin  0.4 mg Oral Daily   Continuous Infusions: . sodium chloride    . ampicillin (OMNIPEN) IV 2 g (02/20/20 1244)  . cefTRIAXone (ROCEPHIN)  IV 2 g (02/20/20 1016)  . dextrose 5 % with KCl 20 mEq / L 20 mEq (02/20/20 1038)  . fentaNYL infusion INTRAVENOUS 50 mcg/hr (02/20/20 1115)  . norepinephrine (LEVOPHED) Adult infusion 2 mcg/min (02/20/20 1242)  . propofol (DIPRIVAN) infusion 20 mcg/kg/min (02/20/20 1115)   PRN Meds: acetaminophen **OR** acetaminophen, docusate sodium, ondansetron **OR** ondansetron (ZOFRAN) IV, polyethylene glycol   Vital Signs    Vitals:   02/20/20 0800 02/20/20 0933 02/20/20 1430 02/20/20 1445  BP: 133/78  98/62   Pulse: 73  65 65  Resp: (!) _0 Temp: (!) 96.6 F (35.9 C)  (!) 96 F (35.6 C)    TempSrc: Axillary  Axillary   SpO2: 95% 92% 100% 100%  Weight:      Height:        Intake/Output Summary (Last 24 hours) at 02/20/2020 1453 Last data filed at 02/20/2020 1244 Gross per 24 hour  Intake 2910.23 ml  Output 1500 ml  Net 1410.23 ml   Last 3 Weights 02/13/2020 02/04/2020 02/02/2020  Weight (lbs) 229 lb 4.5 oz 228 lb 13.4 oz 229 lb 15 oz  Weight (kg) 104 kg 103.8 kg 104.3 kg      Telemetry    Atrial fibrillation rate 70- Personally Reviewed  ECG     - Personally Reviewed  Physical Exam   GEN: No acute distress.   Neck: No JVD Cardiac: RRR, no murmurs, rubs, or gallops.  Respiratory: Clear to auscultation bilaterally. GI: Soft, nontender, non-distended  MS: No edema; No deformity. Neuro:  Nonfocal  Psych: Normal affect   Labs    High Sensitivity Troponin:   Recent Labs  Lab 01/30/2020 1907 02/16/20 0047  TROPONINIHS 210* 219*      Chemistry Recent Labs  Lab 01/31/2020 1907 02/16/20 0749 02/16/20 1525 02/17/20 0715 02/18/20 0700 02/19/20 0500 02/20/20 0330  NA 153*   < > 152* 158* 158* 158* 158*  K 3.6   < > 3.4* 2.9* 3.1* 3.1* 3.4*  CL 101   < > 105 109 112* 112* 116*  CO2 34*   < > 33* 34* 32 33* 31  GLUCOSE 213*   < > 296* 127* 145* 166* 177*  BUN 69*   < >  79* 74* 76* 75* 61*  CREATININE 1.70*   < > 2.05* 1.81* 1.53* 1.53* 1.31*  CALCIUM 8.7*   < > 8.1* 8.3* 8.3* 8.5* 8.0*  PROT 6.5  --  5.4* 5.8*  --   --   --   ALBUMIN 2.8*  --  2.3* 2.3*  --   --   --   AST 32  --  30 35  --   --   --   ALT 25  --  21 22  --   --   --   ALKPHOS 107  --  84 82  --   --   --   BILITOT 4.2*  --  3.9* 3.8*  --   --   --   GFRNONAA 41*   < > 33* 38* 46* 46* 56*  ANIONGAP 18*   < > _0 < > = values in this interval not displayed.     Hematology Recent Labs  Lab 02/18/20 0700 02/19/20 0500 02/20/20 0330  WBC 4.4 5.5 5.7  RBC 2.69* 2.71* 2.49*  HGB 7.9* 8.0* 7.4*  HCT 25.9* 26.7* 24.5*  MCV 96.3 98.5 98.4  MCH 29.4 29.5 29.7  MCHC  30.5 30.0 30.2  RDW 25.6* 25.1* 24.6*  PLT 31* 30* 29*    BNP Recent Labs  Lab 02/16/2020 1907  BNP 1,999.6*     DDimer No results for input(s): DDIMER in the last 168 hours.   Radiology    DG Chest 1 View  Result Date: 02/20/2020 Bronson Ing, MD     02/20/2020 10:36 AM Endotracheal Intubation: Patient required placement of an artificial airway secondary to Respiratory Failure Consent: Emergent. Hand washing performed prior to starting the procedure. Medications administered for sedation prior to procedure: Midazolam 2 mg IV,  Etomidate 30 mg IV, Succinylcholine 160 mcg IV. A time out procedure was called and correct patient, name, & ID confirmed. Needed supplies and equipment were assembled and checked to include ETT, 10 ml syringe, Glidescope, Mac and Miller blades, suction, oxygen and bag mask valve, end tidal CO2 monitor. Patient was positioned to align the mouth and pharynx to facilitate visualization of the glottis. Heart rate, SpO2 and blood pressure was continuously monitored during the procedure. Pre-oxygenation was conducted prior to intubation and endotracheal tube was placed through the vocal cords into the trachea.  The artificial airway was placed under direct visualization via glidescope route using a 8-0 ETT on the first attempt. ETT was secured at 26 cm mark at the teeth. Placement was confirmed by auscuitation of lungs with good breath sounds bilaterally and no stomach sounds.  Condensation was noted on endotracheal tube.  Pulse ox 98%.  CO2 detector in place with appropriate color change. Complications: None . Operator: Nam. Chest radiograph ordered and pending. Comments: OGT placed via glidescope. Arcelia Jew MD intensivist   DG Abd 1 View  Result Date: 02/20/2020 Bronson Ing, MD     02/20/2020 10:36 AM Endotracheal Intubation: Patient required placement of an artificial airway secondary to Respiratory Failure Consent: Emergent. Hand washing performed prior to starting the  procedure. Medications administered for sedation prior to procedure: Midazolam 2 mg IV,  Etomidate 30 mg IV, Succinylcholine 160 mcg IV. A time out procedure was called and correct patient, name, & ID confirmed. Needed supplies and equipment were assembled and checked to include ETT, 10 ml syringe, Glidescope, Mac and Miller blades, suction, oxygen and bag mask valve, end tidal  CO2 monitor. Patient was positioned to align the mouth and pharynx to facilitate visualization of the glottis. Heart rate, SpO2 and blood pressure was continuously monitored during the procedure. Pre-oxygenation was conducted prior to intubation and endotracheal tube was placed through the vocal cords into the trachea.  The artificial airway was placed under direct visualization via glidescope route using a 8-0 ETT on the first attempt. ETT was secured at 26 cm mark at the teeth. Placement was confirmed by auscuitation of lungs with good breath sounds bilaterally and no stomach sounds.  Condensation was noted on endotracheal tube.  Pulse ox 98%.  CO2 detector in place with appropriate color change. Complications: None . Operator: Nam. Chest radiograph ordered and pending. Comments: OGT placed via glidescope. Arcelia Jew MD intensivist    Cardiac Studies     Patient Profile     80 y.o. male history of CAD/CABG 2005, AS/TAVR 2019, pacemaker, permanent A. fib presenting with weakness, confusion, fall oral intake.  Diagnosed with COVID-19 pneumonia and bacteremia.  Echocardiogram revealing moderately reduced EF, 35 to 40%.  Assessment & Plan     COVID-19 pneumonia Now intubated On remdesivir, steroids, broad-spectrum antibiotics  Bacteremia, Enterococcus facialis On broad-spectrum antibiotics, TEE on hold, may need to be done given he has prosthetic valve but will wait until stable, currently hypotensive  Atrial fibrillation, persistent Anticoagulation on hold secondary to anemia, thrombocytopenia High bleed risk, platelets  29 Rate relatively well controlled, on no medications  Cardiomyopathy Presumed to be ischemic given known coronary disease, prior bypass 2005 No plan for ischemic work-up at this time  Anemia/thrombocytopenia In the setting of bacteremia, COVID Hematochezia Eliquis on hold Also with MDS Already received 3 units packed red blood cells Any signs of bleeding may need platelets   Critically ill gentleman as detailed above, long discussion with nursing, family at bedside  Total encounter time more than 35 minutes  Greater than 50% was spent in counseling and coordination of care with the patient   For questions or updates, please contact [facility] Please consult [email or web address] for contact info under        Signed, Ida Rogue, MD  02/20/2020, 2:53 PM

## 2020-02-20 NOTE — Progress Notes (Signed)
ID Patient got intubated this morning for hypoxia and airway protection.  On examination intubated Sedated BP 106/65   Pulse 64   Temp (!) 96.3 F (35.7 C) (Axillary)   Resp (!) 27   Ht 6' (1.829 m)   Wt 104 kg   SpO2 99%   BMI 31.10 kg/m   Chest bilateral air entry Heart sound atrial fibrillation rate controlled Pacemaker felt Abdomen soft  .labs CBC Latest Ref Rng & Units 02/20/2020 02/20/2020 02/19/2020  WBC 4.0 - 10.5 K/uL - 5.7 5.5  Hemoglobin 13.0 - 17.0 g/dL 8.3(L) 7.4(L) 8.0(L)  Hematocrit 39.0 - 52.0 % 26.3(L) 24.5(L) 26.7(L)  Platelets 150 - 400 K/uL - 29(LL) 30(L)    CMP Latest Ref Rng & Units 02/20/2020 02/19/2020 02/18/2020  Glucose 70 - 99 mg/dL 177(H) 166(H) 145(H)  BUN 8 - 23 mg/dL 61(H) 75(H) 76(H)  Creatinine 0.61 - 1.24 mg/dL 1.31(H) 1.53(H) 1.53(H)  Sodium 135 - 145 mmol/L 158(H) 158(H) 158(H)  Potassium 3.5 - 5.1 mmol/L 3.4(L) 3.1(L) 3.1(L)  Chloride 98 - 111 mmol/L 116(H) 112(H) 112(H)  CO2 22 - 32 mmol/L 31 33(H) 32  Calcium 8.9 - 10.3 mg/dL 8.0(L) 8.5(L) 8.3(L)  Total Protein 6.5 - 8.1 g/dL - - -  Total Bilirubin 0.3 - 1.2 mg/dL - - -  Alkaline Phos 38 - 126 U/L - - -  AST 15 - 41 U/L - - -  ALT 0 - 44 U/L - - -    Microbiology: Micro 02/16/2020 blood culture Enterococcus faecalis 02/17/2020 blood cultures NG 02/17/2020 urine culture no growth   Impression/recommendation  Acute hypoxic respiratory failure -- intubated   Enterococcus bacteremia 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 He has severely reduced left ventricular function and global hypokinesis of left ventricle. He needs TEE but currently very ill to undergo the procedure. Continue ampicillin and ceftriaxone. Need a minimum of 6 weeks. When able we will get a TEE.  Covid pneumonia with acute hypoxic respiratory failure. Received remdesivir and is on steroids.  A. fib rate controlled  Myelodysplastic syndrome with anemia, leukopenia and thrombocytopenia. Received blood  transfusion  CKD  Cirrhosis of the liver  Patient is critically ill and prognosis guarded. Discussed the management with the care team.

## 2020-02-20 NOTE — Procedures (Signed)
Endotracheal Intubation: Patient required placement of an artificial airway secondary to Respiratory Failure  Consent: Emergent.   Hand washing performed prior to starting the procedure.   Medications administered for sedation prior to procedure:  Midazolam 2 mg IV,  Etomidate 30 mg IV, Succinylcholine 160 mcg IV.   A time out procedure was called and correct patient, name, & ID confirmed. Needed supplies and equipment were assembled and checked to include ETT, 10 ml syringe, Glidescope, Mac and Miller blades, suction, oxygen and bag mask valve, end tidal CO2 monitor.   Patient was positioned to align the mouth and pharynx to facilitate visualization of the glottis.   Heart rate, SpO2 and blood pressure was continuously monitored during the procedure. Pre-oxygenation was conducted prior to intubation and endotracheal tube was placed through the vocal cords into the trachea.     The artificial airway was placed under direct visualization via glidescope route using a 8-0 ETT on the first attempt.  ETT was secured at 26 cm mark at the teeth.  Placement was confirmed by auscuitation of lungs with good breath sounds bilaterally and no stomach sounds.  Condensation was noted on endotracheal tube.   Pulse ox 98%.  CO2 detector in place with appropriate color change.   Complications: None .   Operator: Tomeshia Pizzi.   Chest radiograph ordered and pending.   Comments: OGT placed via glidescope.  Arcelia Jew MD intensivist

## 2020-02-20 NOTE — Progress Notes (Signed)
Patient successfully intubated with a # 8.0 oral ET tube on first attempt with glide scope. Color change noted on capnometer, bilateral breath sounds auscultated by MD, and chest rise noted. Post intubation CXR ordered. Tube secured with commercial tube holder at 26 cm at the lip.

## 2020-02-20 NOTE — Progress Notes (Signed)
Palliative: Mr. Fails has worsened overnight.  He was intubated this morning.  His wife was at bedside and had discussions with critical care MD related to [place] and life support prior to intubation.  Mrs. Barna and I speak about Mr. Lachney intubation/life support.  She asks appropriate questions.  We talked about looking for meaningful improvement, and how this would be exhibited on ventilator settings.  Mrs. Goggins asks about what would happen if they decided to remove the breathing tube.  We talked about compassionate extubation, what this would look like and feel like.  We also talked about what long-term ventilator support looks like, the need for LTAC.  Conference with CCM during rounds. PMT to follow.  Plan: Time for outcomes.  Anticipate no decisions made until after the weekend.  25 minutes  Quinn Axe, NP Palliative medicine team Team phone [phone] Greater than 50% of this time was spent counseling and coordinating care related to the above assessment and plan.

## 2020-02-20 NOTE — Progress Notes (Signed)
Nutrition Follow Up Note   DOCUMENTATION CODES:   Obesity unspecified  INTERVENTION:   Once pt is stable for tube feeds, recommend:  Vital 1.2 '@60ml' /hr- Initiate at 14m/hr and increase by 147mhr q 8 hours until goal rate is reached.   Pro-Source 4570m5 times daily via tube  Free water flushes 1m19m hours to maintain tube patency   Regimen provides 1928kcal/day, 163g/day protein and 1348ml47m free water.   Pt at high refeed risk; recommend monitor potassium, magnesium and phosphorus labs daily until stable  NUTRITION DIAGNOSIS:   Increased nutrient needs related to catabolic illness (COVID 19, CHF, Myelodysplastic syndrome, cirrhosis) as evidenced by estimated needs.  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 OGT in place; awaiting x-ray confirmation. Plan is to hold tube feeds for today and possibly start tomorrow. Prior to intubation, pt declined any nasogastric or G-tube placement. Pt is at high refeed risk. No new weight since admit; will request daily weights.    Medications reviewed and include: allopurinol, colace, insulin, solu-medrol, MVI, protonix, omnipen, ceftriaxone, 5% dextrose w/ KCl '@100ml' /hr, fentanyl, propofol  Labs reviewed: Na 158(H), K 3.4(L), BUN 61(H), creat 1.31(H), P 3.2 wnl, Mg  2.6(H) Hgb 7.4(L), Hct 24.5(L) cbgs- 151, 154, 141 x 24hrs  Patient is currently intubated on ventilator support Temp (24hrs), Avg:97.3 F (36.3 C), Min:96.6 F (35.9 C), Max:97.9 F (36.6 C)  Propofol: 12.48 ml/hr- provides 329kcal/day   MAP- >65mmH80mOP- 1325ml  31m Order:   Diet Order            Diet NPO time specified  Diet effective now                EDUCATION NEEDS:   No education needs have been identified at this time  Skin:  Skin Assessment: Reviewed RN Assessment (ecchymosis, Stage I sacrum)  Last BM:  PTA  Height:   Ht Readings from Last 1 Encounters:  02/08/2020 6' (1.829 m)    Weight:   Wt Readings from Last 1 Encounters:  02/22/2020 104 kg    Ideal Body Weight:  80.9 kg  BMI:  Body mass index is 31.1 kg/m.  Estimated Nutritional Needs:   Kcal:  1800kcal/day  Protein:  >160g/day  Fluid:  2.0L/day  Monick Rena CKoleen Distance, LDN Please [facility] and/or RD on-call/weekend/after hours pager

## 2020-02-20 NOTE — Progress Notes (Signed)
Patient ID: Evan [name], male   DOB: Feb 05, 1940, 80 y.o.   MRN: [id number]  D/w Mrs Vines along with Dr Gwendalyn Ege Mr Pickel seems not improving and declining overall with encephalopathy/hypoxia and unable to maintain airway. Difficult swallowing and high risk for aspiration. multiple co-morbidities adding to overall poor prognosis. Mrs Shauna Hugh understands overall prognosis. She is Ok with mech ventilator for now.  Pt will be transferred under ICU attending Dr Gwendalyn Ege.

## 2020-02-21 ENCOUNTER — Ambulatory Visit: Payer: Medicare Other | Admitting: Family

## 2020-02-21 DIAGNOSIS — D696 Thrombocytopenia, unspecified: Secondary | ICD-10-CM

## 2020-02-21 DIAGNOSIS — B952 Enterococcus as the cause of diseases classified elsewhere: Secondary | ICD-10-CM | POA: Diagnosis not present

## 2020-02-21 DIAGNOSIS — K7030 Alcoholic cirrhosis of liver without ascites: Secondary | ICD-10-CM | POA: Diagnosis not present

## 2020-02-21 DIAGNOSIS — J9602 Acute respiratory failure with hypercapnia: Secondary | ICD-10-CM

## 2020-02-21 DIAGNOSIS — E119 Type 2 diabetes mellitus without complications: Secondary | ICD-10-CM

## 2020-02-21 DIAGNOSIS — R7881 Bacteremia: Secondary | ICD-10-CM | POA: Diagnosis not present

## 2020-02-21 DIAGNOSIS — J9601 Acute respiratory failure with hypoxia: Secondary | ICD-10-CM | POA: Diagnosis not present

## 2020-02-21 DIAGNOSIS — I255 Ischemic cardiomyopathy: Secondary | ICD-10-CM

## 2020-02-21 DIAGNOSIS — U071 COVID-19: Secondary | ICD-10-CM | POA: Diagnosis not present

## 2020-02-21 DIAGNOSIS — G9341 Metabolic encephalopathy: Secondary | ICD-10-CM | POA: Diagnosis not present

## 2020-02-21 DIAGNOSIS — I25708 Atherosclerosis of coronary artery bypass graft(s), unspecified, with other forms of angina pectoris: Secondary | ICD-10-CM | POA: Diagnosis not present

## 2020-02-21 DIAGNOSIS — J9600 Acute respiratory failure, unspecified whether with hypoxia or hypercapnia: Secondary | ICD-10-CM | POA: Diagnosis not present

## 2020-02-21 LAB — BPAM RBC
Blood Product Expiration Date: 202202262359
ISSUE DATE / TIME: 202201271438
Unit Type and Rh: 6200

## 2020-02-21 LAB — TYPE AND SCREEN
ABO/RH(D): A POS
Antibody Screen: NEGATIVE
Unit division: 0

## 2020-02-21 LAB — BASIC METABOLIC PANEL
Anion gap: 9 (ref 5–15)
Anion gap: 10 (ref 5–15)
BUN: 62 mg/dL — ABNORMAL HIGH (ref 8–23)
BUN: 63 mg/dL — ABNORMAL HIGH (ref 8–23)
CO2: 31 mmol/L (ref 22–32)
CO2: 28 mmol/L (ref 22–32)
Calcium: 7.8 mg/dL — ABNORMAL LOW (ref 8.9–10.3)
Calcium: 7.8 mg/dL — ABNORMAL LOW (ref 8.9–10.3)
Chloride: 112 mmol/L — ABNORMAL HIGH (ref 98–111)
Chloride: 109 mmol/L (ref 98–111)
Creatinine, Ser: 1.4 mg/dL — ABNORMAL HIGH (ref 0.61–1.24)
Creatinine, Ser: 1.49 mg/dL — ABNORMAL HIGH (ref 0.61–1.24)
GFR, Estimated: 51 mL/min — ABNORMAL LOW (ref >60)
GFR, Estimated: 48 mL/min — ABNORMAL LOW (ref >60)
Glucose, Bld: 253 mg/dL — ABNORMAL HIGH (ref 70–99)
Glucose, Bld: 161 mg/dL — ABNORMAL HIGH (ref 70–99)
Potassium: 3.9 mmol/L (ref 3.5–5.1)
Potassium: 4.1 mmol/L (ref 3.5–5.1)
Sodium: 152 mmol/L — ABNORMAL HIGH (ref 135–145)
Sodium: 147 mmol/L — ABNORMAL HIGH (ref 135–145)

## 2020-02-21 LAB — GLUCOSE, CAPILLARY
Glucose-Capillary: 139 mg/dL — ABNORMAL HIGH (ref 70–99)
Glucose-Capillary: 144 mg/dL — ABNORMAL HIGH (ref 70–99)
Glucose-Capillary: 154 mg/dL — ABNORMAL HIGH (ref 70–99)
Glucose-Capillary: 196 mg/dL — ABNORMAL HIGH (ref 70–99)
Glucose-Capillary: 197 mg/dL — ABNORMAL HIGH (ref 70–99)
Glucose-Capillary: 209 mg/dL — ABNORMAL HIGH (ref 70–99)

## 2020-02-21 LAB — CULTURE, BLOOD (ROUTINE X 2)

## 2020-02-21 LAB — MAGNESIUM
Magnesium: 2.6 mg/dL — ABNORMAL HIGH (ref 1.7–2.4)
Magnesium: 2.4 mg/dL (ref 1.7–2.4)

## 2020-02-21 LAB — C-REACTIVE PROTEIN: CRP: 5.3 mg/dL — ABNORMAL HIGH (ref <1.0)

## 2020-02-21 LAB — CBC
HCT: 27.2 % — ABNORMAL LOW (ref 39.0–52.0)
Hemoglobin: 8.4 g/dL — ABNORMAL LOW (ref 13.0–17.0)
MCH: 29.6 pg (ref 26.0–34.0)
MCHC: 30.9 g/dL (ref 30.0–36.0)
MCV: 95.8 fL (ref 80.0–100.0)
Platelets: 32 10*3/uL — ABNORMAL LOW (ref 150–400)
RBC: 2.84 MIL/uL — ABNORMAL LOW (ref 4.22–5.81)
RDW: 23.5 % — ABNORMAL HIGH (ref 11.5–15.5)
WBC: 6.9 10*3/uL (ref 4.0–10.5)
nRBC: 6.4 % — ABNORMAL HIGH (ref 0.0–0.2)

## 2020-02-21 LAB — TRIGLYCERIDES: Triglycerides: 312 mg/dL — ABNORMAL HIGH (ref <150)

## 2020-02-21 LAB — PATHOLOGIST SMEAR REVIEW

## 2020-02-21 LAB — PHOSPHORUS
Phosphorus: 2.6 mg/dL (ref 2.5–4.6)
Phosphorus: 3.2 mg/dL (ref 2.5–4.6)

## 2020-02-21 LAB — FERRITIN: Ferritin: 1136 ng/mL — ABNORMAL HIGH (ref 24–336)

## 2020-02-21 LAB — FIBRIN DERIVATIVES D-DIMER (ARMC ONLY): Fibrin derivatives D-dimer (ARMC): 753.14 ng/mL (FEU) — ABNORMAL HIGH (ref 0.00–499.00)

## 2020-02-21 MED ORDER — SODIUM CHLORIDE 0.45 % IV SOLN: INTRAVENOUS | Status: DC

## 2020-02-21 MED ORDER — PIVOT 1.5 CAL PO LIQD
1000.0000 mL | ORAL | Status: DC
  Filled 2020-02-21: qty 1000

## 2020-02-21 MED ORDER — PROSOURCE TF PO LIQD
45.0000 mL | Freq: Two times a day (BID) | ORAL | Status: DC
  Filled 2020-02-21: qty 45

## 2020-02-21 MED ORDER — ORAL CARE MOUTH RINSE
15.0000 mL | OROMUCOSAL | Status: DC
  Administered 2020-02-21 – 2020-02-27 (×55): 15 mL via OROMUCOSAL

## 2020-02-21 MED ORDER — VITAL AF 1.2 CAL PO LIQD
1000.0000 mL | ORAL | Status: DC
  Administered 2020-02-21 – 2020-02-23 (×3): 1000 mL

## 2020-02-21 MED ORDER — PROSOURCE TF PO LIQD
90.0000 mL | Freq: Four times a day (QID) | ORAL | Status: DC
  Administered 2020-02-22 – 2020-02-25 (×13): 90 mL
  Filled 2020-02-21: qty 90

## 2020-02-21 MED ORDER — FREE WATER
250.0000 mL | Freq: Four times a day (QID) | Status: DC
  Administered 2020-02-21 (×2): 250 mL

## 2020-02-21 MED ORDER — CHLORHEXIDINE GLUCONATE 0.12% ORAL RINSE (MEDLINE KIT)
15.0000 mL | Freq: Two times a day (BID) | OROMUCOSAL | Status: DC
  Administered 2020-02-21 – 2020-02-26 (×11): 15 mL via OROMUCOSAL

## 2020-02-21 NOTE — Consult Note (Addendum)
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 He was admitted with acute respiratory failure secondary to COVID pneumonia, AKI which has improved, and enterococcal bacteremia. Patient was intubated on 1/27 and placed on vent. Pharmacy has been consulted for electrolyte monitoring and replacement.   Nutrition: When stable for tube feed per RD: Vital 24mL/hr + Prosource 13mL 5 times daily + Free water flushes 20mL q4h  MIVF: D5W KCl 20 mEq / L @ 169mL/hr   Goal of Therapy:  Electrolytes WNL   Plan:  Na 152 (improving), K 3.9 (improving), Continue D5W w/ KCL 20 mEq/L @100mL /hr (2.4L/day, 48 mEq K)  Check electrolytes in am.  Pharmacy will continue to monitor and evaluate electrolytes.      Emogene Morgan, Pharmacy Student  02/21/2020

## 2020-02-21 NOTE — Progress Notes (Signed)
CRITICAL CARE NOTE  SYNOPSIS  80 year old male presenting to the ED with poor p.o. take confusion and generalized weakness to the point of being unable to ambulate.  COVID 19 + sCHF+Liver cirrhosis  ED course: On initial vitals patient was afebrile, BP 119/76, HR 66, tachypneic in the 20's with SPO2 at 100% on 4 L nasal cannula.  Labs significant for: Hemoglobin 5.7 (was 8.1- 11 days prior), platelets 46,000 (reduced from 69,000), sodium 153, creatinine 1.77 (prior 1.16), ammonia 38, troponin 210, BNP 2000, COVID PCR positive.  CT head showed no acute abnormalities.  Patient was started on a blood transfusion 2 units PRBCs.  Hospitalist service was consulted for admission. Patient became hypoxic requiring 6 L nasal cannula.  BP also dropped with pressures in the high 62U low 63F systolically maps 35-45.  Lactic increased from 2.1-6.6.  Sepsis criteria initiated, PCCM consulted for further management and monitoring due to worsening respiratory status and possible impending shock.  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 Down to minimal vent settings today. Unable to perform TEE yesterday due to worsened clinical status. Palliative is following patient.   BP 107/68   Pulse 67   Temp (!) 97 F (36.1 C) (Axillary)   Resp 18   Ht 6' (1.829 m)   Wt 104 kg   SpO2 100%   BMI 31.10 kg/m    I/O last 3 completed shifts: In: 5801.7 [I.V.:4430.2; Blood:347.1; IV Piggyback:1024.5] Out: 1530 [Urine:1530] No intake/output data recorded.  SpO2: 100 % O2 Flow Rate (L/min): 55 L/min FiO2 (%): 35 %  Estimated body mass index is 31.1 kg/m as calculated from the following:   Height as of this encounter: 6' (1.829 m).   Weight as of this encounter: 104 kg.  REVIEW OF SYSTEMS Unable to provide ROS due to medical condition of intubation.  Pressure Injury 02/16/20 Sacrum Mid Stage 1 -  Intact skin with non-blanchable redness of a localized area usually over a bony prominence. (Active)  02/16/20 1000  Location: Sacrum  Location Orientation: Mid  Staging: Stage 1 -  Intact skin with non-blanchable redness of a localized area usually over a bony prominence.  Wound Description (Comments):   Present on Admission: Yes   PHYSICAL EXAMINATION:  GENERAL: intubated male in NAD HEAD: Normocephalic, atraumatic.  EYES: Pupils pinpoint, reactive PULMONARY: CTA b/l, no W/C?R CARDIOVASCULAR: S1 and S2. Regular rate and rhythm. No murmurs, rubs, or gallops.  GASTROINTESTINAL: Soft, nontender, nondistended.  Positive bowel sounds.   MUSCULOSKELETAL: No swelling, clubbing, or edema.  NEUROLOGIC: RASS -4, not following commands SKIN:intact,warm,dry  MEDICATIONS: I have reviewed all medications and confirmed regimen as documented   CULTURE RESULTS   Recent Results (from  the past 240 hour(s))  SARS Coronavirus 2 by RT PCR (hospital order, performed in [facility] hospital lab) Nasopharyngeal Nasopharyngeal Swab     Status:  Abnormal   Collection Time: 01/29/2020  7:07 PM   Specimen: Nasopharyngeal Swab  Result Value Ref Range Status   SARS Coronavirus 2 POSITIVE (A) NEGATIVE Final    Comment: RESULT CALLED TO, READ BACK BY AND VERIFIED WITH: CHRISTINE HALLAS AT 2050 02/04/2020. MF (NOTE) SARS-CoV-2 target nucleic acids are DETECTED  SARS-CoV-2 RNA is generally detectable in upper respiratory specimens  during the acute phase of infection.  Positive results are indicative  of the presence of the identified virus, but do not rule out bacterial infection or co-infection with other pathogens not detected by the test.  Clinical correlation with patient history and  other diagnostic information is necessary to determine patient infection status.  The expected result is negative.  Fact Sheet for Patients:   StrictlyIdeas.no   Fact Sheet for Healthcare Providers:   BankingDealers.co.za    This test is not yet approved or cleared by the [place] FDA and  has been authorized for detection and/or diagnosis of SARS-CoV-2 by FDA under an Emergency Use Authorization (EUA).  This EUA will remain in effect (meaning this  test can be used) for the duration of  the COVID-19 declaration under Section 564(b)(1) of the Act, 21 U.S.C. section 360-bbb-3(b)(1), unless the authorization is terminated or revoked sooner.  Performed at [facility], [address] [place], [place] [address]   CULTURE, BLOOD (ROUTINE X 2) w Reflex to ID Panel     Status: Abnormal (Preliminary result)   Collection Time: 02/16/20  2:11 AM   Specimen: BLOOD  Result Value Ref Range Status   Specimen Description   Final    BLOOD LEFT ANTECUBITAL Performed at [facility], [address], [place], [place] [address]    Special Requests   Final    BOTTLES DRAWN AEROBIC AND ANAEROBIC Blood Culture results may not be optimal due to an excessive volume of blood received in culture  bottles Performed at [facility], [address] [place], [place] [address]    Culture  Setup Time   Final    IN BOTH AEROBIC AND ANAEROBIC BOTTLES GRAM POSITIVE COCCI CRITICAL VALUE NOTED.  VALUE IS CONSISTENT WITH PREVIOUSLY REPORTED AND CALLED VALUE. Performed at [facility], [address], [place], [place] [address]    Culture (A)  Final    ENTEROCOCCUS FAECALIS SUSCEPTIBILITIES PERFORMED ON PREVIOUS CULTURE WITHIN THE LAST 5 DAYS. CULTURE REINCUBATED FOR BETTER GROWTH Performed at [facility], [place] [address] [place], [place] [address]    Report Status PENDING  Incomplete  CULTURE, BLOOD (ROUTINE X 2) w Reflex to ID Panel     Status: Abnormal   Collection Time: 02/16/20  2:11 AM   Specimen: BLOOD  Result Value Ref Range Status   Specimen Description   Final    BLOOD BLOOD RIGHT FOREARM Performed at [facility], [address] [place], [place] [address]    Special Requests   Final    BOTTLES DRAWN AEROBIC AND ANAEROBIC Blood Culture adequate volume Performed at [facility], [address] [place], [place] [address]    Culture  Setup Time   Final    IN BOTH AEROBIC AND ANAEROBIC BOTTLES GRAM POSITIVE COCCI CRITICAL RESULT CALLED TO, READ BACK BY AND VERIFIED WITH: BRANDON BEERS 02/16/20 AT 1229 BY ACR Performed at [facility], [place] [address], [place], [place] [address]    Culture ENTEROCOCCUS FAECALIS (A)  Final   Report Status 02/18/2020 FINAL  Final   Organism ID, Bacteria ENTEROCOCCUS FAECALIS  Final      Susceptibility   Enterococcus faecalis - MIC*    AMPICILLIN <=2 SENSITIVE Sensitive     VANCOMYCIN 2 SENSITIVE Sensitive     GENTAMICIN SYNERGY SENSITIVE Sensitive     * ENTEROCOCCUS FAECALIS  Blood Culture ID Panel (Reflexed)     Status: Abnormal   Collection Time: 02/16/20  2:11 AM  Result Value Ref Range Status   Enterococcus faecalis DETECTED (A) NOT DETECTED Final    Comment: CRITICAL RESULT  CALLED TO, READ BACK BY AND VERIFIED WITH: BRANDON BEERS 02/16/20 AT 1229 BY ACR    Enterococcus Faecium NOT DETECTED NOT DETECTED Final   Listeria monocytogenes NOT DETECTED NOT DETECTED Final   Staphylococcus species NOT DETECTED NOT DETECTED Final   Staphylococcus aureus (BCID) NOT DETECTED NOT DETECTED Final   Staphylococcus epidermidis NOT DETECTED NOT DETECTED Final   Staphylococcus lugdunensis NOT DETECTED NOT DETECTED Final   Streptococcus species NOT DETECTED NOT DETECTED Final   Streptococcus agalactiae NOT DETECTED NOT DETECTED Final   Streptococcus pneumoniae NOT DETECTED NOT DETECTED Final   Streptococcus pyogenes NOT DETECTED NOT DETECTED Final   A.calcoaceticus-baumannii NOT DETECTED NOT DETECTED Final   Bacteroides fragilis NOT DETECTED NOT DETECTED Final   Enterobacterales NOT DETECTED NOT DETECTED Final   Enterobacter cloacae complex NOT DETECTED NOT DETECTED Final   Escherichia coli NOT DETECTED NOT DETECTED Final   Klebsiella aerogenes NOT DETECTED NOT DETECTED Final   Klebsiella oxytoca NOT DETECTED NOT DETECTED Final   Klebsiella pneumoniae NOT DETECTED NOT DETECTED Final   Proteus species NOT DETECTED NOT DETECTED Final   Salmonella species NOT DETECTED NOT DETECTED Final   Serratia marcescens NOT DETECTED NOT DETECTED Final   Haemophilus influenzae NOT DETECTED NOT DETECTED Final   Neisseria meningitidis NOT DETECTED NOT DETECTED Final   Pseudomonas aeruginosa NOT DETECTED NOT DETECTED Final   Stenotrophomonas maltophilia NOT DETECTED NOT DETECTED Final   Candida albicans NOT DETECTED NOT DETECTED Final   Candida auris NOT DETECTED NOT DETECTED Final   Candida glabrata NOT DETECTED NOT DETECTED Final   Candida krusei NOT DETECTED NOT DETECTED Final   Candida parapsilosis NOT DETECTED NOT DETECTED Final   Candida tropicalis NOT DETECTED NOT DETECTED Final   Cryptococcus neoformans/gattii NOT DETECTED NOT DETECTED Final   Vancomycin resistance NOT DETECTED NOT  DETECTED Final    Comment: Performed at [facility], [address] [place], [place] [address]  CULTURE, BLOOD (ROUTINE X 2) w Reflex to ID Panel     Status: None (Preliminary result)   Collection Time: 02/17/20  2:16 AM   Specimen: BLOOD  Result Value Ref Range Status   Specimen Description BLOOD BLOOD LEFT HAND  Final   Special Requests   Final    BOTTLES DRAWN AEROBIC AND ANAEROBIC Blood Culture adequate volume   Culture   Final    NO GROWTH 4 DAYS Performed at [facility], [address] [place], [place] [address]    Report Status PENDING  Incomplete  CULTURE, BLOOD (ROUTINE X 2) w Reflex to ID Panel     Status: None (Preliminary result)   Collection Time: 02/17/20  2:16 AM   Specimen: BLOOD  Result Value Ref Range Status   Specimen Description BLOOD BLOOD RIGHT HAND  Final   Special Requests  Final    BOTTLES DRAWN AEROBIC AND ANAEROBIC Blood Culture adequate volume   Culture   Final    NO GROWTH 4 DAYS Performed at [facility], [address] [place], [place] [address]    Report Status PENDING  Incomplete  Urine Culture     Status: None   Collection Time: 02/17/20  7:15 AM   Specimen: Urine, Random  Result Value Ref Range Status   Specimen Description   Final    URINE, RANDOM Performed at [facility], [address], [place], [place] [address]    Special Requests   Final    NONE Performed at [facility], [address] [place], [place] [address]    Culture   Final    NO GROWTH Performed at [facility], [place] [address], [place], [place] [address]    Report Status 02/18/2020 FINAL  Final  MRSA PCR Screening     Status: None   Collection Time: 02/17/20  7:15 AM  Result Value Ref Range Status   MRSA by PCR NEGATIVE NEGATIVE Final    Comment:        The GeneXpert MRSA Assay (FDA approved for NASAL specimens only), is one component of a comprehensive MRSA colonization surveillance program. It is  not intended to diagnose MRSA infection nor to guide or monitor treatment for MRSA infections. Performed at [facility], [address], [place],  [address]       IMAGING   DG Chest 1 View  Result Date: 02/20/2020 Bronson Ing, MD     02/20/2020 10:36 AM Endotracheal Intubation: Patient required placement of an artificial airway secondary to Respiratory Failure Consent: Emergent. Hand washing performed prior to starting the procedure. Medications administered for sedation prior to procedure: Midazolam 2 mg IV,  Etomidate 30 mg IV, Succinylcholine 160 mcg IV. A time out procedure was called and correct patient, name, & ID confirmed. Needed supplies and equipment were assembled and checked to include ETT, 10 ml syringe, Glidescope, Mac and Miller blades, suction, oxygen and bag mask valve, end tidal CO2 monitor. Patient was positioned to align the mouth and pharynx to facilitate visualization of the glottis. Heart rate, SpO2 and blood pressure was continuously monitored during the procedure. Pre-oxygenation was conducted prior to intubation and endotracheal tube was placed through the vocal cords into the trachea.  The artificial airway was placed under direct visualization via glidescope route using a 8-0 ETT on the first attempt. ETT was secured at 26 cm mark at the teeth. Placement was confirmed by auscuitation of lungs with good breath sounds bilaterally and no stomach sounds.  Condensation was noted on endotracheal tube.  Pulse ox 98%.  CO2 detector in place with appropriate color change. Complications: None . Operator: Nam. Chest radiograph ordered and pending. Comments: OGT placed via glidescope. Arcelia Jew MD intensivist   DG Abd 1 View  Result Date: 02/20/2020 Bronson Ing, MD     02/20/2020 10:36 AM Endotracheal Intubation: Patient required placement of an artificial airway secondary to Respiratory Failure Consent: Emergent. Hand washing performed prior to starting the  procedure. Medications administered for sedation prior to procedure: Midazolam 2 mg IV,  Etomidate 30 mg IV, Succinylcholine 160 mcg IV. A time out procedure was called and correct patient, name, & ID confirmed. Needed supplies and equipment were assembled and checked to include ETT, 10 ml syringe, Glidescope, Mac and Miller blades, suction, oxygen and bag mask valve, end tidal CO2 monitor. Patient  was positioned to align the mouth and pharynx to facilitate visualization of the glottis. Heart rate, SpO2 and blood pressure was continuously monitored during the procedure. Pre-oxygenation was conducted prior to intubation and endotracheal tube was placed through the vocal cords into the trachea.  The artificial airway was placed under direct visualization via glidescope route using a 8-0 ETT on the first attempt. ETT was secured at 26 cm mark at the teeth. Placement was confirmed by auscuitation of lungs with good breath sounds bilaterally and no stomach sounds.  Condensation was noted on endotracheal tube.  Pulse ox 98%.  CO2 detector in place with appropriate color change. Complications: None . Operator: Nam. Chest radiograph ordered and pending. Comments: OGT placed via glidescope. Arcelia Jew MD intensivist   Nutrition Status: Nutrition Problem: Increased nutrient needs Etiology: catabolic illness (COVID 19, CHF, Myelodysplastic syndrome, cirrhosis) Signs/Symptoms: estimated needs    BMP Latest Ref Rng & Units 02/21/2020 02/20/2020 02/19/2020  Glucose 70 - 99 mg/dL 253(H) 177(H) 166(H)  BUN 8 - 23 mg/dL 62(H) 61(H) 75(H)  Creatinine 0.61 - 1.24 mg/dL 1.40(H) 1.31(H) 1.53(H)  BUN/Creat Ratio 10 - 24 - - -  Sodium 135 - 145 mmol/L 152(H) 158(H) 158(H)  Potassium 3.5 - 5.1 mmol/L 3.9 3.4(L) 3.1(L)  Chloride 98 - 111 mmol/L 112(H) 116(H) 112(H)  CO2 22 - 32 mmol/L 31 31 33(H)  Calcium 8.9 - 10.3 mg/dL 7.8(L) 8.0(L) 8.5(L)    Indwelling Urinary Catheter continued, requirement due to   Reason to continue  Indwelling Urinary Catheter strict Intake/Output monitoring for hemodynamic instability   Central Line/ continued, requirement due to  Reason to continue [place] of central venous pressure or other hemodynamic parameters and poor IV access    ASSESSMENT AND PLAN  PULMONARY Severe ACUTE Hypoxic and Hypercapnic Respiratory Failure secondary to COVID pneumonia - Continue LTVV per ARDSNet protocol; wean ventilator settings as able - Wean sedation for RASS goal 0 to -1 - Will discuss timing of TEE with Cardiology as patient is close to meeting parameters for extubation - Continue steroids for Covid pneumonia - Completed remdesivir - Vent bundle ordered - SpO2 goal >90%  RENAL AKI, improving Hypernatremia 2/2 poor PO intake - Maintain Foley catheter as above - Hypotonic fluids today; if no plans for TEE will transition to FW flushes - Monitor UOP, renal function   HEME MDS with severe anemia -[facility] -Required 3u prbc at admission for anemia -will transfuse again today to keep Hb above 8  NEUROLOGIC - intubated and sedated - minimal sedation to achieve a RASS goal: 0  CARDIAC SHOCK - Pressors for MAP goal >65 in setting of continuous sedation, active infection  CHRONIC SYSTOLIC CARDIAC FAILURE- EF -Hold Lasix -per cards, will transfuse to keep Hb >8  Chronic 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 Faecalis infection -Blood cultures positive for E. Faecalis -Per ID, continue rocephin and ampicillin -initial TTE negative for vegetations, awaiting TEE -Levophed if MAP <65   GI ICU GI prophylaxis  NUTRITIONAL STATUS DIET--> start TFs if no plans for TEE Constipation protocol as indicated  ENDO DM2 - SSI, FSBS q4H  ELECTROLYTES -follow labs as needed -replace as needed -pharmacy consultation and  following  DVT/GI PRX ordered and assessed TRANSFUSIONS AS NEEDED MONITOR FSBS I Assessed the need for Labs I Assessed the need for Foley I Assessed the need for Central Venous Line  Family Discussion when available I Assessed the need for Mobilization I made an Assessment of medications to be adjusted accordingly Safety Risk assessment Completed I discussed the plan with the patient's wife.  Critical Care Time devoted to patient care services described in this note is 45 minutes.   Overall, patient is critically ill, prognosis is guarded.  Patient with Multiorgan failure and at high risk for cardiac arrest and death.   CASE DISCUSSED IN MULTIDISCIPLINARY ROUNDS WITH ICU TEAM  Bennie Pierini, MD 02/21/20 1:01 PM

## 2020-02-21 NOTE — Progress Notes (Signed)
Progress Note  Patient Name: Evan [name] Date of Encounter: 02/21/2020  [facility] Cardiologist: [name] [name], MD   Subjective   COVID-19 pneumonia positive TAVR , EF 35 to 40% Increasing high flow nasal cannula requirements, developed encephalopathy, patient wanted to remain full code, Difficulty swallowing, anorexia Intubated yesterday 10 AM for airway protection, hypoxia Remains in atrial fibrillation, rate relatively well controlled  On broad-spectrum antibiotics Rocephin and ampicillin Blood cultures positive for Enterococcus facialis ID following, antibiotics minimum 6 weeks On remdesivir and steroids  FiO2 30% Also with strep gallolyticus in blood culture (colon cancer?) Team requesting TEE Platelets remaining low 20-30, with anemia No family at bedside at the time of rounds  Hypotensive systolic pressure 19-62 Inpatient Medications    Scheduled Meds: . allopurinol  100 mg Oral Daily  . chlorhexidine gluconate (MEDLINE KIT)  15 mL Mouth Rinse BID  . Chlorhexidine Gluconate Cloth  6 each Topical Daily  . docusate  100 mg Per Tube BID  . [START ON 02/22/2020] feeding supplement (PROSource TF)  90 mL Per Tube QID  . feeding supplement (VITAL AF 1.2 CAL)  1,000 mL Per Tube Q24H  . free water  250 mL Per Tube Q6H  . insulin aspart  0-20 Units Subcutaneous Q4H  . mouth rinse  15 mL Mouth Rinse 10 times per day  . methylPREDNISolone (SOLU-MEDROL) injection  40 mg Intravenous Q12H  . multivitamin with minerals  1 tablet Oral Daily  . pantoprazole (PROTONIX) IV  40 mg Intravenous Q24H  . polyethylene glycol  17 g Per Tube Daily  . tamsulosin  0.4 mg Oral Daily   Continuous Infusions: . sodium chloride    . ampicillin (OMNIPEN) IV Stopped (02/21/20 1612)  . cefTRIAXone (ROCEPHIN)  IV Stopped (02/21/20 1149)  . fentaNYL infusion INTRAVENOUS 50 mcg/hr (02/21/20 1820)  . norepinephrine (LEVOPHED) Adult infusion Stopped (02/21/20 1333)  . propofol (DIPRIVAN)  infusion 15 mcg/kg/min (02/21/20 1820)   PRN Meds: acetaminophen **OR** acetaminophen, ondansetron **OR** ondansetron (ZOFRAN) IV, polyethylene glycol   Vital Signs    Vitals:   02/21/20 1400 02/21/20 1500 02/21/20 1700 02/21/20 1800  BP: 107/69 97/64 (!) 88/54 (!) 93/59  Pulse: 73 64 64 68  Resp: _0 Temp:      TempSrc:      SpO2: 92% 93% (!) 89% 93%  Weight:      Height:        Intake/Output Summary (Last 24 hours) at 02/21/2020 1903 Last data filed at 02/21/2020 1820 Gross per 24 hour  Intake 3674.4 ml  Output 1205 ml  Net 2469.4 ml   Last 3 Weights 02/14/2020 02/04/2020 02/02/2020  Weight (lbs) 229 lb 4.5 oz 228 lb 13.4 oz 229 lb 15 oz  Weight (kg) 104 kg 103.8 kg 104.3 kg      Telemetry    Atrial fibrillation rate 70- Personally Reviewed  ECG     - Personally Reviewed  Physical Exam   GEN: No acute distress.   Neck: No JVD Cardiac: RRR, no murmurs, rubs, or gallops.  Respiratory: Clear to auscultation bilaterally. GI: Soft, nontender, non-distended  MS: No edema; No deformity. Neuro:  Nonfocal  Psych: Normal affect   Labs    High Sensitivity Troponin:   Recent Labs  Lab 02/01/2020 1907 02/16/20 0047  TROPONINIHS 210* 219*      Chemistry Recent Labs  Lab 02/21/2020 1907 02/16/20 0749 02/16/20 1525 02/17/20 0715 02/18/20 0700 02/20/20 0330 02/21/20 0228 02/21/20 1752  NA  153*   < > 152* 158*   < > 158* 152* 147*  K 3.6   < > 3.4* 2.9*   < > 3.4* 3.9 4.1  CL 101   < > 105 109   < > 116* 112* 109  CO2 34*   < > 33* 34*   < > _0 GLUCOSE 213*   < > 296* 127*   < > 177* 253* 161*  BUN 69*   < > 79* 74*   < > 61* 62* 63*  CREATININE 1.70*   < > 2.05* 1.81*   < > 1.31* 1.40* 1.49*  CALCIUM 8.7*   < > 8.1* 8.3*   < > 8.0* 7.8* 7.8*  PROT 6.5  --  5.4* 5.8*  --   --   --   --   ALBUMIN 2.8*  --  2.3* 2.3*  --   --   --   --   AST 32  --  30 35  --   --   --   --   ALT 25  --  21 22  --   --   --   --   ALKPHOS 107  --  84 82  --   --    --   --   BILITOT 4.2*  --  3.9* 3.8*  --   --   --   --   GFRNONAA 41*   < > 33* 38*   < > 56* 51* 48*  ANIONGAP 18*   < > 14 15   < > _1 < > = values in this interval not displayed.     Hematology Recent Labs  Lab 02/19/20 0500 02/20/20 0330 02/20/20 2000 02/21/20 0228  WBC 5.5 5.7  --  6.9  RBC 2.71* 2.49*  --  2.84*  HGB 8.0* 7.4* 8.3* 8.4*  HCT 26.7* 24.5* 26.3* 27.2*  MCV 98.5 98.4  --  95.8  MCH 29.5 29.7  --  29.6  MCHC 30.0 30.2  --  30.9  RDW 25.1* 24.6*  --  23.5*  PLT 30* 29*  --  32*    BNP Recent Labs  Lab 02/11/2020 1907  BNP 1,999.6*     DDimer No results for input(s): DDIMER in the last 168 hours.   Radiology    DG Chest 1 View  Result Date: 02/20/2020 Bronson Ing, MD     02/20/2020 10:36 AM Endotracheal Intubation: Patient required placement of an artificial airway secondary to Respiratory Failure Consent: Emergent. Hand washing performed prior to starting the procedure. Medications administered for sedation prior to procedure: Midazolam 2 mg IV,  Etomidate 30 mg IV, Succinylcholine 160 mcg IV. A time out procedure was called and correct patient, name, & ID confirmed. Needed supplies and equipment were assembled and checked to include ETT, 10 ml syringe, Glidescope, Mac and Miller blades, suction, oxygen and bag mask valve, end tidal CO2 monitor. Patient was positioned to align the mouth and pharynx to facilitate visualization of the glottis. Heart rate, SpO2 and blood pressure was continuously monitored during the procedure. Pre-oxygenation was conducted prior to intubation and endotracheal tube was placed through the vocal cords into the trachea.  The artificial airway was placed under direct visualization via glidescope route using a 8-0 ETT on the first attempt. ETT was secured at 26 cm mark at the teeth. Placement was confirmed by auscuitation of lungs with good breath sounds bilaterally  and no stomach sounds.  Condensation was noted on endotracheal  tube.  Pulse ox 98%.  CO2 detector in place with appropriate color change. Complications: None . Operator: Nam. Chest radiograph ordered and pending. Comments: OGT placed via glidescope. Arcelia Jew MD intensivist   DG Abd 1 View  Result Date: 02/20/2020 Bronson Ing, MD     02/20/2020 10:36 AM Endotracheal Intubation: Patient required placement of an artificial airway secondary to Respiratory Failure Consent: Emergent. Hand washing performed prior to starting the procedure. Medications administered for sedation prior to procedure: Midazolam 2 mg IV,  Etomidate 30 mg IV, Succinylcholine 160 mcg IV. A time out procedure was called and correct patient, name, & ID confirmed. Needed supplies and equipment were assembled and checked to include ETT, 10 ml syringe, Glidescope, Mac and Miller blades, suction, oxygen and bag mask valve, end tidal CO2 monitor. Patient was positioned to align the mouth and pharynx to facilitate visualization of the glottis. Heart rate, SpO2 and blood pressure was continuously monitored during the procedure. Pre-oxygenation was conducted prior to intubation and endotracheal tube was placed through the vocal cords into the trachea.  The artificial airway was placed under direct visualization via glidescope route using a 8-0 ETT on the first attempt. ETT was secured at 26 cm mark at the teeth. Placement was confirmed by auscuitation of lungs with good breath sounds bilaterally and no stomach sounds.  Condensation was noted on endotracheal tube.  Pulse ox 98%.  CO2 detector in place with appropriate color change. Complications: None . Operator: Nam. Chest radiograph ordered and pending. Comments: OGT placed via glidescope. Arcelia Jew MD intensivist    Cardiac Studies     Patient Profile     80 y.o. male history of CAD/CABG 2005, AS/TAVR 2019, pacemaker, permanent A. fib presenting with weakness, confusion, fall oral intake.  Diagnosed with COVID-19 pneumonia and bacteremia.   Echocardiogram revealing moderately reduced EF, 35 to 40%.  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  Results would likely not change course of therapy  Atrial fibrillation, persistent Anticoagulation on hold secondary to anemia, thrombocytopenia High bleed risk, platelets 20-30 Rate controlled  Cardiomyopathy Presumed to be ischemic given known coronary disease, prior bypass 2005 Too sick at this time for ischemic work-up   Anemia/thrombocytopenia/MDS In the setting of bacteremia, COVID Hematochezia Eliquis on hold Has received several units packed red blood cells For signs of bleeding, may need platelets  Critically ill, very complex High risk of death, consider palliative consult  Total encounter time more than 35 minutes  Greater than 50% was spent in counseling and coordination of care with the patient   For questions or updates, please contact [facility] Please consult [email or web address] for contact info under        Signed, Ida Rogue, MD  02/21/2020, 7:03 PM

## 2020-02-21 NOTE — Progress Notes (Signed)
ID  Pt remains intubated Sedated LDA Foley placed on 02/16/2020 CVC triple-lumen right internal jugular placed on 02/16/2020. ETT placed on 02/20/2020 NG/OG tube placed on 02/20/2020  Antibiotics Ampicillin 02/16/2020 Ceftriaxone 02/16/2020.  Patient Vitals for the past 24 hrs:  BP Temp Temp src Pulse Resp SpO2  02/21/20 1100 116/69 -- -- 68 18 97 %  02/21/20 1000 112/68 -- -- 71 18 96 %  02/21/20 0900 110/66 -- -- 73 18 95 %  02/21/20 0715 107/68 -- -- 67 18 100 %  02/21/20 0700 104/67 -- -- 64 18 99 %  02/21/20 0645 108/66 -- -- 64 18 99 %  02/21/20 0630 107/67 -- -- 69 18 99 %  02/21/20 0615 109/68 -- -- 64 18 99 %  02/21/20 0600 108/68 -- -- 64 18 99 %  02/21/20 0545 107/69 -- -- 72 18 99 %  02/21/20 0530 104/69 -- -- 68 18 99 %  02/21/20 0515 105/72 -- -- 68 18 99 %  02/21/20 0500 105/65 -- -- 64 19 99 %  02/21/20 0445 108/68 -- -- 65 18 99 %  02/21/20 0430 105/67 -- -- 61 18 99 %  02/21/20 0415 103/63 -- -- 63 18 99 %  02/21/20 0400 101/63 -- -- 65 18 99 %  02/21/20 0345 96/62 -- -- 67 18 98 %  02/21/20 0330 112/75 -- -- 65 18 98 %  02/21/20 0328 -- (!) 97 F (36.1 C) Axillary -- -- --  02/21/20 0315 111/68 -- -- 64 18 98 %  02/21/20 0300 109/67 -- -- 68 18 98 %  02/21/20 0245 107/67 -- -- 64 18 98 %  02/21/20 0230 103/66 -- -- 64 18 97 %  02/21/20 0215 103/61 -- -- 64 18 97 %  02/21/20 0200 106/64 -- -- (!) 41 18 96 %  02/21/20 0145 101/62 -- -- 71 18 94 %  02/21/20 0141 -- -- -- -- -- 94 %  02/21/20 0130 109/66 -- -- 65 15 99 %  02/21/20 0115 109/66 -- -- 64 18 99 %  02/21/20 0100 109/65 -- -- 67 18 99 %  02/21/20 0045 106/64 -- -- 62 18 99 %  02/21/20 0030 108/68 -- -- 64 18 99 %  02/21/20 0015 110/65 -- -- 64 18 99 %  02/21/20 0000 113/65 -- -- 65 18 98 %  02/20/20 2347 -- (!) 96.2 F (35.7 C) Axillary -- -- --  02/20/20 2345 111/67 -- -- 73 18 96 %  02/20/20 2330 101/70 -- -- (!) 58 14 100 %  02/20/20 2315 112/70 -- -- 64 13 100 %  02/20/20 2300 109/69 --  -- 64 14 100 %  02/20/20 2245 108/69 -- -- 64 15 100 %  02/20/20 2230 110/66 -- -- 64 14 100 %  02/20/20 2215 113/67 -- -- 64 15 100 %  02/20/20 2200 127/71 -- -- 64 18 99 %  02/20/20 2145 131/74 -- -- 64 18 99 %  02/20/20 2130 95/60 -- -- 69 15 100 %  02/20/20 2115 116/70 -- -- 66 18 100 %  02/20/20 2110 -- -- -- -- -- 99 %  02/20/20 2100 132/71 -- -- 64 (!) 33 99 %  02/20/20 2045 123/71 -- -- 71 18 99 %  02/20/20 2030 122/67 -- -- 64 (!) 21 99 %  02/20/20 2015 117/66 -- -- 64 (!) 30 99 %  02/20/20 2000 120/68 (!) 96.4 F (35.8 C) Axillary 64 (!) 29 99 %  02/20/20 1945  118/67 -- -- 64 (!) 32 99 %  02/20/20 1930 111/67 -- -- 64 (!) 28 99 %  02/20/20 1915 110/64 -- -- 65 (!) 30 98 %  02/20/20 1900 106/65 -- -- 64 (!) 27 99 %  02/20/20 1815 97/60 -- -- 73 (!) 29 96 %  02/20/20 1800 113/62 (!) 96.3 F (35.7 C) Axillary 67 19 98 %  02/20/20 1745 98/60 -- -- 64 18 98 %  02/20/20 1730 (!) 92/56 -- -- 64 (!) 21 96 %  02/20/20 1715 (!) 96/58 -- -- 65 20 96 %  02/20/20 1700 (!) 94/59 -- -- 64 18 98 %  02/20/20 1645 111/63 -- -- 64 18 97 %  02/20/20 1600 (!) 96/59 -- -- 61 18 99 %  02/20/20 1500 (!) 93/59 (!) 96 F (35.6 C) Axillary 72 18 98 %  02/20/20 1445 -- -- -- 65 18 100 %  02/20/20 1430 98/62 (!) 96 F (35.6 C) Axillary 65 18 100 %  02/20/20 1425 -- -- -- -- -- 100 %  On examination intubated, sedated Chest bilateral air entry HS: A. fib rate controlled Pacemaker in place Abdomen soft CNS cannot be assessed  Labs Micro 02/16/2020 blood culture Enterococcus faecalis 02/17/2020 blood cultures NG 02/17/2020 urine culture no growth  CBC Latest Ref Rng & Units 02/21/2020 02/20/2020 02/20/2020  WBC 4.0 - 10.5 K/uL 6.9 - 5.7  Hemoglobin 13.0 - 17.0 g/dL 8.4(L) 8.3(L) 7.4(L)  Hematocrit 39.0 - 52.0 % 27.2(L) 26.3(L) 24.5(L)  Platelets 150 - 400 K/uL 32(L) - 29(LL)    CMP Latest Ref Rng & Units 02/21/2020 02/20/2020 02/19/2020  Glucose 70 - 99 mg/dL 253(H) 177(H) 166(H)  BUN 8 - 23  mg/dL 62(H) 61(H) 75(H)  Creatinine 0.61 - 1.24 mg/dL 1.40(H) 1.31(H) 1.53(H)  Sodium 135 - 145 mmol/L 152(H) 158(H) 158(H)  Potassium 3.5 - 5.1 mmol/L 3.9 3.4(L) 3.1(L)  Chloride 98 - 111 mmol/L 112(H) 116(H) 112(H)  CO2 22 - 32 mmol/L 31 31 33(H)  Calcium 8.9 - 10.3 mg/dL 7.8(L) 8.0(L) 8.5(L)  Total Protein 6.5 - 8.1 g/dL - - -  Total Bilirubin 0.3 - 1.2 mg/dL - - -  Alkaline Phos 38 - 126 U/L - - -  AST 15 - 41 U/L - - -  ALT 0 - 44 U/L - - -    Impression/recommendation Acute hypoxic respiratory failure secondary to Covid intubated  Enterococcus bacteremia.  Has a pacemaker and TAVR 2D echo does not show any obvious valve involvement. He will need TEE to rule out valvular endocarditis and vegetation on the pacemaker wire.  But currently too unstable to have the procedure. Continue ampicillin and ceftriaxone. 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 Has received multiple PRBCs  CKD  Cirrhosis of the liver  A. fib: Rate controlled  Discussed the management with the care team ID will follow him peripherally this weekend.  Call if needed.

## 2020-02-21 NOTE — Progress Notes (Signed)
Nutrition Follow Up Note   DOCUMENTATION CODES:   Obesity unspecified  INTERVENTION:   Once pt appropriate for tube feeds, recommend:  Vital 1.2 '@40ml' /hr + Pro-Source 95m QID via tube  Propofol: 21.8 ml/hr- provides 575kcal/day   Free water flushes 342mq4 hours to maintain tube patency   Regimen provides 2047kcal/day, 160g/day protein and 95849may free water.   Pt at high refeed risk; recommend monitor potassium, magnesium and phosphorus labs daily until stable  NUTRITION DIAGNOSIS:   Increased nutrient needs related to catabolic illness (COVID 19, CHF, Myelodysplastic syndrome, cirrhosis) as evidenced by estimated needs.  GOAL:   Provide needs based on ASPEN/SCCM guidelines  -not met   MONITOR:   Vent status,Labs,Weight trends,Skin,I & O's  ASSESSMENT:   79 4o. 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 fib on Eliquis, type 2 diabetes, HTN, CKD 3a, cirrhosis and gout, recently hospitalized from 1/2-1/11 with respiratory failure secondary to systolic heart failure complicated by acute gout flare in right knee requiring arthrocentesis who presents to the emergency room with poor oral intake, confusion and protracted weakness and was found to have COVID 19   Pt remains sedated and ventilated. OGT in place but needs advancement; notified RN. Plan is for possible TEE today. Plan is to start tube feeds after TEE and once OGT confirmed in correct position. Palliative care following for GOC. Prior to intubation, pt declined any nasogastric or G-tube placement. Pt is at high refeed risk. No new weight since admit; will request daily weights.    Medications reviewed and include: allopurinol, colace, insulin, solu-medrol, MVI, protonix, miralax, omnipen, ceftriaxone, levophed, propofol  Labs reviewed: Na 152(H), K 3.9 wnl, BUN 62(H), creat  1.40(H), P 2.6 wnl, Mg 2.6(H) Hgb 8.4(L), Hct 27.2(L) cbgs- 209, 196, 197 x 24hrs  Patient is currently intubated on ventilator support MV: 8.9 L/min Temp (24hrs), Avg:96.5 F (35.8 C), Min:96 F (35.6 C), Max:97.1 F (36.2 C)  Propofol: 21.8 ml/hr- provides 575kcal/day   MAP- >33m39m UOP- [id number] Order:   Diet Order            Diet NPO time specified  Diet effective now                EDUCATION NEEDS:   No education needs have been identified at this time  Skin:  Skin Assessment: Reviewed RN Assessment (ecchymosis, Stage I sacrum)  Last BM:  1/25  Height:   Ht Readings from Last 1 Encounters:  02/08/2020 6' (1.829 m)    Weight:   Wt Readings from Last 1 Encounters:  01/25/2020 104 kg    Ideal Body Weight:  80.9 kg  BMI:  Body mass index is 31.1 kg/m.  Estimated Nutritional Needs:   Kcal:  1800kcal/day  Protein:  >160g/day  Fluid:  2.0L/day  CaseyKoleen DistanceRD, LDN Please refer to AMIONSelect Specialty Hospital - Cleveland FairhillRD and/or RD on-call/weekend/after hours pager

## 2020-02-22 DIAGNOSIS — Z7901 Long term (current) use of anticoagulants: Secondary | ICD-10-CM

## 2020-02-22 DIAGNOSIS — I4821 Permanent atrial fibrillation: Secondary | ICD-10-CM | POA: Diagnosis not present

## 2020-02-22 DIAGNOSIS — R7881 Bacteremia: Secondary | ICD-10-CM | POA: Diagnosis not present

## 2020-02-22 DIAGNOSIS — B952 Enterococcus as the cause of diseases classified elsewhere: Secondary | ICD-10-CM | POA: Diagnosis not present

## 2020-02-22 DIAGNOSIS — K922 Gastrointestinal hemorrhage, unspecified: Secondary | ICD-10-CM

## 2020-02-22 DIAGNOSIS — U071 COVID-19: Secondary | ICD-10-CM | POA: Diagnosis not present

## 2020-02-22 LAB — BASIC METABOLIC PANEL
Anion gap: 12 (ref 5–15)
BUN: 69 mg/dL — ABNORMAL HIGH (ref 8–23)
CO2: 29 mmol/L (ref 22–32)
Calcium: 7.6 mg/dL — ABNORMAL LOW (ref 8.9–10.3)
Chloride: 109 mmol/L (ref 98–111)
Creatinine, Ser: 1.63 mg/dL — ABNORMAL HIGH (ref 0.61–1.24)
GFR, Estimated: 43 mL/min — ABNORMAL LOW (ref >60)
Glucose, Bld: 130 mg/dL — ABNORMAL HIGH (ref 70–99)
Potassium: 4.3 mmol/L (ref 3.5–5.1)
Sodium: 150 mmol/L — ABNORMAL HIGH (ref 135–145)

## 2020-02-22 LAB — CULTURE, BLOOD (ROUTINE X 2)
Culture: NO GROWTH
Culture: NO GROWTH
Special Requests: ADEQUATE
Special Requests: ADEQUATE

## 2020-02-22 LAB — CBC
HCT: 24.6 % — ABNORMAL LOW (ref 39.0–52.0)
Hemoglobin: 7.7 g/dL — ABNORMAL LOW (ref 13.0–17.0)
MCH: 29.7 pg (ref 26.0–34.0)
MCHC: 31.3 g/dL (ref 30.0–36.0)
MCV: 95 fL (ref 80.0–100.0)
Platelets: 23 10*3/uL — CL (ref 150–400)
RBC: 2.59 MIL/uL — ABNORMAL LOW (ref 4.22–5.81)
RDW: 23 % — ABNORMAL HIGH (ref 11.5–15.5)
WBC: 4.1 10*3/uL (ref 4.0–10.5)
nRBC: 6.1 % — ABNORMAL HIGH (ref 0.0–0.2)

## 2020-02-22 LAB — GLUCOSE, CAPILLARY
Glucose-Capillary: 123 mg/dL — ABNORMAL HIGH (ref 70–99)
Glucose-Capillary: 128 mg/dL — ABNORMAL HIGH (ref 70–99)
Glucose-Capillary: 143 mg/dL — ABNORMAL HIGH (ref 70–99)
Glucose-Capillary: 172 mg/dL — ABNORMAL HIGH (ref 70–99)
Glucose-Capillary: 263 mg/dL — ABNORMAL HIGH (ref 70–99)

## 2020-02-22 LAB — FIBRIN DERIVATIVES D-DIMER (ARMC ONLY): Fibrin derivatives D-dimer (ARMC): 1077.86 ng/mL (FEU) — ABNORMAL HIGH (ref 0.00–499.00)

## 2020-02-22 LAB — PHOSPHORUS
Phosphorus: 3.7 mg/dL (ref 2.5–4.6)
Phosphorus: 4.9 mg/dL — ABNORMAL HIGH (ref 2.5–4.6)

## 2020-02-22 LAB — C-REACTIVE PROTEIN: CRP: 3.4 mg/dL — ABNORMAL HIGH (ref <1.0)

## 2020-02-22 LAB — MAGNESIUM
Magnesium: 2.5 mg/dL — ABNORMAL HIGH (ref 1.7–2.4)
Magnesium: 2.6 mg/dL — ABNORMAL HIGH (ref 1.7–2.4)

## 2020-02-22 LAB — FERRITIN: Ferritin: 907 ng/mL — ABNORMAL HIGH (ref 24–336)

## 2020-02-22 MED ORDER — SODIUM CHLORIDE 0.9 % IV SOLN
2.0000 g | Freq: Four times a day (QID) | INTRAVENOUS | Status: DC
  Administered 2020-02-22 – 2020-02-26 (×15): 2 g via INTRAVENOUS
  Filled 2020-02-22 (×2): qty 2000
  Filled 2020-02-22: qty 2
  Filled 2020-02-22 (×6): qty 2000
  Filled 2020-02-22 (×2): qty 2
  Filled 2020-02-22 (×6): qty 2000
  Filled 2020-02-22: qty 2

## 2020-02-22 MED ORDER — DEXTROSE 5 % IV SOLN: INTRAVENOUS | Status: AC

## 2020-02-22 MED ORDER — FREE WATER
200.0000 mL | Status: DC
  Administered 2020-02-22 – 2020-02-25 (×20): 200 mL

## 2020-02-22 NOTE — Progress Notes (Addendum)
CRITICAL CARE NOTE  SYNOPSIS  80 year old male presenting to the ED with poor p.o. take confusion and generalized weakness to the point of being unable to ambulate.  COVID 19 + sCHF+Liver cirrhosis  ED course: On initial vitals patient was afebrile, BP 119/76, HR 66, tachypneic in the 20's with SPO2 at 100% on 4 L nasal cannula.  Labs significant for: Hemoglobin 5.7 (was 8.1- 11 days prior), platelets 46,000 (reduced from 69,000), sodium 153, creatinine 1.77 (prior 1.16), ammonia 38, troponin 210, BNP 2000, COVID PCR positive.  CT head showed no acute abnormalities.  Patient was started on a blood transfusion 2 units PRBCs.  Hospitalist service was consulted for admission. Patient became hypoxic requiring 6 L nasal cannula.  BP also dropped with pressures in the high 16X low 09U systolically maps 04-54.  Lactic increased from 2.1-6.6.  Sepsis criteria initiated, PCCM consulted for further management and monitoring due to worsening respiratory status and possible impending shock.  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 Down to minimal vent settings today. Unable to perform TEE yesterday due to worsened clinical status. Palliative is following patient.   BP (!) 149/98   Pulse 82   Temp 99.8 F (37.7 C) (Axillary)   Resp 20   Ht 6' (1.829 m)   Wt 112.4 kg   SpO2 90%   BMI 33.61 kg/m    I/O last 3 completed shifts: In: 5305.7 [I.V.:3044.2; NG/GT:1050; IV Piggyback:1211.5] Out: 0981 [Urine:1480] Total I/O In: 1185.8 [I.V.:85.8; NG/GT:800; IV Piggyback:300] Out: 112 [Urine:112]  SpO2: 90 % O2 Flow Rate (L/min): 55 L/min FiO2 (%): 30 %  Estimated body mass index is 33.61 kg/m as calculated from the following:   Height as of this encounter: 6' (1.829 m).   Weight as of this encounter: 112.4 kg.  REVIEW OF SYSTEMS Unable to provide ROS due to medical condition of intubation.  Pressure Injury 02/16/20 Sacrum Mid Stage 1 -  Intact skin with non-blanchable redness of a localized area usually over a bony prominence. (Active)  02/16/20 1000  Location: Sacrum  Location Orientation: Mid  Staging: Stage 1 -  Intact skin with non-blanchable redness of a localized area usually over a bony prominence.  Wound Description (Comments):   Present on Admission: Yes   PHYSICAL EXAMINATION:  GENERAL: intubated male in NAD HEAD: Normocephalic, atraumatic.  EYES: Pupils pinpoint, reactive PULMONARY: CTA b/l, no W/C?R CARDIOVASCULAR: S1 and S2. Regular rate and rhythm. No murmurs, rubs, or gallops.  GASTROINTESTINAL: Soft, nontender, nondistended.  Positive bowel sounds.   MUSCULOSKELETAL: No swelling, clubbing, or edema.  NEUROLOGIC: RASS -4, not following commands SKIN:intact,warm,dry  MEDICATIONS: I have reviewed all medications and confirmed regimen as documented  CULTURE RESULTS   Recent Results (from the past 240 hour(s))  SARS Coronavirus 2 by RT PCR (hospital order, performed in [facility] hospital  lab) Nasopharyngeal Nasopharyngeal Swab     Status: Abnormal   Collection Time: 01/29/2020  7:07 PM   Specimen: Nasopharyngeal Swab  Result Value Ref Range Status   SARS Coronavirus 2 POSITIVE (A) NEGATIVE Final    Comment: RESULT CALLED TO, READ BACK BY AND VERIFIED WITH: CHRISTINE HALLAS AT 2050 02/10/2020. MF (NOTE) SARS-CoV-2 target nucleic acids are DETECTED  SARS-CoV-2 RNA is generally detectable in upper respiratory specimens  during the acute phase of infection.  Positive results are indicative  of the presence of the identified virus, but do not rule out bacterial infection or co-infection with other pathogens not detected by the test.  Clinical correlation with patient history and  other diagnostic information is necessary to determine patient infection status.  The expected result is negative.  Fact Sheet for Patients:   StrictlyIdeas.no   Fact Sheet for Healthcare Providers:   BankingDealers.co.za    This test is not yet approved or cleared by the [place] FDA and  has been authorized for detection and/or diagnosis of SARS-CoV-2 by FDA under an Emergency Use Authorization (EUA).  This EUA will remain in effect (meaning this  test can be used) for the duration of  the COVID-19 declaration under Section 564(b)(1) of the Act, 21 U.S.C. section 360-bbb-3(b)(1), unless the authorization is terminated or revoked sooner.  Performed at [facility], [address] [place], [place] [address]   CULTURE, BLOOD (ROUTINE X 2) w Reflex to ID Panel     Status: Abnormal   Collection Time: 02/16/20  2:11 AM   Specimen: BLOOD  Result Value Ref Range Status   Specimen Description   Final    BLOOD LEFT ANTECUBITAL Performed at [facility], [address] [place], [place] [address]    Special Requests   Final    BOTTLES DRAWN AEROBIC AND ANAEROBIC Blood Culture results may not be optimal due to an excessive volume  of blood received in culture bottles Performed at [facility], [address] [place], [place] [address]    Culture  Setup Time   Final    IN BOTH AEROBIC AND ANAEROBIC BOTTLES GRAM POSITIVE COCCI CRITICAL VALUE NOTED.  VALUE IS CONSISTENT WITH PREVIOUSLY REPORTED AND CALLED VALUE. Performed at [facility], [address], [place], [place] [address]    Culture (A)  Final    ENTEROCOCCUS FAECALIS SUSCEPTIBILITIES PERFORMED ON PREVIOUS CULTURE WITHIN THE LAST 5 DAYS. STREPTOCOCCUS GALLOLYTICUS THE SIGNIFICANCE OF ISOLATING THIS ORGANISM FROM A SINGLE SET OF BLOOD CULTURES WHEN MULTIPLE SETS ARE DRAWN IS UNCERTAIN. PLEASE NOTIFY THE MICROBIOLOGY DEPARTMENT WITHIN ONE WEEK IF SPECIATION AND SENSITIVITIES ARE REQUIRED. Performed at [facility], [place] [address] [place], [place] [address]    Report Status 02/21/2020 FINAL  Final  CULTURE, BLOOD (ROUTINE X 2) w Reflex to ID Panel     Status: Abnormal   Collection Time: 02/16/20  2:11 AM   Specimen: BLOOD  Result Value Ref Range Status   Specimen Description   Final    BLOOD BLOOD RIGHT FOREARM Performed at [facility], [address], [place], [place] [address]    Special Requests   Final    BOTTLES DRAWN AEROBIC AND ANAEROBIC Blood Culture adequate volume Performed at [facility], [address] [place],  [address]    Culture  Setup Time   Final    IN BOTH AEROBIC AND ANAEROBIC BOTTLES GRAM POSITIVE COCCI CRITICAL RESULT CALLED TO, READ BACK BY AND VERIFIED WITH: BRANDON BEERS 02/16/20 AT 1229 BY ACR Performed at [facility], [place] [address] [place], [place] [address]    Culture ENTEROCOCCUS FAECALIS (A)  Final   Report Status 02/18/2020 FINAL  Final   Organism ID, Bacteria ENTEROCOCCUS FAECALIS  Final      Susceptibility   Enterococcus faecalis - MIC*    AMPICILLIN <=2 SENSITIVE Sensitive     VANCOMYCIN 2 SENSITIVE Sensitive     GENTAMICIN SYNERGY SENSITIVE  Sensitive     * ENTEROCOCCUS FAECALIS  Blood Culture ID Panel (Reflexed)     Status: Abnormal   Collection Time: 02/16/20  2:11 AM  Result Value Ref Range Status   Enterococcus faecalis DETECTED (A) NOT DETECTED Final    Comment: CRITICAL RESULT CALLED TO, READ BACK BY AND VERIFIED WITH: BRANDON BEERS 02/16/20 AT 1229 BY ACR    Enterococcus Faecium NOT DETECTED NOT DETECTED Final   Listeria monocytogenes NOT DETECTED NOT DETECTED Final   Staphylococcus species NOT DETECTED NOT DETECTED Final   Staphylococcus aureus (BCID) NOT DETECTED NOT DETECTED Final   Staphylococcus epidermidis NOT DETECTED NOT DETECTED Final   Staphylococcus lugdunensis NOT DETECTED NOT DETECTED Final   Streptococcus species NOT DETECTED NOT DETECTED Final   Streptococcus agalactiae NOT DETECTED NOT DETECTED Final   Streptococcus pneumoniae NOT DETECTED NOT DETECTED Final   Streptococcus pyogenes NOT DETECTED NOT DETECTED Final   A.calcoaceticus-baumannii NOT DETECTED NOT DETECTED Final   Bacteroides fragilis NOT DETECTED NOT DETECTED Final   Enterobacterales NOT DETECTED NOT DETECTED Final   Enterobacter cloacae complex NOT DETECTED NOT DETECTED Final   Escherichia coli NOT DETECTED NOT DETECTED Final   Klebsiella aerogenes NOT DETECTED NOT DETECTED Final   Klebsiella oxytoca NOT DETECTED NOT DETECTED Final   Klebsiella pneumoniae NOT DETECTED NOT DETECTED Final   Proteus species NOT DETECTED NOT DETECTED Final   Salmonella species NOT DETECTED NOT DETECTED Final   Serratia marcescens NOT DETECTED NOT DETECTED Final   Haemophilus influenzae NOT DETECTED NOT DETECTED Final   Neisseria meningitidis NOT DETECTED NOT DETECTED Final   Pseudomonas aeruginosa NOT DETECTED NOT DETECTED Final   Stenotrophomonas maltophilia NOT DETECTED NOT DETECTED Final   Candida albicans NOT DETECTED NOT DETECTED Final   Candida auris NOT DETECTED NOT DETECTED Final   Candida glabrata NOT DETECTED NOT DETECTED Final   Candida  krusei NOT DETECTED NOT DETECTED Final   Candida parapsilosis NOT DETECTED NOT DETECTED Final   Candida tropicalis NOT DETECTED NOT DETECTED Final   Cryptococcus neoformans/gattii NOT DETECTED NOT DETECTED Final   Vancomycin resistance NOT DETECTED NOT DETECTED Final    Comment: Performed at [facility], [address] [place], [place] [address]  CULTURE, BLOOD (ROUTINE X 2) w Reflex to ID Panel     Status: None   Collection Time: 02/17/20  2:16 AM   Specimen: BLOOD  Result Value Ref Range Status   Specimen Description BLOOD BLOOD LEFT HAND  Final   Special Requests   Final    BOTTLES DRAWN AEROBIC AND ANAEROBIC Blood Culture adequate volume   Culture   Final    NO GROWTH 5 DAYS Performed at [facility], [address] [place], [place] [address]    Report Status 02/22/2020 FINAL  Final  CULTURE, BLOOD (ROUTINE X 2) w Reflex to ID Panel  Status: None   Collection Time: 02/17/20  2:16 AM   Specimen: BLOOD  Result Value Ref Range Status   Specimen Description BLOOD BLOOD RIGHT HAND  Final   Special Requests   Final    BOTTLES DRAWN AEROBIC AND ANAEROBIC Blood Culture adequate volume   Culture   Final    NO GROWTH 5 DAYS Performed at [facility], [address], [place], [place] [address]    Report Status 02/22/2020 FINAL  Final  Urine Culture     Status: None   Collection Time: 02/17/20  7:15 AM   Specimen: Urine, Random  Result Value Ref Range Status   Specimen Description   Final    URINE, RANDOM Performed at [facility], [address] [place], [place] [address]    Special Requests   Final    NONE Performed at [facility], [address], [place], [place] [address]    Culture   Final    NO GROWTH Performed at [facility], [place] [address], [place], [place] [address]    Report Status 02/18/2020 FINAL  Final  MRSA PCR Screening     Status: None   Collection Time: 02/17/20  7:15 AM  Result Value Ref Range  Status   MRSA by PCR NEGATIVE NEGATIVE Final    Comment:        The GeneXpert MRSA Assay (FDA approved for NASAL specimens only), is one component of a comprehensive MRSA colonization surveillance program. It is not intended to diagnose MRSA infection nor to guide or monitor treatment for MRSA infections. Performed at [facility], [address], [place], [place] [address]       IMAGING   No results found. Nutrition Status: Nutrition Problem: Increased nutrient needs Etiology: catabolic illness (COVID 19, CHF, Myelodysplastic syndrome, cirrhosis) Signs/Symptoms: estimated needs    BMP Latest Ref Rng & Units 02/22/2020 02/21/2020 02/21/2020  Glucose 70 - 99 mg/dL 130(H) 161(H) 253(H)  BUN 8 - 23 mg/dL 69(H) 63(H) 62(H)  Creatinine 0.61 - 1.24 mg/dL 1.63(H) 1.49(H) 1.40(H)  BUN/Creat Ratio 10 - 24 - - -  Sodium 135 - 145 mmol/L 150(H) 147(H) 152(H)  Potassium 3.5 - 5.1 mmol/L 4.3 4.1 3.9  Chloride 98 - 111 mmol/L 109 109 112(H)  CO2 22 - 32 mmol/L 29 28 31   Calcium 8.9 - 10.3 mg/dL 7.6(L) 7.8(L) 7.8(L)    Indwelling Urinary Catheter continued, requirement due to   Reason to continue Indwelling Urinary Catheter strict Intake/Output monitoring for hemodynamic instability   Central Line/ continued, requirement due to  Reason to continue [place] of central venous pressure or other hemodynamic parameters and poor IV access    ASSESSMENT AND PLAN  PULMONARY Severe ACUTE Hypoxic and Hypercapnic Respiratory Failure secondary to COVID pneumonia - Continue LTVV per [employer] protocol; wean ventilator settings as able - Wean sedation for RASS goal 0 to -1 - Will discuss timing of TEE with Cardiology as patient is close to meeting parameters for extubation - Continue steroids for Covid pneumonia - Completed remdesivir - Vent bundle ordered - SpO2 goal >90% -Mechanically tolerating SBT/PSV today, but will not arouse to follow commands -precluding  extubation for mental status and cough today  RENAL AKI, improving Hypernatremia 2/2 poor PO intake - Maintain Foley catheter as above - Hypotonic fluids today;FWF 200q4h -Add D5W 75cc/hr 12h - Monitor UOP, renal function   HEME MDS with severe anemia -Followed by [facility] oncology -Required 3u prbc at admission  for anemia -will transfuse again today to keep Hb above 8  NEUROLOGIC - intubated and sedated - minimal sedation to achieve a RASS goal: 0 -Presently off all sedation  CARDIAC SHOCK - Pressors for MAP goal >65 in setting of continuous sedation, active infection  CHRONIC SYSTOLIC CARDIAC FAILURE- EF -Hold Lasix -per cards, will transfuse to keep Hb >8  Chronic 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 Faecalis infection -Blood cultures positive for E. Faecalis -Per ID, continue rocephin and ampicillin -initial TTE negative for vegetations, awaiting TEE -Levophed if MAP <65   GI ICU GI prophylaxis  NUTRITIONAL STATUS DIET--> start TFs if no plans for TEE Constipation protocol as indicated  ENDO DM2 - SSI, FSBS q4H  ELECTROLYTES -follow labs as needed -replace as needed -pharmacy consultation and following  DVT/GI PRX ordered and assessed TRANSFUSIONS AS NEEDED MONITOR FSBS I Assessed the need for Labs I Assessed the need for Foley I Assessed the need for Central Venous Line Family Discussion when available I Assessed the need for Mobilization I made an Assessment of medications to be adjusted accordingly Safety Risk assessment Completed   Critical Care Time devoted to patient care services described in this note is 45 minutes.   Overall, patient is critically ill, prognosis is guarded.  Patient with Multiorgan failure and at high risk for cardiac arrest and death.   Nelle Don, MD 03-16-2020 4:00 PM

## 2020-02-22 NOTE — Progress Notes (Signed)
Progress Note  Patient Name: Evan [name] Date of Encounter: 02/22/2020  [facility] Cardiologist: [name] [name], MD   Subjective   Intubated and sedated.  Unable to assess.  Inpatient Medications    Scheduled Meds: . allopurinol  100 mg Oral Daily  . chlorhexidine gluconate (MEDLINE KIT)  15 mL Mouth Rinse BID  . Chlorhexidine Gluconate Cloth  6 each Topical Daily  . docusate  100 mg Per Tube BID  . feeding supplement (PROSource TF)  90 mL Per Tube QID  . feeding supplement (VITAL AF 1.2 CAL)  1,000 mL Per Tube Q24H  . free water  200 mL Per Tube Q4H  . insulin aspart  0-20 Units Subcutaneous Q4H  . mouth rinse  15 mL Mouth Rinse 10 times per day  . methylPREDNISolone (SOLU-MEDROL) injection  40 mg Intravenous Q12H  . multivitamin with minerals  1 tablet Oral Daily  . pantoprazole (PROTONIX) IV  40 mg Intravenous Q24H  . polyethylene glycol  17 g Per Tube Daily  . tamsulosin  0.4 mg Oral Daily   Continuous Infusions: . sodium chloride    . ampicillin (OMNIPEN) IV Stopped (02/22/20 0913)  . cefTRIAXone (ROCEPHIN)  IV 200 mL/hr at 02/22/20 1000  . fentaNYL infusion INTRAVENOUS 50 mcg/hr (02/22/20 1000)  . norepinephrine (LEVOPHED) Adult infusion Stopped (02/21/20 1333)  . propofol (DIPRIVAN) infusion 15 mcg/kg/min (02/22/20 1000)   PRN Meds: acetaminophen **OR** acetaminophen, ondansetron **OR** ondansetron (ZOFRAN) IV, polyethylene glycol   Vital Signs    Vitals:   02/22/20 0800 02/22/20 0816 02/22/20 0900 02/22/20 1000  BP: (!) 120/57   126/61  Pulse: 71  74 74  Resp: _0 Temp: 100 F (37.8 C)     TempSrc: Oral     SpO2: 91% 91% 90% 90%  Weight:      Height:        Intake/Output Summary (Last 24 hours) at 02/22/2020 1034 Last data filed at 02/22/2020 1000 Gross per 24 hour  Intake 3680.64 ml  Output 720 ml  Net 2960.64 ml   Last 3 Weights 02/22/2020 02/18/2020 02/04/2020  Weight (lbs) 247 lb 12.8 oz 229 lb 4.5 oz 228 lb 13.4 oz  Weight (kg)  112.4 kg 104 kg 103.8 kg      Telemetry    Atrial fibrillation.  Ventricular paced.- Personally Reviewed  ECG    n/a- Personally Reviewed  Physical Exam   VS:  BP 126/61   Pulse 74   Temp 100 F (37.8 C) (Oral)   Resp 18   Ht 6' (1.829 m)   Wt 112.4 kg   SpO2 90%   BMI 33.61 kg/m  , BMI Body mass index is 33.61 kg/m. GENERAL: Critically ill. HEENT: Pupils equal round and reactive, fundi not visualized, oral mucosa unremarkable NECK:  No jugular venous distention, waveform within normal limits, carotid upstroke brisk and symmetric, no bruits LUNGS: Vented breath sounds anteriorly HEART: Irregularly irregular.  PMI not displaced or sustained,S1 and S2 within normal limits, no S3, no S4, no clicks, no rubs, no murmurs ABD:  Flat, positive bowel sounds normal in frequency in pitch, no bruits, no rebound, no guarding, no midline pulsatile mass, no hepatomegaly, no splenomegaly EXT:  2 plus pulses throughout, 2+ LE edema to the mid tibia bilaterally, no cyanosis no clubbing SKIN:  No rashes no nodules NEURO:  Intubated.  Unable to assess PSYCH: Intubated.  Unable to assess   Labs    High Sensitivity Troponin:  Recent Labs  Lab 02/11/2020 1907 02/16/20 0047  TROPONINIHS 210* 219*      Chemistry Recent Labs  Lab 02/11/2020 1907 02/16/20 0749 02/16/20 1525 02/17/20 0715 02/18/20 0700 02/21/20 0228 02/21/20 1752 02/22/20 0415  NA 153*   < > 152* 158*   < > 152* 147* 150*  K 3.6   < > 3.4* 2.9*   < > 3.9 4.1 4.3  CL 101   < > 105 109   < > 112* 109 109  CO2 34*   < > 33* 34*   < > _0 GLUCOSE 213*   < > 296* 127*   < > 253* 161* 130*  BUN 69*   < > 79* 74*   < > 62* 63* 69*  CREATININE 1.70*   < > 2.05* 1.81*   < > 1.40* 1.49* 1.63*  CALCIUM 8.7*   < > 8.1* 8.3*   < > 7.8* 7.8* 7.6*  PROT 6.5  --  5.4* 5.8*  --   --   --   --   ALBUMIN 2.8*  --  2.3* 2.3*  --   --   --   --   AST 32  --  30 35  --   --   --   --   ALT 25  --  21 22  --   --   --   --    ALKPHOS 107  --  84 82  --   --   --   --   BILITOT 4.2*  --  3.9* 3.8*  --   --   --   --   GFRNONAA 41*   < > 33* 38*   < > 51* 48* 43*  ANIONGAP 18*   < > 14 15   < > _1 < > = values in this interval not displayed.     Hematology Recent Labs  Lab 02/20/20 0330 02/20/20 2000 02/21/20 0228 02/22/20 0415  WBC 5.7  --  6.9 4.1  RBC 2.49*  --  2.84* 2.59*  HGB 7.4* 8.3* 8.4* 7.7*  HCT 24.5* 26.3* 27.2* 24.6*  MCV 98.4  --  95.8 95.0  MCH 29.7  --  29.6 29.7  MCHC 30.2  --  30.9 31.3  RDW 24.6*  --  23.5* 23.0*  PLT 29*  --  32* 23*    BNP Recent Labs  Lab 02/02/2020 1907  BNP 1,999.6*     DDimer No results for input(s): DDIMER in the last 168 hours.   Radiology    DG Chest 1 View  Result Date: 02/20/2020 Bronson Ing, MD     02/20/2020 10:36 AM Endotracheal Intubation: Patient required placement of an artificial airway secondary to Respiratory Failure Consent: Emergent. Hand washing performed prior to starting the procedure. Medications administered for sedation prior to procedure: Midazolam 2 mg IV,  Etomidate 30 mg IV, Succinylcholine 160 mcg IV. A time out procedure was called and correct patient, name, & ID confirmed. Needed supplies and equipment were assembled and checked to include ETT, 10 ml syringe, Glidescope, Mac and Miller blades, suction, oxygen and bag mask valve, end tidal CO2 monitor. Patient was positioned to align the mouth and pharynx to facilitate visualization of the glottis. Heart rate, SpO2 and blood pressure was continuously monitored during the procedure. Pre-oxygenation was conducted prior to intubation and endotracheal tube was placed through the vocal cords into the trachea.  The  artificial airway was placed under direct visualization via glidescope route using a 8-0 ETT on the first attempt. ETT was secured at 26 cm mark at the teeth. Placement was confirmed by auscuitation of lungs with good breath sounds bilaterally and no stomach sounds.   Condensation was noted on endotracheal tube.  Pulse ox 98%.  CO2 detector in place with appropriate color change. Complications: None . Operator: Nam. Chest radiograph ordered and pending. Comments: OGT placed via glidescope. Arcelia Jew MD intensivist   DG Abd 1 View  Result Date: 02/20/2020 Bronson Ing, MD     02/20/2020 10:36 AM Endotracheal Intubation: Patient required placement of an artificial airway secondary to Respiratory Failure Consent: Emergent. Hand washing performed prior to starting the procedure. Medications administered for sedation prior to procedure: Midazolam 2 mg IV,  Etomidate 30 mg IV, Succinylcholine 160 mcg IV. A time out procedure was called and correct patient, name, & ID confirmed. Needed supplies and equipment were assembled and checked to include ETT, 10 ml syringe, Glidescope, Mac and Miller blades, suction, oxygen and bag mask valve, end tidal CO2 monitor. Patient was positioned to align the mouth and pharynx to facilitate visualization of the glottis. Heart rate, SpO2 and blood pressure was continuously monitored during the procedure. Pre-oxygenation was conducted prior to intubation and endotracheal tube was placed through the vocal cords into the trachea.  The artificial airway was placed under direct visualization via glidescope route using a 8-0 ETT on the first attempt. ETT was secured at 26 cm mark at the teeth. Placement was confirmed by auscuitation of lungs with good breath sounds bilaterally and no stomach sounds.  Condensation was noted on endotracheal tube.  Pulse ox 98%.  CO2 detector in place with appropriate color change. Complications: None . Operator: Nam. Chest radiograph ordered and pending. Comments: OGT placed via glidescope. Arcelia Jew MD intensivist    Cardiac Studies   Echo 02/17/20:  1. Left ventricular ejection fraction, by estimation, is 35 to 40%. The  left ventricle has severely decreased function. The left ventricle  demonstrates global  hypokinesis. The left ventricular internal cavity size  was severely dilated. There is mild  concentric left ventricular hypertrophy. Left ventricular diastolic  parameters are consistent with Grade III diastolic dysfunction  (restrictive).  2. Right ventricular systolic function is severely reduced. The right  ventricular size is severely enlarged.  3. Left atrial size was severely dilated.  4. Right atrial size was severely dilated.  5. The mitral valve is degenerative. Mild mitral valve regurgitation. No  evidence of mitral stenosis. Severe mitral annular calcification.  6. Tricuspid valve regurgitation is moderate.  7. The aortic valve is calcified. Aortic valve regurgitation is mild.  Mild aortic valve sclerosis is present, with no evidence of aortic valve  stenosis.  8. The inferior vena cava is normal in size with greater than 50%  respiratory variability, suggesting right atrial pressure of 3 mmHg.   Echo 11/2019: 1. Left ventricular ejection fraction, by estimation, is 45 to 50%. The  left ventricle has mildly decreased function. The left ventricle  demonstrates global hypokinesis. There is moderate concentric left  ventricular hypertrophy. Left ventricular  diastolic function could not be evaluated.  2. Right ventricular systolic function is moderately reduced. The right  ventricular size is moderately enlarged. There is severely elevated  pulmonary artery systolic pressure. The estimated right ventricular  systolic pressure is 66.0 mmHg.  3. Left atrial size was severely dilated.  4. Right atrial size was moderately dilated.  5. The mitral valve is degenerative. Mild mitral valve regurgitation.  Moderate to severe mitral annular calcification.  6. The aortic valve has been repaired/replaced. Aortic valve  regurgitation is not visualized. Procedure Date: 04/29/2017. Echo findings  are consistent with normal structure and function of the aortic valve   prosthesis. Aortic valve mean gradient measures  11.0 mmHg. Aortic valve Vmax measures 2.24 m/s.  7. The inferior vena cava is dilated in size with <50% respiratory  variability, suggesting right atrial pressure of 15 mmHg.   Patient Profile     80 y.o. male with CAD status post CABG, aortic stenosis status post TAVR, acute on chronic systolic and diastolic heart failure, paroxysmal atrial fibrillation and sick sinus syndrome status post pacemaker admitted with encephalopathy and COVID-19 pneumonia and bacteremia.  Assessment & Plan    # COVID-19 pneumonia: Currently intubated and on remdesivir and high-dose steroids.  Per critical care and ID.  # Enterococcus bacteremia: # Strep gallolyticus bacteremia:  Not a candidate for TEE due to acute illness and persistent thrombocytopenia.  Unlikely to change management as he is not a candidate for surgery.  Although his systolic function is slightly worse, his mean aortic valve gradient is slightly better than his last echo 11/2019.  There are no new changes in regurgitant lesions.  Suspect that endocarditis is not playing a major part in his current medical condition.  Continue antibiotics per infectious disease.  Concern for colon cancer given Strep gallolyticus bacteremia, though this work-up is on hold due to his acute medical issues.  #Acute on chronic systolic diastolic heart failure: LVEF is slightly worse this admission than 11/2019.  IVC this admission was consistent with a right atrial pressure of 3 compared with 15 on his prior echo.  This suggested volume overload is not his major cause of hypotension.  Suspect it is more related to COVID-19 pneumonia and bacteremia.  He is persistently hypernatremic, suggesting intravascular volume depletion.  Continue free water supplementation.  # Anemia:  # Thrombocytopenia: Eliquis on hold.  Patient also with hematochezia.   # Persistent atrial fibrillation:  Anticoagulation on hold as above.   Plt 29K today.    We will continue to follow from a far.  Please contact if there are new questions or concerns.  For questions or updates, please contact [place] Please consult [email or web address] for contact info under        Signed, Skeet Latch, MD  02/22/2020, 10:34 AM

## 2020-02-22 NOTE — Plan of Care (Signed)
Pt tolerated SBT this shift from 1006 until 1615.  Sedation off at this time; pt remains very lethargic, unable to follow commands and is not purposeful although he does partially open his eyes.  VPacing, wife at bedside for several hours.  Pt remains oliguric. Urine is amber.  TF at goal of 40 cc/hr.  No BM this shift.

## 2020-02-22 NOTE — Progress Notes (Signed)
PHARMACY NOTE:  ANTIMICROBIAL RENAL DOSAGE ADJUSTMENT  Current antimicrobial regimen includes a mismatch between antimicrobial dosage and estimated renal function.  As per policy approved by the Pharmacy & Therapeutics and Medical Executive Committees, the antimicrobial dosage will be adjusted accordingly.  Current antimicrobial dosage:  Ampicillin 2g q4h  Indication: Enterococcus bacteremia  Renal Function: < 79ml/min  Estimated Creatinine Clearance: 48.3 mL/min (A) (by C-G formula based on SCr of 1.63 mg/dL (H)). []      On intermittent HD, scheduled: []      On CRRT    Antimicrobial dosage has been changed to:  Ampicillin 2g q6h  Additional comments:   Thank you for allowing pharmacy to be a part of this patient's care.  Lu Duffel, PharmD, BCPS Clinical Pharmacist 02/22/2020 1:45 PM

## 2020-02-22 NOTE — Consult Note (Signed)
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 He was admitted with acute respiratory failure secondary to COVID pneumonia, AKI which has improved, and enterococcal bacteremia. Patient was intubated on 1/27 and placed on vent. Pharmacy has been consulted for electrolyte monitoring and replacement.   Nutrition: When stable for tube feed per RD: Vital 49mL/hr + Prosource 56mL 5 times daily + Free water flushes 57mL q4h  MIVF: D5W KCl 20 mEq / L @ 134mL/hr   Goal of Therapy:  Electrolytes WNL   Plan:  No replacement needed. Pharmacy will continue to monitor and evaluate electrolytes.     Tawnya Crook, PharmD  02/21/2020

## 2020-02-23 ENCOUNTER — Inpatient Hospital Stay: Payer: Medicare Other

## 2020-02-23 DIAGNOSIS — B952 Enterococcus as the cause of diseases classified elsewhere: Secondary | ICD-10-CM | POA: Diagnosis not present

## 2020-02-23 DIAGNOSIS — R7881 Bacteremia: Secondary | ICD-10-CM | POA: Diagnosis not present

## 2020-02-23 LAB — BASIC METABOLIC PANEL
Anion gap: 11 (ref 5–15)
BUN: 91 mg/dL — ABNORMAL HIGH (ref 8–23)
CO2: 26 mmol/L (ref 22–32)
Calcium: 7.5 mg/dL — ABNORMAL LOW (ref 8.9–10.3)
Chloride: 106 mmol/L (ref 98–111)
Creatinine, Ser: 2.03 mg/dL — ABNORMAL HIGH (ref 0.61–1.24)
GFR, Estimated: 33 mL/min — ABNORMAL LOW (ref >60)
Glucose, Bld: 348 mg/dL — ABNORMAL HIGH (ref 70–99)
Potassium: 4 mmol/L (ref 3.5–5.1)
Sodium: 143 mmol/L (ref 135–145)

## 2020-02-23 LAB — HEPATIC FUNCTION PANEL
ALT: 30 U/L (ref 0–44)
AST: 39 U/L (ref 15–41)
Albumin: 1.9 g/dL — ABNORMAL LOW (ref 3.5–5.0)
Alkaline Phosphatase: 160 U/L — ABNORMAL HIGH (ref 38–126)
Bilirubin, Direct: 2.2 mg/dL — ABNORMAL HIGH (ref 0.0–0.2)
Indirect Bilirubin: 1.5 mg/dL — ABNORMAL HIGH (ref 0.3–0.9)
Total Bilirubin: 3.7 mg/dL — ABNORMAL HIGH (ref 0.3–1.2)
Total Protein: 4.9 g/dL — ABNORMAL LOW (ref 6.5–8.1)

## 2020-02-23 LAB — CBC
HCT: 27.6 % — ABNORMAL LOW (ref 39.0–52.0)
Hemoglobin: 8.9 g/dL — ABNORMAL LOW (ref 13.0–17.0)
MCH: 30.4 pg (ref 26.0–34.0)
MCHC: 32.2 g/dL (ref 30.0–36.0)
MCV: 94.2 fL (ref 80.0–100.0)
Platelets: 36 10*3/uL — ABNORMAL LOW (ref 150–400)
RBC: 2.93 MIL/uL — ABNORMAL LOW (ref 4.22–5.81)
RDW: 23.9 % — ABNORMAL HIGH (ref 11.5–15.5)
WBC: 7.9 10*3/uL (ref 4.0–10.5)
nRBC: 4.2 % — ABNORMAL HIGH (ref 0.0–0.2)

## 2020-02-23 LAB — FIBRIN DERIVATIVES D-DIMER (ARMC ONLY): Fibrin derivatives D-dimer (ARMC): 1669.45 ng/mL (FEU) — ABNORMAL HIGH (ref 0.00–499.00)

## 2020-02-23 LAB — GLUCOSE, CAPILLARY
Glucose-Capillary: 193 mg/dL — ABNORMAL HIGH (ref 70–99)
Glucose-Capillary: 211 mg/dL — ABNORMAL HIGH (ref 70–99)
Glucose-Capillary: 230 mg/dL — ABNORMAL HIGH (ref 70–99)

## 2020-02-23 LAB — C-REACTIVE PROTEIN: CRP: 2.6 mg/dL — ABNORMAL HIGH (ref <1.0)

## 2020-02-23 LAB — AMMONIA: Ammonia: 46 umol/L — ABNORMAL HIGH (ref 9–35)

## 2020-02-23 LAB — PHOSPHORUS: Phosphorus: 4 mg/dL (ref 2.5–4.6)

## 2020-02-23 LAB — FERRITIN: Ferritin: 1169 ng/mL — ABNORMAL HIGH (ref 24–336)

## 2020-02-23 LAB — MAGNESIUM: Magnesium: 2.6 mg/dL — ABNORMAL HIGH (ref 1.7–2.4)

## 2020-02-23 MED ORDER — FENTANYL CITRATE (PF) 100 MCG/2ML IJ SOLN
50.0000 ug | INTRAMUSCULAR | Status: DC | PRN
  Administered 2020-02-24 – 2020-02-25 (×2): 100 ug via INTRAVENOUS
  Filled 2020-02-23 (×3): qty 2

## 2020-02-23 MED ORDER — ALBUMIN HUMAN 25 % IV SOLN
25.0000 g | Freq: Three times a day (TID) | INTRAVENOUS | Status: DC
  Administered 2020-02-23 – 2020-02-24 (×2): 25 g via INTRAVENOUS
  Filled 2020-02-23 (×2): qty 100

## 2020-02-23 NOTE — Consult Note (Signed)
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 He was admitted with acute respiratory failure secondary to COVID pneumonia, AKI which has improved, and enterococcal bacteremia. Patient was intubated on 1/27 and placed on vent. Pharmacy has been consulted for electrolyte monitoring and replacement.   Goal of Therapy:  Electrolytes WNL   Plan:  Na improved on D5. Free water flushes continued at 200 ml q4h. No further electrolyte replacement indicated. Pharmacy will continue to monitor and evaluate electrolytes.     Tawnya Crook, PharmD  02/21/2020

## 2020-02-23 NOTE — Plan of Care (Signed)
Wife at bedside several hours this shift; updated on POC by me and MD, understanding verbalized.  UOP improved today, although creatinine continues to increase, urine is dark amber, TF infusing w/no issues, fentanyl off at 1430, pt raises left arm but is not purposeful, partially opens eyes briefly at times when wife speaks to him but is not purposeful.  No SBT today d/t patient not alert enough at EOS.

## 2020-02-23 NOTE — Progress Notes (Signed)
CRITICAL CARE NOTE  SYNOPSIS  80 year old male presenting to the ED with poor p.o. take confusion and generalized weakness to the point of being unable to ambulate.  COVID 19 + sCHF+Liver cirrhosis  ED course: On initial vitals patient was afebrile, BP 119/76, HR 66, tachypneic in the 20's with SPO2 at 100% on 4 L nasal cannula.  Labs significant for: Hemoglobin 5.7 (was 8.1- 11 days prior), platelets 46,000 (reduced from 69,000), sodium 153, creatinine 1.77 (prior 1.16), ammonia 38, troponin 210, BNP 2000, COVID PCR positive.  CT head showed no acute abnormalities.  Patient was started on a blood transfusion 2 units PRBCs.  Hospitalist service was consulted for admission. Patient became hypoxic requiring 6 L nasal cannula.  BP also dropped with pressures in the high [id number] low 0000000 systolically maps [id number].  Lactic increased from 2.1-6.6.  Sepsis criteria initiated, PCCM consulted for further management and monitoring due to worsening respiratory status and possible impending shock.  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 CVA T2DM Cirrhosis CKD Stage 3b TAVR Gout  Significant Hospital Events:  02/11/2020 Admit to SDU +ENTEROBACTERMIA 02/16/20 CVC placed, patient transitioned to heated high flow nasal cannula 02/20/20 Intubated, 8.0 ETT Consults:  PCCM  Procedures:  02/16/2020 CVC 3L RIJ >> 02/20/20 Intubation  Significant Diagnostic Tests:  01/25/2020 CT head >> no acute intracranial abnormalities, 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-similar to prior examinations  Micro Data:  01/30/2020 COVID-19 >> positive 02/16/2020 BC x2 >> E faecalis  Antimicrobials:  02/24/2020 cefepime 1/22-1/23/2022 metronidazole 1/22-1/23/2022 vancomycin 1/22-1/27/2022 remdesivir 02/16/2020 - present ceftriaxone 02/16/2020 - present  ampicillin   CC  follow up respiratory failure  SUBJECTIVE Intubated  (1/27). Down to minimal vent settings today. Unable to perform TEE yesterday due to worsened clinical status. Palliative is following patient.  Patient not tolerating SBT.  Becomes asynchronous during SAT.   BP (!) 146/68   Pulse 86   Temp 98 F (36.7 C) (Oral)   Resp (!) 23   Ht 6' (1.829 m)   Wt 112.4 kg   SpO2 93%   BMI 33.61 kg/m    I/O last 3 completed shifts: In: 3330.7 [I.V.:272; NG/GT:2258.7; IV Piggyback:800] Out: 2337 [Urine:2337] No intake/output data recorded.  SpO2: 93 % O2 Flow Rate (L/min): 55 L/min FiO2 (%): 30 %  Estimated body mass index is 33.61 kg/m as calculated from the following:   Height as of this encounter: 6' (1.829 m).   Weight as of this encounter: 112.4 kg.  REVIEW OF SYSTEMS Unable to provide ROS due to medical condition of intubation.  Pressure Injury 02/16/20 Sacrum Mid Stage 1 -  Intact skin with non-blanchable redness of a localized area usually over a bony prominence. (Active)  02/16/20 1000  Location: Sacrum  Location Orientation: Mid  Staging: Stage 1 -  Intact skin with non-blanchable redness of a localized area usually over a bony prominence.  Wound Description (Comments):   Present on Admission: Yes   PHYSICAL EXAMINATION: Physical examination is limited due to need for PPE/CAPR GENERAL: intubated male, synchronous with the ventilator as long as sedation present.   HEAD: Normocephalic, atraumatic.  EYES: PERRLA, mild icterus PULMONARY: Coarse breath sounds, cannot discern any other adventitious sounds CARDIOVASCULAR: Monitor: Regular rate and rhythm GASTROINTESTINAL: Soft, nontender, nondistended MUSCULOSKELETAL: +1-2 pitting edema lower extremities, no clubbing NEUROLOGIC: RASS -3, not following commands, asynchronous when  patient lowered further SKIN:intact,warm,dry     CULTURE RESULTS   Recent Results (from the past 240 hour(s))  SARS  Coronavirus 2 by RT PCR (hospital order, performed in [facility] hospital lab) Nasopharyngeal Nasopharyngeal Swab     Status: Abnormal   Collection Time: 02/04/2020  7:07 PM   Specimen: Nasopharyngeal Swab  Result Value Ref Range Status   SARS Coronavirus 2 POSITIVE (A) NEGATIVE Final    Comment: RESULT CALLED TO, READ BACK BY AND VERIFIED WITH: CHRISTINE HALLAS AT 2050 02/10/2020. MF (NOTE) SARS-CoV-2 target nucleic acids are DETECTED  SARS-CoV-2 RNA is generally detectable in upper respiratory specimens  during the acute phase of infection.  Positive results are indicative  of the presence of the identified virus, but do not rule out bacterial infection or co-infection with other pathogens not detected by the test.  Clinical correlation with patient history and  other diagnostic information is necessary to determine patient infection status.  The expected result is negative.  Fact Sheet for Patients:   StrictlyIdeas.no   Fact Sheet for Healthcare Providers:   BankingDealers.co.za    This test is not yet approved or cleared by the [place] FDA and  has been authorized for detection and/or diagnosis of SARS-CoV-2 by FDA under an Emergency Use Authorization (EUA).  This EUA will remain in effect (meaning this  test can be used) for the duration of  the COVID-19 declaration under Section 564(b)(1) of the Act, 21 U.S.C. section 360-bbb-3(b)(1), unless the authorization is terminated or revoked sooner.  Performed at [facility], [address] [place], [place] [address]   CULTURE, BLOOD (ROUTINE X 2) w Reflex to ID Panel     Status: Abnormal   Collection Time: 02/16/20  2:11 AM   Specimen: BLOOD  Result Value Ref Range Status   Specimen Description   Final    BLOOD LEFT ANTECUBITAL Performed at [facility], [address] [place], [place] [address]    Special Requests   Final    BOTTLES DRAWN AEROBIC AND  ANAEROBIC Blood Culture results may not be optimal due to an excessive volume of blood received in culture bottles Performed at [facility], [address], [place], [place] [address]    Culture  Setup Time   Final    IN BOTH AEROBIC AND ANAEROBIC BOTTLES GRAM POSITIVE COCCI CRITICAL VALUE NOTED.  VALUE IS CONSISTENT WITH PREVIOUSLY REPORTED AND CALLED VALUE. Performed at [facility], [address] [place], [place] [address]    Culture (A)  Final    ENTEROCOCCUS FAECALIS SUSCEPTIBILITIES PERFORMED ON PREVIOUS CULTURE WITHIN THE LAST 5 DAYS. STREPTOCOCCUS GALLOLYTICUS THE SIGNIFICANCE OF ISOLATING THIS ORGANISM FROM A SINGLE SET OF BLOOD CULTURES WHEN MULTIPLE SETS ARE DRAWN IS UNCERTAIN. PLEASE NOTIFY THE MICROBIOLOGY DEPARTMENT WITHIN ONE WEEK IF SPECIATION AND SENSITIVITIES ARE REQUIRED. Performed at [facility], [place] [address], [place], [place] [address]    Report Status 02/21/2020 FINAL  Final  CULTURE, BLOOD (ROUTINE X 2) w Reflex to ID Panel     Status: Abnormal   Collection Time: 02/16/20  2:11 AM   Specimen: BLOOD  Result Value Ref Range Status   Specimen Description   Final    BLOOD BLOOD RIGHT FOREARM Performed at [facility], [address], [place],  [address]    Special Requests   Final    BOTTLES DRAWN AEROBIC AND ANAEROBIC Blood Culture adequate volume Performed at [facility], [address],  [place], [place] [address]    Culture  Setup Time   Final    IN BOTH AEROBIC AND ANAEROBIC BOTTLES GRAM POSITIVE COCCI CRITICAL RESULT CALLED TO, READ BACK BY AND VERIFIED WITH: BRANDON BEERS 02/16/20 AT 1229 BY ACR Performed at [facility], [place] [address], [place], [place] [address]    Culture ENTEROCOCCUS FAECALIS (A)  Final   Report Status 02/18/2020 FINAL  Final   Organism ID, Bacteria ENTEROCOCCUS FAECALIS  Final      Susceptibility   Enterococcus faecalis - MIC*    AMPICILLIN <=2 SENSITIVE Sensitive      VANCOMYCIN 2 SENSITIVE Sensitive     GENTAMICIN SYNERGY SENSITIVE Sensitive     * ENTEROCOCCUS FAECALIS  Blood Culture ID Panel (Reflexed)     Status: Abnormal   Collection Time: 02/16/20  2:11 AM  Result Value Ref Range Status   Enterococcus faecalis DETECTED (A) NOT DETECTED Final    Comment: CRITICAL RESULT CALLED TO, READ BACK BY AND VERIFIED WITH: BRANDON BEERS 02/16/20 AT 1229 BY ACR    Enterococcus Faecium NOT DETECTED NOT DETECTED Final   Listeria monocytogenes NOT DETECTED NOT DETECTED Final   Staphylococcus species NOT DETECTED NOT DETECTED Final   Staphylococcus aureus (BCID) NOT DETECTED NOT DETECTED Final   Staphylococcus epidermidis NOT DETECTED NOT DETECTED Final   Staphylococcus lugdunensis NOT DETECTED NOT DETECTED Final   Streptococcus species NOT DETECTED NOT DETECTED Final   Streptococcus agalactiae NOT DETECTED NOT DETECTED Final   Streptococcus pneumoniae NOT DETECTED NOT DETECTED Final   Streptococcus pyogenes NOT DETECTED NOT DETECTED Final   A.calcoaceticus-baumannii NOT DETECTED NOT DETECTED Final   Bacteroides fragilis NOT DETECTED NOT DETECTED Final   Enterobacterales NOT DETECTED NOT DETECTED Final   Enterobacter cloacae complex NOT DETECTED NOT DETECTED Final   Escherichia coli NOT DETECTED NOT DETECTED Final   Klebsiella aerogenes NOT DETECTED NOT DETECTED Final   Klebsiella oxytoca NOT DETECTED NOT DETECTED Final   Klebsiella pneumoniae NOT DETECTED NOT DETECTED Final   Proteus species NOT DETECTED NOT DETECTED Final   Salmonella species NOT DETECTED NOT DETECTED Final   Serratia marcescens NOT DETECTED NOT DETECTED Final   Haemophilus influenzae NOT DETECTED NOT DETECTED Final   Neisseria meningitidis NOT DETECTED NOT DETECTED Final   Pseudomonas aeruginosa NOT DETECTED NOT DETECTED Final   Stenotrophomonas maltophilia NOT DETECTED NOT DETECTED Final   Candida albicans NOT DETECTED NOT DETECTED Final   Candida auris NOT DETECTED NOT DETECTED  Final   Candida glabrata NOT DETECTED NOT DETECTED Final   Candida krusei NOT DETECTED NOT DETECTED Final   Candida parapsilosis NOT DETECTED NOT DETECTED Final   Candida tropicalis NOT DETECTED NOT DETECTED Final   Cryptococcus neoformans/gattii NOT DETECTED NOT DETECTED Final   Vancomycin resistance NOT DETECTED NOT DETECTED Final    Comment: Performed at [facility], [address] [place], [place] [address]  CULTURE, BLOOD (ROUTINE X 2) w Reflex to ID Panel     Status: None   Collection Time: 02/17/20  2:16 AM   Specimen: BLOOD  Result Value Ref Range Status   Specimen Description BLOOD BLOOD LEFT HAND  Final   Special Requests   Final    BOTTLES DRAWN AEROBIC AND ANAEROBIC Blood Culture adequate volume   Culture   Final    NO GROWTH 5 DAYS Performed at [facility], [address] [place], [place] [address]    Report Status 02/22/2020 FINAL  Final  CULTURE, BLOOD (ROUTINE X 2) w  Reflex to ID Panel     Status: None   Collection Time: 02/17/20  2:16 AM   Specimen: BLOOD  Result Value Ref Range Status   Specimen Description BLOOD BLOOD RIGHT HAND  Final   Special Requests   Final    BOTTLES DRAWN AEROBIC AND ANAEROBIC Blood Culture adequate volume   Culture   Final    NO GROWTH 5 DAYS Performed at [facility], [address] [place], [place] [address]    Report Status 02/22/2020 FINAL  Final  Urine Culture     Status: None   Collection Time: 02/17/20  7:15 AM   Specimen: Urine, Random  Result Value Ref Range Status   Specimen Description   Final    URINE, RANDOM Performed at [facility], [address], [place], [place] [address]    Special Requests   Final    NONE Performed at [facility], [address], [place], [place] [address]    Culture   Final    NO GROWTH Performed at [facility], [place] [address], [place], [place] [address]    Report Status 02/18/2020 FINAL  Final  MRSA PCR Screening     Status:  None   Collection Time: 02/17/20  7:15 AM  Result Value Ref Range Status   MRSA by PCR NEGATIVE NEGATIVE Final    Comment:        The GeneXpert MRSA Assay (FDA approved for NASAL specimens only), is one component of a comprehensive MRSA colonization surveillance program. It is not intended to diagnose MRSA infection nor to guide or monitor treatment for MRSA infections. Performed at [facility], [address] [place], [place] [address]        IMAGING   DG Chest Port 1 View  Result Date: 02/23/2020 CLINICAL DATA:  Endotracheal tube EXAM: PORTABLE CHEST 1 VIEW COMPARISON:  3 days ago FINDINGS: Endotracheal tube with tip at the carina. Enteric tube with tip at least reaching the upper stomach. Right IJ line with tip at the upper cavoatrial junction. Cardiomegaly. There has been CABG and aortic valve repair. Pacer lead into the right ventricle. Bilateral pneumonia. Worsened aeration at the left base where the diaphragm is no longer distinct. No evidence of air leak. These results will be called to the ordering clinician or representative by the Radiologist Assistant, and communication documented in the PACS or [employer]. IMPRESSION: 1. Lower endotracheal tube with tip at the carina. Consider retraction by approximately 3 cm. 2. Bilateral pneumonia with worsened aeration at the left base. Electronically Signed   By: Monte Fantasia M.D.   On: 02/23/2020 05:37   Nutrition Status: Nutrition Problem: Increased nutrient needs Etiology: catabolic illness (COVID 19, CHF, Myelodysplastic syndrome, cirrhosis) Signs/Symptoms: estimated needs    Results for orders placed or performed during the hospital encounter of 02/14/2020 (from the past 24 hour(s))  Glucose, capillary     Status: Abnormal   Collection Time: 02/22/20  9:59 PM  Result Value Ref Range   Glucose-Capillary 263 (H) 70 - 99 mg/dL  Ferritin     Status: Abnormal   Collection Time: 02/23/20  5:00 AM  Result Value  Ref Range   Ferritin 1,169 (H) 24 - 336 ng/mL  Magnesium     Status: Abnormal   Collection Time: 02/23/20  5:00 AM  Result Value Ref Range   Magnesium 2.6 (H) 1.7 - 2.4 mg/dL  Phosphorus     Status: None   Collection Time:  02/23/20  5:00 AM  Result Value Ref Range   Phosphorus 4.0 2.5 - 4.6 mg/dL  CBC     Status: Abnormal   Collection Time: 02/23/20  5:00 AM  Result Value Ref Range   WBC 7.9 4.0 - 10.5 K/uL   RBC 2.93 (L) 4.22 - 5.81 MIL/uL   Hemoglobin 8.9 (L) 13.0 - 17.0 g/dL   HCT 27.6 (L) 39.0 - 52.0 %   MCV 94.2 80.0 - 100.0 fL   MCH 30.4 26.0 - 34.0 pg   MCHC 32.2 30.0 - 36.0 g/dL   RDW 23.9 (H) 11.5 - 15.5 %   Platelets 36 (L) 150 - 400 K/uL   nRBC 4.2 (H) 0.0 - 0.2 %  Basic metabolic panel     Status: Abnormal   Collection Time: 02/23/20  5:00 AM  Result Value Ref Range   Sodium 143 135 - 145 mmol/L   Potassium 4.0 3.5 - 5.1 mmol/L   Chloride 106 98 - 111 mmol/L   CO2 26 22 - 32 mmol/L   Glucose, Bld 348 (H) 70 - 99 mg/dL   BUN 91 (H) 8 - 23 mg/dL   Creatinine, Ser 2.03 (H) 0.61 - 1.24 mg/dL   Calcium 7.5 (L) 8.9 - 10.3 mg/dL   GFR, Estimated 33 (L) >60 mL/min   Anion gap 11 5 - 15  Hepatic function panel     Status: Abnormal   Collection Time: 02/23/20  5:00 AM  Result Value Ref Range   Total Protein 4.9 (L) 6.5 - 8.1 g/dL   Albumin 1.9 (L) 3.5 - 5.0 g/dL   AST 39 15 - 41 U/L   ALT 30 0 - 44 U/L   Alkaline Phosphatase 160 (H) 38 - 126 U/L   Total Bilirubin 3.7 (H) 0.3 - 1.2 mg/dL   Bilirubin, Direct 2.2 (H) 0.0 - 0.2 mg/dL   Indirect Bilirubin 1.5 (H) 0.3 - 0.9 mg/dL  C-reactive protein     Status: Abnormal   Collection Time: 02/23/20  6:50 AM  Result Value Ref Range   CRP 2.6 (H) <1.0 mg/dL  Fibrin derivatives D-Dimer ([facility] only)     Status: Abnormal   Collection Time: 02/23/20  6:50 AM  Result Value Ref Range   Fibrin derivatives D-dimer ([facility]) 1,669.45 (H) 0.00 - 499.00 ng/mL (FEU)  Ammonia     Status: Abnormal   Collection Time: 02/23/20 10:20 AM   Result Value Ref Range   Ammonia 46 (H) 9 - 35 umol/L  Glucose, capillary     Status: Abnormal   Collection Time: 02/23/20 12:16 PM  Result Value Ref Range   Glucose-Capillary 193 (H) 70 - 99 mg/dL    .hepti  Indwelling Urinary Catheter continued, requirement due to   Reason to continue Indwelling Urinary Catheter strict Intake/Output monitoring for hemodynamic instability   Central Line/ continued, requirement due to  Reason to continue Prospect of central venous pressure or other hemodynamic parameters and poor IV access    ASSESSMENT AND PLAN  PULMONARY Severe ACUTE Hypoxic and Hypercapnic Respiratory Failure secondary to COVID pneumonia - Continue LTVV per [facility] protocol; wean ventilator settings as able - Wean sedation for RASS goal 0 to -1 - Will discuss timing of TEE with Cardiology as patient is close to meeting parameters for extubation - Continue steroids for Covid pneumonia - Completed remdesivir - Vent bundle ordered - SpO2 goal >90% -Not tolerating SBT/PSV today, will not arouse to follow commands -precluding extubation for mental status and  asynchrony  RENAL AKI, improving Hypernatremia 2/2 poor PO intake - Maintain Foley catheter as above - Hypotonic fluids today;FWF 200q4h -Add D5W 75cc/hr 12h - Monitor UOP, renal function   HEME MDS with severe anemia -[facility]ed [facility] -Required 3u prbc at admission for anemia -will transfuse again today to keep Hb above 8  NEUROLOGIC Acute metabolic encephalopathy - intubated and sedated - minimal sedation to achieve a RASS goal: 0 - Had to resume sedation due to asynchrony - Check hepatic panel and ammonia level  CARDIAC SHOCK - Pressors for MAP goal >65 in setting of continuous sedation, active infection  CHRONIC SYSTOLIC CARDIAC FAILURE- EF -Hold Lasix -per cards, will transfuse to keep Hb >8  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 Faecalis infection -Blood cultures positive for E. Faecalis -Per ID, continue rocephin and ampicillin -initial TTE negative for vegetations, awaiting TEE -Levophed if MAP <65   GI ICU GI prophylaxis  NUTRITIONAL STATUS DIET--> start TFs if no plans for TEE Constipation protocol as indicated  ENDO DM2 - SSI, FSBS q4H  ELECTROLYTES -follow labs as needed -replace as needed -pharmacy consultation and following  DVT/GI PRX ordered and assessed TRANSFUSIONS AS NEEDED MONITOR FSBS I Assessed the need for Labs I Assessed the need for Foley I Assessed the need for Central Venous Line Family Discussion when available I Assessed the need for Mobilization I made an Assessment of medications to be adjusted accordingly Safety Risk assessment Completed   Critical Care Time devoted to patient care services described in this note is 45 minutes.   Patient's wife apprised at bedside x2.  Overall, patient is critically ill, prognosis is guarded.  Patient with multiorgan failure and at high risk for cardiac arrest and death.  Continue discussions of goals of care with patient's family and palliative care.  Renold Don, MD [facility]  02/23/20 10:07 AM   *This note was dictated using voice recognition software/Dragon.  Despite best efforts to proofread, errors can occur which can change the meaning.  Any change was purely unintentional.

## 2020-02-23 NOTE — Progress Notes (Signed)
ETT noted to be at 28 from lip & was at the carina on cxr. Withdrawn 2 cm back to 26 cm.

## 2020-02-24 ENCOUNTER — Inpatient Hospital Stay: Payer: Medicare Other

## 2020-02-24 DIAGNOSIS — D61818 Other pancytopenia: Secondary | ICD-10-CM

## 2020-02-24 DIAGNOSIS — K717 Toxic liver disease with fibrosis and cirrhosis of liver: Secondary | ICD-10-CM

## 2020-02-24 DIAGNOSIS — U071 COVID-19: Secondary | ICD-10-CM | POA: Diagnosis not present

## 2020-02-24 DIAGNOSIS — J9601 Acute respiratory failure with hypoxia: Secondary | ICD-10-CM | POA: Diagnosis not present

## 2020-02-24 DIAGNOSIS — R7881 Bacteremia: Secondary | ICD-10-CM | POA: Diagnosis not present

## 2020-02-24 DIAGNOSIS — G9341 Metabolic encephalopathy: Secondary | ICD-10-CM | POA: Diagnosis not present

## 2020-02-24 DIAGNOSIS — R4182 Altered mental status, unspecified: Secondary | ICD-10-CM | POA: Diagnosis not present

## 2020-02-24 DIAGNOSIS — D469 Myelodysplastic syndrome, unspecified: Secondary | ICD-10-CM | POA: Diagnosis not present

## 2020-02-24 LAB — BLOOD GAS, ARTERIAL
Acid-base deficit: 0.2 mmol/L (ref 0.0–2.0)
Bicarbonate: 25.4 mmol/L (ref 20.0–28.0)
FIO2: 0.28
MECHVT: 500 mL
O2 Saturation: 86.8 %
PEEP: 5 cmH2O
Patient temperature: 37
RATE: 18 resp/min
pCO2 arterial: 45 mmHg (ref 32.0–48.0)
pH, Arterial: 7.36 (ref 7.350–7.450)
pO2, Arterial: 55 mmHg — ABNORMAL LOW (ref 83.0–108.0)

## 2020-02-24 LAB — BASIC METABOLIC PANEL
Anion gap: 13 (ref 5–15)
BUN: 87 mg/dL — ABNORMAL HIGH (ref 8–23)
CO2: 25 mmol/L (ref 22–32)
Calcium: 7.6 mg/dL — ABNORMAL LOW (ref 8.9–10.3)
Chloride: 109 mmol/L (ref 98–111)
Creatinine, Ser: 1.65 mg/dL — ABNORMAL HIGH (ref 0.61–1.24)
GFR, Estimated: 42 mL/min — ABNORMAL LOW (ref >60)
Glucose, Bld: 245 mg/dL — ABNORMAL HIGH (ref 70–99)
Potassium: 4.1 mmol/L (ref 3.5–5.1)
Sodium: 147 mmol/L — ABNORMAL HIGH (ref 135–145)

## 2020-02-24 LAB — GLUCOSE, CAPILLARY
Glucose-Capillary: 187 mg/dL — ABNORMAL HIGH (ref 70–99)
Glucose-Capillary: 190 mg/dL — ABNORMAL HIGH (ref 70–99)
Glucose-Capillary: 200 mg/dL — ABNORMAL HIGH (ref 70–99)
Glucose-Capillary: 209 mg/dL — ABNORMAL HIGH (ref 70–99)
Glucose-Capillary: 214 mg/dL — ABNORMAL HIGH (ref 70–99)
Glucose-Capillary: 232 mg/dL — ABNORMAL HIGH (ref 70–99)
Glucose-Capillary: 237 mg/dL — ABNORMAL HIGH (ref 70–99)
Glucose-Capillary: 276 mg/dL — ABNORMAL HIGH (ref 70–99)
Glucose-Capillary: 282 mg/dL — ABNORMAL HIGH (ref 70–99)
Glucose-Capillary: 337 mg/dL — ABNORMAL HIGH (ref 70–99)

## 2020-02-24 LAB — CBC
HCT: 25.9 % — ABNORMAL LOW (ref 39.0–52.0)
Hemoglobin: 8.4 g/dL — ABNORMAL LOW (ref 13.0–17.0)
MCH: 30.1 pg (ref 26.0–34.0)
MCHC: 32.4 g/dL (ref 30.0–36.0)
MCV: 92.8 fL (ref 80.0–100.0)
Platelets: 34 10*3/uL — ABNORMAL LOW (ref 150–400)
RBC: 2.79 MIL/uL — ABNORMAL LOW (ref 4.22–5.81)
RDW: 24.2 % — ABNORMAL HIGH (ref 11.5–15.5)
WBC: 8.9 10*3/uL (ref 4.0–10.5)
nRBC: 3.3 % — ABNORMAL HIGH (ref 0.0–0.2)

## 2020-02-24 LAB — FERRITIN: Ferritin: 1135 ng/mL — ABNORMAL HIGH (ref 24–336)

## 2020-02-24 LAB — FIBRIN DERIVATIVES D-DIMER (ARMC ONLY): Fibrin derivatives D-dimer (ARMC): 1620.6 ng/mL (FEU) — ABNORMAL HIGH (ref 0.00–499.00)

## 2020-02-24 LAB — MAGNESIUM: Magnesium: 2.5 mg/dL — ABNORMAL HIGH (ref 1.7–2.4)

## 2020-02-24 LAB — TRIGLYCERIDES: Triglycerides: 207 mg/dL — ABNORMAL HIGH (ref <150)

## 2020-02-24 LAB — C-REACTIVE PROTEIN: CRP: 1.5 mg/dL — ABNORMAL HIGH (ref <1.0)

## 2020-02-24 LAB — PHOSPHORUS: Phosphorus: 3.5 mg/dL (ref 2.5–4.6)

## 2020-02-24 MED ORDER — LACTULOSE 10 GM/15ML PO SOLN
20.0000 g | Freq: Two times a day (BID) | ORAL | Status: DC
  Administered 2020-02-24 – 2020-02-25 (×3): 20 g
  Filled 2020-02-24 (×3): qty 30

## 2020-02-24 MED ORDER — METHYLPREDNISOLONE SODIUM SUCC 40 MG IJ SOLR
20.0000 mg | Freq: Two times a day (BID) | INTRAMUSCULAR | Status: DC
  Administered 2020-02-24: 20 mg via INTRAVENOUS
  Filled 2020-02-24: qty 1

## 2020-02-24 MED ORDER — MIDAZOLAM HCL 2 MG/2ML IJ SOLN
2.0000 mg | INTRAMUSCULAR | Status: DC | PRN
  Administered 2020-02-24 – 2020-02-25 (×2): 4 mg via INTRAVENOUS
  Filled 2020-02-24 (×3): qty 2
  Filled 2020-02-24: qty 4
  Filled 2020-02-24: qty 2

## 2020-02-24 NOTE — Progress Notes (Signed)
Date of Admission:  02/02/2020   ** LDA Foley placed on 02/16/2020 CVC triple-lumen right internal jugular placed on 02/16/2020. ETT placed on 02/20/2020 NG/OG tube placed on 02/20/2020  Antibiotics Ampicillin 02/16/2020 Ceftriaxone 02/16/2020.         ID: Evan [name] is a 80 y.o. male  Principal Problem:   Enterococcal bacteremia Active Problems:   Coronary artery disease   Diabetes mellitus ([place])   Essential hypertension   Atrial fibrillation, permanent (HCC)   S/P TAVR (transcatheter aortic valve replacement)   Diabetes mellitus type 2, uncomplicated (HCC)   Symptomatic anemia   Cirrhosis (HCC)   AKI (acute kidney injury) ([place])   MDS (myelodysplastic syndrome) (HCC)   Chronic systolic CHF (congestive heart failure) (HCC)   Gout flare   Acute respiratory failure due to COVID-19 [facility])   Acute metabolic encephalopathy   Elevated troponin   Chronic anticoagulation   Generalized weakness   Hypernatremia   Hyperammonemia (HCC)   Pressure injury of skin   Severe sepsis with septic shock (HCC)   Lactic acidosis   Acute respiratory failure with hypoxia (HCC)   Hypokalemia   Sepsis ([place])    Subjective: Remains intubated  Medications:  . allopurinol  100 mg Oral Daily  . chlorhexidine gluconate (MEDLINE KIT)  15 mL Mouth Rinse BID  . Chlorhexidine Gluconate Cloth  6 each Topical Daily  . docusate  100 mg Per Tube BID  . feeding supplement (PROSource TF)  90 mL Per Tube QID  . feeding supplement (VITAL AF 1.2 CAL)  1,000 mL Per Tube Q24H  . free water  200 mL Per Tube Q4H  . insulin aspart  0-20 Units Subcutaneous Q4H  . mouth rinse  15 mL Mouth Rinse 10 times per day  . methylPREDNISolone (SOLU-MEDROL) injection  40 mg Intravenous Q12H  . multivitamin with minerals  1 tablet Oral Daily  . pantoprazole (PROTONIX) IV  40 mg Intravenous Q24H  . polyethylene glycol  17 g Per Tube Daily  . tamsulosin  0.4 mg Oral Daily    Objective: Vital signs in last 24  hours: Temp:  [96 F (35.6 C)-98.7 F (37.1 C)] 97.2 F (36.2 C) (01/31 0400) Pulse Rate:  [66-97] 82 (01/31 0600) Resp:  [16-24] 22 (01/31 0600) BP: (101-158)/(50-122) 133/63 (01/31 0600) SpO2:  [90 %-94 %] 90 % (01/31 0600) FiO2 (%):  [28 %-30 %] 28 % (01/31 0745) Weight:  [109.9 kg] 109.9 kg (01/31 0500)  PHYSICAL EXAM:  General: intubated and sedated  Head: Normocephalic, without obvious abnormality, atraumatic. Eyes: Conjunctivae clear, anicteric sclerae. Pupils are equal ENT cannot assess Lungs b/l air entry Heart:irregular- controlled Abdomen: Soft, non-tender,not distended. Bowel sounds normal. No masses Extremities: edematous Skin: No rashes or lesions. Or bruising Lymph: Cervical, supraclavicular normal. Neurologic: sedated  Lab Results Recent Labs    02/22/20 0415 02/23/20 0500 02/24/20 0610  WBC 4.1 7.9 8.9  HGB 7.7* 8.9* 8.4*  HCT 24.6* 27.6* 25.9*  NA 150* 143  --   K 4.3 4.0  --   CL 109 106  --   CO2 29 26  --   BUN 69* 91*  --   CREATININE 1.63* 2.03*  --    Liver Panel Recent Labs    02/23/20 0500  PROT 4.9*  ALBUMIN 1.9*  AST 39  ALT 30  ALKPHOS 160*  BILITOT 3.7*  BILIDIR 2.2*  IBILI 1.5*   Sedimentation Rate No results for input(s): ESRSEDRATE in the last 72 hours. C-Reactive  Protein Recent Labs    02/22/20 0415 02/23/20 0650  CRP 3.4* 2.6*    Microbiology: Micro 02/16/2020 blood culture Enterococcus faecalis in both sets and Streptococcus galolyticus in 1 set 02/17/2020 blood culturesNG 02/17/2020 urine culture no growth  Studies/Results: DG Chest Port 1 View  Result Date: 02/23/2020 CLINICAL DATA:  Endotracheal tube EXAM: PORTABLE CHEST 1 VIEW COMPARISON:  3 days ago FINDINGS: Endotracheal tube with tip at the carina. Enteric tube with tip at least reaching the upper stomach. Right IJ line with tip at the upper cavoatrial junction. Cardiomegaly. There has been CABG and aortic valve repair. Pacer lead into the right  ventricle. Bilateral pneumonia. Worsened aeration at the left base where the diaphragm is no longer distinct. No evidence of air leak. These results will be called to the ordering clinician or representative by the Radiologist Assistant, and communication documented in the PACS or [employer]. IMPRESSION: 1. Lower endotracheal tube with tip at the carina. Consider retraction by approximately 3 cm. 2. Bilateral pneumonia with worsened aeration at the left base. Electronically Signed   By: Monte Fantasia M.D.   On: 02/23/2020 05:37     Assessment/Plan:  Enterococcus bacteremia.  Has a pacemaker and TAVR.  2D echo does not show any obvious valve involvement.  TEE is needed to rule out valvular endocarditis and vegetation on the pacemaker wire.  But currently too unstable to have the procedure.  So we will continue ampicillin and ceftriaxone.  Plan to give anyway for 6 weeks of treatment.  Also has Streptococcus gallolyticus in blood culture.  This raises a concern for colon cancer especially with Enterococcus also in the blood culture.  Acute hypoxic respiratory failure secondary to Covid intubated on 02/20/2020.  Completed remdesivir.  On steroids.   Myelodysplastic syndrome with severe pancytopenia.  Has received multiple PRBCs.  CKD  A. fib rate controlled.  Was on Eliquis which is on hold because of hematochezia and thrombocytopenia.   Discussed the management with the care team

## 2020-02-24 NOTE — Progress Notes (Signed)
Daily Progress Note   Patient Name: Evan [name]       Date: 02/24/2020 DOB: April 27, 1940  Age: 80 y.o. MRN#: [id number] Attending Physician: Flora Lipps, MD Primary Care Physician: Leonel Ramsay, MD Admit Date: 02/14/2020  Reason for Consultation/Follow-up: Establishing goals of care  Subjective: Patient remains intubated on ventilator.  Called spouse for followup Evan [name] discussion.  She shares she has just left the hospital. She asked questions about why patient is still on ventilator.  Discussed with her how sick patient currently is. Discussed how ill patient was prior to admission.  Spouse asked "what if can't come off ventilator"- options of what continued aggressive care would look like (trach, PEG, placement in facility) vs what transition to comfort and support through dying process would look like discussed. Spouse asked if patient would be able to come home if taken off of ventilator. She also asked if family would be allowed to visit if he was transitioned to comfort measures.  We also discussed code status. Shared with spouse my concern that if we proceeded with CPR despite all current interventions- that we would be hurting him more than helping him and all evidence based practice indicates it would not be successful in changing his current trajectory. Evan [name] understandably emotional- stated "I just can't let go, but I don't want him to suffer".  She wished for update from attending physician and also stated she would think about DNR.  She agreed to meet in person tomorrow morning.    Review of Systems  Unable to perform ROS: Intubated    Length of Stay: 9  Current Medications: Scheduled Meds:  . allopurinol  100 mg Oral Daily  . chlorhexidine gluconate (MEDLINE KIT)   15 mL Mouth Rinse BID  . Chlorhexidine Gluconate Cloth  6 each Topical Daily  . docusate  100 mg Per Tube BID  . feeding supplement (PROSource TF)  90 mL Per Tube QID  . feeding supplement (VITAL AF 1.2 CAL)  1,000 mL Per Tube Q24H  . free water  200 mL Per Tube Q4H  . insulin aspart  0-20 Units Subcutaneous Q4H  . lactulose  20 g Per Tube BID  . mouth rinse  15 mL Mouth Rinse 10 times per day  . methylPREDNISolone (SOLU-MEDROL) injection  20 mg Intravenous Q12H  .  multivitamin with minerals  1 tablet Oral Daily  . pantoprazole (PROTONIX) IV  40 mg Intravenous Q24H  . polyethylene glycol  17 g Per Tube Daily  . tamsulosin  0.4 mg Oral Daily    Continuous Infusions: . sodium chloride    . ampicillin (OMNIPEN) IV 2 g (02/24/20 1230)  . cefTRIAXone (ROCEPHIN)  IV 2 g (02/24/20 0927)  . fentaNYL infusion INTRAVENOUS Stopped (02/23/20 1428)  . propofol (DIPRIVAN) infusion 20 mcg/kg/min (02/24/20 1305)    PRN Meds: acetaminophen **OR** acetaminophen, fentaNYL (SUBLIMAZE) injection, midazolam, ondansetron **OR** ondansetron (ZOFRAN) IV, polyethylene glycol  Physical Exam Vitals and nursing note reviewed.             Vital Signs: BP (!) 86/56   Pulse 94   Temp 100.1 F (37.8 C) (Oral)   Resp 17   Ht 6' (1.829 m)   Wt 109.9 kg   SpO2 (!) 89%   BMI 32.86 kg/m  SpO2: SpO2: (!) 89 % O2 Device: O2 Device: Ventilator O2 Flow Rate: O2 Flow Rate (L/min): 55 L/min  Intake/output summary:   Intake/Output Summary (Last 24 hours) at 02/24/2020 1332 Last data filed at 02/24/2020 1100 Gross per 24 hour  Intake 858.57 ml  Output 2325 ml  Net -1466.43 ml   LBM: Last BM Date: 02/24/20 Baseline Weight: Weight: 104 kg Most recent weight: Weight: 109.9 kg       Palliative Assessment/Data: PPS: 10%    Flowsheet Rows   Flowsheet Row Most Recent Value  Intake Tab   Referral Department Hospitalist  Unit at Time of Referral Intermediate Care Unit  Palliative Care Primary Diagnosis  Pulmonary  Date Notified 02/16/2020  Palliative Care Type Return patient Palliative Care  Reason for referral Clarify Goals of Care  Date of Admission 02/11/2020  Date first seen by Palliative Care 02/18/20  # of days Palliative referral response time 3 Day(s)  # of days IP prior to Palliative referral 0  Clinical Assessment   Palliative Performance Scale Score 20%  Pain Max last 24 hours Not able to report  Pain Min Last 24 hours Not able to report  Dyspnea Max Last 24 Hours Not able to report  Dyspnea Min Last 24 hours Not able to report  Psychosocial & Spiritual Assessment   Palliative Care Outcomes       Patient Active Problem List   Diagnosis Date Noted  . Sepsis ([place]) 02/20/2020  . Hypokalemia   . Pressure injury of skin 02/16/2020  . Severe sepsis with septic shock ([place])   . Lactic acidosis   . Acute respiratory failure with hypoxia ([place])   . Enterococcal bacteremia   . Gout flare 02/07/2020  . Acute respiratory failure due to COVID-19 ([place]) 02/20/2020  . Acute metabolic encephalopathy 54/65/6812  . Elevated troponin 02/16/2020  . Chronic anticoagulation 02/05/2020  . Generalized weakness 02/03/2020  . Hypernatremia 02/17/2020  . Hyperammonemia ([place]) 02/10/2020  . Palliative care encounter   . Effusion of right knee 01/27/2020  . Chronic systolic CHF (congestive heart failure) ([place]) 01/27/2020  . Acute on chronic diastolic heart failure ([place]) 01/26/2020  . Goals of care, counseling/discussion 12/17/2019  . MDS (myelodysplastic syndrome) ([place]) 12/16/2019  . Acute on chronic heart failure with preserved ejection fraction (HFpEF) ([place]) 12/10/2019  . AKI (acute kidney injury) ([place]) 12/10/2019  . Cirrhosis ([place])   . Symptomatic anemia 12/07/2019  . Macrocytic anemia 04/16/2019  . S/P TAVR (transcatheter aortic valve replacement)   . Bronchitis 04/17/2017  . Severe aortic  stenosis 03/23/2017  . Atrial fibrillation, permanent ([place]) 03/23/2017  . Peripheral vision loss,  bilateral   . Osteoarthritis   . Hypertension   . HOH (hard of hearing)   . Heart murmur   . Frequency of urination   . Dyspnea   . Bruises easily   . Diabetes mellitus type 2, uncomplicated ([place]) 39/68/8648  . Essential hypertension 10/15/2015  . 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 Diarrhea 08/24/2015  . Stroke ([place]) 07/25/2015  . Edema of leg 03/20/2015  . Bradycardia 03/20/2015  . Nocturia 10/24/2014  . Aftercare following surgery of the circulatory system, [place] 06/07/2012  . Occlusion and stenosis of carotid artery without mention of cerebral infarction 04/14/2011  . Shoulder pain 04/14/2011  . 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 Diabetes mellitus ([place]) 02/01/2011  . Coronary artery disease   . Hyperlipidemia   . Hypertensive urgency   . Obesity   . Carotid arterial disease ([place])   . Torn rotator cuff 01/24/2010    Palliative Care Assessment & Plan   Patient Profile:  80 y.o. 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 fib on Eliquis, type 2 diabetes, HTN, CKD 3a,cirrhosis andgout admitted on 02/10/2020 with septic shock, acute on chronic resp failure 2/2 covid pne. Now intubated.  Assessment/Recommendations/Plan   Meeting with spouse tomorrow at 49 for further [place] and code status discussion  Secure chat with Dr. Mortimer Fries with spouse's request for phone update  Goals of Care and Additional Recommendations:  Limitations on Scope of Treatment: Full Scope Treatment  Code Status:  Full code  Prognosis:   Unable to determine  Discharge Planning:  To Be Determined  Care plan was discussed with spouse and Dr. Mortimer Fries.   Thank you for allowing the Palliative Medicine Team to assist in the care of this patient.   Total time: 43  mins Greater than 50%  of this time was spent counseling and coordinating care related to the above assessment and plan.  Evan [name], AGNP-C Palliative Medicine   Please contact Palliative Medicine Team phone at [phone] for questions and concerns.

## 2020-02-24 NOTE — Consult Note (Signed)
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 He was admitted with acute respiratory failure secondary to COVID pneumonia, AKI which has improved, and enterococcal bacteremia. Patient was intubated on 1/27 and placed on vent. Pharmacy has been consulted for electrolyte monitoring and replacement.   Goal of Therapy:  Electrolytes WNL   Plan:  --Na 147, continue free water flushes 200 mL q4h (1.2 L/day) --Pharmacy will continue to monitor and evaluate electrolytes.     Benita Gutter 02/21/2020

## 2020-02-24 NOTE — Progress Notes (Signed)
Uneventful day. Wife very clingy and unhappy that Dr Mortimer Fries did not come while she was visiting. Finally instructed patient to not touch patient momentarily to achieve proper sedation. Wife not happy with RN.

## 2020-02-24 NOTE — Progress Notes (Signed)
CRITICAL CARE NOTE 80 year old male presenting to the ED with poor p.o. take confusion and generalized weakness to the point of being unable to ambulate. COVID 19 + sCHF+Liver cirrhosis  ED course: On initial vitals patient was afebrile,BP 119/76,HR 66,tachypneic in the 20'swith SPO2 at 100% on 4 L nasal cannula.Labs significant [id number] 5.7(was 8.1-11 days prior),platelets 46,000(reduced from 69,000),sodium 153,creatinine 1.77(prior 1.16),ammonia 38,troponin 210,BNP 2000,COVID PCR positive.CT head showed no acute abnormalities.Patient was started on a blood transfusion 2 units PRBCs.Hospitalist service was consulted for admission. Patient became hypoxic requiring 6 L nasal cannula.BP also dropped with pressures in the high [id number] low 0000000 systolically maps [id number].Lactic increased from 2.1-6.6.Sepsis criteria initiated,PCCM consulted for further management and monitoring due to worsening respiratory status and possible impending shock.  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 CAD CHF-recent EF 45 to 50%,with global hypokinesis in the left ventricle HLD HTN A-Fibon Eliquis CVA T2DM Cirrhosis CKD Stage 3b TAVR Gout  Significant Hospital Events:  02/24/2020 Admit to SDU +ENTEROBACTERMIA 01/23/22CVC placed,patient transitionedto heated high flow nasal cannula 02/20/20 Intubated, 8.0 ETT 1/31 failure to wean from vent, severe resp failure Consults:  PCCM  Procedures:  02/16/2020 CVC 3L RIJ>> 02/20/20 Intubation  Significant Diagnostic Tests:  02/21/2020 CT head>>no acute intracranial abnormalities,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-similar to prior examinations  Micro Data:  02/10/2020 COVID-19>>positive 02/16/2020 BC x2>> E faecalis  Antimicrobials:  02/02/2020 cefepime 1/22-1/23/2022 metronidazole 1/22-1/23/2022 vancomycin 1/22-1/27/2022 remdesivir 02/16/2020 -  present ceftriaxone 02/16/2020 - present ampicillin      CC  follow up respiratory failure  SUBJECTIVE Patient remains critically ill Prognosis is guarded Severe resp failure  Vent Mode: PRVC FiO2 (%):  [28 %-30 %] 28 % Set Rate:  [18 bmp] 18 bmp Vt Set:  [500 mL] 500 mL PEEP:  [5 cmH20] 5 cmH20 Plateau Pressure:  [8 cmH20-19 cmH20] 19 cmH20 \ CBC    Component Value Date/Time   WBC 8.9 02/24/2020 0610   RBC 2.79 (L) 02/24/2020 0610   HGB 8.4 (L) 02/24/2020 0610   HGB 7.6 (L) 01/29/2020 1115   HCT 25.9 (L) 02/24/2020 0610   HCT 19.6 (L) 12/06/2019 1630   PLT 34 (L) 02/24/2020 0610   PLT 287 12/06/2019 1630   MCV 92.8 02/24/2020 0610   MCV 107 (H) 12/06/2019 1630   MCH 30.1 02/24/2020 0610   MCHC 32.4 02/24/2020 0610   RDW 24.2 (H) 02/24/2020 0610   RDW 16.9 (H) 12/06/2019 1630   LYMPHSABS 0.4 (L) 02/16/2020 1525   LYMPHSABS 1.0 03/02/2018 1205   MONOABS 0.1 02/16/2020 1525   EOSABS 0.0 02/16/2020 1525   EOSABS 0.2 03/02/2018 1205   BASOSABS 0.0 02/16/2020 1525   BASOSABS 0.0 03/02/2018 1205   BMP Latest Ref Rng & Units 02/23/2020 02/22/2020 02/21/2020  Glucose 70 - 99 mg/dL 348(H) 130(H) 161(H)  BUN 8 - 23 mg/dL 91(H) 69(H) 63(H)  Creatinine 0.61 - 1.24 mg/dL 2.03(H) 1.63(H) 1.49(H)  BUN/Creat Ratio 10 - 24 - - -  Sodium 135 - 145 mmol/L 143 150(H) 147(H)  Potassium 3.5 - 5.1 mmol/L 4.0 4.3 4.1  Chloride 98 - 111 mmol/L 106 109 109  CO2 22 - 32 mmol/L 26 29 28   Calcium 8.9 - 10.3 mg/dL 7.5(L) 7.6(L) 7.8(L)     BP 133/63   Pulse 82   Temp (!) 97.2 F (36.2 C) (Axillary)   Resp (!) 22   Ht 6' (1.829 m)   Wt 109.9 kg   SpO2 90%   BMI  32.86 kg/m    I/O last 3 completed shifts: In: 4056.2 [I.V.:1116.2; NG/GT:2340; IV Piggyback:600] Out: 4500 [Urine:4370; Emesis/NG output:130] No intake/output data recorded.  SpO2: 90 % O2 Flow Rate (L/min): 55 L/min FiO2 (%): 28 %  Estimated body mass index is 32.86 kg/m as calculated from the following:    Height as of this encounter: 6' (1.829 m).   Weight as of this encounter: 109.9 kg.  SIGNIFICANT EVENTS   REVIEW OF SYSTEMS  PATIENT IS UNABLE TO PROVIDE COMPLETE REVIEW OF SYSTEMS DUE TO SEVERE CRITICAL ILLNESS       PHYSICAL EXAMINATION: GENERAL:critically ill appearing, +resp distress NECK: Supple.  PULMONARY: +rhonchi, +wheezing CARDIOVASCULAR: S1 and S2.  GASTROINTESTINAL: Soft, nontender, +Positive bowel sounds.  MUSCULOSKELETAL: No swelling, clubbing, or edema.  NEUROLOGIC: obtunded, GCS<8 SKIN:intact,warm,dry    Patient assessed or the symptoms described in the history of present illness.  In the context of the Global COVID-19 pandemic, which necessitated consideration that the patient might be at risk for infection with the SARS-CoV-2 virus that causes COVID-19, Institutional protocols and algorithms that pertain to the evaluation of patients at risk for COVID-19 are in a state of rapid change based on information released by regulatory bodies including the CDC and federal and state organizations. These policies and algorithms were followed during the patient's care while in hospital.    MEDICATIONS: I have reviewed all medications and confirmed regimen as documented   CULTURE RESULTS   Recent Results (from the past 240 hour(s))  SARS Coronavirus 2 by RT PCR (hospital order, performed in [facility] hospital lab) Nasopharyngeal Nasopharyngeal Swab     Status: Abnormal   Collection Time: 02/14/2020  7:07 PM   Specimen: Nasopharyngeal Swab  Result Value Ref Range Status   SARS Coronavirus 2 POSITIVE (A) NEGATIVE Final    Comment: RESULT CALLED TO, READ BACK BY AND VERIFIED WITH: CHRISTINE HALLAS AT 2050 02/05/2020. MF (NOTE) SARS-CoV-2 target nucleic acids are DETECTED  SARS-CoV-2 RNA is generally detectable in upper respiratory specimens  during the acute phase of infection.  Positive results are indicative  of the presence of the identified virus, but do not rule  out bacterial infection or co-infection with other pathogens not detected by the test.  Clinical correlation with patient history and  other diagnostic information is necessary to determine patient infection status.  The expected result is negative.  Fact Sheet for Patients:   StrictlyIdeas.no   Fact Sheet for Healthcare Providers:   BankingDealers.co.za    This test is not yet approved or cleared by the [place] FDA and  has been authorized for detection and/or diagnosis of SARS-CoV-2 by FDA under an Emergency Use Authorization (EUA).  This EUA will remain in effect (meaning this  test can be used) for the duration of  the COVID-19 declaration under Section 564(b)(1) of the Act, 21 U.S.C. section 360-bbb-3(b)(1), unless the authorization is terminated or revoked sooner.  Performed at [facility], [address] [place], [place] [address]   CULTURE, BLOOD (ROUTINE X 2) w Reflex to ID Panel     Status: Abnormal   Collection Time: 02/16/20  2:11 AM   Specimen: BLOOD  Result Value Ref Range Status   Specimen Description   Final    BLOOD LEFT ANTECUBITAL Performed at [facility], [address] [place], [place] [address]    Special Requests   Final    BOTTLES DRAWN AEROBIC AND ANAEROBIC Blood Culture results may not be optimal due to an  excessive volume of blood received in culture bottles Performed at [facility], [address] [place], [place] [address]    Culture  Setup Time   Final    IN BOTH AEROBIC AND ANAEROBIC BOTTLES GRAM POSITIVE COCCI CRITICAL VALUE NOTED.  VALUE IS CONSISTENT WITH PREVIOUSLY REPORTED AND CALLED VALUE. Performed at [facility], [address], [place], [place] [address]    Culture (A)  Final    ENTEROCOCCUS FAECALIS SUSCEPTIBILITIES PERFORMED ON PREVIOUS CULTURE WITHIN THE LAST 5 DAYS. STREPTOCOCCUS GALLOLYTICUS THE SIGNIFICANCE OF ISOLATING THIS  ORGANISM FROM A SINGLE SET OF BLOOD CULTURES WHEN MULTIPLE SETS ARE DRAWN IS UNCERTAIN. PLEASE NOTIFY THE MICROBIOLOGY DEPARTMENT WITHIN ONE WEEK IF SPECIATION AND SENSITIVITIES ARE REQUIRED. Performed at [facility], [place] [address], [place], [place] [address]    Report Status 02/21/2020 FINAL  Final  CULTURE, BLOOD (ROUTINE X 2) w Reflex to ID Panel     Status: Abnormal   Collection Time: 02/16/20  2:11 AM   Specimen: BLOOD  Result Value Ref Range Status   Specimen Description   Final    BLOOD BLOOD RIGHT FOREARM Performed at [facility], [address], [place], [place] [address]    Special Requests   Final    BOTTLES DRAWN AEROBIC AND ANAEROBIC Blood Culture adequate volume Performed at [facility], [address] [place], [place] [address]    Culture  Setup Time   Final    IN BOTH AEROBIC AND ANAEROBIC BOTTLES GRAM POSITIVE COCCI CRITICAL RESULT CALLED TO, READ BACK BY AND VERIFIED WITH: BRANDON BEERS 02/16/20 AT 1229 BY ACR Performed at [facility], [place] [address] [place], [place] [address]    Culture ENTEROCOCCUS FAECALIS (A)  Final   Report Status 02/18/2020 FINAL  Final   Organism ID, Bacteria ENTEROCOCCUS FAECALIS  Final      Susceptibility   Enterococcus faecalis - MIC*    AMPICILLIN <=2 SENSITIVE Sensitive     VANCOMYCIN 2 SENSITIVE Sensitive     GENTAMICIN SYNERGY SENSITIVE Sensitive     * ENTEROCOCCUS FAECALIS  Blood Culture ID Panel (Reflexed)     Status: Abnormal   Collection Time: 02/16/20  2:11 AM  Result Value Ref Range Status   Enterococcus faecalis DETECTED (A) NOT DETECTED Final    Comment: CRITICAL RESULT CALLED TO, READ BACK BY AND VERIFIED WITH: BRANDON BEERS 02/16/20 AT 1229 BY ACR    Enterococcus Faecium NOT DETECTED NOT DETECTED Final   Listeria monocytogenes NOT DETECTED NOT DETECTED Final   Staphylococcus species NOT DETECTED NOT DETECTED Final   Staphylococcus aureus (BCID) NOT DETECTED NOT DETECTED Final    Staphylococcus epidermidis NOT DETECTED NOT DETECTED Final   Staphylococcus lugdunensis NOT DETECTED NOT DETECTED Final   Streptococcus species NOT DETECTED NOT DETECTED Final   Streptococcus agalactiae NOT DETECTED NOT DETECTED Final   Streptococcus pneumoniae NOT DETECTED NOT DETECTED Final   Streptococcus pyogenes NOT DETECTED NOT DETECTED Final   A.calcoaceticus-baumannii NOT DETECTED NOT DETECTED Final   Bacteroides fragilis NOT DETECTED NOT DETECTED Final   Enterobacterales NOT DETECTED NOT DETECTED Final   Enterobacter cloacae complex NOT DETECTED NOT DETECTED Final   Escherichia coli NOT DETECTED NOT DETECTED Final   Klebsiella aerogenes NOT DETECTED NOT DETECTED Final   Klebsiella oxytoca NOT DETECTED NOT DETECTED Final   Klebsiella pneumoniae NOT DETECTED NOT DETECTED Final   Proteus species NOT DETECTED NOT DETECTED Final   Salmonella species NOT DETECTED NOT DETECTED Final  Serratia marcescens NOT DETECTED NOT DETECTED Final   Haemophilus influenzae NOT DETECTED NOT DETECTED Final   Neisseria meningitidis NOT DETECTED NOT DETECTED Final   Pseudomonas aeruginosa NOT DETECTED NOT DETECTED Final   Stenotrophomonas maltophilia NOT DETECTED NOT DETECTED Final   Candida albicans NOT DETECTED NOT DETECTED Final   Candida auris NOT DETECTED NOT DETECTED Final   Candida glabrata NOT DETECTED NOT DETECTED Final   Candida krusei NOT DETECTED NOT DETECTED Final   Candida parapsilosis NOT DETECTED NOT DETECTED Final   Candida tropicalis NOT DETECTED NOT DETECTED Final   Cryptococcus neoformans/gattii NOT DETECTED NOT DETECTED Final   Vancomycin resistance NOT DETECTED NOT DETECTED Final    Comment: Performed at [facility], [address] [place], [place] [address]  CULTURE, BLOOD (ROUTINE X 2) w Reflex to ID Panel     Status: None   Collection Time: 02/17/20  2:16 AM   Specimen: BLOOD  Result Value Ref Range Status   Specimen Description BLOOD BLOOD LEFT HAND  Final    Special Requests   Final    BOTTLES DRAWN AEROBIC AND ANAEROBIC Blood Culture adequate volume   Culture   Final    NO GROWTH 5 DAYS Performed at [facility], [address] [place], [place] [address]    Report Status 02/22/2020 FINAL  Final  CULTURE, BLOOD (ROUTINE X 2) w Reflex to ID Panel     Status: None   Collection Time: 02/17/20  2:16 AM   Specimen: BLOOD  Result Value Ref Range Status   Specimen Description BLOOD BLOOD RIGHT HAND  Final   Special Requests   Final    BOTTLES DRAWN AEROBIC AND ANAEROBIC Blood Culture adequate volume   Culture   Final    NO GROWTH 5 DAYS Performed at [facility], [address], [place], [place] [address]    Report Status 02/22/2020 FINAL  Final  Urine Culture     Status: None   Collection Time: 02/17/20  7:15 AM   Specimen: Urine, Random  Result Value Ref Range Status   Specimen Description   Final    URINE, RANDOM Performed at [facility], [address] [place], [place] [address]    Special Requests   Final    NONE Performed at [facility], [address] [place], [place] [address]    Culture   Final    NO GROWTH Performed at [facility], [place] [address] [place], [place] [address]    Report Status 02/18/2020 FINAL  Final  MRSA PCR Screening     Status: None   Collection Time: 02/17/20  7:15 AM  Result Value Ref Range Status   MRSA by PCR NEGATIVE NEGATIVE Final    Comment:        The GeneXpert MRSA Assay (FDA approved for NASAL specimens only), is one component of a comprehensive MRSA colonization surveillance program. It is not intended to diagnose MRSA infection nor to guide or monitor treatment for MRSA infections. Performed at [facility], [address], [place], [place] [address]        Nutrition Status: Nutrition Problem: Increased nutrient needs Etiology: catabolic illness (COVID 19, CHF, Myelodysplastic syndrome, cirrhosis) Signs/Symptoms:  estimated needs       Indwelling Urinary Catheter continued, requirement due to   Reason to continue Indwelling Urinary Catheter strict Intake/Output monitoring for hemodynamic instability   Central Line/ continued, requirement due to  Reason to continue [employer] of  central venous pressure or other hemodynamic parameters and poor IV access   Ventilator continued, requirement due to severe respiratory failure   Ventilator Sedation RASS 0 to -2      ASSESSMENT AND PLAN SYNOPSIS ADMITTED  FOR  Severe ACUTE Hypoxic and Hypercapnic Respiratory Failure secondary to COVID pneumonia   Acute hypoxemic respiratory failure due to COVID-19 pneumonia / ARDS Mechanical ventilation via ARDS protocol, target PRVC 6 cc/kg Wean PEEP and FiO2 as able VAP prevention order set Remdesivir  IV STEROIDS     Severe ACUTE Hypoxic and Hypercapnic Respiratory Failure -continue Full MV support -continue Bronchodilator Therapy -Wean Fio2 and PEEP as tolerated -VAP/VENT bundle implementation  ACUTE SYSTOLIC CARDIAC FAILURE- EF -oxygen as needed  Morbid obesity, possible OSA.   Will certainly impact respiratory mechanics, ventilator weaning Suspect will need to consider additional PEEP   ACUTE KIDNEY INJURY/Renal Failure -continue Foley Catheter-assess need -Avoid nephrotoxic agents -Follow urine output, BMP -Ensure adequate renal perfusion, optimize oxygenation -Renal dose medications     NEUROLOGY Acute toxic metabolic encephalopathy, need for sedation Goal RASS -2 to -3 Assess neuro status   SHOCK-SEPSIS due to E faecalis bacteremia -use vasopressors to keep MAP>65 -follow ABG and LA -follow up cultures -emperic ABX -consider stress dose steroids -aggressive IV fluid resuscitation  CARDIAC ICU monitoring  INFECTIOUS DISEASE -continue antibiotics as prescribed -follow up cultures -follow up ID consultation Blood cultures positive for E. Faecalis -Per ID,  continue rocephin and ampicillin -initial TTE negative for vegetations, awaiting TEE -Levophed if MAP <65    GI GI PROPHYLAXIS as indicated   DIET-->TF's as tolerated Constipation protocol as indicated  ENDO - will use ICU hypoglycemic\Hyperglycemia protocol if indicated     ELECTROLYTES -follow labs as needed -replace as needed -pharmacy consultation and following   DVT/GI PRX ordered and assessed TRANSFUSIONS AS NEEDED MONITOR FSBS I Assessed the need for Labs I Assessed the need for Foley I Assessed the need for Central Venous Line Family Discussion when available I Assessed the need for Mobilization I made an Assessment of medications to be adjusted accordingly Safety Risk assessment completed   CASE DISCUSSED IN MULTIDISCIPLINARY ROUNDS WITH ICU TEAM  Critical Care Time devoted to patient care services described in this note is 55  minutes.   Overall, patient is critically ill, prognosis is guarded.  Patient with Multiorgan failure and at high risk for cardiac arrest and death.    Corrin Parker, M.D.  [facility]  Medical Director [place] Director [facility] Cardio-Pulmonary Department

## 2020-02-25 DIAGNOSIS — B952 Enterococcus as the cause of diseases classified elsewhere: Secondary | ICD-10-CM | POA: Diagnosis not present

## 2020-02-25 DIAGNOSIS — G9341 Metabolic encephalopathy: Secondary | ICD-10-CM | POA: Diagnosis not present

## 2020-02-25 DIAGNOSIS — R7881 Bacteremia: Secondary | ICD-10-CM | POA: Diagnosis not present

## 2020-02-25 DIAGNOSIS — Z7189 Other specified counseling: Secondary | ICD-10-CM | POA: Diagnosis not present

## 2020-02-25 DIAGNOSIS — U071 COVID-19: Secondary | ICD-10-CM | POA: Diagnosis not present

## 2020-02-25 LAB — GLUCOSE, CAPILLARY
Glucose-Capillary: 210 mg/dL — ABNORMAL HIGH (ref 70–99)
Glucose-Capillary: 242 mg/dL — ABNORMAL HIGH (ref 70–99)
Glucose-Capillary: 253 mg/dL — ABNORMAL HIGH (ref 70–99)
Glucose-Capillary: 246 mg/dL — ABNORMAL HIGH (ref 70–99)
Glucose-Capillary: 303 mg/dL — ABNORMAL HIGH (ref 70–99)

## 2020-02-25 LAB — CBC
HCT: 21.4 % — ABNORMAL LOW (ref 39.0–52.0)
Hemoglobin: 6.7 g/dL — ABNORMAL LOW (ref 13.0–17.0)
MCH: 30 pg (ref 26.0–34.0)
MCHC: 31.3 g/dL (ref 30.0–36.0)
MCV: 96 fL (ref 80.0–100.0)
Platelets: 27 10*3/uL — CL (ref 150–400)
RBC: 2.23 MIL/uL — ABNORMAL LOW (ref 4.22–5.81)
RDW: 23.9 % — ABNORMAL HIGH (ref 11.5–15.5)
WBC: 5.7 10*3/uL (ref 4.0–10.5)
nRBC: 2.8 % — ABNORMAL HIGH (ref 0.0–0.2)

## 2020-02-25 LAB — C-REACTIVE PROTEIN: CRP: 1.1 mg/dL — ABNORMAL HIGH (ref <1.0)

## 2020-02-25 LAB — FERRITIN: Ferritin: 864 ng/mL — ABNORMAL HIGH (ref 24–336)

## 2020-02-25 LAB — BASIC METABOLIC PANEL
Anion gap: 13 (ref 5–15)
BUN: 92 mg/dL — ABNORMAL HIGH (ref 8–23)
CO2: 27 mmol/L (ref 22–32)
Calcium: 7.6 mg/dL — ABNORMAL LOW (ref 8.9–10.3)
Chloride: 108 mmol/L (ref 98–111)
Creatinine, Ser: 1.62 mg/dL — ABNORMAL HIGH (ref 0.61–1.24)
GFR, Estimated: 43 mL/min — ABNORMAL LOW (ref >60)
Glucose, Bld: 277 mg/dL — ABNORMAL HIGH (ref 70–99)
Potassium: 3.9 mmol/L (ref 3.5–5.1)
Sodium: 148 mmol/L — ABNORMAL HIGH (ref 135–145)

## 2020-02-25 LAB — PHOSPHORUS: Phosphorus: 3.5 mg/dL (ref 2.5–4.6)

## 2020-02-25 LAB — FIBRIN DERIVATIVES D-DIMER (ARMC ONLY): Fibrin derivatives D-dimer (ARMC): 1212.16 ng/mL (FEU) — ABNORMAL HIGH (ref 0.00–499.00)

## 2020-02-25 LAB — MAGNESIUM: Magnesium: 2.6 mg/dL — ABNORMAL HIGH (ref 1.7–2.4)

## 2020-02-25 MED ORDER — DEXAMETHASONE 4 MG PO TABS
4.0000 mg | ORAL_TABLET | Freq: Four times a day (QID) | ORAL | Status: DC
  Administered 2020-02-25: 4 mg via ORAL
  Filled 2020-02-25 (×3): qty 1

## 2020-02-25 MED ORDER — FREE WATER
30.0000 mL | Status: DC
  Administered 2020-02-25 – 2020-02-26 (×5): 30 mL

## 2020-02-25 MED ORDER — PROSOURCE TF PO LIQD
90.0000 mL | Freq: Three times a day (TID) | ORAL | Status: DC
  Filled 2020-02-25 (×2): qty 90

## 2020-02-25 MED ORDER — NOREPINEPHRINE 16 MG/250ML-% IV SOLN
0.0000 ug/min | INTRAVENOUS | Status: DC
  Administered 2020-02-25: 2 ug/min via INTRAVENOUS
  Filled 2020-02-25: qty 250

## 2020-02-25 MED ORDER — SODIUM CHLORIDE 0.9% IV SOLUTION: Freq: Once | INTRAVENOUS | Status: DC

## 2020-02-25 MED ORDER — VITAL 1.5 CAL PO LIQD
1000.0000 mL | ORAL | Status: DC
  Administered 2020-02-25: 1000 mL

## 2020-02-25 NOTE — Progress Notes (Signed)
Daily Progress Note   Patient Name: Evan [name]       Date: 02/25/2020 DOB: 1940-06-04  Age: 80 y.o. MRN#: [id number] Attending Physician: Flora Lipps, MD Primary Care Physician: Leonel Ramsay, MD Admit Date: 01/31/2020  Reason for Consultation/Follow-up: Establishing goals of care  Subjective: Met with patient's spouse.  She spoke with Dr. Mortimer Fries earlier and agreed to DNR.  We discussed what patient's goals would be.  She notes patient would not want to live in a nursing facility.  We discussed what a transition to full comfort measures only would look like and the prognosis of such.  She would like to have family visit and then extubate to comfort. If patient looks to survive extubation she would like to bring him home and understands it would be end of life care.   Review of Systems  Unable to perform ROS: Intubated    Length of Stay: 10  Current Medications: Scheduled Meds:  . sodium chloride   Intravenous Once  . allopurinol  100 mg Oral Daily  . chlorhexidine gluconate (MEDLINE KIT)  15 mL Mouth Rinse BID  . Chlorhexidine Gluconate Cloth  6 each Topical Daily  . docusate  100 mg Per Tube BID  . feeding supplement (PROSource TF)  90 mL Per Tube QID  . feeding supplement (VITAL AF 1.2 CAL)  1,000 mL Per Tube Q24H  . free water  200 mL Per Tube Q4H  . insulin aspart  0-20 Units Subcutaneous Q4H  . lactulose  20 g Per Tube BID  . mouth rinse  15 mL Mouth Rinse 10 times per day  . multivitamin with minerals  1 tablet Oral Daily  . pantoprazole (PROTONIX) IV  40 mg Intravenous Q24H  . polyethylene glycol  17 g Per Tube Daily  . tamsulosin  0.4 mg Oral Daily    Continuous Infusions: . sodium chloride    . ampicillin (OMNIPEN) IV 2 g (02/25/20 0525)  . cefTRIAXone  (ROCEPHIN)  IV 2 g (02/25/20 0907)  . fentaNYL infusion INTRAVENOUS Stopped (02/23/20 1428)  . propofol (DIPRIVAN) infusion 20 mcg/kg/min (02/25/20 0526)    PRN Meds: acetaminophen **OR** acetaminophen, fentaNYL (SUBLIMAZE) injection, midazolam, ondansetron **OR** ondansetron (ZOFRAN) IV, polyethylene glycol  Physical Exam Vitals and nursing note reviewed.  Vital Signs: BP (!) 107/55   Pulse 72   Temp 97.8 F (36.6 C) (Oral)   Resp 18   Ht 6' (1.829 m)   Wt 109.5 kg   SpO2 100%   BMI 32.74 kg/m  SpO2: SpO2: 100 % O2 Device: O2 Device: Ventilator O2 Flow Rate: O2 Flow Rate (L/min): 55 L/min  Intake/output summary:   Intake/Output Summary (Last 24 hours) at 02/25/2020 1015 Last data filed at 02/25/2020 0400 Gross per 24 hour  Intake 1120 ml  Output 2775 ml  Net -1655 ml   LBM: Last BM Date: 02/25/20 Baseline Weight: Weight: 104 kg Most recent weight: Weight: 109.5 kg       Palliative Assessment/Data: PPS: 10%    Flowsheet Rows   Flowsheet Row Most Recent Value  Intake Tab   Referral Department Hospitalist  Unit at Time of Referral Intermediate Care Unit  Palliative Care Primary Diagnosis Pulmonary  Date Notified 02/08/2020  Palliative Care Type Return patient Palliative Care  Reason for referral Clarify Goals of Care  Date of Admission 02/22/2020  Date first seen by Palliative Care 02/18/20  # of days Palliative referral response time 3 Day(s)  # of days IP prior to Palliative referral 0  Clinical Assessment   Palliative Performance Scale Score 20%  Pain Max last 24 hours Not able to report  Pain Min Last 24 hours Not able to report  Dyspnea Max Last 24 Hours Not able to report  Dyspnea Min Last 24 hours Not able to report  Psychosocial & Spiritual Assessment   Palliative Care Outcomes       Patient Active Problem List   Diagnosis Date Noted  . Sepsis ([place]) 02/20/2020  . Hypokalemia   . Pressure injury of skin 02/16/2020  . Severe sepsis with  septic shock ([place])   . Lactic acidosis   . Acute respiratory failure with hypoxia ([place])   . Enterococcal bacteremia   . Gout flare 02/22/2020  . Acute respiratory failure due to COVID-19 ([place]) 02/14/2020  . Acute metabolic encephalopathy 40/98/1191  . Elevated troponin 02/24/2020  . Chronic anticoagulation 02/18/2020  . Generalized weakness 02/02/2020  . Hypernatremia 01/25/2020  . Hyperammonemia ([place]) 02/01/2020  . Palliative care encounter   . Effusion of right knee 01/27/2020  . Chronic systolic CHF (congestive heart failure) ([place]) 01/27/2020  . Acute on chronic diastolic heart failure ([place]) 01/26/2020  . Goals of care, counseling/discussion 12/17/2019  . MDS (myelodysplastic syndrome) ([place]) 12/16/2019  . Acute on chronic heart failure with preserved ejection fraction (HFpEF) ([place]) 12/10/2019  . AKI (acute kidney injury) ([place]) 12/10/2019  . Cirrhosis ([place])   . Symptomatic anemia 12/07/2019  . Macrocytic anemia 04/16/2019  . S/P TAVR (transcatheter aortic valve replacement)   . Bronchitis 04/17/2017  . Severe aortic stenosis 03/23/2017  . Atrial fibrillation, permanent ([place]) 03/23/2017  . Peripheral vision loss, bilateral   . Osteoarthritis   . Hypertension   . HOH (hard of hearing)   . Heart murmur   . Frequency of urination   . Dyspnea   . Bruises easily   . Diabetes mellitus type 2, uncomplicated ([place]) 47/82/9562  . Essential hypertension 10/15/2015  . Cerebral infarction due to stenosis of right posterior cerebral artery ([place]) 08/24/2015  . Diarrhea 08/24/2015  . Stroke ([place]) 07/25/2015  . Edema of leg 03/20/2015  . Bradycardia 03/20/2015  . Nocturia 10/24/2014  . Aftercare following surgery of the circulatory system, [place] 06/07/2012  . Occlusion and stenosis of carotid artery without mention  of cerebral infarction 04/14/2011  . Shoulder pain 04/14/2011  . 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 Diabetes mellitus ([place])  02/01/2011  . Coronary artery disease   . Hyperlipidemia   . Hypertensive urgency   . Obesity   . Carotid arterial disease ([place])   . Torn rotator cuff 01/24/2010    Palliative Care Assessment & Plan   Patient Profile: 80 y.o.malewith past medical history of myelodysplastic syndrome followed by oncology with frequent transfusions for severe anemia, systolic heart failure, history of TAVR, permanent A. fib on Eliquis, type 2 diabetes, HTN, CKD 3a,cirrhosis andgoutadmitted on 1/22/2022with septic shock, acute on chronic resp failure 2/2 covid pne. Now intubated.   Assessment/Recommendations/Plan   Per Dr. Mortimer Fries- patient meets criteria to allow for end of life visitation  Spouse looking forward to lightening sedation as she discussed with Dr. Mortimer Fries to try and communicate with her husband  Patient's spouse would like family to visit and then proceed with extubation   Goals of Care and Additional Recommendations:  Limitations on Scope of Treatment: Full Scope Treatment for now- with plans to eventually transition to full comfort  Code Status:  DNR  Prognosis:   Unable to determine  Discharge Planning:  To Be Determined  Care plan was discussed with patient's spouse.   Thank you for allowing the Palliative Medicine Team to assist in the care of this patient.   Total time:  62 mins Greater than 50%  of this time was spent counseling and coordinating care related to the above assessment and plan.  Mariana Kaufman, AGNP-C Palliative Medicine   Please contact Palliative Medicine Team phone at [phone] for questions and concerns.

## 2020-02-25 NOTE — Progress Notes (Signed)
Additional non face to face encounter time:   Met with patient's children in waiting area.  We discussed patient's overall prognosis and goals of care. His children state they are certain that Evan [name] would not want long term artificial support. They share that he has not been doing well for a while.  They note that patient's preferred quality of life would be at home, able to go outside with his dog and to spend meaningful time with his wife.  We discussed what withdrawal of artificial life support and transition to comfort measures would look like. All present are in support of extubation and comfort measures. They share that they are concerned that patient's spouse will change her mind tomorrow morning. They plan to meet and discuss with her this evening.  Also received call from patient's spouse. Emotional support given. She shares that she just wants her husband to wake up and squeeze her hand and talk with her. But she also doesn't want him suffering. She is not ready today for extubation and comfort today. I assured her that patient would not be extubated or any changes made in his care plan towards comfort until she is ready.   Mariana Kaufman, AGNP-C Palliative Medicine  Please call Palliative Medicine team phone with any questions [phone]. For individual providers please see AMION.  Total time: 62 minutes

## 2020-02-25 NOTE — Consult Note (Signed)
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 He was admitted with acute respiratory failure secondary to COVID pneumonia, AKI which has improved, and enterococcal bacteremia. Patient was intubated on 1/27 and placed on vent. Pharmacy has been consulted for electrolyte monitoring and replacement.   Goal of Therapy:  Electrolytes WNL   Plan:  --Na 148, continue free water flushes 200 mL q4h (1.2 L/day) --Pharmacy will continue to monitor and evaluate electrolytes.     Tawnya Crook, PharmD 02/21/2020

## 2020-02-25 NOTE — Progress Notes (Addendum)
0900 Patients sedation turned off per Dr. Mortimer Fries and wife's request. Patient slowly waking up. 1245 Patient very awake but not following commands or opening eyes.  Patient a synchronus with the ventilator. Propofol restarted, given Versed and Fentanyl to achieve synchropny with ventilator. Patient's ventilator stil;l alarming. RT called to help. Patient made DNR. Family in today to say good bye. Plan to go to [place] tomorrow

## 2020-02-25 NOTE — Progress Notes (Signed)
GOALS OF CARE DISCUSSION  The Clinical status was relayed to family in detail. Wife at Bedside  Updated and notified of patients medical condition.  Patient remains unresponsive and will not open eyes to command.    Patient is having a weak cough and struggling to remove secretions.   Patient with increased WOB and using accessory muscles to breathe Explained to family course of therapy and the modalities     Patient with Progressive multiorgan failure with a very high probablity of a very minimal chance of meaningful recovery despite all aggressive and optimal medical therapy. Patient is in the Dying  Process associated with Suffering.  Family understands the situation.  They have consented and agreed to DNR status  Family are satisfied with Plan of action and management. All questions answered  Additional CC time 32 mins   Leonardo Plaia Patricia Pesa, M.D.  [facility]  Medical Director Deputy Director [facility] Cardio-Pulmonary Department

## 2020-02-25 NOTE — Progress Notes (Signed)
Nutrition Follow Up Note   DOCUMENTATION CODES:   Obesity unspecified  INTERVENTION:   Change to Vital 1.5 '@60ml' /hr + Pro-Source 52m TID via tube  Free water flushes 2051mq4 hours per MD  Regimen provides 2400kcal/day, 163g/day protein and 230075may free water.   NUTRITION DIAGNOSIS:   Increased nutrient needs related to catabolic illness (COVID 19, CHF, Myelodysplastic syndrome, cirrhosis) as evidenced by estimated needs.  GOAL:   Provide needs based on ASPEN/SCCM guidelines  -met with tube feeds   MONITOR:   Vent status,Labs,Weight trends,Skin,I & O's  ASSESSMENT:   [age]o. 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 fib on Eliquis, type 2 diabetes, HTN, CKD 3a, cirrhosis and gout, recently hospitalized from 1/2-1/11 with respiratory failure secondary to systolic heart failure complicated by acute gout flare in right knee requiring arthrocentesis who presents to the emergency room with poor oral intake, confusion and protracted weakness and was found to have COVID 19   Pt remains sedated and ventilated. OGT in place. Pt tolerating tube feeds at goal rate. Will adjust tube feeds per COVID guidelines. Per chart, pt is up ~12lbs since admit. GOC pending.   Medications reviewed and include: allopurinol, colace, insulin, lactulose, MVI, protonix, miralax, omnipen, ceftriaxone  Labs reviewed: Na 148(H), K 3.9 wnl, BUN 92(H), creat 1.62(H), P 3.5 wnl, Mg 2.6(H) Hgb 6.7(L), Hct 21.4(L) cbgs- 210, 242 x 24hrs  Patient is currently intubated on ventilator support MV: 9.1 L/min Temp (24hrs), Avg:97.9 F (36.6 C), Min:97.2 F (36.2 C), Max:98.6 F (37 C)  Propofol: none  MAP- >52m79m UOP- 2775ml55met Order:   Diet Order            Diet NPO time specified  Diet effective now                EDUCATION NEEDS:   No  education needs have been identified at this time  Skin:  Skin Assessment: Reviewed RN Assessment (ecchymosis, Stage I sacrum)  Last BM:  2/1- type 5  Height:   Ht Readings from Last 1 Encounters:  02/11/2020 6' (1.829 m)    Weight:   Wt Readings from Last 1 Encounters:  02/25/20 109.5 kg    Ideal Body Weight:  80.9 kg  BMI:  Body mass index is 32.74 kg/m.  Estimated Nutritional Needs:   Kcal:  2300-2400kcal/day  Protein:  >160g/day  Fluid:  2.0L/day  CaseyKoleen DistanceRD, LDN Please refer to AMIONSouthern [facility] Cneter IncRD and/or RD on-call/weekend/after hours pager

## 2020-02-25 NOTE — Progress Notes (Signed)
CRITICAL CARE NOTE 80 year old male presenting to the ED with poor p.o. take confusion and generalized weakness to the point of being unable to ambulate. COVID 19 + sCHF+Liver cirrhosis  ED course: On initial vitals patient was afebrile,BP 119/76,HR 66,tachypneic in the 20'swith SPO2 at 100% on 4 L nasal cannula.Labs significant OFB:PZWCHENIDP 5.7(was 8.1-11 days prior),platelets 46,000(reduced from 69,000),sodium 153,creatinine 1.77(prior 1.16),ammonia 38,troponin 210,BNP 2000,COVID PCR positive.CT head showed no acute abnormalities.Patient was started on a blood transfusion 2 units PRBCs.Hospitalist service was consulted for admission. Patient became hypoxic requiring 6 L nasal cannula.BP also dropped with pressures in the high 82U low 23N systolically maps 36-14.Lactic increased from 2.1-6.6.Sepsis criteria initiated,PCCM consulted for further management and monitoring due to worsening respiratory status and possible impending shock.  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 CAD CHF-recent EF 45 to 50%,with global hypokinesis in the left ventricle HLD HTN A-Fibon Eliquis CVA T2DM Cirrhosis CKD Stage 3b TAVR Gout  Significant Hospital Events:  02/19/2020 Admit to SDU +ENTEROBACTERMIA 01/23/22CVC placed,patient transitionedto heated high flow nasal cannula 02/20/20 Intubated, 8.0 ETT 1/31 failure to wean from vent, severe resp failure Consults:  PCCM  Procedures:  02/16/2020 CVC 3L RIJ>> 02/20/20 Intubation  Significant Diagnostic Tests:  01/28/2020 CT head>>no acute intracranial abnormalities,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-similar to prior examinations  Micro Data:  02/14/2020 COVID-19>>positive 02/16/2020 BC x2>> E faecalis  Antimicrobials:  02/23/2020 cefepime 1/22-1/23/2022 metronidazole 1/22-1/23/2022 vancomycin 1/22-1/27/2022 remdesivir 02/16/2020 -  present ceftriaxone 02/16/2020 - present ampicillin    CC  follow up respiratory failure  SUBJECTIVE Patient remains critically ill Prognosis is guarded Severe resp failure   Vent Mode: PRVC FiO2 (%):  [28 %] 28 % Set Rate:  [18 bmp] 18 bmp Vt Set:  [500 mL] 500 mL PEEP:  [5 cmH20] 5 cmH20 Plateau Pressure:  [18 cmH20-21 cmH20] 18 cmH20  CBC    Component Value Date/Time   WBC 5.7 02/25/2020 0532   RBC 2.23 (L) 02/25/2020 0532   HGB 6.7 (L) 02/25/2020 0532   HGB 7.6 (L) 01/29/2020 1115   HCT 21.4 (L) 02/25/2020 0532   HCT 19.6 (L) 12/06/2019 1630   PLT 27 (LL) 02/25/2020 0532   PLT 287 12/06/2019 1630   MCV 96.0 02/25/2020 0532   MCV 107 (H) 12/06/2019 1630   MCH 30.0 02/25/2020 0532   MCHC 31.3 02/25/2020 0532   RDW 23.9 (H) 02/25/2020 0532   RDW 16.9 (H) 12/06/2019 1630   LYMPHSABS 0.4 (L) 02/16/2020 1525   LYMPHSABS 1.0 03/02/2018 1205   MONOABS 0.1 02/16/2020 1525   EOSABS 0.0 02/16/2020 1525   EOSABS 0.2 03/02/2018 1205   BASOSABS 0.0 02/16/2020 1525   BASOSABS 0.0 03/02/2018 1205   BMP Latest Ref Rng & Units 02/25/2020 02/24/2020 02/23/2020  Glucose 70 - 99 mg/dL 277(H) 245(H) 348(H)  BUN 8 - 23 mg/dL 92(H) 87(H) 91(H)  Creatinine 0.61 - 1.24 mg/dL 1.62(H) 1.65(H) 2.03(H)  BUN/Creat Ratio 10 - 24 - - -  Sodium 135 - 145 mmol/L 148(H) 147(H) 143  Potassium 3.5 - 5.1 mmol/L 3.9 4.1 4.0  Chloride 98 - 111 mmol/L 108 109 106  CO2 22 - 32 mmol/L 27 25 26   Calcium 8.9 - 10.3 mg/dL 7.6(L) 7.6(L) 7.5(L)   REVIEW OF SYSTEMS  PATIENT IS UNABLE TO PROVIDE COMPLETE REVIEW OF SYSTEM S DUE TO SEVERE CRITICAL ILLNESS AND ENCEPHALOPATHY  PHYSICAL EXAMINATION:  GENERAL:critically ill appearing, +resp distress HEAD: Normocephalic, atraumatic.  EYES: Pupils equal, round, reactive to light.  No scleral icterus.  MOUTH: Moist mucosal membrane. NECK: Supple. No thyromegaly. No nodules. No JVD.  PULMONARY: +rhonchi, +wheezing CARDIOVASCULAR: S1 and S2. Regular rate  and rhythm. No murmurs, rubs, or gallops.  GASTROINTESTINAL: Soft, nontender, -distended. Positive bowel sounds.  MUSCULOSKELETAL: No swelling, clubbing, or edema.  NEUROLOGIC: obtunded SKIN:intact,warm,dry      Indwelling Urinary Catheter continued, requirement due to   Reason to continue Indwelling Urinary Catheter strict Intake/Output monitoring for hemodynamic instability   Central Line/ continued, requirement due to  Reason to continue Ward of central venous pressure or other hemodynamic parameters and poor IV access   Ventilator continued, requirement due to severe respiratory failure   Ventilator Sedation RASS 0 to -2      ASSESSMENT AND PLAN SYNOPSIS ADMITTED  FOR  Severe ACUTE Hypoxic and Hypercapnic Respiratory Failure secondary to COVID pneumonia complicated by severe enterococcal bacteramia   Acute hypoxemic respiratory failure due to COVID-19 pneumonia / ARDS Mechanical ventilation via ARDS protocol, target PRVC 6 cc/kg Wean PEEP and FiO2 as able VAP prevention order set Remdesivir  IV STEROIDS     Severe ACUTE Hypoxic and Hypercapnic Respiratory Failure -continue Full MV support Assess weaning trials today when wife at bedside  [place]- EF -oxygen as needed  Morbid obesity, possible OSA.   Will certainly impact respiratory mechanics, ventilator weaning Suspect will need to consider additional PEEP   ACUTE KIDNEY INJURY/Renal Failure -continue Foley Catheter-assess need -Avoid nephrotoxic agents -Follow urine output, BMP -Ensure adequate renal perfusion, optimize oxygenation -Renal dose medications    NEUROLOGY Acute toxic metabolic encephalopathy, need for sedation WUA pending   SHOCK-SEPSIS due to E faecalis bacteremia -use vasopressors to keep MAP>65 as needed   CARDIAC ICU monitoring  INFECTIOUS DISEASE -continue antibiotics as prescribed -follow up cultures -follow up ID  consultation Blood cultures positive for E. Faecalis -Per ID, continue rocephin and ampicillin -initial TTE negative for vegetations, awaiting TEE -Levophed if MAP <65     GI GI PROPHYLAXIS as indicated  NUTRITIONAL STATUS DIET-->TF's as tolerated Constipation protocol as indicated  End stage LIVER CIRRHOSIS ?etiology Started lactulose     ELECTROLYTES -follow labs as needed -replace as needed -pharmacy consultation and following    DVT/GI PRX ordered and assessed TRANSFUSIONS AS NEEDED MONITOR FSBS I Assessed the need for Labs I Assessed the need for Foley I Assessed the need for Central Venous Line Family Discussion when available I Assessed the need for Mobilization I made an Assessment of medications to be adjusted accordingly Safety Risk assessment completed  CASE DISCUSSED IN MULTIDISCIPLINARY ROUNDS WITH ICU TEAM   Critical Care Time devoted to patient care services described in this note is 55 minutes.   Overall, patient is critically ill, prognosis is guarded.  Patient with Multiorgan failure and at high risk for cardiac arrest and death.   Recommend DNR status Patient with very poor chance of meaningful recovery    Corrin [place], M.D.  [facility]  Medical Director [place] Director [facility] Cardio-Pulmonary Department

## 2020-02-25 DEATH — deceased

## 2020-02-26 DIAGNOSIS — C946 Myelodysplastic disease, not classified: Secondary | ICD-10-CM | POA: Diagnosis not present

## 2020-02-26 DIAGNOSIS — G9341 Metabolic encephalopathy: Secondary | ICD-10-CM | POA: Diagnosis not present

## 2020-02-26 DIAGNOSIS — J9601 Acute respiratory failure with hypoxia: Secondary | ICD-10-CM | POA: Diagnosis not present

## 2020-02-26 DIAGNOSIS — R7881 Bacteremia: Secondary | ICD-10-CM | POA: Diagnosis not present

## 2020-02-26 DIAGNOSIS — Z7189 Other specified counseling: Secondary | ICD-10-CM | POA: Diagnosis not present

## 2020-02-26 DIAGNOSIS — U071 COVID-19: Secondary | ICD-10-CM | POA: Diagnosis not present

## 2020-02-26 LAB — CBC
HCT: 23.3 % — ABNORMAL LOW (ref 39.0–52.0)
Hemoglobin: 7.3 g/dL — ABNORMAL LOW (ref 13.0–17.0)
MCH: 30.4 pg (ref 26.0–34.0)
MCHC: 31.3 g/dL (ref 30.0–36.0)
MCV: 97.1 fL (ref 80.0–100.0)
Platelets: 35 10*3/uL — ABNORMAL LOW (ref 150–400)
RBC: 2.4 MIL/uL — ABNORMAL LOW (ref 4.22–5.81)
RDW: 24.3 % — ABNORMAL HIGH (ref 11.5–15.5)
WBC: 9.3 10*3/uL (ref 4.0–10.5)
nRBC: 3.1 % — ABNORMAL HIGH (ref 0.0–0.2)

## 2020-02-26 LAB — GLUCOSE, CAPILLARY
Glucose-Capillary: 344 mg/dL — ABNORMAL HIGH (ref 70–99)
Glucose-Capillary: 365 mg/dL — ABNORMAL HIGH (ref 70–99)

## 2020-02-26 MED ORDER — MORPHINE 100MG IN NS 100ML (1MG/ML) PREMIX INFUSION
0.0000 mg/h | INTRAVENOUS | Status: DC
  Administered 2020-02-26: 5 mg/h via INTRAVENOUS
  Administered 2020-02-26: 8 mg/h via INTRAVENOUS
  Administered 2020-02-27: 10 mg/h via INTRAVENOUS
  Filled 2020-02-26 (×3): qty 100

## 2020-02-26 MED ORDER — GLYCOPYRROLATE 0.2 MG/ML IJ SOLN
0.2000 mg | INTRAMUSCULAR | Status: DC | PRN
  Filled 2020-02-26: qty 1

## 2020-02-26 MED ORDER — MORPHINE BOLUS VIA INFUSION
2.0000 mg | INTRAVENOUS | Status: DC | PRN
  Administered 2020-02-26 – 2020-02-27 (×3): 2 mg via INTRAVENOUS
  Filled 2020-02-26: qty 4

## 2020-02-26 MED ORDER — HALOPERIDOL LACTATE 5 MG/ML IJ SOLN: 2.5000 mg | INTRAMUSCULAR | Status: DC | PRN

## 2020-02-26 MED ORDER — GLYCOPYRROLATE 1 MG PO TABS
1.0000 mg | ORAL_TABLET | ORAL | Status: DC | PRN
  Filled 2020-02-26: qty 1

## 2020-02-26 MED ORDER — POLYVINYL ALCOHOL 1.4 % OP SOLN
1.0000 [drp] | Freq: Four times a day (QID) | OPHTHALMIC | Status: DC | PRN
  Filled 2020-02-26: qty 15

## 2020-02-26 MED ORDER — GLYCOPYRROLATE 0.2 MG/ML IJ SOLN
0.2000 mg | INTRAMUSCULAR | Status: DC | PRN
  Administered 2020-02-26 – 2020-02-27 (×2): 0.2 mg via INTRAVENOUS
  Filled 2020-02-26: qty 1

## 2020-02-26 MED ORDER — MIDAZOLAM HCL 2 MG/2ML IJ SOLN
2.0000 mg | INTRAMUSCULAR | Status: DC | PRN
  Administered 2020-02-26 (×2): 2 mg via INTRAVENOUS
  Filled 2020-02-26 (×2): qty 2

## 2020-02-26 MED ORDER — DIPHENHYDRAMINE HCL 50 MG/ML IJ SOLN
25.0000 mg | INTRAMUSCULAR | Status: DC | PRN
Start: 2020-02-26 — End: 2020-02-27

## 2020-02-26 MED ORDER — FENTANYL CITRATE (PF) 100 MCG/2ML IJ SOLN
200.0000 ug | INTRAMUSCULAR | Status: DC | PRN
  Filled 2020-02-26: qty 4

## 2020-02-26 NOTE — Progress Notes (Signed)
Daily Progress Note   Patient Name: Evan [name]       Date: 02/26/2020 DOB: Nov 17, 1940  Age: 80 y.o. MRN#: [id number] Attending Physician: Flora Lipps, MD Primary Care Physician: Leonel Ramsay, MD Admit Date: 02/05/2020  Reason for Consultation/Follow-up: Establishing goals of care and Terminal Care  Subjective: Meeting held with patient's spouse, children and grandchildren. Answered questions related to patient's status and prognosis.  Family all agrees that patient would not want to spend the rest of his days in a debilitated state. They share that if it is unlikely that he will recover to a point where he can go home and be in his rocking chair with his dog, "Laddie Aquas". Then he would prefer to "go see God".  All are in agreement for extubation to comfort and to support through end of life process.   Review of Systems  Unable to perform ROS: Intubated    Length of Stay: 11  Current Medications: Scheduled Meds:  . chlorhexidine gluconate (MEDLINE KIT)  15 mL Mouth Rinse BID  . Chlorhexidine Gluconate Cloth  6 each Topical Daily  . mouth rinse  15 mL Mouth Rinse 10 times per day    Continuous Infusions: . sodium chloride    . fentaNYL infusion INTRAVENOUS Stopped (02/23/20 1428)  . propofol (DIPRIVAN) infusion 30 mcg/kg/min (02/26/20 0939)    PRN Meds: acetaminophen **OR** acetaminophen, diphenhydrAMINE, fentaNYL (SUBLIMAZE) injection, glycopyrrolate **OR** glycopyrrolate **OR** glycopyrrolate, haloperidol lactate, midazolam, ondansetron **OR** ondansetron (ZOFRAN) IV, polyvinyl alcohol  Physical Exam Vitals and nursing note reviewed.  Constitutional:      Appearance: He is ill-appearing.  Cardiovascular:     Rate and Rhythm: Normal rate.     Comments: Anasarca in  hands and lower extremities Skin:    Comments: Face has grey color appearance, ecchymosis on arms and hands  Neurological:     Comments: Unresponsive, intermittent nonpurposeful movements of upper extremities             Vital Signs: BP 136/63   Pulse 65   Temp 98.2 F (36.8 C) (Axillary)   Resp 19   Ht 6' (1.829 m)   Wt 109.5 kg   SpO2 100%   BMI 32.74 kg/m  SpO2: SpO2: 100 % O2 Device: O2 Device: Ventilator O2 Flow Rate: O2 Flow Rate (L/min): 55 L/min  Intake/output summary:   Intake/Output Summary (Last 24 hours) at 02/26/2020 1043 Last data filed at 02/26/2020 0800 Gross per 24 hour  Intake 4300.46 ml  Output 2075 ml  Net 2225.46 ml   LBM: Last BM Date: 02/26/20 Baseline Weight: Weight: 104 kg Most recent weight: Weight: 109.5 kg       Palliative Assessment/Data: PPS: 10%    Flowsheet Rows   Flowsheet Row Most Recent Value  Intake Tab   Referral Department Hospitalist  Unit at Time of Referral Intermediate Care Unit  Palliative Care Primary Diagnosis Pulmonary  Date Notified 01/30/2020  Palliative Care Type Return patient Palliative Care  Reason for referral Clarify Goals of Care  Date of Admission 02/22/2020  Date first seen by Palliative Care 02/18/20  # of days Palliative referral response time 3 Day(s)  # of days IP prior to Palliative referral 0  Clinical Assessment   Palliative Performance Scale Score 20%  Pain Max last 24 hours Not able to report  Pain Min Last 24 hours Not able to report  Dyspnea Max Last 24 Hours Not able to report  Dyspnea Min Last 24 hours Not able to report  Psychosocial & Spiritual Assessment   Palliative Care Outcomes       Patient Active Problem List   Diagnosis Date Noted  . Sepsis ([place]) 02/20/2020  . Hypokalemia   . Pressure injury of skin 02/16/2020  . Severe sepsis with septic shock ([place])   . Lactic acidosis   . Acute respiratory failure with hypoxia ([place])   . Enterococcal bacteremia   . Gout flare 02/08/2020  .  Acute respiratory failure due to COVID-19 ([place]) 01/25/2020  . Acute metabolic encephalopathy 41/93/7902  . Elevated troponin 02/08/2020  . Chronic anticoagulation 02/22/2020  . Generalized weakness 02/04/2020  . Hypernatremia 01/28/2020  . Hyperammonemia ([place]) 02/18/2020  . Palliative care encounter   . Effusion of right knee 01/27/2020  . Chronic systolic CHF (congestive heart failure) ([place]) 01/27/2020  . Acute on chronic diastolic heart failure ([place]) 01/26/2020  . Goals of care, counseling/discussion 12/17/2019  . MDS (myelodysplastic syndrome) ([place]) 12/16/2019  . Acute on chronic heart failure with preserved ejection fraction (HFpEF) ([place]) 12/10/2019  . AKI (acute kidney injury) ([place]) 12/10/2019  . Cirrhosis ([place])   . Symptomatic anemia 12/07/2019  . Macrocytic anemia 04/16/2019  . S/P TAVR (transcatheter aortic valve replacement)   . Bronchitis 04/17/2017  . Severe aortic stenosis 03/23/2017  . Atrial fibrillation, permanent ([place]) 03/23/2017  . Peripheral vision loss, bilateral   . Osteoarthritis   . Hypertension   . HOH (hard of hearing)   . Heart murmur   . Frequency of urination   . Dyspnea   . Bruises easily   . Diabetes mellitus type 2, uncomplicated ([place]) 40/97/3532  . Essential hypertension 10/15/2015  . 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 Diarrhea 08/24/2015  . Stroke ([place]) 07/25/2015  . Edema of leg 03/20/2015  . Bradycardia 03/20/2015  . Nocturia 10/24/2014  . Aftercare following surgery of the circulatory system, [place] 06/07/2012  . Occlusion and stenosis of carotid artery without mention of cerebral infarction 04/14/2011  . Shoulder pain 04/14/2011  . 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 Diabetes mellitus ([place]) 02/01/2011  . Coronary artery disease   . Hyperlipidemia   . Hypertensive urgency   . Obesity   . Carotid arterial disease ([place])   . 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 fib on Eliquis, type 2 diabetes, HTN, CKD 3a,cirrhosis andgoutadmitted on 1/22/2022with septic shock, acute on chronic resp failure 2/2 covid pne.Now intubated.  Assessment/Recommendations/Plan   Extubate and transition to full comfort measures only  D/C all life-prolonging interventions that are not providing comfort  Comfort orders as entered- continue propofol- titrate up to comfort, continue Fentanyl and boluses- bolus and titrate to comfort  Goals of Care and Additional Recommendations:  Limitations on Scope of Treatment: Full Comfort Care  Code Status:  DNR  Prognosis:   Hours - Days  Discharge Planning:  Anticipated Hospital Death  Care plan was discussed with patient's family.  Thank you for allowing the Palliative Medicine Team to assist in the care of this patient.   Total time: 58 minutes Greater than 50%  of this time was spent counseling and coordinating care related to the above assessment and plan.  Mariana Kaufman, AGNP-C Palliative Medicine   Please contact Palliative Medicine Team phone at [phone] for questions and concerns.

## 2020-02-26 NOTE — Progress Notes (Signed)
CRITICAL CARE NOTE 80 year old male presenting to the ED with poor p.o. take confusion and generalized weakness to the point of being unable to ambulate. COVID 19 + sCHF+Liver cirrhosis  ED course: On initial vitals patient was afebrile,BP 119/76,HR 66,tachypneic in the 20'swith SPO2 at 100% on 4 L nasal cannula.Labs significant JOA:CZYSAYTKZS 5.7(was 8.1-11 days prior),platelets 46,000(reduced from 69,000),sodium 153,creatinine 1.77(prior 1.16),ammonia 38,troponin 210,BNP 2000,COVID PCR positive.CT head showed no acute abnormalities.Patient was started on a blood transfusion 2 units PRBCs.Hospitalist service was consulted for admission. Patient became hypoxic requiring 6 L nasal cannula.BP also dropped with pressures in the high 01U low 93A systolically maps 35-57.Lactic increased from 2.1-6.6.Sepsis criteria initiated,PCCM consulted for further management and monitoring due to worsening respiratory status and possible impending shock.  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 CAD CHF-recent EF 45 to 50%,with global hypokinesis in the left ventricle HLD HTN A-Fibon Eliquis CVA T2DM Cirrhosis CKD Stage 3b TAVR Gout  Significant Hospital Events:  01/31/2020 Admit to SDU +ENTEROBACTERMIA 01/23/22CVC placed,patient transitionedto heated high flow nasal cannula 02/20/20 Intubated, 8.0 ETT 1/31 failure to wean from vent, severe resp failure 2/1 suffering, severe resp failure, failed weaning trials Consults:  PCCM  Procedures:  02/16/2020 CVC 3L RIJ>> 02/20/20 Intubation  Significant Diagnostic Tests:  02/07/2020 CT head>>no acute intracranial abnormalities,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-similar to prior examinations  Micro Data:  02/21/2020 COVID-19>>positive 02/16/2020 BC x2>> E faecalis  Antimicrobials:  02/01/2020 cefepime 1/22-1/23/2022  metronidazole 1/22-1/23/2022 vancomycin 1/22-1/27/2022 remdesivir 02/16/2020 - present ceftriaxone 02/16/2020 - present ampicillin       CC  follow up respiratory failure  SUBJECTIVE Patient remains critically ill Prognosis is guarded   BP (!) 118/54   Pulse (!) 31   Temp 98.2 F (36.8 C) (Axillary)   Resp 18   Ht 6' (1.829 m)   Wt 109.5 kg   SpO2 100%   BMI 32.74 kg/m    I/O last 3 completed shifts: In: -  Out: 4025 [Urine:4025] No intake/output data recorded.  SpO2: 100 % O2 Flow Rate (L/min): 55 L/min FiO2 (%): 28 %  Estimated body mass index is 32.74 kg/m as calculated from the following:   Height as of this encounter: 6' (1.829 m).   Weight as of this encounter: 109.5 kg.  SIGNIFICANT EVENTS   REVIEW OF SYSTEMS  PATIENT IS UNABLE TO PROVIDE COMPLETE REVIEW OF SYSTEMS DUE TO SEVERE CRITICAL ILLNESS       PHYSICAL EXAMINATION: GENERAL:critically ill appearing, +resp distress NECK: Supple.  PULMONARY: +rhonchi, +wheezing CARDIOVASCULAR: S1 and S2.  GASTROINTESTINAL: Soft, nontender, +Positive bowel sounds.  MUSCULOSKELETAL: No swelling, clubbing, or edema.  NEUROLOGIC: obtunded, GCS<8 SKIN:intact,warm,dry     MEDICATIONS: I have reviewed all medications and confirmed regimen as documented   CULTURE RESULTS   Recent Results (from the past 240 hour(s))  CULTURE, BLOOD (ROUTINE X 2) w Reflex to ID Panel     Status: None   Collection Time: 02/17/20  2:16 AM   Specimen: BLOOD  Result Value Ref Range Status   Specimen Description BLOOD BLOOD LEFT HAND  Final   Special Requests   Final    BOTTLES DRAWN AEROBIC AND ANAEROBIC Blood Culture adequate volume   Culture   Final    NO GROWTH 5 DAYS Performed at [facility], [address] [place], [place] [address]    Report Status 02/22/2020 FINAL  Final  CULTURE, BLOOD (ROUTINE X 2) w Reflex to ID Panel  Status: None   Collection Time: 02/17/20  2:16 AM   Specimen: BLOOD   Result Value Ref Range Status   Specimen Description BLOOD BLOOD RIGHT HAND  Final   Special Requests   Final    BOTTLES DRAWN AEROBIC AND ANAEROBIC Blood Culture adequate volume   Culture   Final    NO GROWTH 5 DAYS Performed at [facility], [address], [place], [place] [address]    Report Status 02/22/2020 FINAL  Final  Urine Culture     Status: None   Collection Time: 02/17/20  7:15 AM   Specimen: Urine, Random  Result Value Ref Range Status   Specimen Description   Final    URINE, RANDOM Performed at [facility], [address] [place], [place] [address]    Special Requests   Final    NONE Performed at [facility], [address], [place], [place] [address]    Culture   Final    NO GROWTH Performed at [facility], [place] [address], [place], [place] [address]    Report Status 02/18/2020 FINAL  Final  MRSA PCR Screening     Status: None   Collection Time: 02/17/20  7:15 AM  Result Value Ref Range Status   MRSA by PCR NEGATIVE NEGATIVE Final    Comment:        The GeneXpert MRSA Assay (FDA approved for NASAL specimens only), is one component of a comprehensive MRSA colonization surveillance program. It is not intended to diagnose MRSA infection nor to guide or monitor treatment for MRSA infections. Performed at [facility], [address] [place], [place] [address]           IMAGING    No results found.   Nutrition Status: Nutrition Problem: Increased nutrient needs Etiology: catabolic illness (COVID 19, CHF, Myelodysplastic syndrome, cirrhosis) Signs/Symptoms: estimated needs       Indwelling Urinary Catheter continued, requirement due to   Reason to continue Indwelling Urinary Catheter strict Intake/Output monitoring for hemodynamic instability   Central Line/ continued, requirement due to  Reason to continue [place] of central venous pressure or other hemodynamic parameters  and poor IV access   Ventilator continued, requirement due to severe respiratory failure   Ventilator Sedation RASS 0 to -2      ASSESSMENT AND PLAN SYNOPSIS ADMITTED  FOR  Severe ACUTE Hypoxic and Hypercapnic Respiratory Failure secondary to COVID pneumonia complicated by severe enterococcal bacteremia with underlying cirrhosis  Acute hypoxemic respiratory failure due to COVID-19 pneumonia/ARDS Mechanical ventilation via ARDS protocol, target PRVC 6 cc/kg Wean PEEP and FiO2 as able Goal plateau pressure less than 30, driving pressure less than 15 Paralytics if necessary for vent synchrony, gas exchange Cycle prone positioning if necessary for oxygenation Deep sedation per PAD protocol Diuresis as tolerated based on Kidney function VAP prevention order set Remdesivir Therapy IV STEROIDS Therapy  Severe ACUTE Hypoxic and Hypercapnic Respiratory Failure -continue Full MV support -continue Bronchodilator Therapy -Wean Fio2 and PEEP as tolerated -will perform SAT/SBT when respiratory parameters are met -VAP/VENT bundle implementation  ACUTE SYSTOLIC CARDIAC FAILURE-  -oxygen as needed -Lasix as tolerated   Morbid obesity, possible OSA.   Will certainly impact respiratory mechanics, ventilator weaning Suspect will need to consider additional PEEP   ACUTE KIDNEY INJURY/Renal Failure -continue Foley Catheter-assess need -Avoid nephrotoxic agents -Follow urine output, BMP -Ensure adequate renal perfusion, optimize oxygenation -Renal dose medications     NEUROLOGY Acute toxic metabolic  encephalopathy, need for sedation Goal RASS -2 to -3  SHOCK-SEPSIS-bacteramia E faecalis Pressors as needed  CARDIAC ICU monitoring  ID  -continue antibiotics as prescribed -follow up cultures -follow up ID consultation Blood cultures positive for E. Faecalis -Per ID, continue rocephin and ampicillin -initial TTE negative for vegetations, awaiting TEE -Levophed if MAP  <65  GI GI PROPHYLAXIS as indicated   DIET-->TF's as tolerated Constipation protocol as indicated  ENDO - will use ICU hypoglycemic\Hyperglycemia protocol if indicated   CHECK CBC TRANSFUSE AS NEEDED As per family request   ELECTROLYTES -follow labs as needed -replace as needed -pharmacy consultation and following   DVT/GI PRX ordered and assessed TRANSFUSIONS AS NEEDED MONITOR FSBS I Assessed the need for Labs I Assessed the need for Foley I Assessed the need for Central Venous Line Family Discussion when available I Assessed the need for Mobilization I made an Assessment of medications to be adjusted accordingly Safety Risk assessment completed   CASE DISCUSSED IN MULTIDISCIPLINARY ROUNDS WITH ICU TEAM  Critical Care Time devoted to patient care services described in this note is 55 minutes.   Overall, patient is critically ill, prognosis is guarded.  Patient with Multiorgan failure and at high risk for cardiac arrest and death.    Corrin Parker, M.D.  [facility]  Medical Director [facility] Director [facility] Cardio-Pulmonary Department

## 2020-02-26 NOTE — Progress Notes (Signed)
Palliative Medicine RN Note: Rec'd a call from [facility] NP Haughton, who has seen Evan [name] as OP PC pt. She reports that pt's wife Evan Hurl") called her late yesterday with questions. I reached out to our NP Leonard Downing, who is currently with [name] in the ICU; she will make sure all questions are addressed.  Marjie Skiff Shahira Fiske, RN, BSN, [facility] Palliative Medicine Team 02/26/2020 10:38 AM Office [phone]

## 2020-02-26 NOTE — Progress Notes (Signed)
  Chaplain On-Call responded to Order Requisition from NP Mariana Kaufman for "end of life".  Chaplain received PPE assistance from [employer], and met patient's wife and daughter at bedside.  Chaplain provided spiritual and emotional support. Family stated that they hope their Evan [name] will be able to visit this afternoon.  Chaplains are available as needed.  Alder Jammie Clink M.Div., [facility]

## 2020-02-26 NOTE — Progress Notes (Signed)
Pt. Extubated to 3lnc.

## 2020-02-27 DIAGNOSIS — C946 Myelodysplastic disease, not classified: Secondary | ICD-10-CM | POA: Diagnosis not present

## 2020-02-27 DIAGNOSIS — B952 Enterococcus as the cause of diseases classified elsewhere: Secondary | ICD-10-CM | POA: Diagnosis not present

## 2020-02-27 DIAGNOSIS — U071 COVID-19: Secondary | ICD-10-CM | POA: Diagnosis not present

## 2020-02-27 DIAGNOSIS — R7881 Bacteremia: Secondary | ICD-10-CM | POA: Diagnosis not present

## 2020-02-27 DIAGNOSIS — J9600 Acute respiratory failure, unspecified whether with hypoxia or hypercapnia: Secondary | ICD-10-CM | POA: Diagnosis not present

## 2020-02-27 DIAGNOSIS — Z7189 Other specified counseling: Secondary | ICD-10-CM | POA: Diagnosis not present

## 2020-02-28 LAB — BPAM RBC
Blood Product Expiration Date: 202202262359
Unit Type and Rh: 6200

## 2020-02-28 LAB — TYPE AND SCREEN
ABO/RH(D): A POS
Antibody Screen: NEGATIVE
Unit division: 0

## 2020-02-28 LAB — PREPARE RBC (CROSSMATCH)

## 2020-03-11 LAB — BLOOD GAS, ARTERIAL
Acid-Base Excess: 5.8 mmol/L — ABNORMAL HIGH (ref 0.0–2.0)
Bicarbonate: 29.8 mmol/L — ABNORMAL HIGH (ref 20.0–28.0)
O2 Saturation: 90.5 %
Patient temperature: 37
pCO2 arterial: 40 mmHg (ref 32.0–48.0)
pH, Arterial: 7.48 — ABNORMAL HIGH (ref 7.350–7.450)
pO2, Arterial: 55 mmHg — ABNORMAL LOW (ref 83.0–108.0)

## 2020-03-24 NOTE — Progress Notes (Signed)
Nutrition Brief Note  Chart reviewed. Pt now transitioning to comfort care.  No further nutrition interventions warranted at this time.  Please re-consult as needed.   Koleen Distance MS, RD, LDN Please refer to [facility] for RD and/or RD on-call/weekend/after hours pager

## 2020-03-24 NOTE — Progress Notes (Signed)
Met with a fairly large family of patient who is deceased. Wife and only daughter needed the most support. Was able to provide presence and conversation for the family.

## 2020-03-24 NOTE — Death Summary Note (Signed)
DEATH SUMMARY   Patient Details  Name: Evan [name] MRN: [id number] DOB: [birth year]  Admission/Discharge Information   Admit Date:  03/13/20  Date of Death:   Mar 25, 2020   Time of Death:  2:10PM  Length of Stay: 12  Referring Physician: Leonel Ramsay, MD   Reason(s) for Hospitalization  COVID 19 pneumonia  Diagnoses  Preliminary cause of death: COVID 31 pneumonia, DM Secondary Diagnoses (including complications and co-morbidities):  Principal Problem:   Enterococcal bacteremia Active Problems:   Coronary artery disease   Diabetes mellitus ([place])   Essential hypertension   Atrial fibrillation, permanent (HCC)   S/P TAVR (transcatheter aortic valve replacement)   Diabetes mellitus type 2, uncomplicated (HCC)   Symptomatic anemia   Cirrhosis ([place])   AKI (acute kidney injury) ([place])   MDS (myelodysplastic syndrome) (HCC)   Chronic systolic CHF (congestive heart failure) (HCC)   Gout flare   Acute respiratory failure due to COVID-19 [facility])   Acute metabolic encephalopathy   Elevated troponin   Chronic anticoagulation   Generalized weakness   Hypernatremia   Hyperammonemia (HCC)   Pressure injury of skin   Severe sepsis with septic shock (HCC)   Lactic acidosis   Acute respiratory failure with hypoxia ([place])   Hypokalemia   Sepsis [facility])   Brief Hospital Course (including significant findings, care, treatment, and services provided and events leading to death)  80 year old male presenting to the ED with poor p.o. take confusion and generalized weakness to the point of being unable to ambulate. COVID 19 + sCHF+Liver cirrhosis  ED course: On initial vitals patient was afebrile,BP 119/76,HR 66,tachypneic in the 20'swith SPO2 at 100% on 4 L nasal cannula.Labs significant GEZ:MOQHUTMLYY 5.7(was 8.1-11 days prior),platelets 46,000(reduced from 69,000),sodium 153,creatinine 1.77(prior 1.16),ammonia 38,troponin 210,BNP 2000,COVID PCR positive.CT  head showed no acute abnormalities.Patient was started on a blood transfusion 2 units PRBCs.Hospitalist service was consulted for admission. Patient became hypoxic requiring 6 L nasal cannula.BP also dropped with pressures in the high 50P low 54S systolically maps 56-81.Lactic increased from 2.1-6.6.Sepsis criteria initiated,PCCM consulted for further management and monitoring due to worsening respiratory status and possible impending shock.  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 CAD CHF-recent EF 45 to 50%,with global hypokinesis in the left ventricle HLD HTN A-Fibon Eliquis CVA T2DM Cirrhosis CKD Stage 3b TAVR Gout  Significant Hospital Events:  03/13/2020 Admit to SDU +ENTEROBACTERMIA 01/23/22CVC placed,patient transitionedto heated high flow nasal cannula 02/20/20 Intubated, 8.0 ETT 1/31 failure to wean from vent, severe resp failure 2/1 suffering, severe resp failure, failed weaning trials Consults:  PCCM  Procedures:  02/16/2020 CVC 3L RIJ>> 02/20/20 Intubation  Significant Diagnostic Tests:  13-Mar-2020 CT head>>no acute intracranial abnormalities,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-similar to prior examinations  Micro Data:  2020-03-13 COVID-19>>positive 02/16/2020 BC x2>> E faecalis  Antimicrobials:  2020-03-13 cefepime 1/22-1/23/2022 metronidazole 1/22-1/23/2022 vancomycin 1/22-1/27/2022 remdesivir 02/16/2020 - present ceftriaxone 02/16/2020 - present ampicillin   GOALS OF CARE DISCUSSION  The Clinical status was relayed to family in detail.  Updated and notified of patients medical condition.  Patient remains unresponsive and will not open eyes to command.    Patient is having a weak cough and struggling to remove secretions.   Patient with increased WOB and using accessory muscles to breathe Explained to family course of therapy and the modalities     Patient  with Progressive multiorgan failure with a very high probablity of a very minimal chance of meaningful recovery despite all aggressive  and optimal medical therapy. Patient is in the Dying  Process associated with Suffering.  Family understands the situation.  They have consented and agreed to DNR/DNI and would like to proceed with Comfort care measures.  Family are satisfied with Plan of action and management. All questions answered  Patient died 03/12/2020 _0      Pertinent Labs and Studies  Significant Diagnostic Studies DG Chest 1 View  Result Date: 02/20/2020 Bronson Ing, MD     02/20/2020 10:36 AM Endotracheal Intubation: Patient required placement of an artificial airway secondary to Respiratory Failure Consent: Emergent. Hand washing performed prior to starting the procedure. Medications administered for sedation prior to procedure: Midazolam 2 mg IV,  Etomidate 30 mg IV, Succinylcholine 160 mcg IV. A time out procedure was called and correct patient, name, & ID confirmed. Needed supplies and equipment were assembled and checked to include ETT, 10 ml syringe, Glidescope, Mac and Miller blades, suction, oxygen and bag mask valve, end tidal CO2 monitor. Patient was positioned to align the mouth and pharynx to facilitate visualization of the glottis. Heart rate, SpO2 and blood pressure was continuously monitored during the procedure. Pre-oxygenation was conducted prior to intubation and endotracheal tube was placed through the vocal cords into the trachea.  The artificial airway was placed under direct visualization via glidescope route using a 8-0 ETT on the first attempt. ETT was secured at 26 cm mark at the teeth. Placement was confirmed by auscuitation of lungs with good breath sounds bilaterally and no stomach sounds.  Condensation was noted on endotracheal tube.  Pulse ox 98%.  CO2 detector in place with appropriate color change. Complications: None . Operator: Nam. Chest radiograph  ordered and pending. Comments: OGT placed via glidescope. Arcelia Jew MD intensivist   DG Chest 1 View  Result Date: 01/30/2020 CLINICAL DATA:  Increasing lower extremity edema EXAM: CHEST  1 VIEW COMPARISON:  01/26/2020 FINDINGS: Cardiac shadow is enlarged. Aortic calcifications are noted. Postsurgical changes are again seen. Pacing device is again noted. Increasing vascular congestion with interstitial edema is noted worsened from the prior exam. Small left pleural effusion is noted. No bony abnormality is seen. IMPRESSION: Increasing CHF when compared with the prior exam Electronically Signed   By: Inez Catalina M.D.   On: 01/30/2020 09:08   DG Chest 2 View  Result Date: 01/29/2020 CLINICAL DATA:  Altered level of consciousness EXAM: CHEST - 2 VIEW COMPARISON:  01/30/2020 FINDINGS: Frontal and lateral views of the chest demonstrate stable single lead pacemaker. 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 Cardiac silhouette is mildly enlarged but stable. There is persistent central vascular congestion, with patchy left basilar airspace disease and small left effusion. No pneumothorax. No acute bony abnormalities. IMPRESSION: 1. Mild congestive heart failure, without significant change since previous. Electronically Signed   By: Randa Ngo M.D.   On: 02/05/2020 18:58   DG Knee 1-2 Views Right  Result Date: 01/31/2020 CLINICAL DATA:  Right knee pain and swelling. EXAM: RIGHT KNEE - 1-2 VIEW COMPARISON:  Plain films right knee 01/26/2020. FINDINGS: Small joint effusion is unchanged. There is no acute bony or joint abnormality. Joint spaces are preserved but there is osteophytosis about the which is most notable in the patellofemoral and lateral compartments. Chondrocalcinosis is again seen. Vascular clips the posterior soft tissues medially again noted. Atherosclerosis. IMPRESSION: No acute abnormality. No change in a small knee joint effusion. Mild-to-moderate osteoarthritis.  Chondrocalcinosis. Electronically Signed   By: Inge Rise M.D.   On:  01/31/2020 10:44   DG Abd 1 View  Result Date: 02/20/2020 Bronson Ing, MD     02/20/2020 10:36 AM Endotracheal Intubation: Patient required placement of an artificial airway secondary to Respiratory Failure Consent: Emergent. Hand washing performed prior to starting the procedure. Medications administered for sedation prior to procedure: Midazolam 2 mg IV,  Etomidate 30 mg IV, Succinylcholine 160 mcg IV. A time out procedure was called and correct patient, name, & ID confirmed. Needed supplies and equipment were assembled and checked to include ETT, 10 ml syringe, Glidescope, Mac and Miller blades, suction, oxygen and bag mask valve, end tidal CO2 monitor. Patient was positioned to align the mouth and pharynx to facilitate visualization of the glottis. Heart rate, SpO2 and blood pressure was continuously monitored during the procedure. Pre-oxygenation was conducted prior to intubation and endotracheal tube was placed through the vocal cords into the trachea.  The artificial airway was placed under direct visualization via glidescope route using a 8-0 ETT on the first attempt. ETT was secured at 26 cm mark at the teeth. Placement was confirmed by auscuitation of lungs with good breath sounds bilaterally and no stomach sounds.  Condensation was noted on endotracheal tube.  Pulse ox 98%.  CO2 detector in place with appropriate color change. Complications: None . Operator: Nam. Chest radiograph ordered and pending. Comments: OGT placed via glidescope. Arcelia Jew MD intensivist   CT Head Wo Contrast  Result Date: 01/30/2020 CLINICAL DATA:  80 year old male with history of altered mental status. EXAM: CT HEAD WITHOUT CONTRAST TECHNIQUE: Contiguous axial images were obtained from the base of the skull through the vertex without intravenous contrast. COMPARISON:  Head CT 08/24/2015.  Brain MRI 08/24/2015. FINDINGS: Brain: Expansion of the  atrium of the right lateral ventricle with extensive low attenuation in the right occipital region, related to remote right PCA territory infarction. No evidence of acute infarction, hemorrhage, hydrocephalus, extra-axial collection or mass lesion/mass effect. Vascular: No hyperdense vessel or unexpected calcification. Skull: Normal. Negative for fracture or focal lesion. Sinuses/Orbits: No acute finding. Other: None. IMPRESSION: 1. No acute intracranial abnormalities. 2. 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 Electronically Signed   By: Vinnie Langton M.D.   On: 02/19/2020 19:36   US RENAL  Result Date: 01/30/2020 CLINICAL DATA:  Worsening renal function. EXAM: RENAL / URINARY TRACT ULTRASOUND COMPLETE COMPARISON:  None. FINDINGS: Right Kidney: Renal measurements: 10.9 x 6.7 x 6.3 cm = volume: 242 mL. Increased echogenicity of renal parenchyma is noted suggesting medical renal disease. No mass or hydronephrosis visualized. Left Kidney: Renal measurements: 10.9 x 6.5 x 5.5 cm = volume: 204 mL. Increased echogenicity of renal parenchyma is noted suggesting medical renal disease. No mass or hydronephrosis visualized. Bladder: Appears normal for degree of bladder distention. Other: None. IMPRESSION: Increased echogenicity of renal parenchyma is noted suggesting medical renal disease. No hydronephrosis or renal obstruction is noted. Electronically Signed   By: Marijo Conception M.D.   On: 01/30/2020 09:01   DG Chest Port 1 View  Result Date: 02/23/2020 CLINICAL DATA:  Endotracheal tube EXAM: PORTABLE CHEST 1 VIEW COMPARISON:  3 days ago FINDINGS: Endotracheal tube with tip at the carina. Enteric tube with tip at least reaching the upper stomach. Right IJ line with tip at the upper cavoatrial junction. Cardiomegaly. There has been CABG and aortic valve repair. Pacer lead into the right ventricle. Bilateral pneumonia. Worsened aeration at the left base  where the diaphragm is no longer  distinct. No evidence of air leak. These results will be called to the ordering clinician or representative by the Radiologist Assistant, and communication documented in the PACS or [employer]. IMPRESSION: 1. Lower endotracheal tube with tip at the carina. Consider retraction by approximately 3 cm. 2. Bilateral pneumonia with worsened aeration at the left base. Electronically Signed   By: Monte Fantasia M.D.   On: 02/23/2020 05:37   DG Chest Port 1 View  Result Date: 02/17/2020 CLINICAL DATA:  Acute respiratory failure EXAM: PORTABLE CHEST 1 VIEW COMPARISON:  02/16/2020 FINDINGS: Right internal jugular central venous catheter tip noted within the right atrium. Lung volumes are small, but are symmetric and are stable since prior examination. Superimposed perihilar and lower lung zone pulmonary infiltrate appears slightly progressive since prior examination. This may represent a component of superimposed pulmonary edema. No pneumothorax. Tiny left pleural effusion suspected. 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 Mild cardiomegaly is stable. 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 IMPRESSION: Progressive pulmonary infiltrates, possibly representing a component of superimposed cardiogenic failure. Probable tiny left pleural effusion. Stable pulmonary hypoinflation. Electronically Signed   By: Fidela Salisbury MD   On: 02/17/2020 06:00   DG CHEST PORT 1 VIEW  Result Date: 02/16/2020 CLINICAL DATA:  Central venous catheter placement. EXAM: PORTABLE CHEST 1 VIEW COMPARISON:  Chest radiograph 02/16/2020. FINDINGS: Right IJ central venous catheter tip projects over the superior vena cava. Pacer apparatus overlies the left hemithorax. Stable cardiomegaly. Aortic atherosclerosis. Similar-appearing diffuse bilateral airspace opacities. Possible small left pleural effusion. No pneumothorax. Aortic valve  replacement. IMPRESSION: Right IJ central venous catheter tip projects over the superior vena cava. Similar-appearing diffuse bilateral airspace opacities. Electronically Signed   By: Lovey Newcomer M.D.   On: 02/16/2020 07:55   DG Chest Portable 1 View  Result Date: 02/16/2020 CLINICAL DATA:  Central venous catheter placed EXAM: PORTABLE CHEST 1 VIEW COMPARISON:  02/20/2020 FINDINGS: Right internal jugular central venous catheter is in place with its tip within the superior right atrium. No pneumothorax. Pulmonary insufflation has diminished resulting in vascular crowding at the hila. No pneumothorax. Possible tiny left pleural effusion. Coronary artery bypass grafting has been performed. Transcatheter aortic valve noted. Mild cardiomegaly is present. Left subclavian single lead pacemaker is seen with its lead overlying the expected right ventricle. IMPRESSION: Right internal jugular central venous catheter tip within the superior right atrium. No pneumothorax. Electronically Signed   By: Fidela Salisbury MD   On: 02/16/2020 05:07   ECHOCARDIOGRAM COMPLETE  Result Date: 02/17/2020    ECHOCARDIOGRAM REPORT   Patient Name:   FONG MCCARRY [facility] Date of Exam: 02/17/2020 Medical Rec #:  [id number]      Height:       72.0 in Accession #:    [id number]     Weight:       229.3 lb Date of Birth:  Aug 01, 1940      BSA:          2.258 m Patient Age:    33 years       BP:           149/90 mmHg Patient Gender: M              HR:           96 bpm. Exam Location:  [facility] Procedure: 2D Echo, Color Doppler and Cardiac Doppler Indications:     R78.81 Bacteremia  History:  Patient has prior history of Echocardiogram examinations, most                  recent 12/08/2019. CAD, Pacemaker, Stroke; Risk                  Factors:Diabetes, Hypertension and Dyslipidemia.  Sonographer:     Charmayne Sheer RDCS (AE) Referring Phys:  [id number] Rosine Door Diagnosing Phys: Neoma Laming MD  Sonographer Comments: Suboptimal apical window. IMPRESSIONS   1. Left ventricular ejection fraction, by estimation, is 35 to 40%. The left ventricle has severely decreased function. The left ventricle demonstrates global hypokinesis. The left ventricular internal cavity size was severely dilated. There is mild concentric left ventricular hypertrophy. Left ventricular diastolic parameters are consistent with Grade III diastolic dysfunction (restrictive).  2. Right ventricular systolic function is severely reduced. The right ventricular size is severely enlarged.  3. Left atrial size was severely dilated.  4. Right atrial size was severely dilated.  5. The mitral valve is degenerative. Mild mitral valve regurgitation. No evidence of mitral stenosis. Severe mitral annular calcification.  6. Tricuspid valve regurgitation is moderate.  7. The aortic valve is calcified. Aortic valve regurgitation is mild. Mild aortic valve sclerosis is present, with no evidence of aortic valve stenosis.  8. The inferior vena cava is normal in size with greater than 50% respiratory variability, suggesting right atrial pressure of 3 mmHg. FINDINGS  Left Ventricle: Left ventricular ejection fraction, by estimation, is 35 to 40%. The left ventricle has severely decreased function. The left ventricle demonstrates global hypokinesis. The left ventricular internal cavity size was severely dilated. There is mild concentric left ventricular hypertrophy. Left ventricular diastolic parameters are consistent with Grade III diastolic dysfunction (restrictive). Right Ventricle: The right ventricular size is severely enlarged. No increase in right ventricular wall thickness. Right ventricular systolic function is severely reduced. Left Atrium: Left atrial size was severely dilated. Right Atrium: Right atrial size was severely dilated. Pericardium: There is no evidence of pericardial effusion. Mitral Valve: The mitral valve is degenerative in appearance. Severe mitral annular calcification. Mild mitral valve  regurgitation. No evidence of mitral valve stenosis. MV peak gradient, 6.5 mmHg. The mean mitral valve gradient is 2.0 mmHg. Tricuspid Valve: The tricuspid valve is normal in structure. Tricuspid valve regurgitation is moderate . No evidence of tricuspid stenosis. Aortic Valve: The aortic valve is calcified. Aortic valve regurgitation is mild. Mild aortic valve sclerosis is present, with no evidence of aortic valve stenosis. Aortic valve mean gradient measures 9.0 mmHg. Aortic valve peak gradient measures 20.6 mmHg. Aortic valve area, by VTI measures 1.60 cm. Pulmonic Valve: The pulmonic valve was normal in structure. Pulmonic valve regurgitation is not visualized. No evidence of pulmonic stenosis. Aorta: The aortic root is normal in size and structure. Venous: The inferior vena cava is normal in size with greater than 50% respiratory variability, suggesting right atrial pressure of 3 mmHg. IAS/Shunts: The interatrial septum was not assessed.  LEFT VENTRICLE PLAX 2D LVIDd:         5.87 cm LVIDs:         4.74 cm LV PW:         1.28 cm LV IVS:        1.19 cm LVOT diam:     2.40 cm LV SV:         52 LV SV Index:   23 LVOT Area:     4.52 cm  LEFT ATRIUM  Index LA diam:      5.10 cm  2.26 cm/m LA Vol (A4C): 117.0 ml 51.82 ml/m  AORTIC VALVE                    PULMONIC VALVE AV Area (Vmax):    1.72 cm     PV Vmax:       1.45 m/s AV Area (Vmean):   1.79 cm     PV Vmean:      91.800 cm/s AV Area (VTI):     1.60 cm     PV VTI:        0.181 m AV Vmax:           227.00 cm/s  PV Peak grad:  8.4 mmHg AV Vmean:          134.000 cm/s PV Mean grad:  4.0 mmHg AV VTI:            0.322 m AV Peak Grad:      20.6 mmHg AV Mean Grad:      9.0 mmHg LVOT Vmax:         86.30 cm/s LVOT Vmean:        52.900 cm/s LVOT VTI:          0.114 m LVOT/AV VTI ratio: 0.35  AORTA Ao Root diam: 2.70 cm MITRAL VALVE                TRICUSPID VALVE MV Area (PHT): 3.71 cm     TR Peak grad:   55.1 mmHg MV Area VTI:   1.51 cm     TR Vmax:         371.00 cm/s MV Peak grad:  6.5 mmHg MV Mean grad:  2.0 mmHg     SHUNTS MV Vmax:       1.27 m/s     Systemic VTI:  0.11 m MV Vmean:      70.1 cm/s    Systemic Diam: 2.40 cm MV Decel Time: 205 msec MV E velocity: 117.50 cm/s Neoma Laming MD Electronically signed by Neoma Laming MD Signature Date/Time: 02/17/2020/2:51:43 PM    Final     Microbiology No results found for this or any previous visit (from the past 240 hour(s)).  Lab Basic Metabolic Panel: Recent Labs  Lab 02/21/20 1752 02/22/20 0415 02/22/20 1626 02/23/20 0500 02/24/20 0610 02/25/20 0532  NA 147* 150*  --  143 147* 148*  K 4.1 4.3  --  4.0 4.1 3.9  CL 109 109  --  106 109 108  CO2 28 29  --  _0 GLUCOSE 161* 130*  --  348* 245* 277*  BUN 63* 69*  --  91* 87* 92*  CREATININE 1.49* 1.63*  --  2.03* 1.65* 1.62*  CALCIUM 7.8* 7.6*  --  7.5* 7.6* 7.6*  MG 2.4 2.5* 2.6* 2.6* 2.5* 2.6*  PHOS 3.2 3.7 4.9* 4.0 3.5 3.5   Liver Function Tests: Recent Labs  Lab 02/23/20 0500  AST 39  ALT 30  ALKPHOS 160*  BILITOT 3.7*  PROT 4.9*  ALBUMIN 1.9*   No results for input(s): LIPASE, AMYLASE in the last 168 hours. Recent Labs  Lab 02/23/20 1020  AMMONIA 46*   CBC: Recent Labs  Lab 02/22/20 0415 02/23/20 0500 02/24/20 0610 02/25/20 0532 02/26/20 0758  WBC 4.1 7.9 8.9 5.7 9.3  HGB 7.7* 8.9* 8.4* 6.7* 7.3*  HCT 24.6* 27.6* 25.9* 21.4* 23.3*  MCV 95.0 94.2 92.8 96.0  97.1  PLT 23* 36* 34* 27* 35*   Cardiac Enzymes: No results for input(s): CKTOTAL, CKMB, CKMBINDEX, TROPONINI in the last 168 hours. Sepsis Labs: Recent Labs  Lab 02/23/20 0500 02/24/20 0610 02/25/20 0532 02/26/20 0758  WBC 7.9 8.9 5.7 9.3      Lynette Noah 2020/03/15, 2:15 PM

## 2020-03-24 NOTE — Progress Notes (Signed)
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  Patient was found to have antral coccus bacteremia, was intubated on 02/20/2020, failure to wean from vent with severe respiratory failure on 02/24/2020.  Palliative care was consulted and after discussion with family patient was extubated for comfort measures only.    Subjective   Patient seen and examined, currently on morphine gtt. 10 mg/h.   Assessment/Plan:     1. Acute hypoxemic respiratory failure-secondary to COVID-19 pneumonia, ARDS.  Patient was intubated on mechanical ventilation.  However he could not be weaned off and palliative care was consulted.  After discussion we decided that patient was made comfort care.  And extubated.  Currently patient is on morphine GTT.  Anticipate hospital death.     COVID-19 Labs  Recent Labs    02/25/20 0532  FERRITIN 864*  CRP 1.1*    Lab Results  Component Value Date   SARSCOV2NAA POSITIVE (A) 02/04/2020   SARSCOV2NAA NEGATIVE 01/26/2020   [place] NEGATIVE 12/07/2019     Scheduled medications:         CBG: Recent Labs  Lab 02/25/20 1157 02/25/20 1919 02/25/20 2318 02/26/20 0313 02/26/20 0748  GLUCAP 253* 246* 303* 344* 365*    SpO2: 100 % O2 Flow Rate (L/min): 3 L/min FiO2 (%): 28 %    CBC: Recent Labs  Lab 02/22/20 0415 02/23/20 0500 02/24/20 0610  02/25/20 0532 02/26/20 0758  WBC 4.1 7.9 8.9 5.7 9.3  HGB 7.7* 8.9* 8.4* 6.7* 7.3*  HCT 24.6* 27.6* 25.9* 21.4* 23.3*  MCV 95.0 94.2 92.8 96.0 97.1  PLT 23* 36* 34* 27* 35*    Basic Metabolic Panel: Recent Labs  Lab 02/21/20 1752 02/22/20 0415 02/22/20 1626 02/23/20 0500 02/24/20 0610 02/25/20 0532  NA 147* 150*  --  143 147* 148*  K 4.1 4.3  --  4.0 4.1 3.9  CL 109 109  --  106 109 108  CO2 28 29  --  26 25 27   GLUCOSE 161* 130*  --  348* 245* 277*  BUN 63* 69*  --  91* 87* 92*  CREATININE 1.49* 1.63*  --  2.03* 1.65* 1.62*  CALCIUM 7.8* 7.6*  --  7.5* 7.6* 7.6*  MG 2.4 2.5* 2.6* 2.6* 2.5* 2.6*  PHOS 3.2 3.7 4.9* 4.0 3.5 3.5     Liver Function Tests: Recent Labs  Lab 02/23/20 0500  AST 39  ALT 30  ALKPHOS 160*  BILITOT 3.7*  PROT 4.9*  ALBUMIN 1.9*     Antibiotics: Anti-infectives (From admission, onward)   Start     Dose/Rate Route Frequency Ordered Stop   02/22/20 1800  ampicillin (OMNIPEN) 2 g in sodium chloride 0.9 % 100 mL IVPB  Status:  Discontinued        2 g 300 mL/hr over 20 Minutes Intravenous Every 6 hours 02/22/20 1344 02/26/20 1036   02/20/20 1200  ampicillin (OMNIPEN) 2 g in sodium chloride 0.9 % 100  mL IVPB  Status:  Discontinued        2 g 300 mL/hr over 20 Minutes Intravenous Every 4 hours 02/20/20 0901 02/22/20 1344   02/17/20 1600  gentamicin (GARAMYCIN) 90 mg in dextrose 5 % 50 mL IVPB  Status:  Discontinued       "Followed by" Linked Group Details   90 mg 104.5 mL/hr over 30 Minutes Intravenous Every 24 hours 02/16/20 1453 02/16/20 1512   02/17/20 1000  remdesivir 100 mg in sodium chloride 0.9 % 100 mL IVPB       "Followed by" Linked Group Details   100 mg 200 mL/hr over 30 Minutes Intravenous Daily 02/16/20 0135 02/20/20 0945   02/16/20 1800  ceFEPIme (MAXIPIME) 2 g in sodium chloride 0.9 % 100 mL IVPB  Status:  Discontinued        2 g 200 mL/hr over 30 Minutes Intravenous Every 12 hours 02/16/20 0303 02/16/20 1320   02/16/20  1545  gentamicin (GARAMYCIN) 130 mg in dextrose 5 % 50 mL IVPB  Status:  Discontinued       "Followed by" Linked Group Details   1.5 mg/kg  88.2 kg (Adjusted) 106.5 mL/hr over 30 Minutes Intravenous  Once 02/16/20 1453 02/16/20 1512   02/16/20 1530  cefTRIAXone (ROCEPHIN) 2 g in sodium chloride 0.9 % 100 mL IVPB  Status:  Discontinued        2 g 200 mL/hr over 30 Minutes Intravenous Every 12 hours 02/16/20 1431 02/26/20 1036   02/16/20 1415  ampicillin (OMNIPEN) 2 g in sodium chloride 0.9 % 100 mL IVPB  Status:  Discontinued        2 g 300 mL/hr over 20 Minutes Intravenous Every 6 hours 02/16/20 1320 02/20/20 0901   02/16/20 1200  vancomycin (VANCOCIN) IVPB 1000 mg/200 mL premix  Status:  Discontinued        1,000 mg 200 mL/hr over 60 Minutes Intravenous Every 24 hours 02/16/20 0517 02/16/20 1320   02/16/20 0400  remdesivir 200 mg in sodium chloride 0.9% 250 mL IVPB       "Followed by" Linked Group Details   200 mg 580 mL/hr over 30 Minutes Intravenous Once 02/16/20 0135 02/16/20 0531   02/16/20 0300  metroNIDAZOLE (FLAGYL) IVPB 500 mg  Status:  Discontinued        500 mg 100 mL/hr over 60 Minutes Intravenous Every 8 hours 02/16/20 0142 02/16/20 1320   02/16/20 0145  ceFEPIme (MAXIPIME) 2 g in sodium chloride 0.9 % 100 mL IVPB        2 g 200 mL/hr over 30 Minutes Intravenous  Once 02/16/20 0142 02/16/20 0357   02/16/20 0145  vancomycin (VANCOCIN) IVPB 1000 mg/200 mL premix        1,000 mg 200 mL/hr over 60 Minutes Intravenous  Once 02/16/20 0142 02/16/20 0333       DVT prophylaxis: Comfort measures only  Code Status: DNR  Family Communication: Discussed with patient's wife at bedside   Consultants:  Pulmonary and critical care  Procedures:  Intubation 02/20/2020  Extubated on 2/1//2022    Objective   Vitals:   02/26/20 1400 02/26/20 1500 02/26/20 1600 02/26/20 2000  BP: 106/77  (!) 109/52 137/71  Pulse: 77 71 67 83  Resp: (!) 39 16 11 (!) 6  Temp:       TempSrc:      SpO2: (!) 76% 97% 100% 100%  Weight:      Height:        Intake/Output  Summary (Last 24 hours) at 2020/03/22 1413 Last data filed at 2020/03/22 1311 Gross per 24 hour  Intake 343.14 ml  Output --  Net 343.14 ml    02/01 1901 - 02/03 0700 In: 4300.5 [I.V.:611.5] Out: 1125 [id number]  Filed Weights   02/22/20 0400 02/24/20 0500 02/25/20 0447  Weight: 112.4 kg 109.9 kg 109.5 kg    Physical Examination:    General: Appears comfortable  Cardiovascular: S1-S2, regular  Respiratory: Decreased breath sounds bilaterally,.periods of apnea noted  Abdomen: Soft, nontender, no organomegaly  Extremities: b/i edema in lower extremities  Neurologic: Obtunded, not responsive to verbal or tactile stimuli   Status is: Inpatient  Dispo: The patient is from: Home              Anticipated d/c is to: Likely hospital death              Anticipated d/c date is: Hospital death                     Data Reviewed:   No results found for this or any previous visit (from the past 240 hour(s)).  No results for input(s): LIPASE, AMYLASE in the last 168 hours. Recent Labs  Lab 02/23/20 1020  AMMONIA 46*    Cardiac Enzymes: No results for input(s): CKTOTAL, CKMB, CKMBINDEX, TROPONINI in the last 168 hours. BNP (last 3 results) Recent Labs    01/26/20 1300 01/30/2020 1907  BNP 2,541.1* 1,999.6*    ProBNP (last 3 results) Recent Labs    12/06/19 [place]   [facility] If 7PM-7AM, please contact night-coverage at [email or web address], Office  [phone]   Mar 22, 2020, 2:13 PM  LOS: 12 days

## 2020-03-24 NOTE — Progress Notes (Signed)
Daily Progress Note   Patient Name: Evan [name]       Date: 03/23/2020 DOB: 02/16/1940  Age: 80 y.o. MRN#: [id number] Attending Physician: Oswald Hillock, MD Primary Care Physician: Leonel Ramsay, MD Admit Date: 02/05/2020  Reason for Consultation/Follow-up: Establishing goals of care and Terminal Care  Subjective: Patient appears comfortable. Having periods of apnea.  Spouse and daughter at bedside.  Reviewed comfort measures and signs of discomfort to look for.   Review of Systems  Unable to perform ROS: Acuity of condition    Length of Stay: 12  Current Medications: Scheduled Meds:  . chlorhexidine gluconate (MEDLINE KIT)  15 mL Mouth Rinse BID  . Chlorhexidine Gluconate Cloth  6 each Topical Daily  . mouth rinse  15 mL Mouth Rinse 10 times per day    Continuous Infusions: . sodium chloride    . morphine 10 mg/hr (03-23-20 0900)    PRN Meds: acetaminophen **OR** acetaminophen, diphenhydrAMINE, glycopyrrolate **OR** glycopyrrolate **OR** glycopyrrolate, haloperidol lactate, midazolam, morphine, ondansetron **OR** ondansetron (ZOFRAN) IV, polyvinyl alcohol  Physical Exam Vitals and nursing note reviewed.  Constitutional:      Comments: Actively dying  Cardiovascular:     Comments: Diffuse anasarca, lower extremities cool, pedal pulses nonpalpable, radial pulses irregular Pulmonary:     Comments: Periods of apnea Skin:    Comments: Ashen appearance, scattered bruises on BUE  Neurological:     Comments: nonresponsive to all stimuli             Vital Signs: BP 137/71   Pulse 83   Temp 98.2 F (36.8 C) (Axillary)   Resp (!) 6   Ht 6' (1.829 m)   Wt 109.5 kg   SpO2 100%   BMI 32.74 kg/m  SpO2: SpO2: 100 % O2 Device: O2 Device: Nasal Cannula O2 Flow  Rate: O2 Flow Rate (L/min): 3 L/min  LBM: Last BM Date: 02/26/20 Baseline Weight: Weight: 104 kg Most recent weight: Weight: 109.5 kg       Palliative Assessment/Data: PPS: 10%     Patient Active Problem List   Diagnosis Date Noted  . Sepsis ([place]) 02/20/2020  . Hypokalemia   . Pressure injury of skin 02/16/2020  . Severe sepsis with septic shock ([place])   . Lactic acidosis   . Acute respiratory failure with hypoxia (  HCC)   . Enterococcal bacteremia   . Gout flare 02/19/2020  . Acute respiratory failure due to COVID-19 ([place]) 02/11/2020  . Acute metabolic encephalopathy 92/92/4462  . Elevated troponin 02/14/2020  . Chronic anticoagulation 02/18/2020  . Generalized weakness 02/12/2020  . Hypernatremia 02/21/2020  . Hyperammonemia ([place]) 01/30/2020  . Palliative care encounter   . Effusion of right knee 01/27/2020  . Chronic systolic CHF (congestive heart failure) ([place]) 01/27/2020  . Acute on chronic diastolic heart failure ([place]) 01/26/2020  . Goals of care, counseling/discussion 12/17/2019  . MDS (myelodysplastic syndrome) ([place]) 12/16/2019  . Acute on chronic heart failure with preserved ejection fraction (HFpEF) ([place]) 12/10/2019  . AKI (acute kidney injury) ([place]) 12/10/2019  . Cirrhosis ([place])   . Symptomatic anemia 12/07/2019  . Macrocytic anemia 04/16/2019  . S/P TAVR (transcatheter aortic valve replacement)   . Bronchitis 04/17/2017  . Severe aortic stenosis 03/23/2017  . Atrial fibrillation, permanent ([place]) 03/23/2017  . Peripheral vision loss, bilateral   . Osteoarthritis   . Hypertension   . HOH (hard of hearing)   . Heart murmur   . Frequency of urination   . Dyspnea   . Bruises easily   . Diabetes mellitus type 2, uncomplicated ([place]) 86/38/1771  . Essential hypertension 10/15/2015  . 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 Diarrhea 08/24/2015  . Stroke ([place]) 07/25/2015  . Edema of leg 03/20/2015  .  Bradycardia 03/20/2015  . Nocturia 10/24/2014  . Aftercare following surgery of the circulatory system, [place] 06/07/2012  . Occlusion and stenosis of carotid artery without mention of cerebral infarction 04/14/2011  . Shoulder pain 04/14/2011  . 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 Diabetes mellitus ([place]) 02/01/2011  . Coronary artery disease   . Hyperlipidemia   . Hypertensive urgency   . Obesity   . Carotid arterial disease ([place])   . Torn rotator cuff 01/24/2010    Palliative Care Assessment & Plan   Patient Profile: 80 y.o.malewith past medical history of myelodysplastic syndrome followed by oncology with frequent transfusions for severe anemia, systolic heart failure, history of TAVR, permanent A. fib on Eliquis, type 2 diabetes, HTN, CKD 3a,cirrhosis andgoutadmitted on 1/22/2022with septic shock, acute on chronic resp failure 2/2 covid pne.Now intubated. Palliative medicine consulted for [place].   Assessment/Recommendations/Plan   Actively dying  Continue current comfort interventions  Not stable for discharge to another facility  Goals of Care and Additional Recommendations:  Limitations on Scope of Treatment: Full Comfort Care  Code Status:  DNR  Prognosis:   Hours - Days  Discharge Planning:  Anticipated Hospital Death  Care plan was discussed with care team and patient's family.   Thank you for allowing the Palliative Medicine Team to assist in the care of this patient.   Total time: 48 mins Greater than 50%  of this time was spent counseling and coordinating care related to the above assessment and plan.  Mariana Kaufman, AGNP-C Palliative Medicine   Please contact Palliative Medicine Team phone at [phone] for questions and concerns.

## 2020-03-24 DEATH — deceased

## 2020-03-25 ENCOUNTER — Ambulatory Visit: Payer: Medicare Other | Admitting: Cardiology
